# Patient Record
Sex: Male | Born: 1950
Health system: Southern US, Community
[De-identification: ages and names within clinical notes are randomized; demographics above are authoritative.]

## PROBLEM LIST (undated history)

## (undated) DIAGNOSIS — K219 Gastro-esophageal reflux disease without esophagitis: Secondary | ICD-10-CM

## (undated) DIAGNOSIS — M549 Dorsalgia, unspecified: Secondary | ICD-10-CM

## (undated) DIAGNOSIS — R3915 Urgency of urination: Secondary | ICD-10-CM

## (undated) DIAGNOSIS — I10 Essential (primary) hypertension: Secondary | ICD-10-CM

## (undated) DIAGNOSIS — E119 Type 2 diabetes mellitus without complications: Secondary | ICD-10-CM

## (undated) DIAGNOSIS — K759 Inflammatory liver disease, unspecified: Secondary | ICD-10-CM

## (undated) DIAGNOSIS — M2550 Pain in unspecified joint: Secondary | ICD-10-CM

## (undated) DIAGNOSIS — N400 Enlarged prostate without lower urinary tract symptoms: Secondary | ICD-10-CM

## (undated) DIAGNOSIS — R350 Frequency of micturition: Secondary | ICD-10-CM

## (undated) DIAGNOSIS — M1990 Unspecified osteoarthritis, unspecified site: Secondary | ICD-10-CM

## (undated) HISTORY — PX: APPENDECTOMY: SHX54

## (undated) HISTORY — PX: COLONOSCOPY: SHX174

## (undated) HISTORY — PX: VASECTOMY: SHX75

## (undated) NOTE — *Deleted (*Deleted)
Pharmacy Resident Rounding Note - for learning purposes only, not an active part of the chart   S/o  Admit Complaint: found down in bathroom, neg LOC -stopped home meds months ago except torsemide prn swelling -necrotic foot infx >> vanc + cefepime >> augmentin + doxy ---declines surgical tx -IV lasix and metolazone for LEE stopped, no improvement -started dialysis 11/3  Anticoagulation - apix >> IV hep for afib. bleeding from foley & central line - hep restarted 11/9 afternoon Infectious Disease - vanc + cefepime >> augmentin + doxy WBC 10>>12.5 Cardiovascular -SBP ok, ~100-120. HR variable but dips overnight. midodrine 10mg  TID Endocrinology - CBGs <180, SSI wm hs Gastrointestinal / Nutrition - some diarrhea 11/7, on augmentin and doxy  Neurology -trazodone Nephrology Admit SCr 1.98 >> 2 >> remains above 4. Now 6.1. 2L UF 1300 11/10 Baseline SCr 1.2-1.3. 11/8 UOP 0.61ml/kg/hr, ~245mL *foley is removed*  Lytes WNL, phos 9>>6.7, K <4   Pulmonary Hematology / Oncology PTA Medication Issues Best Practices  AKI on CKD stage IIIb, now dialysis dependent Hypotension & diuresis low uop >> foley removed  -HD continues (last session 11/10)  -Tunneled cath and AV fistula placement 11/10    Afib w/ RVR D/cd metop, heparin gtt off, back to eliquis --F/u with hgb after bleeding >> stable  No iron panel this admit - f/u on iron panel   Sepsis w/ osteo -cont abx at discharge, will continue outpatient   snf @ d/c

---

## 1982-11-15 HISTORY — PX: KNEE ARTHROSCOPY: SUR90

## 1991-11-16 HISTORY — PX: VASECTOMY REVERSAL: SHX243

## 2001-11-15 HISTORY — PX: SHOULDER ARTHROSCOPY: SHX128

## 2002-06-05 ENCOUNTER — Ambulatory Visit (HOSPITAL_COMMUNITY): Admission: RE | Admit: 2002-06-05 | Discharge: 2002-06-05 | Payer: Self-pay | Admitting: Sports Medicine

## 2002-06-05 ENCOUNTER — Encounter: Payer: Self-pay | Admitting: Sports Medicine

## 2002-06-05 ENCOUNTER — Ambulatory Visit (HOSPITAL_COMMUNITY): Admission: RE | Admit: 2002-06-05 | Discharge: 2002-06-05 | Payer: Self-pay | Admitting: Neurosurgery

## 2013-02-23 ENCOUNTER — Other Ambulatory Visit: Payer: Self-pay | Admitting: Orthopedic Surgery

## 2013-03-06 ENCOUNTER — Encounter (HOSPITAL_BASED_OUTPATIENT_CLINIC_OR_DEPARTMENT_OTHER): Payer: Self-pay | Admitting: *Deleted
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
## 2013-03-07 ENCOUNTER — Encounter (HOSPITAL_BASED_OUTPATIENT_CLINIC_OR_DEPARTMENT_OTHER)
Admission: RE | Admit: 2013-03-07 | Discharge: 2013-03-07 | Disposition: A | Discharge status: Home / Self Care | Payer: Worker's Compensation | Source: Ambulatory Visit | Attending: Orthopedic Surgery | Admitting: Orthopedic Surgery

## 2013-03-07 ENCOUNTER — Other Ambulatory Visit: Payer: Self-pay

## 2013-03-07 LAB — BASIC METABOLIC PANEL WITH GFR
BUN: 15 mg/dL (ref 6–23)
CO2: 29 meq/L (ref 19–32)
Calcium: 8.8 mg/dL (ref 8.4–10.5)
Chloride: 97 meq/L (ref 96–112)
Creatinine, Ser: 0.81 mg/dL (ref 0.50–1.35)
GFR calc Af Amer: >90 mL/min
GFR calc non Af Amer: >90 mL/min
Glucose, Bld: 321 mg/dL — ABNORMAL HIGH (ref 70–99)
Potassium: 3.7 meq/L (ref 3.5–5.1)
Sodium: 136 meq/L (ref 135–145)
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
## 2013-03-13 ENCOUNTER — Encounter (HOSPITAL_BASED_OUTPATIENT_CLINIC_OR_DEPARTMENT_OTHER): Payer: Self-pay

## 2013-03-13 ENCOUNTER — Encounter (HOSPITAL_BASED_OUTPATIENT_CLINIC_OR_DEPARTMENT_OTHER)
Admission: RE | Disposition: A | Discharge status: Home / Self Care | Payer: Self-pay | Source: Ambulatory Visit | Attending: Orthopedic Surgery

## 2013-03-13 ENCOUNTER — Ambulatory Visit (HOSPITAL_BASED_OUTPATIENT_CLINIC_OR_DEPARTMENT_OTHER): Payer: Worker's Compensation | Admitting: Certified Registered Nurse Anesthetist

## 2013-03-13 ENCOUNTER — Encounter (HOSPITAL_BASED_OUTPATIENT_CLINIC_OR_DEPARTMENT_OTHER): Payer: Self-pay | Admitting: Certified Registered Nurse Anesthetist

## 2013-03-13 ENCOUNTER — Ambulatory Visit (HOSPITAL_BASED_OUTPATIENT_CLINIC_OR_DEPARTMENT_OTHER)
Admission: RE | Admit: 2013-03-13 | Discharge: 2013-03-14 | Disposition: A | Discharge status: Home / Self Care | Payer: Worker's Compensation | Source: Ambulatory Visit | Attending: Orthopedic Surgery | Admitting: Orthopedic Surgery

## 2013-03-13 DIAGNOSIS — M67919 Unspecified disorder of synovium and tendon, unspecified shoulder: Secondary | ICD-10-CM | POA: Insufficient documentation

## 2013-03-13 DIAGNOSIS — M719 Bursopathy, unspecified: Secondary | ICD-10-CM | POA: Insufficient documentation

## 2013-03-13 DIAGNOSIS — I10 Essential (primary) hypertension: Secondary | ICD-10-CM | POA: Insufficient documentation

## 2013-03-13 DIAGNOSIS — M129 Arthropathy, unspecified: Secondary | ICD-10-CM | POA: Insufficient documentation

## 2013-03-13 DIAGNOSIS — M24819 Other specific joint derangements of unspecified shoulder, not elsewhere classified: Secondary | ICD-10-CM | POA: Insufficient documentation

## 2013-03-13 HISTORY — PX: SHOULDER ARTHROSCOPY WITH ROTATOR CUFF REPAIR AND SUBACROMIAL DECOMPRESSION: SHX5686

## 2013-03-13 HISTORY — DX: Unspecified osteoarthritis, unspecified site: M19.90

## 2013-03-13 HISTORY — DX: Essential (primary) hypertension: I10

## 2013-03-13 HISTORY — DX: Gastro-esophageal reflux disease without esophagitis: K21.9

## 2013-03-13 HISTORY — DX: Benign prostatic hyperplasia without lower urinary tract symptoms: N40.0

## 2013-03-13 LAB — GLUCOSE, CAPILLARY
Glucose-Capillary: 211 mg/dL — ABNORMAL HIGH (ref 70–99)
Glucose-Capillary: 192 mg/dL — ABNORMAL HIGH (ref 70–99)

## 2013-03-13 LAB — POCT HEMOGLOBIN-HEMACUE: Hemoglobin: 14.3 g/dL (ref 13.0–17.0)

## 2013-03-13 SURGERY — SHOULDER ARTHROSCOPY WITH ROTATOR CUFF REPAIR AND SUBACROMIAL DECOMPRESSION
Anesthesia: General | Site: Shoulder | Laterality: Left | Wound class: Clean

## 2013-03-13 MED ORDER — ONDANSETRON HCL 4 MG/2ML IJ SOLN: 4.0000 mg | Freq: Once | INTRAMUSCULAR | Status: DC | PRN

## 2013-03-13 MED ORDER — HYDROMORPHONE HCL PF 1 MG/ML IJ SOLN: 0.2500 mg | INTRAMUSCULAR | Status: DC | PRN

## 2013-03-13 MED ORDER — METOCLOPRAMIDE HCL 5 MG PO TABS: 5.0000 mg | ORAL_TABLET | Freq: Three times a day (TID) | ORAL | Status: DC | PRN

## 2013-03-13 MED ORDER — IBUPROFEN 600 MG PO TABS
600.0000 mg | ORAL_TABLET | Freq: Four times a day (QID) | ORAL | Status: DC | PRN
  Administered 2013-03-14: 600 mg via ORAL

## 2013-03-13 MED ORDER — LIDOCAINE HCL (CARDIAC) 20 MG/ML IV SOLN
INTRAVENOUS | Status: DC | PRN
  Administered 2013-03-13: 80 mg via INTRAVENOUS

## 2013-03-13 MED ORDER — ONDANSETRON HCL 4 MG PO TABS: 4.0000 mg | ORAL_TABLET | Freq: Four times a day (QID) | ORAL | Status: DC | PRN

## 2013-03-13 MED ORDER — HYDROMORPHONE HCL 4 MG PO TABS
4.0000 mg | ORAL_TABLET | Freq: Four times a day (QID) | ORAL | Status: DC | PRN
  Administered 2013-03-13 – 2013-03-14 (×3): 4 mg via ORAL

## 2013-03-13 MED ORDER — ONDANSETRON HCL 4 MG/2ML IJ SOLN: 4.0000 mg | Freq: Four times a day (QID) | INTRAMUSCULAR | Status: DC | PRN

## 2013-03-13 MED ORDER — EPINEPHRINE HCL 0.1 MG/ML IJ SOSY
PREFILLED_SYRINGE | INTRAMUSCULAR | Status: DC | PRN
  Administered 2013-03-13: .15 mL via SUBCUTANEOUS

## 2013-03-13 MED ORDER — ONDANSETRON HCL 4 MG/2ML IJ SOLN
INTRAMUSCULAR | Status: DC | PRN
  Administered 2013-03-13: 4 mg via INTRAVENOUS

## 2013-03-13 MED ORDER — ROPIVACAINE HCL 5 MG/ML IJ SOLN
INTRAMUSCULAR | Status: DC | PRN
  Administered 2013-03-13: 12 mL

## 2013-03-13 MED ORDER — CEFAZOLIN SODIUM-DEXTROSE 2-3 GM-% IV SOLR
2.0000 g | INTRAVENOUS | Status: AC
  Administered 2013-03-13: 3 g via INTRAVENOUS

## 2013-03-13 MED ORDER — FENTANYL CITRATE 0.05 MG/ML IJ SOLN
INTRAMUSCULAR | Status: DC | PRN
  Administered 2013-03-13 (×6): 25 ug via INTRAVENOUS
  Administered 2013-03-13: 50 ug via INTRAVENOUS

## 2013-03-13 MED ORDER — OXYCODONE HCL 5 MG PO TABS: 5.0000 mg | ORAL_TABLET | Freq: Once | ORAL | Status: DC | PRN

## 2013-03-13 MED ORDER — HYDROMORPHONE HCL PF 1 MG/ML IJ SOLN
0.5000 mg | INTRAMUSCULAR | Status: DC | PRN
  Administered 2013-03-13: 1 mg via INTRAVENOUS

## 2013-03-13 MED ORDER — OXYCODONE HCL 5 MG/5ML PO SOLN: 5.0000 mg | Freq: Once | ORAL | Status: DC | PRN

## 2013-03-13 MED ORDER — LACTATED RINGERS IV SOLN
INTRAVENOUS | Status: DC
  Administered 2013-03-13 (×2): via INTRAVENOUS

## 2013-03-13 MED ORDER — METHOCARBAMOL 500 MG PO TABS
500.0000 mg | ORAL_TABLET | Freq: Four times a day (QID) | ORAL | Status: DC | PRN
  Administered 2013-03-13 – 2013-03-14 (×2): 500 mg via ORAL

## 2013-03-13 MED ORDER — MIDAZOLAM HCL 2 MG/2ML IJ SOLN
1.0000 mg | INTRAMUSCULAR | Status: DC | PRN
  Administered 2013-03-13: 2 mg via INTRAVENOUS

## 2013-03-13 MED ORDER — LIDOCAINE HCL 4 % MT SOLN
OROMUCOSAL | Status: DC | PRN
  Administered 2013-03-13: 4 mL via TOPICAL

## 2013-03-13 MED ORDER — FENTANYL CITRATE 0.05 MG/ML IJ SOLN
50.0000 ug | INTRAMUSCULAR | Status: DC | PRN
  Administered 2013-03-13: 100 ug via INTRAVENOUS

## 2013-03-13 MED ORDER — SODIUM CHLORIDE 0.9 % IR SOLN
Status: DC | PRN
  Administered 2013-03-13: 13000 mL

## 2013-03-13 MED ORDER — CHLORHEXIDINE GLUCONATE 4 % EX LIQD: 60.0000 mL | Freq: Once | CUTANEOUS | Status: DC

## 2013-03-13 MED ORDER — PROPOFOL 10 MG/ML IV BOLUS
INTRAVENOUS | Status: DC | PRN
  Administered 2013-03-13: 160 mg via INTRAVENOUS

## 2013-03-13 MED ORDER — SUCCINYLCHOLINE CHLORIDE 20 MG/ML IJ SOLN
INTRAMUSCULAR | Status: DC | PRN
  Administered 2013-03-13: 140 mg via INTRAVENOUS

## 2013-03-13 MED ORDER — METHOCARBAMOL 100 MG/ML IJ SOLN: 500.0000 mg | Freq: Four times a day (QID) | INTRAVENOUS | Status: DC | PRN

## 2013-03-13 MED ORDER — CEFAZOLIN SODIUM-DEXTROSE 2-3 GM-% IV SOLR
2.0000 g | Freq: Four times a day (QID) | INTRAVENOUS | Status: AC
  Administered 2013-03-13 – 2013-03-14 (×3): 2 g via INTRAVENOUS

## 2013-03-13 MED ORDER — ROPIVACAINE HCL 5 MG/ML IJ SOLN
INTRAMUSCULAR | Status: DC | PRN
  Administered 2013-03-13: 30 mL

## 2013-03-13 MED ORDER — METOCLOPRAMIDE HCL 5 MG/ML IJ SOLN: 5.0000 mg | Freq: Three times a day (TID) | INTRAMUSCULAR | Status: DC | PRN

## 2013-03-13 MED ORDER — SODIUM CHLORIDE 0.9 % IV SOLN
INTRAVENOUS | Status: DC
  Administered 2013-03-13: 20 mL/h via INTRAVENOUS

## 2013-03-13 SURGICAL SUPPLY — 82 items
ANCHOR BIO SWLOCK 4.75 W/TIG (Anchor) ×2 IMPLANT
ANCHOR PUSHLOCK PEEK 3.5X19.5 (Anchor) ×2 IMPLANT
ANCHOR SUT BIO SW 4.75X19.1 (Anchor) ×4 IMPLANT
BANDAGE ADHESIVE 1X3 (GAUZE/BANDAGES/DRESSINGS) IMPLANT
BLADE AVERAGE 25X9 (BLADE) ×2 IMPLANT
BLADE CUTTER MENIS 5.5 (BLADE) IMPLANT
BLADE SURG 15 STRL LF DISP TIS (BLADE) ×3 IMPLANT
BLADE SURG 15 STRL SS (BLADE) ×3
BNDG COHESIVE 4X5 TAN STRL (GAUZE/BANDAGES/DRESSINGS) ×2 IMPLANT
BUR EGG 3PK/BX (BURR) ×2 IMPLANT
BUR OVAL 6.0 (BURR) ×2 IMPLANT
CANISTER OMNI JUG 16 LITER (MISCELLANEOUS) ×2 IMPLANT
CANISTER SUCTION 2500CC (MISCELLANEOUS) ×2 IMPLANT
CANNULA TWIST IN 8.25X7CM (CANNULA) IMPLANT
CLEANER CAUTERY TIP 5X5 PAD (MISCELLANEOUS) IMPLANT
CLOTH BEACON ORANGE TIMEOUT ST (SAFETY) ×2 IMPLANT
CUTTER MENISCUS  4.2MM (BLADE) ×1
CUTTER MENISCUS 4.2MM (BLADE) ×1 IMPLANT
DECANTER SPIKE VIAL GLASS SM (MISCELLANEOUS) IMPLANT
DRAPE INCISE IOBAN 66X45 STRL (DRAPES) ×2 IMPLANT
DRAPE STERI 35X30 U-POUCH (DRAPES) ×4 IMPLANT
DRAPE SURG 17X23 STRL (DRAPES) ×4 IMPLANT
DRAPE U-SHAPE 47X51 STRL (DRAPES) ×4 IMPLANT
DRAPE U-SHAPE 76X120 STRL (DRAPES) ×4 IMPLANT
DRSG PAD ABDOMINAL 8X10 ST (GAUZE/BANDAGES/DRESSINGS) ×2 IMPLANT
DURAPREP 26ML APPLICATOR (WOUND CARE) ×6 IMPLANT
ELECT REM PT RETURN 9FT ADLT (ELECTROSURGICAL) ×2
ELECTRODE REM PT RTRN 9FT ADLT (ELECTROSURGICAL) ×1 IMPLANT
GLOVE BIO SURGEON STRL SZ 6.5 (GLOVE) ×8 IMPLANT
GLOVE BIOGEL M STRL SZ7.5 (GLOVE) ×4 IMPLANT
GLOVE BIOGEL PI IND STRL 7.0 (GLOVE) ×2 IMPLANT
GLOVE BIOGEL PI IND STRL 8 (GLOVE) ×4 IMPLANT
GLOVE BIOGEL PI INDICATOR 7.0 (GLOVE) ×2
GLOVE BIOGEL PI INDICATOR 8 (GLOVE) ×4
GLOVE EXAM NITRILE EXT CFF LRG (GLOVE) ×2 IMPLANT
GLOVE ORTHO TXT STRL SZ7.5 (GLOVE) ×4 IMPLANT
GOWN BRE IMP PREV XXLGXLNG (GOWN DISPOSABLE) ×4 IMPLANT
GOWN STRL REIN 2XL XLG LVL4 (GOWN DISPOSABLE) ×4 IMPLANT
NDL SUT 6 .5 CRC .975X.05 MAYO (NEEDLE) ×1 IMPLANT
NEEDLE MAYO TAPER (NEEDLE) ×1
NEEDLE MINI RC 24MM (NEEDLE) IMPLANT
NEEDLE SCORPION (NEEDLE) ×2 IMPLANT
PACK ARTHROSCOPY DSU (CUSTOM PROCEDURE TRAY) ×2 IMPLANT
PACK BASIN DAY SURGERY FS (CUSTOM PROCEDURE TRAY) ×2 IMPLANT
PAD CLEANER CAUTERY TIP 5X5 (MISCELLANEOUS)
PASSER SUT SWANSON 36MM LOOP (INSTRUMENTS) IMPLANT
PENCIL BUTTON HOLSTER BLD 10FT (ELECTRODE) ×2 IMPLANT
SLEEVE SCD COMPRESS KNEE MED (MISCELLANEOUS) ×2 IMPLANT
SLING ARM FOAM STRAP LRG (SOFTGOODS) IMPLANT
SLING ARM FOAM STRAP MED (SOFTGOODS) IMPLANT
SPONGE GAUZE 4X4 12PLY (GAUZE/BANDAGES/DRESSINGS) ×2 IMPLANT
SPONGE LAP 4X18 X RAY DECT (DISPOSABLE) ×6 IMPLANT
STOCKINETTE IMPERVIOUS LG (DRAPES) ×2 IMPLANT
STRIP CLOSURE SKIN 1/2X4 (GAUZE/BANDAGES/DRESSINGS) ×2 IMPLANT
SUCTION FRAZIER TIP 10 FR DISP (SUCTIONS) IMPLANT
SUT ETHIBOND 2 OS 4 DA (SUTURE) IMPLANT
SUT ETHILON 4 0 PS 2 18 (SUTURE) IMPLANT
SUT FIBERWIRE #2 38 T-5 BLUE (SUTURE) ×4
SUT FIBERWIRE 3-0 18 TAPR NDL (SUTURE)
SUT PROLENE 1 CT (SUTURE) IMPLANT
SUT PROLENE 3 0 PS 2 (SUTURE) ×4 IMPLANT
SUT TIGER TAPE 7 IN WHITE (SUTURE) IMPLANT
SUT VIC AB 0 CT1 27 (SUTURE) ×2
SUT VIC AB 0 CT1 27XBRD ANBCTR (SUTURE) ×2 IMPLANT
SUT VIC AB 0 SH 27 (SUTURE) IMPLANT
SUT VIC AB 2-0 SH 27 (SUTURE)
SUT VIC AB 2-0 SH 27XBRD (SUTURE) IMPLANT
SUT VIC AB 3-0 SH 27 (SUTURE) ×2
SUT VIC AB 3-0 SH 27X BRD (SUTURE) ×2 IMPLANT
SUT VIC AB 3-0 X1 27 (SUTURE) IMPLANT
SUTURE FIBERWR #2 38 T-5 BLUE (SUTURE) ×2 IMPLANT
SUTURE FIBERWR 3-0 18 TAPR NDL (SUTURE) IMPLANT
SYR 3ML 23GX1 SAFETY (SYRINGE) IMPLANT
SYR BULB 3OZ (MISCELLANEOUS) IMPLANT
TAPE FIBER 2MM 7IN #2 BLUE (SUTURE) ×2 IMPLANT
TAPE PAPER 3X10 WHT MICROPORE (GAUZE/BANDAGES/DRESSINGS) ×2 IMPLANT
TOWEL OR 17X24 6PK STRL BLUE (TOWEL DISPOSABLE) ×2 IMPLANT
TUBE CONNECTING 20X1/4 (TUBING) ×2 IMPLANT
TUBING ARTHROSCOPY IRRIG 16FT (MISCELLANEOUS) ×2 IMPLANT
WAND STAR VAC 90 (SURGICAL WAND) ×2 IMPLANT
WATER STERILE IRR 1000ML POUR (IV SOLUTION) ×2 IMPLANT
YANKAUER SUCT BULB TIP NO VENT (SUCTIONS) IMPLANT

## 2013-03-13 NOTE — Anesthesia Preprocedure Evaluation (Addendum)
Anesthesia Evaluation  Patient identified by MRN, date of birth, ID band Patient awake    Reviewed: Allergy & Precautions, H&P , NPO status , Patient's Chart, lab work & pertinent test results  Airway Mallampati: II TM Distance: >3 FB Neck ROM: Full    Dental  (+) Teeth Intact and Dental Advisory Given   Pulmonary  breath sounds clear to auscultation        Cardiovascular hypertension, Pt. on medications Rhythm:Regular Rate:Normal     Neuro/Psych    GI/Hepatic GERD-  Medicated and Controlled,  Endo/Other  Morbid obesity  Renal/GU      Musculoskeletal   Abdominal   Peds  Hematology   Anesthesia Other Findings   Reproductive/Obstetrics                          Anesthesia Physical Anesthesia Plan  ASA: III  Anesthesia Plan: General   Post-op Pain Management:    Induction: Intravenous  Airway Management Planned: Oral ETT  Additional Equipment:   Intra-op Plan:   Post-operative Plan: Extubation in OR  Informed Consent: I have reviewed the patients History and Physical, chart, labs and discussed the procedure including the risks, benefits and alternatives for the proposed anesthesia with the patient or authorized representative who has indicated his/her understanding and acceptance.   Dental advisory given  Plan Discussed with: CRNA, Anesthesiologist and Surgeon  Anesthesia Plan Comments:         Anesthesia Quick Evaluation

## 2013-03-13 NOTE — Progress Notes (Signed)
Dr. Al Corpus notified of occasional PVC's /irregular rhythm- EKG strip printed. Dr. Al Corpus  to PACU to review EKG strip- will continue to monitor pt. as per Dr. Al Corpus. Pt. with VSS at present.

## 2013-03-13 NOTE — Transfer of Care (Signed)
Immediate Anesthesia Transfer of Care Note  Patient: [name]  Procedure(s) Performed: Procedure(s): LEFT SHOULDER ARTHROSCOPY WITH SUBACROMIAL DECOMPRESSION, DISTAL CLAVICLE RESECTION AND REPAIR ROTATOR CUFF AND LABRAL DEBRIDEMENT (Left)  Patient Location: PACU  Anesthesia Type:GA combined with regional for post-op pain  Level of Consciousness: sedated and patient cooperative  Airway & Oxygen Therapy: Patient Spontanous Breathing and Patient connected to face mask oxygen  Post-op Assessment: Report given to PACU RN and Post -op Vital signs reviewed and stable  Post vital signs: Reviewed and stable  Complications: No apparent anesthesia complications

## 2013-03-13 NOTE — OR Nursing (Signed)
Dr. Daylene Katayama unable to ascess joint with current draping.  Patient reprepped and draped at 1110am.  When reprepping, saw bluish capillary petechiae on underside of left upper arm.  Dr. Daylene Katayama aware and noted it was probably from the 6650 steri- drape.

## 2013-03-13 NOTE — Anesthesia Procedure Notes (Addendum)
Anesthesia Regional Block:  Supraclavicular block  Pre-Anesthetic Checklist: ,, timeout performed, Correct Patient, Correct Site, Correct Laterality, Correct Procedure, Correct Position, site marked, Risks and benefits discussed,  Surgical consent,  Pre-op evaluation,  At surgeon's request and post-op pain management  Laterality: Left and Upper  Prep: chloraprep       Needles:  Injection technique: Single-shot  Needle Type: Echogenic Stimulator Needle     Needle Length: 5cm 5 cm Needle Gauge: 21    Additional Needles:  Procedures: ultrasound guided (picture in chart) Supraclavicular block Narrative:  Start time: 03/13/2013 9:56 AM End time: 03/13/2013 10:06 AM Injection made incrementally with aspirations every 5 mL.  Performed by: Personally  Anesthesiologist: Lorrene Reid  Supraclavicular block Procedure Name: Intubation Date/Time: 03/13/2013 10:44 AM Performed by: Denny Levy Pre-anesthesia Checklist: Patient identified, Emergency Drugs available, Suction available, Patient being monitored and Timeout performed Patient Re-evaluated:Patient Re-evaluated prior to inductionOxygen Delivery Method: Circle System Utilized Preoxygenation: Pre-oxygenation with 100% oxygen Intubation Type: IV induction Ventilation: Mask ventilation without difficulty Laryngoscope Size: Miller and 3 Grade View: Grade II Tube type: Oral Tube size: 8.0 mm Number of attempts: 2 (more curve needed with stylet) Airway Equipment and Method: stylet and oral airway Placement Confirmation: ETT inserted through vocal cords under direct vision,  positive ETCO2 and breath sounds checked- equal and bilateral Secured at: 21 cm Tube secured with: Tape Dental Injury: Teeth and Oropharynx as per pre-operative assessment

## 2013-03-13 NOTE — Anesthesia Postprocedure Evaluation (Signed)
  Anesthesia Post-op Note  Patient: [name]  Procedure(s) Performed: Procedure(s): LEFT SHOULDER ARTHROSCOPY WITH SUBACROMIAL DECOMPRESSION, DISTAL CLAVICLE RESECTION AND REPAIR ROTATOR CUFF AND LABRAL DEBRIDEMENT (Left)  Patient Location: PACU  Anesthesia Type:GA combined with regional for post-op pain  Level of Consciousness: awake, alert  and oriented  Airway and Oxygen Therapy: Patient Spontanous Breathing and Patient connected to face mask oxygen  Post-op Pain: none  Post-op Assessment: Post-op Vital signs reviewed  Post-op Vital Signs: Reviewed  Complications: No apparent anesthesia complications

## 2013-03-13 NOTE — Progress Notes (Signed)
Assisted Dr. Crews with left, ultrasound guided, interscalene  block. Side rails up, monitors on throughout procedure. See vital signs in flow sheet. Tolerated Procedure well. 

## 2013-03-13 NOTE — Brief Op Note (Signed)
03/13/2013  1:56 PM  PATIENT:  [name]  62 y.o. male  PRE-OPERATIVE DIAGNOSIS:  LEFT ROTATOR CUFF TEAR A-C DEGENERATIVE JOINT DISEASE  POST-OPERATIVE DIAGNOSIS:  LEFT ROTATOR CUFF TEAR A-C DEGENERATIVE JOINT DISEASE  PROCEDURE:  Procedure(s): LEFT SHOULDER ARTHROSCOPY WITH SUBACROMIAL DECOMPRESSION, DISTAL CLAVICLE RESECTION AND REPAIR ROTATOR CUFF AND LABRAL DEBRIDEMENT (Left)  SURGEON:  Surgeon(s) and Role:    * [name]., MD - Primary  PHYSICIAN ASSISTANT:   ASSISTANTS:Neeti Knudtson Dasnoit,P.A-C   ANESTHESIA:   general  EBL:  Total I/O In: 1000 [I.V.:1000] Out: -   BLOOD ADMINISTERED:none  DRAINS: none   LOCAL MEDICATIONS USED: ropivacaine suprascapular nerve block and wound margin block  SPECIMEN:  No Specimen  DISPOSITION OF SPECIMEN:  N/A  COUNTS:  YES  TOURNIQUET:  * No tourniquets in log *  DICTATION: .Other Dictation: Dictation Number [id number]  PLAN OF CARE: Admit for overnight observation  PATIENT DISPOSITION:  PACU - hemodynamically stable.   Delay start of Pharmacological VTE agent (>24hrs) due to surgical blood loss or risk of bleeding: not applicable

## 2013-03-14 ENCOUNTER — Encounter (HOSPITAL_BASED_OUTPATIENT_CLINIC_OR_DEPARTMENT_OTHER): Payer: Self-pay | Admitting: Orthopedic Surgery
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
## 2013-03-19 DIAGNOSIS — E119 Type 2 diabetes mellitus without complications: Secondary | ICD-10-CM | POA: Insufficient documentation

## 2013-03-19 DIAGNOSIS — I10 Essential (primary) hypertension: Secondary | ICD-10-CM | POA: Insufficient documentation

## 2013-04-24 ENCOUNTER — Telehealth: Payer: Self-pay | Admitting: General Practice

## 2013-04-25 MED ORDER — LOSARTAN POTASSIUM 100 MG PO TABS: 100.0000 mg | ORAL_TABLET | Freq: Every day | ORAL | Status: DC

## 2013-04-25 MED ORDER — HYDROCHLOROTHIAZIDE 25 MG PO TABS: 25.0000 mg | ORAL_TABLET | Freq: Every day | ORAL | Status: DC

## 2013-04-25 NOTE — Telephone Encounter (Signed)
Rx Refilled  

## 2013-07-26 ENCOUNTER — Other Ambulatory Visit: Payer: Self-pay | Admitting: Family Medicine

## 2013-07-26 NOTE — Telephone Encounter (Signed)
Called patient.  Has not been in office in well over one year.  Did not want to make appointment at this time.  Also stated was not out of his medication yet.  Refills denied pending appointment.  Patient aware of this.

## 2013-09-27 ENCOUNTER — Encounter: Payer: Self-pay | Admitting: Family Medicine

## 2013-09-27 ENCOUNTER — Other Ambulatory Visit: Payer: Self-pay | Admitting: Family Medicine

## 2013-12-03 ENCOUNTER — Other Ambulatory Visit: Payer: Self-pay | Admitting: Family Medicine

## 2013-12-17 DIAGNOSIS — N529 Male erectile dysfunction, unspecified: Secondary | ICD-10-CM | POA: Insufficient documentation

## 2013-12-17 DIAGNOSIS — R6882 Decreased libido: Secondary | ICD-10-CM | POA: Insufficient documentation

## 2013-12-17 DIAGNOSIS — R972 Elevated prostate specific antigen [PSA]: Secondary | ICD-10-CM | POA: Insufficient documentation

## 2013-12-17 DIAGNOSIS — N320 Bladder-neck obstruction: Secondary | ICD-10-CM | POA: Insufficient documentation

## 2014-02-13 ENCOUNTER — Other Ambulatory Visit: Payer: Self-pay | Admitting: Family Medicine

## 2014-02-13 ENCOUNTER — Encounter: Payer: Self-pay | Admitting: Family Medicine

## 2014-06-17 DIAGNOSIS — N400 Enlarged prostate without lower urinary tract symptoms: Secondary | ICD-10-CM | POA: Insufficient documentation

## 2014-08-27 ENCOUNTER — Other Ambulatory Visit: Payer: Self-pay | Admitting: Family Medicine

## 2014-08-27 ENCOUNTER — Ambulatory Visit
Admission: RE | Admit: 2014-08-27 | Discharge: 2014-08-27 | Disposition: A | Discharge status: Home / Self Care | Payer: 59 | Source: Ambulatory Visit | Attending: Family Medicine | Admitting: Family Medicine

## 2014-08-27 DIAGNOSIS — I10 Essential (primary) hypertension: Secondary | ICD-10-CM

## 2015-01-14 ENCOUNTER — Other Ambulatory Visit: Payer: Self-pay | Admitting: Physician Assistant

## 2015-01-14 NOTE — H&P (Signed)
TOTAL HIP ADMISSION H&P  Patient is admitted for right total hip arthroplasty.  Subjective:  Chief Complaint: right hip pain  HPI: [name], 64 y.o. male, has a history of pain and functional disability in the right hip(s) due to arthritis and patient has failed non-surgical conservative treatments for greater than 12 weeks to include NSAID's and/or analgesics, corticosteriod injections and activity modification.  Onset of symptoms was gradual starting 5 years ago with rapidlly worsening course since that time.The patient noted no past surgery on the right hip(s).  Patient currently rates pain in the right hip at 6 out of 10 with activity. Patient has night pain, worsening of pain with activity and weight bearing, trendelenberg gait, pain that interfers with activities of daily living and pain with passive range of motion. Patient has evidence of subchondral sclerosis and joint space narrowing by imaging studies. This condition presents safety issues increasing the risk of falls.  There is no current active infection.  There are no active problems to display for this patient.  Past Medical History  Diagnosis Date  . Shortness of breath     DOE  . Hypertension   . BPH (benign prostatic hypertrophy)   . Arthritis   . Complication of anesthesia     had SOB post op shoulder 2003  . GERD (gastroesophageal reflux disease)     no meds  . Snores     Past Surgical History  Procedure Laterality Date  . Appendectomy      age 34  . Knee arthroscopy  1984    right and left   . Vasectomy    . Vasectomy reversal  1993  . Colonoscopy    . Shoulder arthroscopy  2003    right RCR-GSC-stayed overnight-had SOB  . Shoulder arthroscopy with rotator cuff repair and subacromial decompression Left 03/13/2013    Procedure: LEFT SHOULDER ARTHROSCOPY WITH SUBACROMIAL DECOMPRESSION, DISTAL CLAVICLE RESECTION AND REPAIR ROTATOR CUFF AND LABRAL DEBRIDEMENT;  Surgeon: [name]., MD;  Location:  [facility];  Service: Orthopedics;  Laterality: Left;     (Not in a hospital admission) Allergies  Allergen Reactions  . Codeine Swelling    History  Substance Use Topics  . Smoking status: Former Smoker    Quit date: 03/06/1958  . Smokeless tobacco: Not on file  . Alcohol Use: Yes     Comment: occ    No family history on file.   Review of Systems  Constitutional: Negative.   HENT: Negative.   Eyes: Negative.   Respiratory: Negative.   Cardiovascular: Negative.   Gastrointestinal: Negative.   Genitourinary: Negative.   Musculoskeletal: Positive for joint pain.  Skin: Negative.   Neurological: Negative.   Endo/Heme/Allergies: Negative.   Psychiatric/Behavioral: Negative.     Objective:  Physical Exam  Constitutional: He is oriented to person, place, and time. He appears well-developed and well-nourished.  HENT:  Head: Normocephalic and atraumatic.  Eyes: EOM are normal. Pupils are equal, round, and reactive to light.  Neck: Normal range of motion. Neck supple.  Cardiovascular: Normal rate, regular rhythm and normal heart sounds.   Respiratory: Effort normal and breath sounds normal. No respiratory distress. He has no wheezes. He has no rales.  GI: Soft. Bowel sounds are normal. He exhibits no distension.  Musculoskeletal:  Antalgic Trendelenburg gait on the right. Positive logroll.  Negative straight leg raise.  Neurovascularly intact distally.    Neurological: He is alert and oriented to person, place, and time.  Skin: Skin is warm and dry.  Psychiatric: He has a normal mood and affect. His behavior is normal. Judgment and thought content normal.    Vital signs in last 24 hours: @VSRANGES @  Labs:   Estimated body mass index is 44.12 kg/(m^2) as calculated from the following:   Height as of 03/13/13: 5\' 10"  (1.778 m).   Weight as of 03/13/13: 139.481 kg (307 lb 8 oz).   Imaging Review Plain radiographs demonstrate severe degenerative joint  disease of the right hip(s). The bone quality appears to be fair for age and reported activity level.  Assessment/Plan:  End stage arthritis, right hip(s)  The patient history, physical examination, clinical judgement of the provider and imaging studies are consistent with end stage degenerative joint disease of the right hip(s) and total hip arthroplasty is deemed medically necessary. The treatment options including medical management, injection therapy, arthroscopy and arthroplasty were discussed at length. The risks and benefits of total hip arthroplasty were presented and reviewed. The risks due to aseptic loosening, infection, stiffness, dislocation/subluxation,  thromboembolic complications and other imponderables were discussed.  The patient acknowledged the explanation, agreed to proceed with the plan and consent was signed. Patient is being admitted for inpatient treatment for surgery, pain control, PT, OT, prophylactic antibiotics, VTE prophylaxis, progressive ambulation and ADL's and discharge planning.The patient is planning to be discharged home with home health services

## 2015-01-17 ENCOUNTER — Encounter (HOSPITAL_COMMUNITY)
Admission: RE | Admit: 2015-01-17 | Discharge: 2015-01-17 | Disposition: A | Discharge status: Home / Self Care | Payer: 59 | Source: Ambulatory Visit | Attending: Orthopedic Surgery | Admitting: Orthopedic Surgery

## 2015-01-17 ENCOUNTER — Encounter (HOSPITAL_COMMUNITY): Payer: Self-pay

## 2015-01-17 DIAGNOSIS — Z01818 Encounter for other preprocedural examination: Secondary | ICD-10-CM | POA: Insufficient documentation

## 2015-01-17 DIAGNOSIS — Z0183 Encounter for blood typing: Secondary | ICD-10-CM | POA: Diagnosis not present

## 2015-01-17 DIAGNOSIS — M1611 Unilateral primary osteoarthritis, right hip: Secondary | ICD-10-CM | POA: Diagnosis not present

## 2015-01-17 DIAGNOSIS — I10 Essential (primary) hypertension: Secondary | ICD-10-CM | POA: Insufficient documentation

## 2015-01-17 DIAGNOSIS — Z01812 Encounter for preprocedural laboratory examination: Secondary | ICD-10-CM | POA: Diagnosis not present

## 2015-01-17 DIAGNOSIS — K219 Gastro-esophageal reflux disease without esophagitis: Secondary | ICD-10-CM | POA: Insufficient documentation

## 2015-01-17 HISTORY — DX: Pain in unspecified joint: M25.50

## 2015-01-17 HISTORY — DX: Inflammatory liver disease, unspecified: K75.9

## 2015-01-17 HISTORY — DX: Urgency of urination: R39.15

## 2015-01-17 HISTORY — DX: Frequency of micturition: R35.0

## 2015-01-17 HISTORY — DX: Dorsalgia, unspecified: M54.9

## 2015-01-17 HISTORY — DX: Type 2 diabetes mellitus without complications: E11.9

## 2015-01-17 LAB — COMPREHENSIVE METABOLIC PANEL
ALT: 32 U/L (ref 0–53)
AST: 28 U/L (ref 0–37)
Albumin: 3.9 g/dL (ref 3.5–5.2)
Alkaline Phosphatase: 73 U/L (ref 39–117)
Anion gap: 8 (ref 5–15)
BUN: 19 mg/dL (ref 6–23)
CO2: 27 mmol/L (ref 19–32)
Calcium: 8.8 mg/dL (ref 8.4–10.5)
Chloride: 103 mmol/L (ref 96–112)
Creatinine, Ser: 0.96 mg/dL (ref 0.50–1.35)
GFR calc Af Amer: >90 mL/min (ref >90)
GFR calc non Af Amer: 85 mL/min — ABNORMAL LOW (ref >90)
Glucose, Bld: 142 mg/dL — ABNORMAL HIGH (ref 70–99)
Potassium: 3.7 mmol/L (ref 3.5–5.1)
Sodium: 138 mmol/L (ref 135–145)
Total Bilirubin: 1 mg/dL (ref 0.3–1.2)
Total Protein: 6.9 g/dL (ref 6.0–8.3)

## 2015-01-17 LAB — PROTIME-INR
INR: 0.99 (ref 0.00–1.49)
Prothrombin Time: 13.2 seconds (ref 11.6–15.2)

## 2015-01-17 LAB — CBC WITH DIFFERENTIAL/PLATELET
Basophils Absolute: 0 10*3/uL (ref 0.0–0.1)
Basophils Relative: 0 % (ref 0–1)
Eosinophils Absolute: 0.2 10*3/uL (ref 0.0–0.7)
Eosinophils Relative: 2 % (ref 0–5)
HCT: 44.4 % (ref 39.0–52.0)
Hemoglobin: 15.4 g/dL (ref 13.0–17.0)
Lymphocytes Relative: 24 % (ref 12–46)
Lymphs Abs: 2.4 10*3/uL (ref 0.7–4.0)
MCH: 31 pg (ref 26.0–34.0)
MCHC: 34.7 g/dL (ref 30.0–36.0)
MCV: 89.5 fL (ref 78.0–100.0)
Monocytes Absolute: 0.9 10*3/uL (ref 0.1–1.0)
Monocytes Relative: 10 % (ref 3–12)
Neutro Abs: 6.3 10*3/uL (ref 1.7–7.7)
Neutrophils Relative %: 64 % (ref 43–77)
Platelets: 240 10*3/uL (ref 150–400)
RBC: 4.96 MIL/uL (ref 4.22–5.81)
RDW: 13.2 % (ref 11.5–15.5)
WBC: 9.8 10*3/uL (ref 4.0–10.5)

## 2015-01-17 LAB — SURGICAL PCR SCREEN
MRSA, PCR: NEGATIVE
Staphylococcus aureus: NEGATIVE

## 2015-01-17 LAB — URINALYSIS, ROUTINE W REFLEX MICROSCOPIC
Bilirubin Urine: NEGATIVE
Glucose, UA: NEGATIVE mg/dL
Hgb urine dipstick: NEGATIVE
Ketones, ur: NEGATIVE mg/dL
Leukocytes, UA: NEGATIVE
Nitrite: NEGATIVE
Protein, ur: NEGATIVE mg/dL
Specific Gravity, Urine: 1.024 (ref 1.005–1.030)
Urobilinogen, UA: 0.2 mg/dL (ref 0.0–1.0)
pH: 5 (ref 5.0–8.0)

## 2015-01-17 LAB — APTT: aPTT: 27 seconds (ref 24–37)

## 2015-01-17 LAB — TYPE AND SCREEN
ABO/RH(D): O POS
Antibody Screen: NEGATIVE

## 2015-01-17 LAB — ABO/RH: ABO/RH(D): O POS

## 2015-01-17 MED ORDER — CHLORHEXIDINE GLUCONATE 4 % EX LIQD: 60.0000 mL | Freq: Once | CUTANEOUS | Status: DC

## 2015-01-17 NOTE — Pre-Procedure Instructions (Signed)
[place]  01/17/2015   Your procedure is scheduled on:  Wed, Mar 16 @ 1:00 PM  Report to [facility]  at 11:00 AM.  Call this number if you have problems the morning of surgery: [phone]   Remember:   Do not eat food or drink liquids after midnight.   Take these medicines the morning of surgery with A SIP OF WATER: Uroxatral(Alfuzosin)              No Goody's,BC's,Aleve,Aspirin,Ibuprofen,Fish Oil,or any Herbal Medications.    Do not wear jewelry.  Do not wear lotions, powders, or colognes. You may wear deodorant.  Men may shave face and neck.  Do not bring valuables to the hospital.  [facility] is not responsible                  for any belongings or valuables.               Contacts, dentures or bridgework may not be worn into surgery.  Leave suitcase in the car. After surgery it may be brought to your room.  For patients admitted to the hospital, discharge time is determined by your                treatment team.                Special Instructions:  [place] - Preparing for Surgery  Before surgery, you can play an important role.  Because skin is not sterile, your skin needs to be as free of germs as possible.  You can reduce the number of germs on you skin by washing with CHG (chlorahexidine gluconate) soap before surgery.  CHG is an antiseptic cleaner which kills germs and bonds with the skin to continue killing germs even after washing.  Please DO NOT use if you have an allergy to CHG or antibacterial soaps.  If your skin becomes reddened/irritated stop using the CHG and inform your nurse when you arrive at Short Stay.  Do not shave (including legs and underarms) for at least 48 hours prior to the first CHG shower.  You may shave your face.  Please follow these instructions carefully:   1.  Shower with CHG Soap the night before surgery and the                                morning of Surgery.  2.  If you choose to wash your hair, wash your hair first  as usual with your       normal shampoo.  3.  After you shampoo, rinse your hair and body thoroughly to remove the                      Shampoo.  4.  Use CHG as you would any other liquid soap.  You can apply chg directly       to the skin and wash gently with scrungie or a clean washcloth.  5.  Apply the CHG Soap to your body ONLY FROM THE NECK DOWN.        Do not use on open wounds or open sores.  Avoid contact with your eyes,       ears, mouth and genitals (private parts).  Wash genitals (private parts)       with your normal soap.  6.  Wash thoroughly, paying special attention to  the area where your surgery        will be performed.  7.  Thoroughly rinse your body with warm water from the neck down.  8.  DO NOT shower/wash with your normal soap after using and rinsing off       the CHG Soap.  9.  Pat yourself dry with a clean towel.            10.  Wear clean pajamas.            11.  Place clean sheets on your bed the night of your first shower and do not        sleep with pets.  Day of Surgery  Do not apply any lotions/deoderants the morning of surgery.  Please wear clean clothes to the hospital/surgery center.     Please read over the following fact sheets that you were given: Pain Booklet, Coughing and Deep Breathing, Blood Transfusion Information, MRSA Information and Surgical Site Infection Prevention

## 2015-01-17 NOTE — Progress Notes (Addendum)
CXR in epic from 08-27-14   Pt doesn't have a cardiologist   Medical MD is Dr.Robert Sheryn Bison with [facility]  Denies ever having a heart cath/stress test/echo  EKG done in Oct 2015-to request from Medical MD

## 2015-01-17 NOTE — Progress Notes (Signed)
   01/17/15 1030  OBSTRUCTIVE SLEEP APNEA  Have you ever been diagnosed with sleep apnea through a sleep study? No  Do you snore loudly (loud enough to be heard through closed doors)?  0  Do you often feel tired, fatigued, or sleepy during the daytime? 0  Has anyone observed you stop breathing during your sleep? 0  Do you have, or are you being treated for high blood pressure? 1  BMI more than 35 kg/m2? 1  Age over 66 years old? 1  Neck circumference greater than 40 cm/16 inches? 1  Gender: 1

## 2015-01-21 LAB — URINE CULTURE: Colony Count: 25000

## 2015-01-28 MED ORDER — TRANEXAMIC ACID 100 MG/ML IV SOLN
1000.0000 mg | INTRAVENOUS | Status: AC
  Administered 2015-01-29: 1000 mg via INTRAVENOUS
  Filled 2015-01-28: qty 10

## 2015-01-28 MED ORDER — DEXTROSE 5 % IV SOLN
3.0000 g | INTRAVENOUS | Status: DC
  Filled 2015-01-28: qty 3000

## 2015-01-28 MED ORDER — LACTATED RINGERS IV SOLN
INTRAVENOUS | Status: DC
  Administered 2015-01-29: 12:00:00 via INTRAVENOUS

## 2015-01-28 NOTE — Anesthesia Preprocedure Evaluation (Signed)
Anesthesia Evaluation  Patient identified by MRN, date of birth, ID band Patient awake    Reviewed: Allergy & Precautions, NPO status , Patient's Chart, lab work & pertinent test results  Airway        Dental   Pulmonary neg pulmonary ROS, Current Smoker,          Cardiovascular hypertension, Pt. on medications  EKG LBB   Neuro/Psych    GI/Hepatic GERD-  Medicated,  Endo/Other  diabetes, Type 2  Renal/GU      Musculoskeletal   Abdominal   Peds  Hematology 15/44   Anesthesia Other Findings   Reproductive/Obstetrics                             Anesthesia Physical Anesthesia Plan  ASA: III  Anesthesia Plan: General   Post-op Pain Management:    Induction: Intravenous  Airway Management Planned: Oral ETT  Additional Equipment:   Intra-op Plan:   Post-operative Plan: Extubation in OR  Informed Consent: I have reviewed the patients History and Physical, chart, labs and discussed the procedure including the risks, benefits and alternatives for the proposed anesthesia with the patient or authorized representative who has indicated his/her understanding and acceptance.     Plan Discussed with:   Anesthesia Plan Comments: (Multimodal pain RX, Glide available)        Anesthesia Quick Evaluation

## 2015-01-29 ENCOUNTER — Encounter (HOSPITAL_COMMUNITY)
Admission: RE | Disposition: A | Discharge status: Home Health | Payer: Self-pay | Source: Ambulatory Visit | Attending: Orthopedic Surgery

## 2015-01-29 ENCOUNTER — Inpatient Hospital Stay (HOSPITAL_COMMUNITY): Payer: 59 | Admitting: Anesthesiology

## 2015-01-29 ENCOUNTER — Encounter (HOSPITAL_COMMUNITY): Payer: Self-pay | Admitting: Certified Registered Nurse Anesthetist

## 2015-01-29 ENCOUNTER — Inpatient Hospital Stay (HOSPITAL_COMMUNITY)
Admission: RE | Admit: 2015-01-29 | Discharge: 2015-01-31 | DRG: 470 | Disposition: A | Discharge status: Home Health | Payer: 59 | Source: Ambulatory Visit | Attending: Orthopedic Surgery | Admitting: Orthopedic Surgery

## 2015-01-29 ENCOUNTER — Inpatient Hospital Stay (HOSPITAL_COMMUNITY): Payer: 59

## 2015-01-29 DIAGNOSIS — K219 Gastro-esophageal reflux disease without esophagitis: Secondary | ICD-10-CM | POA: Diagnosis present

## 2015-01-29 DIAGNOSIS — Z6841 Body Mass Index (BMI) 40.0 and over, adult: Secondary | ICD-10-CM | POA: Diagnosis not present

## 2015-01-29 DIAGNOSIS — E119 Type 2 diabetes mellitus without complications: Secondary | ICD-10-CM | POA: Diagnosis present

## 2015-01-29 DIAGNOSIS — Z87891 Personal history of nicotine dependence: Secondary | ICD-10-CM

## 2015-01-29 DIAGNOSIS — D62 Acute posthemorrhagic anemia: Secondary | ICD-10-CM | POA: Diagnosis not present

## 2015-01-29 DIAGNOSIS — Z7901 Long term (current) use of anticoagulants: Secondary | ICD-10-CM | POA: Diagnosis not present

## 2015-01-29 DIAGNOSIS — Z79899 Other long term (current) drug therapy: Secondary | ICD-10-CM

## 2015-01-29 DIAGNOSIS — R3915 Urgency of urination: Secondary | ICD-10-CM | POA: Diagnosis present

## 2015-01-29 DIAGNOSIS — I10 Essential (primary) hypertension: Secondary | ICD-10-CM | POA: Diagnosis present

## 2015-01-29 DIAGNOSIS — N401 Enlarged prostate with lower urinary tract symptoms: Secondary | ICD-10-CM | POA: Diagnosis present

## 2015-01-29 DIAGNOSIS — M1611 Unilateral primary osteoarthritis, right hip: Principal | ICD-10-CM | POA: Diagnosis present

## 2015-01-29 DIAGNOSIS — Z96641 Presence of right artificial hip joint: Secondary | ICD-10-CM

## 2015-01-29 DIAGNOSIS — M25551 Pain in right hip: Secondary | ICD-10-CM | POA: Diagnosis present

## 2015-01-29 DIAGNOSIS — M1610 Unilateral primary osteoarthritis, unspecified hip: Secondary | ICD-10-CM | POA: Diagnosis present

## 2015-01-29 HISTORY — PX: TOTAL HIP ARTHROPLASTY: SHX124

## 2015-01-29 LAB — GLUCOSE, CAPILLARY
Glucose-Capillary: 127 mg/dL — ABNORMAL HIGH (ref 70–99)
Glucose-Capillary: 149 mg/dL — ABNORMAL HIGH (ref 70–99)
Glucose-Capillary: 203 mg/dL — ABNORMAL HIGH (ref 70–99)

## 2015-01-29 LAB — CBC
HCT: 37.7 % — ABNORMAL LOW (ref 39.0–52.0)
Hemoglobin: 12.9 g/dL — ABNORMAL LOW (ref 13.0–17.0)
MCH: 30.4 pg (ref 26.0–34.0)
MCHC: 34.2 g/dL (ref 30.0–36.0)
MCV: 88.7 fL (ref 78.0–100.0)
Platelets: 211 10*3/uL (ref 150–400)
RBC: 4.25 MIL/uL (ref 4.22–5.81)
RDW: 13 % (ref 11.5–15.5)
WBC: 19.3 10*3/uL — ABNORMAL HIGH (ref 4.0–10.5)

## 2015-01-29 LAB — CREATININE, SERUM
Creatinine, Ser: 1.05 mg/dL (ref 0.50–1.35)
GFR calc Af Amer: 84 mL/min — ABNORMAL LOW (ref >90)
GFR calc non Af Amer: 73 mL/min — ABNORMAL LOW (ref >90)

## 2015-01-29 SURGERY — ARTHROPLASTY, HIP, TOTAL, ANTERIOR APPROACH
Anesthesia: General | Laterality: Right

## 2015-01-29 MED ORDER — ONDANSETRON HCL 4 MG/2ML IJ SOLN: 4.0000 mg | Freq: Four times a day (QID) | INTRAMUSCULAR | Status: DC | PRN

## 2015-01-29 MED ORDER — PROPOFOL 10 MG/ML IV BOLUS
INTRAVENOUS | Status: DC | PRN
  Administered 2015-01-29: 180 mg via INTRAVENOUS

## 2015-01-29 MED ORDER — GLYCOPYRROLATE 0.2 MG/ML IJ SOLN
INTRAMUSCULAR | Status: AC
  Filled 2015-01-29: qty 4

## 2015-01-29 MED ORDER — METOCLOPRAMIDE HCL 5 MG/ML IJ SOLN: 5.0000 mg | Freq: Three times a day (TID) | INTRAMUSCULAR | Status: DC | PRN

## 2015-01-29 MED ORDER — ENOXAPARIN SODIUM 40 MG/0.4ML ~~LOC~~ SOLN: 40.0000 mg | SUBCUTANEOUS | Status: DC

## 2015-01-29 MED ORDER — PHENOL 1.4 % MT LIQD: 1.0000 | OROMUCOSAL | Status: DC | PRN

## 2015-01-29 MED ORDER — ENOXAPARIN SODIUM 40 MG/0.4ML ~~LOC~~ SOLN
40.0000 mg | SUBCUTANEOUS | Status: DC
  Administered 2015-01-30 – 2015-01-31 (×2): 40 mg via SUBCUTANEOUS
  Filled 2015-01-29 (×2): qty 0.4

## 2015-01-29 MED ORDER — SENNOSIDES-DOCUSATE SODIUM 8.6-50 MG PO TABS
1.0000 | ORAL_TABLET | Freq: Every evening | ORAL | Status: DC | PRN
  Administered 2015-01-31: 1 via ORAL
  Filled 2015-01-29: qty 1

## 2015-01-29 MED ORDER — HYDROMORPHONE HCL 1 MG/ML IJ SOLN
INTRAMUSCULAR | Status: AC
  Filled 2015-01-29: qty 1

## 2015-01-29 MED ORDER — OXYCODONE-ACETAMINOPHEN 5-325 MG PO TABS: 1.0000 | ORAL_TABLET | ORAL | Status: DC | PRN

## 2015-01-29 MED ORDER — MIDAZOLAM HCL 5 MG/5ML IJ SOLN
INTRAMUSCULAR | Status: DC | PRN
  Administered 2015-01-29: 2 mg via INTRAVENOUS

## 2015-01-29 MED ORDER — NEOSTIGMINE METHYLSULFATE 10 MG/10ML IV SOLN
INTRAVENOUS | Status: DC | PRN
  Administered 2015-01-29: 5 mg via INTRAVENOUS

## 2015-01-29 MED ORDER — MENTHOL 3 MG MT LOZG: 1.0000 | LOZENGE | OROMUCOSAL | Status: DC | PRN

## 2015-01-29 MED ORDER — LOSARTAN POTASSIUM 50 MG PO TABS
100.0000 mg | ORAL_TABLET | Freq: Every day | ORAL | Status: DC
  Administered 2015-01-29 – 2015-01-31 (×3): 100 mg via ORAL
  Filled 2015-01-29 (×3): qty 2

## 2015-01-29 MED ORDER — INSULIN ASPART 100 UNIT/ML ~~LOC~~ SOLN: 0.0000 [IU] | Freq: Three times a day (TID) | SUBCUTANEOUS | Status: DC

## 2015-01-29 MED ORDER — METFORMIN HCL 500 MG PO TABS
500.0000 mg | ORAL_TABLET | Freq: Every day | ORAL | Status: DC
  Administered 2015-01-30 – 2015-01-31 (×2): 500 mg via ORAL
  Filled 2015-01-29 (×2): qty 1

## 2015-01-29 MED ORDER — CELECOXIB 200 MG PO CAPS
200.0000 mg | ORAL_CAPSULE | Freq: Two times a day (BID) | ORAL | Status: DC
  Administered 2015-01-29 – 2015-01-31 (×4): 200 mg via ORAL
  Filled 2015-01-29 (×4): qty 1

## 2015-01-29 MED ORDER — ROCURONIUM BROMIDE 50 MG/5ML IV SOLN
INTRAVENOUS | Status: AC
Start: 2015-01-29 — End: 2015-01-29
  Filled 2015-01-29: qty 1

## 2015-01-29 MED ORDER — FENTANYL CITRATE 0.05 MG/ML IJ SOLN
INTRAMUSCULAR | Status: DC | PRN
  Administered 2015-01-29: 50 ug via INTRAVENOUS
  Administered 2015-01-29 (×2): 100 ug via INTRAVENOUS

## 2015-01-29 MED ORDER — FENTANYL CITRATE 0.05 MG/ML IJ SOLN
25.0000 ug | INTRAMUSCULAR | Status: DC | PRN
  Administered 2015-01-29 (×3): 50 ug via INTRAVENOUS

## 2015-01-29 MED ORDER — ONDANSETRON HCL 4 MG PO TABS: 4.0000 mg | ORAL_TABLET | Freq: Three times a day (TID) | ORAL | Status: DC | PRN

## 2015-01-29 MED ORDER — DEXTROSE 5 % IV SOLN
3.0000 g | INTRAVENOUS | Status: DC | PRN
  Administered 2015-01-29: 3 g via INTRAVENOUS

## 2015-01-29 MED ORDER — FINASTERIDE 5 MG PO TABS
5.0000 mg | ORAL_TABLET | Freq: Every day | ORAL | Status: DC
  Administered 2015-01-30 – 2015-01-31 (×2): 5 mg via ORAL
  Filled 2015-01-29 (×2): qty 1

## 2015-01-29 MED ORDER — LIDOCAINE HCL (CARDIAC) 20 MG/ML IV SOLN
INTRAVENOUS | Status: DC | PRN
  Administered 2015-01-29: 50 mg via INTRAVENOUS

## 2015-01-29 MED ORDER — METHOCARBAMOL 1000 MG/10ML IJ SOLN
500.0000 mg | Freq: Four times a day (QID) | INTRAVENOUS | Status: DC | PRN
  Administered 2015-01-29: 500 mg via INTRAVENOUS
  Filled 2015-01-29 (×4): qty 5

## 2015-01-29 MED ORDER — BUPIVACAINE LIPOSOME 1.3 % IJ SUSP
INTRAMUSCULAR | Status: DC | PRN
  Administered 2015-01-29: 50 mL

## 2015-01-29 MED ORDER — ONDANSETRON HCL 4 MG/2ML IJ SOLN
INTRAMUSCULAR | Status: AC
  Filled 2015-01-29: qty 2

## 2015-01-29 MED ORDER — FENTANYL CITRATE 0.05 MG/ML IJ SOLN
INTRAMUSCULAR | Status: AC
  Filled 2015-01-29: qty 5

## 2015-01-29 MED ORDER — OXYCODONE HCL 5 MG PO TABS
5.0000 mg | ORAL_TABLET | ORAL | Status: DC | PRN
Start: 2015-01-29 — End: 2015-01-31
  Administered 2015-01-29 – 2015-01-31 (×7): 10 mg via ORAL
  Filled 2015-01-29 (×6): qty 2

## 2015-01-29 MED ORDER — FENTANYL CITRATE 0.05 MG/ML IJ SOLN
INTRAMUSCULAR | Status: AC
  Filled 2015-01-29: qty 2

## 2015-01-29 MED ORDER — CEFAZOLIN SODIUM-DEXTROSE 2-3 GM-% IV SOLR
2.0000 g | Freq: Four times a day (QID) | INTRAVENOUS | Status: AC
  Administered 2015-01-29 – 2015-01-30 (×2): 2 g via INTRAVENOUS
  Filled 2015-01-29 (×2): qty 50

## 2015-01-29 MED ORDER — ACETAMINOPHEN 10 MG/ML IV SOLN
1000.0000 mg | Freq: Once | INTRAVENOUS | Status: AC
  Administered 2015-01-29: 1000 mg via INTRAVENOUS

## 2015-01-29 MED ORDER — BISACODYL 5 MG PO TBEC
5.0000 mg | DELAYED_RELEASE_TABLET | Freq: Every day | ORAL | Status: DC | PRN
  Administered 2015-01-31: 5 mg via ORAL
  Filled 2015-01-29 (×4): qty 1

## 2015-01-29 MED ORDER — LACTATED RINGERS IV SOLN
INTRAVENOUS | Status: DC | PRN
  Administered 2015-01-29 (×3): via INTRAVENOUS

## 2015-01-29 MED ORDER — ACETAMINOPHEN 650 MG RE SUPP: 650.0000 mg | Freq: Four times a day (QID) | RECTAL | Status: DC | PRN

## 2015-01-29 MED ORDER — ROCURONIUM BROMIDE 100 MG/10ML IV SOLN
INTRAVENOUS | Status: DC | PRN
  Administered 2015-01-29 (×2): 10 mg via INTRAVENOUS
  Administered 2015-01-29: 50 mg via INTRAVENOUS

## 2015-01-29 MED ORDER — ONDANSETRON HCL 4 MG/2ML IJ SOLN
INTRAMUSCULAR | Status: DC | PRN
  Administered 2015-01-29: 4 mg via INTRAVENOUS

## 2015-01-29 MED ORDER — BISACODYL 5 MG PO TBEC: 5.0000 mg | DELAYED_RELEASE_TABLET | Freq: Every day | ORAL | Status: DC | PRN

## 2015-01-29 MED ORDER — ZOLPIDEM TARTRATE 5 MG PO TABS: 5.0000 mg | ORAL_TABLET | Freq: Every evening | ORAL | Status: DC | PRN

## 2015-01-29 MED ORDER — OXYCODONE HCL 5 MG PO TABS
ORAL_TABLET | ORAL | Status: AC
  Filled 2015-01-29: qty 2

## 2015-01-29 MED ORDER — DIPHENHYDRAMINE HCL 12.5 MG/5ML PO ELIX
12.5000 mg | ORAL_SOLUTION | ORAL | Status: DC | PRN
Start: 2015-01-29 — End: 2015-01-31

## 2015-01-29 MED ORDER — HYDROCHLOROTHIAZIDE 25 MG PO TABS
25.0000 mg | ORAL_TABLET | Freq: Every day | ORAL | Status: DC
  Administered 2015-01-30 – 2015-01-31 (×2): 25 mg via ORAL
  Filled 2015-01-29 (×2): qty 1

## 2015-01-29 MED ORDER — METHOCARBAMOL 500 MG PO TABS: 500.0000 mg | ORAL_TABLET | Freq: Four times a day (QID) | ORAL | Status: DC

## 2015-01-29 MED ORDER — ALUM & MAG HYDROXIDE-SIMETH 200-200-20 MG/5ML PO SUSP: 30.0000 mL | ORAL | Status: DC | PRN

## 2015-01-29 MED ORDER — VITAMIN D3 25 MCG (1000 UNIT) PO TABS
5000.0000 [IU] | ORAL_TABLET | Freq: Every day | ORAL | Status: DC
  Administered 2015-01-30 – 2015-01-31 (×2): 5000 [IU] via ORAL
  Filled 2015-01-29 (×4): qty 5

## 2015-01-29 MED ORDER — GLYCOPYRROLATE 0.2 MG/ML IJ SOLN
INTRAMUSCULAR | Status: DC | PRN
  Administered 2015-01-29: .8 mg via INTRAVENOUS

## 2015-01-29 MED ORDER — METOCLOPRAMIDE HCL 10 MG PO TABS: 5.0000 mg | ORAL_TABLET | Freq: Three times a day (TID) | ORAL | Status: DC | PRN

## 2015-01-29 MED ORDER — METHOCARBAMOL 500 MG PO TABS
500.0000 mg | ORAL_TABLET | Freq: Four times a day (QID) | ORAL | Status: DC | PRN
  Administered 2015-01-30 – 2015-01-31 (×3): 500 mg via ORAL
  Filled 2015-01-29 (×5): qty 1

## 2015-01-29 MED ORDER — PROMETHAZINE HCL 25 MG/ML IJ SOLN: 6.2500 mg | INTRAMUSCULAR | Status: DC | PRN

## 2015-01-29 MED ORDER — ACETAMINOPHEN 10 MG/ML IV SOLN
INTRAVENOUS | Status: AC
  Filled 2015-01-29: qty 100

## 2015-01-29 MED ORDER — 0.9 % SODIUM CHLORIDE (POUR BTL) OPTIME
TOPICAL | Status: DC | PRN
  Administered 2015-01-29: 1000 mL

## 2015-01-29 MED ORDER — ACETAMINOPHEN 325 MG PO TABS: 650.0000 mg | ORAL_TABLET | Freq: Four times a day (QID) | ORAL | Status: DC | PRN

## 2015-01-29 MED ORDER — MIDAZOLAM HCL 2 MG/2ML IJ SOLN
INTRAMUSCULAR | Status: AC
  Filled 2015-01-29: qty 2

## 2015-01-29 MED ORDER — HYDROMORPHONE HCL 1 MG/ML IJ SOLN
0.5000 mg | INTRAMUSCULAR | Status: DC | PRN
  Administered 2015-01-29 – 2015-01-30 (×6): 1 mg via INTRAVENOUS
  Filled 2015-01-29 (×5): qty 1

## 2015-01-29 MED ORDER — MEPERIDINE HCL 25 MG/ML IJ SOLN: 6.2500 mg | INTRAMUSCULAR | Status: DC | PRN

## 2015-01-29 MED ORDER — POTASSIUM CHLORIDE IN NACL 20-0.9 MEQ/L-% IV SOLN
INTRAVENOUS | Status: DC
  Administered 2015-01-30: 01:00:00 via INTRAVENOUS
  Filled 2015-01-29 (×2): qty 1000

## 2015-01-29 MED ORDER — DOCUSATE SODIUM 100 MG PO CAPS
100.0000 mg | ORAL_CAPSULE | Freq: Two times a day (BID) | ORAL | Status: DC
  Administered 2015-01-29 – 2015-01-31 (×4): 100 mg via ORAL
  Filled 2015-01-29 (×4): qty 1

## 2015-01-29 MED ORDER — LIDOCAINE HCL (CARDIAC) 20 MG/ML IV SOLN
INTRAVENOUS | Status: AC
  Filled 2015-01-29: qty 5

## 2015-01-29 MED ORDER — PROPOFOL 10 MG/ML IV BOLUS
INTRAVENOUS | Status: AC
  Filled 2015-01-29: qty 20

## 2015-01-29 MED ORDER — ONDANSETRON HCL 4 MG PO TABS: 4.0000 mg | ORAL_TABLET | Freq: Four times a day (QID) | ORAL | Status: DC | PRN

## 2015-01-29 MED ORDER — TAMSULOSIN HCL 0.4 MG PO CAPS
0.4000 mg | ORAL_CAPSULE | Freq: Every day | ORAL | Status: DC
  Administered 2015-01-30 – 2015-01-31 (×2): 0.4 mg via ORAL
  Filled 2015-01-29 (×2): qty 1

## 2015-01-29 SURGICAL SUPPLY — 57 items
BENZOIN TINCTURE PRP APPL 2/3 (GAUZE/BANDAGES/DRESSINGS) ×3 IMPLANT
BLADE SAW SGTL 18X1.27X75 (BLADE) ×2 IMPLANT
BLADE SAW SGTL 18X1.27X75MM (BLADE) ×1
BLADE SURG ROTATE 9660 (MISCELLANEOUS) IMPLANT
CAPT HIP TOTAL 2 ×3 IMPLANT
CLOSURE STERI-STRIP 1/2X4 (GAUZE/BANDAGES/DRESSINGS) ×1
CLSR STERI-STRIP ANTIMIC 1/2X4 (GAUZE/BANDAGES/DRESSINGS) ×2 IMPLANT
COVER SURGICAL LIGHT HANDLE (MISCELLANEOUS) ×6 IMPLANT
DRAPE IMP U-DRAPE 54X76 (DRAPES) ×9 IMPLANT
DRAPE INCISE IOBAN 66X45 STRL (DRAPES) ×3 IMPLANT
DRAPE ORTHO SPLIT 77X108 STRL (DRAPES) ×4
DRAPE PROXIMA HALF (DRAPES) ×3 IMPLANT
DRAPE SURG 17X23 STRL (DRAPES) ×3 IMPLANT
DRAPE SURG ORHT 6 SPLT 77X108 (DRAPES) ×2 IMPLANT
DRAPE U-SHAPE 47X51 STRL (DRAPES) ×3 IMPLANT
DRSG AQUACEL AG ADV 3.5X10 (GAUZE/BANDAGES/DRESSINGS) IMPLANT
DRSG AQUACEL AG ADV 3.5X14 (GAUZE/BANDAGES/DRESSINGS) ×3 IMPLANT
DURAPREP 26ML APPLICATOR (WOUND CARE) ×3 IMPLANT
ELECT BLADE 4.0 EZ CLEAN MEGAD (MISCELLANEOUS)
ELECT CAUTERY BLADE 6.4 (BLADE) ×3 IMPLANT
ELECT REM PT RETURN 9FT ADLT (ELECTROSURGICAL) ×3
ELECTRODE BLDE 4.0 EZ CLN MEGD (MISCELLANEOUS) IMPLANT
ELECTRODE REM PT RTRN 9FT ADLT (ELECTROSURGICAL) ×1 IMPLANT
FACESHIELD WRAPAROUND (MASK) ×9 IMPLANT
GLOVE BIOGEL PI IND STRL 6.5 (GLOVE) ×1 IMPLANT
GLOVE BIOGEL PI IND STRL 7.0 (GLOVE) ×1 IMPLANT
GLOVE BIOGEL PI INDICATOR 6.5 (GLOVE) ×2
GLOVE BIOGEL PI INDICATOR 7.0 (GLOVE) ×2
GLOVE ECLIPSE 6.5 STRL STRAW (GLOVE) ×6 IMPLANT
GLOVE ORTHO TXT STRL SZ7.5 (GLOVE) ×6 IMPLANT
GOWN STRL REUS W/ TWL LRG LVL3 (GOWN DISPOSABLE) ×2 IMPLANT
GOWN STRL REUS W/ TWL XL LVL3 (GOWN DISPOSABLE) ×2 IMPLANT
GOWN STRL REUS W/TWL LRG LVL3 (GOWN DISPOSABLE) ×4
GOWN STRL REUS W/TWL XL LVL3 (GOWN DISPOSABLE) ×4
KIT BASIN OR (CUSTOM PROCEDURE TRAY) ×3 IMPLANT
KIT ROOM TURNOVER OR (KITS) ×3 IMPLANT
MANIFOLD NEPTUNE II (INSTRUMENTS) ×3 IMPLANT
NDL SAFETY ECLIPSE 18X1.5 (NEEDLE) ×1 IMPLANT
NEEDLE HYPO 18GX1.5 SHARP (NEEDLE) ×2
NS IRRIG 1000ML POUR BTL (IV SOLUTION) ×3 IMPLANT
PACK TOTAL JOINT (CUSTOM PROCEDURE TRAY) ×3 IMPLANT
PACK UNIVERSAL I (CUSTOM PROCEDURE TRAY) ×3 IMPLANT
PAD ARMBOARD 7.5X6 YLW CONV (MISCELLANEOUS) ×6 IMPLANT
SUCTION FRAZIER TIP 10 FR DISP (SUCTIONS) ×3 IMPLANT
SUT MNCRL AB 4-0 PS2 18 (SUTURE) ×3 IMPLANT
SUT VIC AB 0 CT1 27 (SUTURE) ×2
SUT VIC AB 0 CT1 27XBRD ANBCTR (SUTURE) ×1 IMPLANT
SUT VIC AB 1 CT1 27 (SUTURE) ×4
SUT VIC AB 1 CT1 27XBRD ANBCTR (SUTURE) ×2 IMPLANT
SUT VIC AB 2-0 CT1 27 (SUTURE) ×4
SUT VIC AB 2-0 CT1 TAPERPNT 27 (SUTURE) ×2 IMPLANT
SYR 50ML LL SCALE MARK (SYRINGE) ×3 IMPLANT
TOWEL OR 17X24 6PK STRL BLUE (TOWEL DISPOSABLE) ×3 IMPLANT
TOWEL OR 17X26 10 PK STRL BLUE (TOWEL DISPOSABLE) ×3 IMPLANT
TRAY FOLEY CATH 14FR (SET/KITS/TRAYS/PACK) IMPLANT
WATER STERILE IRR 1000ML POUR (IV SOLUTION) ×3 IMPLANT
YANKAUER SUCT BULB TIP NO VENT (SUCTIONS) ×3 IMPLANT

## 2015-01-29 NOTE — Transfer of Care (Signed)
Immediate Anesthesia Transfer of Care Note  Patient: [name].  Procedure(s) Performed: Procedure(s): TOTAL HIP ARTHROPLASTY ANTERIOR APPROACH (Right)  Patient Location: PACU  Anesthesia Type:General  Level of Consciousness: awake, alert  and oriented  Airway & Oxygen Therapy: Patient Spontanous Breathing and Patient connected to nasal cannula oxygen  Post-op Assessment: Report given to RN  Post vital signs: Reviewed and stable  Last Vitals: There were no vitals filed for this visit.  Complications: No apparent anesthesia complications

## 2015-01-29 NOTE — Discharge Summary (Addendum)
Patient ID: [name]. MRN: [id number] DOB/AGE: Mar 23, 1951 64 y.o.  Admit date: 01/29/2015 Discharge date: 01/30/2015  Admission Diagnoses:  Active Problems:   Primary localized osteoarthrosis of pelvic region   Discharge Diagnoses:  Same  Past Medical History  Diagnosis Date  . BPH (benign prostatic hypertrophy)     takes Uroxatral daily as well as Finasteride   . Arthritis   . GERD (gastroesophageal reflux disease)     no meds  . Hypertension     takes Losartan and HCTZ daily  . Joint pain   . Back pain     occasionally  . Urinary frequency   . Urinary urgency   . Hepatitis hx of    at age 28  . Diabetes mellitus without complication     takes Metformin daily    Surgeries: Procedure(s): TOTAL HIP ARTHROPLASTY ANTERIOR APPROACH on 01/29/2015   Consultants:    Discharged Condition: Improved  Hospital Course: [name]. is an 64 y.o. male who was admitted 01/29/2015 for operative treatment of primary localized osteoarthritis right hip. Patient has severe unremitting pain that affects sleep, daily activities, and work/hobbies. After pre-op clearance the patient was taken to the operating room on 01/29/2015 and underwent  Procedure(s): TOTAL HIP ARTHROPLASTY ANTERIOR APPROACH.  Patient with a pre-op Hb of 15.4 developed ABLA on pod #1 with a Hb of 11.6.  He is currently asymptomatic but we will continue to follow.  Patient was given perioperative antibiotics:      Anti-infectives    Start     Dose/Rate Route Frequency Ordered Stop   01/29/15 2000  ceFAZolin (ANCEF) IVPB 2 g/50 mL premix     2 g 100 mL/hr over 30 Minutes Intravenous Every 6 hours 01/29/15 1806 01/30/15 0241   01/28/15 1148  ceFAZolin (ANCEF) 3 g in dextrose 5 % 50 mL IVPB  Status:  Discontinued     3 g 160 mL/hr over 30 Minutes Intravenous On call to O.R. 01/28/15 1148 01/29/15 1806       Patient was given sequential compression devices, early ambulation, and chemoprophylaxis to  prevent DVT.  Patient benefited maximally from hospital stay and there were no complications.    Recent vital signs:  Patient Vitals for the past 24 hrs:  BP Temp Pulse Resp SpO2  01/30/15 0453 (!) 107/57 mmHg 98.5 F (36.9 C) 80 15 95 %  01/30/15 0057 138/72 mmHg 98 F (36.7 C) 82 15 96 %  01/29/15 1950 (!) 144/76 mmHg 97.9 F (36.6 C) 87 15 95 %  01/29/15 1756 134/68 mmHg 98.7 F (37.1 C) - 15 100 %  01/29/15 1730 - 98.7 F (37.1 C) 71 14 100 %  01/29/15 1726 (!) 116/53 mmHg - 68 15 98 %  01/29/15 1715 - - 66 19 98 %  01/29/15 1711 113/61 mmHg - 65 14 99 %  01/29/15 1700 - - 66 12 99 %  01/29/15 1655 (!) 115/54 mmHg - 66 11 98 %  01/29/15 1645 - - 64 13 98 %  01/29/15 1641 (!) 108/56 mmHg - 65 10 98 %  01/29/15 1630 - - 67 13 100 %  01/29/15 1626 (!) 125/47 mmHg - 68 (!) 22 97 %  01/29/15 1615 - - 66 12 95 %  01/29/15 1611 122/68 mmHg - 66 12 98 %  01/29/15 1600 - - 67 11 98 %  01/29/15 1556 125/68 mmHg - 63 12 97 %  01/29/15 1545 - - 63 10 100 %  01/29/15 1541 (!) 128/57 mmHg - 63 16 99 %  01/29/15 1530 - - 73 18 100 %  01/29/15 1526 (!) 146/70 mmHg - 79 20 99 %  01/29/15 1525 - 98.3 F (36.8 C) - - -     Recent laboratory studies:   Recent Labs  01/29/15 02/22/07 01/30/15 0750  WBC 19.3* 9.9  HGB 12.9* 11.6*  HCT 37.7* 34.3*  PLT 211 204  NA  --  136  K  --  3.3*  CL  --  101  CO2  --  26  BUN  --  21  CREATININE 1.05 1.02  GLUCOSE  --  168*  CALCIUM  --  7.7*     Discharge Medications:     Medication List    STOP taking these medications        FISH OIL TRIPLE STRENGTH 1400 MG Caps     oxyCODONE-acetaminophen 10-325 MG per tablet  Commonly known as:  PERCOCET  Replaced by:  oxyCODONE-acetaminophen 5-325 MG per tablet      TAKE these medications        bisacodyl 5 MG EC tablet  Commonly known as:  DULCOLAX  Take 1 tablet (5 mg total) by mouth daily as needed for moderate constipation.     celecoxib 200 MG capsule  Commonly known as:   CELEBREX  Take 200 mg by mouth daily.     enoxaparin 40 MG/0.4ML injection  Commonly known as:  LOVENOX  Inject 0.4 mLs (40 mg total) into the skin daily.     finasteride 5 MG tablet  Commonly known as:  PROSCAR  Take 5 mg by mouth daily.     hydrochlorothiazide 25 MG tablet  Commonly known as:  HYDRODIURIL  Take 1 tablet by mouth  daily     losartan 100 MG tablet  Commonly known as:  COZAAR  Take 1 tablet by mouth  daily     metFORMIN 500 MG tablet  Commonly known as:  GLUCOPHAGE  Take 500 mg by mouth daily with breakfast.     methocarbamol 500 MG tablet  Commonly known as:  ROBAXIN  Take 1 tablet (500 mg total) by mouth 4 (four) times daily.     MILK THISTLE PO  Take 1 capsule by mouth daily.     ondansetron 4 MG tablet  Commonly known as:  ZOFRAN  Take 1 tablet (4 mg total) by mouth every 8 (eight) hours as needed for nausea or vomiting.     oxyCODONE-acetaminophen 5-325 MG per tablet  Commonly known as:  ROXICET  Take 1-2 tablets by mouth every 4 (four) hours as needed.     silodosin 8 MG Caps capsule  Commonly known as:  RAPAFLO  Take 8 mg by mouth daily with breakfast.     SUPER B COMPLEX PO  Take 1 tablet by mouth daily.     Vitamin D3 5000 UNITS Tabs  Take 1 tablet by mouth daily.     ZICAM ALLERGY RELIEF NA  Place 1-2 sprays into the nose 2 (two) times daily as needed.        Diagnostic Studies: Dg Hip Port Unilat With Pelvis 1v Right  01/29/2015   CLINICAL DATA:  65 year old male with a history of right total hip replacement.  EXAM: RIGHT HIP (WITH PELVIS) 1 VIEW PORTABLE  COMPARISON:  None.  FINDINGS: Bony pelvic ring appears intact, with no displaced fracture.  Surgical changes of right total hip arthroplasty. No complicating features identified.  Left tip projects normally over the acetabula. Osteoarthritis of the left hip. Visualized aspects of the left proximal fever unremarkable.  Degenerative changes of the lumbar spine.  Bridging osteophyte  production of the bilateral sacroiliac joints.  IMPRESSION: Surgical changes of right total hip arthroplasty without complicating features. No comparison available.  No acute bony abnormality is identified.  Bridging osteophyte formation of the bilateral sacroiliac joints.  Signed,  Dulcy Fanny. Earleen Newport, DO  [facility]  [facility]   Electronically Signed   By: Corrie Mckusick D.O.   On: 01/29/2015 16:59    Disposition: 01-Home or Self Care  Discharge Instructions    Call MD / Call 911    Complete by:  As directed   If you experience chest pain or shortness of breath, CALL 911 and be transported to the hospital emergency room.  If you develope a fever above 101 F, pus (white drainage) or increased drainage or redness at the wound, or calf pain, call your surgeon's office.     Change dressing    Complete by:  As directed   Change the dressing daily with sterile 4 x 4 inch gauze dressing and paper tape.  You may clean the incision with alcohol prior to redressing     Constipation Prevention    Complete by:  As directed   Drink plenty of fluids.  Prune juice may be helpful.  You may use a stool softener, such as Colace (over the counter) 100 mg twice a day.  Use MiraLax (over the counter) for constipation as needed.     Diet - low sodium heart healthy    Complete by:  As directed      Discharge instructions    Complete by:  As directed   Weight bearing as tolerated.  Use Lovenox injections as directed for a total of 7 days following surgery to prevent blood clots.  Change bandage daily starting on Saturday.  May shower on Monday, but do not soak incision.  May apply ice for up to 20 minutes at a time for pain and swelling.  Follow up appointment in two weeks.     Follow the hip precautions as taught in Physical Therapy    Complete by:  As directed   Posterior total hip precautions.  Weight bearing as tolerated     Increase activity slowly as tolerated     Complete by:  As directed      TED hose    Complete by:  As directed   Use stockings (TED hose) for 2-3 weeks on both leg(s).  You may remove them at night for sleeping.           Follow-up Information    Follow up with [facility], MD. Schedule an appointment as soon as possible for a visit in 2 weeks.   Specialty:  Orthopedic Surgery   Contact information:   [address] [place] [address] [phone]        Signed: Ermalene Postin. Mendel Ryder 01/30/2015, 10:11 AM

## 2015-01-29 NOTE — Anesthesia Postprocedure Evaluation (Signed)
  Anesthesia Post-op Note  Patient: [name].  Procedure(s) Performed: Procedure(s): TOTAL HIP ARTHROPLASTY ANTERIOR APPROACH (Right)  Patient Location: PACU  Anesthesia Type:General  Level of Consciousness: awake  Airway and Oxygen Therapy: Patient Spontanous Breathing and Patient connected to nasal cannula oxygen  Post-op Pain: mild  Post-op Assessment: Post-op Vital signs reviewed and Patient's Cardiovascular Status Stable  Post-op Vital Signs: Reviewed and stable  Last Vitals:  Filed Vitals:   01/29/15 1525  Temp: [id number] C    Complications: No apparent anesthesia complications

## 2015-01-29 NOTE — Plan of Care (Signed)
Problem: Consults Goal: Diagnosis- Total Joint Replacement Primary Total Hip RIght

## 2015-01-29 NOTE — Discharge Instructions (Signed)
Total Hip Replacement, Care After Refer to this sheet in the next few weeks. These instructions provide you with information on caring for yourself after your procedure. Your health care provider may also give you specific instructions. Your treatment has been planned according to the most current medical practices, but problems sometimes occur. Call your health care provider if you have any problems or questions after your procedure. HOME CARE INSTRUCTIONS   Weight bearing as tolerated.  Use Lovenox injections as directed for a total of 7 days following surgery to prevent blood clots.  Change bandage daily starting on Saturday.  May shower on Monday, but do not soak incision.  May apply ice for up to 20 minutes at a time for pain and swelling.  Follow up appointment in two weeks.  Your health care provider will give you specific precautions for certain types of movement. Additional instructions include:  Take medicines only as directed by your health care provider.  Take quick showers (3-5 min) rather than bathe until your health care provider tells you that you can take baths again.  Avoid lifting until your health care provider instructs you otherwise.  Use a raised toilet seat and avoid sitting in low chairs as instructed by your health care provider.  Use crutches or a walker as instructed by your health care provider. SEEK MEDICAL CARE IF:  You have difficulty breathing.  You have drainage, redness, or swelling at your incision site.  You have a bad smell coming from your incision site.  You have persistent bleeding from your incision site.  Your incision breaks open after sutures (stitches) or staples have been removed.  You have a fever. SEEK IMMEDIATE MEDICAL CARE IF:   You have a rash.  You have pain or swelling in your calf or thigh.  You have shortness of breath or chest pain. MAKE SURE YOU:  Understand these instructions.  Will watch your condition.  Will get  help if you are not doing well or get worse. Document Released: 05/21/2005 Document Revised: 03/18/2014 Document Reviewed: 01/02/2014 [facility] Patient Information 2015 [place], [place]. This information is not intended to replace advice given to you by your health care provider. Make sure you discuss any questions you have with your health care provider.

## 2015-01-29 NOTE — H&P (View-Only) (Signed)
TOTAL HIP ADMISSION H&P  Patient is admitted for right total hip arthroplasty.  Subjective:  Chief Complaint: right hip pain  HPI: [name], 64 y.o. male, has a history of pain and functional disability in the right hip(s) due to arthritis and patient has failed non-surgical conservative treatments for greater than 12 weeks to include NSAID's and/or analgesics, corticosteriod injections and activity modification.  Onset of symptoms was gradual starting 5 years ago with rapidlly worsening course since that time.The patient noted no past surgery on the right hip(s).  Patient currently rates pain in the right hip at 6 out of 10 with activity. Patient has night pain, worsening of pain with activity and weight bearing, trendelenberg gait, pain that interfers with activities of daily living and pain with passive range of motion. Patient has evidence of subchondral sclerosis and joint space narrowing by imaging studies. This condition presents safety issues increasing the risk of falls.  There is no current active infection.  There are no active problems to display for this patient.  Past Medical History  Diagnosis Date  . Shortness of breath     DOE  . Hypertension   . BPH (benign prostatic hypertrophy)   . Arthritis   . Complication of anesthesia     had SOB post op shoulder 2003  . GERD (gastroesophageal reflux disease)     no meds  . Snores     Past Surgical History  Procedure Laterality Date  . Appendectomy      age 61  . Knee arthroscopy  1984    right and left   . Vasectomy    . Vasectomy reversal  1993  . Colonoscopy    . Shoulder arthroscopy  2003    right RCR-GSC-stayed overnight-had SOB  . Shoulder arthroscopy with rotator cuff repair and subacromial decompression Left 03/13/2013    Procedure: LEFT SHOULDER ARTHROSCOPY WITH SUBACROMIAL DECOMPRESSION, DISTAL CLAVICLE RESECTION AND REPAIR ROTATOR CUFF AND LABRAL DEBRIDEMENT;  Surgeon: [name]., MD;  Location:  [facility];  Service: Orthopedics;  Laterality: Left;     (Not in a hospital admission) Allergies  Allergen Reactions  . Codeine Swelling    History  Substance Use Topics  . Smoking status: Former Smoker    Quit date: 03/06/1958  . Smokeless tobacco: Not on file  . Alcohol Use: Yes     Comment: occ    No family history on file.   Review of Systems  Constitutional: Negative.   HENT: Negative.   Eyes: Negative.   Respiratory: Negative.   Cardiovascular: Negative.   Gastrointestinal: Negative.   Genitourinary: Negative.   Musculoskeletal: Positive for joint pain.  Skin: Negative.   Neurological: Negative.   Endo/Heme/Allergies: Negative.   Psychiatric/Behavioral: Negative.     Objective:  Physical Exam  Constitutional: He is oriented to person, place, and time. He appears well-developed and well-nourished.  HENT:  Head: Normocephalic and atraumatic.  Eyes: EOM are normal. Pupils are equal, round, and reactive to light.  Neck: Normal range of motion. Neck supple.  Cardiovascular: Normal rate, regular rhythm and normal heart sounds.   Respiratory: Effort normal and breath sounds normal. No respiratory distress. He has no wheezes. He has no rales.  GI: Soft. Bowel sounds are normal. He exhibits no distension.  Musculoskeletal:  Antalgic Trendelenburg gait on the right. Positive logroll.  Negative straight leg raise.  Neurovascularly intact distally.    Neurological: He is alert and oriented to person, place, and time.  Skin: Skin is warm and dry.  Psychiatric: He has a normal mood and affect. His behavior is normal. Judgment and thought content normal.    Vital signs in last 24 hours: @VSRANGES @  Labs:   Estimated body mass index is 44.12 kg/(m^2) as calculated from the following:   Height as of 03/13/13: 5\' 10"  (1.778 m).   Weight as of 03/13/13: 139.481 kg (307 lb 8 oz).   Imaging Review Plain radiographs demonstrate severe degenerative joint  disease of the right hip(s). The bone quality appears to be fair for age and reported activity level.  Assessment/Plan:  End stage arthritis, right hip(s)  The patient history, physical examination, clinical judgement of the provider and imaging studies are consistent with end stage degenerative joint disease of the right hip(s) and total hip arthroplasty is deemed medically necessary. The treatment options including medical management, injection therapy, arthroscopy and arthroplasty were discussed at length. The risks and benefits of total hip arthroplasty were presented and reviewed. The risks due to aseptic loosening, infection, stiffness, dislocation/subluxation,  thromboembolic complications and other imponderables were discussed.  The patient acknowledged the explanation, agreed to proceed with the plan and consent was signed. Patient is being admitted for inpatient treatment for surgery, pain control, PT, OT, prophylactic antibiotics, VTE prophylaxis, progressive ambulation and ADL's and discharge planning.The patient is planning to be discharged home with home health services

## 2015-01-29 NOTE — Interval H&P Note (Signed)
History and Physical Interval Note:  01/29/2015 8:30 AM  [name].  has presented today for surgery, with the diagnosis of djd right hip  The various methods of treatment have been discussed with the patient and family. After consideration of risks, benefits and other options for treatment, the patient has consented to  Procedure(s): TOTAL HIP ARTHROPLASTY ANTERIOR APPROACH (Right) as a surgical intervention .  The patient's history has been reviewed, patient examined, no change in status, stable for surgery.  I have reviewed the patient's chart and labs.  Questions were answered to the patient's satisfaction.     [name] F

## 2015-01-30 ENCOUNTER — Encounter (HOSPITAL_COMMUNITY): Payer: Self-pay | Admitting: Orthopedic Surgery

## 2015-01-30 LAB — BASIC METABOLIC PANEL
Anion gap: 9 (ref 5–15)
BUN: 21 mg/dL (ref 6–23)
CO2: 26 mmol/L (ref 19–32)
Calcium: 7.7 mg/dL — ABNORMAL LOW (ref 8.4–10.5)
Chloride: 101 mmol/L (ref 96–112)
Creatinine, Ser: 1.02 mg/dL (ref 0.50–1.35)
GFR calc Af Amer: 87 mL/min — ABNORMAL LOW (ref >90)
GFR calc non Af Amer: 75 mL/min — ABNORMAL LOW (ref >90)
Glucose, Bld: 168 mg/dL — ABNORMAL HIGH (ref 70–99)
Potassium: 3.3 mmol/L — ABNORMAL LOW (ref 3.5–5.1)
Sodium: 136 mmol/L (ref 135–145)

## 2015-01-30 LAB — CBC
HCT: 34.3 % — ABNORMAL LOW (ref 39.0–52.0)
Hemoglobin: 11.6 g/dL — ABNORMAL LOW (ref 13.0–17.0)
MCH: 30.1 pg (ref 26.0–34.0)
MCHC: 33.8 g/dL (ref 30.0–36.0)
MCV: 88.9 fL (ref 78.0–100.0)
Platelets: 204 10*3/uL (ref 150–400)
RBC: 3.86 MIL/uL — ABNORMAL LOW (ref 4.22–5.81)
RDW: 13.3 % (ref 11.5–15.5)
WBC: 9.9 10*3/uL (ref 4.0–10.5)

## 2015-01-30 LAB — GLUCOSE, CAPILLARY
Glucose-Capillary: 212 mg/dL — ABNORMAL HIGH (ref 70–99)
Glucose-Capillary: 153 mg/dL — ABNORMAL HIGH (ref 70–99)
Glucose-Capillary: 138 mg/dL — ABNORMAL HIGH (ref 70–99)

## 2015-01-30 NOTE — Evaluation (Signed)
Occupational Therapy Evaluation Patient Details Name: [name]. MRN: [id number] DOB: Jul 10, 1951 Today's Date: 01/30/2015    History of Present Illness Pt is a 64 y/o M s/p R THA anterior approach on 01/29/15.  Pt's PMH includes SOB, HTN, B knee arthroscopy, and R shoulder arthroscopy w/ RC repair and subacromial decompression.   Clinical Impression   Patient independent>mod I PTA. Patient currently requires total assist for LB ADLs. Patient unable to perform sit<>stand or any functional transfer during OT eval secondary to increased pain. Below Mobility and Balance clinical information are per PTs evaluation. Patient unable to maintain static sitting balance without back support seated in recliner. Patient will benefit from acute OT to increase overall independence in the areas of ADLs, functional mobility, and overall safety in order to safely discharge to venue listed below.     Follow Up Recommendations  SNF;Supervision/Assistance - 24 hour    Equipment Recommendations  Other (comment) (Reacher, sock aid, LH sponge, LH shoe horn)    Recommendations for Other Services  None at this time    Precautions / Restrictions Precautions Precautions: Anterior Hip;Fall Precaution Booklet Issued: Yes (comment) Precaution Comments: Reviewed precautions, patient able to verbalize 4/4 precautions  Restrictions Weight Bearing Restrictions: Yes RLE Weight Bearing: Weight bearing as tolerated      Mobility - Per PT evaluation  Bed Mobility Overal bed mobility: Needs Assistance Bed Mobility: Supine to Sit     Supine to sit: Min assist;HOB elevated     General bed mobility comments: Pt requires assistance moving RLE OOB and verbal cues for proper hand placement and positioning.  Pt uses railings for assistance.  Transfers Overall transfer level: Needs assistance Equipment used: Rolling walker (2 wheeled) Transfers: Sit to/from Stand Sit to Stand: Min guard;From elevated surface          General transfer comment: Pt requires several attempts for sit>stand w/ elevated bed    Balance - Per PT evaluation Overall balance assessment: Needs assistance Sitting-balance support: Bilateral upper extremity supported;Feet supported Sitting balance-Leahy Scale: Poor     Standing balance support: Bilateral upper extremity supported Standing balance-Leahy Scale: Fair     ADL Overall ADL's : Needs assistance/impaired Eating/Feeding: Set up;Sitting   Grooming: Set up;Sitting Grooming Details (indicate cue type and reason): with back supported Upper Body Bathing: Minimal assitance;Sitting   Lower Body Bathing: Total assistance;Sitting/lateral leans   Upper Body Dressing : Minimal assistance;Sitting   Lower Body Dressing: Total assistance;Sitting/lateral leans   General ADL Comments: Patient unable to perform transfer (sit<>stand or functional transfer) during OT eval secondary to increased pain. During 2 attempts, patient leaned back into recliner and stated "I just can't do it right now". Patient told OT he ambulated 50-60 feet with PT earlier. Patient unable to reach BLEs for LB ADLs. Discussed use of AE - reacher, sock aid, LH sponge, LH shoe horn; patient hesitant to use these items at this time. Patient stated things were harder then he expected, but also hesitant when therapist mentioned SNF. Recommending SNF at this time. IF patient refuses, recommending HHOT.     Pertinent Vitals/Pain Pain Assessment: 0-10 Pain Score: 5  Pain Location: right hip Pain Descriptors / Indicators: Sore ("stinging") Pain Intervention(s): Patient requesting pain meds-RN notified;Monitored during session     Hand Dominance Right   Extremity/Trunk Assessment Upper Extremity Assessment Upper Extremity Assessment: Overall WFL for tasks assessed   Lower Extremity Assessment Lower Extremity Assessment: RLE deficits/detail RLE Deficits / Details: expected deficits s/p R THA  Cervical / Trunk Assessment Cervical / Trunk Assessment: Normal   Communication Communication Communication: No difficulties   Cognition Arousal/Alertness: Awake/alert Behavior During Therapy: WFL for tasks assessed/performed Overall Cognitive Status: Within Functional Limits for tasks assessed              Home Living Family/patient expects to be discharged to:: Private residence Living Arrangements: Spouse/significant other (wife blind and unable to assist) Available Help at Discharge: Family;Available PRN/intermittently (daughter available intermittently) Type of Home: House Home Access: Level entry Entrance Stairs-Number of Steps: 0 Entrance Stairs-Rails:  (n/a) Home Layout: Two level;Able to live on main level with bedroom/bathroom (Pt reports he plans to sleep in his recliner in his "man cave", 4 steps to main cave) Alternate Level Stairs-Number of Steps: 14 Alternate Level Stairs-Rails: None Bathroom Shower/Tub: Tub/shower unit;Curtain   Biochemist, clinical: Standard     Home Equipment: Cane - single point;Bedside commode;Walker - standard          Prior Functioning/Environment Level of Independence: Independent with assistive device(s)        Comments: using cane past 2 weeks in the community    OT Diagnosis: Generalized weakness;Acute pain   OT Problem List: Decreased strength;Decreased activity tolerance;Impaired balance (sitting and/or standing);Decreased safety awareness;Decreased knowledge of use of DME or AE;Decreased knowledge of precautions;Pain   OT Treatment/Interventions: Self-care/ADL training;Therapeutic exercise;Energy conservation;DME and/or AE instruction;Therapeutic activities;Patient/family education;Balance training    OT Goals(Current goals can be found in the care plan section) Acute Rehab OT Goals Patient Stated Goal: get stronger and go home OT Goal Formulation: With patient Time For Goal Achievement: 02/06/15 Potential to Achieve  Goals: Good ADL Goals Pt Will Perform Grooming: with min assist;standing Pt Will Perform Upper Body Bathing: with supervision;sitting Pt Will Perform Lower Body Bathing: with mod assist;sit to/from stand;with adaptive equipment Pt Will Perform Upper Body Dressing: with supervision;sitting Pt Will Perform Lower Body Dressing: with mod assist;sit to/from stand;with adaptive equipment Pt Will Transfer to Toilet: with min assist;ambulating;bedside commode Additional ADL Goal #1: patient will independently adhere to anterior hip precautions 100% of the time during ADLs and functional mobility  OT Frequency: Min 2X/week   Barriers to D/C: Decreased caregiver support  Patient's wife is blind and unable to assist. Daughter is available intermittently           End of Session Activity Tolerance: Patient limited by pain Patient left: in chair;with call bell/phone within reach   Time: [id number] OT Time Calculation (min): 20 min Charges:  OT General Charges $OT Visit: 1 Procedure OT Evaluation $Initial OT Evaluation Tier I: 1 Procedure  Gildo Crisco , MS, OTR/L, CLT Pager: [phone]  01/30/2015, 12:00 PM

## 2015-01-30 NOTE — Progress Notes (Signed)
Subjective: 1 Day Post-Op Procedure(s) (LRB): TOTAL HIP ARTHROPLASTY ANTERIOR APPROACH (Right) Patient reports pain as mild.  Acute urinary retention.   No nausea/vomiting, lightheadedness/dizziness.  Negative flatus/bm.  Tolerating diet.  Objective: Vital signs in last 24 hours: Temp:  [97.9 F (36.6 C)-98.7 F (37.1 C)] 98.5 F (36.9 C) (03/17 0453) Pulse Rate:  [63-87] 80 (03/17 0453) Resp:  [10-22] 15 (03/17 0453) BP: (107-146)/(47-76) 107/57 mmHg (03/17 0453) SpO2:  [95 %-100 %] 95 % (03/17 0453)  Intake/Output from previous day: 03/16 0701 - 03/17 0700 In: 2200 [I.V.:2200] Out: 1300 [Urine:850; Blood:450] Intake/Output this shift: Total I/O In: -  Out: 850 [Urine:850]   Recent Labs  01/29/15 2008  HGB 12.9*    Recent Labs  01/29/15 2008  WBC 19.3*  RBC 4.25  HCT 37.7*  PLT 211    Recent Labs  01/29/15 2008  CREATININE 1.05   No results for input(s): LABPT, INR in the last 72 hours.  Neurologically intact Neurovascular intact Sensation intact distally Intact pulses distally Dorsiflexion/Plantar flexion intact Compartment soft  Negative homans bilaterally  Assessment/Plan: 1 Day Post-Op Procedure(s) (LRB): TOTAL HIP ARTHROPLASTY ANTERIOR APPROACH (Right) Advance diet Up with therapy D/C IV fluids Discharge home with home health pending how well he does with PT WBAT RLE-anterior hip replacement precautions Dry dressing change prn   Venida Jarvis, M. Mendel Ryder 01/30/2015, 6:49 AM

## 2015-01-30 NOTE — Progress Notes (Signed)
Utilization review completed. Stephene Alegria, RN, BSN. 

## 2015-01-30 NOTE — Evaluation (Signed)
Physical Therapy Evaluation Patient Details Name: [name]. MRN: [id number] DOB: June 15, 1951 Today's Date: 01/30/2015   History of Present Illness  Pt is a 64 y/o M s/p R THA anterior approach on 01/29/15.  Pt's PMH includes SOB, HTN, B knee arthroscopy, and R shoulder arthroscopy w/ RC repair and subacromial decompression.  Clinical Impression  Pt is s/p R THA resulting in the deficits listed below (see PT Problem List). Pt will benefit from skilled PT to increase their independence and safety with mobility to allow discharge to the venue listed below. Stairs were not attempted this morning 2/2 pt's fatigue w/ ambulation.  PT is recommending SNF due to pt's home layout (has 4 stairs w/o rails) and lack of assistance at home.  Patient needs to practice stairs next session and crutches will be attempted.          Follow Up Recommendations SNF;Supervision/Assistance - 24 hour    Equipment Recommendations  Rolling walker with 5" wheels    Recommendations for Other Services       Precautions / Restrictions Precautions Precautions: Anterior Hip;Fall Precaution Booklet Issued: Yes (comment) Precaution Comments: Reviewed precautions w/ pt using teachback, pt able to remember 4/4 precautions Restrictions Weight Bearing Restrictions: Yes RLE Weight Bearing: Weight bearing as tolerated      Mobility  Bed Mobility Overal bed mobility: Needs Assistance Bed Mobility: Supine to Sit     Supine to sit: Min assist;HOB elevated     General bed mobility comments: Pt requires assistance moving RLE OOB and verbal cues for proper hand placement and positioning.  Pt uses railings for assistance.  Transfers Overall transfer level: Needs assistance Equipment used: Rolling walker (2 wheeled) Transfers: Sit to/from Stand Sit to Stand: Min guard;From elevated surface         General transfer comment: Pt requires several attempts for sit>stand w/ elevated  bed  Ambulation/Gait Ambulation/Gait assistance: Min guard Ambulation Distance (Feet): 70 Feet Assistive device: Rolling walker (2 wheeled) Gait Pattern/deviations: Step-to pattern;Decreased stance time - right;Decreased stride length;Antalgic;Trunk flexed   Gait velocity interpretation: Below normal speed for age/gender General Gait Details: verbal cues to stand upright but pt is resistant to this as it causes pain in his R knee likely 2/2 stretch.  Pt says he has been bending over while ambulating for a long time now.    Stairs Stairs:  (will attempt w/ crutches at next session)          Wheelchair Mobility    Modified Rankin (Stroke Patients Only)       Balance Overall balance assessment: Needs assistance Sitting-balance support: Bilateral upper extremity supported;Feet supported Sitting balance-Leahy Scale: Poor     Standing balance support: Bilateral upper extremity supported Standing balance-Leahy Scale: Fair                               Pertinent Vitals/Pain Pain Assessment: 0-10 Pain Score: 8  Pain Location: R hip and R knee Pain Descriptors / Indicators: Aching;Burning;Discomfort;Grimacing;Guarding Pain Intervention(s): Limited activity within patient's tolerance;Monitored during session;Repositioned    Home Living Family/patient expects to be discharged to:: Private residence Living Arrangements: Spouse/significant other (wife is blind and unable to assist) Available Help at Discharge: Family;Available PRN/intermittently (daughter available intermittently) Type of Home: House Home Access: Level entry Entrance Stairs-Rails:  (n/a) Entrance Stairs-Number of Steps: 0 Home Layout: Two level Home Equipment: Cane - single point;Bedside commode;Walker - standard      Prior  Function Level of Independence: Independent with assistive device(s)         Comments: using cane past 2 weeks in the community     Hand Dominance   Dominant Hand:  Right    Extremity/Trunk Assessment               Lower Extremity Assessment: RLE deficits/detail RLE Deficits / Details: expected deficits s/p R THA    Cervical / Trunk Assessment: Normal  Communication   Communication: No difficulties  Cognition Arousal/Alertness: Awake/alert Behavior During Therapy: WFL for tasks assessed/performed Overall Cognitive Status: Within Functional Limits for tasks assessed                      General Comments      Exercises Total Joint Exercises Ankle Circles/Pumps: AROM;Both;20 reps;Supine Quad Sets: AROM;Both;10 reps;Supine Heel Slides: AAROM;Right;10 reps;Supine      Assessment/Plan    PT Assessment Patient needs continued PT services  PT Diagnosis Difficulty walking;Abnormality of gait;Generalized weakness;Acute pain   PT Problem List Decreased strength;Decreased range of motion;Decreased activity tolerance;Decreased balance;Decreased mobility;Decreased coordination;Decreased knowledge of use of DME;Decreased safety awareness;Decreased knowledge of precautions;Pain  PT Treatment Interventions DME instruction;Gait training;Stair training;Functional mobility training;Therapeutic activities;Therapeutic exercise;Balance training;Neuromuscular re-education;Patient/family education;Modalities   PT Goals (Current goals can be found in the Care Plan section) Acute Rehab PT Goals Patient Stated Goal: To get out of bed PT Goal Formulation: With patient Time For Goal Achievement: 02/13/15 Potential to Achieve Goals: Good    Frequency 7X/week   Barriers to discharge Inaccessible home environment;Decreased caregiver support 14 steps and assistance only available intermittently    Co-evaluation               End of Session Equipment Utilized During Treatment: Gait belt Activity Tolerance: Patient limited by fatigue;Patient limited by pain Patient left: in chair;with call bell/phone within reach Nurse Communication:  Mobility status;Precautions;Weight bearing status         Time: [id number] PT Time Calculation (min) (ACUTE ONLY): 52 min   Charges:   PT Evaluation $Initial PT Evaluation Tier I: 1 Procedure PT Treatments $Gait Training: 8-22 mins $Therapeutic Exercise: 23-37 mins   PT G CodesJoslyn Hy PT, DPT [phone] #[phone] 01/30/2015, 11:06 AM

## 2015-01-30 NOTE — Progress Notes (Signed)
Patient has been unable to void on his own.  Tried warm compressions, repositioning, and running water.  At 0355, bladder scan showed 633 mL; did I/O cath removed 850 mL.  Patient was asymptomatic.

## 2015-01-30 NOTE — Op Note (Signed)
NAMEMASEO, LATTIN NO.:  [id number]  MEDICAL RECORD NO.:  [id number]  LOCATION:                                 FACILITY:  PHYSICIAN:  Ninetta Lights, M.D. DATE OF BIRTH:  1951-10-09  DATE OF PROCEDURE:  01/29/2015 DATE OF DISCHARGE:  01/29/2015                              OPERATIVE REPORT   PREOPERATIVE DIAGNOSES:  Right hip end-stage primary localized degenerative arthritis.  Marked bony destruction, a centimeter of shortening.  Morbid obesity.  BMI greater than 41.  POSTOPERATIVE DIAGNOSES:  Right hip end-stage primary localized degenerative arthritis.  Marked bony destruction, a centimeter of shortening.  Morbid obesity.  BMI greater than 41.  PROCEDURE:  Direct anterior right total hip replacement utilizing Stryker prosthesis.  A press fit 60 mm acetabular component screw fixation x2, 36-mm internal diameter liner.  A press-fit #5 Accolade stem, 36+ 0 Biolox head.  SURGEON:  Ninetta Lights, M.D.  ASSISTANT:  Elmyra Ricks, PA, present throughout the entire case and necessary for timely completion of procedure.  ANESTHESIA:  General.  BLOOD LOSS:  500 mL.  BLOOD GIVEN:  None.  SPECIMENS:  None.  CULTURES:  None.  COMPLICATIONS:  None.  DRESSINGS:  Soft compressive.  PROCEDURE IN DETAIL:  The patient was brought to the operating room, and after adequate level of anesthesia had been obtained, appropriately positioned, prepped and draped for an anterior hip replacement. Everything was more difficult because of his obesity.  Longitudinal incision just posterior to the anterior-superior iliac spine was extending distally.  Skin and subcutaneous tissue were divided. Hemostasis with cautery.  Tensor exposed, elevated anteriorly.  The fatty tissue and anterior capsule were exposed.  Neurovascular structures were protected.  Capsule excised.  Femoral neck cut one fingerbreadth above the lesser trochanter.  Acetabulum exposed.   Marked destructive change throughout.  Almost egg shaped acetabulum because of deformity, brought up to good bleeding bone, sizing and fitting for a 60 mm component, hammered in at 40 degrees of abduction, slight anteversion.  Good capturing and fixation augmented with 2 screws through the cup.  A 36-mm internal diameter liner.  Proximal femur was freed up, exposed.  Broaches and rasps were used to bring this up to good sizing and fitting with a #5 stem.  After appropriate trials, I used a 36+ 0 head.  With this, equal leg lengths, stable reduction. Wound irrigated.  Injected with Exparel.  Fascia closed with Vicryl, subcutaneous and subcuticular closure.  Margins were injected with Marcaine.  Sterile compressive dressing applied.  Anesthesia reversed.  Brought to the recovery room. Tolerated the surgery well.  No complications.     Ninetta Lights, M.D.     DFM/MEDQ  D:  01/30/2015  T:  01/30/2015  Job:  [id number]

## 2015-01-30 NOTE — Progress Notes (Signed)
Physical Therapy Treatment Patient Details Name: [name]. MRN: [id number] DOB: Jun 13, 1951 Today's Date: 01/30/2015    History of Present Illness Pt is a 65 y/o M s/p R THA anterior approach on 01/29/15.  Pt's PMH includes SOB, HTN, B knee arthroscopy, and R shoulder arthroscopy w/ RC repair and subacromial decompression.    PT Comments    Did not attempt stairs w/ crutches this session 2/2 pt's impaired balance.  Pt feels very strongly about going home rather than SNF as his wife is blind and depends on him to do things around the home.  Pt and daughter say they may be able to move things around at the home prior to pt's d/c that would allow him to live on the main floor w/ only a few steps to enter w/ a railing.  PT will confirm this arrangement at next session and will assess pt's ability to ascend/descend stairs as appropriate for potential d/c home.  However, PT explained to pt that the OT is recommending SNF at this time (see OT note).    Follow Up Recommendations  Home health PT;Supervision - Intermittent     Equipment Recommendations  Rolling walker with 5" wheels    Recommendations for Other Services       Precautions / Restrictions Precautions Precautions: Anterior Hip;Fall Precaution Comments: Reviewed precautions, patient able to verbalize 4/4 precautions  Restrictions Weight Bearing Restrictions: Yes RLE Weight Bearing: Weight bearing as tolerated    Mobility  Bed Mobility                  Transfers Overall transfer level: Needs assistance Equipment used: Rolling walker (2 wheeled) Transfers: Sit to/from Stand Sit to Stand: Supervision         General transfer comment: Pt has difficulty controlling descent during stand>sit and verbal cues were provided to scoot RLE away from body slightly and to slowly lower down to the chair  Ambulation/Gait Ambulation/Gait assistance: Supervision Ambulation Distance (Feet): 200 Feet Assistive device:  Rolling walker (2 wheeled) Gait Pattern/deviations: Step-through pattern;Decreased stride length;Decreased stance time - right;Antalgic;Trunk flexed   Gait velocity interpretation: Below normal speed for age/gender General Gait Details: verbal cues to stand upright which pt was able to achieve.  Cues to take turns slowly and in small steps so as not to break precuations   Stairs Stairs:  (did not attempt this visit 2/2 pt's impaired standing balanc)          Wheelchair Mobility    Modified Rankin (Stroke Patients Only)       Balance   Sitting-balance support: Feet supported;No upper extremity supported Sitting balance-Leahy Scale: Poor     Standing balance support: Bilateral upper extremity supported Standing balance-Leahy Scale: Fair (Pt w/ slight loss of balance w/o BUE support)                      Cognition Arousal/Alertness: Awake/alert Behavior During Therapy: WFL for tasks assessed/performed Overall Cognitive Status: Within Functional Limits for tasks assessed                      Exercises Total Joint Exercises Quad Sets: AROM;Both;Seated;10 reps Straight Leg Raises: AAROM;Right;5 reps;Seated    General Comments        Pertinent Vitals/Pain Pain Assessment: 0-10 Pain Score: 8  Pain Location: R hip Pain Descriptors / Indicators: Aching;Sore;Nagging Pain Intervention(s): Limited activity within patient's tolerance;Monitored during session;Repositioned;Premedicated before session    Home Living Family/patient expects  to be discharged to:: Private residence Living Arrangements: Spouse/significant other (wife blind and unable to assist) Available Help at Discharge: Family;Available PRN/intermittently (daughter available intermittently) Type of Home: House Home Access: Level entry   Home Layout: Two level;Able to live on main level with bedroom/bathroom (Pt reports he plans to sleep in his recliner in his "man cave", 4 steps to main  cave) Home Equipment: Kasandra Knudsen - single point;Bedside commode;Walker - standard      Prior Function Level of Independence: Independent with assistive device(s)      Comments: using cane past 2 weeks in the community   PT Goals (current goals can now be found in the care plan section) Acute Rehab PT Goals Patient Stated Goal: get stronger and go home PT Goal Formulation: With patient/family Time For Goal Achievement: 02/13/15 Potential to Achieve Goals: Good Progress towards PT goals: Progressing toward goals    Frequency  7X/week    PT Plan Discharge plan needs to be updated    Co-evaluation             End of Session Equipment Utilized During Treatment: Gait belt Activity Tolerance: Patient limited by fatigue Patient left: in chair;with call bell/phone within reach;with family/visitor present     Time: [id number] PT Time Calculation (min) (ACUTE ONLY): 44 min  Charges:  $Gait Training: 23-37 mins $Therapeutic Exercise: 8-22 mins                    G CodesJoslyn Hy PT, [place] [id number] [phone] 01/30/2015, 2:34 PM

## 2015-01-31 LAB — BASIC METABOLIC PANEL
Anion gap: 8 (ref 5–15)
BUN: 31 mg/dL — ABNORMAL HIGH (ref 6–23)
CO2: 28 mmol/L (ref 19–32)
Calcium: 7.6 mg/dL — ABNORMAL LOW (ref 8.4–10.5)
Chloride: 97 mmol/L (ref 96–112)
Creatinine, Ser: 1.29 mg/dL (ref 0.50–1.35)
GFR calc Af Amer: 66 mL/min — ABNORMAL LOW (ref >90)
GFR calc non Af Amer: 57 mL/min — ABNORMAL LOW (ref >90)
Glucose, Bld: 165 mg/dL — ABNORMAL HIGH (ref 70–99)
Potassium: 3.4 mmol/L — ABNORMAL LOW (ref 3.5–5.1)
Sodium: 133 mmol/L — ABNORMAL LOW (ref 135–145)

## 2015-01-31 LAB — GLUCOSE, CAPILLARY: Glucose-Capillary: 143 mg/dL — ABNORMAL HIGH (ref 70–99)

## 2015-01-31 LAB — CBC
HCT: 31.1 % — ABNORMAL LOW (ref 39.0–52.0)
Hemoglobin: 10.5 g/dL — ABNORMAL LOW (ref 13.0–17.0)
MCH: 30.3 pg (ref 26.0–34.0)
MCHC: 33.8 g/dL (ref 30.0–36.0)
MCV: 89.9 fL (ref 78.0–100.0)
Platelets: 161 10*3/uL (ref 150–400)
RBC: 3.46 MIL/uL — ABNORMAL LOW (ref 4.22–5.81)
RDW: 13.4 % (ref 11.5–15.5)
WBC: 10.6 10*3/uL — ABNORMAL HIGH (ref 4.0–10.5)

## 2015-01-31 NOTE — Progress Notes (Signed)
Physical Therapy Treatment Patient Details Name: [name]. MRN: [id number] DOB: September 18, 1951 Today's Date: 01/31/2015    History of Present Illness Pt is a 64 y/o M s/p R THA anterior approach on 01/29/15.  Pt's PMH includes SOB, HTN, B knee arthroscopy, and R shoulder arthroscopy w/ RC repair and subacromial decompression.    PT Comments    Pt was able to ascend/descend stairs w/ min guard assist and is anticipating d/c to home w/ HHPT either today or tomorrow.  Pt expresses desire to go home tomorrow as his pt is hesitant about pt going home today.  PT will continue to follow acutely.   Follow Up Recommendations  Home health PT;Supervision - Intermittent     Equipment Recommendations  Rolling walker with 5" wheels    Recommendations for Other Services       Precautions / Restrictions Precautions Precautions: Anterior Hip;Fall Precaution Comments: Reviewed precautions, patient able to verbalize 4/4 precautions  Restrictions Weight Bearing Restrictions: Yes RLE Weight Bearing: Weight bearing as tolerated    Mobility  Bed Mobility                  Transfers Overall transfer level: Needs assistance Equipment used: Rolling walker (2 wheeled) Transfers: Sit to/from Stand Sit to Stand: Supervision         General transfer comment: Pt has difficulty controlling descent during stand>sit and verbal cues were provided to scoot RLE away from body slightly and to slowly lower down to the chair  Ambulation/Gait Ambulation/Gait assistance: Modified independent (Device/Increase time) Ambulation Distance (Feet): 200 Feet Assistive device: Rolling walker (2 wheeled) Gait Pattern/deviations: Step-through pattern;Antalgic;Trunk flexed;Decreased stance time - right;Decreased stride length   Gait velocity interpretation: Below normal speed for age/gender General Gait Details: verbal cues to stand upright   Stairs Stairs: Yes Stairs assistance: Min guard Stair  Management: Two rails;Step to pattern;Forwards Number of Stairs: 2 General stair comments: instruction on proper technique w/ LLE up first and RLE down first  Wheelchair Mobility    Modified Rankin (Stroke Patients Only)       Balance Overall balance assessment: Needs assistance Sitting-balance support: No upper extremity supported;Feet supported Sitting balance-Leahy Scale: Fair     Standing balance support: Bilateral upper extremity supported Standing balance-Leahy Scale: Fair                      Cognition Arousal/Alertness: Awake/alert Behavior During Therapy: WFL for tasks assessed/performed Overall Cognitive Status: Within Functional Limits for tasks assessed                      Exercises Total Joint Exercises Straight Leg Raises: AAROM;Right;5 reps;Seated Other Exercises Other Exercises: Standing R knee flexion x 10 AROM Other Exercises: Standing R hip F x10 AROM    General Comments        Pertinent Vitals/Pain Pain Assessment: 0-10 Pain Score: 8  Pain Location: R hip Pain Descriptors / Indicators: Aching;Sore Pain Intervention(s): Limited activity within patient's tolerance;Monitored during session;Repositioned    Home Living                      Prior Function            PT Goals (current goals can now be found in the care plan section) Acute Rehab PT Goals Patient Stated Goal: get stronger and go home Progress towards PT goals: Progressing toward goals    Frequency  7X/week    PT  Plan      Co-evaluation             End of Session Equipment Utilized During Treatment: Gait belt Activity Tolerance: Patient tolerated treatment well Patient left: in chair;with call bell/phone within reach     Time: [id number] PT Time Calculation (min) (ACUTE ONLY): 34 min  Charges:  $Gait Training: 23-37 mins                    G CodesJoslyn Hy PT, [place] [id number] [phone] 01/31/2015, 3:19 PM

## 2015-01-31 NOTE — Progress Notes (Signed)
Patient refusing blood sugar checks. States that he is not diabetic and that his A1c about 6.

## 2015-01-31 NOTE — Progress Notes (Signed)
Patient states he has a walker at home and does not need another one.

## 2015-01-31 NOTE — Progress Notes (Signed)
Pt unable to urinate on own. Had intermittent cath x2. Foley inserted 3-18 @2 :40am per protocol.

## 2015-01-31 NOTE — Progress Notes (Signed)
Occupational Therapy Treatment Patient Details Name: [name]. MRN: [id number] DOB: 03/27/1951 Today's Date: 01/31/2015    History of present illness Pt is a 64 y/o M s/p R THA anterior approach on 01/29/15.  Pt's PMH includes SOB, HTN, B knee arthroscopy, and R shoulder arthroscopy w/ RC repair and subacromial decompression.   OT comments  Pt doing better with mobility. Educated pt on completing ADL as he has to be mod I with this as his wife is unable to help him. Recommend pt have [facility] after D/C to facilitate return to independence.  Follow Up Recommendations  Home health OT;Supervision - Intermittent    Equipment Recommendations  3 in 1 bedside comode    Recommendations for Other Services      Precautions / Restrictions Precautions Precautions: Anterior Hip;Fall Precaution Comments: reviewed precautions Restrictions Weight Bearing Restrictions: Yes RLE Weight Bearing: Weight bearing as tolerated       Mobility Bed Mobility                  Transfers Overall transfer level: Needs assistance Equipment used: Rolling walker (2 wheeled) Transfers: Sit to/from Stand Sit to Stand: Supervision         General transfer comment: overall S    Balance Overall balance assessment: Needs assistance Sitting-balance support: No upper extremity supported;Feet supported Sitting balance-Leahy Scale: Fair     Standing balance support: Bilateral upper extremity supported Standing balance-Leahy Scale: Fair                     ADL                                         General ADL Comments: educated pt on availability of AE for LB ADL. Pt verbalized understanding. Educated pt on use of 3 in 1 in tub and backing to 3 in 1 to sit instead of stepping over tub until hip is strong enough to sustain weight. Pt verbalized understanding. also discussed home safety and set up to facilitate independence and reduce risk of falls.                                       Cognition   Behavior During Therapy: WFL for tasks assessed/performed Overall Cognitive Status: Within Functional Limits for tasks assessed                               General Comments      Pertinent Vitals/ Pain       Pain Assessment: 0-10 Pain Score: 6  Pain Location: R hip Pain Descriptors / Indicators: Aching Pain Intervention(s): Limited activity within patient's tolerance;Monitored during session;Repositioned;Ice applied  Home Living                                          Prior Functioning/Environment              Frequency Min 2X/week     Progress Toward Goals  OT Goals(current goals can now be found in the care plan section)  Progress towards OT goals: Progressing toward goals  Acute Rehab OT Goals Patient Stated  Goal: get stronger and go home OT Goal Formulation: With patient Time For Goal Achievement: 02/06/15 Potential to Achieve Goals: Good ADL Goals Pt Will Perform Grooming: with min assist;standing Pt Will Perform Upper Body Bathing: with supervision;sitting Pt Will Perform Lower Body Bathing: with mod assist;sit to/from stand;with adaptive equipment Pt Will Perform Upper Body Dressing: with supervision;sitting Pt Will Perform Lower Body Dressing: with mod assist;sit to/from stand;with adaptive equipment Pt Will Transfer to Toilet: with min assist;ambulating;bedside commode Additional ADL Goal #1: patient will independently adhere to anterior hip precautions 100% of the time during ADLs and functional mobility  Plan Discharge plan needs to be updated    Co-evaluation                 End of Session     Activity Tolerance Patient tolerated treatment well   Patient Left in chair;with call bell/phone within reach   Nurse Communication Mobility status        Time: 1510-1539 OT Time Calculation (min): 29 min  Charges: OT General Charges $OT Visit: 1 Procedure OT  Treatments $Self Care/Home Management : 23-37 mins  [name],[name] 01/31/2015, 3:53 PM   [facility], OTR/L  [phone] 01/31/2015

## 2015-01-31 NOTE — Progress Notes (Signed)
Subjective: 2 Days Post-Op Procedure(s) (LRB): TOTAL HIP ARTHROPLASTY ANTERIOR APPROACH (Right) Patient reports pain as mild.  No nausea/vomiting, lightheadedness/dizziness.  No flatus/bm. Tolerating diet.   Objective: Vital signs in last 24 hours: Temp:  [98.2 F (36.8 C)-98.7 F (37.1 C)] 98.5 F (36.9 C) (03/18 0602) Pulse Rate:  [82-90] 88 (03/18 0602) Resp:  [18] 18 (03/18 0602) BP: (102-136)/(49-62) 136/62 mmHg (03/18 0602) SpO2:  [93 %-95 %] 95 % (03/18 0602)  Intake/Output from previous day: 03/17 0701 - 03/18 0700 In: 600 [P.O.:600] Out: 900 [Urine:900] Intake/Output this shift:     Recent Labs  01/29/15 2008 01/30/15 0750 01/31/15 0504  HGB 12.9* 11.6* 10.5*    Recent Labs  01/30/15 0750 01/31/15 0504  WBC 9.9 10.6*  RBC 3.86* 3.46*  HCT 34.3* 31.1*  PLT 204 161    Recent Labs  01/30/15 0750 01/31/15 0504  NA 136 133*  K 3.3* 3.4*  CL 101 97  CO2 26 28  BUN 21 31*  CREATININE 1.02 1.29  GLUCOSE 168* 165*  CALCIUM 7.7* 7.6*   No results for input(s): LABPT, INR in the last 72 hours.  Neurologically intact Neurovascular intact Sensation intact distally Intact pulses distally Dorsiflexion/Plantar flexion intact Compartment soft  Assessment/Plan: 2 Days Post-Op Procedure(s) (LRB): TOTAL HIP ARTHROPLASTY ANTERIOR APPROACH (Right) Advance diet Up with therapy Discharge home with home health today depending on how well he does with steps WBAT RLE-anterior total hip replacement precautions ABLA-mild and stable Acute urinary retention- continue foley and rapaflo.  Will speak with pt's urologist and check back in with [place] (nurse for this patient).   [name] 01/31/2015, 7:39 AM

## 2016-01-15 ENCOUNTER — Other Ambulatory Visit: Payer: Self-pay | Admitting: Family Medicine

## 2016-01-16 ENCOUNTER — Other Ambulatory Visit: Payer: Self-pay | Admitting: Family Medicine

## 2016-01-16 DIAGNOSIS — R945 Abnormal results of liver function studies: Secondary | ICD-10-CM

## 2016-01-30 DIAGNOSIS — N138 Other obstructive and reflux uropathy: Secondary | ICD-10-CM | POA: Insufficient documentation

## 2016-01-30 DIAGNOSIS — N401 Enlarged prostate with lower urinary tract symptoms: Secondary | ICD-10-CM

## 2016-02-02 ENCOUNTER — Ambulatory Visit
Admission: RE | Admit: 2016-02-02 | Discharge: 2016-02-02 | Disposition: A | Discharge status: Home / Self Care | Payer: Medicare Other | Source: Ambulatory Visit | Attending: Family Medicine | Admitting: Family Medicine

## 2016-02-02 DIAGNOSIS — R945 Abnormal results of liver function studies: Secondary | ICD-10-CM

## 2016-03-14 IMAGING — CR DG HIP (WITH OR WITHOUT PELVIS) 1V PORT*R*
2 series · 2 of 2 positions shown · non-contrast
Comparison: None.

CLINICAL DATA: 63-year-old male with a history of right total hip
replacement.

EXAM:
RIGHT HIP (WITH PELVIS) 1 VIEW PORTABLE

[AP]
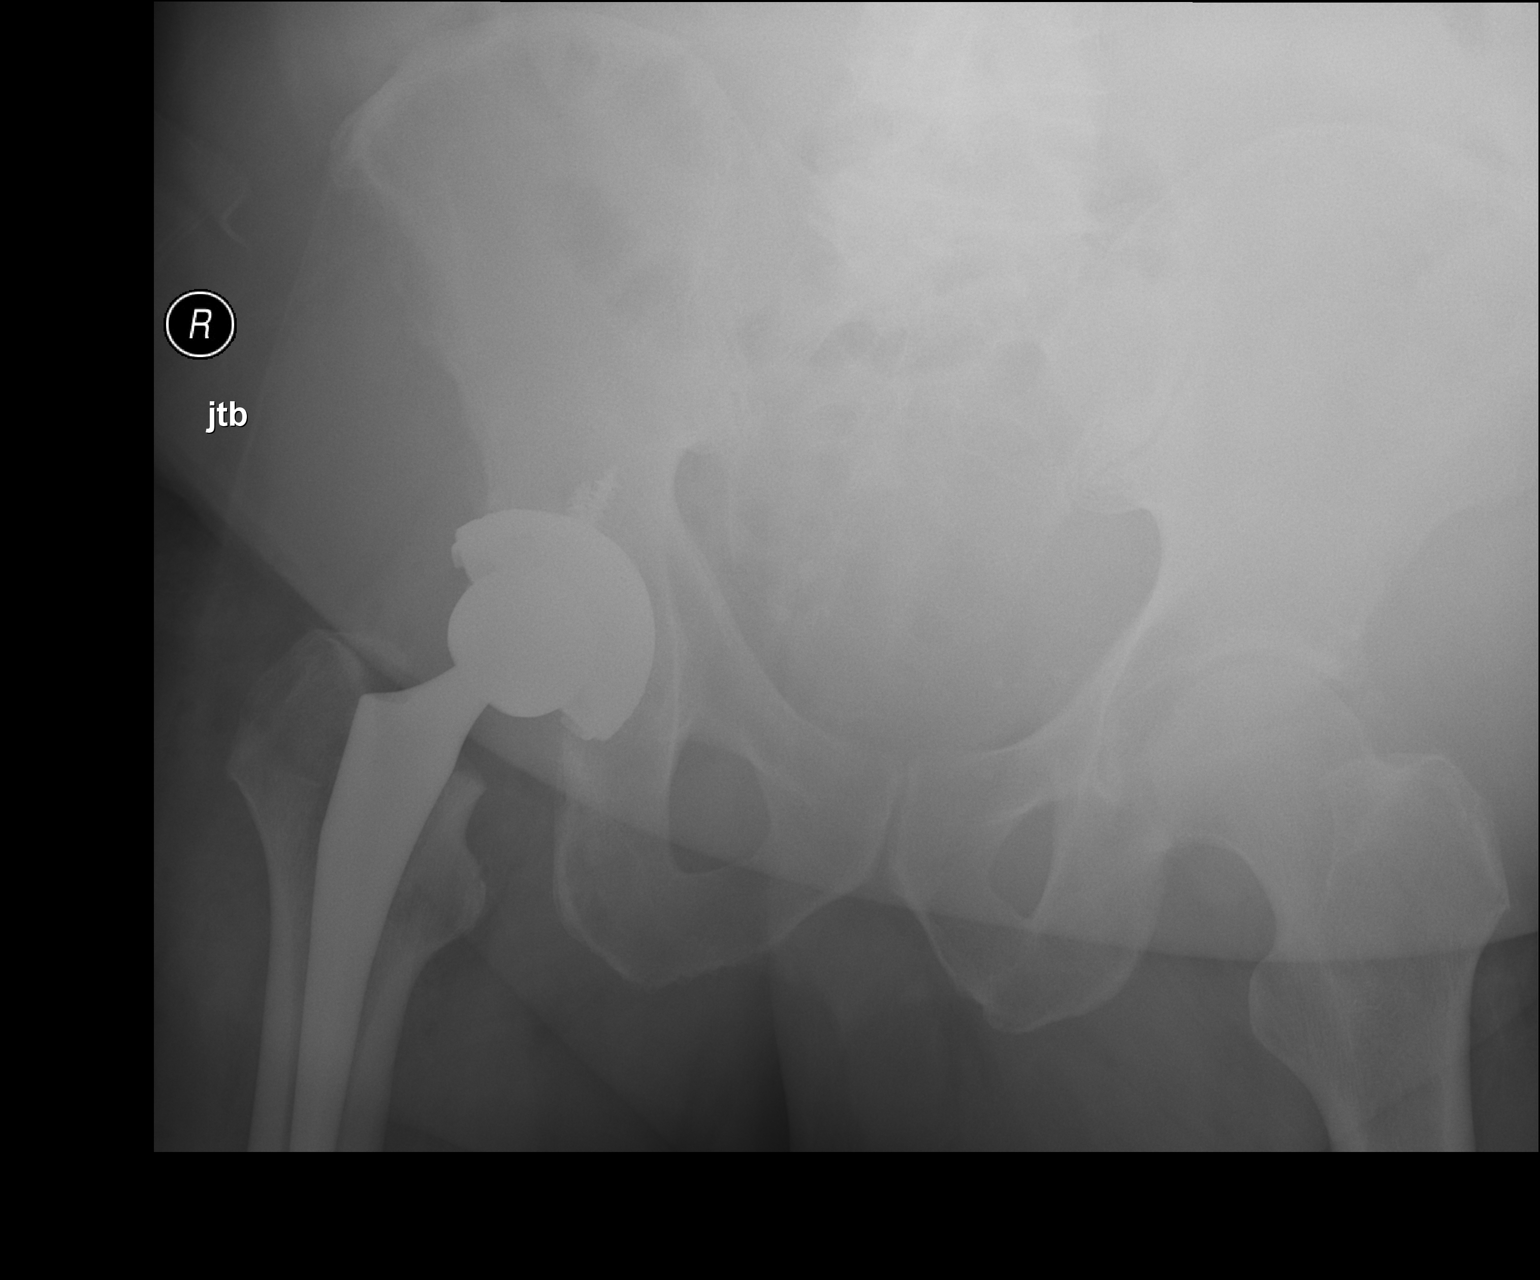

[lateral]
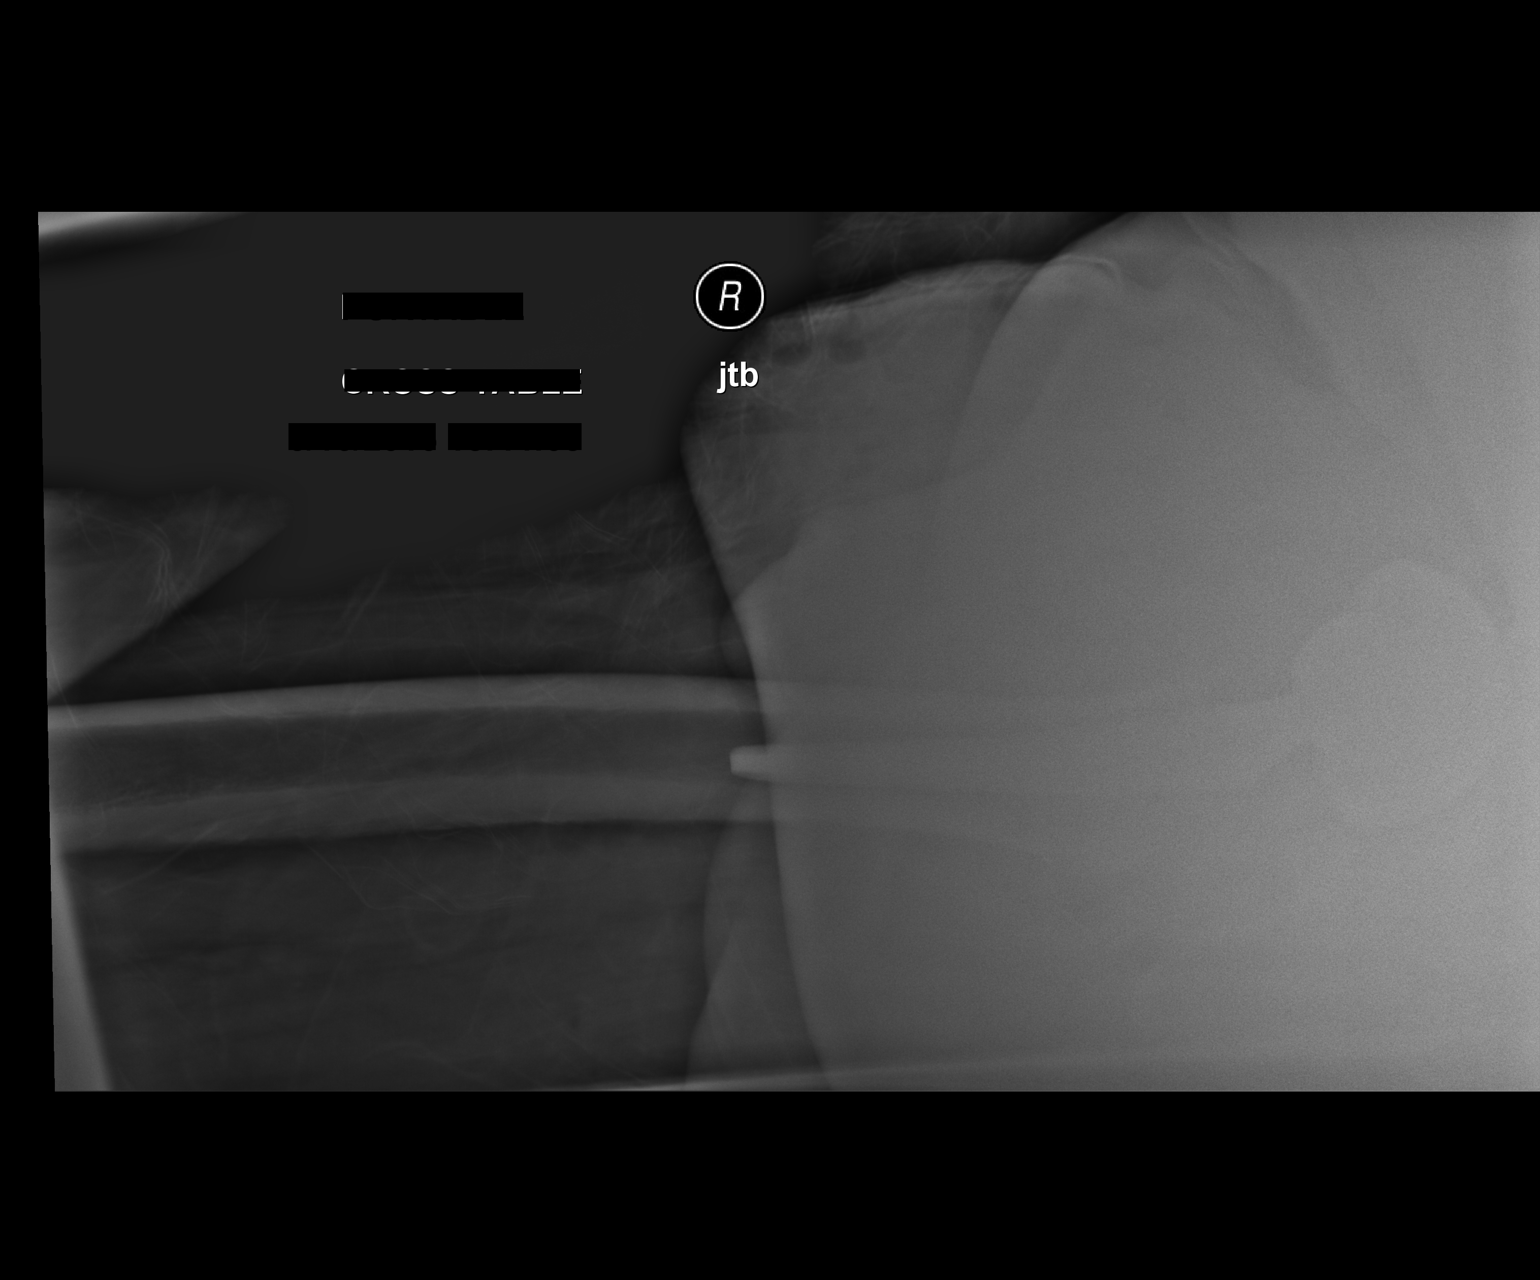

[2 of 2 positions shown; findings below may reference images not displayed]

FINDINGS: Bony pelvic ring appears intact, with no displaced fracture.

Surgical changes of right total hip arthroplasty. No complicating
features identified.

Left tip projects normally over the acetabula. Osteoarthritis of the
left hip. Visualized aspects of the left proximal fever
unremarkable.

Degenerative changes of the lumbar spine.

Bridging osteophyte production of the bilateral sacroiliac joints.
IMPRESSION: Surgical changes of right total hip arthroplasty without
complicating features. No comparison available.

No acute bony abnormality is identified.

Bridging osteophyte formation of the bilateral sacroiliac joints.

## 2016-04-29 ENCOUNTER — Encounter: Payer: Self-pay | Admitting: Urology

## 2016-04-29 ENCOUNTER — Ambulatory Visit (INDEPENDENT_AMBULATORY_CARE_PROVIDER_SITE_OTHER): Payer: Medicare Other | Admitting: Urology

## 2016-04-29 VITALS — BP 179/73 | HR 94 | Ht 70.0 in | Wt 289.5 lb

## 2016-04-29 DIAGNOSIS — F524 Premature ejaculation: Secondary | ICD-10-CM | POA: Diagnosis not present

## 2016-04-29 DIAGNOSIS — N401 Enlarged prostate with lower urinary tract symptoms: Secondary | ICD-10-CM | POA: Diagnosis not present

## 2016-04-29 DIAGNOSIS — N528 Other male erectile dysfunction: Secondary | ICD-10-CM | POA: Diagnosis not present

## 2016-04-29 DIAGNOSIS — N138 Other obstructive and reflux uropathy: Secondary | ICD-10-CM

## 2016-04-29 DIAGNOSIS — N400 Enlarged prostate without lower urinary tract symptoms: Secondary | ICD-10-CM | POA: Diagnosis not present

## 2016-04-29 DIAGNOSIS — Z87898 Personal history of other specified conditions: Secondary | ICD-10-CM

## 2016-04-29 DIAGNOSIS — E291 Testicular hypofunction: Secondary | ICD-10-CM | POA: Diagnosis not present

## 2016-04-29 DIAGNOSIS — N481 Balanitis: Secondary | ICD-10-CM | POA: Diagnosis not present

## 2016-04-29 DIAGNOSIS — N529 Male erectile dysfunction, unspecified: Secondary | ICD-10-CM

## 2016-04-29 LAB — BLADDER SCAN AMB NON-IMAGING: Scan Result: 222

## 2016-04-29 MED ORDER — ZINC OXIDE 20 % EX OINT: 1.0000 "application " | TOPICAL_OINTMENT | Freq: Two times a day (BID) | CUTANEOUS | Status: DC

## 2016-04-29 MED ORDER — NYSTATIN 100000 UNIT/GM EX POWD: Freq: Two times a day (BID) | CUTANEOUS | Status: DC

## 2016-04-29 NOTE — Progress Notes (Signed)
04/29/2016 11:31 AM   [name]. [birth year] [id number]  Referring provider: No referring provider defined for this encounter.  Chief Complaint  Patient presents with  . New Patient (Initial Visit)    HPI: Patient is 65 year old Caucasian male with symptoms of hypogonadism, premature ejaculation, erectile dysfunction, history of elevated PSA and BPH with LUTS who presents today for evaluation.  Patient is experiencing a decrease in libido, a lack of energy, a decrease in strength, a loss in height, erections being less strong, a recent deterioration in an ability to play sports and a recent deterioration in their work performance.  This is indicated by his responses to the ADAM questionnaire.  He is not having spontaneous erections.  He has been told that he may have sleep apnea, but he states he will not get a sleep study.         Androgen Deficiency in the Aging Male      04/29/16 0900       Androgen Deficiency in the Aging Male   Do you have a decrease in libido (sex drive) Yes     Do you have lack of energy Yes     Do you have a decrease in strength and/or endurance Yes     Have you lost height Yes     Have you noticed a decreased "enjoyment of life" No     Are you sad and/or grumpy No     Are your erections less strong Yes     Have you noticed a recent deterioration in your ability to play sports Yes     Are you falling asleep after dinner Yes     Has there been a recent deterioration in your work performance Yes        Patient states he is no longer experiencing premature ejaculation because his erectile dysfunction has become so severe over the last several months.  His SHIM score is 5, which is severe ED.   He has been having difficulty with erections for over three years.   His major complaint is he is no longer having any type of erections.  His libido is diminished.   His risk factors for ED are obesity, BPH, DM, HTN.  He denies any painful erections or  curvatures with his erections.   He has tried PDE5-inhibitors in the past with limited success.       SHIM      04/29/16 0934       SHIM: Over the last 6 months:   How do you rate your confidence that you could get and keep an erection? Very Low     When you had erections with sexual stimulation, how often were your erections hard enough for penetration (entering your partner)? Almost Never or Never     During sexual intercourse, how often were you able to maintain your erection after you had penetrated (entered) your partner? Extremely Difficult     During sexual intercourse, how difficult was it to maintain your erection to completion of intercourse? Extremely Difficult     When you attempted sexual intercourse, how often was it satisfactory for you? Extremely Difficult     SHIM Total Score   SHIM 5        Score: 1-7 Severe ED 8-11 Moderate ED 12-16 Mild-Moderate ED 17-21 Mild ED 22-25 No ED  Patient underwent a biopsy in 2011 for a PSA of 7.2 with Dr. Zannie Cove. It was negative for malignancy.  Patient states that he has not had a recent PSA and refuses to have blood work performed again.  His last PSA on record was 3.65 ng/mL in 2013.    His IPSS score today is 28, which is severe lower urinary tract symptomatology. He is mostly dissatisfied with his quality life due to his urinary symptoms. His PVR is 222 mL.  His major complaint today is urinary frequency, nocturia, intermittency, hesitancy and a weak urinary stream.  He has had these symptoms for over three years.  He denies any dysuria, hematuria or suprapubic pain.  He currently taking tamsulosin and finasteride.  He also denies any recent fevers, chills, nausea or vomiting.  He does not have a family history of PCa.      IPSS      04/29/16 0900       International Prostate Symptom Score   How often have you had the sensation of not emptying your bladder? More than half the time     How often have you had to urinate less  than every two hours? More than half the time     How often have you found you stopped and started again several times when you urinated? Almost always     How often have you found it difficult to postpone urination? Almost always     How often have you had a weak urinary stream? Almost always     How often have you had to strain to start urination? Not at All     How many times did you typically get up at night to urinate? 5 Times     Total IPSS Score 28     Quality of Life due to urinary symptoms   If you were to spend the rest of your life with your urinary condition just the way it is now how would you feel about that? Mostly Disatisfied        Score:  1-7 Mild 8-19 Moderate 20-35 Severe   PMH: Past Medical History  Diagnosis Date  . BPH (benign prostatic hypertrophy)     takes Uroxatral daily as well as Finasteride   . Arthritis   . GERD (gastroesophageal reflux disease)     no meds  . Hypertension     takes Losartan and HCTZ daily  . Joint pain   . Back pain     occasionally  . Urinary frequency   . Urinary urgency   . Hepatitis hx of    at age 3  . Diabetes mellitus without complication ([place])     takes Metformin daily    Surgical History: Past Surgical History  Procedure Laterality Date  . Appendectomy      age 80  . Knee arthroscopy  1984    right and left   . Vasectomy    . Vasectomy reversal  1993  . Colonoscopy    . Shoulder arthroscopy  2003    right RCR-GSC-stayed overnight-had SOB  . Shoulder arthroscopy with rotator cuff repair and subacromial decompression Left 03/13/2013    Procedure: LEFT SHOULDER ARTHROSCOPY WITH SUBACROMIAL DECOMPRESSION, DISTAL CLAVICLE RESECTION AND REPAIR ROTATOR CUFF AND LABRAL DEBRIDEMENT;  Surgeon: [name]., MD;  Location: [facility];  Service: Orthopedics;  Laterality: Left;  . Total hip arthroplasty Right 01/29/2015    Procedure: TOTAL HIP ARTHROPLASTY ANTERIOR APPROACH;  Surgeon: [name], MD;  Location: [facility];  Service: Orthopedics;  Laterality: Right;    Home Medications:  Medication List       This list is accurate as of: 04/29/16 11:59 PM.  Always use your most recent med list.               finasteride 5 MG tablet  Commonly known as:  PROSCAR  Take 5 mg by mouth daily.     hydrochlorothiazide 25 MG tablet  Commonly known as:  HYDRODIURIL  Take 1 tablet by mouth  daily     hydrocortisone cream-nystatin cream-zinc oxide  Apply 1 application topically 2 (two) times daily.     losartan 100 MG tablet  Commonly known as:  COZAAR  Take 1 tablet by mouth  daily     metFORMIN 500 MG tablet  Commonly known as:  GLUCOPHAGE  Take 500 mg by mouth daily with breakfast.     MILK THISTLE PO  Take 1 capsule by mouth daily.     nystatin powder  Commonly known as:  nystatin  Apply topically 2 (two) times daily.     omega-3 acid ethyl esters 1 g capsule  Commonly known as:  LOVAZA  Take by mouth 2 (two) times daily.     tamsulosin 0.4 MG Caps capsule  Commonly known as:  FLOMAX  Take 0.4 mg by mouth.     Vitamin D3 5000 units Tabs  Take 1 tablet by mouth daily. Reported on 04/29/2016        Allergies:  Allergies  Allergen Reactions  . Codeine Swelling    Water retention    Family History: Family History  Problem Relation Age of Onset  . Benign prostatic hyperplasia Father   . Kidney cancer Neg Hx   . Kidney disease Neg Hx     Social History:  reports that he has never smoked. He does not have any smokeless tobacco history on file. He reports that he does not drink alcohol or use illicit drugs.  ROS: UROLOGY Frequent Urination?: Yes Hard to postpone urination?: No Burning/pain with urination?: No Get up at night to urinate?: Yes Leakage of urine?: No Urine stream starts and stops?: Yes Trouble starting stream?: Yes Do you have to strain to urinate?: No Blood in urine?: No Urinary tract infection?: No Sexually transmitted  disease?: No Injury to kidneys or bladder?: No Painful intercourse?: No Weak stream?: Yes Erection problems?: Yes Penile pain?: No  Gastrointestinal Nausea?: No Vomiting?: No Indigestion/heartburn?: Yes Diarrhea?: Yes Constipation?: No  Constitutional Fever: No Night sweats?: No Weight loss?: No Fatigue?: No  Skin Skin rash/lesions?: No Itching?: Yes  Eyes Blurred vision?: No Double vision?: No  Ears/Nose/Throat Sore throat?: No Sinus problems?: No  Hematologic/Lymphatic Swollen glands?: No Easy bruising?: No  Cardiovascular Leg swelling?: No Chest pain?: No  Respiratory Cough?: No Shortness of breath?: No  Endocrine Excessive thirst?: No  Musculoskeletal Back pain?: Yes Joint pain?: Yes  Neurological Headaches?: No Dizziness?: No  Psychologic Depression?: No Anxiety?: No  Physical Exam: BP 179/73 mmHg  Pulse 94  Ht 5\' 10"  (1.778 m)  Wt 289 lb 8 oz (131.316 kg)  BMI 41.54 kg/m2  Constitutional: Well nourished. Alert and oriented, No acute distress. HEENT: [place] AT, moist mucus membranes. Trachea midline, no masses. Cardiovascular: No clubbing, cyanosis, or edema. Respiratory: Normal respiratory effort, no increased work of breathing. GI: Abdomen is soft, non tender, non distended, no abdominal masses. Liver and spleen not palpable.  No hernias appreciated.  Stool sample for occult testing is not indicated.   GU: No CVA tenderness.  No bladder fullness  or masses.  Patient with uncircumcised phallus.  Foreskin easily retracted  Urethral meatus is patent.  No penile discharge. Balanitis is present.   Scrotum without lesions, cysts, rashes and/or edema.  Testicles are located scrotally bilaterally. No masses are appreciated in the testicles. Left and right epididymis are normal. Rectal: Patient with  normal sphincter tone. Anus and perineum without scarring or rashes. No rectal masses are appreciated. Prostate is approximately 55 grams, no nodules are  appreciated. Seminal vesicles are normal. Skin: No rashes, bruises or suspicious lesions. Lymph: No cervical or inguinal adenopathy. Neurologic: Grossly intact, no focal deficits, moving all 4 extremities. Psychiatric: Normal mood and affect.  Laboratory Data: Lab Results  Component Value Date   WBC 10.6* 01/31/2015   HGB 10.5* 01/31/2015   HCT 31.1* 01/31/2015   MCV 89.9 01/31/2015   PLT 161 01/31/2015    Lab Results  Component Value Date   CREATININE 1.29 01/31/2015    Lab Results  Component Value Date   AST 28 01/17/2015   Lab Results  Component Value Date   ALT 32 01/17/2015      Pertinent Imaging: Results for Keith, [name] (MRN [id number]) as of 05/03/2016 11:41  Ref. Range 04/29/2016 09:54  Scan Result Unknown 222    Assessment & Plan:    1. Hypogonadism:   I explained to patient that the current recommendations from the Endocrine Society reports the diagnosis of hypogonadism requires a serum total testosterone level obtained between 8 and 10 AM at least 2 days apart that is below the laboratory parameters  for normal testosterone.  Our office obtains testosterone levels before 9 AM as we have found some insurance for these requests this time.    At this time, the patient does not meet this requirement.    I discussed with the patient the side effects of testosterone therapy, such as: enlargement of the prostate gland that may in turn cause LUTS, possible increased risk of PCa, DVT's and/or PE's, possible increased risk of heart attack or stroke, lower sperm count, swelling of the ankles, feet, or body, with or without heart failure, enlarged or painful breasts, have problems breathing while you sleep (sleep apnea), increased prostate specific antigen, mood swings, hypertension and increased red blood cell count.  I also discussed that some men have had success using clomid for hypogonadism.  It does seem to be more successful in younger men, but there are  incidences of good results in middle-aged men.  I explained that it is used in male infertility to stimulate the testicles to make more testosterone/sperm.  There has been no long term data on side effects, but some urologists has been having success with this medication.   He stated that he will not get a sleep study and that even if he did get a sleep study and was found to have sleep apnea, he would not sleep with a CPAP machine. I explained to him that I could not proceed with further evaluation of possible low testosterone unless a diagnosis of sleep apnea either confirmed or ruled out.  He also has refused PSA screening and I stated I cannot proceed with testosterone therapy with his history of elevated PSA without knowing where his PSA is at this time.    2. Premature ejaculation:   Overcome by severe ED.  3. Erectile dysfunction:   SHIM score is 5.   I explained to the patient that in order to achieve an erection it takes good functioning of  the nervous system (parasympathetic, sympathetic, sensory and motor), good blood flow into the erectile tissue of the penis and a desire to have sex.   I stated that conditions like diabetes, hypertension, coronary artery disease, peripheral vascular disease, smoking, alcohol consumption, age and BPH can diminish the ability to have an erection.   We discussed trying a different PDE5 inhibitor, intra-urethral suppositories, intracavernous vasoactive drug injection therapy, vacuum constriction device and penile prosthesis implantation.  We discussed trying a different PDE5 inhibitor, intra-urethral suppositories, intracavernous vasoactive drug injection therapy, vacuum constriction device and penile prosthesis implantation.  He stated he was not interested in pursing ED treatment at this time.  I did state that sleep apnea can contribute to ED.    4. History of elevated PSA:   Patient with a PSA of 7.2 in 2011 with a negative biopsy.  Last PSA was 3.65 ng/mL in  2013.  Patient refuses to have further PSA screening as he felt the biopsy was the most uncomfortable experience he has been through and he read that men die of something else before they die of prostate cancer.  I did advise him that the AUA Guidelines recommend PSA screening at his age as an aggressive prostate cancer could result in his death.  He continues to decline.    5. BPH with LUTS:  Patient's IPSS score is 28/4.  His PVR 222 mL.  He has failed medical treatment.  I explained that it would be of value to undergo a cystoscopy at this time to evaluate for BOO.  I have explained that the cystoscopy consists of passing a tube with a lens up through their urethra and into their urinary bladder.   We will inject the urethra with a lidocaine gel prior to introducing the cystoscope to help with any discomfort during the procedure.   After the procedure, they might experience blood in the urine and discomfort with urination.  This will abate after the first few voids.  I have  encouraged the patient to increase water intake  during this time.  Patient denies any allergies to lidocaine.     6. Balanitis:   Prescribed Mycolog cream.  Instructed to pull back the foreskin, wash with soapy water, dry the dead of the penis with a hairdryer and apply the cream twice daily.    Return for schedule cystoscopy for BOO.  These notes generated with voice recognition software. I apologize for typographical errors.  Zara Council, [facility] [address], [place], [place] [address] [phone]

## 2016-06-01 ENCOUNTER — Other Ambulatory Visit: Payer: Medicare Other

## 2016-12-17 ENCOUNTER — Encounter (HOSPITAL_COMMUNITY): Payer: Self-pay | Admitting: Emergency Medicine

## 2016-12-18 ENCOUNTER — Encounter (HOSPITAL_COMMUNITY): Payer: Self-pay | Admitting: Family Medicine

## 2016-12-18 DIAGNOSIS — E43 Unspecified severe protein-calorie malnutrition: Secondary | ICD-10-CM | POA: Diagnosis not present

## 2016-12-18 DIAGNOSIS — J189 Pneumonia, unspecified organism: Secondary | ICD-10-CM | POA: Diagnosis not present

## 2016-12-18 DIAGNOSIS — A419 Sepsis, unspecified organism: Secondary | ICD-10-CM | POA: Diagnosis not present

## 2016-12-30 ENCOUNTER — Other Ambulatory Visit: Payer: Self-pay

## 2017-05-20 ENCOUNTER — Other Ambulatory Visit: Payer: Self-pay | Admitting: Family Medicine

## 2017-05-20 DIAGNOSIS — R609 Edema, unspecified: Secondary | ICD-10-CM

## 2017-05-25 ENCOUNTER — Ambulatory Visit (HOSPITAL_COMMUNITY): Payer: Medicare Other | Attending: Cardiovascular Disease

## 2017-05-25 ENCOUNTER — Other Ambulatory Visit: Payer: Self-pay

## 2017-05-25 DIAGNOSIS — I420 Dilated cardiomyopathy: Secondary | ICD-10-CM | POA: Diagnosis not present

## 2017-05-25 DIAGNOSIS — R609 Edema, unspecified: Secondary | ICD-10-CM

## 2017-05-25 DIAGNOSIS — I510 Cardiac septal defect, acquired: Secondary | ICD-10-CM | POA: Diagnosis not present

## 2017-05-25 DIAGNOSIS — I5030 Unspecified diastolic (congestive) heart failure: Secondary | ICD-10-CM | POA: Diagnosis not present

## 2017-05-30 ENCOUNTER — Other Ambulatory Visit (HOSPITAL_COMMUNITY): Payer: Self-pay

## 2017-06-07 ENCOUNTER — Ambulatory Visit (INDEPENDENT_AMBULATORY_CARE_PROVIDER_SITE_OTHER): Payer: Medicare Other | Admitting: Cardiology

## 2017-06-07 ENCOUNTER — Encounter: Payer: Self-pay | Admitting: Cardiology

## 2017-06-07 ENCOUNTER — Encounter (INDEPENDENT_AMBULATORY_CARE_PROVIDER_SITE_OTHER): Payer: Self-pay

## 2017-06-07 VITALS — BP 146/80 | HR 89 | Ht 70.0 in | Wt 296.4 lb

## 2017-06-07 DIAGNOSIS — I5032 Chronic diastolic (congestive) heart failure: Secondary | ICD-10-CM | POA: Diagnosis not present

## 2017-06-07 DIAGNOSIS — I10 Essential (primary) hypertension: Secondary | ICD-10-CM

## 2017-06-07 DIAGNOSIS — I447 Left bundle-branch block, unspecified: Secondary | ICD-10-CM

## 2017-06-07 DIAGNOSIS — R600 Localized edema: Secondary | ICD-10-CM | POA: Diagnosis not present

## 2017-06-07 MED ORDER — FUROSEMIDE 20 MG PO TABS: 20.0000 mg | ORAL_TABLET | Freq: Every day | ORAL | 3 refills | Status: DC

## 2017-06-07 NOTE — Patient Instructions (Signed)
Medication Instructions:  Take lasix 20 mg daily-I have sent a refill to your pharmacy.  Labwork: none  Testing/Procedures: Your physician has requested that you have a lexiscan myoview. For further information please visit [email or web address]. Please follow instruction sheet, as given.     Follow-Up: Your physician recommends that you schedule a follow-up appointment in: 1 month with Cecilie Kicks or Truitt Merle.  Any Other Special Instructions Will Be Listed Below (If Applicable). Limit fluids to 1 Liter daily.  No salt in your diet.  Keep your feet and legs elevated as much as possible to help the swelling in your feet and ankles.   If you need a refill on your cardiac medications before your next appointment, please call your pharmacy.
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
## 2017-06-09 ENCOUNTER — Telehealth (HOSPITAL_COMMUNITY): Payer: Self-pay | Admitting: *Deleted

## 2017-06-09 NOTE — Telephone Encounter (Signed)
Patient given detailed instructions per Myocardial Perfusion Study Information Sheet for the test on  06/13/17. Patient notified to arrive 15 minutes early and that it is imperative to arrive on time for appointment to keep from having the test rescheduled.  If you need to cancel or reschedule your appointment, please call the office within 24 hours of your appointment. . Patient verbalized understanding.[name]

## 2017-06-13 ENCOUNTER — Ambulatory Visit (HOSPITAL_COMMUNITY): Payer: Medicare Other | Attending: Internal Medicine

## 2017-06-13 DIAGNOSIS — I509 Heart failure, unspecified: Secondary | ICD-10-CM | POA: Diagnosis not present

## 2017-06-13 DIAGNOSIS — R0609 Other forms of dyspnea: Secondary | ICD-10-CM | POA: Insufficient documentation

## 2017-06-13 DIAGNOSIS — R9439 Abnormal result of other cardiovascular function study: Secondary | ICD-10-CM | POA: Insufficient documentation

## 2017-06-13 DIAGNOSIS — I447 Left bundle-branch block, unspecified: Secondary | ICD-10-CM | POA: Diagnosis not present

## 2017-06-13 DIAGNOSIS — I2510 Atherosclerotic heart disease of native coronary artery without angina pectoris: Secondary | ICD-10-CM | POA: Diagnosis not present

## 2017-06-13 DIAGNOSIS — I110 Hypertensive heart disease with heart failure: Secondary | ICD-10-CM | POA: Insufficient documentation

## 2017-06-13 DIAGNOSIS — E119 Type 2 diabetes mellitus without complications: Secondary | ICD-10-CM | POA: Diagnosis not present

## 2017-06-13 MED ORDER — REGADENOSON 0.4 MG/5ML IV SOLN
0.4000 mg | Freq: Once | INTRAVENOUS | Status: AC
  Administered 2017-06-13: 0.4 mg via INTRAVENOUS

## 2017-06-13 MED ORDER — TECHNETIUM TC 99M TETROFOSMIN IV KIT
32.5000 | PACK | Freq: Once | INTRAVENOUS | Status: AC | PRN
  Administered 2017-06-13: 32.5 via INTRAVENOUS
  Filled 2017-06-13: qty 33

## 2017-06-14 ENCOUNTER — Ambulatory Visit (HOSPITAL_COMMUNITY): Payer: Medicare Other | Attending: Cardiology

## 2017-06-14 LAB — MYOCARDIAL PERFUSION IMAGING
LV dias vol: 191 mL (ref 62–150)
LV sys vol: 116 mL
Peak HR: 100 {beats}/min
RATE: 0.35
Rest HR: 84 {beats}/min
SDS: 1
SRS: 4
SSS: 5
TID: 1.08

## 2017-06-14 MED ORDER — TECHNETIUM TC 99M TETROFOSMIN IV KIT
32.8000 | PACK | Freq: Once | INTRAVENOUS | Status: AC | PRN
  Administered 2017-06-14: 32.8 via INTRAVENOUS
  Filled 2017-06-14: qty 33

## 2017-06-16 ENCOUNTER — Other Ambulatory Visit: Payer: Self-pay | Admitting: *Deleted

## 2017-06-16 MED ORDER — METOPROLOL SUCCINATE ER 25 MG PO TB24: 25.0000 mg | ORAL_TABLET | Freq: Every day | ORAL | 6 refills | Status: DC

## 2017-06-29 ENCOUNTER — Ambulatory Visit: Payer: Self-pay | Admitting: Physician Assistant

## 2017-07-08 ENCOUNTER — Ambulatory Visit: Payer: Medicare Other | Admitting: Cardiology

## 2017-07-19 ENCOUNTER — Ambulatory Visit (INDEPENDENT_AMBULATORY_CARE_PROVIDER_SITE_OTHER): Payer: Medicare Other | Admitting: Cardiology

## 2017-07-19 ENCOUNTER — Encounter: Payer: Self-pay | Admitting: Cardiology

## 2017-07-19 VITALS — BP 126/60 | HR 80 | Ht 69.0 in | Wt 293.0 lb

## 2017-07-19 DIAGNOSIS — R600 Localized edema: Secondary | ICD-10-CM | POA: Diagnosis not present

## 2017-07-19 DIAGNOSIS — I5032 Chronic diastolic (congestive) heart failure: Secondary | ICD-10-CM | POA: Diagnosis not present

## 2017-07-19 DIAGNOSIS — I10 Essential (primary) hypertension: Secondary | ICD-10-CM | POA: Diagnosis not present

## 2017-07-19 DIAGNOSIS — I447 Left bundle-branch block, unspecified: Secondary | ICD-10-CM

## 2017-07-19 DIAGNOSIS — R358 Other polyuria: Secondary | ICD-10-CM

## 2017-07-19 DIAGNOSIS — R3589 Other polyuria: Secondary | ICD-10-CM

## 2017-07-19 LAB — BASIC METABOLIC PANEL
BUN/Creatinine Ratio: 22 (ref 10–24)
BUN: 24 mg/dL (ref 8–27)
CO2: 24 mmol/L (ref 20–29)
Calcium: 8.5 mg/dL — ABNORMAL LOW (ref 8.6–10.2)
Chloride: 97 mmol/L (ref 96–106)
Creatinine, Ser: 1.1 mg/dL (ref 0.76–1.27)
GFR calc Af Amer: 79 mL/min/{1.73_m2} (ref >59)
GFR calc non Af Amer: 69 mL/min/{1.73_m2} (ref >59)
Glucose: 140 mg/dL — ABNORMAL HIGH (ref 65–99)
Potassium: 3.8 mmol/L (ref 3.5–5.2)
Sodium: 140 mmol/L (ref 134–144)

## 2017-07-19 LAB — MAGNESIUM: Magnesium: 2 mg/dL (ref 1.6–2.3)

## 2017-07-19 MED ORDER — METOPROLOL SUCCINATE ER 50 MG PO TB24: 50.0000 mg | ORAL_TABLET | Freq: Every day | ORAL | 11 refills | Status: DC

## 2017-07-19 NOTE — Patient Instructions (Signed)
Medication Instructions:  Your physician has recommended you make the following change in your medication:   INCREASE metoprolol succinate (Toprol-XL) to 50 mg daily. Take at night.   Labwork: Basic Metabolic Panel, Magnesium  Testing/Procedures: None ordered  Follow-Up: Your physician wants you to follow-up in: 4-6 weeks with Cecilie Kicks, NP or Dr. Marlou Porch.    Any Other Special Instructions Will Be Listed Below (If Applicable).     If you need a refill on your cardiac medications before your next appointment, please call your pharmacy.
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
## 2017-08-12 ENCOUNTER — Encounter: Payer: Self-pay | Admitting: Cardiology
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
## 2017-08-23 ENCOUNTER — Encounter: Payer: Self-pay | Admitting: Cardiology

## 2017-08-23 ENCOUNTER — Ambulatory Visit (INDEPENDENT_AMBULATORY_CARE_PROVIDER_SITE_OTHER): Payer: Medicare Other | Admitting: Cardiology

## 2017-08-23 ENCOUNTER — Encounter (INDEPENDENT_AMBULATORY_CARE_PROVIDER_SITE_OTHER): Payer: Self-pay

## 2017-08-23 VITALS — BP 144/60 | HR 80 | Ht 69.0 in | Wt 299.4 lb

## 2017-08-23 DIAGNOSIS — R600 Localized edema: Secondary | ICD-10-CM

## 2017-08-23 DIAGNOSIS — I10 Essential (primary) hypertension: Secondary | ICD-10-CM

## 2017-08-23 DIAGNOSIS — I5032 Chronic diastolic (congestive) heart failure: Secondary | ICD-10-CM

## 2017-08-23 DIAGNOSIS — I447 Left bundle-branch block, unspecified: Secondary | ICD-10-CM

## 2017-08-23 LAB — BASIC METABOLIC PANEL
BUN/Creatinine Ratio: 27 — ABNORMAL HIGH (ref 10–24)
BUN: 29 mg/dL — ABNORMAL HIGH (ref 8–27)
CO2: 30 mmol/L — ABNORMAL HIGH (ref 20–29)
Calcium: 9 mg/dL (ref 8.6–10.2)
Chloride: 98 mmol/L (ref 96–106)
Creatinine, Ser: 1.08 mg/dL (ref 0.76–1.27)
GFR calc Af Amer: 81 mL/min/{1.73_m2} (ref >59)
GFR calc non Af Amer: 70 mL/min/{1.73_m2} (ref >59)
Glucose: 158 mg/dL — ABNORMAL HIGH (ref 65–99)
Potassium: 4.3 mmol/L (ref 3.5–5.2)
Sodium: 141 mmol/L (ref 134–144)

## 2017-08-23 MED ORDER — FUROSEMIDE 20 MG PO TABS: 20.0000 mg | ORAL_TABLET | Freq: Every day | ORAL | 3 refills | Status: DC

## 2017-08-23 MED ORDER — POTASSIUM CHLORIDE CRYS ER 20 MEQ PO TBCR: 20.0000 meq | EXTENDED_RELEASE_TABLET | Freq: Every day | ORAL | 3 refills | Status: DC

## 2017-08-23 NOTE — Patient Instructions (Addendum)
Medication Instructions:  Your physician has recommended you make the following change in your medication:  Stop hydrochlorothiazide.  Increase furosemide to 60 mg by mouth for 2 days then decease to 40 mg by mouth daily for 6 days.  After this change to furosemide 20 mg by mouth daily except on Wednesday take 40 mg by mouth.  Start potassium 20 meq by mouth daily.    Labwork: Lab work to be done today--BMP  Testing/Procedures: none  Follow-Up: Your physician recommends that you schedule a follow-up appointment in: 2 weeks with Cecilie Kicks, NP--Scheduled for October 29,2018 at 11:30  Your physician recommends that you schedule a follow-up appointment in: 3-4 months with Dr. Marlou Porch. --Scheduled for January 22,2019 at 8:00      Any Other Special Instructions Will Be Listed Below (If Applicable).  Limit fluid intake to 2 liters daily. Follow 2 gram sodium diet.    Fluid Restriction Some health conditions may require you to restrict your fluid intake. This means that you need to limit the amount of fluid you drink each day. When you have a fluid restriction, you must carefully measure and keep track of the amount of fluid you drink. Your health care provider will identify the specific amount of fluid you are allowed each day. This amount may depend on several things, such as:  The amount of urine you produce in a day.  How much fluid you are keeping (retaining) in your body.  Your blood pressure.  What is my plan? Your health care provider recommends that you limit your fluid intake to ____2 liters______ per day. What counts toward my fluid intake? Your fluid intake includes all liquids that you drink, as well as any foods that become liquid at room temperature. The following are examples of some fluids that you will have to restrict:  Tea, coffee, soda, lemonade, milk, water, juice, sport drinks, and nutritional supplement beverages.  Alcoholic  beverages.  Cream.  Gravy.  Ice cubes.  Soup and broth.  The following are examples of foods that become liquid at room temperature. These foods will also count toward your fluid intake.  Ice cream and ice milk.  Frozen yogurt and sherbet.  Frozen ice pops.  Flavored gelatin.  How do I keep track of my fluid intake? Each morning, fill a jug with the amount of water that equals the amount of fluid you are allowed for the day. You can use this water as a guideline for fluid allowance. Each time you take in any form of fluid, including ice cubes and foods that become liquid at room temperature, pour an equal amount of water out of the container. This helps you to see how much fluid you are taking in. It also helps you to see how much of your fluid intake is left for the rest of the day. The following conversions may also be helpful in measuring your fluid intake:  1 cup equals 8 oz (240 mL).   cup equals 6 oz (180 mL).  ? cup equals 5? oz (160 mL).   cup equals 4 oz (120 mL).  ? cup equals 2? oz (80 mL).   cup equals 2 oz (60 mL).  2 Tbsp equals 1 oz (30 mL).  What home care instructions should I follow while restricting fluids?  Make sure that you stay within the recommended limit each day. Always measure and keep track of your fluids, as well as any foods that turn liquid at room temperature.  Use  small cups and glasses and learn to sip fluids slowly.  Add a slice of fresh lemon or lemon juice to water or ice. This helps to satisfy your thirst.  Freeze fruit juice or water in an ice cube tray. Use this as part of your fluid allowance. These cubes are useful for quenching your thirst. Measure the amount of liquid in each ice cube prior to freezing so you can subtract this amount from your day's allowance when you consume each frozen cube.  Try frozen fruits between meals, such as grapes or strawberries.  Swallow your pills along with meals or soft foods, such as  applesauce or mashed potatoes. This helps you to save your fluid allowance for something that you enjoy.  Weigh yourself every day. Keeping track of your daily weight can help you and your health care provider to notice as soon as possible if you are retaining too much fluid in your body. ? Weigh yourself every morning after you urinate but before you eat breakfast. ? Wear the same amount of clothing each time you weigh yourself. ? Write down your daily weight. Give this weight record to your health care provider. If your weight is going up, you may be retaining too much fluid. Every 2 cups (480 mL) of fluid retained in the body becomes an extra 1 lb (0.45 kg) of body weight.  Avoid salty foods. These foods make you thirsty and make fluid control more difficult.  Brush your teeth often or rinse your mouth with mouthwash to help your dry mouth. Lemon wedges, hard sour candies, chewing gum, or breath spray may also help to moisten your mouth.  Keep the temperature in your home at a cooler level. Dry air increases thirst, so keep the air in your home as humid as possible.  Avoid being out in the hot sun, which can cause you to sweat and become thirsty. What are some signs that I may be taking in too much fluid? You may be taking in too much fluid if:  Your weight increases. Contact your health care provider if your weight increases 3 lb or more in a day or if it increases 5 lb or more in a week.  Your face, hands, legs, feet, and belly (abdomen) start to swell.  You have trouble breathing.  This information is not intended to replace advice given to you by your health care provider. Make sure you discuss any questions you have with your health care provider. Document Released: 08/29/2007 Document Revised: 04/08/2016 Document Reviewed: 04/02/2014 Elsevier Interactive Patient Education  2018 [employer].  Low-Sodium Eating Plan Sodium, which is an element that makes up salt, helps you  maintain a healthy balance of fluids in your body. Too much sodium can increase your blood pressure and cause fluid and waste to be held in your body. Your health care provider or dietitian may recommend following this plan if you have high blood pressure (hypertension), kidney disease, liver disease, or heart failure. Eating less sodium can help lower your blood pressure, reduce swelling, and protect your heart, liver, and kidneys. What are tips for following this plan? General guidelines  Most people on this plan should limit their sodium intake to 1,500-2,000 mg (milligrams) of sodium each day. Reading food labels  The Nutrition Facts label lists the amount of sodium in one serving of the food. If you eat more than one serving, you must multiply the listed amount of sodium by the number of servings.  Choose foods  with less than 140 mg of sodium per serving.  Avoid foods with 300 mg of sodium or more per serving. Shopping  Look for lower-sodium products, often labeled as "low-sodium" or "no salt added."  Always check the sodium content even if foods are labeled as "unsalted" or "no salt added".  Buy fresh foods. ? Avoid canned foods and premade or frozen meals. ? Avoid canned, cured, or processed meats  Buy breads that have less than 80 mg of sodium per slice. Cooking  Eat more home-cooked food and less restaurant, buffet, and fast food.  Avoid adding salt when cooking. Use salt-free seasonings or herbs instead of table salt or sea salt. Check with your health care provider or pharmacist before using salt substitutes.  Cook with plant-based oils, such as canola, sunflower, or olive oil. Meal planning  When eating at a restaurant, ask that your food be prepared with less salt or no salt, if possible.  Avoid foods that contain MSG (monosodium glutamate). MSG is sometimes added to [place] food, bouillon, and some canned foods. What foods are recommended? The items listed may not  be a complete list. Talk with your dietitian about what dietary choices are best for you. Grains Low-sodium cereals, including oats, puffed wheat and rice, and shredded wheat. Low-sodium crackers. Unsalted rice. Unsalted pasta. Low-sodium bread. Whole-grain breads and whole-grain pasta. Vegetables Fresh or frozen vegetables. "No salt added" canned vegetables. "No salt added" tomato sauce and paste. Low-sodium or reduced-sodium tomato and vegetable juice. Fruits Fresh, frozen, or canned fruit. Fruit juice. Meats and other protein foods Fresh or frozen (no salt added) meat, poultry, seafood, and fish. Low-sodium canned tuna and salmon. Unsalted nuts. Dried peas, beans, and lentils without added salt. Unsalted canned beans. Eggs. Unsalted nut butters. Dairy Milk. Soy milk. Cheese that is naturally low in sodium, such as ricotta cheese, fresh mozzarella, or Swiss cheese Low-sodium or reduced-sodium cheese. Cream cheese. Yogurt. Fats and oils Unsalted butter. Unsalted margarine with no trans fat. Vegetable oils such as canola or olive oils. Seasonings and other foods Fresh and dried herbs and spices. Salt-free seasonings. Low-sodium mustard and ketchup. Sodium-free salad dressing. Sodium-free light mayonnaise. Fresh or refrigerated horseradish. Lemon juice. Vinegar. Homemade, reduced-sodium, or low-sodium soups. Unsalted popcorn and pretzels. Low-salt or salt-free chips. What foods are not recommended? The items listed may not be a complete list. Talk with your dietitian about what dietary choices are best for you. Grains Instant hot cereals. Bread stuffing, pancake, and biscuit mixes. Croutons. Seasoned rice or pasta mixes. Noodle soup cups. Boxed or frozen macaroni and cheese. Regular salted crackers. Self-rising flour. Vegetables Sauerkraut, pickled vegetables, and relishes. Olives. [place] fries. Onion rings. Regular canned vegetables (not low-sodium or reduced-sodium). Regular canned tomato sauce  and paste (not low-sodium or reduced-sodium). Regular tomato and vegetable juice (not low-sodium or reduced-sodium). Frozen vegetables in sauces. Meats and other protein foods Meat or fish that is salted, canned, smoked, spiced, or pickled. Bacon, ham, sausage, hotdogs, corned beef, chipped beef, packaged lunch meats, salt pork, jerky, pickled herring, anchovies, regular canned tuna, sardines, salted nuts. Dairy Processed cheese and cheese spreads. Cheese curds. Blue cheese. Feta cheese. String cheese. Regular cottage cheese. Buttermilk. Canned milk. Fats and oils Salted butter. Regular margarine. Ghee. Bacon fat. Seasonings and other foods Onion salt, garlic salt, seasoned salt, table salt, and sea salt. Canned and packaged gravies. Worcestershire sauce. Tartar sauce. Barbecue sauce. Teriyaki sauce. Soy sauce, including reduced-sodium. Steak sauce. Fish sauce. Oyster sauce. Cocktail sauce. Horseradish that you  find on the shelf. Regular ketchup and mustard. Meat flavorings and tenderizers. Bouillon cubes. Hot sauce and Tabasco sauce. Premade or packaged marinades. Premade or packaged taco seasonings. Relishes. Regular salad dressings. Salsa. Potato and tortilla chips. Corn chips and puffs. Salted popcorn and pretzels. Canned or dried soups. Pizza. Frozen entrees and pot pies. Summary  Eating less sodium can help lower your blood pressure, reduce swelling, and protect your heart, liver, and kidneys.  Most people on this plan should limit their sodium intake to 1,500-2,000 mg (milligrams) of sodium each day.  Canned, boxed, and frozen foods are high in sodium. Restaurant foods, fast foods, and pizza are also very high in sodium. You also get sodium by adding salt to food.  Try to cook at home, eat more fresh fruits and vegetables, and eat less fast food, canned, processed, or prepared foods. This information is not intended to replace advice given to you by your health care provider. Make sure you  discuss any questions you have with your health care provider. Document Released: 04/23/2002 Document Revised: 10/25/2016 Document Reviewed: 10/25/2016 Elsevier Interactive Patient Education  2017 [employer].    If you need a refill on your cardiac medications before your next appointment, please call your pharmacy.
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
## 2017-09-12 ENCOUNTER — Encounter: Payer: Self-pay | Admitting: Cardiology

## 2017-09-12 ENCOUNTER — Ambulatory Visit (INDEPENDENT_AMBULATORY_CARE_PROVIDER_SITE_OTHER): Payer: Medicare Other | Admitting: Cardiology

## 2017-09-12 VITALS — BP 124/72 | HR 90 | Ht 68.0 in | Wt 302.4 lb

## 2017-09-12 DIAGNOSIS — I447 Left bundle-branch block, unspecified: Secondary | ICD-10-CM

## 2017-09-12 DIAGNOSIS — I10 Essential (primary) hypertension: Secondary | ICD-10-CM | POA: Diagnosis not present

## 2017-09-12 DIAGNOSIS — R600 Localized edema: Secondary | ICD-10-CM | POA: Diagnosis not present

## 2017-09-12 MED ORDER — FUROSEMIDE 40 MG PO TABS: 40.0000 mg | ORAL_TABLET | Freq: Every day | ORAL | 3 refills | Status: DC

## 2017-09-12 NOTE — Patient Instructions (Addendum)
Medication Instructions:  1) INCREASE Furosemide to 40mg  once daily.  Labwork: Your physician recommends that you return for lab work in: 1 week (BMET)   Testing/Procedures: None  Follow-Up: Your physician recommends that you schedule a follow-up appointment in January with Dr. Marlou Porch as previously scheduled.   Any Other Special Instructions Will Be Listed Below (If Applicable).  Your provider recommends that you wear compression stockings daily.    If you need a refill on your cardiac medications before your next appointment, please call your pharmacy.

## 2017-09-19 ENCOUNTER — Other Ambulatory Visit: Payer: Medicare Other

## 2017-09-26 ENCOUNTER — Other Ambulatory Visit: Payer: Medicare Other | Admitting: *Deleted

## 2017-09-26 DIAGNOSIS — I10 Essential (primary) hypertension: Secondary | ICD-10-CM

## 2017-09-26 LAB — BASIC METABOLIC PANEL
BUN/Creatinine Ratio: 21 (ref 10–24)
BUN: 22 mg/dL (ref 8–27)
CO2: 26 mmol/L (ref 20–29)
Calcium: 8.7 mg/dL (ref 8.6–10.2)
Chloride: 97 mmol/L (ref 96–106)
Creatinine, Ser: 1.04 mg/dL (ref 0.76–1.27)
GFR calc Af Amer: 85 mL/min/{1.73_m2} (ref >59)
GFR calc non Af Amer: 73 mL/min/{1.73_m2} (ref >59)
Glucose: 162 mg/dL — ABNORMAL HIGH (ref 65–99)
Potassium: 4.2 mmol/L (ref 3.5–5.2)
Sodium: 140 mmol/L (ref 134–144)

## 2017-12-06 ENCOUNTER — Ambulatory Visit: Payer: Medicare Other | Admitting: Cardiology

## 2017-12-06 ENCOUNTER — Encounter: Payer: Self-pay | Admitting: Cardiology

## 2017-12-06 ENCOUNTER — Encounter (INDEPENDENT_AMBULATORY_CARE_PROVIDER_SITE_OTHER): Payer: Self-pay

## 2017-12-06 DIAGNOSIS — I10 Essential (primary) hypertension: Secondary | ICD-10-CM

## 2017-12-06 DIAGNOSIS — I5032 Chronic diastolic (congestive) heart failure: Secondary | ICD-10-CM

## 2017-12-06 DIAGNOSIS — R600 Localized edema: Secondary | ICD-10-CM | POA: Diagnosis not present

## 2017-12-06 DIAGNOSIS — I447 Left bundle-branch block, unspecified: Secondary | ICD-10-CM

## 2017-12-06 MED ORDER — METOPROLOL SUCCINATE ER 50 MG PO TB24: 50.0000 mg | ORAL_TABLET | Freq: Every day | ORAL | 3 refills | Status: DC
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
## 2017-12-06 NOTE — Patient Instructions (Signed)
Medication Instructions:  Your physician recommends that you continue on your current medications as directed. Please refer to the Current Medication list given to you today.  Labwork: None  Testing/Procedures: None  Follow-Up: Your physician wants you to follow-up in: 6 months with Cecilie Kicks, NP.  You will receive a reminder letter in the mail two months in advance. If you don't receive a letter, please call our office to schedule the follow-up appointment.  Your physician wants you to follow-up in: 1 year with Dr. Marlou Porch. You will receive a reminder letter in the mail two months in advance. If you don't receive a letter, please call our office to schedule the follow-up appointment.   Any Other Special Instructions Will Be Listed Below (If Applicable).     If you need a refill on your cardiac medications before your next appointment, please call your pharmacy.

## 2018-06-02 ENCOUNTER — Telehealth: Payer: Self-pay | Admitting: Cardiology

## 2018-06-02 NOTE — Telephone Encounter (Signed)
New Message      Pt c/o swelling: STAT is pt has developed SOB within 24 hours  1) How much weight have you gained and in what time span? No   2) If swelling, where is the swelling located? Both legs/feet  3) Are you currently taking a fluid pill? No  4) Are you currently SOB? No  5) Do you have a log of your daily weights (if so, list)? No  6) Have you gained 3 pounds in a day or 5 pounds in a week? Uknown  7) Have you traveled recently? No         Patient states that's his legs swollen and his skin is real tight and ripping and he is uncomfortable.

## 2018-06-02 NOTE — Telephone Encounter (Signed)
Per pt -  Has been on Lasix for sometime and sometimes it seems to help and sometimes it doesn't.  He does not weigh daily so does not known if it is elevated.  Has edema in feet and legs bilaterally with "real tight skin and blisters".  Has some SOB or a feeling like it's hard to take a real deep breath.  Does not occur all the time.  Sits in a recliner with legs elevated a lot.  Does not monitor fluid or NA+ intake.  Does not wear knee high compression hose.  Advised I will review with Dr Marlou Porch and c/b with further instructions.   Advised he should be weighing daily at the same time every day, elevate feet and legs as much as possible and limit fluid and NA+ intake.

## 2018-06-02 NOTE — Telephone Encounter (Signed)
Pt aware of instructions/orders.  He will increase Lasix to 40 mg BID.  appt scheduled for f/u 06/15/18 at 9:30 am.  He is aware and agreeable.

## 2018-06-02 NOTE — Telephone Encounter (Signed)
Increase Lasix to 40 mg p.o. twice a day.  Agree with other instructions. Candee Furbish, MD

## 2018-06-12 ENCOUNTER — Other Ambulatory Visit: Payer: Self-pay | Admitting: Cardiology

## 2018-06-15 ENCOUNTER — Ambulatory Visit: Payer: Medicare Other | Admitting: Cardiology

## 2018-06-15 ENCOUNTER — Encounter: Payer: Self-pay | Admitting: Cardiology

## 2018-06-15 VITALS — BP 130/70 | HR 87 | Ht 68.0 in | Wt 313.0 lb

## 2018-06-15 DIAGNOSIS — I5032 Chronic diastolic (congestive) heart failure: Secondary | ICD-10-CM | POA: Diagnosis not present

## 2018-06-15 DIAGNOSIS — R600 Localized edema: Secondary | ICD-10-CM

## 2018-06-15 DIAGNOSIS — I10 Essential (primary) hypertension: Secondary | ICD-10-CM | POA: Diagnosis not present

## 2018-06-15 DIAGNOSIS — I447 Left bundle-branch block, unspecified: Secondary | ICD-10-CM

## 2018-06-15 LAB — BASIC METABOLIC PANEL
BUN/Creatinine Ratio: 18 (ref 10–24)
BUN: 19 mg/dL (ref 8–27)
CO2: 28 mmol/L (ref 20–29)
Calcium: 8.4 mg/dL — ABNORMAL LOW (ref 8.6–10.2)
Chloride: 101 mmol/L (ref 96–106)
Creatinine, Ser: 1.08 mg/dL (ref 0.76–1.27)
GFR calc Af Amer: 81 mL/min/{1.73_m2} (ref >59)
GFR calc non Af Amer: 70 mL/min/{1.73_m2} (ref >59)
Glucose: 197 mg/dL — ABNORMAL HIGH (ref 65–99)
Potassium: 4.3 mmol/L (ref 3.5–5.2)
Sodium: 143 mmol/L (ref 134–144)

## 2018-06-15 LAB — MAGNESIUM: Magnesium: 1.9 mg/dL (ref 1.6–2.3)

## 2018-06-15 MED ORDER — TORSEMIDE 20 MG PO TABS: 40.0000 mg | ORAL_TABLET | Freq: Two times a day (BID) | ORAL | 3 refills | Status: DC

## 2018-06-15 NOTE — Patient Instructions (Addendum)
Medication Instructions:  1. STOP LASIX  2. START TORSEMIDE 20 MG. TAKE 2 TABLET (40MG ) TWICE DAILY.  Labwork: TODAY: BMET, MAGNESIUM   Testing/Procedures: NONE OREDRED TODAY  Follow-Up: WITH [facility] WEAVER ON AUGUST 6TH AT 11:15 AM. PLEASE ARRIVE 15 MINUTES EARLY FOR APPOINTMENT.   Any Other Special Instructions Will Be Listed Below (If Applicable). IF FEELING SHORT OF BREATH PLEASE GO TO [facility].      If you need a refill on your cardiac medications before your next appointment, please call your pharmacy.
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
## 2018-06-16 ENCOUNTER — Telehealth: Payer: Self-pay | Admitting: *Deleted

## 2018-06-16 MED ORDER — POTASSIUM CHLORIDE CRYS ER 20 MEQ PO TBCR: 20.0000 meq | EXTENDED_RELEASE_TABLET | Freq: Two times a day (BID) | ORAL | 3 refills | Status: DC

## 2018-06-16 NOTE — Telephone Encounter (Signed)
-----   Message from Isaiah Serge, NP sent at 06/15/2018  4:21 PM EDT ----- With his demadex lets add kdur 20 meq BID,  Kidney function and magnesium are normal.  Glucose was up.

## 2018-06-20 ENCOUNTER — Encounter: Payer: Self-pay | Admitting: Physician Assistant

## 2018-06-20 ENCOUNTER — Ambulatory Visit: Payer: Medicare Other | Admitting: Physician Assistant

## 2018-06-20 VITALS — BP 120/80 | HR 91 | Ht 68.0 in | Wt 300.8 lb

## 2018-06-20 DIAGNOSIS — I5032 Chronic diastolic (congestive) heart failure: Secondary | ICD-10-CM

## 2018-06-20 DIAGNOSIS — I10 Essential (primary) hypertension: Secondary | ICD-10-CM

## 2018-06-20 DIAGNOSIS — L03116 Cellulitis of left lower limb: Secondary | ICD-10-CM | POA: Diagnosis not present

## 2018-06-20 DIAGNOSIS — E118 Type 2 diabetes mellitus with unspecified complications: Secondary | ICD-10-CM | POA: Diagnosis not present

## 2018-06-20 DIAGNOSIS — I5033 Acute on chronic diastolic (congestive) heart failure: Secondary | ICD-10-CM | POA: Insufficient documentation

## 2018-06-20 LAB — BASIC METABOLIC PANEL
BUN/Creatinine Ratio: 27 — ABNORMAL HIGH (ref 10–24)
BUN: 44 mg/dL — ABNORMAL HIGH (ref 8–27)
CO2: 25 mmol/L (ref 20–29)
Calcium: 8.1 mg/dL — ABNORMAL LOW (ref 8.6–10.2)
Chloride: 95 mmol/L — ABNORMAL LOW (ref 96–106)
Creatinine, Ser: 1.65 mg/dL — ABNORMAL HIGH (ref 0.76–1.27)
GFR calc Af Amer: 48 mL/min/{1.73_m2} — ABNORMAL LOW (ref >59)
GFR calc non Af Amer: 42 mL/min/{1.73_m2} — ABNORMAL LOW (ref >59)
Glucose: 190 mg/dL — ABNORMAL HIGH (ref 65–99)
Potassium: 4.4 mmol/L (ref 3.5–5.2)
Sodium: 138 mmol/L (ref 134–144)

## 2018-06-20 MED ORDER — CEPHALEXIN 500 MG PO CAPS: 500.0000 mg | ORAL_CAPSULE | Freq: Two times a day (BID) | ORAL | 0 refills | Status: DC

## 2018-06-20 NOTE — Patient Instructions (Signed)
Medication Instructions:  1. AN RX FOR KEFLEX HAS BEEN SENT TO YOUR PHARMACY ; TAKE 1 CAP TWICE DAILY FOR 7 DAYS THEN STOP  Labwork: TODAY BMET  Testing/Procedures: NONE ORDERED TODAY  Follow-Up: DR. Marlou Porch OR APP ON CARE TEAM  Any Other Special Instructions Will Be Listed Below (If Applicable). IF LEG WOUND DOES NOT GET ANY BETTER AFTER 1 WEEK YOU HAVE BEEN ADVISED TO FOLLOW UP WITH PRIMARY CARE.    If you need a refill on your cardiac medications before your next appointment, please call your pharmacy.

## 2018-06-20 NOTE — Progress Notes (Signed)
Cardiology Office Note:    Date:  06/20/2018   ID:  Keith Reading., DOB Nov 21, 1950, MRN [id number]  PCP:  Aura Dials, MD  Cardiologist:  Candee Furbish, MD   Referring MD: Aura Dials, MD   Chief Complaint  Patient presents with  . Follow-up    CHF    History of Present Illness:    Keith Marchetta. is a 67 y.o. male with diastolic heart failure, hypertension, diabetes, morbid obesity, left bundle branch block.  Echocardiogram in July 2018 demonstrated normal LV function with moderate diastolic dysfunction.  Nuclear stress test demonstrated no ischemia.  He was last seen by Cecilie Kicks, NP 06/15/2018 for worsening lower extremity swelling and weight gain.  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  Therefore, Lasix was changed to torsemide 40 mg twice daily.  [name] returns for close follow-up on diastolic heart failure.  He is here alone.  He has lost about 13 pounds since last week.  His lower extremity swelling is also improved.  He denies shortness of breath, chest pain, paroxysmal nocturnal dyspnea, syncope.  He denies any fevers or chills.  Prior CV studies:   The following studies were reviewed today:  Nuclear stress test 06/14/2017 EF 39, LBBB related septal perfusion artifact, no significant ischemia  Echo 05/25/2017 Moderate concentric LVH, EF 50, anteroseptal/inferior/inferoseptal hypokinesis, grade 2 diastolic dysfunction, moderate LAE, normal RVSF  Past Medical History:  Diagnosis Date  . Arthritis   . Back pain    occasionally  . BPH (benign prostatic hypertrophy)    takes Uroxatral daily as well as Finasteride   . Diabetes mellitus without complication ([place])    takes Metformin daily  . GERD (gastroesophageal reflux disease)    no meds  . Hepatitis hx of   at age 54  . Hypertension    takes Losartan and HCTZ daily  . Joint pain   . Urinary frequency   . Urinary urgency    Surgical Hx: The  patient  has a past surgical history that includes Appendectomy; Knee arthroscopy (1984); Vasectomy; Vasectomy reversal (1993); Colonoscopy; Shoulder arthroscopy (2003); Shoulder arthroscopy with rotator cuff repair and subacromial decompression (Left, 03/13/2013); and Total hip arthroplasty (Right, 01/29/2015).   Current Medications: Current Meds  Medication Sig  . Cholecalciferol (VITAMIN D3) 5000 UNITS TABS Take 1 tablet by mouth daily. Reported on 04/29/2016  . finasteride (PROSCAR) 5 MG tablet Take 5 mg by mouth daily.  Marland Kitchen losartan (COZAAR) 100 MG tablet Take 1 tablet by mouth  daily  . metFORMIN (GLUCOPHAGE) 500 MG tablet Take 500 mg by mouth 2 (two) times daily with a meal.   . metoprolol succinate (TOPROL-XL) 50 MG 24 hr tablet Take 1 tablet (50 mg total) by mouth daily. Take with or immediately following a meal.  . MILK THISTLE PO Take 1 capsule by mouth daily.   . potassium chloride SA (K-DUR,KLOR-CON) 20 MEQ tablet Take 1 tablet (20 mEq total) by mouth 2 (two) times daily.  Marland Kitchen torsemide (DEMADEX) 20 MG tablet Take 2 tablets (40 mg total) by mouth 2 (two) times daily.     Allergies:   Codeine   Social History   Tobacco Use  . Smoking status: Never Smoker  . Smokeless tobacco: Former Network engineer Use Topics  . Alcohol use: No    Alcohol/week: 0.0 oz    Comment: occasionally beer or scotch  . Drug use: No  Family Hx: The patient's family history includes Benign prostatic hyperplasia in his father. There is no history of Kidney cancer or Kidney disease.  ROS:   Please see the history of present illness.    Review of Systems  Constitution: Positive for diaphoresis and malaise/fatigue.  Cardiovascular: Positive for leg swelling.   All other systems reviewed and are negative.   EKGs/Labs/Other Test Reviewed:    EKG:  EKG is not ordered today.    Recent Labs: 06/15/2018: BUN 19; Creatinine, Ser 1.08; Magnesium 1.9; Potassium 4.3; Sodium 143   Recent Lipid Panel No  results found for: CHOL, TRIG, HDL, CHOLHDL, LDLCALC, LDLDIRECT  Physical Exam:    VS:  BP 120/80   Pulse 91   Ht 5\' 8"  (1.727 m)   Wt (!) 300 lb 12.8 oz (136.4 kg)   SpO2 97%   BMI 45.74 kg/m     Wt Readings from Last 3 Encounters:  06/20/18 (!) 300 lb 12.8 oz (136.4 kg)  06/15/18 (!) 313 lb (142 kg)  12/06/17 (!) 306 lb (138.8 kg)     Physical Exam  Constitutional: He is oriented to person, place, and time. He appears well-developed and well-nourished. No distress.  HENT:  Head: Normocephalic and atraumatic.  Eyes: No scleral icterus.  Neck: Neck supple.  Cardiovascular: Normal rate and regular rhythm.  No murmur heard. Pulmonary/Chest: Effort normal. He has no rales.  Abdominal: Soft. There is no tenderness.  Musculoskeletal: He exhibits edema (1-2+ billat LE edema).  Neurological: He is alert and oriented to person, place, and time.  Skin: Skin is warm and dry.  Small open wound post L leg (achilles area) with opaque d/c  Psychiatric: He has a normal mood and affect.    ASSESSMENT & PLAN:    Chronic diastolic heart failure ([place]) He has had significant improvement in his volume excess since switching from furosemide to torsemide.  He also admits to significant dietary indiscretion.  He has made significant changes with decreasing his sodium intake.  Obtain BMET today.  If renal function and potassium stable continue current dose of torsemide.  Essential hypertension  The patient's blood pressure is controlled on his current regimen.  Continue current therapy.   Type 2 diabetes mellitus with complication, without long-term current use of insulin ([place]) Continue follow-up with primary care.  Cellulitis of left lower extremity He had blisters on his lower extremities when his legs were more swollen.  One blister opened and he does have a small wound on the left side that has some opaque drainage.  There is some mild surrounding erythema.  As he is a diabetic, I am going  to place him on antibiotic therapy to cover for the possibility of cellulitis.  Start Keflex 500 mg twice daily x7 days.  If his wound is not improving in the next week, he should follow-up with primary care for further wound management.   Dispo:  Return in about 1 month (around 07/18/2018) for Close Follow Up w/ Dr. Marlou Porch or Care Team APP.   Medication Adjustments/Labs and Tests Ordered: Current medicines are reviewed at length with the patient today.  Concerns regarding medicines are outlined above.  Tests Ordered: Orders Placed This Encounter  Procedures  . Basic Metabolic Panel (BMET)   Medication Changes: Meds ordered this encounter  Medications  . cephALEXin (KEFLEX) 500 MG capsule    Sig: Take 1 capsule (500 mg total) by mouth 2 (two) times daily.    Dispense:  14 capsule  Refill:  0    Signed, Richardson Dopp, PA-C  06/20/2018 12:04 PM    [facility] [place], [place],   [address] Phone: [phone]; Fax: [phone]

## 2018-06-21 ENCOUNTER — Telehealth: Payer: Self-pay | Admitting: *Deleted

## 2018-06-21 DIAGNOSIS — I5032 Chronic diastolic (congestive) heart failure: Secondary | ICD-10-CM

## 2018-06-21 MED ORDER — POTASSIUM CHLORIDE CRYS ER 20 MEQ PO TBCR: 20.0000 meq | EXTENDED_RELEASE_TABLET | Freq: Every day | ORAL | 3 refills | Status: DC

## 2018-06-21 MED ORDER — TORSEMIDE 20 MG PO TABS: 20.0000 mg | ORAL_TABLET | Freq: Two times a day (BID) | ORAL | 3 refills | Status: DC

## 2018-06-21 NOTE — Telephone Encounter (Signed)
Pt has been notified of lab results by phone with verbal understanding to changes made. Pt agreeable to hold Torsemide x 2 days (today and tomorrow) then resume Torsemide 20 mg BID, decrease K+ to 20 meq daily, BMET 06/26/18. Pt thanked me for the call.

## 2018-06-21 NOTE — Telephone Encounter (Signed)
-----   Message from Liliane Shi, [place] sent at 06/20/2018 10:36 PM EDT ----- The following abnormalities are noted:  Creatinine, BUN, Glucose are elevated.  The calcium is low. The potassium is normal. All other values are normal, stable or within acceptable limits. Medication changes / Follow up labs / Other changes or recommendations:   - Hold Torsemide for 2 days - Reduce K+ to 20 mEq Once daily  - After 2 days, resume Torsemide at 20 mg Twice daily  - BMET Monday 06/26/18. Richardson Dopp, PA-C 06/20/2018 10:32 PM

## 2018-06-26 ENCOUNTER — Other Ambulatory Visit: Payer: Medicare Other | Admitting: *Deleted

## 2018-06-26 DIAGNOSIS — I5032 Chronic diastolic (congestive) heart failure: Secondary | ICD-10-CM

## 2018-06-26 LAB — BASIC METABOLIC PANEL
BUN/Creatinine Ratio: 19 (ref 10–24)
BUN: 21 mg/dL (ref 8–27)
CO2: 23 mmol/L (ref 20–29)
Calcium: 8.9 mg/dL (ref 8.6–10.2)
Chloride: 103 mmol/L (ref 96–106)
Creatinine, Ser: 1.1 mg/dL (ref 0.76–1.27)
GFR calc Af Amer: 79 mL/min/{1.73_m2} (ref >59)
GFR calc non Af Amer: 68 mL/min/{1.73_m2} (ref >59)
Glucose: 173 mg/dL — ABNORMAL HIGH (ref 65–99)
Potassium: 4.2 mmol/L (ref 3.5–5.2)
Sodium: 141 mmol/L (ref 134–144)

## 2018-07-20 ENCOUNTER — Ambulatory Visit (INDEPENDENT_AMBULATORY_CARE_PROVIDER_SITE_OTHER): Payer: Medicare Other | Admitting: Cardiology

## 2018-07-20 ENCOUNTER — Encounter: Payer: Self-pay | Admitting: Cardiology

## 2018-07-20 VITALS — BP 122/68 | HR 79 | Ht 68.0 in | Wt 308.2 lb

## 2018-07-20 DIAGNOSIS — I447 Left bundle-branch block, unspecified: Secondary | ICD-10-CM | POA: Diagnosis not present

## 2018-07-20 DIAGNOSIS — I10 Essential (primary) hypertension: Secondary | ICD-10-CM | POA: Diagnosis not present

## 2018-07-20 DIAGNOSIS — I5032 Chronic diastolic (congestive) heart failure: Secondary | ICD-10-CM | POA: Diagnosis not present

## 2018-07-20 DIAGNOSIS — Z79899 Other long term (current) drug therapy: Secondary | ICD-10-CM

## 2018-07-20 LAB — BASIC METABOLIC PANEL
BUN/Creatinine Ratio: 19 (ref 10–24)
BUN: 25 mg/dL (ref 8–27)
CO2: 28 mmol/L (ref 20–29)
Calcium: 9 mg/dL (ref 8.6–10.2)
Chloride: 97 mmol/L (ref 96–106)
Creatinine, Ser: 1.29 mg/dL — ABNORMAL HIGH (ref 0.76–1.27)
GFR calc Af Amer: 65 mL/min/{1.73_m2} (ref >59)
GFR calc non Af Amer: 56 mL/min/{1.73_m2} — ABNORMAL LOW (ref >59)
Glucose: 203 mg/dL — ABNORMAL HIGH (ref 65–99)
Potassium: 4.5 mmol/L (ref 3.5–5.2)
Sodium: 142 mmol/L (ref 134–144)

## 2018-07-20 NOTE — Patient Instructions (Signed)
Medication Instructions:  The current medical regimen is effective;  continue present plan and medications.  Labwork: Please have blood work today (BMP)  Follow-Up: Follow up in 1 month with Cecilie Kicks, NP or Richardson Dopp, [place].  If you need a refill on your cardiac medications before your next appointment, please call your pharmacy.  Thank you for choosing [facility]!!
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
## 2018-08-22 ENCOUNTER — Ambulatory Visit: Payer: Medicare Other | Admitting: Cardiology

## 2018-10-02 ENCOUNTER — Other Ambulatory Visit: Payer: Self-pay | Admitting: Internal Medicine

## 2019-03-30 ENCOUNTER — Telehealth: Payer: Self-pay | Admitting: Cardiology

## 2019-03-30 NOTE — Telephone Encounter (Signed)
Spoke with patient who confirmed all demographics. Patient has a smart phone and tablet. He has My Chart. Will have vitals ready for visit.

## 2019-04-01 NOTE — Progress Notes (Deleted)
{Choose 1 Note Type (Telehealth Visit or Telephone Visit):[phone]}   Date:  04/01/2019   ID:  Keith Reading., DOB Apr 14, 1951, MRN [id number]  {Patient [phone]::"Home"} {Provider Location:[id number]::"Home"}  PCP:  Aura Dials, MD  Cardiologist:  Candee Furbish, MD  Electrophysiologist:  None   Evaluation Performed:  {Choose Visit Type:[phone]::"Follow-Up Visit"}  Chief Complaint: Follow-up for, chronic diastolic CHF, seen for Dr. Marlou Porch  History of Present Illness:    Keith Sheeler. is a 68 y.o. male with a history of hypertension, DM2 and chronic diastolic CHF.  Patient was initially referred to Dr. Marlou Porch from Dr. Aura Dials for ankle edema, abnormal EKG with LBBB and morbid obesity.  Echocardiogram performed showed an LVEF of 50% with left ventricular hypertrophy with hypokinesis of the anteroseptal, inferior, and inferoseptal myocardium with G2 DD.  At last office visit he noted severe lower extremity swelling which began 05/2018.  He started wearing compression stockings which helped.  He was started on Demadex 20 mg twice daily with great response.  He was started on Keflex for his cellulitis.  Last weight on 07/20/2018 308lb.  He was continued on his Demadex 20 mg twice daily.  Last creatinine was noted to be 1.1  1.  Essential hypertension: -Stable,  -Continue losartan 100mg  daily, Toprol 50mg   2.  Long-term use of high-risk medication:   3.  Chronic diastolic heart failure: -Continue Demadex 20 mg twice daily -On K+ supplementation  -Last creatinine, 1.29 with a baseline of 1.0-1.1 -Low-salt diet with fluid restriction of 1-1.5L per day  4.  Morbid obesity: -Encouraged weight loss including increasing daily activity to 30 minutes of aerobic acitivties such as walking 4-5 days per week -Limited by knee issues -Continue compression stockings   5.  Known LBBB: -Nuclear stress test without ischemia and low false LVEF -Follow-up  echocardiogram with LVEF noted  6. CKD stage II: -Creatinine, 1.29 on 07/20/2018 -Baseline appears to be in the 1.0-1.1 range    The patient {does/does not:200015} have symptoms concerning for COVID-19 infection (fever, chills, cough, or new shortness of breath).    Past Medical History:  Diagnosis Date  . Arthritis   . Back pain    occasionally  . BPH (benign prostatic hypertrophy)    takes Uroxatral daily as well as Finasteride   . Diabetes mellitus without complication ([place])    takes Metformin daily  . GERD (gastroesophageal reflux disease)    no meds  . Hepatitis hx of   at age 54  . Hypertension    takes Losartan and HCTZ daily  . Joint pain   . Urinary frequency   . Urinary urgency    Past Surgical History:  Procedure Laterality Date  . APPENDECTOMY     age 68  . COLONOSCOPY    . KNEE ARTHROSCOPY  1984   right and left   . SHOULDER ARTHROSCOPY  2003   right RCR-GSC-stayed overnight-had SOB  . SHOULDER ARTHROSCOPY WITH ROTATOR CUFF REPAIR AND SUBACROMIAL DECOMPRESSION Left 03/13/2013   Procedure: LEFT SHOULDER ARTHROSCOPY WITH SUBACROMIAL DECOMPRESSION, DISTAL CLAVICLE RESECTION AND REPAIR ROTATOR CUFF AND LABRAL DEBRIDEMENT;  Surgeon: [name]., MD;  Location: [facility];  Service: Orthopedics;  Laterality: Left;  . TOTAL HIP ARTHROPLASTY Right 01/29/2015   Procedure: TOTAL HIP ARTHROPLASTY ANTERIOR APPROACH;  Surgeon: [name], MD;  Location: [facility];  Service: Orthopedics;  Laterality: Right;  Marland Kitchen VASECTOMY    . [place]     No outpatient  medications have been marked as taking for the 04/03/19 encounter (Appointment) with Tommie Raymond, NP.     Allergies:   Codeine   Social History   Tobacco Use  . Smoking status: Never Smoker  . Smokeless tobacco: Former Network engineer Use Topics  . Alcohol use: No    Alcohol/week: 0.0 standard drinks    Comment: occasionally beer or scotch  . Drug use: No     Family  Hx: The patient's family history includes Benign prostatic hyperplasia in his father. There is no history of Kidney cancer or Kidney disease.  ROS:   Please see the history of present illness.    *** All other systems reviewed and are negative.   Prior CV studies:   The following studies were reviewed today:  Lexiscan stress test 06/14/2017:  The left ventricular ejection fraction is moderately decreased (30-44%).  Nuclear stress EF: 39%.  There was no ST segment deviation noted during stress.  Defect 1: There is a small defect of mild severity present in the mid anteroseptal and apical septal location.   Low risk stress nuclear study with LBBB-related septal perfusion artifact and moderately reduced left ventricular global systolic function.  Echocardiogram 05/25/2017: Study Conclusions  - Left ventricle: The cavity size was normal. There was moderate   concentric hypertrophy. Systolic function was mildly reduced. The   estimated ejection fraction was 50%. Hypokinesis of the   anteroseptal, inferior, and inferoseptal myocardium. Features are   consistent with a pseudonormal left ventricular filling pattern,   with concomitant abnormal relaxation and increased filling   pressure (grade 2 diastolic dysfunction). Doppler parameters are   consistent with indeterminate ventricular filling pressure. - Ventricular septum: Septal motion showed abnormal function and   dyssynchrony consistent with bundle branch block. - Aortic valve: Transvalvular velocity was within the normal range.   There was no stenosis. There was no regurgitation. - Mitral valve: Transvalvular velocity was within the normal range.   There was no evidence for stenosis. There was no regurgitation. - Left atrium: The atrium was moderately dilated. - Right ventricle: The cavity size was normal. Wall thickness was   normal. Systolic function was normal. - Tricuspid valve: There was no regurgitation.  Labs/Other  Tests and Data Reviewed:    EKG:  {EKG/Telemetry Strips [phone]}  Recent Labs: 06/15/2018: Magnesium 1.9 07/20/2018: BUN 25; Creatinine, Ser 1.29; Potassium 4.5; Sodium 142   Recent Lipid Panel No results found for: CHOL, TRIG, HDL, CHOLHDL, LDLCALC, LDLDIRECT  Wt Readings from Last 3 Encounters:  07/20/18 (!) 308 lb 3.2 oz (139.8 kg)  06/20/18 (!) 300 lb 12.8 oz (136.4 kg)  06/15/18 (!) 313 lb (142 kg)     Objective:    Vital Signs:  There were no vitals taken for this visit.   {HeartCare Virtual Exam (Optional):[phone]::"VITAL SIGNS:  reviewed"}  ASSESSMENT & PLAN:    1. ***  COVID-19 Education: The signs and symptoms of COVID-19 were discussed with the patient and how to seek care for testing (follow up with PCP or arrange E-visit).  ***The importance of social distancing was discussed today.  Time:   Today, I have spent *** minutes with the patient with telehealth technology discussing the above problems.     Medication Adjustments/Labs and Tests Ordered: Current medicines are reviewed at length with the patient today.  Concerns regarding medicines are outlined above.   Tests Ordered: No orders of the defined types were placed in this encounter.   Medication Changes: No orders  of the defined types were placed in this encounter.   Disposition:  Follow up {follow up:15908}  Signed, Kathyrn Drown, NP  04/01/2019 3:19 PM    [facility]

## 2019-04-03 ENCOUNTER — Ambulatory Visit: Payer: Medicare Other | Admitting: Cardiology

## 2019-04-03 ENCOUNTER — Telehealth: Payer: Medicare Other | Admitting: Cardiology

## 2019-04-03 ENCOUNTER — Encounter: Payer: Self-pay | Admitting: Cardiology

## 2019-04-03 ENCOUNTER — Other Ambulatory Visit: Payer: Self-pay

## 2019-04-03 VITALS — BP 138/78 | HR 86 | Ht 68.0 in | Wt 305.2 lb

## 2019-04-03 DIAGNOSIS — I5032 Chronic diastolic (congestive) heart failure: Secondary | ICD-10-CM | POA: Diagnosis not present

## 2019-04-03 DIAGNOSIS — S81809A Unspecified open wound, unspecified lower leg, initial encounter: Secondary | ICD-10-CM | POA: Diagnosis not present

## 2019-04-03 DIAGNOSIS — R600 Localized edema: Secondary | ICD-10-CM | POA: Diagnosis not present

## 2019-04-03 DIAGNOSIS — I447 Left bundle-branch block, unspecified: Secondary | ICD-10-CM

## 2019-04-03 NOTE — Patient Instructions (Addendum)
Medication Instructions:  The current medical regimen is effective;  continue present plan and medications.  If you need a refill on your cardiac medications before your next appointment, please call your pharmacy.   Lab work: NONE If you have labs (blood work) drawn today and your tests are completely normal, you will receive your results only by: Marland Kitchen MyChart Message (if you have MyChart) OR . A paper copy in the mail If you have any lab test that is abnormal or we need to change your treatment, we will call you to review the results.  Please decrease your fluid intake to 1L to 1.5 liters (1,500 ml) of fluid a day. Please measure this fluid out daily. Daily weights.  Keep feet/legs elevated during the day as much as possible.  You have been referred to Vascular and Vein for further evaluation and treatment of leg wounds.    Follow-Up: At [facility], you and your health needs are our priority.  As part of our continuing mission to provide you with exceptional heart care, we have created designated Provider Care Teams.  These Care Teams include your primary Cardiologist (physician) and Advanced Practice Providers (APPs -  Physician Assistants and Nurse Practitioners) who all work together to provide you with the care you need, when you need it. You will need a follow up appointment in 6 months Truitt Merle, NP and 1 yr with Dr Marlou Porch.  Please call our office 2 months in advance to schedule this appointment.  You may see Candee Furbish, MD or one of the following Advanced Practice Providers on your designated Care Team:   Truitt Merle, NP Cecilie Kicks, NP . Kathyrn Drown, NP  Thank you for choosing [facility]!!

## 2019-04-03 NOTE — Progress Notes (Signed)
Cardiology Office Note:    Date:  04/03/2019   ID:  [name]., DOB [birth year], MRN [id number]  PCP:  Keith Dials, MD  Cardiologist:  Keith Furbish, MD  Electrophysiologist:  None   Referring MD: Keith Dials, MD     History of Present Illness:    [name]. is a 68 y.o. male with diabetes with hypertension left bundle branch block morbid obesity here for follow-up.  He is retired Engineer, agricultural.  Wife had myocardial infarction 2017.  Prior severe swelling in his leg.  Keflex was given by [name]. Right leg numb.   [name], recent ABX with dressing.   He openly admits that he is drinking several liters of water a day.  He says he is always thirsty.  When he is thirsty he is drinking.  This lower extremity edema has been a chronic issue for him.  He asked if there is any type of surgery to fix this, unfortunately I do not think that that is an option.   Past Medical History:  Diagnosis Date  . Arthritis   . Back pain    occasionally  . BPH (benign prostatic hypertrophy)    takes Uroxatral daily as well as Finasteride   . Diabetes mellitus without complication ([place])    takes Metformin daily  . GERD (gastroesophageal reflux disease)    no meds  . Hepatitis hx of   at age 37  . Hypertension    takes Losartan and HCTZ daily  . Joint pain   . Urinary frequency   . Urinary urgency     Past Surgical History:  Procedure Laterality Date  . APPENDECTOMY     age 39  . COLONOSCOPY    . KNEE ARTHROSCOPY  1984   right and left   . SHOULDER ARTHROSCOPY  2003   right RCR-GSC-stayed overnight-had SOB  . SHOULDER ARTHROSCOPY WITH ROTATOR CUFF REPAIR AND SUBACROMIAL DECOMPRESSION Left 03/13/2013   Procedure: LEFT SHOULDER ARTHROSCOPY WITH SUBACROMIAL DECOMPRESSION, DISTAL CLAVICLE RESECTION AND REPAIR ROTATOR CUFF AND LABRAL DEBRIDEMENT;  Surgeon: [name]., MD;  [facility];  Service: Orthopedics;  Laterality: Left;  .  TOTAL HIP ARTHROPLASTY Right 01/29/2015   Procedure: TOTAL HIP ARTHROPLASTY ANTERIOR APPROACH;  Surgeon: [name], MD;  Location: [facility];  Service: Orthopedics;  Laterality: Right;  Marland Kitchen VASECTOMY    . VASECTOMY REVERSAL  1993    Current Medications: Current Meds  Medication Sig  . Cholecalciferol (VITAMIN D3) 5000 UNITS TABS Take 1 tablet by mouth daily. Reported on 04/29/2016  . finasteride (PROSCAR) 5 MG tablet Take 5 mg by mouth daily.  Marland Kitchen glipiZIDE (GLUCOTROL XL) 5 MG 24 hr tablet Take 5 mg by mouth 3 (three) times daily with meals.  . metoprolol succinate (TOPROL-XL) 50 MG 24 hr tablet TAKE 1 TABLET BY MOUTH  DAILY WITH OR IMMEDIATLEY  FOLLOWING A MEAL  . MILK THISTLE PO Take 1 capsule by mouth daily.   . potassium chloride SA (KLOR-CON M20) 20 MEQ tablet Take 1 tablet (20 mEq total) by mouth daily.  Marland Kitchen torsemide (DEMADEX) 20 MG tablet Take 1 tablet (20 mg total) by mouth 2 (two) times daily.     Allergies:   Codeine   Social History   Socioeconomic History  . Marital status: Married    Spouse name: Not on file  . Number of children: Not on file  . Years of education: Not on file  . Highest education  level: Not on file  Occupational History  . Not on file  Social Needs  . Financial resource strain: Not on file  . Food insecurity:    Worry: Not on file    Inability: Not on file  . Transportation needs:    Medical: Not on file    Non-medical: Not on file  Tobacco Use  . Smoking status: Never Smoker  . Smokeless tobacco: Former Network engineer and Sexual Activity  . Alcohol use: No    Alcohol/week: 0.0 standard drinks    Comment: occasionally beer or scotch  . Drug use: No  . Sexual activity: Yes  Lifestyle  . Physical activity:    Days per week: Not on file    Minutes per session: Not on file  . Stress: Not on file  Relationships  . Social connections:    Talks on phone: Not on file    Gets together: Not on file    Attends religious service: Not on file     Active member of club or organization: Not on file    Attends meetings of clubs or organizations: Not on file    Relationship status: Not on file  Other Topics Concern  . Not on file  Social History Narrative  . Not on file     Family History: The patient's family history includes Benign prostatic hyperplasia in his father. There is no history of Kidney cancer or Kidney disease.  ROS:   Please see the history of present illness.    Fevers chills nausea vomiting syncope all other systems reviewed and are negative.  EKGs/Labs/Other Studies Reviewed:    The following studies were reviewed today: Prior office notes, echocardiogram  EKG:  EKG is not ordered today.    Recent Labs: 06/15/2018: Magnesium 1.9 07/20/2018: BUN 25; Creatinine, Ser 1.29; Potassium 4.5; Sodium 142  Recent Lipid Panel No results found for: CHOL, TRIG, HDL, CHOLHDL, VLDL, LDLCALC, LDLDIRECT  Physical Exam:    VS:  BP 138/78   Pulse 86   Ht 5\' 8"  (1.727 m)   Wt (!) 305 lb 3.2 oz (138.4 kg)   SpO2 96%   BMI 46.41 kg/m     Wt Readings from Last 3 Encounters:  04/03/19 (!) 305 lb 3.2 oz (138.4 kg)  07/20/18 (!) 308 lb 3.2 oz (139.8 kg)  06/20/18 (!) 300 lb 12.8 oz (136.4 kg)     GEN:  Well nourished, well developed in no acute distress HEENT: Normal NECK: No JVD; No carotid bruits LYMPHATICS: No lymphadenopathy CARDIAC: Grossly edematous lower extremities, scabs noted.  Weeping on floor. RESPIRATORY: Normal respiratory effort ABDOMEN: Obese MUSCULOSKELETAL:   No deformity  SKIN: Scabbing noted NEUROLOGIC:  Alert and oriented x 3 PSYCHIATRIC:  Normal affect   ASSESSMENT:    1. Open wound of lower extremity, unspecified laterality, initial encounter   2. Chronic diastolic heart failure ([place])   3. Morbid obesity ([place])   4. Lower extremity edema   5. Left bundle branch block    PLAN:    In order of problems listed above:  Chronic diastolic heart failure/ lower extremity edema -Grade 2  diastolic dysfunction on prior echocardiogram with EF 50%.  No salt.  Fluid restriction is noted.  He clearly has not been adhering to fluid restriction.  He states when he is thirsty he is drinking.  I asked him to limit his fluid to 1 to 1-1/2 L/day.  He needs to measure this out every day and strictly adhere  to this.  Ultimately, a 100 pound weight loss is also needed to dramatically help him with his lower extremity edema.  This is been a chronic issue for him and is continuing to worsen.  Explained to him that he is still dramatically fluid overloaded from an extravascular perspective.  I will also have him see vascular team to see if there are any thoughts or options for him.  Unfortunately, I explained to him that there is no quick fix for this.  He has to try to adhere to fluid restrictions and continue to take his Demadex. -Appreciate help from [name] and his team who is been gracious enough to assist him through this ordeal.  Recently completed antibiotics.  Left bundle branch block -Chronic.  Prior nuclear stress test showed no ischemia, falsely low EF.  Morbid obesity -Continue to encourage weight loss.  Hampered by hip replacement right.  Lower extremity edema -TED hose when able, torsemide.  See above for full details.  Ongoing issue.  Diabetes with hypertension -On glipizide.  Managed by [name].   Medication Adjustments/Labs and Tests Ordered: Current medicines are reviewed at length with the patient today.  Concerns regarding medicines are outlined above.  Orders Placed This Encounter  Procedures  . Ambulatory referral to Vascular Surgery   No orders of the defined types were placed in this encounter.   Patient Instructions  Medication Instructions:  The current medical regimen is effective;  continue present plan and medications.  If you need a refill on your cardiac medications before your next appointment, please call your pharmacy.   Lab work: NONE If  you have labs (blood work) drawn today and your tests are completely normal, you will receive your results only by: Marland Kitchen MyChart Message (if you have MyChart) OR . A paper copy in the mail If you have any lab test that is abnormal or we need to change your treatment, we will call you to review the results.  Please decrease your fluid intake to 1L to 1.5 liters (1,500 ml) of fluid a day. Please measure this fluid out daily. Daily weights.  Keep feet/legs elevated during the day as much as possible.  You have been referred to Vascular and Vein for further evaluation and treatment of leg wounds.    Follow-Up: At [facility], you and your health needs are our priority.  As part of our continuing mission to provide you with exceptional heart care, we have created designated Provider Care Teams.  These Care Teams include your primary Cardiologist (physician) and Advanced Practice Providers (APPs -  Physician Assistants and Nurse Practitioners) who all work together to provide you with the care you need, when you need it. You will need a follow up appointment in 6 months Truitt Merle, NP and 1 yr with Dr Marlou Porch.  Please call our office 2 months in advance to schedule this appointment.  You may see Keith Furbish, MD or one of the following Advanced Practice Providers on your designated Care Team:   Truitt Merle, NP Cecilie Kicks, NP . Kathyrn Drown, NP  Thank you for choosing [facility]!!          Signed, Keith Furbish, MD  04/03/2019 10:53 AM    [place]

## 2019-06-20 ENCOUNTER — Telehealth: Payer: Self-pay | Admitting: Cardiology

## 2019-06-20 NOTE — Telephone Encounter (Signed)
New Message   Christy from [facility] is calling in reference to the the referral that was sent to them from Dr. Marlou Porch for the patient having an open wound. Alyse Low states that she has called the patient 4 times and has not been able to get in touch with the patient. She is calling to see if she should just close out the referral. Please call back to advise.

## 2019-06-20 NOTE — Telephone Encounter (Signed)
I attempted to contact patient by phone.  NA and the mailbox was full therefore no message could be left.    Spoke with Alyse Low who states she will close out the referral for now.  If the pt calls in we will get him scheduled.

## 2019-06-21 ENCOUNTER — Encounter: Payer: Self-pay | Admitting: Cardiology

## 2019-06-21 ENCOUNTER — Ambulatory Visit: Payer: Medicare Other | Admitting: Cardiology

## 2019-06-21 ENCOUNTER — Other Ambulatory Visit: Payer: Self-pay

## 2019-06-21 VITALS — BP 159/82 | HR 96 | Ht 69.0 in | Wt 301.0 lb

## 2019-06-21 DIAGNOSIS — R600 Localized edema: Secondary | ICD-10-CM | POA: Diagnosis not present

## 2019-06-21 DIAGNOSIS — Z6841 Body Mass Index (BMI) 40.0 and over, adult: Secondary | ICD-10-CM

## 2019-06-21 DIAGNOSIS — I447 Left bundle-branch block, unspecified: Secondary | ICD-10-CM

## 2019-06-21 DIAGNOSIS — I5032 Chronic diastolic (congestive) heart failure: Secondary | ICD-10-CM

## 2019-06-21 DIAGNOSIS — I10 Essential (primary) hypertension: Secondary | ICD-10-CM | POA: Diagnosis not present

## 2019-06-21 NOTE — Progress Notes (Signed)
Primary Physician/Referring:  Aura Dials, MD  Patient ID: [name]., male    DOB: Mar 24, 1951, 68 y.o.   MRN: [id number]  Chief Complaint  Patient presents with  . Congestive Heart Failure  . New Patient (Initial Visit)   HPI:    HPI: Khani Paino.  is a 68 y.o. Caucasian male with hypertension, LBBB, uncontrolled diabetes mellitus, morbid obesity, chronic leg edema probably due to venous insufficiency from morbid obesity with recent ulceration left leg and now under [facility] management and healing, was previously seen by Dr. Candee Furbish at [facility] is now referred to me for evaluation and management of chronic diastolic heart failure.  He is presently having chronic dyspnea that he states that is stable over many years, he is under wound care by home health.  He states that the left foot ulceration is almost healed.  He is wearing support stockings regularly.  He denies chest pain, denies PND or orthopnea although he sleeps in a recliner.  Past Medical History:  Diagnosis Date  . Arthritis   . Back pain    occasionally  . BPH (benign prostatic hypertrophy)    takes Uroxatral daily as well as Finasteride   . Diabetes mellitus without complication ([place])    takes Metformin daily  . GERD (gastroesophageal reflux disease)    no meds  . Hepatitis hx of   at age 44  . Hypertension    takes Losartan and HCTZ daily  . Joint pain   . Urinary frequency   . Urinary urgency    Past Surgical History:  Procedure Laterality Date  . APPENDECTOMY     age 16  . COLONOSCOPY    . KNEE ARTHROSCOPY  1984   right and left   . SHOULDER ARTHROSCOPY  2003   right RCR-GSC-stayed overnight-had SOB  . SHOULDER ARTHROSCOPY WITH ROTATOR CUFF REPAIR AND SUBACROMIAL DECOMPRESSION Left 03/13/2013   Procedure: LEFT SHOULDER ARTHROSCOPY WITH SUBACROMIAL DECOMPRESSION, DISTAL CLAVICLE RESECTION AND REPAIR ROTATOR CUFF AND LABRAL DEBRIDEMENT;  Surgeon: [name]., MD;   Location: [place];  Service: Orthopedics;  Laterality: Left;  . TOTAL HIP ARTHROPLASTY Right 01/29/2015   Procedure: TOTAL HIP ARTHROPLASTY ANTERIOR APPROACH;  Surgeon: [name], MD;  Location: [place];  Service: Orthopedics;  Laterality: Right;  Marland Kitchen VASECTOMY    . [place]   Social History   Socioeconomic History  . Marital status: Married    Spouse name: Not on file  . Number of children: 3  . Years of education: Not on file  . Highest education level: Not on file  Occupational History  . Not on file  Social Needs  . Financial resource strain: Not on file  . Food insecurity    Worry: Not on file    Inability: Not on file  . Transportation needs    Medical: Not on file    Non-medical: Not on file  Tobacco Use  . Smoking status: Never Smoker  . Smokeless tobacco: Former Network engineer and Sexual Activity  . Alcohol use: Yes    Alcohol/week: 0.0 standard drinks    Comment: occasionally beer or scotch  . Drug use: No  . Sexual activity: Yes  Lifestyle  . Physical activity    Days per week: Not on file    Minutes per session: Not on file  . Stress: Not on file  Relationships  . Social Herbalist on  phone: Not on file    Gets together: Not on file    Attends religious service: Not on file    Active member of club or organization: Not on file    Attends meetings of clubs or organizations: Not on file    Relationship status: Not on file  . Intimate partner violence    Fear of current or ex partner: Not on file    Emotionally abused: Not on file    Physically abused: Not on file    Forced sexual activity: Not on file  Other Topics Concern  . Not on file  Social History Narrative  . Not on file   ROS  Review of Systems  Constitution: Negative for chills, decreased appetite, malaise/fatigue, weight gain and weight loss.  Cardiovascular: Positive for dyspnea on exertion (chronic and stable) and leg swelling. Negative for  orthopnea (sleeps in a recliner), paroxysmal nocturnal dyspnea and syncope.  Endocrine: Negative for cold intolerance.  Hematologic/Lymphatic: Does not bruise/bleed easily.  Musculoskeletal: Positive for arthritis (hands) and joint pain (right hip at total arthroplasty site). Negative for joint swelling.  Gastrointestinal: Positive for diarrhea (chronic). Negative for abdominal pain, anorexia, change in bowel habit, hematochezia and melena.  Neurological: Negative for headaches and light-headedness.  Psychiatric/Behavioral: Negative for depression and substance abuse.  All other systems reviewed and are negative.  Objective  Blood pressure (!) 159/82, pulse 96, height _0  (1.753 m), weight (!) 301 lb (136.5 kg), SpO2 97 %. Body mass index is 44.45 kg/m.   Physical Exam  Constitutional: He appears well-developed. No distress.  Morbidly obese  HENT:  Head: Atraumatic.  Eyes: Conjunctivae are normal.  Neck: Neck supple. No thyromegaly present.  Short neck and difficult to evaluate JVP  Cardiovascular: Normal rate, regular rhythm and normal heart sounds. Exam reveals no gallop.  No murmur heard. Pulses:      Carotid pulses are 2+ on the right side and 2+ on the left side.      Dorsalis pedis pulses are 2+ on the right side and 2+ on the left side.       Posterior tibial pulses are 2+ on the right side and 2+ on the left side.  Femoral pulse difficult to feel due to patient's body habitus. Bounding popliteal pulse bilateral.  Right pedal pulse normal in spite of edema. Left cannot be felt due to dressing from wound. Cannot make out JVD due to short neck.   Pulmonary/Chest: Effort normal and breath sounds normal.  Abdominal: Soft. Bowel sounds are normal.  Obese. Pannus present  Musculoskeletal: Normal range of motion.  Neurological: He is alert.  Skin: Skin is warm and dry.  Psychiatric: He has a normal mood and affect.   Radiology: No results found.  Laboratory examination:     Labs 05/23/2019: Serum glucose 180 mg, BUN 11, creatinine 0.95, EGFR greater than 60 mL, potassium 4.0.  A1c 9.8%.  CMP Latest Ref Rng & Units 07/20/2018 06/26/2018 06/20/2018  Glucose 65 - 99 mg/dL 203(H) 173(H) 190(H)  BUN 8 - 27 mg/dL 25 21 44(H)  Creatinine 0.76 - 1.27 mg/dL 1.29(H) 1.10 1.65(H)  Sodium 134 - 144 mmol/L 142 141 138  Potassium 3.5 - 5.2 mmol/L 4.5 4.2 4.4  Chloride 96 - 106 mmol/L 97 103 95(L)  CO2 20 - 29 mmol/L _1 Calcium 8.6 - 10.2 mg/dL 9.0 8.9 8.1(L)  Total Protein 6.0 - 8.3 g/dL - - -  Total Bilirubin 0.3 - 1.2 mg/dL - - -  Alkaline Phos 39 - 117 U/L - - -  AST 0 - 37 U/L - - -  ALT 0 - 53 U/L - - -   CBC Latest Ref Rng & Units 01/31/2015 01/30/2015 01/29/2015  WBC 4.0 - 10.5 K/uL 10.6(H) 9.9 19.3(H)  Hemoglobin 13.0 - 17.0 g/dL 10.5(L) 11.6(L) 12.9(L)  Hematocrit 39.0 - 52.0 % 31.1(L) 34.3(L) 37.7(L)  Platelets 150 - 400 K/uL 161 204 211   Lipid Panel  No results found for: CHOL, TRIG, HDL, CHOLHDL, VLDL, LDLCALC, LDLDIRECT HEMOGLOBIN A1C No results found for: HGBA1C, MPG TSH No results for input(s): TSH in the last 8760 hours. Medications   Current Outpatient Medications  Medication Instructions  . Cholecalciferol (VITAMIN D3) 5000 UNITS TABS 1 tablet, Oral, Daily, Reported on 04/29/2016  . finasteride (PROSCAR) 5 mg, Oral, Daily  . glipiZIDE (GLUCOTROL XL) 5 mg, Oral, 3 times daily with meals  . metoprolol succinate (TOPROL-XL) 50 MG 24 hr tablet TAKE 1 TABLET BY MOUTH  DAILY WITH OR IMMEDIATLEY  FOLLOWING A MEAL  . MILK THISTLE PO 1 capsule, Oral, Occasionally  . potassium chloride SA (KLOR-CON M20) 20 MEQ tablet 20 mEq, Oral, Daily  . torsemide (DEMADEX) 20 mg, Oral, 2 times daily    Cardiac Studies:   Lexiscan Sestamibi Stress Test 06/13/2017 at [facility]:  The left ventricular ejection fraction is moderately decreased (30-44%).  Nuclear stress EF: 39%.  There was no ST segment deviation noted during stress.  Defect 1: There is a small  defect of mild severity present in the mid anteroseptal and apical septal location.   Low risk stress nuclear study with LBBB-related septal perfusion artifact and moderately reduced left ventricular global systolic function.  Echocardiogram 05/25/2017 :   Left ventricle: The cavity size was normal. There was moderate   concentric hypertrophy. Systolic function was mildly reduced. The   estimated ejection fraction was 50%. Hypokinesis of the   anteroseptal, inferior, and inferoseptal myocardium. Features are   consistent with a pseudonormal left ventricular filling pattern,   with concomitant abnormal relaxation and increased filling   pressure (grade 2 diastolic dysfunction). Doppler parameters are   consistent with indeterminate ventricular filling pressure. - Ventricular septum: Septal motion showed abnormal function and   dyssynchrony consistent with bundle branch block.  - Left atrium: The atrium was moderately dilated. - Right ventricle: The cavity size was normal. Wall thickness was   normal. Systolic function was normal.  Assessment     ICD-10-CM   1. Chronic diastolic heart failure (HCC)  I50.32 EKG 12-Lead  2. Lower extremity edema  R60.0   3. Essential hypertension  I10   4. Left bundle branch block  I44.7   5. Class 3 severe obesity due to excess calories with serious comorbidity and body mass index (BMI) of 40.0 to 44.9 in adult [facility])  E66.01    Z68.41     EKG 06/21/2019: Normal sinus rhythm at rate of 95 bpm, left atrial abnormality, left bundle branch block.  No further analysis.  Recommendations:   Patient referred to me to reestablish with 2nd cardiologist, patient with chronic diastolic heart failure, morbid obesity, uncontrolled diabetes mellitus and hypertension.  I reviewed all his medical records available to me and I suspect that from cardiac standpoint he has been stable with chronic diastolic heart failure.  Morbid obesity is contributing majorly to his leg edema and  he probably has chronic cor pulmonale versus chronic venous insufficiency or chronic leg edema due to history to lung disease  for morbid obesity.  He is also drinking excessive amounts of fluids and eating salty food although he takes Lasix on a daily basis and has good response to it.  He does have chronic left bundle branch block and low normal LVEF probably related to underlying LBBB.  His stress test and echocardiogram was about 2-3 years ago, I may consider repeating an echocardiogram.  But initially I would like to stress to him regarding making lifestyle changes.  I advised him bedrest for the next 10 days to 2 weeks size edema can improve which will also allow for healing of his left leg ulcer related to his edema.  He'll continue with Lasix twice a day which she has been taking only once a day.  Otherwise I will not make any changes for now.  I'd like to see him back in 3-4 weeks.  I discussed regarding high salt diet, advised him not to eat any more pickles, drink suits made outside and also to order out food from outside.  Patient appears to be motivated.  I may consider changing his therapies once a establish compliance with the patient.  This was a 60 minute office visit encounter with greater than 50% of the time spent face-to-face.  Adrian Prows, MD, [facility] 06/21/2019, 2:02 PM [facility]. [place] Pager: [phone] Office: [phone] If no answer Cell [phone]

## 2019-07-05 ENCOUNTER — Other Ambulatory Visit: Payer: Self-pay

## 2019-07-05 MED ORDER — TORSEMIDE 20 MG PO TABS: 40.0000 mg | ORAL_TABLET | Freq: Every day | ORAL | 3 refills | Status: DC

## 2019-07-20 ENCOUNTER — Ambulatory Visit: Payer: Medicare Other | Admitting: Cardiology

## 2019-07-30 ENCOUNTER — Ambulatory Visit: Payer: Medicare Other | Admitting: Cardiology

## 2019-07-30 ENCOUNTER — Encounter: Payer: Self-pay | Admitting: Cardiology

## 2019-07-30 ENCOUNTER — Other Ambulatory Visit: Payer: Self-pay

## 2019-07-30 VITALS — BP 138/73 | HR 85 | Ht 69.0 in | Wt 297.0 lb

## 2019-07-30 DIAGNOSIS — R600 Localized edema: Secondary | ICD-10-CM | POA: Diagnosis not present

## 2019-07-30 DIAGNOSIS — S81809A Unspecified open wound, unspecified lower leg, initial encounter: Secondary | ICD-10-CM

## 2019-07-30 DIAGNOSIS — I5032 Chronic diastolic (congestive) heart failure: Secondary | ICD-10-CM | POA: Diagnosis not present

## 2019-07-30 DIAGNOSIS — Z6841 Body Mass Index (BMI) 40.0 and over, adult: Secondary | ICD-10-CM

## 2019-07-30 NOTE — Progress Notes (Signed)
Primary Physician/Referring:  Aura Dials, MD  Patient ID: [name]., male    DOB: 09-Dec-1950, 68 y.o.   MRN: [id number]  Chief Complaint  Patient presents with  . Diastolic Heart Failure   HPI:    HPI: [name].  is a 68 y.o. Caucasian male with hypertension, LBBB, uncontrolled diabetes mellitus, morbid obesity, chronic leg edema probably due to venous insufficiency from morbid obesity with recent ulceration left leg and now under [facility] management and healing, was previously seen by Dr. Candee Furbish at [facility] is now recently established with [place] for diastolic heart failure.  At his last visit, he was counseled on diet modifications to help his diastolic heart failure and leg swelling. He continues to have dressing over his left leg, but states that the wound has essentially resolved and he hopes to be able to stop wearing the wrap after evaluation by home health care nurse tomorrow. Dyspnea is unchanged from before. He has made some changes to his diet, but does continue to eat occasional fried chicken or fish and drink the occasional soda. He is wearing support stockings regularly.  He denies chest pain, denies PND or orthopnea although he sleeps in a recliner.  Past Medical History:  Diagnosis Date  . Arthritis   . Back pain    occasionally  . BPH (benign prostatic hypertrophy)    takes Uroxatral daily as well as Finasteride   . Diabetes mellitus without complication ([place])    takes Metformin daily  . GERD (gastroesophageal reflux disease)    no meds  . Hepatitis hx of   at age 73  . Hypertension    takes Losartan and HCTZ daily  . Joint pain   . Urinary frequency   . Urinary urgency    Past Surgical History:  Procedure Laterality Date  . APPENDECTOMY     age 14  . COLONOSCOPY    . KNEE ARTHROSCOPY  1984   right and left   . SHOULDER ARTHROSCOPY  2003   right RCR-GSC-stayed overnight-had SOB  . SHOULDER ARTHROSCOPY WITH ROTATOR CUFF REPAIR  AND SUBACROMIAL DECOMPRESSION Left 03/13/2013   Procedure: LEFT SHOULDER ARTHROSCOPY WITH SUBACROMIAL DECOMPRESSION, DISTAL CLAVICLE RESECTION AND REPAIR ROTATOR CUFF AND LABRAL DEBRIDEMENT;  Surgeon: [name]., MD;  Location: [place];  Service: Orthopedics;  Laterality: Left;  . TOTAL HIP ARTHROPLASTY Right 01/29/2015   Procedure: TOTAL HIP ARTHROPLASTY ANTERIOR APPROACH;  Surgeon: [name], MD;  Location: [place];  Service: Orthopedics;  Laterality: Right;  Marland Kitchen VASECTOMY    . [place]   Social History   Socioeconomic History  . Marital status: Married    Spouse name: Not on file  . Number of children: 3  . Years of education: Not on file  . Highest education level: Not on file  Occupational History  . Not on file  Social Needs  . Financial resource strain: Not on file  . Food insecurity    Worry: Not on file    Inability: Not on file  . Transportation needs    Medical: Not on file    Non-medical: Not on file  Tobacco Use  . Smoking status: Never Smoker  . Smokeless tobacco: Former Network engineer and Sexual Activity  . Alcohol use: Yes    Alcohol/week: 0.0 standard drinks    Comment: occasionally beer or scotch  . Drug use: No  . Sexual activity: Yes  Lifestyle  .  Physical activity    Days per week: Not on file    Minutes per session: Not on file  . Stress: Not on file  Relationships  . Social Herbalist on phone: Not on file    Gets together: Not on file    Attends religious service: Not on file    Active member of club or organization: Not on file    Attends meetings of clubs or organizations: Not on file    Relationship status: Not on file  . Intimate partner violence    Fear of current or ex partner: Not on file    Emotionally abused: Not on file    Physically abused: Not on file    Forced sexual activity: Not on file  Other Topics Concern  . Not on file  Social History Narrative  . Not on file   ROS   Review of Systems  Constitution: Negative for chills, decreased appetite, malaise/fatigue, weight gain and weight loss.  Cardiovascular: Positive for dyspnea on exertion (chronic and stable) and leg swelling. Negative for orthopnea (sleeps in a recliner), paroxysmal nocturnal dyspnea and syncope.  Endocrine: Negative for cold intolerance.  Hematologic/Lymphatic: Does not bruise/bleed easily.  Musculoskeletal: Positive for arthritis (hands) and joint pain (right hip at total arthroplasty site). Negative for joint swelling.  Gastrointestinal: Positive for diarrhea (chronic). Negative for abdominal pain, anorexia, change in bowel habit, hematochezia and melena.  Neurological: Negative for headaches and light-headedness.  Psychiatric/Behavioral: Negative for depression and substance abuse.  All other systems reviewed and are negative.  Objective  Blood pressure 138/73, pulse 85, height '5\' 9"'$  (1.753 m), weight 297 lb (134.7 kg), SpO2 96 %. Body mass index is 43.86 kg/m.   Physical Exam  Constitutional: He appears well-developed. No distress.  Morbidly obese  HENT:  Head: Atraumatic.  Eyes: Conjunctivae are normal.  Neck: Neck supple. No thyromegaly present.  Short neck and difficult to evaluate JVP  Cardiovascular: Normal rate, regular rhythm and normal heart sounds. Exam reveals no gallop.  No murmur heard. Pulses:      Carotid pulses are 2+ on the right side and 2+ on the left side.      Dorsalis pedis pulses are 2+ on the right side and 2+ on the left side.       Posterior tibial pulses are 2+ on the right side and 2+ on the left side.  Femoral pulse difficult to feel due to patient's body habitus. Bounding popliteal pulse bilateral.  Right pedal pulse normal in spite of edema. Left cannot be felt due to dressing from wound. Cannot make out JVD due to short neck.   Pulmonary/Chest: Effort normal and breath sounds normal.  Abdominal: Soft. Bowel sounds are normal.  Obese. Pannus  present  Musculoskeletal: Normal range of motion.  Neurological: He is alert.  Skin: Skin is warm and dry.  Psychiatric: He has a normal mood and affect.   Radiology: No results found.  Laboratory examination:    Labs 05/23/2019: Serum glucose 180 mg, BUN 11, creatinine 0.95, EGFR greater than 60 mL, potassium 4.0.  A1c 9.8%.  CMP Latest Ref Rng & Units 07/20/2018 06/26/2018 06/20/2018  Glucose 65 - 99 mg/dL 203(H) 173(H) 190(H)  BUN 8 - 27 mg/dL 25 21 44(H)  Creatinine 0.76 - 1.27 mg/dL 1.29(H) 1.10 1.65(H)  Sodium 134 - 144 mmol/L 142 141 138  Potassium 3.5 - 5.2 mmol/L 4.5 4.2 4.4  Chloride 96 - 106 mmol/L 97 103 95(L)  CO2  20 - 29 mmol/L _0 Calcium 8.6 - 10.2 mg/dL 9.0 8.9 8.1(L)  Total Protein 6.0 - 8.3 g/dL - - -  Total Bilirubin 0.3 - 1.2 mg/dL - - -  Alkaline Phos 39 - 117 U/L - - -  AST 0 - 37 U/L - - -  ALT 0 - 53 U/L - - -   CBC Latest Ref Rng & Units 01/31/2015 01/30/2015 01/29/2015  WBC 4.0 - 10.5 K/uL 10.6(H) 9.9 19.3(H)  Hemoglobin 13.0 - 17.0 g/dL 10.5(L) 11.6(L) 12.9(L)  Hematocrit 39.0 - 52.0 % 31.1(L) 34.3(L) 37.7(L)  Platelets 150 - 400 K/uL 161 204 211   Lipid Panel  No results found for: CHOL, TRIG, HDL, CHOLHDL, VLDL, LDLCALC, LDLDIRECT HEMOGLOBIN A1C No results found for: HGBA1C, MPG TSH No results for input(s): TSH in the last 8760 hours. Medications   Current Outpatient Medications  Medication Instructions  . Cholecalciferol (VITAMIN D3) 5000 UNITS TABS 1 tablet, Oral, Daily, Reported on 04/29/2016  . finasteride (PROSCAR) 5 mg, Oral, Daily  . glipiZIDE (GLUCOTROL XL) 5 mg, Oral, 3 times daily with meals  . metoprolol succinate (TOPROL-XL) 50 MG 24 hr tablet TAKE 1 TABLET BY MOUTH  DAILY WITH OR IMMEDIATLEY  FOLLOWING A MEAL  . MILK THISTLE PO 1 capsule, Oral, Occasionally  . potassium chloride SA (KLOR-CON M20) 20 MEQ tablet 20 mEq, Oral, Daily  . torsemide (DEMADEX) 40 mg, Oral, Daily    Cardiac Studies:   Lexiscan Sestamibi Stress Test  06/13/2017 at [facility]:  The left ventricular ejection fraction is moderately decreased (30-44%).  Nuclear stress EF: 39%.  There was no ST segment deviation noted during stress.  Defect 1: There is a small defect of mild severity present in the mid anteroseptal and apical septal location.   Low risk stress nuclear study with LBBB-related septal perfusion artifact and moderately reduced left ventricular global systolic function.  Echocardiogram 05/25/2017 :   Left ventricle: The cavity size was normal. There was moderate   concentric hypertrophy. Systolic function was mildly reduced. The   estimated ejection fraction was 50%. Hypokinesis of the   anteroseptal, inferior, and inferoseptal myocardium. Features are   consistent with a pseudonormal left ventricular filling pattern,   with concomitant abnormal relaxation and increased filling   pressure (grade 2 diastolic dysfunction). Doppler parameters are   consistent with indeterminate ventricular filling pressure. - Ventricular septum: Septal motion showed abnormal function and   dyssynchrony consistent with bundle branch block.  - Left atrium: The atrium was moderately dilated. - Right ventricle: The cavity size was normal. Wall thickness was   normal. Systolic function was normal.  Assessment     ICD-10-CM   1. Chronic diastolic heart failure (HCC)  I50.32 PCV ECHOCARDIOGRAM COMPLETE  2. Lower extremity edema  R60.0   3. Open wound of lower extremity, unspecified laterality, initial encounter  S81.809A   4. Class 3 severe obesity due to excess calories with serious comorbidity and body mass index (BMI) of 40.0 to 44.9 in adult [facility])  E66.01    Z68.41     EKG 06/21/2019: Normal sinus rhythm at rate of 95 bpm, left atrial abnormality, left bundle branch block.  No further analysis.  Recommendations:   Patient has made some lifestyle changes and has noticed some slight improvement in leg edema. Overall his dyspnea has been stable. Continue to  feel that his leg edema is related to his obesity and possibly venous insufficiency. Although he has made some changes to his  diet, he was counseled on further changes that he could make and healthier food options to choose. Advised on salty foods, including foods with hidden salt, and encouraged to avoid these. He has lost a few pounds since his last visit, hopefully, with continued diet changes, this will continue to improve. I have also encouraged him to start using his stationary bicycle for exercise.  I will repeat his echocardiogram for evaluation of his leg edema. He reports that left leg ulceration has almost completely healed and is closely being followed and managed by Home Health. No changes were made in medications today. Will see him back after his echocardiogram for follow up and further recommendations.   Miquel Dunn, MD, [facility] 08/02/2019, 8:30 PM [facility]. [place] Pager: [phone] Office: [phone] If no answer Cell [phone]

## 2019-08-08 ENCOUNTER — Other Ambulatory Visit: Payer: Medicare Other

## 2019-08-13 ENCOUNTER — Telehealth: Payer: Self-pay

## 2019-08-13 NOTE — Telephone Encounter (Signed)
[name] from [facility] LVM stating she detected a significant amount of PAC's/PVC's when listening to [name]'s heart today. Also, he is still having a considerable amount of drainage from legs. Please advise

## 2019-08-15 ENCOUNTER — Ambulatory Visit (INDEPENDENT_AMBULATORY_CARE_PROVIDER_SITE_OTHER): Payer: Medicare Other

## 2019-08-15 ENCOUNTER — Other Ambulatory Visit: Payer: Self-pay

## 2019-08-15 DIAGNOSIS — I5032 Chronic diastolic (congestive) heart failure: Secondary | ICD-10-CM

## 2019-08-15 NOTE — Telephone Encounter (Signed)
He needs to make some changes to his diet to help with his leg swelling. If he is asymptomatic from PAC/PVC, will just continue to monitor. We can move up his appt to sometime sooner

## 2019-08-15 NOTE — Telephone Encounter (Signed)
Spoke with patient gave [name] recommendations. Patient does not feel PAC's/PVC's. Patient verbalized understanding.

## 2019-09-10 ENCOUNTER — Ambulatory Visit: Payer: Medicare Other | Admitting: Cardiology

## 2019-09-10 NOTE — Progress Notes (Deleted)
Primary Physician/Referring:  Aura Dials, MD  Patient ID: Keith Reading., male    DOB: 1951-05-30, 68 y.o.   MRN: [id number]  No chief complaint on file.  HPI:    HPI: [name].  is a 68 y.o. Caucasian male with hypertension, LBBB, uncontrolled diabetes mellitus, morbid obesity, chronic leg edema probably due to venous insufficiency from morbid obesity with recent ulceration left leg and now under [facility] management and healing, was previously seen by Dr. Candee Furbish at [facility] is now recently established with [place] for diastolic heart failure.  At his last visit, he was counseled on diet modifications to help his diastolic heart failure and leg swelling. He continues to have dressing over his left leg, but states that the wound has essentially resolved and he hopes to be able to stop wearing the wrap after evaluation by home health care nurse tomorrow. Dyspnea is unchanged from before. He has made some changes to his diet, but does continue to eat occasional fried chicken or fish and drink the occasional soda. He is wearing support stockings regularly.  He denies chest pain, denies PND or orthopnea although he sleeps in a recliner.  Past Medical History:  Diagnosis Date  . Arthritis   . Back pain    occasionally  . BPH (benign prostatic hypertrophy)    takes Uroxatral daily as well as Finasteride   . Diabetes mellitus without complication ([place])    takes Metformin daily  . GERD (gastroesophageal reflux disease)    no meds  . Hepatitis hx of   at age 68  . Hypertension    takes Losartan and HCTZ daily  . Joint pain   . Urinary frequency   . Urinary urgency    Past Surgical History:  Procedure Laterality Date  . APPENDECTOMY     age 68  . COLONOSCOPY    . KNEE ARTHROSCOPY  1984   right and left   . SHOULDER ARTHROSCOPY  2003   right RCR-GSC-stayed overnight-had SOB  . SHOULDER ARTHROSCOPY WITH ROTATOR CUFF REPAIR AND SUBACROMIAL DECOMPRESSION Left  03/13/2013   Procedure: LEFT SHOULDER ARTHROSCOPY WITH SUBACROMIAL DECOMPRESSION, DISTAL CLAVICLE RESECTION AND REPAIR ROTATOR CUFF AND LABRAL DEBRIDEMENT;  Surgeon: [name]., MD;  Location: [place];  Service: Orthopedics;  Laterality: Left;  . TOTAL HIP ARTHROPLASTY Right 01/29/2015   Procedure: TOTAL HIP ARTHROPLASTY ANTERIOR APPROACH;  Surgeon: [name], MD;  Location: [place];  Service: Orthopedics;  Laterality: Right;  Marland Kitchen VASECTOMY    . [place]   Social History   Socioeconomic History  . Marital status: Married    Spouse name: Not on file  . Number of children: 3  . Years of education: Not on file  . Highest education level: Not on file  Occupational History  . Not on file  Social Needs  . Financial resource strain: Not on file  . Food insecurity    Worry: Not on file    Inability: Not on file  . Transportation needs    Medical: Not on file    Non-medical: Not on file  Tobacco Use  . Smoking status: Never Smoker  . Smokeless tobacco: Former Network engineer and Sexual Activity  . Alcohol use: Yes    Alcohol/week: 0.0 standard drinks    Comment: occasionally beer or scotch  . Drug use: No  . Sexual activity: Yes  Lifestyle  . Physical activity    Days  per week: Not on file    Minutes per session: Not on file  . Stress: Not on file  Relationships  . Social Herbalist on phone: Not on file    Gets together: Not on file    Attends religious service: Not on file    Active member of club or organization: Not on file    Attends meetings of clubs or organizations: Not on file    Relationship status: Not on file  . Intimate partner violence    Fear of current or ex partner: Not on file    Emotionally abused: Not on file    Physically abused: Not on file    Forced sexual activity: Not on file  Other Topics Concern  . Not on file  Social History Narrative  . Not on file   ROS  Review of Systems  Constitution:  Negative for chills, decreased appetite, malaise/fatigue, weight gain and weight loss.  Cardiovascular: Positive for dyspnea on exertion (chronic and stable) and leg swelling. Negative for orthopnea (sleeps in a recliner), paroxysmal nocturnal dyspnea and syncope.  Endocrine: Negative for cold intolerance.  Hematologic/Lymphatic: Does not bruise/bleed easily.  Musculoskeletal: Positive for arthritis (hands) and joint pain (right hip at total arthroplasty site). Negative for joint swelling.  Gastrointestinal: Positive for diarrhea (chronic). Negative for abdominal pain, anorexia, change in bowel habit, hematochezia and melena.  Neurological: Negative for headaches and light-headedness.  Psychiatric/Behavioral: Negative for depression and substance abuse.  All other systems reviewed and are negative.  Objective  There were no vitals taken for this visit. There is no height or weight on file to calculate BMI.   Physical Exam  Constitutional: He appears well-developed. No distress.  Morbidly obese  HENT:  Head: Atraumatic.  Eyes: Conjunctivae are normal.  Neck: Neck supple. No thyromegaly present.  Short neck and difficult to evaluate JVP  Cardiovascular: Normal rate, regular rhythm and normal heart sounds. Exam reveals no gallop.  No murmur heard. Pulses:      Carotid pulses are 2+ on the right side and 2+ on the left side.      Dorsalis pedis pulses are 2+ on the right side and 2+ on the left side.       Posterior tibial pulses are 2+ on the right side and 2+ on the left side.  Femoral pulse difficult to feel due to patient's body habitus. Bounding popliteal pulse bilateral.  Right pedal pulse normal in spite of edema. Left cannot be felt due to dressing from wound. Cannot make out JVD due to short neck.   Pulmonary/Chest: Effort normal and breath sounds normal.  Abdominal: Soft. Bowel sounds are normal.  Obese. Pannus present  Musculoskeletal: Normal range of motion.  Neurological: He  is alert.  Skin: Skin is warm and dry.  Psychiatric: He has a normal mood and affect.   Radiology: No results found.  Laboratory examination:    Labs 05/23/2019: Serum glucose 180 mg, BUN 11, creatinine 0.95, EGFR greater than 60 mL, potassium 4.0.  A1c 9.8%.  CMP Latest Ref Rng & Units 07/20/2018 06/26/2018 06/20/2018  Glucose 65 - 99 mg/dL 203(H) 173(H) 190(H)  BUN 8 - 27 mg/dL 25 21 44(H)  Creatinine 0.76 - 1.27 mg/dL 1.29(H) 1.10 1.65(H)  Sodium 134 - 144 mmol/L 142 141 138  Potassium 3.5 - 5.2 mmol/L 4.5 4.2 4.4  Chloride 96 - 106 mmol/L 97 103 95(L)  CO2 20 - 29 mmol/L _0 Calcium 8.6 -  10.2 mg/dL 9.0 8.9 8.1(L)  Total Protein 6.0 - 8.3 g/dL - - -  Total Bilirubin 0.3 - 1.2 mg/dL - - -  Alkaline Phos 39 - 117 U/L - - -  AST 0 - 37 U/L - - -  ALT 0 - 53 U/L - - -   CBC Latest Ref Rng & Units 01/31/2015 01/30/2015 01/29/2015  WBC 4.0 - 10.5 K/uL 10.6(H) 9.9 19.3(H)  Hemoglobin 13.0 - 17.0 g/dL 10.5(L) 11.6(L) 12.9(L)  Hematocrit 39.0 - 52.0 % 31.1(L) 34.3(L) 37.7(L)  Platelets 150 - 400 K/uL 161 204 211   Lipid Panel  No results found for: CHOL, TRIG, HDL, CHOLHDL, VLDL, LDLCALC, LDLDIRECT HEMOGLOBIN A1C No results found for: HGBA1C, MPG TSH No results for input(s): TSH in the last 8760 hours. Medications   Current Outpatient Medications  Medication Instructions  . Cholecalciferol (VITAMIN D3) 5000 UNITS TABS 1 tablet, Oral, Daily, Reported on 04/29/2016  . finasteride (PROSCAR) 5 mg, Oral, Daily  . glipiZIDE (GLUCOTROL XL) 5 mg, Oral, 3 times daily with meals  . metoprolol succinate (TOPROL-XL) 50 MG 24 hr tablet TAKE 1 TABLET BY MOUTH  DAILY WITH OR IMMEDIATLEY  FOLLOWING A MEAL  . potassium chloride SA (KLOR-CON M20) 20 MEQ tablet 20 mEq, Oral, Daily  . torsemide (DEMADEX) 40 mg, Oral, Daily    Cardiac Studies:   Lexiscan Sestamibi Stress Test 06/13/2017 at [facility]:  The left ventricular ejection fraction is moderately decreased (30-44%).  Nuclear stress EF:  39%.  There was no ST segment deviation noted during stress.  Defect 1: There is a small defect of mild severity present in the mid anteroseptal and apical septal location.   Low risk stress nuclear study with LBBB-related septal perfusion artifact and moderately reduced left ventricular global systolic function.  Echocardiogram 05/25/2017 :   Left ventricle: The cavity size was normal. There was moderate   concentric hypertrophy. Systolic function was mildly reduced. The   estimated ejection fraction was 50%. Hypokinesis of the   anteroseptal, inferior, and inferoseptal myocardium. Features are   consistent with a pseudonormal left ventricular filling pattern,   with concomitant abnormal relaxation and increased filling   pressure (grade 2 diastolic dysfunction). Doppler parameters are   consistent with indeterminate ventricular filling pressure. - Ventricular septum: Septal motion showed abnormal function and   dyssynchrony consistent with bundle branch block.  - Left atrium: The atrium was moderately dilated. - Right ventricle: The cavity size was normal. Wall thickness was   normal. Systolic function was normal.  Assessment   No diagnosis found.  EKG 06/21/2019: Normal sinus rhythm at rate of 95 bpm, left atrial abnormality, left bundle branch block.  No further analysis.  Recommendations:   Patient has made some lifestyle changes and has noticed some slight improvement in leg edema. Overall his dyspnea has been stable. Continue to feel that his leg edema is related to his obesity and possibly venous insufficiency. Although he has made some changes to his diet, he was counseled on further changes that he could make and healthier food options to choose. Advised on salty foods, including foods with hidden salt, and encouraged to avoid these. He has lost a few pounds since his last visit, hopefully, with continued diet changes, this will continue to improve. I have also encouraged him to start  using his stationary bicycle for exercise.  I will repeat his echocardiogram for evaluation of his leg edema. He reports that left leg ulceration has almost completely healed and is closely  being followed and managed by Home Health. No changes were made in medications today. Will see him back after his echocardiogram for follow up and further recommendations.   Miquel Dunn, MD, [facility] 09/10/2019, 8:18 AM [place] [facility]. [place] Pager: [phone] Office: [phone] If no answer Cell [phone]

## 2019-09-12 ENCOUNTER — Ambulatory Visit: Payer: Medicare Other | Admitting: Cardiology

## 2019-09-12 ENCOUNTER — Encounter: Payer: Self-pay | Admitting: Cardiology

## 2019-09-12 ENCOUNTER — Other Ambulatory Visit: Payer: Self-pay

## 2019-09-12 VITALS — BP 115/67 | HR 86 | Temp 97.5°F | Ht 69.0 in | Wt 299.9 lb

## 2019-09-12 DIAGNOSIS — Z6841 Body Mass Index (BMI) 40.0 and over, adult: Secondary | ICD-10-CM

## 2019-09-12 DIAGNOSIS — L97929 Non-pressure chronic ulcer of unspecified part of left lower leg with unspecified severity: Secondary | ICD-10-CM

## 2019-09-12 DIAGNOSIS — I5032 Chronic diastolic (congestive) heart failure: Secondary | ICD-10-CM

## 2019-09-12 MED ORDER — TORSEMIDE 20 MG PO TABS: 40.0000 mg | ORAL_TABLET | Freq: Every day | ORAL | 3 refills | Status: DC

## 2019-09-12 NOTE — Progress Notes (Signed)
Primary Physician/Referring:  Aura Dials, MD  Patient ID: [name]., male    DOB: [birth year], 68 y.o.   MRN: [id number]  Chief Complaint  Patient presents with  . Congestive Heart Failure  . Leg Swelling  . Follow-up   HPI:    HPI: [name].  is a 68 y.o. Caucasian male with hypertension, LBBB, uncontrolled diabetes mellitus, morbid obesity, chronic leg edema probably due to venous insufficiency from morbid obesity with recent ulceration left leg and now under [facility] management and healing, was previously seen by Dr. Candee Furbish at [facility], now established with [place] for diastolic heart failure.  Patient reports that he is overall doing well.  He continues to have leg swelling that improves with leg elevation, but he is not compliant with doing this due to he states that it is uncomfortable.  He continues to have issues with left lower extremity wound that is not healing.  He has dressing in place today, similar checkups with wound care nurse and is coming to his home.  He states that he is having some drainage from his wound and he is noted to have some purulent drainage from his dressing today.  He has not had any fever.  He has dyspnea on exertion if he over exerts himself, he is limited in his activity due to his hip pain.  He does sleep in a recliner, due to his hip pain, not due to shortness of breath with lying flat.  Denies any chest pain.  Requesting a refill on torsemide.  He underwent echocardiogram and is here to discuss results.  He admits to being not compliant with diet modifications that were instructed at his last visit to help with his diastolic heart failure and also weight loss.    Past Medical History:  Diagnosis Date  . Arthritis   . Back pain    occasionally  . BPH (benign prostatic hypertrophy)    takes Uroxatral daily as well as Finasteride   . Diabetes mellitus without complication ([place])    takes Metformin daily  . GERD  (gastroesophageal reflux disease)    no meds  . Hepatitis hx of   at age 68  . Hypertension    takes Losartan and HCTZ daily  . Joint pain   . Urinary frequency   . Urinary urgency    Past Surgical History:  Procedure Laterality Date  . APPENDECTOMY     age 49  . COLONOSCOPY    . KNEE ARTHROSCOPY  1984   right and left   . SHOULDER ARTHROSCOPY  2003   right RCR-GSC-stayed overnight-had SOB  . SHOULDER ARTHROSCOPY WITH ROTATOR CUFF REPAIR AND SUBACROMIAL DECOMPRESSION Left 03/13/2013   Procedure: LEFT SHOULDER ARTHROSCOPY WITH SUBACROMIAL DECOMPRESSION, DISTAL CLAVICLE RESECTION AND REPAIR ROTATOR CUFF AND LABRAL DEBRIDEMENT;  Surgeon: [name]., MD;  Location: [place];  Service: Orthopedics;  Laterality: Left;  . TOTAL HIP ARTHROPLASTY Right 01/29/2015   Procedure: TOTAL HIP ARTHROPLASTY ANTERIOR APPROACH;  Surgeon: [name], MD;  Location: [place];  Service: Orthopedics;  Laterality: Right;  Marland Kitchen VASECTOMY    . [place]   Social History   Socioeconomic History  . Marital status: Married    Spouse name: Not on file  . Number of children: 3  . Years of education: Not on file  . Highest education level: Not on file  Occupational History  . Not on file  Social Needs  .  Financial resource strain: Not on file  . Food insecurity    Worry: Not on file    Inability: Not on file  . Transportation needs    Medical: Not on file    Non-medical: Not on file  Tobacco Use  . Smoking status: Never Smoker  . Smokeless tobacco: Former Network engineer and Sexual Activity  . Alcohol use: Yes    Alcohol/week: 0.0 standard drinks    Comment: occasionally beer or scotch  . Drug use: No  . Sexual activity: Yes  Lifestyle  . Physical activity    Days per week: Not on file    Minutes per session: Not on file  . Stress: Not on file  Relationships  . Social Herbalist on phone: Not on file    Gets together: Not on file    Attends  religious service: Not on file    Active member of club or organization: Not on file    Attends meetings of clubs or organizations: Not on file    Relationship status: Not on file  . Intimate partner violence    Fear of current or ex partner: Not on file    Emotionally abused: Not on file    Physically abused: Not on file    Forced sexual activity: Not on file  Other Topics Concern  . Not on file  Social History Narrative  . Not on file   ROS  Review of Systems  Constitution: Negative for chills, decreased appetite, malaise/fatigue, weight gain and weight loss.  Cardiovascular: Positive for dyspnea on exertion (chronic and stable) and leg swelling. Negative for orthopnea (sleeps in a recliner), paroxysmal nocturnal dyspnea and syncope.  Endocrine: Negative for cold intolerance.  Hematologic/Lymphatic: Does not bruise/bleed easily.  Musculoskeletal: Positive for arthritis (hands) and joint pain (right hip at total arthroplasty site). Negative for joint swelling.  Gastrointestinal: Positive for diarrhea (chronic). Negative for abdominal pain, anorexia, change in bowel habit, hematochezia and melena.  Neurological: Negative for headaches and light-headedness.  Psychiatric/Behavioral: Negative for depression and substance abuse.  All other systems reviewed and are negative.  Objective  Blood pressure 115/67, pulse 86, temperature (!) 97.5 F (36.4 C), height '5\' 9"'$  (1.753 m), weight 299 lb 14.4 oz (136 kg), SpO2 96 %. Body mass index is 44.29 kg/m.   Physical Exam  Constitutional: He appears well-developed. No distress.  Morbidly obese  HENT:  Head: Atraumatic.  Eyes: Conjunctivae are normal.  Neck: Neck supple. No thyromegaly present.  Short neck and difficult to evaluate JVP  Cardiovascular: Normal rate, regular rhythm and normal heart sounds. Exam reveals no gallop.  No murmur heard. Pulses:      Carotid pulses are 2+ on the right side and 2+ on the left side.      Dorsalis  pedis pulses are 2+ on the right side and 2+ on the left side.       Posterior tibial pulses are 2+ on the right side and 2+ on the left side.  Femoral pulse difficult to feel due to patient's body habitus. Bounding popliteal pulse bilateral.  Right pedal pulse normal in spite of edema. Left cannot be felt due to dressing from wound. Cannot make out JVD due to short neck.   Pulmonary/Chest: Effort normal and breath sounds normal.  Abdominal: Soft. Bowel sounds are normal.  Obese. Pannus present  Musculoskeletal: Normal range of motion.  Neurological: He is alert.  Skin: Skin is warm and dry.  Psychiatric: He  has a normal mood and affect.  Vitals reviewed.  Radiology: No results found.  Laboratory examination:    Labs 05/23/2019: Serum glucose 180 mg, BUN 11, creatinine 0.95, EGFR greater than 60 mL, potassium 4.0.  A1c 9.8%.  CMP Latest Ref Rng & Units 07/20/2018 06/26/2018 06/20/2018  Glucose 65 - 99 mg/dL 203(H) 173(H) 190(H)  BUN 8 - 27 mg/dL 25 21 44(H)  Creatinine 0.76 - 1.27 mg/dL 1.29(H) 1.10 1.65(H)  Sodium 134 - 144 mmol/L 142 141 138  Potassium 3.5 - 5.2 mmol/L 4.5 4.2 4.4  Chloride 96 - 106 mmol/L 97 103 95(L)  CO2 20 - 29 mmol/L '28 23 25  '$ Calcium 8.6 - 10.2 mg/dL 9.0 8.9 8.1(L)  Total Protein 6.0 - 8.3 g/dL - - -  Total Bilirubin 0.3 - 1.2 mg/dL - - -  Alkaline Phos 39 - 117 U/L - - -  AST 0 - 37 U/L - - -  ALT 0 - 53 U/L - - -   CBC Latest Ref Rng & Units 01/31/2015 01/30/2015 01/29/2015  WBC 4.0 - 10.5 K/uL 10.6(H) 9.9 19.3(H)  Hemoglobin 13.0 - 17.0 g/dL 10.5(L) 11.6(L) 12.9(L)  Hematocrit 39.0 - 52.0 % 31.1(L) 34.3(L) 37.7(L)  Platelets 150 - 400 K/uL 161 204 211   Lipid Panel  No results found for: CHOL, TRIG, HDL, CHOLHDL, VLDL, LDLCALC, LDLDIRECT HEMOGLOBIN A1C No results found for: HGBA1C, MPG TSH No results for input(s): TSH in the last 8760 hours. Medications   Current Outpatient Medications  Medication Instructions  . Cholecalciferol (VITAMIN D3) 5000  UNITS TABS 1 tablet, Oral, Daily, Reported on 04/29/2016  . finasteride (PROSCAR) 5 mg, Oral, Daily  . GABAPENTIN PO Oral, As needed  . glipiZIDE (GLUCOTROL XL) 5 mg, Oral, 3 times daily with meals  . metoprolol succinate (TOPROL-XL) 50 MG 24 hr tablet TAKE 1 TABLET BY MOUTH  DAILY WITH OR IMMEDIATLEY  FOLLOWING A MEAL  . potassium chloride SA (KLOR-CON M20) 20 MEQ tablet 20 mEq, Oral, Daily  . torsemide (DEMADEX) 40 mg, Oral, Daily    Cardiac Studies:   Lexiscan Sestamibi Stress Test 06/13/2017 at [facility]:  The left ventricular ejection fraction is moderately decreased (30-44%).  Nuclear stress EF: 39%.  There was no ST segment deviation noted during stress.  Defect 1: There is a small defect of mild severity present in the mid anteroseptal and apical septal location.   Low risk stress nuclear study with LBBB-related septal perfusion artifact and moderately reduced left ventricular global systolic function.  Echocardiogram 08/15/2019: Left ventricle cavity is normal in size. Moderate concentric hypertrophy of the left ventricle. Mildly depressed LV systolic function with EF 50%. Normal global wall motion. Indeterminate diastolic filling pattern. Calculated EF 50%. Left atrial cavity is moderately dilated. No significant valvular abnormality.  Normal right atrial pressure.   Assessment     ICD-10-CM   1. Chronic diastolic heart failure (HCC)  I50.32 EKG 12-Lead  2. Lower extremity ulceration, left, with unspecified severity ([place])  L97.929 ABI WITH/WO TBI    Ambulatory referral to Vascular Surgery    AMB referral to wound care center  3. Class 3 severe obesity due to excess calories with serious comorbidity and body mass index (BMI) of 40.0 to 44.9 in adult [facility])  E66.01    Z68.41     EKG 09/12/2019: Normal sinus rhythm at rate of 90 bpm, left atrial abnormality, left bundle branch block.  No further analysis.   Recommendations:   I discussed echocardiogram results with the  patient that are essentially unchanged compared to his previous echocardiogram.  I suspect is multifactorial.  He was again counseled on the importance of diet modifications to help with his diastolic heart failure as well as weight loss.  He has not had any worsening shortness of breath.  Instructed him to try leg elevation to help with his leg edema.  I am concerned regarding his left lower extremity ulceration that is nonhealing and he clearly has purulent drainage from his dressing today.  I will make a referral to wound care center for further evaluation and treatment.  I am unable to further evaluate this today in view of dressing that is in place.  He will also need vascular evaluation, for possible etiologies of nonhealing ulceration.  He is also a diabetic which is likely contributing.  I will obtain ABI for evaluation.  We will have this performed at vein and vascular given his ulceration and needing dressing to be placed after the study.  I would like to see him back in approximately 3 weeks for close monitoring and to discuss ABI results.   *I have discussed this case with Dr. Einar Gip and participated in formulating the plan.*   Miquel Dunn, MSN, APRN, FNP-C [facility]. [place] Office: [phone] Fax: [phone]

## 2019-09-13 ENCOUNTER — Encounter: Payer: Self-pay | Admitting: Cardiology

## 2019-09-15 ENCOUNTER — Other Ambulatory Visit: Payer: Self-pay | Admitting: Physician Assistant

## 2019-09-15 DIAGNOSIS — I5032 Chronic diastolic (congestive) heart failure: Secondary | ICD-10-CM

## 2019-09-21 ENCOUNTER — Other Ambulatory Visit: Payer: Self-pay | Admitting: Physician Assistant

## 2019-09-27 ENCOUNTER — Other Ambulatory Visit: Payer: Self-pay

## 2019-09-27 ENCOUNTER — Encounter: Payer: Medicare Other | Attending: Physician Assistant | Admitting: Physician Assistant

## 2019-09-27 DIAGNOSIS — E11628 Type 2 diabetes mellitus with other skin complications: Secondary | ICD-10-CM | POA: Insufficient documentation

## 2019-09-27 DIAGNOSIS — I890 Lymphedema, not elsewhere classified: Secondary | ICD-10-CM | POA: Diagnosis not present

## 2019-09-27 DIAGNOSIS — E669 Obesity, unspecified: Secondary | ICD-10-CM | POA: Insufficient documentation

## 2019-09-27 DIAGNOSIS — I872 Venous insufficiency (chronic) (peripheral): Secondary | ICD-10-CM | POA: Insufficient documentation

## 2019-09-27 DIAGNOSIS — Z885 Allergy status to narcotic agent status: Secondary | ICD-10-CM | POA: Diagnosis not present

## 2019-09-27 DIAGNOSIS — Z6841 Body Mass Index (BMI) 40.0 and over, adult: Secondary | ICD-10-CM | POA: Insufficient documentation

## 2019-09-27 DIAGNOSIS — I5042 Chronic combined systolic (congestive) and diastolic (congestive) heart failure: Secondary | ICD-10-CM | POA: Insufficient documentation

## 2019-09-27 DIAGNOSIS — L02415 Cutaneous abscess of right lower limb: Secondary | ICD-10-CM | POA: Diagnosis not present

## 2019-09-27 DIAGNOSIS — I110 Hypertensive heart disease with heart failure: Secondary | ICD-10-CM | POA: Insufficient documentation

## 2019-09-27 DIAGNOSIS — L03116 Cellulitis of left lower limb: Secondary | ICD-10-CM | POA: Insufficient documentation

## 2019-09-27 DIAGNOSIS — Z881 Allergy status to other antibiotic agents status: Secondary | ICD-10-CM | POA: Diagnosis not present

## 2019-09-28 ENCOUNTER — Other Ambulatory Visit
Admission: RE | Admit: 2019-09-28 | Discharge: 2019-09-28 | Disposition: A | Discharge status: Home / Self Care | Payer: Medicare Other | Source: Ambulatory Visit | Attending: Physician Assistant | Admitting: Physician Assistant

## 2019-09-28 DIAGNOSIS — L089 Local infection of the skin and subcutaneous tissue, unspecified: Secondary | ICD-10-CM | POA: Insufficient documentation

## 2019-09-28 NOTE — Progress Notes (Signed)
[name] Visit Report for 09/27/2019 Chief Complaint Document Details Patient Name: [name], [name]. Date of Service: 09/27/2019 9:45 AM Medical Record Number: [id number] Patient Account Number: [id number] Date of Birth/Sex: [birth year] (68 y.o. M) Treating RN: Army Melia Primary Care Provider: Aura Dials Other Clinician: Referring Provider: Aura Dials Treating Provider/Extender: Melburn Hake, Mccormick Macon Weeks in Treatment: 0 Information Obtained from: Patient Chief Complaint Bilateral LE lymphedema left leg cellulitis Electronic Signature(s) Signed: 09/27/2019 10:45:14 AM By: Worthy Keeler PA-C Previous Signature: 09/27/2019 10:15:42 AM Version By: Worthy Keeler PA-C Entered By: Worthy Keeler on 09/27/2019 10:45:14 [name], [name] -------------------------------------------------------------------------------- HPI Details Patient Name: [name]. Date of Service: 09/27/2019 9:45 AM Medical Record Number: [id number] Patient Account Number: [id number] Date of Birth/Sex: [birth year] (68 y.o. M) Treating RN: Army Melia Primary Care Provider: Aura Dials Other Clinician: Referring Provider: Aura Dials Treating Provider/Extender: Melburn Hake, Senta Kantor Weeks in Treatment: 0 History of Present Illness HPI Description: 09/27/2019 on evaluation today patient appears to be doing poorly in regard to his bilateral lower extremities he does have bilateral lower extremity lymphedema. With that being said he also has a history of diabetes type 2, hypertension, congestive heart failure, and apparently has chronic venous insufficiency which has led to the lymphedema. Currently he also shows signs of an infection of the left lower extremity which does have me concerned at this point as well. He tells me that he is not really having any significant pain but has had a lot of trouble with his legs in general. He does have a compression stocking he is  wearing on the right lower extremity he was wrapped with an Unna boot on the left lower extremity. The patient tells me that overall he has been doing with this for several weeks and home health has been initiated. He has not been on any antibiotics yet and he tells me that the home health nurse was not sure that it was infected based on what I see today I am concerned that this likely is infected at this point. Fortunately however there does not appear to be any evidence of systemic infection which is good news. The Unna boot does seem to be helping control the edema although he may benefit from a stronger compression wrap I think that at this point without having the ABIs completed the Unna boot is where I would stand. He is supposed to be having an arterial study with ABI as ordered by his cardiologist but he states he may have missed that appointment and has not called to reschedule he needs to contact them to get this rescheduled as soon as possible I explained to him. He tells me that he is draining a lot as far as the left lower extremity is concerned and that is something that he feels like is not lasting very long as far as the wraps are concerned when they put him on they seem to get wet extremely quickly. Electronic Signature(s) Signed: 09/27/2019 10:46:42 AM By: Worthy Keeler PA-C Entered By: Worthy Keeler on 09/27/2019 10:46:41 [name], [name] -------------------------------------------------------------------------------- Physical Exam Details Patient Name: [name]. Date of Service: 09/27/2019 9:45 AM Medical Record Number: [id number] Patient Account Number: [id number] Date of Birth/Sex: 1951-01-15 (68 y.o. M) Treating RN: Army Melia Primary Care Provider: Aura Dials Other Clinician: Referring Provider: Aura Dials Treating Provider/Extender: STONE III, Manpreet Strey Weeks in Treatment: 0 Constitutional patient is hypertensive.. pulse regular and  within target range for patient.Marland Kitchen  respirations regular, non-labored and within target range for patient.Marland Kitchen temperature within target range for patient.. Well-nourished and well-hydrated in no acute distress. Eyes conjunctiva clear no eyelid edema noted. pupils equal round and reactive to light and accommodation. Ears, Nose, Mouth, and Throat no gross abnormality of ear auricles or external auditory canals. normal hearing noted during conversation. mucus membranes moist. Respiratory normal breathing without difficulty. clear to auscultation bilaterally. Cardiovascular regular rate and rhythm with normal S1, S2. 2+ dorsalis pedis/posterior tibialis pulses. Patient has bilateral lower extremity stage II lymphedema.. Gastrointestinal (GI) soft, non-tender, non-distended, +BS. no ventral hernia noted. Musculoskeletal normal gait and posture. no significant deformity or arthritic changes, no loss or range of motion, no clubbing. Psychiatric this patient is able to make decisions and demonstrates good insight into disease process. Alert and Oriented x 3. pleasant and cooperative. Notes Upon inspection today patient's wound really is just on the left there did not appear to be any wounds on the right lower extremity at this time. With that being said he continues to experience extreme drainage on the left lower extremity which is actually saturating the compression wrap and causing him issues at this point. Fortunately there are no signs of active infection. He seems to have good pulses but we have not been able to confirm this with an arterial study at this point I think that would be helpful for him to have a soon as possible the orders already been put in but he needs to contact the office to get that rescheduled he believes he may have missed the first 1. Electronic Signature(s) Signed: 09/27/2019 10:47:41 AM By: Worthy Keeler PA-C Entered By: Worthy Keeler on 09/27/2019 10:47:41 [name],  [name] -------------------------------------------------------------------------------- Physician Orders Details Patient Name: [name]. Date of Service: 09/27/2019 9:45 AM Medical Record Number: [id number] Patient Account Number: [id number] Date of Birth/Sex: [birth year] (68 y.o. M) Treating RN: Army Melia Primary Care Provider: Aura Dials Other Clinician: Referring Provider: Aura Dials Treating Provider/Extender: Melburn Hake, Shaheed Schmuck Weeks in Treatment: 0 Verbal / Phone Orders: No Diagnosis Coding ICD-10 Coding Code Description I89.0 Lymphedema, not elsewhere classified I87.2 Venous insufficiency (chronic) (peripheral) E11.628 Type 2 diabetes mellitus with other skin complications 99991111 Essential (primary) hypertension I50.42 Chronic combined systolic (congestive) and diastolic (congestive) heart failure L02.416 Cutaneous abscess of left lower limb L02.415 Cutaneous abscess of right lower limb Wound Cleansing o Clean wound with Normal Saline. - in office o Cleanse wound with mild soap and water - left leg Primary Wound Dressing o XtraSorb - over weeping areas on the left leg Dressing Change Frequency o Dressing is to be changed Monday and Thursday. - HH to change wrap on Mondays Follow-up Appointments o Return Appointment in 1 week. Edema Control o Unna Boot to Left Lower Extremity - ABD pads under unna boot over shin areas for protection [place]ane Visits - [place] Nurse may visit PRN to address patientos wound care needs. o FACE TO FACE ENCOUNTER: MEDICARE and MEDICAID PATIENTS: I certify that this patient is under my care and that I had a face-to-face encounter that meets the physician face-to-face encounter requirements with this patient on this date. The encounter with the patient was in whole or in part for the following MEDICAL CONDITION: (primary reason for [place]) MEDICAL  NECESSITY: I certify, that based on my findings, NURSING services are a medically necessary home health service. HOME BOUND STATUS: I certify that my clinical findings support that  this patient is homebound (i.e., Due to illness or injury, pt requires aid of supportive devices such as crutches, cane, wheelchairs, walkers, the use of special transportation or the assistance of another person to leave their place of residence. There is a normal inability to leave the home and doing so requires considerable and taxing effort. Other absences are for medical reasons / religious services and are infrequent or of short duration when for other reasons). o If current dressing causes regression in wound condition, may D/C ordered dressing product/s and apply Normal Saline Moist Dressing daily until next [place] / Other MD appointment. [place] of regression in wound condition at [phone]. o Please direct any NON-WOUND related issues/requests for orders to patient's Primary Care Physician Laboratory [name], [name] o Bacteria identified in Wound by Culture (MICRO) - left lower leg oooo LOINC Code: [id number] oooo Convenience Name: Wound culture routine Patient Medications Allergies: codeine Notifications Medication Indication Start End Cipro 09/27/2019 DOSE 1 - oral 500 mg tablet - 1 tablet oral taken 2 times a day for 10 days Electronic Signature(s) Signed: 09/27/2019 10:49:51 AM By: Worthy Keeler PA-C Entered By: Worthy Keeler on 09/27/2019 10:49:50 Alers, [name] -------------------------------------------------------------------------------- Problem List Details Patient Name: [name]. Date of Service: 09/27/2019 9:45 AM Medical Record Number: [id number] Patient Account Number: [id number] Date of Birth/Sex: Dec 05, 1950 (69 y.o. M) Treating RN: Army Melia Primary Care Provider: Aura Dials Other  Clinician: Referring Provider: Aura Dials Treating Provider/Extender: Melburn Hake, Kohl Polinsky Weeks in Treatment: 0 Active Problems ICD-10 Evaluated Encounter Code Description Active Date Today Diagnosis I89.0 Lymphedema, not elsewhere classified 09/27/2019 No Yes I87.2 Venous insufficiency (chronic) (peripheral) 09/27/2019 No Yes E11.628 Type 2 diabetes mellitus with other skin complications 123XX123 No Yes I10 Essential (primary) hypertension 09/27/2019 No Yes I50.42 Chronic combined systolic (congestive) and diastolic 123XX123 No Yes (congestive) heart failure L02.416 Cutaneous abscess of left lower limb 09/27/2019 No Yes Inactive Problems Resolved Problems Electronic Signature(s) Signed: 09/27/2019 10:44:46 AM By: Worthy Keeler PA-C Previous Signature: 09/27/2019 10:15:12 AM Version By: Worthy Keeler PA-C Entered By: Worthy Keeler on 09/27/2019 10:44:46 Broshears, [name] -------------------------------------------------------------------------------- Progress Note Details Patient Name: [name]. Date of Service: 09/27/2019 9:45 AM Medical Record Number: [id number] Patient Account Number: [id number] Date of Birth/Sex: 1950-12-08 (68 y.o. M) Treating RN: Army Melia Primary Care Provider: Aura Dials Other Clinician: Referring Provider: Aura Dials Treating Provider/Extender: Melburn Hake, Nathanial Arrighi Weeks in Treatment: 0 Subjective Chief Complaint Information obtained from Patient Bilateral LE lymphedema left leg cellulitis History of Present Illness (HPI) 09/27/2019 on evaluation today patient appears to be doing poorly in regard to his bilateral lower extremities he does have bilateral lower extremity lymphedema. With that being said he also has a history of diabetes type 2, hypertension, congestive heart failure, and apparently has chronic venous insufficiency which has led to the lymphedema. Currently he also shows signs of an infection of the  left lower extremity which does have me concerned at this point as well. He tells me that he is not really having any significant pain but has had a lot of trouble with his legs in general. He does have a compression stocking he is wearing on the right lower extremity he was wrapped with an Unna boot on the left lower extremity. The patient tells me that overall he has been doing with this for several weeks and home health has been initiated. He has not been on any antibiotics  yet and he tells me that the home health nurse was not sure that it was infected based on what I see today I am concerned that this likely is infected at this point. Fortunately however there does not appear to be any evidence of systemic infection which is good news. The Unna boot does seem to be helping control the edema although he may benefit from a stronger compression wrap I think that at this point without having the ABIs completed the Unna boot is where I would stand. He is supposed to be having an arterial study with ABI as ordered by his cardiologist but he states he may have missed that appointment and has not called to reschedule he needs to contact them to get this rescheduled as soon as possible I explained to him. He tells me that he is draining a lot as far as the left lower extremity is concerned and that is something that he feels like is not lasting very long as far as the wraps are concerned when they put him on they seem to get wet extremely quickly. Patient History Allergies codeine (Reaction: swelling) Family History Cancer - Paternal Grandparents, Diabetes - Mother, No family history of Heart Disease, Hypertension, Kidney Disease, Lung Disease, Seizures, Stroke, Thyroid Problems, Tuberculosis. Social History Former smoker - 31 years ago, Marital Status - Married, Alcohol Use - Rarely, Drug Use - No History, Caffeine Use - Daily. Medical History Cardiovascular Patient has history of Congestive  Heart Failure Endocrine Patient has history of Type II Diabetes Musculoskeletal Patient has history of Osteoarthritis - Hands Patient is treated with Oral Agents. Blood sugar is not tested. [name] Review of Systems (ROS) Eyes Denies complaints or symptoms of Dry Eyes, Vision Changes, Glasses / Contacts. Ear/Nose/Mouth/Throat Denies complaints or symptoms of Difficult clearing ears, Sinusitis. Hematologic/Lymphatic Denies complaints or symptoms of Bleeding / Clotting Disorders, Human Immunodeficiency Virus. Respiratory Denies complaints or symptoms of Chronic or frequent coughs, Shortness of Breath. Gastrointestinal Denies complaints or symptoms of Frequent diarrhea, Nausea, Vomiting. Genitourinary Denies complaints or symptoms of Kidney failure/ Dialysis, Incontinence/dribbling. Immunological Complains or has symptoms of Itching - Back. Denies complaints or symptoms of Hives. Integumentary (Skin) Denies complaints or symptoms of Wounds, Bleeding or bruising tendency, Breakdown, Swelling. Musculoskeletal Denies complaints or symptoms of Muscle Pain, Muscle Weakness. Neurologic Denies complaints or symptoms of Numbness/parasthesias, Focal/Weakness. Psychiatric Denies complaints or symptoms of Anxiety, Claustrophobia. Objective Constitutional patient is hypertensive.. pulse regular and within target range for patient.Marland Kitchen respirations regular, non-labored and within target range for patient.Marland Kitchen temperature within target range for patient.. Well-nourished and well-hydrated in no acute distress. Vitals Time Taken: 9:51 AM, Height: 69 in, Source: Stated, Weight: 295 lbs, Source: Measured, BMI: 43.6, Temperature: 98.7 F, Pulse: 53 bpm, Respiratory Rate: 16 breaths/min, Blood Pressure: 135/53 mmHg. Eyes conjunctiva clear no eyelid edema noted. pupils equal round and reactive to light and accommodation. Ears, Nose, Mouth, and Throat no gross abnormality of ear  auricles or external auditory canals. normal hearing noted during conversation. mucus membranes moist. Respiratory normal breathing without difficulty. clear to auscultation bilaterally. Cardiovascular regular rate and rhythm with normal S1, S2. 2+ dorsalis pedis/posterior tibialis pulses. Patient has bilateral lower extremity stage II lymphedema.. Gastrointestinal (GI) soft, non-tender, non-distended, +BS. no ventral hernia noted. [name], [name] Musculoskeletal normal gait and posture. no significant deformity or arthritic changes, no loss or range of motion, no clubbing. Psychiatric this patient is able to make decisions and demonstrates good insight into disease process. Alert  and Oriented x 3. pleasant and cooperative. General Notes: Upon inspection today patient's wound really is just on the left there did not appear to be any wounds on the right lower extremity at this time. With that being said he continues to experience extreme drainage on the left lower extremity which is actually saturating the compression wrap and causing him issues at this point. Fortunately there are no signs of active infection. He seems to have good pulses but we have not been able to confirm this with an arterial study at this point I think that would be helpful for him to have a soon as possible the orders already been put in but he needs to contact the office to get that rescheduled he believes he may have missed the first 1. Other Condition(s) Patient presents with Lymphedema located on the Bilateral Leg. General Notes: Patient has redness and weeping on Left Lower Leg. Assessment Active Problems ICD-10 Lymphedema, not elsewhere classified Venous insufficiency (chronic) (peripheral) Type 2 diabetes mellitus with other skin complications Essential (primary) hypertension Chronic combined systolic (congestive) and diastolic (congestive) heart failure Cutaneous abscess of left lower  limb Procedures There was a Haematologist Compression Therapy Procedure by Army Melia, RN. Post procedure Diagnosis Wound #: Same as Pre-Procedure Plan Wound Cleansing: Clean wound with Normal Saline. - in office Cleanse wound with mild soap and water - left leg Primary Wound Dressing: XtraSorb - over weeping areas on the left leg Dressing Change Frequency: [name], [name] Dressing is to be changed Monday and Thursday. - HH to change wrap on Mondays Follow-up Appointments: Return Appointment in 1 week. Edema Control: Unna Boot to Left Lower Extremity - ABD pads under unna boot over shin areas for protection Home Health: [place] Nurse may visit PRN to address patient s wound care needs. FACE TO FACE ENCOUNTER: MEDICARE and MEDICAID PATIENTS: I certify that this patient is under my care and that I had a face-to-face encounter that meets the physician face-to-face encounter requirements with this patient on this date. The encounter with the patient was in whole or in part for the following MEDICAL CONDITION: (primary reason for [place]) MEDICAL NECESSITY: I certify, that based on my findings, NURSING services are a medically necessary home health service. HOME BOUND STATUS: I certify that my clinical findings support that this patient is homebound (i.e., Due to illness or injury, pt requires aid of supportive devices such as crutches, cane, wheelchairs, walkers, the use of special transportation or the assistance of another person to leave their place of residence. There is a normal inability to leave the home and doing so requires considerable and taxing effort. Other absences are for medical reasons / religious services and are infrequent or of short duration when for other reasons). If current dressing causes regression in wound condition, may D/C ordered dressing product/s and apply Normal Saline Moist Dressing daily until  next [place] / Other MD appointment. [place] of regression in wound condition at [phone]. Please direct any NON-WOUND related issues/requests for orders to patient's Primary Care Physician Laboratory ordered were: Wound culture routine - left lower leg The following medication(s) was prescribed: Cipro oral 500 mg tablet 1 1 tablet oral taken 2 times a day for 10 days starting 09/27/2019 1. My suggestion at this time is good to be that we go ahead and initiate treatment with XtraSorb to the left lower extremity to catch the drainage we will  subsequently also utilize the [employer] compression wrap at least until we can get approval or rather evaluation with the arterial study to confirm that the patient has good arterial flow I would not want to go with anything stronger although we may need to consider that in the future. 2. We will go ahead and reinitiate the Unna boot wrap which has been previously recommended for the patient and he seems to be doing well with that. 3. I am in a suggest as well going ahead and placing the patient on Cipro for what appears to be a likely Pseudomonas infection based on the odor as well as the blue-green drainage this that he is experiencing at this point. There were some potential interactions with his current medications but again I do believe this is going to be necessary for him to help treat the likely infection here and this is previously been discussed with Dr. Dellia Nims as well for short course he feels like this is safe to do. That was documented as well and the medication override for interactions. 4. I do think the patient needs to elevate his leg is much as possible and for his right lower extremity needs to continue to wear his compression stocking. We will see patient back for reevaluation in 1 week here in the clinic. If anything worsens or changes patient will contact our office for additional  recommendations. Electronic Signature(s) Signed: 09/27/2019 10:59:48 AM By: Worthy Keeler PA-C Entered By: Worthy Keeler on 09/27/2019 10:59:47 [name], [name] -------------------------------------------------------------------------------- ROS/PFSH Details Patient Name: [name]. Date of Service: 09/27/2019 9:45 AM Medical Record Number: [id number] Patient Account Number: [id number] Date of Birth/Sex: 1951-10-29 (68 y.o. M) Treating RN: Cornell Barman Primary Care Provider: Aura Dials Other Clinician: Referring Provider: Aura Dials Treating Provider/Extender: Melburn Hake, Chelcee Korpi Weeks in Treatment: 0 Eyes Complaints and Symptoms: Negative for: Dry Eyes; Vision Changes; Glasses / Contacts Ear/Nose/Mouth/Throat Complaints and Symptoms: Negative for: Difficult clearing ears; Sinusitis Hematologic/Lymphatic Complaints and Symptoms: Negative for: Bleeding / Clotting Disorders; Human Immunodeficiency Virus Respiratory Complaints and Symptoms: Negative for: Chronic or frequent coughs; Shortness of Breath Gastrointestinal Complaints and Symptoms: Negative for: Frequent diarrhea; Nausea; Vomiting Genitourinary Complaints and Symptoms: Negative for: Kidney failure/ Dialysis; Incontinence/dribbling Immunological Complaints and Symptoms: Positive for: Itching - Back Negative for: Hives Integumentary (Skin) Complaints and Symptoms: Negative for: Wounds; Bleeding or bruising tendency; Breakdown; Swelling Musculoskeletal Complaints and Symptoms: Negative for: Muscle Pain; Muscle Weakness Medical History: Positive for: Osteoarthritis - Hands Neurologic [name], [name] ([id number]) Complaints and Symptoms: Negative for: Numbness/parasthesias; Focal/Weakness Psychiatric Complaints and Symptoms: Negative for: Anxiety; Claustrophobia Cardiovascular Medical History: Positive for: Congestive Heart Failure Endocrine Medical History: Positive for: Type II  Diabetes Time with diabetes: 1 year Treated with: Oral agents Blood sugar tested every day: No Oncologic Immunizations Pneumococcal Vaccine: Received Pneumococcal Vaccination: No Implantable Devices None Family and Social History Cancer: Yes - Paternal Grandparents; Diabetes: Yes - Mother; Heart Disease: No; Hypertension: No; Kidney Disease: No; Lung Disease: No; Seizures: No; Stroke: No; Thyroid Problems: No; Tuberculosis: No; Former smoker - 51 years ago; Marital Status - Married; Alcohol Use: Rarely; Drug Use: No History; Caffeine Use: Daily Electronic Signature(s) Signed: 09/27/2019 3:41:54 PM By: Worthy Keeler PA-C Signed: 09/28/2019 3:15:39 PM By: Gretta Cool, BSN, RN, CWS, Kim RN, BSN Entered By: Gretta Cool, BSN, RN, CWS, Kim on 09/27/2019 09:59:52 [name], [name] -------------------------------------------------------------------------------- SuperBill Details Patient Name: [name]. Date of Service: 09/27/2019 Medical Record Number: [id number] Patient Account Number:  [id number] Date of Birth/Sex: [birth year] (68 y.o. 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 99991111 Essential (primary) hypertension I50.42 Chronic combined systolic (congestive) and diastolic (congestive) heart failure L02.416 Cutaneous abscess of left lower limb L02.415 Cutaneous abscess of right lower limb Facility Procedures CPT4 Code: [id number] Description: (Facility Use Only) [id number] - APPLY UNNA BOOT LT Modifier: Quantity: 1 Physician Procedures CPT4 Code: [id number] Description: [id number] - WC PHYS LEVEL 4 - NEW PT ICD-10 Diagnosis Description I89.0  Lymphedema, not elsewhere classified I87.2 Venous insufficiency (chronic) (peripheral) 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

## 2019-09-28 NOTE — Progress Notes (Signed)
SOTIRIOS, RADDEN ([id number]) Visit Report for 09/27/2019 Allergy List Details Patient Name: [name], [name]. Date of Service: 09/27/2019 9:45 AM Medical Record Number: [id number] Patient Account Number: [id number] Date of Birth/Sex: May 04, 1951 (68 y.o. M) Treating RN: Cornell Barman Primary Care Shanina Kepple: Aura Dials Other Clinician: Referring Abbagayle Zaragoza: Aura Dials Treating Ritisha Deitrick/Extender: Melburn Hake, HOYT Weeks in Treatment: 0 Allergies Active Allergies codeine Reaction: swelling Allergy Notes Electronic Signature(s) Signed: 09/28/2019 3:15:39 PM By: Gretta Cool, BSN, RN, CWS, Kim RN, BSN Entered By: Gretta Cool, BSN, RN, CWS, Kim on 09/27/2019 09:54:00 [name], [name] -------------------------------------------------------------------------------- Arrival Information Details Patient Name: [name]. Date of Service: 09/27/2019 9:45 AM Medical Record Number: [id number] Patient Account Number: [id number] Date of Birth/Sex: [birth year] (68 y.o. M) Treating RN: Cornell Barman Primary Care Alistar Mcenery: Aura Dials Other Clinician: Referring Derinda Bartus: Aura Dials Treating Saanya Zieske/Extender: Melburn Hake, HOYT Weeks in Treatment: 0 Visit Information Patient Arrived: Ambulatory Arrival Time: 09:49 Accompanied By: self Transfer Assistance: None Patient Identification Verified: Yes Secondary Verification Process Completed: Yes Patient Requires Transmission-Based No Precautions: Patient Has Alerts: Yes Patient Alerts: DM II Electronic Signature(s) Signed: 09/28/2019 3:15:39 PM By: Gretta Cool, BSN, RN, CWS, Kim RN, BSN Entered By: Gretta Cool, BSN, RN, CWS, Kim on 09/27/2019 09:50:59 [name], [name] -------------------------------------------------------------------------------- Clinic Level of Care Assessment Details Patient Name: [name], [name]. Date of Service: 09/27/2019 9:45 AM Medical Record Number: [id number] Patient Account Number: [id number] Date of  Birth/Sex: 1951-05-23 (68 y.o. M) Treating RN: Army Melia Primary Care Maizey Menendez: Aura Dials Other Clinician: Referring Amylia Collazos: Aura Dials Treating Cailynn Bodnar/Extender: Melburn Hake, HOYT Weeks in Treatment: 0 Clinic Level of Care Assessment Items TOOL 1 Quantity Score []  - Use when EandM and Procedure is performed on INITIAL visit 0 ASSESSMENTS - Nursing Assessment / Reassessment X - General Physical Exam (combine w/ comprehensive assessment (listed just below) when 1 20 performed on new pt. evals) X- 1 25 Comprehensive Assessment (HX, ROS, Risk Assessments, Wounds Hx, etc.) ASSESSMENTS - Wound and Skin Assessment / Reassessment []  - Dermatologic / Skin Assessment (not related to wound area) 0 ASSESSMENTS - Ostomy and/or Continence Assessment and Care []  - Incontinence Assessment and Management 0 []  - 0 Ostomy Care Assessment and Management (repouching, etc.) PROCESS - Coordination of Care X - Simple Patient / Family Education for ongoing care 1 15 []  - 0 Complex (extensive) Patient / Family Education for ongoing care X- 1 10 Staff obtains Programmer, systems, Records, Test Results / Process Orders []  - 0 Staff telephones HHA, Nursing Homes / Clarify orders / etc []  - 0 Routine Transfer to another Facility (non-emergent condition) []  - 0 Routine Hospital Admission (non-emergent condition) X- 1 15 New Admissions / Biomedical engineer / Ordering NPWT, Apligraf, etc. []  - 0 Emergency Hospital Admission (emergent condition) PROCESS - Special Needs []  - Pediatric / Minor Patient Management 0 []  - 0 Isolation Patient Management []  - 0 Hearing / Language / Visual special needs []  - 0 Assessment of Community assistance (transportation, D/C planning, etc.) []  - 0 Additional assistance / Altered mentation []  - 0 Support Surface(s) Assessment (bed, cushion, seat, etc.) [name], [name] ([id number]) INTERVENTIONS - Miscellaneous []  - External ear exam 0 []  - 0 Patient  Transfer (multiple staff / Civil Service fast streamer / Similar devices) []  - 0 Simple Staple / Suture removal (25 or less) []  - 0 Complex Staple / Suture removal (26 or more) []  - 0 Hypo/Hyperglycemic Management (do not check if billed separately) []  - 0 Ankle / Brachial Index (ABI) - do not  check if billed separately Has the patient been seen at the hospital within the last three years: Yes Total Score: 85 Level Of Care: New/Established - Level 3 Electronic Signature(s) Signed: 09/27/2019 3:26:34 PM By: Army Melia Entered By: Army Melia on 09/27/2019 14:14:30 [name], [name] -------------------------------------------------------------------------------- Compression Therapy Details Patient Name: [name]. Date of Service: 09/27/2019 9:45 AM Medical Record Number: [id number] Patient Account Number: [id number] Date of Birth/Sex: Sep 29, 1951 (68 y.o. M) Treating RN: Army Melia Primary Care Meeya Goldin: Aura Dials Other Clinician: Referring Gaius Ishaq: Aura Dials Treating Karmina Zufall/Extender: STONE III, HOYT Weeks in Treatment: 0 Compression Therapy Performed for Wound Assessment: NonWound Condition Lymphedema - Bilateral Leg Performed By: Clinician Army Melia, RN Compression Type: Rolena Infante Post Procedure Diagnosis Same as Pre-procedure Electronic Signature(s) Signed: 09/27/2019 10:38:18 AM By: Army Melia Entered By: Army Melia on 09/27/2019 10:38:17 [name], [name] -------------------------------------------------------------------------------- Encounter Discharge Information Details Patient Name: [name]. Date of Service: 09/27/2019 9:45 AM Medical Record Number: [id number] Patient Account Number: [id number] Date of Birth/Sex: [birth year] (68 y.o. M) Treating RN: Army Melia Primary Care Seleen Walter: Aura Dials Other Clinician: Referring Leontine Radman: Aura Dials Treating Humza Tallerico/Extender: Melburn Hake, HOYT Weeks in Treatment:  0 Encounter Discharge Information Items Discharge Condition: Stable Ambulatory Status: Ambulatory Discharge Destination: Home Transportation: Private Auto Accompanied By: self Schedule Follow-up Appointment: Yes Clinical Summary of Care: Electronic Signature(s) Signed: 09/27/2019 10:43:10 AM By: Army Melia Entered By: Army Melia on 09/27/2019 10:43:09 Teicher, [name] -------------------------------------------------------------------------------- Lower Extremity Assessment Details Patient Name: [name]. Date of Service: 09/27/2019 9:45 AM Medical Record Number: [id number] Patient Account Number: [id number] Date of Birth/Sex: 02-11-1951 (68 y.o. M) Treating RN: Cornell Barman Primary Care Morrison Masser: Aura Dials Other Clinician: Referring Shelaine Frie: Aura Dials Treating Allix Blomquist/Extender: Melburn Hake, HOYT Weeks in Treatment: 0 Edema Assessment Assessed: [Left: Yes] [Right: Yes] Edema: [Left: Yes] [Right: Yes] Calf Left: Right: Point of Measurement: 36 cm From Medial Instep 47 cm 53.5 cm Ankle Left: Right: Point of Measurement: 12 cm From Medial Instep 32.7 cm 32 cm Vascular Assessment Pulses: Dorsalis Pedis Palpable: [Left:Yes] [Right:Yes] Doppler Audible: [Left:Yes] [Right:Yes] Posterior Tibial Palpable: [Left:Yes Yes] [Right:Yes Yes] Notes Right DP, PT >220 Left DP PT Electronic Signature(s) Signed: 09/28/2019 3:15:39 PM By: Gretta Cool, BSN, RN, CWS, Kim RN, BSN Entered By: Gretta Cool, BSN, RN, CWS, Kim on 09/27/2019 10:06:08 Doleman, [name] -------------------------------------------------------------------------------- Multi Wound Chart Details Patient Name: [name]. Date of Service: 09/27/2019 9:45 AM Medical Record Number: [id number] Patient Account Number: [id number] Date of Birth/Sex: [birth year] (68 y.o. M) Treating RN: Army Melia Primary Care Meria Crilly: Aura Dials Other Clinician: Referring Betsaida [place]: Aura Dials Treating Avonte Sensabaugh/Extender: Melburn Hake, HOYT Weeks in Treatment: 0 Vital Signs Height(in): 69 Pulse(bpm): 53 Weight(lbs): [id number] Blood Pressure(mmHg): 135/53 Body Mass Index(BMI): 44 Temperature(F): 98.7 Respiratory Rate 16 (breaths/min): Wound Assessments Treatment Notes Electronic Signature(s) Signed: 09/27/2019 3:26:34 PM By: Army Melia Entered By: Army Melia on 09/27/2019 10:23:27 [name] -------------------------------------------------------------------------------- [place] Details Patient Name: SHARVIL, ACCETTA. Date of Service: 09/27/2019 9:45 AM Medical Record Number: [id number] Patient Account Number: [id number] Date of Birth/Sex: 01-11-1951 (68 y.o. M) Treating RN: Army Melia Primary Care Donnisha Besecker: Aura Dials Other Clinician: Referring Brailyn Delman: Aura Dials Treating Rubee Vega/Extender: Melburn Hake, HOYT Weeks in Treatment: 0 Active Inactive Nutrition Nursing Diagnoses: Impaired glucose control: actual or potential Goals: Patient/caregiver verbalizes understanding of need to maintain therapeutic glucose control per primary care physician Date Initiated: 09/27/2019 Target Resolution Date: 10/19/2019 Goal Status: Active Interventions: Assess HgA1c  results as ordered upon admission and as needed Notes: Orientation to the Wound Care Program Nursing Diagnoses: Knowledge deficit related to the wound healing center program Goals: Patient/caregiver will verbalize understanding of the [place] Program Date Initiated: 09/27/2019 Target Resolution Date: 10/19/2019 Goal Status: Active Interventions: Provide education on orientation to the wound center Notes: Wound/Skin Impairment Nursing Diagnoses: Impaired tissue integrity Goals: Ulcer/skin breakdown will have a volume reduction of 30% by week 4 Date Initiated: 09/27/2019 Target Resolution Date: 10/25/2019 Goal Status:  Active Interventions: Assess ulceration(s) every visit [name], [name] Notes: Electronic Signature(s) Signed: 09/27/2019 3:26:34 PM By: Army Melia Entered By: Army Melia on 09/27/2019 10:23:02 [name], [name] -------------------------------------------------------------------------------- Non-Wound Condition Assessment Details Patient Name: [name]. Date of Service: 09/27/2019 9:45 AM Medical Record Number: [id number] Patient Account Number: [id number] Date of Birth/Sex: Mar 06, 1951 (68 y.o. M) Treating RN: Cornell Barman Primary Care Felicity Penix: Aura Dials Other Clinician: Referring Tova Vater: Aura Dials Treating Lanie Schelling/Extender: Melburn Hake, HOYT Weeks in Treatment: 0 Non-Wound Condition: Condition: Lymphedema Location: Leg Side: Bilateral Photos Notes Patient has redness and weeping on Left Lower Leg. Electronic Signature(s) Signed: 09/28/2019 3:15:39 PM By: Gretta Cool, BSN, RN, CWS, Kim RN, BSN Entered By: Gretta Cool, BSN, RN, CWS, Kim on 09/27/2019 10:08:49 [name] -------------------------------------------------------------------------------- Pain Assessment Details Patient Name: [name], [name]. Date of Service: 09/27/2019 9:45 AM Medical Record Number: [id number] Patient Account Number: [id number] Date of Birth/Sex: 1951-01-23 (68 y.o. M) Treating RN: Cornell Barman Primary Care Khani Paino: Aura Dials Other Clinician: Referring Alese Furniss: Aura Dials Treating Santosh Petter/Extender: Melburn Hake, HOYT Weeks in Treatment: 0 Active Problems Location of Pain Severity and Description of Pain Patient Has Paino No Site Locations Pain Management and Medication Current Pain Management: Goals for Pain Management Patient denies pain at this time. Electronic Signature(s) Signed: 09/28/2019 3:15:39 PM By: Gretta Cool, BSN, RN, CWS, Kim RN, BSN Entered By: Gretta Cool, BSN, RN, CWS, Kim on 09/27/2019 09:51:35 Avery, [name]  ([id number]) -------------------------------------------------------------------------------- Patient/Caregiver Education Details Patient Name: [name], [name]. Date of Service: 09/27/2019 9:45 AM Medical Record Number: [id number] Patient Account Number: [id number] Date of Birth/Gender: 1951-11-14 (68 y.o. M) Treating RN: Army Melia Primary Care Physician: Aura Dials Other Clinician: Referring Physician: Aura Dials Treating Physician/Extender: Melburn Hake, HOYT Weeks in Treatment: 0 Education Assessment Education Provided To: Patient Education Topics Provided Wound/Skin Impairment: Handouts: Caring for Your Ulcer Methods: Demonstration, Explain/Verbal Responses: State content correctly Electronic Signature(s) Signed: 09/27/2019 3:26:34 PM By: Army Melia Entered By: Army Melia on 09/27/2019 10:35:51 [name], [name] -------------------------------------------------------------------------------- Vitals Details Patient Name: [name]. Date of Service: 09/27/2019 9:45 AM Medical Record Number: [id number] Patient Account Number: [id number] Date of Birth/Sex: January 23, 1951 (68 y.o. M) Treating RN: Cornell Barman Primary Care Jahmire Ruffins: Aura Dials Other Clinician: Referring Vito Beg: Aura Dials Treating Naim Murtha/Extender: Melburn Hake, HOYT Weeks in Treatment: 0 Vital Signs Time Taken: 09:51 Temperature (F): 98.7 Height (in): 69 Pulse (bpm): 53 Source: Stated Respiratory Rate (breaths/min): 16 Weight (lbs): 295 Blood Pressure (mmHg): 135/53 Source: Measured Reference Range: 80 - 120 mg / dl Body Mass Index (BMI): 43.6 Electronic Signature(s) Signed: 09/28/2019 3:15:39 PM By: Gretta Cool, BSN, RN, CWS, Kim RN, BSN Entered By: Gretta Cool, BSN, RN, CWS, Kim on 09/27/2019 09:53:14

## 2019-09-28 NOTE — Progress Notes (Signed)
[name], [name] Visit Report for 09/27/2019 Abuse/Suicide Risk Screen Details Patient Name: [name], [name]. Date of Service: 09/27/2019 9:45 AM Medical Record Number: [id number] Patient Account Number: [id number] Date of Birth/Sex: 09/17/1951 (68 y.o. M) Treating RN: Cornell Barman Primary Care Yanna Leaks: Aura Dials Other Clinician: Referring Gage Weant: Aura Dials Treating Rumaldo Difatta/Extender: Melburn Hake, HOYT Weeks in Treatment: 0 Abuse/Suicide Risk Screen Items Answer ABUSE RISK SCREEN: Has anyone close to you tried to hurt or harm you recentlyo No Do you feel uncomfortable with anyone in your familyo No Has anyone forced you do things that you didnot want to doo No Electronic Signature(s) Signed: 09/28/2019 3:15:39 PM By: Gretta Cool, BSN, RN, CWS, Kim RN, BSN Entered By: Gretta Cool, BSN, RN, CWS, Kim on 09/27/2019 10:00:08 [name] -------------------------------------------------------------------------------- Activities of Daily Living Details Patient Name: [name], [name]. Date of Service: 09/27/2019 9:45 AM Medical Record Number: [id number] Patient Account Number: [id number] Date of Birth/Sex: October 21, 1951 (68 y.o. M) Treating RN: Cornell Barman Primary Care Presley Summerlin: Aura Dials Other Clinician: Referring Jaziel Bennett: Aura Dials Treating Corlene Sabia/Extender: Melburn Hake, HOYT Weeks in Treatment: 0 Activities of Daily Living Items Answer Activities of Daily Living (Please select one for each item) Drive Automobile Completely Able Take Medications Completely Able Use Telephone Completely Able Care for Appearance Completely Able Use Toilet Completely Able Bath / Shower Completely Able Dress Self Completely Able Feed Self Completely Able Walk Completely Able Get In / Out Bed Completely Able Housework Completely Able Prepare Meals Completely Able Handle Money Completely Able Shop for Self Completely Able Electronic Signature(s) Signed:  09/28/2019 3:15:39 PM By: Gretta Cool, BSN, RN, CWS, Kim RN, BSN Entered By: Gretta Cool, BSN, RN, CWS, Kim on 09/27/2019 10:00:25 [name] -------------------------------------------------------------------------------- Education Screening Details Patient Name: [name]. Date of Service: 09/27/2019 9:45 AM Medical Record Number: [id number] Patient Account Number: [id number] Date of Birth/Sex: 1951-01-17 (68 y.o. M) Treating RN: Cornell Barman Primary Care Lynnix Schoneman: Aura Dials Other Clinician: Referring Bryona Foxworthy: Aura Dials Treating Meloney Feld/Extender: Melburn Hake, HOYT Weeks in Treatment: 0 Primary Learner Assessed: Patient Learning Preferences/Education Level/Primary Language Learning Preference: Explanation Highest Education Level: College or Above Preferred Language: English Cognitive Barrier Language Barrier: No Translator Needed: No Memory Deficit: No Emotional Barrier: No Cultural/Religious Beliefs Affecting Medical Care: No Physical Barrier Impaired Vision: No Impaired Hearing: No Decreased Hand dexterity: No Knowledge/Comprehension Knowledge Level: High Comprehension Level: High Ability to understand written High instructions: Ability to understand verbal High instructions: Motivation Anxiety Level: Calm Cooperation: Cooperative Education Importance: Acknowledges Need Interest in Health Problems: Asks Questions Perception: Coherent Willingness to Engage in Self- High Management Activities: Readiness to Engage in Self- High Management Activities: Electronic Signature(s) Signed: 09/28/2019 3:15:39 PM By: Gretta Cool, BSN, RN, CWS, Kim RN, BSN Entered By: Gretta Cool, BSN, RN, CWS, Kim on 09/27/2019 10:01:01 [name] -------------------------------------------------------------------------------- Fall Risk Assessment Details Patient Name: [name]. Date of Service: 09/27/2019 9:45 AM Medical Record Number:  [id number] Patient Account Number: [id number] Date of Birth/Sex: 31-Dec-1950 (68 y.o. M) Treating RN: Cornell Barman Primary Care Ikey Omary: Aura Dials Other Clinician: Referring Roxsana Riding: Aura Dials Treating Estelene Carmack/Extender: Melburn Hake, HOYT Weeks in Treatment: 0 Fall Risk Assessment Items Have you had 2 or more falls in the last 12 monthso 0 No Have you had any fall that resulted in injury in the last 12 monthso 0 No FALLS RISK SCREEN History of falling - immediate or within 3 months 0 No Secondary diagnosis (Do you have 2 or more medical diagnoseso) 0 No Ambulatory aid  None/bed rest/wheelchair/nurse 0 Yes Crutches/cane/walker 0 No Furniture 0 No Intravenous therapy Access/Saline/Heparin Lock 0 No Gait/Transferring Normal/ bed rest/ wheelchair 0 Yes Weak (short steps with or without shuffle, stooped but able to lift head while 0 No walking, may seek support from furniture) Impaired (short steps with shuffle, may have difficulty arising from chair, head 0 No down, impaired balance) Mental Status Oriented to own ability 0 Yes Electronic Signature(s) Signed: 09/28/2019 3:15:39 PM By: Gretta Cool, BSN, RN, CWS, Kim RN, BSN Entered By: Gretta Cool, BSN, RN, CWS, Kim on 09/27/2019 10:01:28 [name] -------------------------------------------------------------------------------- Foot Assessment Details Patient Name: [name]. Date of Service: 09/27/2019 9:45 AM Medical Record Number: [id number] Patient Account Number: [id number] Date of Birth/Sex: 08-26-1951 (68 y.o. M) Treating RN: Cornell Barman Primary Care Kanesha Cadle: Aura Dials Other Clinician: Referring Sarely Stracener: Aura Dials Treating Nakenya Theall/Extender: Melburn Hake, HOYT Weeks in Treatment: 0 Foot Assessment Items Site Locations + = Sensation present, - = Sensation absent, C = Callus, U = Ulcer R = Redness, W = Warmth, M = Maceration, PU = Pre-ulcerative lesion F = Fissure, S = Swelling, D =  Dryness Assessment Right: Left: Other Deformity: No No Prior Foot Ulcer: No No Prior Amputation: No No Charcot Joint: No No Ambulatory Status: Ambulatory Without Help Gait: Steady Electronic Signature(s) Signed: 09/28/2019 3:15:39 PM By: Gretta Cool, BSN, RN, CWS, Kim RN, BSN Entered By: Gretta Cool, BSN, RN, CWS, Kim on 09/27/2019 10:03:16 Gravette, [name] ([id number]) -------------------------------------------------------------------------------- Nutrition Risk Screening Details Patient Name: [name]. Date of Service: 09/27/2019 9:45 AM Medical Record Number: [id number] Patient Account Number: [id number] Date of Birth/Sex: 10-08-1951 (68 y.o. M) Treating RN: Cornell Barman Primary Care Ronniesha Seibold: Aura Dials Other Clinician: Referring Harlei Lehrmann: Aura Dials Treating Tarvares Lant/Extender: Melburn Hake, HOYT Weeks in Treatment: 0 Height (in): 69 Weight (lbs): 295 Body Mass Index (BMI): 43.6 Nutrition Risk Screening Items Score Screening NUTRITION RISK SCREEN: I have an illness or condition that made me change the kind and/or amount of 0 No food I eat I eat fewer than two meals per day 0 No I eat few fruits and vegetables, or milk products 0 No I have three or more drinks of beer, liquor or wine almost every day 0 No I have tooth or mouth problems that make it hard for me to eat 0 No I don't always have enough money to buy the food I need 0 No I eat alone most of the time 0 No I take three or more different prescribed or over-the-counter drugs a day 1 Yes Without wanting to, I have lost or gained 10 pounds in the last six months 0 No I am not always physically able to shop, cook and/or feed myself 0 No Nutrition Protocols Good Risk Protocol 0 No interventions needed Moderate Risk Protocol High Risk Proctocol Risk Level: Good Risk Score: 1 Electronic Signature(s) Signed: 09/28/2019 3:15:39 PM By: Gretta Cool, BSN, RN, CWS, Kim RN, BSN Entered By: Gretta Cool, BSN, RN, CWS, Kim on 09/27/2019  10:02:04

## 2019-09-30 LAB — AEROBIC CULTURE W GRAM STAIN (SUPERFICIAL SPECIMEN): Gram Stain: NONE SEEN

## 2019-10-02 ENCOUNTER — Telehealth (HOSPITAL_COMMUNITY): Payer: Self-pay

## 2019-10-02 NOTE — Telephone Encounter (Signed)

## 2019-10-03 ENCOUNTER — Ambulatory Visit (HOSPITAL_COMMUNITY)
Admission: RE | Admit: 2019-10-03 | Discharge: 2019-10-03 | Disposition: A | Discharge status: Home / Self Care | Payer: Medicare Other | Source: Ambulatory Visit | Attending: Family | Admitting: Family

## 2019-10-03 ENCOUNTER — Other Ambulatory Visit: Payer: Self-pay

## 2019-10-03 DIAGNOSIS — L97929 Non-pressure chronic ulcer of unspecified part of left lower leg with unspecified severity: Secondary | ICD-10-CM

## 2019-10-04 ENCOUNTER — Encounter: Payer: Medicare Other | Admitting: Physician Assistant

## 2019-10-04 DIAGNOSIS — E11628 Type 2 diabetes mellitus with other skin complications: Secondary | ICD-10-CM | POA: Diagnosis not present

## 2019-10-04 NOTE — Progress Notes (Addendum)
[name] Visit Report for 10/04/2019 Chief Complaint Document Details Patient Name: [name], [name]. Date of Service: 10/04/2019 9:00 AM Medical Record Number: [id number] Patient Account Number: [id number] Date of Birth/Sex: 03/22/1951 (68 y.o. M) Treating RN: Army Melia Primary Care Provider: Aura Dials Other Clinician: Referring Provider: Aura Dials Treating Provider/Extender: Melburn Hake, HOYT Weeks in Treatment: 1 Information Obtained from: Patient Chief Complaint Bilateral LE lymphedema left leg cellulitis Electronic Signature(s) Signed: 10/04/2019 9:06:40 AM By: Worthy Keeler PA-C Entered By: Worthy Keeler on 10/04/2019 09:06:40 [name] -------------------------------------------------------------------------------- HPI Details Patient Name: [name]. Date of Service: 10/04/2019 9:00 AM Medical Record Number: [id number] Patient Account Number: [id number] Date of Birth/Sex: 02/27/1951 (68 y.o. M) Treating RN: Army Melia Primary Care Provider: Aura Dials Other Clinician: Referring Provider: Aura Dials Treating Provider/Extender: Melburn Hake, HOYT Weeks in Treatment: 1 History of Present Illness HPI Description: 09/27/2019 on evaluation today patient appears to be doing poorly in regard to his bilateral lower extremities he does have bilateral lower extremity lymphedema. With that being said he also has a history of diabetes type 2, hypertension, congestive heart failure, and apparently has chronic venous insufficiency which has led to the lymphedema. Currently he also shows signs of an infection of the left lower extremity which does have me concerned at this point as well. He tells me that he is not really having any significant pain but has had a lot of trouble with his legs in general. He does have a compression stocking he is wearing on the right lower extremity he was wrapped with an Unna boot on the  left lower extremity. The patient tells me that overall he has been doing with this for several weeks and home health has been initiated. He has not been on any antibiotics yet and he tells me that the home health nurse was not sure that it was infected based on what I see today I am concerned that this likely is infected at this point. Fortunately however there does not appear to be any evidence of systemic infection which is good news. The Unna boot does seem to be helping control the edema although he may benefit from a stronger compression wrap I think that at this point without having the ABIs completed the Unna boot is where I would stand. He is supposed to be having an arterial study with ABI as ordered by his cardiologist but he states he may have missed that appointment and has not called to reschedule he needs to contact them to get this rescheduled as soon as possible I explained to him. He tells me that he is draining a lot as far as the left lower extremity is concerned and that is something that he feels like is not lasting very long as far as the wraps are concerned when they put him on they seem to get wet extremely quickly. 10/04/2019 on evaluation today patient appears to be doing better compared to last week's evaluation. Fortunately there is no signs of active infection at this time. He has been tolerating the dressing changes without complication. He did undergo arterial studies yesterday and has an appointment tomorrow with the vascular specialist. With that being said his ABI on the left was 1.25 on the right 1.19 with a TBI of 0.61 on the left and 0.69 on the right. There was stated to be no evidence of obvious significant arterial disease and the patient had pretty much triphasic blood flow throughout. Overall  I am very pleased with how things seem to be progressing. We will see if the vascular doctor says anything different tomorrow but I even at this point I feel like he  would do well with a stronger compression wrap to try to get some the fluid out of his leg at this point. The good news is his drainage has slowed down considerably I do believe the antibiotics have been beneficial for him. Electronic Signature(s) Signed: 10/04/2019 9:32:53 AM By: Worthy Keeler PA-C Entered By: Worthy Keeler on 10/04/2019 09:32:52 [name] -------------------------------------------------------------------------------- Physical Exam Details Patient Name: [name]. Date of Service: 10/04/2019 9:00 AM Medical Record Number: [id number] Patient Account Number: [id number] Date of Birth/Sex: [birth year] (68 y.o. M) Treating RN: Army Melia Primary Care Provider: Aura Dials Other Clinician: Referring Provider: Aura Dials Treating Provider/Extender: STONE III, HOYT Weeks in Treatment: 1 Constitutional Obese and well-hydrated in no acute distress. Respiratory normal breathing without difficulty. clear to auscultation bilaterally. Cardiovascular regular rate and rhythm with normal S1, S2. Psychiatric this patient is able to make decisions and demonstrates good insight into disease process. Alert and Oriented x 3. pleasant and cooperative. Notes Patient's leg currently on the left shows actually some good epithelialization at multiple locations along the leg. There are still some open areas that are weeping and draining there is still some erythema but he does feel like the antibiotic has been beneficial there is also much less odor as compared to last week. Overall I am pretty pleased with how things seem to be going at this time. Unfortunately the culture did not show any specific organisms by which to direct therapy which is quite unfortunate but nonetheless since he seems to be doing better with the Cipro I would recommend that we probably continue with that as such. Electronic Signature(s) Signed: 10/04/2019 9:34:22 AM By: Worthy Keeler PA-C Entered By: Worthy Keeler on 10/04/2019 09:34:22 [name] -------------------------------------------------------------------------------- Physician Orders Details Patient Name: [name]. Date of Service: 10/04/2019 9:00 AM Medical Record Number: [id number] Patient Account Number: [id number] Date of Birth/Sex: [birth year] (68 y.o. M) Treating RN: Army Melia Primary Care Provider: Aura Dials Other Clinician: Referring Provider: Aura Dials Treating Provider/Extender: Melburn Hake, HOYT Weeks in Treatment: 1 Verbal / Phone Orders: No Diagnosis Coding ICD-10 Coding Code Description I89.0 Lymphedema, not elsewhere classified I87.2 Venous insufficiency (chronic) (peripheral) E11.628 Type 2 diabetes mellitus with other skin complications 99991111 Essential (primary) hypertension I50.42 Chronic combined systolic (congestive) and diastolic (congestive) heart failure L02.416 Cutaneous abscess of left lower limb Wound Cleansing o Clean wound with Normal Saline. - in office o Cleanse wound with mild soap and water - left leg Primary Wound Dressing o Silver Alginate - over weeping areas Secondary Dressing o ABD pad Dressing Change Frequency o Dressing is to be changed Monday and Thursday. - HH to change wrap twice a week until pt next visit in 2 weeks Follow-up Appointments o Return Appointment in 2 weeks. Edema Control o Other: - Unna to anchor [place] Visits - [place] Nurse may visit PRN to address patientos wound care needs. o FACE TO FACE ENCOUNTER: MEDICARE and MEDICAID PATIENTS: I certify that this patient is under my care and that I had a face-to-face encounter that meets the physician face-to-face encounter requirements with this patient on this date. The encounter with the patient was in whole or in part for the following MEDICAL CONDITION: (primary reason  for Home  Healthcare) MEDICAL NECESSITY: I certify, that based on my findings, NURSING services are a medically necessary home health service. HOME BOUND STATUS: I certify that my clinical findings support that this patient is homebound (i.e., Due to illness or injury, pt requires aid of supportive devices such as crutches, cane, wheelchairs, walkers, the use of special transportation or the assistance of another person to leave their place of residence. There is a normal inability to leave the home and doing so requires considerable and taxing effort. Other absences are for medical reasons / religious services and are infrequent or of short duration when for other reasons). o If current dressing causes regression in wound condition, may D/C ordered dressing product/s and apply Normal Saline Moist Dressing daily until next [place] / Other MD appointment. [place] of regression in wound condition at [phone]. o Please direct any NON-WOUND related issues/requests for orders to patient's Primary Care Physician FRANKIN, WILKINS ([id number]) Patient Medications Allergies: codeine Notifications Medication Indication Start End Cipro 10/04/2019 DOSE 1 - oral 500 mg tablet - 1 tablet oral taken 2 times a day for 5 additional days to add onto the current regimen Electronic Signature(s) Signed: 10/04/2019 9:37:37 AM By: Worthy Keeler PA-C Entered By: Worthy Keeler on 10/04/2019 09:37:36 Schiffman, Adrienne [name] ([id number]) -------------------------------------------------------------------------------- Problem List Details Patient Name: [name]. Date of Service: 10/04/2019 9:00 AM Medical Record Number: [id number] Patient Account Number: [id number] Date of Birth/Sex: May 24, 1951 (68 y.o. M) Treating RN: Army Melia Primary Care Provider: Aura Dials Other Clinician: Referring Provider: Aura Dials Treating Provider/Extender: Melburn Hake, HOYT Weeks in  Treatment: 1 Active Problems ICD-10 Evaluated Encounter Code Description Active Date Today Diagnosis I89.0 Lymphedema, not elsewhere classified 09/27/2019 No Yes I87.2 Venous insufficiency (chronic) (peripheral) 09/27/2019 No Yes E11.628 Type 2 diabetes mellitus with other skin complications 123XX123 No Yes I10 Essential (primary) hypertension 09/27/2019 No Yes I50.42 Chronic combined systolic (congestive) and diastolic 123XX123 No Yes (congestive) heart failure L02.416 Cutaneous abscess of left lower limb 09/27/2019 No Yes Inactive Problems Resolved Problems Electronic Signature(s) Signed: 10/04/2019 9:06:35 AM By: Worthy Keeler PA-C Entered By: Worthy Keeler on 10/04/2019 09:06:34 Stauffer, Adrienne [name] ([id number]) -------------------------------------------------------------------------------- Progress Note Details Patient Name: [name]. Date of Service: 10/04/2019 9:00 AM Medical Record Number: [id number] Patient Account Number: [id number] Date of Birth/Sex: 1951/11/10 (68 y.o. M) Treating RN: Army Melia Primary Care Provider: Aura Dials Other Clinician: Referring Provider: Aura Dials Treating Provider/Extender: Melburn Hake, HOYT Weeks in Treatment: 1 Subjective Chief Complaint Information obtained from Patient Bilateral LE lymphedema left leg cellulitis History of Present Illness (HPI) 09/27/2019 on evaluation today patient appears to be doing poorly in regard to his bilateral lower extremities he does have bilateral lower extremity lymphedema. With that being said he also has a history of diabetes type 2, hypertension, congestive heart failure, and apparently has chronic venous insufficiency which has led to the lymphedema. Currently he also shows signs of an infection of the left lower extremity which does have me concerned at this point as well. He tells me that he is not really having any significant pain but has had a lot of trouble with his legs in  general. He does have a compression stocking he is wearing on the right lower extremity he was wrapped with an Unna boot on the left lower extremity. The patient tells me that overall he has been doing with this for several weeks and home health has been initiated. He  has not been on any antibiotics yet and he tells me that the home health nurse was not sure that it was infected based on what I see today I am concerned that this likely is infected at this point. Fortunately however there does not appear to be any evidence of systemic infection which is good news. The Unna boot does seem to be helping control the edema although he may benefit from a stronger compression wrap I think that at this point without having the ABIs completed the Unna boot is where I would stand. He is supposed to be having an arterial study with ABI as ordered by his cardiologist but he states he may have missed that appointment and has not called to reschedule he needs to contact them to get this rescheduled as soon as possible I explained to him. He tells me that he is draining a lot as far as the left lower extremity is concerned and that is something that he feels like is not lasting very long as far as the wraps are concerned when they put him on they seem to get wet extremely quickly. 10/04/2019 on evaluation today patient appears to be doing better compared to last week's evaluation. Fortunately there is no signs of active infection at this time. He has been tolerating the dressing changes without complication. He did undergo arterial studies yesterday and has an appointment tomorrow with the vascular specialist. With that being said his ABI on the left was 1.25 on the right 1.19 with a TBI of 0.61 on the left and 0.69 on the right. There was stated to be no evidence of obvious significant arterial disease and the patient had pretty much triphasic blood flow throughout. Overall I am very pleased with how things seem  to be progressing. We will see if the vascular doctor says anything different tomorrow but I even at this point I feel like he would do well with a stronger compression wrap to try to get some the fluid out of his leg at this point. The good news is his drainage has slowed down considerably I do believe the antibiotics have been beneficial for him. Patient History Information obtained from Patient. Family History Cancer - Paternal Grandparents, Diabetes - Mother, No family history of Heart Disease, Hypertension, Kidney Disease, Lung Disease, Seizures, Stroke, Thyroid Problems, Tuberculosis. Social History Former smoker - 81 years ago, Marital Status - Married, Alcohol Use - Rarely, Drug Use - No History, Caffeine Use - Daily. Medical History Cardiovascular Patient has history of Congestive Heart Failure [name], [name] Endocrine Patient has history of Type II Diabetes Musculoskeletal Patient has history of Osteoarthritis - Hands Review of Systems (ROS) Constitutional Symptoms (General Health) Denies complaints or symptoms of Fatigue, Fever, Chills, Marked Weight Change. Respiratory Denies complaints or symptoms of Chronic or frequent coughs, Shortness of Breath. Cardiovascular Denies complaints or symptoms of Chest pain, LE edema. Psychiatric Denies complaints or symptoms of Anxiety, Claustrophobia. Objective Constitutional Obese and well-hydrated in no acute distress. Vitals Time Taken: 9:02 AM, Height: 69 in, Weight: 295 lbs, BMI: 43.6, Temperature: 99.1 F, Pulse: 93 bpm, Respiratory Rate: 16 breaths/min, Blood Pressure: 153/88 mmHg. Respiratory normal breathing without difficulty. clear to auscultation bilaterally. Cardiovascular regular rate and rhythm with normal S1, S2. Psychiatric this patient is able to make decisions and demonstrates good insight into disease process. Alert and Oriented x 3. pleasant and cooperative. General Notes: Patient's leg  currently on the left shows actually some good epithelialization at multiple locations  along the leg. There are still some open areas that are weeping and draining there is still some erythema but he does feel like the antibiotic has been beneficial there is also much less odor as compared to last week. Overall I am pretty pleased with how things seem to be going at this time. Unfortunately the culture did not show any specific organisms by which to direct therapy which is quite unfortunate but nonetheless since he seems to be doing better with the Cipro I would recommend that we probably continue with that as such. Other Condition(s) Patient presents with Lymphedema located on the Bilateral Leg. Assessment [name], [name] Active Problems ICD-10 Lymphedema, not elsewhere classified Venous insufficiency (chronic) (peripheral) Type 2 diabetes mellitus with other skin complications Essential (primary) hypertension Chronic combined systolic (congestive) and diastolic (congestive) heart failure Cutaneous abscess of left lower limb Procedures There was a Four Layer Compression Therapy Procedure with a pre-treatment ABI of 1.2 by Army Melia, RN. Post procedure Diagnosis Wound #: Same as Pre-Procedure Notes: 4 Layer applied to Left leg only. Plan Wound Cleansing: Clean wound with Normal Saline. - in office Cleanse wound with mild soap and water - left leg Primary Wound Dressing: Silver Alginate - over weeping areas Secondary Dressing: ABD pad Dressing Change Frequency: Dressing is to be changed Monday and Thursday. - HH to change wrap twice a week until pt next visit in 2 weeks Follow-up Appointments: Return Appointment in 2 weeks. Edema Control: Other: - Unna to anchor wrap Home Health: [place] Nurse may visit PRN to address patient s wound care needs. FACE TO FACE ENCOUNTER: MEDICARE and MEDICAID PATIENTS: I certify that this  patient is under my care and that I had a face-to-face encounter that meets the physician face-to-face encounter requirements with this patient on this date. The encounter with the patient was in whole or in part for the following MEDICAL CONDITION: (primary reason for [place]) MEDICAL NECESSITY: I certify, that based on my findings, NURSING services are a medically necessary home health service. HOME BOUND STATUS: I certify that my clinical findings support that this patient is homebound (i.e., Due to illness or injury, pt requires aid of supportive devices such as crutches, cane, wheelchairs, walkers, the use of special transportation or the assistance of another person to leave their place of residence. There is a normal inability to leave the home and doing so requires considerable and taxing effort. Other absences are for medical reasons / religious services and are infrequent or of short duration when for other reasons). If current dressing causes regression in wound condition, may D/C ordered dressing product/s and apply Normal Saline Moist Dressing daily until next [place] / Other MD appointment. [place] of regression in wound condition at [phone]. Please direct any NON-WOUND related issues/requests for orders to patient's Primary Care Physician [name], [name] The following medication(s) was prescribed: Cipro oral 500 mg tablet 1 1 tablet oral taken 2 times a day for 5 additional days to add onto the current regimen starting 10/04/2019 1. I would recommend currently that we go ahead and initiate treatment with a silver alginate dressing specifically silver cell for the lower extremities to help with fluid management at this point. We will use ABD pads over top of this. 2. Based on his arterial studies I am also get a go ahead and recommend that we increase the compression wrap to a 4 layer compression wrap  at this point to try to  help with getting more fluid out of the leg which I think overall will help with healing as well. 3. I am also going to suggest that we continue to have home health come to change out the dressings in fact next week will have them come twice since he will not be here in the clinic during that time. He is in agreement with that plan. 4. I am also going to suggest that we go ahead and extend the Cipro for 5 additional days to make a total of 2 weeks of treatment at this point. He has done very well with this and has not noticed any adverse events or effects at this point. We will see patient back for reevaluation in 2 weeks here in the clinic. If anything worsens or changes patient will contact our office for additional recommendations. Electronic Signature(s) Signed: 10/04/2019 9:38:04 AM By: Worthy Keeler PA-C Previous Signature: 10/04/2019 9:35:18 AM Version By: Worthy Keeler PA-C Entered By: Worthy Keeler on 10/04/2019 09:38:04 Stenerson, Adrienne [name] ([id number]) -------------------------------------------------------------------------------- ROS/PFSH Details Patient Name: [name]. Date of Service: 10/04/2019 9:00 AM Medical Record Number: [id number] Patient Account Number: [id number] Date of Birth/Sex: 09-Jun-1951 (68 y.o. M) Treating RN: Army Melia Primary Care Provider: Aura Dials Other Clinician: Referring Provider: Aura Dials Treating Provider/Extender: Melburn Hake, HOYT Weeks in Treatment: 1 Information Obtained From Patient Constitutional Symptoms (General Health) Complaints and Symptoms: Negative for: Fatigue; Fever; Chills; Marked Weight Change Respiratory Complaints and Symptoms: Negative for: Chronic or frequent coughs; Shortness of Breath Cardiovascular Complaints and Symptoms: Negative for: Chest pain; LE edema Medical History: Positive for: Congestive Heart Failure Psychiatric Complaints and Symptoms: Negative for: Anxiety;  Claustrophobia Endocrine Medical History: Positive for: Type II Diabetes Time with diabetes: 1 year Treated with: Oral agents Blood sugar tested every day: No Musculoskeletal Medical History: Positive for: Osteoarthritis - Hands Immunizations Pneumococcal Vaccine: Received Pneumococcal Vaccination: No Implantable Devices None Family and Social History LAMOYNE, DELIN ([id number]) Cancer: Yes - Paternal Grandparents; Diabetes: Yes - Mother; Heart Disease: No; Hypertension: No; Kidney Disease: No; Lung Disease: No; Seizures: No; Stroke: No; Thyroid Problems: No; Tuberculosis: No; Former smoker - 51 years ago; Marital Status - Married; Alcohol Use: Rarely; Drug Use: No History; Caffeine Use: Daily Physician Affirmation I have reviewed and agree with the above information. Electronic Signature(s) Signed: 10/05/2019 8:45:19 AM By: Army Melia Signed: 10/05/2019 1:53:04 PM By: Worthy Keeler PA-C Entered By: Worthy Keeler on 10/04/2019 09:34:00 Daigler, Adrienne [name] ([id number]) -------------------------------------------------------------------------------- SuperBill Details Patient Name: [name]. Date of Service: 10/04/2019 Medical Record Number: [id number] Patient Account Number: [id number] Date of Birth/Sex: [birth year] (68 y.o. M) Treating RN: Army Melia Primary Care Provider: Aura Dials Other Clinician: Referring Provider: Aura Dials Treating Provider/Extender: Melburn Hake, HOYT Weeks in Treatment: 1 Diagnosis Coding ICD-10 Codes Code Description I89.0 Lymphedema, not elsewhere classified I87.2 Venous insufficiency (chronic) (peripheral) E11.628 Type 2 diabetes mellitus with other skin complications 99991111 Essential (primary) hypertension I50.42 Chronic combined systolic (congestive) and diastolic (congestive) heart failure L02.416 Cutaneous abscess of left lower limb Facility Procedures CPT4 Code: [id number] Description: (Facility Use Only) [id number] - APPLY  MULTLAY COMPRS LWR LT LEG Modifier: Quantity: 1 Physician Procedures CPT4 [id number] Description: 99214 - WC PHYS LEVEL 4 - EST PT ICD-10 Diagnosis Description I89.0 Lymphedema, not elsewhere classified I87.2 Venous insufficiency (chronic) (peripheral) E11.628 Type 2 diabetes mellitus with other skin complications 99991111 Essential (primary)  hypertension Modifier: Quantity: 1 Electronic Signature(s)  Signed: 10/04/2019 9:38:16 AM By: Worthy Keeler PA-C Entered By: Worthy Keeler on 10/04/2019 09:38:15

## 2019-10-05 ENCOUNTER — Encounter: Payer: Self-pay | Admitting: Vascular Surgery

## 2019-10-05 ENCOUNTER — Ambulatory Visit (INDEPENDENT_AMBULATORY_CARE_PROVIDER_SITE_OTHER): Payer: Medicare Other | Admitting: Vascular Surgery

## 2019-10-05 ENCOUNTER — Other Ambulatory Visit: Payer: Self-pay

## 2019-10-05 VITALS — BP 136/76 | HR 91 | Temp 98.4°F | Resp 20 | Ht 69.0 in | Wt 304.0 lb

## 2019-10-05 DIAGNOSIS — I872 Venous insufficiency (chronic) (peripheral): Secondary | ICD-10-CM | POA: Diagnosis not present

## 2019-10-05 NOTE — Progress Notes (Signed)
PRINCE, CASTIGLIONI ([id number]) Visit Report for 10/04/2019 Arrival Information Details Patient Name: [name], [name]. Date of Service: 10/04/2019 9:00 AM Medical Record Number: [id number] Patient Account Number: [id number] Date of Birth/Sex: January 16, 1951 (68 y.o. M) Treating RN: Montey Hora Primary Care Barbi Kumagai: Aura Dials Other Clinician: Referring Nemiah Bubar: Aura Dials Treating Caidance Sybert/Extender: Melburn Hake, HOYT Weeks in Treatment: 1 Visit Information History Since Last Visit Added or deleted any medications: No Patient Arrived: Ambulatory Any new allergies or adverse reactions: No Arrival Time: 09:02 Had a fall or experienced change in No Accompanied By: self activities of daily living that may affect Transfer Assistance: None risk of falls: Patient Identification Verified: Yes Signs or symptoms of abuse/neglect since last visito No Secondary Verification Process Yes Hospitalized since last visit: No Completed: Implantable device outside of the clinic excluding No Patient Requires Transmission-Based No cellular tissue based products placed in the center Precautions: since last visit: Patient Has Alerts: Yes Has Dressing in Place as Prescribed: Yes Patient Alerts: DM II Has Compression in Place as Prescribed: Yes ABI11/18/20 L 1.25 R Pain Present Now: No 1.19 Electronic Signature(s) Signed: 10/05/2019 8:45:19 AM By: Army Melia Entered By: Army Melia on 10/04/2019 09:25:59 [name], [name] -------------------------------------------------------------------------------- Compression Therapy Details Patient Name: [name]. Date of Service: 10/04/2019 9:00 AM Medical Record Number: [id number] Patient Account Number: [id number] Date of Birth/Sex: [birth year] (68 y.o. M) Treating RN: Army Melia Primary Care Kelwin Gibler: Aura Dials Other Clinician: Referring Jessica Seidman: Aura Dials Treating Tyler Cubit/Extender: Melburn Hake, HOYT Weeks in  Treatment: 1 Compression Therapy Performed for Wound Assessment: NonWound Condition Lymphedema - Bilateral Leg Performed By: Clinician Army Melia, RN Compression Type: Four Layer Pre Treatment ABI: 1.2 Post Procedure Diagnosis Same as Pre-procedure Notes 4 Layer applied to Left leg only Electronic Signature(s) Signed: 10/05/2019 8:45:19 AM By: Army Melia Entered By: Army Melia on 10/04/2019 09:27:27 Chowning, [name] -------------------------------------------------------------------------------- Encounter Discharge Information Details Patient Name: [name]. Date of Service: 10/04/2019 9:00 AM Medical Record Number: [id number] Patient Account Number: [id number] Date of Birth/Sex: Sep 25, 1951 (68 y.o. M) Treating RN: Army Melia Primary Care Fermin Yan: Aura Dials Other Clinician: Referring Shresta Risden: Aura Dials Treating Kennesha Brewbaker/Extender: Melburn Hake, HOYT Weeks in Treatment: 1 Encounter Discharge Information Items Discharge Condition: Stable Ambulatory Status: Ambulatory Discharge Destination: Home Transportation: Private Auto Accompanied By: self Schedule Follow-up Appointment: Yes Clinical Summary of Care: Electronic Signature(s) Signed: 10/05/2019 8:45:19 AM By: Army Melia Entered By: Army Melia on 10/04/2019 09:32:14 [name], [name] -------------------------------------------------------------------------------- Lower Extremity Assessment Details Patient Name: [name]. Date of Service: 10/04/2019 9:00 AM Medical Record Number: [id number] Patient Account Number: [id number] Date of Birth/Sex: 1951-06-10 (68 y.o. M) Treating RN: Montey Hora Primary Care Franklyn Cafaro: Aura Dials Other Clinician: Referring Dorance Spink: Aura Dials Treating Reece Fehnel/Extender: STONE III, HOYT Weeks in Treatment: 1 Edema Assessment Assessed: [Left: No] [Right: No] Edema: [Left: Ye] [Right: s] Calf Left: Right: Point of  Measurement: 36 cm From Medial Instep 50 cm cm Ankle Left: Right: Point of Measurement: 12 cm From Medial Instep 34 cm cm Vascular Assessment Pulses: Dorsalis Pedis Palpable: [Left:Yes] Electronic Signature(s) Signed: 10/04/2019 4:34:34 PM By: Montey Hora Entered By: Montey Hora on 10/04/2019 09:09:31 [name], [name] -------------------------------------------------------------------------------- Multi Wound Chart Details Patient Name: [name]. Date of Service: 10/04/2019 9:00 AM Medical Record Number: [id number] Patient Account Number: [id number] Date of Birth/Sex: 10-05-1951 (68 y.o. M) Treating RN: Army Melia Primary Care Tawnie Ehresman: Aura Dials Other Clinician: Referring Revis Whalin: Aura Dials Treating Jedd Schulenburg/Extender: Melburn Hake, HOYT Weeks  in Treatment: 1 Vital Signs Height(in): 69 Pulse(bpm): 93 Weight(lbs): 295 Blood Pressure(mmHg): 153/88 Body Mass Index(BMI): 44 Temperature(F): 99.1 Respiratory Rate 16 (breaths/min): Wound Assessments Treatment Notes Electronic Signature(s) Signed: 10/05/2019 8:45:19 AM By: Army Melia Entered By: Army Melia on 10/04/2019 09:22:54 [name] -------------------------------------------------------------------------------- [place] Details Patient Name: [name], [name]. Date of Service: 10/04/2019 9:00 AM Medical Record Number: [id number] Patient Account Number: [id number] Date of Birth/Sex: [birth year] (68 y.o. M) Treating RN: Army Melia Primary Care Hasheem Voland: Aura Dials Other Clinician: Referring Countess Biebel: Aura Dials Treating Mattheo Swindle/Extender: Melburn Hake, HOYT Weeks in Treatment: 1 Active Inactive Nutrition Nursing Diagnoses: Impaired glucose control: actual or potential Goals: Patient/caregiver verbalizes understanding of need to maintain therapeutic glucose control per primary care physician Date Initiated: 09/27/2019 Target  Resolution Date: 10/19/2019 Goal Status: Active Interventions: Assess HgA1c results as ordered upon admission and as needed Notes: Orientation to the Wound Care Program Nursing Diagnoses: Knowledge deficit related to the wound healing center program Goals: Patient/caregiver will verbalize understanding of the [place] Date Initiated: 09/27/2019 Target Resolution Date: 10/19/2019 Goal Status: Active Interventions: Provide education on orientation to the wound center Notes: Wound/Skin Impairment Nursing Diagnoses: Impaired tissue integrity Goals: Ulcer/skin breakdown will have a volume reduction of 30% by week 4 Date Initiated: 09/27/2019 Target Resolution Date: 10/25/2019 Goal Status: Active Interventions: Assess ulceration(s) every visit [name], [name] Notes: Electronic Signature(s) Signed: 10/05/2019 8:45:19 AM By: Army Melia Entered By: Army Melia on 10/04/2019 09:22:47 [name], [name] ([id number]) -------------------------------------------------------------------------------- Non-Wound Condition Assessment Details Patient Name: [name]. Date of Service: 10/04/2019 9:00 AM Medical Record Number: [id number] Patient Account Number: [id number] Date of Birth/Sex: 1951-08-16 (68 y.o. M) Treating RN: Montey Hora Primary Care Laniqua Torrens: Aura Dials Other Clinician: Referring Cylee Dattilo: Aura Dials Treating Cortlandt Capuano/Extender: STONE III, HOYT Weeks in Treatment: 1 Non-Wound Condition: Condition: Lymphedema Location: Leg Side: Bilateral Photos Electronic Signature(s) Signed: 10/04/2019 9:14:21 AM By: Montey Hora Entered By: Montey Hora on 10/04/2019 09:14:21 [name], [name] -------------------------------------------------------------------------------- Pain Assessment Details Patient Name: [name]. Date of Service: 10/04/2019 9:00 AM Medical Record Number: [id number] Patient Account  Number: [id number] Date of Birth/Sex: November 08, 1951 (68 y.o. M) Treating RN: Montey Hora Primary Care Maddox Hlavaty: Aura Dials Other Clinician: Referring Quashawn Jewkes: Aura Dials Treating Zanaria Morell/Extender: Melburn Hake, HOYT Weeks in Treatment: 1 Active Problems Location of Pain Severity and Description of Pain Patient Has Paino No Site Locations Pain Management and Medication Current Pain Management: Electronic Signature(s) Signed: 10/04/2019 4:34:34 PM By: Montey Hora Entered By: Montey Hora on 10/04/2019 09:02:37 [name], [name] -------------------------------------------------------------------------------- Patient/Caregiver Education Details Patient Name: [name]. Date of Service: 10/04/2019 9:00 AM Medical Record Number: [id number] Patient Account Number: [id number] Date of Birth/Gender: Aug 25, 1951 (69 y.o. M) Treating RN: Army Melia Primary Care Physician: Aura Dials Other Clinician: Referring Physician: Aura Dials Treating Physician/Extender: Sharalyn Ink in Treatment: 1 Education Assessment Education Provided To: Patient Education Topics Provided Wound/Skin Impairment: Handouts: Caring for Your Ulcer Methods: Demonstration, Explain/Verbal Responses: State content correctly Electronic Signature(s) Signed: 10/05/2019 8:45:19 AM By: Army Melia Entered By: Army Melia on 10/04/2019 09:31:24 [name], [name] -------------------------------------------------------------------------------- Vitals Details Patient Name: [name]. Date of Service: 10/04/2019 9:00 AM Medical Record Number: [id number] Patient Account Number: [id number] Date of Birth/Sex: 26-Oct-1951 (68 y.o. M) Treating RN: Montey Hora Primary Care Kaedin Hicklin: Aura Dials Other Clinician: Referring Tria Noguera: Aura Dials Treating Myriam Brandhorst/Extender: STONE III, HOYT Weeks in Treatment: 1 Vital Signs Time Taken: 09:02 Temperature  (F):  99.1 Height (in): 69 Pulse (bpm): 93 Weight (lbs): 295 Respiratory Rate (breaths/min): 16 Body Mass Index (BMI): 43.6 Blood Pressure (mmHg): 153/88 Reference Range: 80 - 120 mg / dl Electronic Signature(s) Signed: 10/04/2019 4:34:34 PM By: Montey Hora Entered By: Montey Hora on 10/04/2019 09:03:18

## 2019-10-05 NOTE — Progress Notes (Signed)
Patient ID: [name]., male   DOB: 1951/08/07, 68 y.o.   MRN: [id number]  Reason for Consult: No chief complaint on file.   Referred by Keith Dials, MD  Subjective:     HPI:  [name] is a 68 y.o. male with left lower extremity wound for which she has been seeing the wound care center in [place].  He states that he has been in compression for at least 3 months.  He is now wearing a modified Unna boot daily.  He has never had blood clot or venous intervention in his bilateral lower extremities.  He is also wearing compression stockings of the right leg does not have any ulceration there.  Has been treated with antibiotics for cellulitis of the left lower extremity.  ABIs were performed prior to today's visit.  Past Medical History:  Diagnosis Date  . Arthritis   . Back pain    occasionally  . BPH (benign prostatic hypertrophy)    takes Uroxatral daily as well as Finasteride   . Diabetes mellitus without complication ([place])    takes Metformin daily  . GERD (gastroesophageal reflux disease)    no meds  . Hepatitis hx of   at age 89  . Hypertension    takes Losartan and HCTZ daily  . Joint pain   . Urinary frequency   . Urinary urgency    Family History  Problem Relation Age of Onset  . Benign prostatic hyperplasia Father   . Kidney cancer Neg Hx   . Kidney disease Neg Hx    Past Surgical History:  Procedure Laterality Date  . APPENDECTOMY     age 38  . COLONOSCOPY    . KNEE ARTHROSCOPY  1984   right and left   . SHOULDER ARTHROSCOPY  2003   right RCR-GSC-stayed overnight-had SOB  . SHOULDER ARTHROSCOPY WITH ROTATOR CUFF REPAIR AND SUBACROMIAL DECOMPRESSION Left 03/13/2013   Procedure: LEFT SHOULDER ARTHROSCOPY WITH SUBACROMIAL DECOMPRESSION, DISTAL CLAVICLE RESECTION AND REPAIR ROTATOR CUFF AND LABRAL DEBRIDEMENT;  Surgeon: [name]., MD;  Location: [facility];  Service: Orthopedics;  Laterality: Left;  . TOTAL HIP  ARTHROPLASTY Right 01/29/2015   Procedure: TOTAL HIP ARTHROPLASTY ANTERIOR APPROACH;  Surgeon: [name], MD;  Location: [facility];  Service: Orthopedics;  Laterality: Right;  Marland Kitchen VASECTOMY    . [place]    Short Social History:  Social History   Tobacco Use  . Smoking status: Never Smoker  . Smokeless tobacco: Former Network engineer Use Topics  . Alcohol use: Yes    Alcohol/week: 0.0 standard drinks    Comment: occasionally beer or scotch    Allergies  Allergen Reactions  . Codeine Swelling    Water retention    Current Outpatient Medications  Medication Sig Dispense Refill  . Cholecalciferol (VITAMIN D3) 5000 UNITS TABS Take 1 tablet by mouth daily. Reported on 04/29/2016    . ciprofloxacin (CIPRO) 500 MG tablet Take 500 mg by mouth 2 (two) times daily.    . finasteride (PROSCAR) 5 MG tablet Take 5 mg by mouth daily.    Marland Kitchen GABAPENTIN PO Take by mouth as needed.    Marland Kitchen glipiZIDE (GLUCOTROL XL) 5 MG 24 hr tablet Take 5 mg by mouth 3 (three) times daily with meals.    . metoprolol succinate (TOPROL-XL) 50 MG 24 hr tablet TAKE 1 TABLET BY MOUTH  DAILY WITH OR IMMEDIATLEY  FOLLOWING A MEAL 90 tablet  2  . potassium chloride SA (KLOR-CON) 20 MEQ tablet Take 1 tablet by mouth daily 90 tablet 1  . torsemide (DEMADEX) 20 MG tablet Take 2 tablets (40 mg total) by mouth daily. 180 tablet 3   No current facility-administered medications for this visit.     Review of Systems  Constitutional:  Constitutional negative. HENT: HENT negative.  Eyes: Eyes negative.  Cardiovascular: Positive for leg swelling.  GI: Gastrointestinal negative.  Musculoskeletal: Musculoskeletal negative.  Skin: Positive for rash.  Neurological: Neurological negative. Hematologic: Hematologic/lymphatic negative.  Psychiatric: Psychiatric negative.        Objective:  Objective   Vitals:   10/05/19 1043  BP: 136/76  Pulse: 91  Resp: 20  Temp: 98.4 F (36.9 C)  SpO2: 98%  Weight: (!) 304  lb (137.9 kg)  Height: 5\' 9"  (1.753 m)   Body mass index is 44.89 kg/m.  Physical Exam Constitutional:      Appearance: He is obese.  HENT:     Head: Normocephalic.     Nose: Nose normal.  Eyes:     Pupils: Pupils are equal, round, and reactive to light.  Cardiovascular:     Pulses:          Popliteal pulses are 2+ on the right side and 2+ on the left side.       Dorsalis pedis pulses are 2+ on the right side and 2+ on the left side.  Pulmonary:     Effort: Pulmonary effort is normal.  Abdominal:     General: Abdomen is flat.     Palpations: Abdomen is soft.  Musculoskeletal:     Right lower leg: Edema present.     Comments: Left ankle has significant venous stasis skin changes  Skin:    Capillary Refill: Capillary refill takes less than 2 seconds.     Findings: Rash present.  Neurological:     General: No focal deficit present.     Mental Status: He is alert.  Psychiatric:        Mood and Affect: Mood normal.        Behavior: Behavior normal.        Thought Content: Thought content normal.        Judgment: Judgment normal.     Data: ABIs performed 2 days ago demonstrate triphasic waveforms on the right 1.19 and on the left triphasic waveforms with 1.25 relative to the DP     Assessment/Plan:    68 year old male with what appears to be at least C4 venous disease although swelling may be multifactorial given obesity and reduced ejection fraction from previous echocardiogram.  ABIs are normal.  He will continue Unna boot on the left leg.  He has documented compression stockings going back greater than 3 months.  We will get him to follow-up with venous reflux studies.  I discussed with him the possible outcomes and possible need for intervention of his superficial venous reflux if that exist.  He demonstrates good understanding.  His leg was rewrapped with Unna boot today he will follow-up in a few weeks with reflux studies of the left lower extremity.      Waynetta Sandy MD [facility]

## 2019-10-18 ENCOUNTER — Encounter: Payer: Medicare Other | Attending: Physician Assistant | Admitting: Physician Assistant

## 2019-10-18 ENCOUNTER — Other Ambulatory Visit: Payer: Self-pay

## 2019-10-18 DIAGNOSIS — L03116 Cellulitis of left lower limb: Secondary | ICD-10-CM | POA: Insufficient documentation

## 2019-10-18 DIAGNOSIS — I872 Venous insufficiency (chronic) (peripheral): Secondary | ICD-10-CM | POA: Insufficient documentation

## 2019-10-18 DIAGNOSIS — L02416 Cutaneous abscess of left lower limb: Secondary | ICD-10-CM | POA: Diagnosis not present

## 2019-10-18 DIAGNOSIS — Z87891 Personal history of nicotine dependence: Secondary | ICD-10-CM | POA: Diagnosis not present

## 2019-10-18 DIAGNOSIS — E11628 Type 2 diabetes mellitus with other skin complications: Secondary | ICD-10-CM | POA: Insufficient documentation

## 2019-10-18 DIAGNOSIS — I110 Hypertensive heart disease with heart failure: Secondary | ICD-10-CM | POA: Diagnosis not present

## 2019-10-18 DIAGNOSIS — I5042 Chronic combined systolic (congestive) and diastolic (congestive) heart failure: Secondary | ICD-10-CM | POA: Diagnosis not present

## 2019-10-18 DIAGNOSIS — M19041 Primary osteoarthritis, right hand: Secondary | ICD-10-CM | POA: Insufficient documentation

## 2019-10-18 DIAGNOSIS — M19042 Primary osteoarthritis, left hand: Secondary | ICD-10-CM | POA: Diagnosis not present

## 2019-10-18 DIAGNOSIS — I890 Lymphedema, not elsewhere classified: Secondary | ICD-10-CM | POA: Diagnosis not present

## 2019-10-18 NOTE — Progress Notes (Signed)
[name], [name] Visit Report for 10/18/2019 Arrival Information Details Patient Name: [name], [name]. Date of Service: 10/18/2019 9:00 AM Medical Record Number: [id number] Patient Account Number: [id number] Date of Birth/Sex: 10/19/1951 (68 y.o. M) Treating RN: Harold Barban Primary Care [place] Cookson: Aura Dials Other Clinician: Referring Bodi Palmeri: Aura Dials Treating Rivky Clendenning/Extender: Melburn Hake, HOYT Weeks in Treatment: 3 Visit Information History Since Last Visit Added or deleted any medications: No Patient Arrived: Ambulatory Any new allergies or adverse reactions: No Arrival Time: 09:10 Had a fall or experienced change in No Accompanied By: self activities of daily living that may affect Transfer Assistance: None risk of falls: Patient Identification Verified: Yes Signs or symptoms of abuse/neglect since last visito No Secondary Verification Process Yes Hospitalized since last visit: No Completed: Has Dressing in Place as Prescribed: Yes Patient Requires Transmission-Based No Has Compression in Place as Prescribed: Yes Precautions: Pain Present Now: No Patient Has Alerts: Yes Patient Alerts: DM II ABI11/18/20 L 1.25 R 1.19 Electronic Signature(s) Signed: 10/18/2019 4:50:58 PM By: Harold Barban Entered By: Harold Barban on 10/18/2019 09:12:08 Ellett, [name] ([id number]) -------------------------------------------------------------------------------- Compression Therapy Details Patient Name: [name]. Date of Service: 10/18/2019 9:00 AM Medical Record Number: [id number] Patient Account Number: [id number] Date of Birth/Sex: 02-26-1951 (68 y.o. M) Treating RN: Army Melia Primary Care Amilia Vandenbrink: Aura Dials Other Clinician: Referring Shawna Kiener: Aura Dials Treating Tonae Livolsi/Extender: Melburn Hake, HOYT Weeks in Treatment: 3 Compression Therapy Performed for Wound Assessment: NonWound Condition Lymphedema - Bilateral Leg Performed  By: Clinician Army Melia, RN Compression Type: Four Layer Pre Treatment ABI: 1.2 Post Procedure Diagnosis Same as Pre-procedure Electronic Signature(s) Signed: 10/18/2019 12:37:56 PM By: Army Melia Entered By: Army Melia on 10/18/2019 09:36:03 Storbeck, [name] ([id number]) -------------------------------------------------------------------------------- Encounter Discharge Information Details Patient Name: [name], [name]. Date of Service: 10/18/2019 9:00 AM Medical Record Number: [id number] Patient Account Number: [id number] Date of Birth/Sex: May 09, 1951 (68 y.o. M) Treating RN: Army Melia Primary Care Valissa Lyvers: Aura Dials Other Clinician: Referring Walta Bell[place]: Aura Dials Treating Damoni Erker/Extender: Melburn Hake, HOYT Weeks in Treatment: 3 Encounter Discharge Information Items Discharge Condition: Stable Ambulatory Status: Ambulatory Discharge Destination: Home Transportation: Private Auto Accompanied By: self Schedule Follow-up Appointment: Yes Clinical Summary of Care: Electronic Signature(s) Signed: 10/18/2019 12:37:56 PM By: Army Melia Entered By: Army Melia on 10/18/2019 09:38:46 Shieh, [name] ([id number]) -------------------------------------------------------------------------------- Lower Extremity Assessment Details Patient Name: [name]. Date of Service: 10/18/2019 9:00 AM Medical Record Number: [id number] Patient Account Number: [id number] Date of Birth/Sex: 13-Mar-1951 (68 y.o. M) Treating RN: Harold Barban Primary Care Clever Geraldo: Aura Dials Other Clinician: Referring Adelayde Minney: Aura Dials Treating Brenly Trawick/Extender: Melburn Hake, HOYT Weeks in Treatment: 3 Edema Assessment Assessed: [Left: No] [Right: No] [Left: Edema] [Right: :] Calf Left: Right: Point of Measurement: 36 cm From Medial Instep 49.8 cm cm Ankle Left: Right: Point of Measurement: 12 cm From Medial Instep 33 cm cm Vascular Assessment Pulses: Dorsalis  Pedis Palpable: [Left:Yes] Posterior Tibial Palpable: [Left:Yes] Electronic Signature(s) Signed: 10/18/2019 4:50:58 PM By: Harold Barban Entered By: Harold Barban on 10/18/2019 09:16:49 Goettel, Capone M. ([id number]) -------------------------------------------------------------------------------- Multi Wound Chart Details Patient Name: [name]. Date of Service: 10/18/2019 9:00 AM Medical Record Number: [id number] Patient Account Number: [id number] Date of Birth/Sex: Aug 09, 1951 (68 y.o. M) Treating RN: Army Melia Primary Care Verla Bryngelson: Aura Dials Other Clinician: Referring Makinze Jani: Aura Dials Treating Pearly Bartosik/Extender: Melburn Hake, HOYT Weeks in Treatment: 3 Vital Signs Height(in): 69 Pulse(bpm): 88 Weight(lbs): 295 Blood Pressure(mmHg): 154/71 Body Mass Index(BMI): 44 Temperature(F): 98.3 Respiratory Rate 18 (breaths/min):  Wound Assessments Treatment Notes Electronic Signature(s) Signed: 10/18/2019 12:37:56 PM By: Army Melia Entered By: Army Melia on 10/18/2019 09:26:36 Heyden, [name] ([id number]) -------------------------------------------------------------------------------- Multi-Disciplinary Care Plan Details Patient Name: [name], [name]. Date of Service: 10/18/2019 9:00 AM Medical Record Number: [id number] Patient Account Number: [id number] Date of Birth/Sex: 10/29/1951 (68 y.o. M) Treating RN: Army Melia Primary Care Jeri Rawlins: Aura Dials Other Clinician: Referring Lulabelle Desta: Aura Dials Treating Dajon Lazar/Extender: Melburn Hake, HOYT Weeks in Treatment: 3 Active Inactive Nutrition Nursing Diagnoses: Impaired glucose control: actual or potential Goals: Patient/caregiver verbalizes understanding of need to maintain therapeutic glucose control per primary care physician Date Initiated: 09/27/2019 Target Resolution Date: 10/19/2019 Goal Status: Active Interventions: Assess HgA1c results as ordered upon admission and as  needed Notes: Orientation to the Wound Care Program Nursing Diagnoses: Knowledge deficit related to the wound healing center program Goals: Patient/caregiver will verbalize understanding of the Green Date Initiated: 09/27/2019 Target Resolution Date: 10/19/2019 Goal Status: Active Interventions: Provide education on orientation to the wound center Notes: Wound/Skin Impairment Nursing Diagnoses: Impaired tissue integrity Goals: Ulcer/skin breakdown will have a volume reduction of 30% by week 4 Date Initiated: 09/27/2019 Target Resolution Date: 10/25/2019 Goal Status: Active Interventions: Assess ulceration(s) every visit [name], [name] Notes: Electronic Signature(s) Signed: 10/18/2019 12:37:56 PM By: Army Melia Entered By: Army Melia on 10/18/2019 09:26:15 Dubs, Witt M. ([id number]) -------------------------------------------------------------------------------- Non-Wound Condition Assessment Details Patient Name: [name]. Date of Service: 10/18/2019 9:00 AM Medical Record Number: [id number] Patient Account Number: [id number] Date of Birth/Sex: [birth year] (68 y.o. M) Treating RN: Harold Barban Primary Care Jacek Colson: Aura Dials Other Clinician: Referring Tasheka Houseman: Aura Dials Treating Dierdre Mccalip/Extender: STONE III, HOYT Weeks in Treatment: 3 Non-Wound Condition: Condition: Lymphedema Location: Leg Side: Bilateral Photos Electronic Signature(s) Signed: 10/18/2019 4:50:58 PM By: Harold Barban Entered By: Harold Barban on 10/18/2019 09:17:59 Milham, [name] ([id number]) -------------------------------------------------------------------------------- Pain Assessment Details Patient Name: [name]. Date of Service: 10/18/2019 9:00 AM Medical Record Number: [id number] Patient Account Number: [id number] Date of Birth/Sex: Oct 04, 1951 (68 y.o. M) Treating RN: Harold Barban Primary Care Cammi Consalvo: Aura Dials Other Clinician: Referring Kaydin Karbowski: Aura Dials Treating Kynslie Ringle/Extender: Melburn Hake, HOYT Weeks in Treatment: 3 Active Problems Location of Pain Severity and Description of Pain Patient Has Paino No Site Locations Pain Management and Medication Current Pain Management: Electronic Signature(s) Signed: 10/18/2019 4:50:58 PM By: Harold Barban Entered By: Harold Barban on 10/18/2019 09:12:19 Balster, [name] ([id number]) -------------------------------------------------------------------------------- Patient/Caregiver Education Details Patient Name: [name]. Date of Service: 10/18/2019 9:00 AM Medical Record Number: [id number] Patient Account Number: [id number] Date of Birth/Gender: 1950-12-03 (68 y.o. M) Treating RN: Army Melia Primary Care Physician: Aura Dials Other Clinician: Referring Physician: Aura Dials Treating Physician/Extender: Sharalyn Ink in Treatment: 3 Education Assessment Education Provided To: Patient Education Topics Provided Wound/Skin Impairment: Handouts: Caring for Your Ulcer Methods: Demonstration, Explain/Verbal Responses: State content correctly Electronic Signature(s) Signed: 10/18/2019 12:37:56 PM By: Army Melia Entered By: Army Melia on 10/18/2019 09:37:47 [place], [name] ([id number]) -------------------------------------------------------------------------------- Vitals Details Patient Name: [name]. Date of Service: 10/18/2019 9:00 AM Medical Record Number: [id number] Patient Account Number: [id number] Date of Birth/Sex: Apr 21, 1951 (68 y.o. M) Treating RN: Harold Barban Primary Care Jhoselin Crume: Aura Dials Other Clinician: Referring Roan Miklos: Aura Dials Treating Madellyn Denio/Extender: Melburn Hake, HOYT Weeks in Treatment: 3 Vital Signs Time Taken: 09:10 Temperature (F): 98.3 Height (in): 69 Pulse (bpm): 88 Weight (lbs): 295 Respiratory Rate (breaths/min): 18 Body Mass Index  (BMI): 43.6 Blood Pressure (mmHg): 154/71 Reference  Range: 80 - 120 mg / dl Electronic Signature(s) Signed: 10/18/2019 4:50:58 PM By: Harold Barban Entered By: Harold Barban on 10/18/2019 09:13:02

## 2019-10-18 NOTE — Progress Notes (Addendum)
[name] Visit Report for 10/18/2019 Chief Complaint Document Details Patient Name: [name]. Date of Service: 10/18/2019 9:00 AM Medical Record Number: [id number] Patient Account Number: [id number] Date of Birth/Sex: Aug 31, 1951 (68 y.o. M) Treating RN: Army Melia Primary Care Provider: Aura Dials Other Clinician: Referring Provider: Aura Dials Treating Provider/Extender: Melburn Hake, HOYT Weeks in Treatment: 3 Information Obtained from: Patient Chief Complaint Bilateral LE lymphedema left leg cellulitis Electronic Signature(s) Signed: 10/18/2019 9:22:08 AM By: Worthy Keeler PA-C Entered By: Worthy Keeler on 10/18/2019 09:22:08 [name], [name] -------------------------------------------------------------------------------- HPI Details Patient Name: [name]. Date of Service: 10/18/2019 9:00 AM Medical Record Number: [id number] Patient Account Number: [id number] Date of Birth/Sex: [birth year] (68 y.o. M) Treating RN: Army Melia Primary Care Provider: Aura Dials Other Clinician: Referring Provider: Aura Dials Treating Provider/Extender: Melburn Hake, HOYT Weeks in Treatment: 3 History of Present Illness HPI Description: 09/27/2019 on evaluation today patient appears to be doing poorly in regard to his bilateral lower extremities he does have bilateral lower extremity lymphedema. With that being said he also has a history of diabetes type 2, hypertension, congestive heart failure, and apparently has chronic venous insufficiency which has led to the lymphedema. Currently he also shows signs of an infection of the left lower extremity which does have me concerned at this point as well. He tells me that he is not really having any significant pain but has had a lot of trouble with his legs in general. He does have a compression stocking he is wearing on the right lower extremity he was wrapped with an Unna boot on the left  lower extremity. The patient tells me that overall he has been doing with this for several weeks and home health has been initiated. He has not been on any antibiotics yet and he tells me that the home health nurse was not sure that it was infected based on what I see today I am concerned that this likely is infected at this point. Fortunately however there does not appear to be any evidence of systemic infection which is good news. The Unna boot does seem to be helping control the edema although he may benefit from a stronger compression wrap I think that at this point without having the ABIs completed the Unna boot is where I would stand. He is supposed to be having an arterial study with ABI as ordered by his cardiologist but he states he may have missed that appointment and has not called to reschedule he needs to contact them to get this rescheduled as soon as possible I explained to him. He tells me that he is draining a lot as far as the left lower extremity is concerned and that is something that he feels like is not lasting very long as far as the wraps are concerned when they put him on they seem to get wet extremely quickly. 10/04/2019 on evaluation today patient appears to be doing better compared to last week's evaluation. Fortunately there is no signs of active infection at this time. He has been tolerating the dressing changes without complication. He did undergo arterial studies yesterday and has an appointment tomorrow with the vascular specialist. With that being said his ABI on the left was 1.25 on the right 1.19 with a TBI of 0.61 on the left and 0.69 on the right. There was stated to be no evidence of obvious significant arterial disease and the patient had pretty much triphasic blood flow throughout. Overall  I am very pleased with how things seem to be progressing. We will see if the vascular doctor says anything different tomorrow but I even at this point I feel like he would  do well with a stronger compression wrap to try to get some the fluid out of his leg at this point. The good news is his drainage has slowed down considerably I do believe the antibiotics have been beneficial for him. 10/18/2019 on evaluation today patient appears to be doing poorly in regard to his left lower extremity. He still having a lot of drainage and weeping from his leg this appears to be much more erythematous and in fact I am concerned that the erythema may be spreading up his leg as well. He has not experienced any fevers, chills, nausea, vomiting, or diarrhea up to this point. I explained to him that I really feel like based on what I am seeing he may need to potentially go to the hospital for evaluation and treatment potentially even with IV antibiotics depending on what they see. With that being said he tells me that he is not really able to do that due to the fact that he is the primary caregiver for his wife who is legally blind. He states he is not good to be able to have anything to help as far as caring for her if he were to go into the hospital. Nonetheless he is unsure what to do in this regard. I really think that he needs faster treatment. For that reason I am going to likely try to get in touch with the infectious disease office to see if I can get him in sooner than [place]. Electronic Signature(s) Signed: 10/18/2019 9:41:12 AM By: Worthy Keeler PA-C Entered By: Worthy Keeler on 10/18/2019 09:41:12 [name], [name] -------------------------------------------------------------------------------- Physical Exam Details Patient Name: [name], [name]. Date of Service: 10/18/2019 9:00 AM Medical Record Number: [id number] Patient Account Number: [id number] Date of Birth/Sex: 05-Aug-1951 (68 y.o. M) Treating RN: Army Melia Primary Care Provider: Aura Dials Other Clinician: Referring Provider: Aura Dials Treating Provider/Extender: STONE III,  HOYT Weeks in Treatment: 3 Constitutional Obese and well-hydrated in no acute distress. Respiratory normal breathing without difficulty. clear to auscultation bilaterally. Cardiovascular regular rate and rhythm with normal S1, S2. Psychiatric this patient is able to make decisions and demonstrates good insight into disease process. Alert and Oriented x 3. pleasant and cooperative. Notes Patient's wound bed currently again is really not as much an open wound as it appears that he is having more drainage in general which is secondary to cellulitis. I really think that the cellulitis is worse than what it was when I saw him last and this does concern me to be honest. I really think that he needs to be seen as soon as possible by a specialist in this regard in order to try to get things under control I really recommended that a visit to the ER would probably be warranted in this case to try to get things under control. With that being said he tells me that that is not an option for him at all as he again is the primary caregiver for his wife who is legally blind. For that reason I would not see we can do with infectious disease as an outpatient. Electronic Signature(s) Signed: 10/18/2019 9:42:08 AM By: Worthy Keeler PA-C Entered By: Worthy Keeler on 10/18/2019 09:42:08 [name] -------------------------------------------------------------------------------- Physician Orders Details Patient Name: Keith Donna  M. Date of Service: 10/18/2019 9:00 AM Medical Record Number: [id number] Patient Account Number: [id number] Date of Birth/Sex: [birth year] (68 y.o. M) Treating RN: Army Melia Primary Care Provider: Aura Dials Other Clinician: Referring Provider: Aura Dials Treating Provider/Extender: Melburn Hake, HOYT Weeks in Treatment: 3 Verbal / Phone Orders: No Diagnosis Coding ICD-10 Coding Code Description I89.0 Lymphedema, not elsewhere classified I87.2  Venous insufficiency (chronic) (peripheral) E11.628 Type 2 diabetes mellitus with other skin complications 99991111 Essential (primary) hypertension I50.42 Chronic combined systolic (congestive) and diastolic (congestive) heart failure L02.416 Cutaneous abscess of left lower limb Wound Cleansing o Clean wound with Normal Saline. - in office o Cleanse wound with mild soap and water - left leg Primary Wound Dressing o Silver Alginate - over weeping areas Secondary Dressing o ABD pad Dressing Change Frequency o Dressing is to be changed Monday and Thursday. - Monday by [facility] Follow-up Appointments o Return Appointment in 1 week. Edema Control o 4-Layer Compression System - Left Lower Extremity. o Other: - Unna to anchor [place] Visits - [place] Nurse may visit PRN to address patientos wound care needs. o FACE TO FACE ENCOUNTER: MEDICARE and MEDICAID PATIENTS: I certify that this patient is under my care and that I had a face-to-face encounter that meets the physician face-to-face encounter requirements with this patient on this date. The encounter with the patient was in whole or in part for the following MEDICAL CONDITION: (primary reason for [place]) MEDICAL NECESSITY: I certify, that based on my findings, NURSING services are a medically necessary home health service. HOME BOUND STATUS: I certify that my clinical findings support that this patient is homebound (i.e., Due to illness or injury, pt requires aid of supportive devices such as crutches, cane, wheelchairs, walkers, the use of special transportation or the assistance of another person to leave their place of residence. There is a normal inability to leave the home and doing so requires considerable and taxing effort. Other absences are for medical reasons / religious services and are infrequent or of short duration when for other reasons). o If current  dressing causes regression in wound condition, may D/C ordered dressing product/s and apply Normal Saline Moist Dressing daily until next [place] / Other MD appointment. [place] of regression in wound condition at [phone]. o Please direct any NON-WOUND related issues/requests for orders to patient's Primary Care Physician [name], [name] Consults o Infectious Disease - cellulitis of left leg Patient Medications Allergies: codeine Notifications Medication Indication Start End doxycycline hyclate 10/18/2019 DOSE 1 - oral 100 mg capsule - 1 capsule oral taken 2 times a day for 14 days Electronic Signature(s) Signed: 10/18/2019 9:43:17 AM By: Worthy Keeler PA-C Entered By: Worthy Keeler on 10/18/2019 09:43:17 [place], [name] -------------------------------------------------------------------------------- Problem List Details Patient Name: [name]. Date of Service: 10/18/2019 9:00 AM Medical Record Number: [id number] Patient Account Number: [id number] Date of Birth/Sex: 1951/01/24 (68 y.o. M) Treating RN: Army Melia Primary Care Provider: Aura Dials Other Clinician: Referring Provider: Aura Dials Treating Provider/Extender: Melburn Hake, HOYT Weeks in Treatment: 3 Active Problems ICD-10 Evaluated Encounter Code Description Active Date Today Diagnosis I89.0 Lymphedema, not elsewhere classified 09/27/2019 No Yes I87.2 Venous insufficiency (chronic) (peripheral) 09/27/2019 No Yes E11.628 Type 2 diabetes mellitus with other skin complications 123XX123 No Yes I10 Essential (primary) hypertension 09/27/2019 No Yes I50.42 Chronic combined systolic (congestive) and diastolic 123XX123 No Yes (congestive) heart failure L02.416 Cutaneous  abscess of left lower limb 09/27/2019 No Yes Inactive Problems Resolved Problems Electronic Signature(s) Signed: 10/18/2019 9:22:02 AM By: Worthy Keeler PA-C Entered  By: Worthy Keeler on 10/18/2019 09:22:01 [place], [name] -------------------------------------------------------------------------------- Progress Note Details Patient Name: [name]. Date of Service: 10/18/2019 9:00 AM Medical Record Number: [id number] Patient Account Number: [id number] Date of Birth/Sex: 1951/04/05 (68 y.o. M) Treating RN: Army Melia Primary Care Provider: Aura Dials Other Clinician: Referring Provider: Aura Dials Treating Provider/Extender: Melburn Hake, HOYT Weeks in Treatment: 3 Subjective Chief Complaint Information obtained from Patient Bilateral LE lymphedema left leg cellulitis History of Present Illness (HPI) 09/27/2019 on evaluation today patient appears to be doing poorly in regard to his bilateral lower extremities he does have bilateral lower extremity lymphedema. With that being said he also has a history of diabetes type 2, hypertension, congestive heart failure, and apparently has chronic venous insufficiency which has led to the lymphedema. Currently he also shows signs of an infection of the left lower extremity which does have me concerned at this point as well. He tells me that he is not really having any significant pain but has had a lot of trouble with his legs in general. He does have a compression stocking he is wearing on the right lower extremity he was wrapped with an Unna boot on the left lower extremity. The patient tells me that overall he has been doing with this for several weeks and home health has been initiated. He has not been on any antibiotics yet and he tells me that the home health nurse was not sure that it was infected based on what I see today I am concerned that this likely is infected at this point. Fortunately however there does not appear to be any evidence of systemic infection which is good news. The Unna boot does seem to be helping control the edema although he may benefit from a stronger  compression wrap I think that at this point without having the ABIs completed the Unna boot is where I would stand. He is supposed to be having an arterial study with ABI as ordered by his cardiologist but he states he may have missed that appointment and has not called to reschedule he needs to contact them to get this rescheduled as soon as possible I explained to him. He tells me that he is draining a lot as far as the left lower extremity is concerned and that is something that he feels like is not lasting very long as far as the wraps are concerned when they put him on they seem to get wet extremely quickly. 10/04/2019 on evaluation today patient appears to be doing better compared to last week's evaluation. Fortunately there is no signs of active infection at this time. He has been tolerating the dressing changes without complication. He did undergo arterial studies yesterday and has an appointment tomorrow with the vascular specialist. With that being said his ABI on the left was 1.25 on the right 1.19 with a TBI of 0.61 on the left and 0.69 on the right. There was stated to be no evidence of obvious significant arterial disease and the patient had pretty much triphasic blood flow throughout. Overall I am very pleased with how things seem to be progressing. We will see if the vascular doctor says anything different tomorrow but I even at this point I feel like he would do well with a stronger compression wrap to try to get some the fluid out  of his leg at this point. The good news is his drainage has slowed down considerably I do believe the antibiotics have been beneficial for him. 10/18/2019 on evaluation today patient appears to be doing poorly in regard to his left lower extremity. He still having a lot of drainage and weeping from his leg this appears to be much more erythematous and in fact I am concerned that the erythema may be spreading up his leg as well. He has not experienced any  fevers, chills, nausea, vomiting, or diarrhea up to this point. I explained to him that I really feel like based on what I am seeing he may need to potentially go to the hospital for evaluation and treatment potentially even with IV antibiotics depending on what they see. With that being said he tells me that he is not really able to do that due to the fact that he is the primary caregiver for his wife who is legally blind. He states he is not good to be able to have anything to help as far as caring for her if he were to go into the hospital. Nonetheless he is unsure what to do in this regard. I really think that he needs faster treatment. For that reason I am going to likely try to get in touch with the infectious disease office to see if I can get him in sooner than [place]. Patient History Information obtained from Patient. Family History [name], [name] Cancer - Paternal Grandparents, Diabetes - Mother, No family history of Heart Disease, Hypertension, Kidney Disease, Lung Disease, Seizures, Stroke, Thyroid Problems, Tuberculosis. Social History Former smoker - 54 years ago, Marital Status - Married, Alcohol Use - Rarely, Drug Use - No History, Caffeine Use - Daily. Medical History Cardiovascular Patient has history of Congestive Heart Failure Endocrine Patient has history of Type II Diabetes Musculoskeletal Patient has history of Osteoarthritis - Hands Review of Systems (ROS) Constitutional Symptoms (General Health) Denies complaints or symptoms of Fatigue, Fever, Chills, Marked Weight Change. Respiratory Denies complaints or symptoms of Chronic or frequent coughs, Shortness of Breath. Cardiovascular Complains or has symptoms of LE edema. Denies complaints or symptoms of Chest pain. Psychiatric Denies complaints or symptoms of Anxiety, Claustrophobia. Objective Constitutional Obese and well-hydrated in no acute distress. Vitals Time Taken: 9:10 AM, Height:  69 in, Weight: 295 lbs, BMI: 43.6, Temperature: 98.3 F, Pulse: 88 bpm, Respiratory Rate: 18 breaths/min, Blood Pressure: 154/71 mmHg. Respiratory normal breathing without difficulty. clear to auscultation bilaterally. Cardiovascular regular rate and rhythm with normal S1, S2. Psychiatric this patient is able to make decisions and demonstrates good insight into disease process. Alert and Oriented x 3. pleasant and cooperative. General Notes: Patient's wound bed currently again is really not as much an open wound as it appears that he is having more drainage in general which is secondary to cellulitis. I really think that the cellulitis is worse than what it was when I saw him last and this does concern me to be honest. I really think that he needs to be seen as soon as possible by a specialist in this regard in order to try to get things under control I really recommended that a visit to the ER would probably be warranted in this case to try to get things under control. With that being said he tells me that that is not an option for him at all as he again is the primary caregiver for his wife who is legally blind. For that  reason I would not see we can do with infectious disease as ALIJAH, GLISAN. ([id number]) an outpatient. Other Condition(s) Patient presents with Lymphedema located on the Bilateral Leg. Assessment Active Problems ICD-10 Lymphedema, not elsewhere classified Venous insufficiency (chronic) (peripheral) Type 2 diabetes mellitus with other skin complications Essential (primary) hypertension Chronic combined systolic (congestive) and diastolic (congestive) heart failure Cutaneous abscess of left lower limb Procedures There was a Four Layer Compression Therapy Procedure with a pre-treatment ABI of 1.2 by Army Melia, RN. Post procedure Diagnosis Wound #: Same as Pre-Procedure Plan Wound Cleansing: Clean wound with Normal Saline. - in office Cleanse wound with mild  soap and water - left leg Primary Wound Dressing: Silver Alginate - over weeping areas Secondary Dressing: ABD pad Dressing Change Frequency: Dressing is to be changed Monday and Thursday. - Monday by [facility] Follow-up Appointments: Return Appointment in 1 week. Edema Control: 4-Layer Compression System - Left Lower Extremity. Other: Louretta Parma to anchor wrap Home Health: [place] Nurse may visit PRN to address patient s wound care needs. FACE TO FACE ENCOUNTER: MEDICARE and MEDICAID PATIENTS: I certify that this patient is under my care and that I had a face-to-face encounter that meets the physician face-to-face encounter requirements with this patient on this date. The encounter with the patient was in whole or in part for the following MEDICAL CONDITION: (primary reason for [place]) MEDICAL NECESSITY: I certify, that based on my findings, NURSING services are a medically necessary home [name], [name]. ([id number]) health service. HOME BOUND STATUS: I certify that my clinical findings support that this patient is homebound (i.e., Due to illness or injury, pt requires aid of supportive devices such as crutches, cane, wheelchairs, walkers, the use of special transportation or the assistance of another person to leave their place of residence. There is a normal inability to leave the home and doing so requires considerable and taxing effort. Other absences are for medical reasons / religious services and are infrequent or of short duration when for other reasons). If current dressing causes regression in wound condition, may D/C ordered dressing product/s and apply Normal Saline Moist Dressing daily until next [place] / Other MD appointment. [place] of regression in wound condition at [phone]. Please direct any NON-WOUND related issues/requests for orders to patient's Primary Care Physician Consults ordered  were: Infectious Disease - cellulitis of left leg The following medication(s) was prescribed: doxycycline hyclate oral 100 mg capsule 1 1 capsule oral taken 2 times a day for 14 days starting 10/18/2019 1. My suggestion at this time is good to be that we go ahead and continue with a 4-layer compression wrap to try to help with edema control. With that being said I would still do believe that he has an infection I am get a manage this as best I can with the use of doxycycline that I will go ahead and start for him today. Unfortunately his previous culture did not reveal any specific organisms that we can tailor treatment for the Cipro that he was on does not seem to have done as well as I would have liked and therefore I am getting go ahead and make a switch at this point in time today. 2. With regard to the infection I really wanted him to go to the ER for further evaluation but he tells me unequivocally that is not an option. He tells me that he has no one who can  care for his wife who is legally blind if he were to go to the hospital. With that being said for that reason I am get a contact the infectious disease clinic and see if they could potentially see this patient as an outpatient and see if they would recommend anything different I am concerned about the possibility of spreading infection obviously at this point even the possibility of developing into sepsis if not properly managed. 3. I am going to reinitiate the 4 layer compression wrap to help with edema control is much as we can. He has home health as well who is changing this and keeping an eye on things as well. 4. I do recommend he elevate his leg is much as possible. I think this is also very important. 5. I did discuss as to whether or not there was any 1 in his community as far as friends or family that could help with taking care of his wife they have a daughter who is a Marine scientist in [employer] but he tells me that she works all the  time seemingly and he is not sure she would be able to help in this case. I explained that he may end up having to do such just depending on how things go. We will see patient back for reevaluation in 1 week here in the clinic. If anything worsens or changes patient will contact our office for additional recommendations. I did explain to the patient again that my recommendation was for him to go to the ER as I am concerned about the possibility of him becoming septic. He really was not able to do that again secondary to his wife and having to care for her who is legally blind. For that reason I told him to look out for anything such as fevers, chills, nausea, vomiting, or diarrhea which may in the end be an indication of him becoming septic. If any this occurs he needs to go to the ER unequivocally ASAP by the way of EMS most likely. Electronic Signature(s) Signed: 10/18/2019 9:47:43 AM By: Worthy Keeler PA-C Previous Signature: 10/18/2019 9:45:01 AM Version By: Worthy Keeler PA-C Entered By: Worthy Keeler on 10/18/2019 09:47:43 [name], [name] -------------------------------------------------------------------------------- ROS/PFSH Details Patient Name: [name]. Date of Service: 10/18/2019 9:00 AM Medical Record Number: [id number] Patient Account Number: [id number] Date of Birth/Sex: [birth year] (68 y.o. M) Treating RN: Army Melia Primary Care Provider: Aura Dials Other Clinician: Referring Provider: Aura Dials Treating Provider/Extender: Melburn Hake, HOYT Weeks in Treatment: 3 Information Obtained From Patient Constitutional Symptoms (General Health) Complaints and Symptoms: Negative for: Fatigue; Fever; Chills; Marked Weight Change Respiratory Complaints and Symptoms: Negative for: Chronic or frequent coughs; Shortness of Breath Cardiovascular Complaints and Symptoms: Positive for: LE edema Negative for: Chest pain Medical History: Positive  for: Congestive Heart Failure Psychiatric Complaints and Symptoms: Negative for: Anxiety; Claustrophobia Endocrine Medical History: Positive for: Type II Diabetes Time with diabetes: 1 year Treated with: Oral agents Blood sugar tested every day: No Musculoskeletal Medical History: Positive for: Osteoarthritis - Hands Immunizations Pneumococcal Vaccine: Received Pneumococcal Vaccination: No Implantable Devices None Family and Social History [name], [name] Cancer: Yes - Paternal Grandparents; Diabetes: Yes - Mother; Heart Disease: No; Hypertension: No; Kidney Disease: No; Lung Disease: No; Seizures: No; Stroke: No; Thyroid Problems: No; Tuberculosis: No; Former smoker - 51 years ago; Marital Status - Married; Alcohol Use: Rarely; Drug Use: No History; Caffeine Use: Daily Physician Affirmation I have reviewed and  agree with the above information. Electronic Signature(s) Signed: 10/18/2019 12:37:56 PM By: Army Melia Signed: 10/18/2019 5:16:54 PM By: Worthy Keeler PA-C Entered By: Worthy Keeler on 10/18/2019 09:41:32 [name], [name] -------------------------------------------------------------------------------- SuperBill Details Patient Name: [name]. Date of Service: 10/18/2019 Medical Record Number: [id number] Patient Account Number: [id number] Date of Birth/Sex: Oct 10, 1951 (68 y.o. M) Treating RN: Army Melia Primary Care Provider: Aura Dials Other Clinician: Referring Provider: Aura Dials Treating Provider/Extender: Melburn Hake, HOYT Weeks in Treatment: 3 Diagnosis Coding ICD-10 Codes Code Description I89.0 Lymphedema, not elsewhere classified I87.2 Venous insufficiency (chronic) (peripheral) E11.628 Type 2 diabetes mellitus with other skin complications 99991111 Essential (primary) hypertension I50.42 Chronic combined systolic (congestive) and diastolic (congestive) heart failure L02.416 Cutaneous abscess of left lower  limb Facility Procedures CPT4 Code: [id number] Description: (Facility Use Only) [phone] - [place] LWR LT LEG Modifier: Quantity: 1 Physician Procedures CPT4 Code: [id number] Description: 99214 - WC PHYS LEVEL 4 - EST PT ICD-10 Diagnosis Description I89.0 Lymphedema, not elsewhere classified I87.2 Venous insufficiency (chronic) (peripheral) E11.628 Type 2 diabetes mellitus with other skin complications 99991111 Essential (primary)  hypertension Modifier: Quantity: 1 Electronic Signature(s) Signed: 10/18/2019 9:45:13 AM By: Worthy Keeler PA-C Entered By: Worthy Keeler on 10/18/2019 09:45:13

## 2019-10-22 ENCOUNTER — Ambulatory Visit: Payer: Medicare Other | Admitting: Infectious Disease

## 2019-10-22 ENCOUNTER — Ambulatory Visit
Admission: RE | Admit: 2019-10-22 | Discharge: 2019-10-22 | Disposition: A | Discharge status: Home / Self Care | Payer: Medicare Other | Source: Ambulatory Visit | Attending: Infectious Disease | Admitting: Infectious Disease

## 2019-10-22 ENCOUNTER — Encounter: Payer: Self-pay | Admitting: Infectious Disease

## 2019-10-22 ENCOUNTER — Other Ambulatory Visit: Payer: Self-pay

## 2019-10-22 VITALS — Wt 303.0 lb

## 2019-10-22 DIAGNOSIS — E11628 Type 2 diabetes mellitus with other skin complications: Secondary | ICD-10-CM | POA: Diagnosis not present

## 2019-10-22 DIAGNOSIS — L089 Local infection of the skin and subcutaneous tissue, unspecified: Secondary | ICD-10-CM | POA: Insufficient documentation

## 2019-10-22 DIAGNOSIS — I890 Lymphedema, not elsewhere classified: Secondary | ICD-10-CM

## 2019-10-22 DIAGNOSIS — I10 Essential (primary) hypertension: Secondary | ICD-10-CM | POA: Diagnosis not present

## 2019-10-22 DIAGNOSIS — I872 Venous insufficiency (chronic) (peripheral): Secondary | ICD-10-CM | POA: Diagnosis not present

## 2019-10-22 DIAGNOSIS — I5032 Chronic diastolic (congestive) heart failure: Secondary | ICD-10-CM

## 2019-10-22 HISTORY — DX: Local infection of the skin and subcutaneous tissue, unspecified: L08.9

## 2019-10-22 HISTORY — DX: Lymphedema, not elsewhere classified: I89.0

## 2019-10-22 HISTORY — DX: Type 2 diabetes mellitus with other skin complications: E11.628

## 2019-10-22 NOTE — Progress Notes (Signed)
Subjective:  Reason for infectious disease consult: Lower extremity erythema and concern for direct deep diabetic foot infection  Requesting physician: [name]    Patient ID: Keith Reading., male    DOB: 12/13/1950, 68 y.o.   MRN: [id number]  HPI  [name] is a 68 year old Caucasian male with history of congestive heart failure, orbit obesity chronic lower extremity edema diabetes mellitus.  He has had drainage from his left lower extremity for multiple months as documented in September when he was seen by them on cardiovascular.  He is more recently been followed by wound care and been seen by [name]. he has been given antibiotics in the form of ciprofloxacin without improvement in the erythema or the chronic drainage from his left foot more recently was initiated on doxycycline which has been taking for the last several days.  He has had evaluation by vascular surgery with ABIs which were within normal range.  Wound care provider was visually concerned about his erythema that he considered admitting the patient through the emergency department with the patient refused stating that he needs to build to take care of his wife who is blind.    He denies smoking he tells me his diabetes is under better control he is followed by [name].  He does take torsemide for his CHF.  Denies fevers chills or systemic symptoms.  On exam today does have significant erythema of his left lower extremity and does have foul-smelling drainage in this area but not an overt abscess or ulcer that I could appreciate.  I think much of this may be related to congestive heart failure and venous stasis though certainly his erythema is much more impressive on the left side versus the right.  I will obtain plain films of his foot and his ankle.  We will check labs today if CBC is okay we will swap and Zyvox for doxycycline to see if this makes any improvement in his erythema.  We discussed  option of getting an MRI for more aggressive imaging of his foot and ankle.  He adamantly refused influenza vaccination.  Past Medical History:  Diagnosis Date  . Arthritis   . Back pain    occasionally  . BPH (benign prostatic hypertrophy)    takes Uroxatral daily as well as Finasteride   . Diabetes mellitus without complication ([place])    takes Metformin daily  . Diabetic foot infection ([place]) 10/22/2019  . GERD (gastroesophageal reflux disease)    no meds  . Hepatitis hx of   at age 62  . Hypertension    takes Losartan and HCTZ daily  . Joint pain   . Urinary frequency   . Urinary urgency     Past Surgical History:  Procedure Laterality Date  . APPENDECTOMY     age 41  . COLONOSCOPY    . KNEE ARTHROSCOPY  1984   right and left   . SHOULDER ARTHROSCOPY  2003   right RCR-GSC-stayed overnight-had SOB  . SHOULDER ARTHROSCOPY WITH ROTATOR CUFF REPAIR AND SUBACROMIAL DECOMPRESSION Left 03/13/2013   Procedure: LEFT SHOULDER ARTHROSCOPY WITH SUBACROMIAL DECOMPRESSION, DISTAL CLAVICLE RESECTION AND REPAIR ROTATOR CUFF AND LABRAL DEBRIDEMENT;  Surgeon: [name]., MD;  Location: [facility];  Service: Orthopedics;  Laterality: Left;  . TOTAL HIP ARTHROPLASTY Right 01/29/2015   Procedure: TOTAL HIP ARTHROPLASTY ANTERIOR APPROACH;  Surgeon: [name], MD;  Location: [facility];  Service: Orthopedics;  Laterality: Right;  .  VASECTOMY    . VASECTOMY REVERSAL  1993    Family History  Problem Relation Age of Onset  . Benign prostatic hyperplasia Father   . Kidney cancer Neg Hx   . Kidney disease Neg Hx       Social History   Socioeconomic History  . Marital status: Married    Spouse name: Not on file  . Number of children: 3  . Years of education: Not on file  . Highest education level: Not on file  Occupational History  . Not on file  Social Needs  . Financial resource strain: Not on file  . Food insecurity    Worry: Not on file    Inability: Not on  file  . Transportation needs    Medical: Not on file    Non-medical: Not on file  Tobacco Use  . Smoking status: Never Smoker  . Smokeless tobacco: Former Network engineer and Sexual Activity  . Alcohol use: Yes    Alcohol/week: 0.0 standard drinks    Comment: occasionally beer or scotch  . Drug use: No  . Sexual activity: Yes  Lifestyle  . Physical activity    Days per week: Not on file    Minutes per session: Not on file  . Stress: Not on file  Relationships  . Social Herbalist on phone: Not on file    Gets together: Not on file    Attends religious service: Not on file    Active member of club or organization: Not on file    Attends meetings of clubs or organizations: Not on file    Relationship status: Not on file  Other Topics Concern  . Not on file  Social History Narrative  . Not on file    Allergies  Allergen Reactions  . Codeine Swelling    Water retention     Current Outpatient Medications:  .  Cholecalciferol (VITAMIN D3) 5000 UNITS TABS, Take 1 tablet by mouth daily. Reported on 04/29/2016, Disp: , Rfl:  .  ciprofloxacin (CIPRO) 500 MG tablet, Take 500 mg by mouth 2 (two) times daily., Disp: , Rfl:  .  finasteride (PROSCAR) 5 MG tablet, Take 5 mg by mouth daily., Disp: , Rfl:  .  GABAPENTIN PO, Take by mouth as needed., Disp: , Rfl:  .  glipiZIDE (GLUCOTROL XL) 5 MG 24 hr tablet, Take 5 mg by mouth 3 (three) times daily with meals., Disp: , Rfl:  .  metoprolol succinate (TOPROL-XL) 50 MG 24 hr tablet, TAKE 1 TABLET BY MOUTH  DAILY WITH OR IMMEDIATLEY  FOLLOWING A MEAL, Disp: 90 tablet, Rfl: 2 .  potassium chloride SA (KLOR-CON) 20 MEQ tablet, Take 1 tablet by mouth daily, Disp: 90 tablet, Rfl: 1 .  torsemide (DEMADEX) 20 MG tablet, Take 2 tablets (40 mg total) by mouth daily., Disp: 180 tablet, Rfl: 3   Review of Systems  Constitutional: Negative for activity change, appetite change, chills, diaphoresis, fatigue, fever and unexpected weight  change.  HENT: Negative for congestion, rhinorrhea, sinus pressure, sneezing, sore throat and trouble swallowing.   Eyes: Negative for photophobia and visual disturbance.  Respiratory: Negative for cough, chest tightness, shortness of breath, wheezing and stridor.   Cardiovascular: Positive for leg swelling. Negative for chest pain and palpitations.  Gastrointestinal: Negative for abdominal distention, abdominal pain, anal bleeding, blood in stool, constipation, diarrhea, nausea and vomiting.  Genitourinary: Negative for difficulty urinating, dysuria, flank pain and hematuria.  Musculoskeletal: Negative for arthralgias,  back pain, gait problem, joint swelling and myalgias.  Skin: Positive for color change. Negative for pallor, rash and wound.  Neurological: Negative for dizziness, tremors, weakness and light-headedness.  Hematological: Negative for adenopathy. Does not bruise/bleed easily.  Psychiatric/Behavioral: Negative for agitation, behavioral problems, confusion, decreased concentration, dysphoric mood and sleep disturbance.       Objective:   Physical Exam Constitutional:      Appearance: He is well-developed. He is obese.  HENT:     Head: Normocephalic and atraumatic.  Eyes:     Extraocular Movements: Extraocular movements intact.     Conjunctiva/sclera: Conjunctivae normal.  Neck:     Musculoskeletal: Normal range of motion and neck supple.  Cardiovascular:     Rate and Rhythm: Normal rate and regular rhythm.  Pulmonary:     Effort: Pulmonary effort is normal. No respiratory distress.     Breath sounds: No wheezing.  Abdominal:     General: There is no distension.     Palpations: Abdomen is soft.  Musculoskeletal: Normal range of motion.        General: No tenderness.  Skin:    General: Skin is warm and dry.     Coloration: Skin is not pale.     Findings: Erythema present. No rash.  Neurological:     General: No focal deficit present.     Mental Status: He is alert  and oriented to person, place, and time.  Psychiatric:        Mood and Affect: Mood normal.        Behavior: Behavior normal.        Thought Content: Thought content normal.    Left leg 10/22/2019:           Right left12/05/2019:            Assessment & Plan:   Left leg with erythema and drainage: this could ALL be due to lymphedema and venous stasis dermatitis.   I do appreciate that is significantly more red and there is more drainage on the side.  I will get x-rays and labs today and trial him on Zyvox x2 weeks.  I do think he needs more aggressive diuresis though  We will consider getting MRI to exclude deep infection of bone and rechecking his sed rate and CRP today as well.  I will have him see me again next week.

## 2019-10-23 ENCOUNTER — Telehealth: Payer: Self-pay | Admitting: Infectious Disease

## 2019-10-23 DIAGNOSIS — L089 Local infection of the skin and subcutaneous tissue, unspecified: Secondary | ICD-10-CM

## 2019-10-23 DIAGNOSIS — E11628 Type 2 diabetes mellitus with other skin complications: Secondary | ICD-10-CM

## 2019-10-23 LAB — CBC WITH DIFFERENTIAL/PLATELET
Absolute Monocytes: 632 cells/uL (ref 200–950)
Basophils Absolute: 50 cells/uL (ref 0–200)
Basophils Relative: 0.7 %
Eosinophils Absolute: 192 cells/uL (ref 15–500)
Eosinophils Relative: 2.7 %
HCT: 39.7 % (ref 38.5–50.0)
Hemoglobin: 13.1 g/dL — ABNORMAL LOW (ref 13.2–17.1)
Lymphs Abs: 1356 cells/uL (ref 850–3900)
MCH: 30.2 pg (ref 27.0–33.0)
MCHC: 33 g/dL (ref 32.0–36.0)
MCV: 91.5 fL (ref 80.0–100.0)
MPV: 11.8 fL (ref 7.5–12.5)
Monocytes Relative: 8.9 %
Neutro Abs: 4871 cells/uL (ref 1500–7800)
Neutrophils Relative %: 68.6 %
Platelets: 203 10*3/uL (ref 140–400)
RBC: 4.34 10*6/uL (ref 4.20–5.80)
RDW: 12.5 % (ref 11.0–15.0)
Total Lymphocyte: 19.1 %
WBC: 7.1 10*3/uL (ref 3.8–10.8)

## 2019-10-23 LAB — COMPLETE METABOLIC PANEL WITH GFR
AG Ratio: 1.2 (calc) (ref 1.0–2.5)
ALT: 27 U/L (ref 9–46)
AST: 26 U/L (ref 10–35)
Albumin: 3.7 g/dL (ref 3.6–5.1)
Alkaline phosphatase (APISO): 79 U/L (ref 35–144)
BUN/Creatinine Ratio: 20 (calc) (ref 6–22)
BUN: 28 mg/dL — ABNORMAL HIGH (ref 7–25)
CO2: 28 mmol/L (ref 20–32)
Calcium: 7.9 mg/dL — ABNORMAL LOW (ref 8.6–10.3)
Chloride: 106 mmol/L (ref 98–110)
Creat: 1.39 mg/dL — ABNORMAL HIGH (ref 0.70–1.18)
GFR, Est African American: 59 mL/min/{1.73_m2} — ABNORMAL LOW (ref >=60)
GFR, Est Non African American: 51 mL/min/{1.73_m2} — ABNORMAL LOW (ref >=60)
Globulin: 3.1 g/dL (calc) (ref 1.9–3.7)
Glucose, Bld: 145 mg/dL — ABNORMAL HIGH (ref 65–99)
Potassium: 4.2 mmol/L (ref 3.5–5.3)
Sodium: 142 mmol/L (ref 135–146)
Total Bilirubin: 0.5 mg/dL (ref 0.2–1.2)
Total Protein: 6.8 g/dL (ref 6.1–8.1)

## 2019-10-23 LAB — C-REACTIVE PROTEIN: CRP: 3.7 mg/L (ref <8.0)

## 2019-10-23 LAB — SEDIMENTATION RATE: Sed Rate: 22 mm/h — ABNORMAL HIGH (ref 0–20)

## 2019-10-23 MED ORDER — LINEZOLID 600 MG PO TABS: 600.0000 mg | ORAL_TABLET | Freq: Two times a day (BID) | ORAL | 1 refills | Status: DC

## 2019-10-23 NOTE — Telephone Encounter (Signed)
Spoke with patient. He has already picked up zyvox, knows he will get a call with MRI appointment. Seems he wants to hold off on MRI until he sees you for follow up 12/15. Do you want the MRI before next visit if possible?

## 2019-10-23 NOTE — Telephone Encounter (Signed)
I'm ok to see him before MRI

## 2019-10-23 NOTE — Telephone Encounter (Signed)
We will try zyvox x 2 weeks.I have sent rx topharmacy. I also wrote order for MRI foot given? Finding on plain films of little toe

## 2019-10-24 NOTE — Telephone Encounter (Signed)
Ok very good. It would be good if he also would take flu vaccine but that isnt going to happen

## 2019-10-24 NOTE — Telephone Encounter (Signed)
It looks like his MRI is scheduled for 12/15 7am at [facility] and he sees you 12/15 at 9am.

## 2019-10-25 ENCOUNTER — Encounter: Payer: Medicare Other | Admitting: Physician Assistant

## 2019-10-25 ENCOUNTER — Telehealth: Payer: Self-pay

## 2019-10-25 ENCOUNTER — Other Ambulatory Visit: Payer: Self-pay

## 2019-10-25 DIAGNOSIS — I872 Venous insufficiency (chronic) (peripheral): Secondary | ICD-10-CM

## 2019-10-25 DIAGNOSIS — I890 Lymphedema, not elsewhere classified: Secondary | ICD-10-CM | POA: Diagnosis not present

## 2019-10-25 NOTE — Telephone Encounter (Signed)
He also needs to F/U with [facility] for venous insufficiency study and f/u with Dr. Donzetta Matters.  Okay to increase Torsemide to 40 mg am and 3 pm. He needs to stop eating his calories! JG

## 2019-10-25 NOTE — Progress Notes (Signed)
[name], [name] Visit Report for 10/25/2019 Arrival Information Details Patient Name: [name], [name]. Date of Service: 10/25/2019 8:45 AM Medical Record Number: [id number] Patient Account Number: [id number] Date of Birth/Sex: [birth year] (68 y.o. M) Treating RN: Montey Hora Primary Care Versa Craton: Aura Dials Other Clinician: Referring Rainn Bullinger: Aura Dials Treating Christon Gallaway/Extender: Melburn Hake, HOYT Weeks in Treatment: 4 Visit Information History Since Last Visit Added or deleted any medications: No Patient Arrived: Ambulatory Any new allergies or adverse reactions: No Arrival Time: 08:49 Had a fall or experienced change in No Accompanied By: self activities of daily living that may affect Transfer Assistance: None risk of falls: Patient Identification Verified: Yes Signs or symptoms of abuse/neglect since last visito No Secondary Verification Process Yes Hospitalized since last visit: No Completed: Implantable device outside of the clinic excluding No Patient Requires Transmission-Based No cellular tissue based products placed in the center Precautions: since last visit: Patient Has Alerts: Yes Has Dressing in Place as Prescribed: Yes Patient Alerts: DM II Has Compression in Place as Prescribed: Yes ABI11/18/20 L 1.25 R Pain Present Now: No 1.19 Electronic Signature(s) Signed: 10/25/2019 3:25:11 PM By: Montey Hora Entered By: Montey Hora on 10/25/2019 08:50:47 Troia, [name] -------------------------------------------------------------------------------- Compression Therapy Details Patient Name: [name]. Date of Service: 10/25/2019 8:45 AM Medical Record Number: [id number] Patient Account Number: [id number] Date of Birth/Sex: 1950-12-17 (68 y.o. M) Treating RN: Army Melia Primary Care Vanette Noguchi: Aura Dials Other Clinician: Referring Alyus Mofield: Aura Dials Treating Aubreanna Percle/Extender: Melburn Hake, HOYT Weeks  in Treatment: 4 Compression Therapy Performed for Wound Assessment: NonWound Condition Lymphedema - Bilateral Leg Performed By: Clinician Army Melia, RN Compression Type: Four Layer Pre Treatment ABI: 1.2 Post Procedure Diagnosis Same as Pre-procedure Electronic Signature(s) Signed: 10/25/2019 11:33:33 AM By: Army Melia Entered By: Army Melia on 10/25/2019 09:27:38 Peach, [name] -------------------------------------------------------------------------------- Encounter Discharge Information Details Patient Name: [name]. Date of Service: 10/25/2019 8:45 AM Medical Record Number: [id number] Patient Account Number: [id number] Date of Birth/Sex: 1951-11-14 (68 y.o. M) Treating RN: Army Melia Primary Care Sami Froh: Aura Dials Other Clinician: Referring Daxx Tiggs: Aura Dials Treating Sherri Mcarthy/Extender: Melburn Hake, HOYT Weeks in Treatment: 4 Encounter Discharge Information Items Discharge Condition: Stable Ambulatory Status: Ambulatory Discharge Destination: Home Transportation: Private Auto Accompanied By: self Schedule Follow-up Appointment: Yes Clinical Summary of Care: Electronic Signature(s) Signed: 10/25/2019 11:33:33 AM By: Army Melia Entered By: Army Melia on 10/25/2019 09:29:42 [name], [name] -------------------------------------------------------------------------------- Lower Extremity Assessment Details Patient Name: [name]. Date of Service: 10/25/2019 8:45 AM Medical Record Number: [id number] Patient Account Number: [id number] Date of Birth/Sex: Oct 22, 1951 (68 y.o. M) Treating RN: Montey Hora Primary Care Reynaldo Rossman: Aura Dials Other Clinician: Referring Mabeline Varas: Aura Dials Treating Xerxes Agrusa/Extender: STONE III, HOYT Weeks in Treatment: 4 Edema Assessment Assessed: [Left: No] [Right: No] Edema: [Left: Ye] [Right: s] Calf Left: Right: Point of Measurement: 36 cm From Medial Instep 52 cm  cm Ankle Left: Right: Point of Measurement: 12 cm From Medial Instep 34 cm cm Vascular Assessment Pulses: Dorsalis Pedis Palpable: [Left:Yes] Electronic Signature(s) Signed: 10/25/2019 3:25:11 PM By: Montey Hora Entered By: Montey Hora on 10/25/2019 08:57:46 [name], [name] -------------------------------------------------------------------------------- Multi Wound Chart Details Patient Name: [name]. Date of Service: 10/25/2019 8:45 AM Medical Record Number: [id number] Patient Account Number: [id number] Date of Birth/Sex: [birth year] (68 y.o. M) Treating RN: Army Melia Primary Care Kalisi Bevill: Aura Dials Other Clinician: Referring Patrica Mendell: Aura Dials Treating Asaph Serena/Extender: STONE III, HOYT Weeks in Treatment: 4 Vital Signs Height(in): 69 Pulse(bpm):  40 Weight(lbs): 295 Blood Pressure(mmHg): 158/63 Body Mass Index(BMI): 44 Temperature(F): 97.9 Respiratory Rate 18 (breaths/min): Wound Assessments Treatment Notes Electronic Signature(s) Signed: 10/25/2019 11:33:33 AM By: Army Melia Entered By: Army Melia on 10/25/2019 09:19:54 [name], [name] -------------------------------------------------------------------------------- [place] Details Patient Name: [name]. Date of Service: 10/25/2019 8:45 AM Medical Record Number: [id number] Patient Account Number: [id number] Date of Birth/Sex: September 11, 1951 (68 y.o. M) Treating RN: Army Melia Primary Care Ginni Eichler: Aura Dials Other Clinician: Referring Donold Marotto: Aura Dials Treating Boss Danielsen/Extender: Melburn Hake, HOYT Weeks in Treatment: 4 Active Inactive Nutrition Nursing Diagnoses: Impaired glucose control: actual or potential Goals: Patient/caregiver verbalizes understanding of need to maintain therapeutic glucose control per primary care physician Date Initiated: 09/27/2019 Target Resolution Date: 10/19/2019 Goal Status:  Active Interventions: Assess HgA1c results as ordered upon admission and as needed Notes: Orientation to the Wound Care Program Nursing Diagnoses: Knowledge deficit related to the wound healing center program Goals: Patient/caregiver will verbalize understanding of the [facility] Date Initiated: 09/27/2019 Target Resolution Date: 10/19/2019 Goal Status: Active Interventions: Provide education on orientation to the wound center Notes: Wound/Skin Impairment Nursing Diagnoses: Impaired tissue integrity Goals: Ulcer/skin breakdown will have a volume reduction of 30% by week 4 Date Initiated: 09/27/2019 Target Resolution Date: 10/25/2019 Goal Status: Active Interventions: Assess ulceration(s) every visit [name], [name] Notes: Electronic Signature(s) Signed: 10/25/2019 11:33:33 AM By: Army Melia Entered By: Army Melia on 10/25/2019 09:19:45 [name], [name] ([id number]) -------------------------------------------------------------------------------- Non-Wound Condition Assessment Details Patient Name: [name]. Date of Service: 10/25/2019 8:45 AM Medical Record Number: [id number] Patient Account Number: [id number] Date of Birth/Sex: 06-28-1951 (68 y.o. M) Treating RN: Montey Hora Primary Care Ed Rayson: Aura Dials Other Clinician: Referring Uzziel Russey: Aura Dials Treating Mercia Dowe/Extender: STONE III, HOYT Weeks in Treatment: 4 Non-Wound Condition: Condition: Lymphedema Location: Leg Side: Bilateral Photos Electronic Signature(s) Signed: 10/25/2019 3:25:11 PM By: Montey Hora Entered By: Montey Hora on 10/25/2019 08:58:33 [name], [name] -------------------------------------------------------------------------------- Pain Assessment Details Patient Name: [name]. Date of Service: 10/25/2019 8:45 AM Medical Record Number: [id number] Patient Account Number: [id number] Date of Birth/Sex:  Oct 17, 1951 (68 y.o. M) Treating RN: Montey Hora Primary Care Aveena Bari: Aura Dials Other Clinician: Referring Tiziana Cislo: Aura Dials Treating Ronnita Paz/Extender: Melburn Hake, HOYT Weeks in Treatment: 4 Active Problems Location of Pain Severity and Description of Pain Patient Has Paino Yes Site Locations Pain Location: Generalized Pain With Dressing Change: Yes Duration of the Pain. Constant / Intermittento Constant Character of Pain Describe the Pain: Burning Pain Management and Medication Current Pain Management: Electronic Signature(s) Signed: 10/25/2019 3:25:11 PM By: Montey Hora Entered By: Montey Hora on 10/25/2019 08:51:36 [name], [name] -------------------------------------------------------------------------------- Patient/Caregiver Education Details Patient Name: [name]. Date of Service: 10/25/2019 8:45 AM Medical Record Number: [id number] Patient Account Number: [id number] Date of Birth/Gender: 1951-08-13 (68 y.o. M) Treating RN: Army Melia Primary Care Physician: Aura Dials Other Clinician: Referring Physician: Aura Dials Treating Physician/Extender: Sharalyn Ink in Treatment: 4 Education Assessment Education Provided To: Patient Education Topics Provided Wound/Skin Impairment: Handouts: Caring for Your Ulcer Methods: Demonstration, Explain/Verbal Responses: State content correctly Electronic Signature(s) Signed: 10/25/2019 11:33:33 AM By: Army Melia Entered By: Army Melia on 10/25/2019 09:28:52 [name], [name] -------------------------------------------------------------------------------- Vitals Details Patient Name: [name]. Date of Service: 10/25/2019 8:45 AM Medical Record Number: [id number] Patient Account Number: [id number] Date of Birth/Sex: 1951-01-17 (68 y.o. M) Treating RN: Montey Hora Primary Care Krisy Dix: Aura Dials Other Clinician: Referring Umaima Scholten:  Aura Dials Treating  Aquan Kope/Extender: Melburn Hake, HOYT Weeks in Treatment: 4 Vital Signs Time Taken: 08:52 Temperature (F): 97.9 Height (in): 69 Pulse (bpm): 40 Weight (lbs): 295 Respiratory Rate (breaths/min): 18 Body Mass Index (BMI): 43.6 Blood Pressure (mmHg): 158/63 Reference Range: 80 - 120 mg / dl Electronic Signature(s) Signed: 10/25/2019 3:25:11 PM By: Montey Hora Entered By: Montey Hora on 10/25/2019 08:53:44

## 2019-10-25 NOTE — Progress Notes (Addendum)
KEVAN, SIBBALD ([id number]) Visit Report for 10/25/2019 Chief Complaint Document Details Patient Name: [name], [name]. Date of Service: 10/25/2019 8:45 AM Medical Record Number: [id number] Patient Account Number: [id number] Date of Birth/Sex: May 11, 1951 (68 y.o. M) Treating RN: Army Melia Primary Care Provider: Aura Dials Other Clinician: Referring Provider: Aura Dials Treating Provider/Extender: Melburn Hake, HOYT Weeks in Treatment: 4 Information Obtained from: Patient Chief Complaint Bilateral LE lymphedema left leg cellulitis Electronic Signature(s) Signed: 10/25/2019 9:02:24 AM By: Worthy Keeler PA-C Entered By: Worthy Keeler on 10/25/2019 09:02:23 Hisle, [name] ([id number]) -------------------------------------------------------------------------------- HPI Details Patient Name: [name]. Date of Service: 10/25/2019 8:45 AM Medical Record Number: [id number] Patient Account Number: [id number] Date of Birth/Sex: [birth year] (68 y.o. M) Treating RN: Army Melia Primary Care Provider: Aura Dials Other Clinician: Referring Provider: Aura Dials Treating Provider/Extender: Melburn Hake, HOYT Weeks in Treatment: 4 History of Present Illness HPI Description: 09/27/2019 on evaluation today patient appears to be doing poorly in regard to his bilateral lower extremities he does have bilateral lower extremity lymphedema. With that being said he also has a history of diabetes type 2, hypertension, congestive heart failure, and apparently has chronic venous insufficiency which has led to the lymphedema. Currently he also shows signs of an infection of the left lower extremity which does have me concerned at this point as well. He tells me that he is not really having any significant pain but has had a lot of trouble with his legs in general. He does have a compression stocking he is wearing on the right lower extremity he was wrapped with an Unna boot on the  left lower extremity. The patient tells me that overall he has been doing with this for several weeks and home health has been initiated. He has not been on any antibiotics yet and he tells me that the home health nurse was not sure that it was infected based on what I see today I am concerned that this likely is infected at this point. Fortunately however there does not appear to be any evidence of systemic infection which is good news. The Unna boot does seem to be helping control the edema although he may benefit from a stronger compression wrap I think that at this point without having the ABIs completed the Unna boot is where I would stand. He is supposed to be having an arterial study with ABI as ordered by his cardiologist but he states he may have missed that appointment and has not called to reschedule he needs to contact them to get this rescheduled as soon as possible I explained to him. He tells me that he is draining a lot as far as the left lower extremity is concerned and that is something that he feels like is not lasting very long as far as the wraps are concerned when they put him on they seem to get wet extremely quickly. 10/04/2019 on evaluation today patient appears to be doing better compared to last week's evaluation. Fortunately there is no signs of active infection at this time. He has been tolerating the dressing changes without complication. He did undergo arterial studies yesterday and has an appointment tomorrow with the vascular specialist. With that being said his ABI on the left was 1.25 on the right 1.19 with a TBI of 0.61 on the left and 0.69 on the right. There was stated to be no evidence of obvious significant arterial disease and the patient had pretty much triphasic blood flow throughout. Overall  I am very pleased with how things seem to be progressing. We will see if the vascular doctor says anything different tomorrow but I even at this point I feel like he  would do well with a stronger compression wrap to try to get some the fluid out of his leg at this point. The good news is his drainage has slowed down considerably I do believe the antibiotics have been beneficial for him. 10/18/2019 on evaluation today patient appears to be doing poorly in regard to his left lower extremity. He still having a lot of drainage and weeping from his leg this appears to be much more erythematous and in fact I am concerned that the erythema may be spreading up his leg as well. He has not experienced any fevers, chills, nausea, vomiting, or diarrhea up to this point. I explained to him that I really feel like based on what I am seeing he may need to potentially go to the hospital for evaluation and treatment potentially even with IV antibiotics depending on what they see. With that being said he tells me that he is not really able to do that due to the fact that he is the primary caregiver for his wife who is legally blind. He states he is not good to be able to have anything to help as far as caring for her if he were to go into the hospital. Nonetheless he is unsure what to do in this regard. I really think that he needs faster treatment. For that reason I am going to likely try to get in touch with the infectious disease office to see if I can get him in sooner than [place]. 10/25/2019 on evaluation today patient appears to be doing a little better in regard to his weeping and edema compared to the last time I saw him. The dorsal surface of his foot is a little bit drier compared to what was this is good news. He did see Dr. Drucilla Schmidt on 10/22/2019. Dr. Drucilla Schmidt did feel that potentially this could be secondary to some cellulitis and he recommended subsequently placing the patient on Zyvox which the patient feels like has helped as well. There does not appear to be any signs of active infection at this time which is good news systemically. I am very pleased that Dr.  Drucilla Schmidt was able to see him so quickly I do appreciate this greatly. With that being said he also did recommend an MRI for the patient to evaluate for any deeper signs of infection. That is actually scheduled for this coming Tuesday. He also has a follow-up appoint with Dr. Drucilla Schmidt on Tuesday. The last thing Dr. Drucilla Schmidt did mention was the possibility of more aggressive diuresis. The patient is on torsemide. With that being said this is managed by his cardiologist. I think we may want to get in touch with her clinic and see if possibly this is something that could be increased for a short amount of time depending on their MIHAI, VERZOSA. ([id number]) recommendations to see if we can get some additional fluid off of him which in turn would likely help tremendously with his weeping and edema. He is in agreement with this plan if his cardiologist is in agreement. Electronic Signature(s) Signed: 10/25/2019 9:34:09 AM By: Worthy Keeler PA-C Entered By: Worthy Keeler on 10/25/2019 09:34:09 Donaway, [name] ([id number]) -------------------------------------------------------------------------------- Physical Exam Details Patient Name: SHAHRAM, FREW. Date of Service: 10/25/2019 8:45 AM Medical Record Number: [id number] Patient Account Number:  [id number] Date of Birth/Sex: 04/24/1951 (68 y.o. M) Treating RN: Army Melia Primary Care Provider: Aura Dials Other Clinician: Referring Provider: Aura Dials Treating Provider/Extender: STONE III, HOYT Weeks in Treatment: 4 Constitutional Well-nourished and well-hydrated in no acute distress. Respiratory normal breathing without difficulty. clear to auscultation bilaterally. Cardiovascular regular rate and rhythm with normal S1, S2. Psychiatric this patient is able to make decisions and demonstrates good insight into disease process. Alert and Oriented x 3. pleasant and cooperative. Notes Patient's wound bed currently showed signs of  moisture associated skin breakdown in general over the left lower extremity secondary to lymphedema. He is having a tremendous amount of weeping which is not helping anything and in fact likely compounding things. With that being said he would possibly benefit from more aggressive treatment in the realm of diuresis. He is on Zyvox as prescribed by Dr. Drucilla Schmidt at this point which I do appreciate again that evaluation and opinion. Electronic Signature(s) Signed: 10/25/2019 9:34:55 AM By: Worthy Keeler PA-C Entered By: Worthy Keeler on 10/25/2019 09:34:55 Deason, [name] ([id number]) -------------------------------------------------------------------------------- Physician Orders Details Patient Name: [name]. Date of Service: 10/25/2019 8:45 AM Medical Record Number: [id number] Patient Account Number: [id number] Date of Birth/Sex: 1951-06-09 (68 y.o. M) Treating RN: Army Melia Primary Care Provider: Aura Dials Other Clinician: Referring Provider: Aura Dials Treating Provider/Extender: Melburn Hake, HOYT Weeks in Treatment: 4 Verbal / Phone Orders: No Diagnosis Coding ICD-10 Coding Code Description I89.0 Lymphedema, not elsewhere classified I87.2 Venous insufficiency (chronic) (peripheral) E11.628 Type 2 diabetes mellitus with other skin complications 99991111 Essential (primary) hypertension I50.42 Chronic combined systolic (congestive) and diastolic (congestive) heart failure L02.416 Cutaneous abscess of left lower limb Wound Cleansing o Clean wound with Normal Saline. - in office o Cleanse wound with mild soap and water - left leg Primary Wound Dressing o Silver Alginate - over weeping areas Secondary Dressing o XtraSorb Dressing Change Frequency o Dressing is to be changed Monday and Thursday. - Monday by [facility] Follow-up Appointments o Return Appointment in 1 week. Edema Control o 4-Layer Compression System - Left Lower Extremity. o Other: - Unna  to anchor [place] Visits - [place] Nurse may visit PRN to address patientos wound care needs. o FACE TO FACE ENCOUNTER: MEDICARE and MEDICAID PATIENTS: I certify that this patient is under my care and that I had a face-to-face encounter that meets the physician face-to-face encounter requirements with this patient on this date. The encounter with the patient was in whole or in part for the following MEDICAL CONDITION: (primary reason for [place]) MEDICAL NECESSITY: I certify, that based on my findings, NURSING services are a medically necessary home health service. HOME BOUND STATUS: I certify that my clinical findings support that this patient is homebound (i.e., Due to illness or injury, pt requires aid of supportive devices such as crutches, cane, wheelchairs, walkers, the use of special transportation or the assistance of another person to leave their place of residence. There is a normal inability to leave the home and doing so requires considerable and taxing effort. Other absences are for medical reasons / religious services and are infrequent or of short duration when for other reasons). o If current dressing causes regression in wound condition, may D/C ordered dressing product/s and apply Normal Saline Moist Dressing daily until next [place] / Other MD appointment. [place] of regression in wound condition at [phone]. o Please direct any NON-WOUND related  issues/requests for orders to patient's Primary Care Physician ABEER, AMORIN ([id number]) Electronic Signature(s) Signed: 10/25/2019 11:33:33 AM By: Army Melia Signed: 10/25/2019 6:18:38 PM By: Worthy Keeler PA-C Entered By: Army Melia on 10/25/2019 09:28:16 Egle, [name] ([id number]) -------------------------------------------------------------------------------- Problem List Details Patient Name: HARLON, WILLETTS. Date of Service: 10/25/2019 8:45 AM Medical Record Number: [id number] Patient Account Number: [id number] Date of Birth/Sex: December 12, 1950 (68 y.o. M) Treating RN: Army Melia Primary Care Provider: Aura Dials Other Clinician: Referring Provider: Aura Dials Treating Provider/Extender: Melburn Hake, HOYT Weeks in Treatment: 4 Active Problems ICD-10 Evaluated Encounter Code Description Active Date Today Diagnosis I89.0 Lymphedema, not elsewhere classified 09/27/2019 No Yes I87.2 Venous insufficiency (chronic) (peripheral) 09/27/2019 No Yes E11.628 Type 2 diabetes mellitus with other skin complications 123XX123 No Yes I10 Essential (primary) hypertension 09/27/2019 No Yes I50.42 Chronic combined systolic (congestive) and diastolic 123XX123 No Yes (congestive) heart failure L02.416 Cutaneous abscess of left lower limb 09/27/2019 No Yes Inactive Problems Resolved Problems Electronic Signature(s) Signed: 10/25/2019 9:02:16 AM By: Worthy Keeler PA-C Entered By: Worthy Keeler on 10/25/2019 09:02:16 Canela, [name] ([id number]) -------------------------------------------------------------------------------- Progress Note Details Patient Name: [name]. Date of Service: 10/25/2019 8:45 AM Medical Record Number: [id number] Patient Account Number: [id number] Date of Birth/Sex: 11-23-1950 (68 y.o. M) Treating RN: Army Melia Primary Care Provider: Aura Dials Other Clinician: Referring Provider: Aura Dials Treating Provider/Extender: Melburn Hake, HOYT Weeks in Treatment: 4 Subjective Chief Complaint Information obtained from Patient Bilateral LE lymphedema left leg cellulitis History of Present Illness (HPI) 09/27/2019 on evaluation today patient appears to be doing poorly in regard to his bilateral lower extremities he does have bilateral lower extremity lymphedema. With that being said he also has a history of diabetes type 2, hypertension, congestive heart  failure, and apparently has chronic venous insufficiency which has led to the lymphedema. Currently he also shows signs of an infection of the left lower extremity which does have me concerned at this point as well. He tells me that he is not really having any significant pain but has had a lot of trouble with his legs in general. He does have a compression stocking he is wearing on the right lower extremity he was wrapped with an Unna boot on the left lower extremity. The patient tells me that overall he has been doing with this for several weeks and home health has been initiated. He has not been on any antibiotics yet and he tells me that the home health nurse was not sure that it was infected based on what I see today I am concerned that this likely is infected at this point. Fortunately however there does not appear to be any evidence of systemic infection which is good news. The Unna boot does seem to be helping control the edema although he may benefit from a stronger compression wrap I think that at this point without having the ABIs completed the Unna boot is where I would stand. He is supposed to be having an arterial study with ABI as ordered by his cardiologist but he states he may have missed that appointment and has not called to reschedule he needs to contact them to get this rescheduled as soon as possible I explained to him. He tells me that he is draining a lot as far as the left lower extremity is concerned and that is something that he feels like is not lasting very long as far as the wraps are concerned when they put him on  they seem to get wet extremely quickly. 10/04/2019 on evaluation today patient appears to be doing better compared to last week's evaluation. Fortunately there is no signs of active infection at this time. He has been tolerating the dressing changes without complication. He did undergo arterial studies yesterday and has an appointment tomorrow with the  vascular specialist. With that being said his ABI on the left was 1.25 on the right 1.19 with a TBI of 0.61 on the left and 0.69 on the right. There was stated to be no evidence of obvious significant arterial disease and the patient had pretty much triphasic blood flow throughout. Overall I am very pleased with how things seem to be progressing. We will see if the vascular doctor says anything different tomorrow but I even at this point I feel like he would do well with a stronger compression wrap to try to get some the fluid out of his leg at this point. The good news is his drainage has slowed down considerably I do believe the antibiotics have been beneficial for him. 10/18/2019 on evaluation today patient appears to be doing poorly in regard to his left lower extremity. He still having a lot of drainage and weeping from his leg this appears to be much more erythematous and in fact I am concerned that the erythema may be spreading up his leg as well. He has not experienced any fevers, chills, nausea, vomiting, or diarrhea up to this point. I explained to him that I really feel like based on what I am seeing he may need to potentially go to the hospital for evaluation and treatment potentially even with IV antibiotics depending on what they see. With that being said he tells me that he is not really able to do that due to the fact that he is the primary caregiver for his wife who is legally blind. He states he is not good to be able to have anything to help as far as caring for her if he were to go into the hospital. Nonetheless he is unsure what to do in this regard. I really think that he needs faster treatment. For that reason I am going to likely try to get in touch with the infectious disease office to see if I can get him in sooner than [place]. 10/25/2019 on evaluation today patient appears to be doing a little better in regard to his weeping and edema compared to the last time I saw him.  The dorsal surface of his foot is a little bit drier compared to what was this is good news. He did see Dr. Drucilla Schmidt on 10/22/2019. Dr. Drucilla Schmidt did feel that potentially this could be secondary to some cellulitis and he recommended subsequently placing the patient on Zyvox which the patient feels like has helped as well. There does not appear to be any signs of active infection at this time which is good news systemically. I am very pleased that Dr. Luther Parody, [facility] M. ([id number]) was able to see him so quickly I do appreciate this greatly. With that being said he also did recommend an MRI for the patient to evaluate for any deeper signs of infection. That is actually scheduled for this coming Tuesday. He also has a follow-up appoint with Dr. Drucilla Schmidt on Tuesday. The last thing Dr. Drucilla Schmidt did mention was the possibility of more aggressive diuresis. The patient is on torsemide. With that being said this is managed by his cardiologist. I think we may want to get  in touch with her clinic and see if possibly this is something that could be increased for a short amount of time depending on their recommendations to see if we can get some additional fluid off of him which in turn would likely help tremendously with his weeping and edema. He is in agreement with this plan if his cardiologist is in agreement. Patient History Information obtained from Patient. Family History Cancer - Paternal Grandparents, Diabetes - Mother, No family history of Heart Disease, Hypertension, Kidney Disease, Lung Disease, Seizures, Stroke, Thyroid Problems, Tuberculosis. Social History Former smoker - 31 years ago, Marital Status - Married, Alcohol Use - Rarely, Drug Use - No History, Caffeine Use - Daily. Medical History Cardiovascular Patient has history of Congestive Heart Failure Endocrine Patient has history of Type II Diabetes Musculoskeletal Patient has history of Osteoarthritis - Hands Review of Systems  (ROS) Constitutional Symptoms (General Health) Denies complaints or symptoms of Fatigue, Fever, Chills, Marked Weight Change. Respiratory Denies complaints or symptoms of Chronic or frequent coughs, Shortness of Breath. Cardiovascular Complains or has symptoms of LE edema. Denies complaints or symptoms of Chest pain. Psychiatric Denies complaints or symptoms of Anxiety, Claustrophobia. Objective Constitutional Well-nourished and well-hydrated in no acute distress. Vitals Time Taken: 8:52 AM, Height: 69 in, Weight: 295 lbs, BMI: 43.6, Temperature: 97.9 F, Pulse: 40 bpm, Respiratory Rate: 18 breaths/min, Blood Pressure: 158/63 mmHg. Respiratory normal breathing without difficulty. clear to auscultation bilaterally. [name] Cardiovascular regular rate and rhythm with normal S1, S2. Psychiatric this patient is able to make decisions and demonstrates good insight into disease process. Alert and Oriented x 3. pleasant and cooperative. General Notes: Patient's wound bed currently showed signs of moisture associated skin breakdown in general over the left lower extremity secondary to lymphedema. He is having a tremendous amount of weeping which is not helping anything and in fact likely compounding things. With that being said he would possibly benefit from more aggressive treatment in the realm of diuresis. He is on Zyvox as prescribed by Dr. Drucilla Schmidt at this point which I do appreciate again that evaluation and opinion. Other Condition(s) Patient presents with Lymphedema located on the Bilateral Leg. Assessment Active Problems ICD-10 Lymphedema, not elsewhere classified Venous insufficiency (chronic) (peripheral) Type 2 diabetes mellitus with other skin complications Essential (primary) hypertension Chronic combined systolic (congestive) and diastolic (congestive) heart failure Cutaneous abscess of left lower limb Procedures There was a Four Layer Compression  Therapy Procedure with a pre-treatment ABI of 1.2 by Army Melia, RN. Post procedure Diagnosis Wound #: Same as Pre-Procedure Plan Wound Cleansing: Clean wound with Normal Saline. - in office Cleanse wound with mild soap and water - left leg Primary Wound Dressing: Silver Alginate - over weeping areas Secondary Dressing: XtraSorb Dressing Change Frequency: Dressing is to be changed Monday and Thursday. - Monday by [facility] Follow-up Appointments: [name] Return Appointment in 1 week. Edema Control: 4-Layer Compression System - Left Lower Extremity. Other: Louretta Parma to anchor wrap Home Health: [place] Nurse may visit PRN to address patient s wound care needs. FACE TO FACE ENCOUNTER: MEDICARE and MEDICAID PATIENTS: I certify that this patient is under my care and that I had a face-to-face encounter that meets the physician face-to-face encounter requirements with this patient on this date. The encounter with the patient was in whole or in part for the following MEDICAL CONDITION: (primary reason for [place]) MEDICAL NECESSITY: I certify, that based on my  findings, NURSING services are a medically necessary home health service. HOME BOUND STATUS: I certify that my clinical findings support that this patient is homebound (i.e., Due to illness or injury, pt requires aid of supportive devices such as crutches, cane, wheelchairs, walkers, the use of special transportation or the assistance of another person to leave their place of residence. There is a normal inability to leave the home and doing so requires considerable and taxing effort. Other absences are for medical reasons / religious services and are infrequent or of short duration when for other reasons). If current dressing causes regression in wound condition, may D/C ordered dressing product/s and apply Normal Saline Moist Dressing daily until next [place]  / Other MD appointment. [place] of regression in wound condition at [phone]. Please direct any NON-WOUND related issues/requests for orders to patient's Primary Care Physician 1. My suggestion at this point is can be that we going to continue with the 4 layer compression wrapping in a suggest as well we continue with the silver alginate and then subsequently around the ankle and lower extremity area we use the Xtrasorb to help with catching the extra drainage occurring at this point. 2. I am also going to suggest based on what I am seeing at this point that we go ahead and have the patient continue with the Zyvox as prescribed by Dr. Drucilla Schmidt I think this is still an excellent option. We will also see what he feels based on the results of all the lab work and MRI that was ordered when the patient saw him on Monday. All this should be reviewed with the patient on Tuesday he has an MRI on Tuesday as well. 3. With regard to edema control also think the patient needs to elevate his legs as much as possible to help with this. With regard to the torsemide I think that the patient could potentially increase this in order to help with additional diuresis. With that being said I really would not want to make this change without consulting with his cardiologist we will get a try to give their office a call and see if we can get directions for what the patient should do. I did advise the patient that either our clinic or their clinic will contact him with directions once we have that conversation. We will see patient back for reevaluation in 1 week here in the clinic. If anything worsens or changes patient will contact our office for additional recommendations. Electronic Signature(s) Signed: 10/25/2019 9:36:28 AM By: Worthy Keeler PA-C Entered By: Worthy Keeler on 10/25/2019 09:36:28 Debruin, [name]  ([id number]) -------------------------------------------------------------------------------- ROS/PFSH Details Patient Name: [name]. Date of Service: 10/25/2019 8:45 AM Medical Record Number: [id number] Patient Account Number: [id number] Date of Birth/Sex: January 12, 1951 (68 y.o. M) Treating RN: Army Melia Primary Care Provider: Aura Dials Other Clinician: Referring Provider: Aura Dials Treating Provider/Extender: Melburn Hake, HOYT Weeks in Treatment: 4 Information Obtained From Patient Constitutional Symptoms (General Health) Complaints and Symptoms: Negative for: Fatigue; Fever; Chills; Marked Weight Change Respiratory Complaints and Symptoms: Negative for: Chronic or frequent coughs; Shortness of Breath Cardiovascular Complaints and Symptoms: Positive for: LE edema Negative for: Chest pain Medical History: Positive for: Congestive Heart Failure Psychiatric Complaints and Symptoms: Negative for: Anxiety; Claustrophobia Endocrine Medical History: Positive for: Type II Diabetes Time with diabetes: 1 year Treated with: Oral agents Blood sugar tested every day: No Musculoskeletal Medical History: Positive for: Osteoarthritis - Hands Immunizations Pneumococcal Vaccine: Received  Pneumococcal Vaccination: No Implantable Devices None Family and Social History CABE, MOSKWA ([id number]) Cancer: Yes - Paternal Grandparents; Diabetes: Yes - Mother; Heart Disease: No; Hypertension: No; Kidney Disease: No; Lung Disease: No; Seizures: No; Stroke: No; Thyroid Problems: No; Tuberculosis: No; Former smoker - 51 years ago; Marital Status - Married; Alcohol Use: Rarely; Drug Use: No History; Caffeine Use: Daily Physician Affirmation I have reviewed and agree with the above information. Electronic Signature(s) Signed: 10/25/2019 11:33:33 AM By: Army Melia Signed: 10/25/2019 6:18:38 PM By: Worthy Keeler PA-C Entered By: Worthy Keeler on 10/25/2019  09:34:34 [place], [name] ([id number]) -------------------------------------------------------------------------------- SuperBill Details Patient Name: [name]. Date of Service: 10/25/2019 Medical Record Number: [id number] Patient Account Number: [id number] Date of Birth/Sex: 1951/03/08 (68 y.o. M) Treating RN: Army Melia Primary Care Provider: Aura Dials Other Clinician: Referring Provider: Aura Dials Treating Provider/Extender: Melburn Hake, HOYT Weeks in Treatment: 4 Diagnosis Coding ICD-10 Codes Code Description I89.0 Lymphedema, not elsewhere classified I87.2 Venous insufficiency (chronic) (peripheral) E11.628 Type 2 diabetes mellitus with other skin complications 99991111 Essential (primary) hypertension I50.42 Chronic combined systolic (congestive) and diastolic (congestive) heart failure L02.416 Cutaneous abscess of left lower limb Facility Procedures CPT4 Code: [id number] Description: (Facility Use Only) 217 [id number] - [place] [facility] LT LEG Modifier: Quantity: 1 Physician Procedures CPT4 Code: [id number] Description: 99214 - WC PHYS LEVEL 4 - EST PT ICD-10 Diagnosis Description I89.0 Lymphedema, not elsewhere classified I87.2 Venous insufficiency (chronic) (peripheral) E11.628 Type 2 diabetes mellitus with other skin complications 99991111 Essential (primary)  hypertension Modifier: Quantity: 1 Electronic Signature(s) Signed: 10/25/2019 9:36:40 AM By: Worthy Keeler PA-C Entered By: Worthy Keeler on 10/25/2019 09:36:40

## 2019-10-26 ENCOUNTER — Ambulatory Visit (HOSPITAL_COMMUNITY)
Admission: RE | Admit: 2019-10-26 | Discharge: 2019-10-26 | Disposition: A | Discharge status: Home / Self Care | Payer: Medicare Other | Source: Ambulatory Visit | Attending: Family | Admitting: Family

## 2019-10-26 ENCOUNTER — Encounter: Payer: Self-pay | Admitting: Family

## 2019-10-26 ENCOUNTER — Ambulatory Visit (INDEPENDENT_AMBULATORY_CARE_PROVIDER_SITE_OTHER): Payer: Medicare Other | Admitting: Family

## 2019-10-26 VITALS — BP 121/66 | HR 68 | Temp 97.5°F | Resp 18 | Ht 69.0 in | Wt 299.0 lb

## 2019-10-26 DIAGNOSIS — I872 Venous insufficiency (chronic) (peripheral): Secondary | ICD-10-CM | POA: Insufficient documentation

## 2019-10-26 NOTE — Patient Instructions (Addendum)
To decrease swelling in your feet and legs: Elevate feet above slightly bent knees, feet above heart, overnight and 3-4 times per day for 20 minutes.    Chronic Venous Insufficiency Chronic venous insufficiency is a condition where the leg veins cannot effectively pump blood from the legs to the heart. This happens when the vein walls are either stretched, weakened, or damaged, or when the valves inside the vein are damaged. With the right treatment, you should be able to continue with an active life. This condition is also called venous stasis. What are the causes? Common causes of this condition include:  High blood pressure inside the veins (venous hypertension).  Sitting or standing too long, causing increased blood pressure in the leg veins.  A blood clot that blocks blood flow in a vein (deep vein thrombosis, DVT).  Inflammation of a vein (phlebitis) that causes a blood clot to form.  Tumors in the pelvis that cause blood to back up. What increases the risk? The following factors may make you more likely to develop this condition:  Having a family history of this condition.  Obesity.  Pregnancy.  Living without enough regular physical activity or exercise (sedentary lifestyle).  Smoking.  Having a job that requires long periods of standing or sitting in one place.  Being a certain age. Women in their 56s and 20s and men in their 16s are more likely to develop this condition. What are the signs or symptoms? Symptoms of this condition include:  Veins that are enlarged, bulging, or twisted (varicose veins).  Skin breakdown or ulcers.  Reddened skin or dark discoloration of skin on the leg between the knee and ankle.  Brown, smooth, tight, and painful skin just above the ankle, usually on the inside of the leg (lipodermatosclerosis).  Swelling of the legs. How is this diagnosed? This condition may be diagnosed based on:  Your medical history.  A physical exam.   Tests, such as: ? A procedure that creates an image of a blood vessel and nearby organs and provides information about blood flow through the blood vessel (duplex ultrasound). ? A procedure that tests blood flow (plethysmography). ? A procedure that looks at the veins using X-ray and dye (venogram). How is this treated? The goals of treatment are to help you return to an active life and to minimize pain or disability. Treatment depends on the severity of your condition, and it may include:  Wearing compression stockings. These can help relieve symptoms and help prevent your condition from getting worse. However, they do not cure the condition.  Sclerotherapy. This procedure involves an injection of a solution that shrinks damaged veins.  Surgery. This may involve: ? Removing a diseased vein (vein stripping). ? Cutting off blood flow through the vein (laser ablation surgery). ? Repairing or reconstructing a valve within the affected vein. Follow these instructions at home:      Wear compression stockings as told by your health care provider. These stockings help to prevent blood clots and reduce swelling in your legs.  Take over-the-counter and prescription medicines only as told by your health care provider.  Stay active by exercising, walking, or doing different activities. Ask your health care provider what activities are safe for you and how much exercise you need.  Drink enough fluid to keep your urine pale yellow.  Do not use any products that contain nicotine or tobacco, such as cigarettes, e-cigarettes, and chewing tobacco. If you need help quitting, ask your health care provider.  Keep all follow-up visits as told by your health care provider. This is important. Contact a health care provider if you:  Have redness, swelling, or more pain in the affected area.  See a red streak or line that goes up or down from the affected area.  Have skin breakdown or skin loss in the  affected area, even if the breakdown is small.  Get an injury in the affected area. Get help right away if:  You get an injury and an open wound in the affected area.  You have: ? Severe pain that does not get better with medicine. ? Sudden numbness or weakness in the foot or ankle below the affected area. ? Trouble moving your foot or ankle. ? A fever. ? Worse or persistent symptoms. ? Chest pain. ? Shortness of breath. Summary  Chronic venous insufficiency is a condition where the leg veins cannot effectively pump blood from the legs to the heart.  Chronic venous insufficiency occurs when the vein walls become stretched, weakened, or damaged, or when valves within the vein are damaged.  Treatment depends on how severe your condition is. It often involves wearing compression stockings and may involve having a procedure.  Make sure you stay active by exercising, walking, or doing different activities. Ask your health care provider what activities are safe for you and how much exercise you need. This information is not intended to replace advice given to you by your health care provider. Make sure you discuss any questions you have with your health care provider. Document Released: 03/07/2007 Document Revised: 07/25/2018 Document Reviewed: 07/25/2018 Elsevier Patient Education  2020 [place].    Edema  Edema is when you have too much fluid in your body or under your skin. Edema may make your legs, feet, and ankles swell up. Swelling is also common in looser tissues, like around your eyes. This is a common condition. It gets more common as you get older. There are many possible causes of edema. Eating too much salt (sodium) and being on your feet or sitting for a long time can cause edema in your legs, feet, and ankles. Hot weather may make edema worse. Edema is usually painless. Your skin may look swollen or shiny. Follow these instructions at home:  Keep the swollen body part  raised (elevated) above the level of your heart when you are sitting or lying down.  Do not sit still or stand for a long time.  Do not wear tight clothes. Do not wear garters on your upper legs.  Exercise your legs. This can help the swelling go down.  Wear elastic bandages or support stockings as told by your doctor.  Eat a low-salt (low-sodium) diet to reduce fluid as told by your doctor.  Depending on the cause of your swelling, you may need to limit how much fluid you drink (fluid restriction).  Take over-the-counter and prescription medicines only as told by your doctor. Contact a doctor if:  Treatment is not working.  You have heart, liver, or kidney disease and have symptoms of edema.  You have sudden and unexplained weight gain. Get help right away if:  You have shortness of breath or chest pain.  You cannot breathe when you lie down.  You have pain, redness, or warmth in the swollen areas.  You have heart, liver, or kidney disease and get edema all of a sudden.  You have a fever and your symptoms get worse all of a sudden. Summary  Edema  is when you have too much fluid in your body or under your skin.  Edema may make your legs, feet, and ankles swell up. Swelling is also common in looser tissues, like around your eyes.  Raise (elevate) the swollen body part above the level of your heart when you are sitting or lying down.  Follow your doctor's instructions about diet and how much fluid you can drink (fluid restriction). This information is not intended to replace advice given to you by your health care provider. Make sure you discuss any questions you have with your health care provider. Document Released: 04/19/2008 Document Revised: 11/04/2017 Document Reviewed: 11/19/2016 Elsevier Patient Education  2020 [employer].

## 2019-10-26 NOTE — Progress Notes (Signed)
CC: follow up with lower extremity venous duplex, Venous Insufficiency of right leg  History of Present Illness  [name]. is a 68 y.o. (1951-08-06) male whom Dr. Donzetta Matters saw on initial evaluation on 10-05-19 with left lower extremity wound for which he has been seeing the wound care center in [place]. At that time he had been in compression stockings for at least 3 months.  He was wearing a modified Unna boot daily.    He has never had a blood clot or venous intervention in his bilateral lower extremities.  He is also wearing compression stockings of the right leg does not have any ulceration there.  He has been treated with antibiotics for cellulitis of the left lower extremity.  ABIs were performed prior to that visit. Chronic venous insufficiency may be multifactorial given obesity and reduced ejection fraction from previous echocardiogram.  ABIs were normal.  He was to continue Unna boot on the left leg.  Pt was to follow-up with venous reflux studies f the left LE in a few weeks.  Dr. Donzetta Matters discussed with pt at that visit the possible outcomes and possible need for intervention of his superficial venous reflux if that exist.  His leg was rewrapped with Unna boot that.  Pt returns today for venous reflux duplex.   He states that he saw Dr. Tommy Medal, [facility], early this week or last week, states he started pt on an antibiotic, doxycycline, which pt states he has been taking for 4 days and he states it seems like the cellulitis is improving, unna boot is in place, was placed yesterday at [facility]  Pt denies fever or chills, denies chest pain, states some mild DOE walking out to his car.   He states he does not elevate his legs when sitting, states his house is not set up for this. He states he will try to put his feet up on an ottoman.   DM: states his A1C was 7.4 Tobacco use: about 1 year in highschool. Occasionally smokes 3-4 cigars/year   Past Medical History:  Diagnosis Date  .  Arthritis   . Back pain    occasionally  . BPH (benign prostatic hypertrophy)    takes Uroxatral daily as well as Finasteride   . Diabetes mellitus without complication ([place])    takes Metformin daily  . Diabetic foot infection ([place]) 10/22/2019  . GERD (gastroesophageal reflux disease)    no meds  . Hepatitis hx of   at age 51  . Hypertension    takes Losartan and HCTZ daily  . Joint pain   . Lymphedema 10/22/2019  . Urinary frequency   . Urinary urgency     Social History Social History   Tobacco Use  . Smoking status: Never Smoker  . Smokeless tobacco: Former Network engineer Use Topics  . Alcohol use: Yes    Alcohol/week: 0.0 standard drinks    Comment: occasionally beer or scotch  . Drug use: No    Family History Family History  Problem Relation Age of Onset  . Benign prostatic hyperplasia Father   . Kidney cancer Neg Hx   . Kidney disease Neg Hx     Surgical History Past Surgical History:  Procedure Laterality Date  . APPENDECTOMY     age 70  . COLONOSCOPY    . KNEE ARTHROSCOPY  1984   right and left   . SHOULDER ARTHROSCOPY  2003   right RCR-GSC-stayed overnight-had SOB  . SHOULDER  ARTHROSCOPY WITH ROTATOR CUFF REPAIR AND SUBACROMIAL DECOMPRESSION Left 03/13/2013   Procedure: LEFT SHOULDER ARTHROSCOPY WITH SUBACROMIAL DECOMPRESSION, DISTAL CLAVICLE RESECTION AND REPAIR ROTATOR CUFF AND LABRAL DEBRIDEMENT;  Surgeon: [name]., MD;  Location: [facility];  Service: Orthopedics;  Laterality: Left;  . TOTAL HIP ARTHROPLASTY Right 01/29/2015   Procedure: TOTAL HIP ARTHROPLASTY ANTERIOR APPROACH;  Surgeon: [name], MD;  Location: [facility];  Service: Orthopedics;  Laterality: Right;  Marland Kitchen VASECTOMY    . VASECTOMY REVERSAL  1993    Allergies  Allergen Reactions  . Codeine Swelling    Water retention    Current Outpatient Medications  Medication Sig Dispense Refill  . Cholecalciferol (VITAMIN D3) 5000 UNITS TABS Take 1 tablet by mouth  daily. Reported on 04/29/2016    . doxycycline (VIBRAMYCIN) 100 MG capsule Take 100 mg by mouth 2 (two) times daily.    . finasteride (PROSCAR) 5 MG tablet Take 5 mg by mouth daily.    Marland Kitchen GABAPENTIN PO Take by mouth as needed.    Marland Kitchen glipiZIDE (GLUCOTROL XL) 5 MG 24 hr tablet Take 5 mg by mouth 3 (three) times daily with meals.    Marland Kitchen linezolid (ZYVOX) 600 MG tablet Take 1 tablet (600 mg total) by mouth 2 (two) times daily. 28 tablet 1  . metoprolol succinate (TOPROL-XL) 50 MG 24 hr tablet TAKE 1 TABLET BY MOUTH  DAILY WITH OR IMMEDIATLEY  FOLLOWING A MEAL 90 tablet 2  . potassium chloride SA (KLOR-CON) 20 MEQ tablet Take 1 tablet by mouth daily 90 tablet 1  . torsemide (DEMADEX) 20 MG tablet Take 2 tablets (40 mg total) by mouth daily. 180 tablet 3   No current facility-administered medications for this visit.    REVIEW OF SYSTEMS: see HPI for pertinent positives and negatives   Physical Examination  Vitals:   10/26/19 1202  BP: 121/66  Pulse: 68  Resp: 18  Temp: (!) 97.5 F (36.4 C)  TempSrc: Temporal  SpO2: 98%  Weight: 299 lb (135.6 kg)  Height: 5\' 9"  (1.753 m)   Body mass index is 44.15 kg/m.  General: Morbidly obese male in NAD HEENT:  No gross abnormalities Pulmonary: Respirations are non-labored, distant breath sounds, no rales, rhonchi, or wheezes Abdomen: Soft and non-tender with normal pitched bowel sounds.  Musculoskeletal: There are no major deformities.   Neurologic: No focal weakness or paresthesias are detected, muscle strength is 5/5 in all extremities Skin: Unna boot in place on left lower leg, applied yesterday at the [facility] wound care center. 2+ piting and non pitting edema in both lower legs.  Psychiatric: The patient has normal affect. Cardiovascular: There is a regular rate and rhythm without significant murmur appreciated.    Vascular: Vessel Right Left  Radial Palpable Palpable  Carotid  without bruit  without bruit  Aorta Not palpable N/A    Popliteal Not palpable Not palpable  PT Not Palpable Not Palpable  DP Not Palpable Not Palpable    DATA  BLE Venous Duplex (Date: 10/26/2019):  Venous Reflux Times +--------------+------+-----------+------------+--------+ RIGHT         RefluxReflux TimeDiameter cmsComments                Yes                                  +--------------+------+-----------+------------+--------+ CFV            yes   >  1 second                      +--------------+------+-----------+------------+--------+ GSV at SFJ                        0.770             +--------------+------+-----------+------------+--------+ GSV prox thigh                    0.600             +--------------+------+-----------+------------+--------+ GSV mid thigh                     0.559             +--------------+------+-----------+------------+--------+ GSV dist thigh                    0.626             +--------------+------+-----------+------------+--------+ GSV at knee                       0.505             +--------------+------+-----------+------------+--------+ GSV prox calf                     0.534             +--------------+------+-----------+------------+--------+ GSV mid calf                      0.543             +--------------+------+-----------+------------+--------+ SSV Pop Fossa                     0.321             +--------------+------+-----------+------------+--------+ SSV prox calf                     0.342             +--------------+------+-----------+------------+--------+ SSV mid calf                      0.338             +--------------+------+-----------+------------+--------+   +--------------+------+-----------+------------+--------+ LEFT          RefluxReflux TimeDiameter cmsComments                Yes                                   +--------------+------+-----------+------------+--------+ GSV at SFJ                        0.797             +--------------+------+-----------+------------+--------+ GSV prox thigh                    0.599             +--------------+------+-----------+------------+--------+ GSV mid thigh                     0.591             +--------------+------+-----------+------------+--------+ GSV dist thigh  0.554             +--------------+------+-----------+------------+--------+ GSV at knee                       0.645             +--------------+------+-----------+------------+--------+ GSV prox calf                     0.472             +--------------+------+-----------+------------+--------+ GSV mid calf                      0.493             +--------------+------+-----------+------------+--------+ SSV Pop Fossa                     0.746             +--------------+------+-----------+------------+--------+ SSV prox calf                     0.431             +--------------+------+-----------+------------+--------+ SSV mid calf                      0.527             +--------------+------+-----------+------------+--------+ Summary: Right: There is no evidence of deep vein thrombosis in the lower extremity. There is no evidence of superficial venous thrombosis. No cystic structure found in the popliteal fossa. The sapheno-femoral junction is competent. No evidence of GSV reflux. No evidence of SSV reflux. The right common femoral vein demonstrates reflux.  Left: There is no evidence of deep vein thrombosis in the lower extremity. There is no evidence of superficial venous thrombosis. No cystic structure found in the popliteal fossa. The sapheno-femoral junction is competent. No evidence of GSV reflux. No evidence of SSV reflux. No evidence of deep venous reflux.   ABI Findings  (10-03-19): +---------+------------------+-----+---------+--------+ Right    Rt Pressure (mmHg)IndexWaveform Comment  +---------+------------------+-----+---------+--------+ Brachial 151                    triphasic         +---------+------------------+-----+---------+--------+ PTA      193               1.19 triphasic         +---------+------------------+-----+---------+--------+ DP       163               1.01 triphasic         +---------+------------------+-----+---------+--------+ [facility] [place]               0.69 Abnormal          +---------+------------------+-----+---------+--------+  +--------+------------------+-----+---------+---------+ Left    Lt Pressure (mmHg)IndexWaveform Comment   +--------+------------------+-----+---------+---------+ [id number]                    triphasic          +--------+------------------+-----+---------+---------+ PTA                                     Unna Boot +--------+------------------+-----+---------+---------+ DP      203               1.25 triphasic          +--------+------------------+-----+---------+---------+  +-------+-----------+-----------+------------+------------+  ABI/TBIToday's ABIToday's TBIPrevious ABIPrevious TBI +-------+-----------+-----------+------------+------------+ Right  1.19       0.69                                +-------+-----------+-----------+------------+------------+ Left   1.25       0.61                                +-------+-----------+-----------+------------+------------+ Summary: Right: Resting right ankle-brachial index is within normal range. No evidence of significant right lower extremity arterial disease. The right toe-brachial index is abnormal. ABIs are unreliable. RT great toe pressure = 112 mmHg. Left: Resting left ankle-brachial index is within normal range. No evidence of significant left lower extremity arterial  disease. The left toe-brachial index is abnormal. ABIs are unreliable.   Medical Decision Making  [name]. is a 68 y.o. male who presents with: apparent cellulitis of left lower leg, managed by Dr. Tommy Medal, [place], and [place] wound care center with weekly unna boot which is on now.  Pt is morbidly obese.   I discussed with Dr. Donzetta Matters, by phone, pt pertinent HPI, physical exam results, and venous duplex results from today, with normal ABI's last month.   No venous reflux in the left LE, the leg that has the cellulitis. No venous reflux in the right leg except for the right common femoral vein.  He has an Haematologist medicated compression dressing on his left lower leg, changed weekly at the wound care center in [address]. He showed me a photo of his leg taken yesterday before the unna boot was reapplied, looks like cellulitis, red skin. He has 2+ pitting and non pitting edema in both lower legs.   No venous intervention will help his lower extremity edema, needs medical management with compression of his lower extremities, weight loss, elevating his feet above his heart whenever possible, avoid prolonged periods of sitting or standing, regular exercise, and continue at wound care center and ID as needed.   Follow up with [facility] as needed.   Thank you for allowing [place] to participate in this patient's care.   Clemon Chambers, RN, MSN, FNP-C [facility] Office: [phone]  10/26/2019, 12:55 PM  Clinic MD: Donzetta Matters

## 2019-10-30 ENCOUNTER — Telehealth: Payer: Self-pay

## 2019-10-30 ENCOUNTER — Ambulatory Visit (HOSPITAL_COMMUNITY)
Admission: RE | Admit: 2019-10-30 | Discharge: 2019-10-30 | Disposition: A | Discharge status: Home / Self Care | Payer: Medicare Other | Source: Ambulatory Visit | Attending: Infectious Disease | Admitting: Infectious Disease

## 2019-10-30 ENCOUNTER — Ambulatory Visit: Payer: Medicare Other | Admitting: Infectious Disease

## 2019-10-30 ENCOUNTER — Other Ambulatory Visit: Payer: Self-pay

## 2019-10-30 DIAGNOSIS — E11628 Type 2 diabetes mellitus with other skin complications: Secondary | ICD-10-CM

## 2019-10-30 DIAGNOSIS — I872 Venous insufficiency (chronic) (peripheral): Secondary | ICD-10-CM

## 2019-10-30 NOTE — Telephone Encounter (Signed)
Patient left VM in triage stating he was canceling due to him refusing his MRI yesterday on his foot. Stated the appointment would be a waste of time being that he refused his MRI yesterday (because he thought he was having his entire foot viewed, not just his toes. Eugenia Mcalpine

## 2019-10-30 NOTE — Progress Notes (Signed)
Patient arrived for MRI foot, pt states this is the wrong order and it should be Tib/Fib and not foot. Patient states he will discuss with MD this morning. MRI Foot order is in ancillary orders.

## 2019-10-30 NOTE — Telephone Encounter (Signed)
Patient was scheduled for MRI today and was told the MRI was on his toes, not the ankle or foot. He did not see why it was ordered for the toes and e refused the visit. He was to follow up with Dr Tommy Medal afterwards.   He goes back to [facility] wound center next week.  He has a nurse that comes and wraps his leg weekly.   He would like to follow up with Dr Tommy Medal in January, he has enough antibiotic to last until the appointment.    Patient scheduled for November 18, 2018.  Laverle Patter, RN

## 2019-10-30 NOTE — Addendum Note (Signed)
Addended by: York Cerise C on: 10/30/2019 02:59 PM   Modules accepted: Orders

## 2019-10-31 NOTE — Telephone Encounter (Signed)
I am pretty confident I ordered an MRI of the entire foot

## 2019-10-31 NOTE — Telephone Encounter (Signed)
I swear I ordered it for his foot but regardless my 1 question is if the Zyvox helped him at all or if it made no difference

## 2019-11-01 ENCOUNTER — Other Ambulatory Visit: Payer: Self-pay | Admitting: Physician Assistant

## 2019-11-01 ENCOUNTER — Other Ambulatory Visit: Payer: Self-pay

## 2019-11-01 ENCOUNTER — Encounter: Payer: Medicare Other | Admitting: Physician Assistant

## 2019-11-01 DIAGNOSIS — I890 Lymphedema, not elsewhere classified: Secondary | ICD-10-CM | POA: Diagnosis not present

## 2019-11-01 NOTE — Progress Notes (Signed)
TREYVONNE, FELIPE ([id number]) Visit Report for 11/01/2019 Arrival Information Details Patient Name: ZABDI, KASAL. Date of Service: 11/01/2019 8:45 AM Medical Record Number: [id number] Patient Account Number: [id number] Date of Birth/Sex: 1950-11-26 (67 y.o. M) Treating RN: Army Melia Primary Care Ambry Dix: Aura Dials Other Clinician: Referring Airam Heidecker: Aura Dials Treating Mlissa Tamayo/Extender: Melburn Hake, HOYT Weeks in Treatment: 5 Visit Information History Since Last Visit Added or deleted any medications: No Patient Arrived: Ambulatory Any new allergies or adverse reactions: No Arrival Time: 08:54 Had a fall or experienced change in No Accompanied By: self activities of daily living that may affect Transfer Assistance: None risk of falls: Patient Identification Verified: Yes Signs or symptoms of abuse/neglect since last visito No Secondary Verification Process Yes Hospitalized since last visit: No Completed: Implantable device outside of the clinic excluding No Patient Requires Transmission-Based No cellular tissue based products placed in the center Precautions: since last visit: Patient Has Alerts: Yes Has Dressing in Place as Prescribed: Yes Patient Alerts: DM II Has Compression in Place as Prescribed: Yes ABI11/18/20 L 1.25 R Pain Present Now: No 1.19 Electronic Signature(s) Signed: 11/01/2019 2:31:12 PM By: Lorine Bears RCP, RRT, CHT Entered By: Becky Sax, Amado Nash on 11/01/2019 08:55:32 Jr, [name] ([id number]) -------------------------------------------------------------------------------- Compression Therapy Details Patient Name: [name] Date of Service: 11/01/2019 8:45 AM Medical Record Number: [id number] Patient Account Number: [id number] Date of Birth/Sex: [birth year] (68 y.o. M) Treating RN: Army Melia Primary Care Lachae Hohler: Aura Dials Other Clinician: Referring Toye Rouillard: Aura Dials Treating  Tonni Mansour/Extender: Melburn Hake, HOYT Weeks in Treatment: 5 Compression Therapy Performed for Wound Assessment: NonWound Condition Lymphedema - Bilateral Leg Performed By: Clinician Army Melia, RN Compression Type: Four Layer Pre Treatment ABI: 1.2 Post Procedure Diagnosis Same as Pre-procedure Electronic Signature(s) Signed: 11/01/2019 11:07:02 AM By: Army Melia Entered By: Army Melia on 11/01/2019 09:40:03 [place], [name] ([id number]) -------------------------------------------------------------------------------- Encounter Discharge Information Details Patient Name: YASMANI, GADWAY. Date of Service: 11/01/2019 8:45 AM Medical Record Number: [id number] Patient Account Number: [id number] Date of Birth/Sex: 09-13-1951 (68 y.o. M) Treating RN: Army Melia Primary Care Braheem Tomasik: Aura Dials Other Clinician: Referring Nahia Nissan: Aura Dials Treating Aminah Zabawa/Extender: Melburn Hake, HOYT Weeks in Treatment: 5 Encounter Discharge Information Items Discharge Condition: Stable Ambulatory Status: Ambulatory Discharge Destination: Home Transportation: Other Accompanied By: self Schedule Follow-up Appointment: Yes Clinical Summary of Care: Electronic Signature(s) Signed: 11/01/2019 11:07:02 AM By: Army Melia Entered By: Army Melia on 11/01/2019 09:44:27 Kaucher, [name] ([id number]) -------------------------------------------------------------------------------- Lower Extremity Assessment Details Patient Name: [name] Date of Service: 11/01/2019 8:45 AM Medical Record Number: [id number] Patient Account Number: [id number] Date of Birth/Sex: 29-Apr-1951 (68 y.o. M) Treating RN: Cornell Barman Primary Care Joslynn Jamroz: Aura Dials Other Clinician: Referring Mayleen Borrero: Aura Dials Treating Mariska Daffin/Extender: Melburn Hake, HOYT Weeks in Treatment: 5 Edema Assessment Assessed: [Left: No] [Right: No] [Left: Edema] [Right: :] Calf Left: Right: Point of Measurement: 36  cm From Medial Instep 49 cm cm Ankle Left: Right: Point of Measurement: 12 cm From Medial Instep 34.5 cm cm Vascular Assessment Pulses: Dorsalis Pedis Palpable: [Left:Yes] Electronic Signature(s) Signed: 11/01/2019 11:42:17 AM By: Gretta Cool, BSN, RN, CWS, Kim RN, BSN Entered By: Gretta Cool, BSN, RN, CWS, Kim on 11/01/2019 09:19:40 Mcelreath, [name] ([id number]) -------------------------------------------------------------------------------- Multi Wound Chart Details Patient Name: [name] Date of Service: 11/01/2019 8:45 AM Medical Record Number: [id number] Patient Account Number: [id number] Date of Birth/Sex: 07-31-1951 (68 y.o. M) Treating RN: Army Melia Primary Care Cherri Yera: Aura Dials Other Clinician: Referring Yittel Emrich: Aura Dials Treating Camrynn Mcclintic/Extender: Joaquim Lai  III, HOYT Weeks in Treatment: 5 Vital Signs Height(in): 69 Pulse(bpm): 58 Weight(lbs): 295 Blood Pressure(mmHg): 160/57 Body Mass Index(BMI): 44 Temperature(F): 97.9 Respiratory Rate 16 (breaths/min): Wound Assessments Treatment Notes Electronic Signature(s) Signed: 11/01/2019 11:07:02 AM By: Army Melia Entered By: Army Melia on 11/01/2019 09:30:51 Sullivant, [name] ([id number]) -------------------------------------------------------------------------------- Multi-Disciplinary Care Plan Details Patient Name: LYDIA, KRUMREY. Date of Service: 11/01/2019 8:45 AM Medical Record Number: [id number] Patient Account Number: [id number] Date of Birth/Sex: 03-Feb-1951 (68 y.o. M) Treating RN: Army Melia Primary Care Jahshua Bonito: Aura Dials Other Clinician: Referring Lameisha Schuenemann: Aura Dials Treating Ericha Whittingham/Extender: Melburn Hake, HOYT Weeks in Treatment: 5 Active Inactive Nutrition Nursing Diagnoses: Impaired glucose control: actual or potential Goals: Patient/caregiver verbalizes understanding of need to maintain therapeutic glucose control per primary care physician Date Initiated:  09/27/2019 Target Resolution Date: 10/19/2019 Goal Status: Active Interventions: Assess HgA1c results as ordered upon admission and as needed Notes: Orientation to the Wound Care Program Nursing Diagnoses: Knowledge deficit related to the wound healing center program Goals: Patient/caregiver will verbalize understanding of the [place] Date Initiated: 09/27/2019 Target Resolution Date: 10/19/2019 Goal Status: Active Interventions: Provide education on orientation to the wound center Notes: Wound/Skin Impairment Nursing Diagnoses: Impaired tissue integrity Goals: Ulcer/skin breakdown will have a volume reduction of 30% by week 4 Date Initiated: 09/27/2019 Target Resolution Date: 10/25/2019 Goal Status: Active Interventions: Assess ulceration(s) every visit KHALIN, MANTIONE ([id number]) Notes: Electronic Signature(s) Signed: 11/01/2019 11:07:02 AM By: Army Melia Entered By: Army Melia on 11/01/2019 09:29:29 Har[place], [name]. ([id number]) -------------------------------------------------------------------------------- Non-Wound Condition Assessment Details Patient Name: [name] Date of Service: 11/01/2019 8:45 AM Medical Record Number: [id number] Patient Account Number: [id number] Date of Birth/Sex: 10/19/1951 (68 y.o. M) Treating RN: Cornell Barman Primary Care Kinsley Holderman: Aura Dials Other Clinician: Referring Dareld Mcauliffe: Aura Dials Treating Zarianna Dicarlo/Extender: Melburn Hake, HOYT Weeks in Treatment: 5 Non-Wound Condition: Condition: Lymphedema Location: Leg Side: Bilateral Photos Electronic Signature(s) Signed: 11/01/2019 11:42:17 AM By: Gretta Cool, BSN, RN, CWS, Kim RN, BSN Entered By: Gretta Cool, BSN, RN, CWS, Kim on 11/01/2019 09:17:47 Skirvin, [name] ([id number]) -------------------------------------------------------------------------------- Pain Assessment Details Patient Name: JAMEZ, LEVEE. Date of Service: 11/01/2019 8:45 AM Medical  Record Number: [id number] Patient Account Number: [id number] Date of Birth/Sex: May 01, 1951 (68 y.o. M) Treating RN: Army Melia Primary Care Aniston Christman: Aura Dials Other Clinician: Referring Yakelin Grenier: Aura Dials Treating Alisea Matte/Extender: Melburn Hake, HOYT Weeks in Treatment: 5 Active Problems Location of Pain Severity and Description of Pain Patient Has Paino No Site Locations Pain Management and Medication Current Pain Management: Electronic Signature(s) Signed: 11/01/2019 11:07:02 AM By: Army Melia Signed: 11/01/2019 2:31:12 PM By: Lorine Bears RCP, RRT, CHT Entered By: Lorine Bears on 11/01/2019 08:55:39 Geddis, [name] ([id number]) -------------------------------------------------------------------------------- Patient/Caregiver Education Details Patient Name: [name] Date of Service: 11/01/2019 8:45 AM Medical Record Number: [id number] Patient Account Number: [id number] Date of Birth/Gender: 1951/10/21 (68 y.o. M) Treating RN: Army Melia Primary Care Physician: Aura Dials Other Clinician: Referring Physician: Aura Dials Treating Physician/Extender: Sharalyn Ink in Treatment: 5 Education Assessment Education Provided To: Patient Education Topics Provided Wound/Skin Impairment: Handouts: Caring for Your Ulcer Methods: Demonstration, Explain/Verbal Responses: State content correctly Electronic Signature(s) Signed: 11/01/2019 11:07:02 AM By: Army Melia Entered By: Army Melia on 11/01/2019 09:43:41 Talwar, [name] ([id number]) -------------------------------------------------------------------------------- Vitals Details Patient Name: [name] Date of Service: 11/01/2019 8:45 AM Medical Record Number: [id number] Patient Account Number: [id number] Date of Birth/Sex: [birth year] (68 y.o. M) Treating RN: Army Melia Primary Care Jazmen Lindenbaum: Aura Dials  Other Clinician: Referring Casyn Becvar:  Aura Dials Treating Yang Rack/Extender: STONE III, HOYT Weeks in Treatment: 5 Vital Signs Time Taken: 08:55 Temperature (F): 97.9 Height (in): 69 Pulse (bpm): 58 Weight (lbs): 295 Respiratory Rate (breaths/min): 16 Body Mass Index (BMI): 43.6 Blood Pressure (mmHg): 160/57 Reference Range: 80 - 120 mg / dl Electronic Signature(s) Signed: 11/01/2019 2:31:12 PM By: Lorine Bears RCP, RRT, CHT Entered By: Lorine Bears on 11/01/2019 08:58:10

## 2019-11-01 NOTE — Telephone Encounter (Signed)
Ok very good thanks Michelle 

## 2019-11-01 NOTE — Progress Notes (Addendum)
JAION, RAVELO ([id number]) Visit Report for 11/01/2019 Chief Complaint Document Details Patient Name: [name], [name]. Date of Service: 11/01/2019 8:45 AM Medical Record Number: [id number] Patient Account Number: [id number] Date of Birth/Sex: 23-Dec-1950 (68 y.o. M) Treating RN: Army Melia Primary Care Provider: Aura Dials Other Clinician: Referring Provider: Aura Dials Treating Provider/Extender: Melburn Hake, Cherrill Scrima Weeks in Treatment: 5 Information Obtained from: Patient Chief Complaint Bilateral LE lymphedema left leg cellulitis Electronic Signature(s) Signed: 11/01/2019 9:06:28 AM By: Worthy Keeler PA-C Entered By: Worthy Keeler on 11/01/2019 09:06:28 [name], Keith Mocha ([id number]) -------------------------------------------------------------------------------- HPI Details Patient Name: [name]. Date of Service: 11/01/2019 8:45 AM Medical Record Number: [id number] Patient Account Number: [id number] Date of Birth/Sex: 07-Jun-1951 (68 y.o. M) Treating RN: Army Melia Primary Care Provider: Aura Dials Other Clinician: Referring Provider: Aura Dials Treating Provider/Extender: Melburn Hake, Torri Michalski Weeks in Treatment: 5 History of Present Illness HPI Description: 09/27/2019 on evaluation today patient appears to be doing poorly in regard to his bilateral lower extremities he does have bilateral lower extremity lymphedema. With that being said he also has a history of diabetes type 2, hypertension, congestive heart failure, and apparently has chronic venous insufficiency which has led to the lymphedema. Currently he also shows signs of an infection of the left lower extremity which does have me concerned at this point as well. He tells me that he is not really having any significant pain but has had a lot of trouble with his legs in general. He does have a compression stocking he is wearing on the right lower extremity he was wrapped with an Unna boot on the  left lower extremity. The patient tells me that overall he has been doing with this for several weeks and home health has been initiated. He has not been on any antibiotics yet and he tells me that the home health nurse was not sure that it was infected based on what I see today I am concerned that this likely is infected at this point. Fortunately however there does not appear to be any evidence of systemic infection which is good news. The Unna boot does seem to be helping control the edema although he may benefit from a stronger compression wrap I think that at this point without having the ABIs completed the Unna boot is where I would stand. He is supposed to be having an arterial study with ABI as ordered by his cardiologist but he states he may have missed that appointment and has not called to reschedule he needs to contact them to get this rescheduled as soon as possible I explained to him. He tells me that he is draining a lot as far as the left lower extremity is concerned and that is something that he feels like is not lasting very long as far as the wraps are concerned when they put him on they seem to get wet extremely quickly. 10/04/2019 on evaluation today patient appears to be doing better compared to last week's evaluation. Fortunately there is no signs of active infection at this time. He has been tolerating the dressing changes without complication. He did undergo arterial studies yesterday and has an appointment tomorrow with the vascular specialist. With that being said his ABI on the left was 1.25 on the right 1.19 with a TBI of 0.61 on the left and 0.69 on the right. There was stated to be no evidence of obvious significant arterial disease and the patient had pretty much triphasic blood flow throughout. Overall  I am very pleased with how things seem to be progressing. We will see if the vascular doctor says anything different tomorrow but I even at this point I feel like he  would do well with a stronger compression wrap to try to get some the fluid out of his leg at this point. The good news is his drainage has slowed down considerably I do believe the antibiotics have been beneficial for him. 10/18/2019 on evaluation today patient appears to be doing poorly in regard to his left lower extremity. He still having a lot of drainage and weeping from his leg this appears to be much more erythematous and in fact I am concerned that the erythema may be spreading up his leg as well. He has not experienced any fevers, chills, nausea, vomiting, or diarrhea up to this point. I explained to him that I really feel like based on what I am seeing he may need to potentially go to the hospital for evaluation and treatment potentially even with IV antibiotics depending on what they see. With that being said he tells me that he is not really able to do that due to the fact that he is the primary caregiver for his wife who is legally blind. He states he is not good to be able to have anything to help as far as caring for her if he were to go into the hospital. Nonetheless he is unsure what to do in this regard. I really think that he needs faster treatment. For that reason I am going to likely try to get in touch with the infectious disease office to see if I can get him in sooner than [place]. 10/25/2019 on evaluation today patient appears to be doing a little better in regard to his weeping and edema compared to the last time I saw him. The dorsal surface of his foot is a little bit drier compared to what was this is good news. He did see Dr. Drucilla Schmidt on 10/22/2019. Dr. Drucilla Schmidt did feel that potentially this could be secondary to some cellulitis and he recommended subsequently placing the patient on Zyvox which the patient feels like has helped as well. There does not appear to be any signs of active infection at this time which is good news systemically. I am very pleased that Dr.  Drucilla Schmidt was able to see him so quickly I do appreciate this greatly. With that being said he also did recommend an MRI for the patient to evaluate for any deeper signs of infection. That is actually scheduled for this coming Tuesday. He also has a follow-up appoint with Dr. Drucilla Schmidt on Tuesday. The last thing Dr. Drucilla Schmidt did mention was the possibility of more aggressive diuresis. The patient is on torsemide. With that being said this is managed by his cardiologist. I think we may want to get in touch with her clinic and see if possibly this is something that could be increased for a short amount of time depending on their recommendations to see if we can get some additional fluid off of him which in turn would likely help tremendously with his KEREL, ZEIDERS. ([id number]) weeping and edema. He is in agreement with this plan if his cardiologist is in agreement. 11/01/2019 on evaluation today patient unfortunately notes that he did not get his MRI done which was ordered by Dr. Drucilla Schmidt. Apparently when he went in it was 6:30 in the morning and they told him that he was there to get an MRI of  his toes. With that being said the MRI was ordered of his ankle and foot and so long story short the patient said that he was not getting MRI of his toes and left. Dr. Drucilla Schmidt was alerted and again it does not appear that at all that was what he ordered that he did order the ankle and foot but nonetheless that is beside the point at this time. The patient still has considerable edema noted at this time the nurse that came out last after just put a Kerlix and Coban wrap on the patient she did not even properly wrap him. Subsequently they have also discharged him being that they state he is not skilled at this point. Therefore he is going to have to be seen here in the office only. Fortunately there is no signs of systemic infection he still has a lot of erythema around the ankle and lower extremity region again there  are any specific open wounds just general weeping and evidence of erythema which Dr. Drucilla Schmidt is questioning whether or not this is even infection or if it is just more secondary to the patient's lymphedema. He has had venous reflux studies which were negative for any abnormal findings unfortunately that is also not good to be an answer for [place] here. I have recommended the patient today contact his primary care provider to see if they can increase his fluid pills and I also think we need to increase his compression wrap to a 4-layer compression wrap to try to more effectively manage his edema. Electronic Signature(s) Signed: 11/01/2019 9:47:09 AM By: Worthy Keeler PA-C Entered By: Worthy Keeler on 11/01/2019 09:47:08 Bihm, Keith Mocha ([id number]) -------------------------------------------------------------------------------- Physical Exam Details Patient Name: DUVAN, BENZIE. Date of Service: 11/01/2019 8:45 AM Medical Record Number: [id number] Patient Account Number: [id number] Date of Birth/Sex: [birth year] (68 y.o. M) Treating RN: Army Melia Primary Care Provider: Aura Dials Other Clinician: Referring Provider: Aura Dials Treating Provider/Extender: STONE III, Shahil Speegle Weeks in Treatment: 5 Constitutional Well-nourished and well-hydrated in no acute distress. Respiratory normal breathing without difficulty. clear to auscultation bilaterally. Cardiovascular regular rate and rhythm with normal S1, S2. Psychiatric this patient is able to make decisions and demonstrates good insight into disease process. Alert and Oriented x 3. pleasant and cooperative. Notes Upon inspection today patient's wound bed currently is showing signs of being just a generalized weeping region over the lower extremity there is still erythema I really do not see significant improvement since last week to be peripherally honest. There is no signs of systemic infection although I do still question  whether or not there is an infectious component to this either way the Zyvox does not seem to be helping in my opinion. Electronic Signature(s) Signed: 11/01/2019 9:47:40 AM By: Worthy Keeler PA-C Entered By: Worthy Keeler on 11/01/2019 09:47:40 Reddoch, Keith Mocha ([id number]) -------------------------------------------------------------------------------- Physician Orders Details Patient Name: [name]. Date of Service: 11/01/2019 8:45 AM Medical Record Number: [id number] Patient Account Number: [id number] Date of Birth/Sex: 1951/04/06 (68 y.o. M) Treating RN: Army Melia Primary Care Provider: Aura Dials Other Clinician: Referring Provider: Aura Dials Treating Provider/Extender: Melburn Hake, Laporshia Hogen Weeks in Treatment: 5 Verbal / Phone Orders: No Diagnosis Coding ICD-10 Coding Code Description I89.0 Lymphedema, not elsewhere classified I87.2 Venous insufficiency (chronic) (peripheral) E11.628 Type 2 diabetes mellitus with other skin complications [id number] Essential (primary) hypertension I50.42 Chronic combined systolic (congestive) and diastolic (congestive) heart failure L02.416 Cutaneous abscess of left lower limb Wound Cleansing o Clean wound  with Normal Saline. - in office o Cleanse wound with mild soap and water - left leg Primary Wound Dressing o Silver Alginate - over weeping areas Secondary Dressing o XtraSorb Dressing Change Frequency o Dressing is to be changed Monday and Thursday. - Nurse visit as needed Follow-up Appointments o Return Appointment in 1 week. Edema Control o 4-Layer Compression System - Left Lower Extremity. o Other: - Unna to Adult nurse) Signed: 11/01/2019 11:07:02 AM By: Army Melia Signed: 11/02/2019 10:58:36 AM By: Worthy Keeler PA-C Entered By: Army Melia on 11/01/2019 09:42:49 Penrod, Keith Mocha  ([id number]) -------------------------------------------------------------------------------- Problem List Details Patient Name: ADD, JEZEWSKI. Date of Service: 11/01/2019 8:45 AM Medical Record Number: [id number] Patient Account Number: [id number] Date of Birth/Sex: [birth year] (68 y.o. M) Treating RN: Army Melia Primary Care Provider: Aura Dials Other Clinician: Referring Provider: Aura Dials Treating Provider/Extender: Melburn Hake, Jonnathan Birman Weeks in Treatment: 5 Active Problems ICD-10 Evaluated Encounter Code Description Active Date Today Diagnosis I89.0 Lymphedema, not elsewhere classified 09/27/2019 No Yes I87.2 Venous insufficiency (chronic) (peripheral) 09/27/2019 No Yes E11.628 Type 2 diabetes mellitus with other skin complications 123XX123 No Yes I10 Essential (primary) hypertension 09/27/2019 No Yes I50.42 Chronic combined systolic (congestive) and diastolic 123XX123 No Yes (congestive) heart failure L02.416 Cutaneous abscess of left lower limb 09/27/2019 No Yes Inactive Problems Resolved Problems Electronic Signature(s) Signed: 11/01/2019 9:06:22 AM By: Worthy Keeler PA-C Entered By: Worthy Keeler on 11/01/2019 09:06:22 Suchan, Keith Mocha ([id number]) -------------------------------------------------------------------------------- Progress Note Details Patient Name: [name]. Date of Service: 11/01/2019 8:45 AM Medical Record Number: [id number] Patient Account Number: [id number] Date of Birth/Sex: 02-02-1951 (68 y.o. M) Treating RN: Army Melia Primary Care Provider: Aura Dials Other Clinician: Referring Provider: Aura Dials Treating Provider/Extender: Melburn Hake, Aedon Deason Weeks in Treatment: 5 Subjective Chief Complaint Information obtained from Patient Bilateral LE lymphedema left leg cellulitis History of Present Illness (HPI) 09/27/2019 on evaluation today patient appears to be doing poorly in regard to his bilateral lower extremities  he does have bilateral lower extremity lymphedema. With that being said he also has a history of diabetes type 2, hypertension, congestive heart failure, and apparently has chronic venous insufficiency which has led to the lymphedema. Currently he also shows signs of an infection of the left lower extremity which does have me concerned at this point as well. He tells me that he is not really having any significant pain but has had a lot of trouble with his legs in general. He does have a compression stocking he is wearing on the right lower extremity he was wrapped with an Unna boot on the left lower extremity. The patient tells me that overall he has been doing with this for several weeks and home health has been initiated. He has not been on any antibiotics yet and he tells me that the home health nurse was not sure that it was infected based on what I see today I am concerned that this likely is infected at this point. Fortunately however there does not appear to be any evidence of systemic infection which is good news. The Unna boot does seem to be helping control the edema although he may benefit from a stronger compression wrap I think that at this point without having the ABIs completed the Unna boot is where I would stand. He is supposed to be having an arterial study with ABI as ordered by his cardiologist but he states he may have missed that appointment and has not called to  reschedule he needs to contact them to get this rescheduled as soon as possible I explained to him. He tells me that he is draining a lot as far as the left lower extremity is concerned and that is something that he feels like is not lasting very long as far as the wraps are concerned when they put him on they seem to get wet extremely quickly. 10/04/2019 on evaluation today patient appears to be doing better compared to last week's evaluation. Fortunately there is no signs of active infection at this time. He has  been tolerating the dressing changes without complication. He did undergo arterial studies yesterday and has an appointment tomorrow with the vascular specialist. With that being said his ABI on the left was 1.25 on the right 1.19 with a TBI of 0.61 on the left and 0.69 on the right. There was stated to be no evidence of obvious significant arterial disease and the patient had pretty much triphasic blood flow throughout. Overall I am very pleased with how things seem to be progressing. We will see if the vascular doctor says anything different tomorrow but I even at this point I feel like he would do well with a stronger compression wrap to try to get some the fluid out of his leg at this point. The good news is his drainage has slowed down considerably I do believe the antibiotics have been beneficial for him. 10/18/2019 on evaluation today patient appears to be doing poorly in regard to his left lower extremity. He still having a lot of drainage and weeping from his leg this appears to be much more erythematous and in fact I am concerned that the erythema may be spreading up his leg as well. He has not experienced any fevers, chills, nausea, vomiting, or diarrhea up to this point. I explained to him that I really feel like based on what I am seeing he may need to potentially go to the hospital for evaluation and treatment potentially even with IV antibiotics depending on what they see. With that being said he tells me that he is not really able to do that due to the fact that he is the primary caregiver for his wife who is legally blind. He states he is not good to be able to have anything to help as far as caring for her if he were to go into the hospital. Nonetheless he is unsure what to do in this regard. I really think that he needs faster treatment. For that reason I am going to likely try to get in touch with the infectious disease office to see if I can get him in sooner than  [place]. 10/25/2019 on evaluation today patient appears to be doing a little better in regard to his weeping and edema compared to the last time I saw him. The dorsal surface of his foot is a little bit drier compared to what was this is good news. He did see Dr. Drucilla Schmidt on 10/22/2019. Dr. Drucilla Schmidt did feel that potentially this could be secondary to some cellulitis and he recommended subsequently placing the patient on Zyvox which the patient feels like has helped as well. There does not appear to be any signs of active infection at this time which is good news systemically. I am very pleased that Dr. Luther Parody, [facility] M. ([id number]) was able to see him so quickly I do appreciate this greatly. With that being said he also did recommend an MRI for the patient to evaluate  for any deeper signs of infection. That is actually scheduled for this coming Tuesday. He also has a follow-up appoint with Dr. Drucilla Schmidt on Tuesday. The last thing Dr. Drucilla Schmidt did mention was the possibility of more aggressive diuresis. The patient is on torsemide. With that being said this is managed by his cardiologist. I think we may want to get in touch with her clinic and see if possibly this is something that could be increased for a short amount of time depending on their recommendations to see if we can get some additional fluid off of him which in turn would likely help tremendously with his weeping and edema. He is in agreement with this plan if his cardiologist is in agreement. 11/01/2019 on evaluation today patient unfortunately notes that he did not get his MRI done which was ordered by Dr. Drucilla Schmidt. Apparently when he went in it was 6:30 in the morning and they told him that he was there to get an MRI of his toes. With that being said the MRI was ordered of his ankle and foot and so long story short the patient said that he was not getting MRI of his toes and left. Dr. Drucilla Schmidt was alerted and again it does not appear that  at all that was what he ordered that he did order the ankle and foot but nonetheless that is beside the point at this time. The patient still has considerable edema noted at this time the nurse that came out last after just put a Kerlix and Coban wrap on the patient she did not even properly wrap him. Subsequently they have also discharged him being that they state he is not skilled at this point. Therefore he is going to have to be seen here in the office only. Fortunately there is no signs of systemic infection he still has a lot of erythema around the ankle and lower extremity region again there are any specific open wounds just general weeping and evidence of erythema which Dr. Drucilla Schmidt is questioning whether or not this is even infection or if it is just more secondary to the patient's lymphedema. He has had venous reflux studies which were negative for any abnormal findings unfortunately that is also not good to be an answer for [place] here. I have recommended the patient today contact his primary care provider to see if they can increase his fluid pills and I also think we need to increase his compression wrap to a 4-layer compression wrap to try to more effectively manage his edema. Patient History Information obtained from Patient. Family History Cancer - Paternal Grandparents, Diabetes - Mother, No family history of Heart Disease, Hypertension, Kidney Disease, Lung Disease, Seizures, Stroke, Thyroid Problems, Tuberculosis. Social History Former smoker - 74 years ago, Marital Status - Married, Alcohol Use - Rarely, Drug Use - No History, Caffeine Use - Daily. Medical History Cardiovascular Patient has history of Congestive Heart Failure Endocrine Patient has history of Type II Diabetes Musculoskeletal Patient has history of Osteoarthritis - Hands Review of Systems (ROS) Constitutional Symptoms (General Health) Denies complaints or symptoms of Fatigue, Fever, Chills, Marked Weight  Change. Respiratory Denies complaints or symptoms of Chronic or frequent coughs, Shortness of Breath. Cardiovascular Complains or has symptoms of LE edema. Denies complaints or symptoms of Chest pain. Psychiatric Denies complaints or symptoms of Anxiety, Claustrophobia. UZOMA, THWING ([id number]) Objective Constitutional Well-nourished and well-hydrated in no acute distress. Vitals Time Taken: 8:55 AM, Height: 69 in, Weight: 295 lbs, BMI: 43.6, Temperature: 97.9  F, Pulse: 58 bpm, Respiratory Rate: 16 breaths/min, Blood Pressure: 160/57 mmHg. Respiratory normal breathing without difficulty. clear to auscultation bilaterally. Cardiovascular regular rate and rhythm with normal S1, S2. Psychiatric this patient is able to make decisions and demonstrates good insight into disease process. Alert and Oriented x 3. pleasant and cooperative. General Notes: Upon inspection today patient's wound bed currently is showing signs of being just a generalized weeping region over the lower extremity there is still erythema I really do not see significant improvement since last week to be peripherally honest. There is no signs of systemic infection although I do still question whether or not there is an infectious component to this either way the Zyvox does not seem to be helping in my opinion. Other Condition(s) Patient presents with Lymphedema located on the Bilateral Leg. Assessment Active Problems ICD-10 Lymphedema, not elsewhere classified Venous insufficiency (chronic) (peripheral) Type 2 diabetes mellitus with other skin complications Essential (primary) hypertension Chronic combined systolic (congestive) and diastolic (congestive) heart failure Cutaneous abscess of left lower limb Procedures There was a Four Layer Compression Therapy Procedure with a pre-treatment ABI of 1.2 by Army Melia, RN. Post procedure Diagnosis Wound #: Same as Pre-Procedure Dreier, NOUR THEEL  ([id number]) Plan Wound Cleansing: Clean wound with Normal Saline. - in office Cleanse wound with mild soap and water - left leg Primary Wound Dressing: Silver Alginate - over weeping areas Secondary Dressing: XtraSorb Dressing Change Frequency: Dressing is to be changed Monday and Thursday. - Nurse visit as needed Follow-up Appointments: Return Appointment in 1 week. Edema Control: 4-Layer Compression System - Left Lower Extremity. Other: - Unna to anchor wrap 1. Patient's lower extremity currently actually is showing signs still of continued erythema and significant weeping I am not really certain that the patient is good to be able to last a whole week with a compression wrap on. I did talk him into the 4 layer compression wrap today which I think is going to be appropriate. We will need to bring him back on Monday to change out the wrap at least for the time being. 2. I am also can recommend that we continue with the silver alginate along with the XtraSorb for the time being. 3. Also recommend the patient try to elevate his legs as much as possible obviously he needs to sleep with his legs elevated as well. 4. I did recommend as well the patient get in touch with his primary care provider to see if they would be willing to go ahead and increase or rather change his fluid pill to something stronger for short amount of time to help with edema control. We will see patient back for reevaluation in 1 week here in the clinic. If anything worsens or changes patient will contact our office for additional recommendations. With regard to the patient's lower extremity I did actually recommend that potentially having a short hospital course to try to get this under control would be helpful. With that being said the patient takes care of his wife and states that he is not able to go to the hospital for this she is legally blind and cannot take care of her self he does not have any help at  home. Electronic Signature(s) Signed: 11/01/2019 9:49:40 AM By: Worthy Keeler PA-C Entered By: Worthy Keeler on 11/01/2019 09:49:40 Remer, Keith Mocha ([id number]) -------------------------------------------------------------------------------- ROS/PFSH Details Patient Name: [name]. Date of Service: 11/01/2019 8:45 AM Medical Record Number: [id number] Patient Account Number: [id number] Date of Birth/Sex:  1951-06-29 (68 y.o. M) Treating RN: Army Melia Primary Care Provider: Aura Dials Other Clinician: Referring Provider: Aura Dials Treating Provider/Extender: Melburn Hake, Nilah Belcourt Weeks in Treatment: 5 Information Obtained From Patient Constitutional Symptoms (General Health) Complaints and Symptoms: Negative for: Fatigue; Fever; Chills; Marked Weight Change Respiratory Complaints and Symptoms: Negative for: Chronic or frequent coughs; Shortness of Breath Cardiovascular Complaints and Symptoms: Positive for: LE edema Negative for: Chest pain Medical History: Positive for: Congestive Heart Failure Psychiatric Complaints and Symptoms: Negative for: Anxiety; Claustrophobia Endocrine Medical History: Positive for: Type II Diabetes Time with diabetes: 1 year Treated with: Oral agents Blood sugar tested every day: No Musculoskeletal Medical History: Positive for: Osteoarthritis - Hands Immunizations Pneumococcal Vaccine: Received Pneumococcal Vaccination: No Implantable Devices None Family and Social History RAYKWON, CREGAR ([id number]) Cancer: Yes - Paternal Grandparents; Diabetes: Yes - Mother; Heart Disease: No; Hypertension: No; Kidney Disease: No; Lung Disease: No; Seizures: No; Stroke: No; Thyroid Problems: No; Tuberculosis: No; Former smoker - 51 years ago; Marital Status - Married; Alcohol Use: Rarely; Drug Use: No History; Caffeine Use: Daily Physician Affirmation I have reviewed and agree with the above information. Electronic  Signature(s) Signed: 11/01/2019 11:07:02 AM By: Army Melia Signed: 11/02/2019 10:58:36 AM By: Worthy Keeler PA-C Entered By: Worthy Keeler on 11/01/2019 09:47:25 Guia, Keith Mocha ([id number]) -------------------------------------------------------------------------------- SuperBill Details Patient Name: [name]. Date of Service: 11/01/2019 Medical Record Number: [id number] Patient Account Number: [id number] Date of Birth/Sex: 05-Apr-1951 (68 y.o. M) Treating RN: Army Melia Primary Care Provider: Aura Dials Other Clinician: Referring Provider: Aura Dials Treating Provider/Extender: Melburn Hake, Savio Albrecht Weeks in Treatment: 5 Diagnosis Coding ICD-10 Codes Code Description I89.0 Lymphedema, not elsewhere classified I87.2 Venous insufficiency (chronic) (peripheral) E11.628 Type 2 diabetes mellitus with other skin complications 99991111 Essential (primary) hypertension I50.42 Chronic combined systolic (congestive) and diastolic (congestive) heart failure L02.416 Cutaneous abscess of left lower limb Facility Procedures CPT4 Code: [id number] Description: (Facility Use Only) [phone] - [facility]rum LWR LT LEG Modifier: Quantity: 1 Physician Procedures CPT4 Code: [id number] Description: 99214 - WC PHYS LEVEL 4 - EST PT ICD-10 Diagnosis Description I89.0 Lymphedema, not elsewhere classified I87.2 Venous insufficiency (chronic) (peripheral) E11.628 Type 2 diabetes mellitus with other skin complications [id number] Essential (primary)  hypertension Modifier: Quantity: 1 Electronic Signature(s) Signed: 11/01/2019 9:49:56 AM By: Worthy Keeler PA-C Entered By: Worthy Keeler on 11/01/2019 09:49:56

## 2019-11-01 NOTE — Telephone Encounter (Signed)
Per patient, the zyvox doesn't seem to have helped much.  He said he just left wound care this morning and still has drainage present. He said that the dressing was soaked, but it did not drain through - it had been wrapped since Monday when his nurse was at the house. He will follow up with [place] 1/4 as scheduled.

## 2019-11-05 ENCOUNTER — Ambulatory Visit: Payer: Medicare Other

## 2019-11-08 ENCOUNTER — Encounter: Payer: Medicare Other | Admitting: Physician Assistant

## 2019-11-08 ENCOUNTER — Other Ambulatory Visit: Payer: Self-pay

## 2019-11-08 DIAGNOSIS — I890 Lymphedema, not elsewhere classified: Secondary | ICD-10-CM | POA: Diagnosis not present

## 2019-11-08 NOTE — Progress Notes (Signed)
Keith, [name] ([id number]) Visit Report for 11/08/2019 Arrival Information Details Patient Name: [name], [name]. Date of Service: 11/08/2019 8:45 AM Medical Record Number: [id number] Patient Account Number: [id number] Date of Birth/Sex: 07/06/1951 (68 y.o. M) Treating RN: Montey Hora Primary Care Jaline Pincock: Aura Dials Other Clinician: Referring Angelyn Osterberg: Aura Dials Treating Bradyn Soward/Extender: Melburn Hake, HOYT Weeks in Treatment: 6 Visit Information History Since Last Visit Added or deleted any medications: No Patient Arrived: Ambulatory Any new allergies or adverse reactions: No Arrival Time: 08:49 Had a fall or experienced change in No Accompanied By: self activities of daily living that may affect Transfer Assistance: None risk of falls: Patient Identification Verified: Yes Signs or symptoms of abuse/neglect since last visito No Secondary Verification Process Yes Hospitalized since last visit: No Completed: Implantable device outside of the clinic excluding No Patient Requires Transmission-Based No cellular tissue based products placed in the center Precautions: since last visit: Patient Has Alerts: Yes Has Dressing in Place as Prescribed: Yes Patient Alerts: DM II Has Compression in Place as Prescribed: Yes ABI11/18/20 L 1.25 R Pain Present Now: No 1.19 Electronic Signature(s) Signed: 11/08/2019 12:19:19 PM By: Montey Hora Entered By: Montey Hora on 11/08/2019 08:50:58 Bachicha, [name] ([id number]) -------------------------------------------------------------------------------- Compression Therapy Details Patient Name: [name]. Date of Service: 11/08/2019 8:45 AM Medical Record Number: [id number] Patient Account Number: [id number] Date of Birth/Sex: 1951/06/26 (68 y.o. M) Treating RN: Army Melia Primary Care Rogen Porte: Aura Dials Other Clinician: Referring Masashi Snowdon: Aura Dials Treating Shimika Ames/Extender: Melburn Hake, HOYT Weeks  in Treatment: 6 Compression Therapy Performed for Wound Assessment: NonWound Condition Lymphedema - Bilateral Leg Performed By: Clinician Army Melia, RN Compression Type: Four Layer Pre Treatment ABI: 1.2 Post Procedure Diagnosis Same as Pre-procedure Electronic Signature(s) Signed: 11/08/2019 12:15:27 PM By: Army Melia Entered By: Army Melia on 11/08/2019 09:09:31 Lampert, [name] ([id number]) -------------------------------------------------------------------------------- Encounter Discharge Information Details Patient Name: Keith, [name]. Date of Service: 11/08/2019 8:45 AM Medical Record Number: [id number] Patient Account Number: [id number] Date of Birth/Sex: 1951/04/20 (68 y.o. M) Treating RN: Army Melia Primary Care Margeret Stachnik: Aura Dials Other Clinician: Referring Audery Wassenaar: Aura Dials Treating Devanshi Califf/Extender: Melburn Hake, HOYT Weeks in Treatment: 6 Encounter Discharge Information Items Discharge Condition: Stable Ambulatory Status: Ambulatory Discharge Destination: Home Transportation: Private Auto Accompanied By: self Schedule Follow-up Appointment: Yes Clinical Summary of Care: Electronic Signature(s) Signed: 11/08/2019 12:15:27 PM By: Army Melia Entered By: Army Melia on 11/08/2019 09:12:34 Cranmer, [name] ([id number]) -------------------------------------------------------------------------------- Lower Extremity Assessment Details Patient Name: [name]. Date of Service: 11/08/2019 8:45 AM Medical Record Number: [id number] Patient Account Number: [id number] Date of Birth/Sex: 1950/12/15 (68 y.o. M) Treating RN: Montey Hora Primary Care Nadina Fomby: Aura Dials Other Clinician: Referring Itati Brocksmith: Aura Dials Treating Bradi Arbuthnot/Extender: Melburn Hake, HOYT Weeks in Treatment: 6 Edema Assessment Assessed: [Left: No] [Right: No] Edema: [Left: Ye] [Right: s] Calf Left: Right: Point of Measurement: 36 cm From Medial Instep 54 cm  cm Ankle Left: Right: Point of Measurement: 12 cm From Medial Instep 33 cm cm Vascular Assessment Pulses: Dorsalis Pedis Palpable: [Left:Yes] Electronic Signature(s) Signed: 11/08/2019 12:19:19 PM By: Montey Hora Entered By: Montey Hora on 11/08/2019 08:57:35 Schulenburg, [name] ([id number]) -------------------------------------------------------------------------------- Multi Wound Chart Details Patient Name: [name]. Date of Service: 11/08/2019 8:45 AM Medical Record Number: [id number] Patient Account Number: [id number] Date of Birth/Sex: June 16, 1951 (68 y.o. M) Treating RN: Army Melia Primary Care Antigone Crowell: Aura Dials Other Clinician: Referring Marguetta Windish: Aura Dials Treating Kerem Gilmer/Extender: STONE III, HOYT Weeks in Treatment: 6 Vital Signs Height(in): 69 Pulse(bpm):  84 Weight(lbs): 295 Blood Pressure(mmHg): 154/55 Body Mass Index(BMI): 44 Temperature(F): 98.3 Respiratory Rate 16 (breaths/min): Wound Assessments Treatment Notes Electronic Signature(s) Signed: 11/08/2019 12:15:27 PM By: Army Melia Entered By: Army Melia on 11/08/2019 09:05:52 Stuber, [name] ([id number]) -------------------------------------------------------------------------------- Multi-Disciplinary Care Plan Details Patient Name: Keith, [name]. Date of Service: 11/08/2019 8:45 AM Medical Record Number: [id number] Patient Account Number: [id number] Date of Birth/Sex: 16-Feb-1951 (68 y.o. M) Treating RN: Army Melia Primary Care Kimya Mccahill: Aura Dials Other Clinician: Referring Amarra Sawyer: Aura Dials Treating Steele Ledonne/Extender: Melburn Hake, HOYT Weeks in Treatment: 6 Active Inactive Nutrition Nursing Diagnoses: Impaired glucose control: actual or potential Goals: Patient/caregiver verbalizes understanding of need to maintain therapeutic glucose control per primary care physician Date Initiated: 09/27/2019 Target Resolution Date: 10/19/2019 Goal Status:  Active Interventions: Assess HgA1c results as ordered upon admission and as needed Notes: Orientation to the Wound Care Program Nursing Diagnoses: Knowledge deficit related to the wound healing center program Goals: Patient/caregiver will verbalize understanding of the Lead Date Initiated: 09/27/2019 Target Resolution Date: 10/19/2019 Goal Status: Active Interventions: Provide education on orientation to the wound center Notes: Wound/Skin Impairment Nursing Diagnoses: Impaired tissue integrity Goals: Ulcer/skin breakdown will have a volume reduction of 30% by week 4 Date Initiated: 09/27/2019 Target Resolution Date: 10/25/2019 Goal Status: Active Interventions: Assess ulceration(s) every visit ROLLYN, HOWERY ([id number]) Notes: Electronic Signature(s) Signed: 11/08/2019 12:15:27 PM By: Army Melia Entered By: Army Melia on 11/08/2019 09:05:43 Rayfield, [name] ([id number]) -------------------------------------------------------------------------------- Non-Wound Condition Assessment Details Patient Name: [name]. Date of Service: 11/08/2019 8:45 AM Medical Record Number: [id number] Patient Account Number: [id number] Date of Birth/Sex: [birth year] (68 y.o. M) Treating RN: Montey Hora Primary Care Daleisa Halperin: Aura Dials Other Clinician: Referring Nivin Braniff: Aura Dials Treating Kensleigh Gates/Extender: STONE III, HOYT Weeks in Treatment: 6 Non-Wound Condition: Condition: Lymphedema Location: Leg Side: Bilateral Photos Electronic Signature(s) Signed: 11/08/2019 12:19:19 PM By: Montey Hora Entered By: Montey Hora on 11/08/2019 08:57:52 Behrle, [name] ([id number]) -------------------------------------------------------------------------------- Pain Assessment Details Patient Name: [name]. Date of Service: 11/08/2019 8:45 AM Medical Record Number: [id number] Patient Account Number: [id number] Date of Birth/Sex:  Jun 17, 1951 (68 y.o. M) Treating RN: Montey Hora Primary Care Kavita Bartl: Aura Dials Other Clinician: Referring Rui Wordell: Aura Dials Treating Nichelle Renwick/Extender: Melburn Hake, HOYT Weeks in Treatment: 6 Active Problems Location of Pain Severity and Description of Pain Patient Has Paino No Site Locations Pain Management and Medication Current Pain Management: Electronic Signature(s) Signed: 11/08/2019 12:19:19 PM By: Montey Hora Entered By: Montey Hora on 11/08/2019 08:51:05 Loyd, [name] ([id number]) -------------------------------------------------------------------------------- Patient/Caregiver Education Details Patient Name: [name]. Date of Service: 11/08/2019 8:45 AM Medical Record Number: [id number] Patient Account Number: [id number] Date of Birth/Gender: 30-Jan-1951 (68 y.o. M) Treating RN: Army Melia Primary Care Physician: Aura Dials Other Clinician: Referring Physician: Aura Dials Treating Physician/Extender: Sharalyn Ink in Treatment: 6 Education Assessment Education Provided To: Patient Education Topics Provided Wound/Skin Impairment: Handouts: Caring for Your Ulcer Methods: Demonstration, Explain/Verbal Responses: State content correctly Electronic Signature(s) Signed: 11/08/2019 12:15:27 PM By: Army Melia Entered By: Army Melia on 11/08/2019 09:11:12 Lipsitz, [name] ([id number]) -------------------------------------------------------------------------------- Vitals Details Patient Name: [name]. Date of Service: 11/08/2019 8:45 AM Medical Record Number: [id number] Patient Account Number: [id number] Date of Birth/Sex: 1951/07/27 (68 y.o. M) Treating RN: Montey Hora Primary Care Tovah Slavick: Aura Dials Other Clinician: Referring Jasyah Theurer: Aura Dials Treating Kadir Azucena/Extender: Melburn Hake, HOYT Weeks in Treatment: 6 Vital Signs Time Taken: 08:51 Temperature (F): 98.3 Height (in): 69 Pulse  (bpm): 84 Weight (  lbs): 295 Respiratory Rate (breaths/min): 16 Body Mass Index (BMI): 43.6 Blood Pressure (mmHg): 154/55 Reference Range: 80 - 120 mg / dl Electronic Signature(s) Signed: 11/08/2019 12:19:19 PM By: Montey Hora Entered By: Montey Hora on 11/08/2019 08:52:41

## 2019-11-08 NOTE — Progress Notes (Addendum)
[name], [name] Visit Report for 11/08/2019 Chief Complaint Document Details Patient Name: [name]. Date of Service: 11/08/2019 8:45 AM Medical Record Number: [id number] Patient Account Number: [id number] Date of Birth/Sex: [birth year] (68 y.o. M) Treating RN: Army Melia Primary Care Provider: Aura Dials Other Clinician: Referring Provider: Aura Dials Treating Provider/Extender: Melburn Hake, Ayona Yniguez Weeks in Treatment: 6 Information Obtained from: Patient Chief Complaint Bilateral LE lymphedema left leg cellulitis Electronic Signature(s) Signed: 11/08/2019 8:57:08 AM By: Worthy Keeler PA-C Entered By: Worthy Keeler on 11/08/2019 08:57:08 [name], [name] -------------------------------------------------------------------------------- HPI Details Patient Name: [name]. Date of Service: 11/08/2019 8:45 AM Medical Record Number: [id number] Patient Account Number: [id number] Date of Birth/Sex: [birth year] (68 y.o. M) Treating RN: Army Melia Primary Care Provider: Aura Dials Other Clinician: Referring Provider: Aura Dials Treating Provider/Extender: Melburn Hake, Destyni Hoppel Weeks in Treatment: 6 History of Present Illness HPI Description: 09/27/2019 on evaluation today patient appears to be doing poorly in regard to his bilateral lower extremities he does have bilateral lower extremity lymphedema. With that being said he also has a history of diabetes type 2, hypertension, congestive heart failure, and apparently has chronic venous insufficiency which has led to the lymphedema. Currently he also shows signs of an infection of the left lower extremity which does have me concerned at this point as well. He tells me that he is not really having any significant pain but has had a lot of trouble with his legs in general. He does have a compression stocking he is wearing on the right lower extremity he was wrapped with an Unna boot on the  left lower extremity. The patient tells me that overall he has been doing with this for several weeks and home health has been initiated. He has not been on any antibiotics yet and he tells me that the home health nurse was not sure that it was infected based on what I see today I am concerned that this likely is infected at this point. Fortunately however there does not appear to be any evidence of systemic infection which is good news. The Unna boot does seem to be helping control the edema although he may benefit from a stronger compression wrap I think that at this point without having the ABIs completed the Unna boot is where I would stand. He is supposed to be having an arterial study with ABI as ordered by his cardiologist but he states he may have missed that appointment and has not called to reschedule he needs to contact them to get this rescheduled as soon as possible I explained to him. He tells me that he is draining a lot as far as the left lower extremity is concerned and that is something that he feels like is not lasting very long as far as the wraps are concerned when they put him on they seem to get wet extremely quickly. 10/04/2019 on evaluation today patient appears to be doing better compared to last week's evaluation. Fortunately there is no signs of active infection at this time. He has been tolerating the dressing changes without complication. He did undergo arterial studies yesterday and has an appointment tomorrow with the vascular specialist. With that being said his ABI on the left was 1.25 on the right 1.19 with a TBI of 0.61 on the left and 0.69 on the right. There was stated to be no evidence of obvious significant arterial disease and the patient had pretty much triphasic blood flow throughout. Overall  I am very pleased with how things seem to be progressing. We will see if the vascular doctor says anything different tomorrow but I even at this point I feel like he  would do well with a stronger compression wrap to try to get some the fluid out of his leg at this point. The good news is his drainage has slowed down considerably I do believe the antibiotics have been beneficial for him. 10/18/2019 on evaluation today patient appears to be doing poorly in regard to his left lower extremity. He still having a lot of drainage and weeping from his leg this appears to be much more erythematous and in fact I am concerned that the erythema may be spreading up his leg as well. He has not experienced any fevers, chills, nausea, vomiting, or diarrhea up to this point. I explained to him that I really feel like based on what I am seeing he may need to potentially go to the hospital for evaluation and treatment potentially even with IV antibiotics depending on what they see. With that being said he tells me that he is not really able to do that due to the fact that he is the primary caregiver for his wife who is legally blind. He states he is not good to be able to have anything to help as far as caring for her if he were to go into the hospital. Nonetheless he is unsure what to do in this regard. I really think that he needs faster treatment. For that reason I am going to likely try to get in touch with the infectious disease office to see if I can get him in sooner than [place]. 10/25/2019 on evaluation today patient appears to be doing a little better in regard to his weeping and edema compared to the last time I saw him. The dorsal surface of his foot is a little bit drier compared to what was this is good news. He did see Dr. Drucilla Schmidt on 10/22/2019. Dr. Drucilla Schmidt did feel that potentially this could be secondary to some cellulitis and he recommended subsequently placing the patient on Zyvox which the patient feels like has helped as well. There does not appear to be any signs of active infection at this time which is good news systemically. I am very pleased that Dr.  Drucilla Schmidt was able to see him so quickly I do appreciate this greatly. With that being said he also did recommend an MRI for the patient to evaluate for any deeper signs of infection. That is actually scheduled for this coming Tuesday. He also has a follow-up appoint with Dr. Drucilla Schmidt on Tuesday. The last thing Dr. Drucilla Schmidt did mention was the possibility of more aggressive diuresis. The patient is on torsemide. With that being said this is managed by his cardiologist. I think we may want to get in touch with her clinic and see if possibly this is something that could be increased for a short amount of time depending on their recommendations to see if we can get some additional fluid off of him which in turn would likely help tremendously with his SKANDA, DOUB. ([id number]) weeping and edema. He is in agreement with this plan if his cardiologist is in agreement. 11/01/2019 on evaluation today patient unfortunately notes that he did not get his MRI done which was ordered by Dr. Drucilla Schmidt. Apparently when he went in it was 6:30 in the morning and they told him that he was there to get an MRI of  his toes. With that being said the MRI was ordered of his ankle and foot and so long story short the patient said that he was not getting MRI of his toes and left. Dr. Drucilla Schmidt was alerted and again it does not appear that at all that was what he ordered that he did order the ankle and foot but nonetheless that is beside the point at this time. The patient still has considerable edema noted at this time the nurse that came out last after just put a Kerlix and Coban wrap on the patient she did not even properly wrap him. Subsequently they have also discharged him being that they state he is not skilled at this point. Therefore he is going to have to be seen here in the office only. Fortunately there is no signs of systemic infection he still has a lot of erythema around the ankle and lower extremity region again there  are any specific open wounds just general weeping and evidence of erythema which Dr. Drucilla Schmidt is questioning whether or not this is even infection or if it is just more secondary to the patient's lymphedema. He has had venous reflux studies which were negative for any abnormal findings unfortunately that is also not good to be an answer for [place] here. I have recommended the patient today contact his primary care provider to see if they can increase his fluid pills and I also think we need to increase his compression wrap to a 4-layer compression wrap to try to more effectively manage his edema. 11/08/2019 on evaluation today patient appears unfortunately to still be doing poorly. I did speak with his primary care provider office last week they were supposed to call him to advise what should be done as far as his fluid pills were concerned. With that being said they did not contact him as far as the patient tells me. I am not really sure exactly what is going on as far as that is concerned. Nonetheless I really think that the patient needs to be admitted to the hospital for treatment likely as I explained to him he is going to require IV Lasix to get fluid off and to be honest also think he may need IV antibiotics. I have made all the outpatient referrals I can to try to get things under control he has been on oral antibiotics I really just have not seen any significant improvement he was even on linezolid. That was prescribed By infectious disease. Overall I am very concerned about the fact that this is not getting better. Electronic Signature(s) Signed: 11/08/2019 9:18:00 AM By: Worthy Keeler PA-C Entered By: Worthy Keeler on 11/08/2019 09:17:59 [name], [name] -------------------------------------------------------------------------------- Physical Exam Details Patient Name: [name], [name]. Date of Service: 11/08/2019 8:45 AM Medical Record Number: [id number] Patient Account  Number: [id number] Date of Birth/Sex: [birth year] (68 y.o. M) Treating RN: Army Melia Primary Care Provider: Aura Dials Other Clinician: Referring Provider: Aura Dials Treating Provider/Extender: STONE III, Arnetia Bronk Weeks in Treatment: 6 Constitutional Well-nourished and well-hydrated in no acute distress. Respiratory normal breathing without difficulty. clear to auscultation bilaterally. Cardiovascular regular rate and rhythm with normal S1, S2. Psychiatric this patient is able to make decisions and demonstrates good insight into disease process. Alert and Oriented x 3. pleasant and cooperative. Notes His wound currently really is just more of a generalized cellulitis/weeping area of the left lower extremity that has me concerned at this point. I am worried about the possibility of things  getting worse and developing into sepsis. The patient states that in the past has not been able to go to the hospital for treatment simply due to the fact that he has to care for his wife who is blind and really has not had anyone to help in that regard. With that being said I discussed with him again today that I do not see this getting better without potentially hospital stay. Especially with everything we have attempted and tried up to this point to be honest I feel like things are getting worse not better. For that reason he states that he can probably work on getting something settled as far as getting a grandchild or someone to come stay with her for him to go into the hospital. Electronic Signature(s) Signed: 11/08/2019 9:18:49 AM By: Worthy Keeler PA-C Entered By: Worthy Keeler on 11/08/2019 09:18:49 [name], [name] -------------------------------------------------------------------------------- Physician Orders Details Patient Name: [name]. Date of Service: 11/08/2019 8:45 AM Medical Record Number: [id number] Patient Account Number: [id number] Date of  Birth/Sex: Sep 02, 1951 (68 y.o. M) Treating RN: Army Melia Primary Care Provider: Aura Dials Other Clinician: Referring Provider: Aura Dials Treating Provider/Extender: Melburn Hake, Aymen Widrig Weeks in Treatment: 6 Verbal / Phone Orders: No Diagnosis Coding ICD-10 Coding Code Description I89.0 Lymphedema, not elsewhere classified I87.2 Venous insufficiency (chronic) (peripheral) E11.628 Type 2 diabetes mellitus with other skin complications [id number] Essential (primary) hypertension I50.42 Chronic combined systolic (congestive) and diastolic (congestive) heart failure L02.416 Cutaneous abscess of left lower limb Wound Cleansing o Clean wound with Normal Saline. - in office o Cleanse wound with mild soap and water - left leg Primary Wound Dressing o Silver Alginate - over weeping areas Secondary Dressing o XtraSorb Dressing Change Frequency o Dressing is to be changed Monday and Thursday. - Nurse visit as needed Follow-up Appointments o Return Appointment in 1 week. Edema Control o 4-Layer Compression System - Left Lower Extremity. o Other: - Unna to Adult nurse) Signed: 11/08/2019 12:15:27 PM By: Army Melia Signed: 11/08/2019 12:19:39 PM By: Worthy Keeler PA-C Entered By: Army Melia on 11/08/2019 09:10:06 [name], [name] -------------------------------------------------------------------------------- Problem List Details Patient Name: [name], [name]. Date of Service: 11/08/2019 8:45 AM Medical Record Number: [id number] Patient Account Number: [id number] Date of Birth/Sex: 1951/03/24 (68 y.o. M) Treating RN: Army Melia Primary Care Provider: Aura Dials Other Clinician: Referring Provider: Aura Dials Treating Provider/Extender: Melburn Hake, Rolanda Campa Weeks in Treatment: 6 Active Problems ICD-10 Evaluated Encounter Code Description Active Date Today Diagnosis I89.0 Lymphedema, not elsewhere classified 09/27/2019  No Yes I87.2 Venous insufficiency (chronic) (peripheral) 09/27/2019 No Yes E11.628 Type 2 diabetes mellitus with other skin complications 123XX123 No Yes I10 Essential (primary) hypertension 09/27/2019 No Yes I50.42 Chronic combined systolic (congestive) and diastolic 123XX123 No Yes (congestive) heart failure L02.416 Cutaneous abscess of left lower limb 09/27/2019 No Yes Inactive Problems Resolved Problems Electronic Signature(s) Signed: 11/08/2019 8:57:01 AM By: Worthy Keeler PA-C Entered By: Worthy Keeler on 11/08/2019 08:57:00 Suk, [name] -------------------------------------------------------------------------------- Progress Note Details Patient Name: [name]. Date of Service: 11/08/2019 8:45 AM Medical Record Number: [id number] Patient Account Number: [id number] Date of Birth/Sex: 26-Feb-1951 (68 y.o. M) Treating RN: Army Melia Primary Care Provider: Aura Dials Other Clinician: Referring Provider: Aura Dials Treating Provider/Extender: Melburn Hake, Corrissa Martello Weeks in Treatment: 6 Subjective Chief Complaint Information obtained from Patient Bilateral LE lymphedema left leg cellulitis History of Present Illness (HPI) 09/27/2019 on evaluation today patient appears to be doing poorly  in regard to his bilateral lower extremities he does have bilateral lower extremity lymphedema. With that being said he also has a history of diabetes type 2, hypertension, congestive heart failure, and apparently has chronic venous insufficiency which has led to the lymphedema. Currently he also shows signs of an infection of the left lower extremity which does have me concerned at this point as well. He tells me that he is not really having any significant pain but has had a lot of trouble with his legs in general. He does have a compression stocking he is wearing on the right lower extremity he was wrapped with an Unna boot on the left lower extremity. The  patient tells me that overall he has been doing with this for several weeks and home health has been initiated. He has not been on any antibiotics yet and he tells me that the home health nurse was not sure that it was infected based on what I see today I am concerned that this likely is infected at this point. Fortunately however there does not appear to be any evidence of systemic infection which is good news. The Unna boot does seem to be helping control the edema although he may benefit from a stronger compression wrap I think that at this point without having the ABIs completed the Unna boot is where I would stand. He is supposed to be having an arterial study with ABI as ordered by his cardiologist but he states he may have missed that appointment and has not called to reschedule he needs to contact them to get this rescheduled as soon as possible I explained to him. He tells me that he is draining a lot as far as the left lower extremity is concerned and that is something that he feels like is not lasting very long as far as the wraps are concerned when they put him on they seem to get wet extremely quickly. 10/04/2019 on evaluation today patient appears to be doing better compared to last week's evaluation. Fortunately there is no signs of active infection at this time. He has been tolerating the dressing changes without complication. He did undergo arterial studies yesterday and has an appointment tomorrow with the vascular specialist. With that being said his ABI on the left was 1.25 on the right 1.19 with a TBI of 0.61 on the left and 0.69 on the right. There was stated to be no evidence of obvious significant arterial disease and the patient had pretty much triphasic blood flow throughout. Overall I am very pleased with how things seem to be progressing. We will see if the vascular doctor says anything different tomorrow but I even at this point I feel like he would do well with a  stronger compression wrap to try to get some the fluid out of his leg at this point. The good news is his drainage has slowed down considerably I do believe the antibiotics have been beneficial for him. 10/18/2019 on evaluation today patient appears to be doing poorly in regard to his left lower extremity. He still having a lot of drainage and weeping from his leg this appears to be much more erythematous and in fact I am concerned that the erythema may be spreading up his leg as well. He has not experienced any fevers, chills, nausea, vomiting, or diarrhea up to this point. I explained to him that I really feel like based on what I am seeing he may need to potentially go to the  hospital for evaluation and treatment potentially even with IV antibiotics depending on what they see. With that being said he tells me that he is not really able to do that due to the fact that he is the primary caregiver for his wife who is legally blind. He states he is not good to be able to have anything to help as far as caring for her if he were to go into the hospital. Nonetheless he is unsure what to do in this regard. I really think that he needs faster treatment. For that reason I am going to likely try to get in touch with the infectious disease office to see if I can get him in sooner than [place]. 10/25/2019 on evaluation today patient appears to be doing a little better in regard to his weeping and edema compared to the last time I saw him. The dorsal surface of his foot is a little bit drier compared to what was this is good news. He did see Dr. Drucilla Schmidt on 10/22/2019. Dr. Drucilla Schmidt did feel that potentially this could be secondary to some cellulitis and he recommended subsequently placing the patient on Zyvox which the patient feels like has helped as well. There does not appear to be any signs of active infection at this time which is good news systemically. I am very pleased that Dr. Luther Parody, [facility] M.  ([id number]) was able to see him so quickly I do appreciate this greatly. With that being said he also did recommend an MRI for the patient to evaluate for any deeper signs of infection. That is actually scheduled for this coming Tuesday. He also has a follow-up appoint with Dr. Drucilla Schmidt on Tuesday. The last thing Dr. Drucilla Schmidt did mention was the possibility of more aggressive diuresis. The patient is on torsemide. With that being said this is managed by his cardiologist. I think we may want to get in touch with her clinic and see if possibly this is something that could be increased for a short amount of time depending on their recommendations to see if we can get some additional fluid off of him which in turn would likely help tremendously with his weeping and edema. He is in agreement with this plan if his cardiologist is in agreement. 11/01/2019 on evaluation today patient unfortunately notes that he did not get his MRI done which was ordered by Dr. Drucilla Schmidt. Apparently when he went in it was 6:30 in the morning and they told him that he was there to get an MRI of his toes. With that being said the MRI was ordered of his ankle and foot and so long story short the patient said that he was not getting MRI of his toes and left. Dr. Drucilla Schmidt was alerted and again it does not appear that at all that was what he ordered that he did order the ankle and foot but nonetheless that is beside the point at this time. The patient still has considerable edema noted at this time the nurse that came out last after just put a Kerlix and Coban wrap on the patient she did not even properly wrap him. Subsequently they have also discharged him being that they state he is not skilled at this point. Therefore he is going to have to be seen here in the office only. Fortunately there is no signs of systemic infection he still has a lot of erythema around the ankle and lower extremity region again there are any specific open wounds  just  general weeping and evidence of erythema which Dr. Drucilla Schmidt is questioning whether or not this is even infection or if it is just more secondary to the patient's lymphedema. He has had venous reflux studies which were negative for any abnormal findings unfortunately that is also not good to be an answer for [place] here. I have recommended the patient today contact his primary care provider to see if they can increase his fluid pills and I also think we need to increase his compression wrap to a 4-layer compression wrap to try to more effectively manage his edema. 11/08/2019 on evaluation today patient appears unfortunately to still be doing poorly. I did speak with his primary care provider office last week they were supposed to call him to advise what should be done as far as his fluid pills were concerned. With that being said they did not contact him as far as the patient tells me. I am not really sure exactly what is going on as far as that is concerned. Nonetheless I really think that the patient needs to be admitted to the hospital for treatment likely as I explained to him he is going to require IV Lasix to get fluid off and to be honest also think he may need IV antibiotics. I have made all the outpatient referrals I can to try to get things under control he has been on oral antibiotics I really just have not seen any significant improvement he was even on linezolid. That was prescribed By infectious disease. Overall I am very concerned about the fact that this is not getting better. Patient History Information obtained from Patient. Family History Cancer - Paternal Grandparents, Diabetes - Mother, No family history of Heart Disease, Hypertension, Kidney Disease, Lung Disease, Seizures, Stroke, Thyroid Problems, Tuberculosis. Social History Former smoker - 17 years ago, Marital Status - Married, Alcohol Use - Rarely, Drug Use - No History, Caffeine Use - Daily. Medical  History Cardiovascular Patient has history of Congestive Heart Failure Endocrine Patient has history of Type II Diabetes Musculoskeletal Patient has history of Osteoarthritis - Hands Review of Systems (ROS) Constitutional Symptoms (General Health) Denies complaints or symptoms of Fatigue, Fever, Chills, Marked Weight Change. Respiratory Denies complaints or symptoms of Chronic or frequent coughs, Shortness of Breath. Cardiovascular Complains or has symptoms of LE edema. Denies complaints or symptoms of Chest pain. [name], [name] Psychiatric Denies complaints or symptoms of Anxiety, Claustrophobia. Objective Constitutional Well-nourished and well-hydrated in no acute distress. Vitals Time Taken: 8:51 AM, Height: 69 in, Weight: 295 lbs, BMI: 43.6, Temperature: 98.3 F, Pulse: 84 bpm, Respiratory Rate: 16 breaths/min, Blood Pressure: 154/55 mmHg. Respiratory normal breathing without difficulty. clear to auscultation bilaterally. Cardiovascular regular rate and rhythm with normal S1, S2. Psychiatric this patient is able to make decisions and demonstrates good insight into disease process. Alert and Oriented x 3. pleasant and cooperative. General Notes: His wound currently really is just more of a generalized cellulitis/weeping area of the left lower extremity that has me concerned at this point. I am worried about the possibility of things getting worse and developing into sepsis. The patient states that in the past has not been able to go to the hospital for treatment simply due to the fact that he has to care for his wife who is blind and really has not had anyone to help in that regard. With that being said I discussed with him again today that I do not see this getting better without potentially hospital stay.  Especially with everything we have attempted and tried up to this point to be honest I feel like things are getting worse not better. For that reason he  states that he can probably work on getting something settled as far as getting a grandchild or someone to come stay with her for him to go into the hospital. Other Condition(s) Patient presents with Lymphedema located on the Bilateral Leg. 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 ([id number]) Procedures There was a Four Layer Compression Therapy Procedure with a pre-treatment ABI of 1.2 by Army Melia, RN. Post procedure Diagnosis Wound #: Same as Pre-Procedure Plan Wound Cleansing: Clean wound with Normal Saline. - in office Cleanse wound with mild soap and water - left leg Primary Wound Dressing: Silver Alginate - over weeping areas Secondary Dressing: XtraSorb Dressing Change Frequency: Dressing is to be changed Monday and Thursday. - Nurse visit as needed Follow-up Appointments: Return Appointment in 1 week. Edema Control: 4-Layer Compression System - Left Lower Extremity. Other: - Unna to anchor wrap 1. Based on what I am seeing at this time my suggestion is good to be that the patient go to the hospital for further evaluation and treatment as soon as possible. He tells me that he is get a plan to probably go on Monday although we advised that the best time to go would probably be early Sunday morning to the ER. That tends to be when it is less busy. He notes that that would probably be when he would go then. I really do think a hospital stay is going be required in order to get this under control to be perfectly honest. 2. I would recommend at this time that we continue with the silver alginate dressing to open wound areas followed by Lauraine Rinne and then a 4-layer compression wrap  to try to help with edema control in the meantime. 3. Also recommend the patient continue to try to elevate his legs as much as possible. We will see patient back for reevaluation in 1 week here in the clinic. If anything worsens or changes patient will contact our office for additional recommendations. Though to be honest I really feel like the patient needs to go to the ER ASAP and I would suggest that he go Sunday if he does then honestly he probably will not see [place] until the following week which is okay. He will be getting the right treatment and again I think the ER is the only way hospital stay is probably only way that he is going to show signs of significant improvement and get this on the right track. Electronic Signature(s) Signed: 11/08/2019 9:20:41 AM By: Worthy Keeler PA-C Entered By: Worthy Keeler on 11/08/2019 09:20:41 [name], [name] -------------------------------------------------------------------------------- ROS/PFSH Details Patient Name: [name]. Date of Service: 11/08/2019 8:45 AM Medical Record Number: [id number] Patient Account Number: [id number] Date of Birth/Sex: 1951-04-12 (68 y.o. M) Treating RN: Army Melia Primary Care Provider: Aura Dials Other Clinician: Referring Provider: Aura Dials Treating Provider/Extender: Melburn Hake, Hulbert Branscome Weeks in Treatment: 6 Information Obtained From Patient Constitutional Symptoms (General Health) Complaints and Symptoms: Negative for: Fatigue; Fever; Chills; Marked Weight Change Respiratory Complaints and Symptoms: Negative for: Chronic or frequent coughs; Shortness of Breath Cardiovascular Complaints and Symptoms: Positive for: LE edema Negative for:  Chest pain Medical History: Positive for: Congestive Heart Failure Psychiatric Complaints and Symptoms: Negative for: Anxiety; Claustrophobia Endocrine Medical History: Positive for: Type II Diabetes Time with diabetes: 1  year Treated with: Oral agents Blood sugar tested every day: No Musculoskeletal Medical History: Positive for: Osteoarthritis - Hands Immunizations Pneumococcal Vaccine: Received Pneumococcal Vaccination: No Implantable Devices None Family and Social History [name], [name] Cancer: Yes - Paternal Grandparents; Diabetes: Yes - Mother; Heart Disease: No; Hypertension: No; Kidney Disease: No; Lung Disease: No; Seizures: No; Stroke: No; Thyroid Problems: No; Tuberculosis: No; Former smoker - 51 years ago; Marital Status - Married; Alcohol Use: Rarely; Drug Use: No History; Caffeine Use: Daily Physician Affirmation I have reviewed and agree with the above information. Electronic Signature(s) Signed: 11/08/2019 12:15:27 PM By: Army Melia Signed: 11/08/2019 12:19:39 PM By: Worthy Keeler PA-C Entered By: Worthy Keeler on 11/08/2019 09:18:12 [name], [name] -------------------------------------------------------------------------------- SuperBill Details Patient Name: [name]. Date of Service: 11/08/2019 Medical Record Number: [id number] Patient Account Number: [id number] Date of Birth/Sex: 1951-02-22 (68 y.o. 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 99991111 Essential (primary) hypertension I50.42 Chronic combined systolic (congestive) and diastolic (congestive) heart failure L02.416 Cutaneous abscess of left lower limb Facility Procedures CPT4 Code: [id number] Description: (Facility Use Only) [phone] - [place] LWR LT LEG Modifier: Quantity: 1 Physician Procedures CPT4 Code:  [id number] Description: 99214 - WC PHYS LEVEL 4 - EST PT ICD-10 Diagnosis Description I89.0 Lymphedema, not elsewhere classified I87.2 Venous insufficiency (chronic) (peripheral) E11.628 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

## 2019-11-19 ENCOUNTER — Encounter: Payer: Self-pay | Admitting: Infectious Disease

## 2019-11-19 ENCOUNTER — Other Ambulatory Visit: Payer: Self-pay

## 2019-11-19 ENCOUNTER — Ambulatory Visit (INDEPENDENT_AMBULATORY_CARE_PROVIDER_SITE_OTHER): Payer: Medicare Other | Admitting: Infectious Disease

## 2019-11-19 VITALS — BP 151/87 | HR 90 | Temp 98.0°F | Ht 69.0 in | Wt 305.0 lb

## 2019-11-19 DIAGNOSIS — I872 Venous insufficiency (chronic) (peripheral): Secondary | ICD-10-CM | POA: Diagnosis not present

## 2019-11-19 DIAGNOSIS — Z23 Encounter for immunization: Secondary | ICD-10-CM | POA: Diagnosis not present

## 2019-11-19 DIAGNOSIS — I5032 Chronic diastolic (congestive) heart failure: Secondary | ICD-10-CM | POA: Diagnosis not present

## 2019-11-19 DIAGNOSIS — I10 Essential (primary) hypertension: Secondary | ICD-10-CM | POA: Diagnosis not present

## 2019-11-19 DIAGNOSIS — I890 Lymphedema, not elsewhere classified: Secondary | ICD-10-CM | POA: Diagnosis not present

## 2019-11-19 DIAGNOSIS — E119 Type 2 diabetes mellitus without complications: Secondary | ICD-10-CM

## 2019-11-19 HISTORY — DX: Venous insufficiency (chronic) (peripheral): I87.2

## 2019-11-19 NOTE — Progress Notes (Signed)
Subjective:   Chief complaint: followup for erythematous left lower extremity    Patient ID: [name]., male    DOB: 01-19-1951, 69 y.o.   MRN: [id number]  HPI  [name] is a 69 year old Caucasian male with history of congestive heart failure, orbit obesity chronic lower extremity edema diabetes mellitus.  He has had drainage from his left lower extremity for multiple months as documented in September when he was seen by them on cardiovascular.  He is more recently been followed by wound care and been seen by [name]. he has been given antibiotics in the form of ciprofloxacin without improvement in the erythema or the chronic drainage from his left foot more recently was initiated on doxycycline which has been taking for the last several days.  He has had evaluation by vascular surgery with ABIs which were within normal range.  Wound care provider was concerned about his erythema that he considered admitting the patient through the emergency department with the patient refused stating that he needs to build to take care of his wife who is blind.  He denied smoking he tells me his diabetes is under better control he is followed by [name].  He does take torsemide for his CHF.  Denies fevers chills or systemic symptoms.  On exam when I saw him in December he did  have significant erythema of his left lower extremity and did have foul-smelling drainage in this area but not an overt abscess or ulcer that I could appreciate at that time.  I think much of this may be related to congestive heart failure and venous stasis though certainly his erythema has been much more impressive on the left side versus the right.  I  Checked plain films which were normal and inflammatory markers which were also normal. I exchanged doxycycline for zyvox but he felt that the zyvox made little impact on his erythema though I feel there are conflicting accounts the EMR.  He has been seen again by  [facility] and wound care.    Past Medical History:  Diagnosis Date  . Arthritis   . Back pain    occasionally  . BPH (benign prostatic hypertrophy)    takes Uroxatral daily as well as Finasteride   . Diabetes mellitus without complication ([place])    takes Metformin daily  . Diabetic foot infection ([place]) 10/22/2019  . GERD (gastroesophageal reflux disease)    no meds  . Hepatitis hx of   at age 83  . Hypertension    takes Losartan and HCTZ daily  . Joint pain   . Lymphedema 10/22/2019  . Urinary frequency   . Urinary urgency   . Venous stasis dermatitis 11/19/2019    Past Surgical History:  Procedure Laterality Date  . APPENDECTOMY     age 1  . COLONOSCOPY    . KNEE ARTHROSCOPY  1984   right and left   . SHOULDER ARTHROSCOPY  2003   right RCR-GSC-stayed overnight-had SOB  . SHOULDER ARTHROSCOPY WITH ROTATOR CUFF REPAIR AND SUBACROMIAL DECOMPRESSION Left 03/13/2013   Procedure: LEFT SHOULDER ARTHROSCOPY WITH SUBACROMIAL DECOMPRESSION, DISTAL CLAVICLE RESECTION AND REPAIR ROTATOR CUFF AND LABRAL DEBRIDEMENT;  Surgeon: [name]., MD;  Location: [facility];  Service: Orthopedics;  Laterality: Left;  . TOTAL HIP ARTHROPLASTY Right 01/29/2015   Procedure: TOTAL HIP ARTHROPLASTY ANTERIOR APPROACH;  Surgeon: [name], MD;  Location: [facility];  Service: Orthopedics;  Laterality: Right;  Marland Kitchen VASECTOMY    .  VASECTOMY REVERSAL  1993    Family History  Problem Relation Age of Onset  . Benign prostatic hyperplasia Father   . Kidney cancer Neg Hx   . Kidney disease Neg Hx       Social History   Socioeconomic History  . Marital status: Married    Spouse name: Not on file  . Number of children: 3  . Years of education: Not on file  . Highest education level: Not on file  Occupational History  . Not on file  Tobacco Use  . Smoking status: Never Smoker  . Smokeless tobacco: Former Network engineer and Sexual Activity  . Alcohol use: Yes    Alcohol/week: 0.0  standard drinks    Comment: occasionally beer or scotch  . Drug use: No  . Sexual activity: Yes  Other Topics Concern  . Not on file  Social History Narrative  . Not on file   Social Determinants of Health   Financial Resource Strain:   . Difficulty of Paying Living Expenses: Not on file  Food Insecurity:   . Worried About Charity fundraiser in the Last Year: Not on file  . Ran Out of Food in the Last Year: Not on file  Transportation Needs:   . Lack of Transportation (Medical): Not on file  . Lack of Transportation (Non-Medical): Not on file  Physical Activity:   . Days of Exercise per Week: Not on file  . Minutes of Exercise per Session: Not on file  Stress:   . Feeling of Stress : Not on file  Social Connections:   . Frequency of Communication with Friends and Family: Not on file  . Frequency of Social Gatherings with Friends and Family: Not on file  . Attends Religious Services: Not on file  . Active Member of Clubs or Organizations: Not on file  . Attends Archivist Meetings: Not on file  . Marital Status: Not on file    Allergies  Allergen Reactions  . Codeine Swelling    Water retention     Current Outpatient Medications:  .  Cholecalciferol (VITAMIN D3) 5000 UNITS TABS, Take 1 tablet by mouth daily. Reported on 04/29/2016, Disp: , Rfl:  .  doxycycline (VIBRAMYCIN) 100 MG capsule, Take 100 mg by mouth 2 (two) times daily., Disp: , Rfl:  .  finasteride (PROSCAR) 5 MG tablet, Take 5 mg by mouth daily., Disp: , Rfl:  .  GABAPENTIN PO, Take by mouth as needed., Disp: , Rfl:  .  glipiZIDE (GLUCOTROL XL) 5 MG 24 hr tablet, Take 5 mg by mouth 3 (three) times daily with meals., Disp: , Rfl:  .  linezolid (ZYVOX) 600 MG tablet, Take 1 tablet (600 mg total) by mouth 2 (two) times daily. (Patient not taking: Reported on 11/19/2019), Disp: 28 tablet, Rfl: 1 .  metoprolol succinate (TOPROL-XL) 50 MG 24 hr tablet, TAKE 1 TABLET BY MOUTH  DAILY WITH OR IMMEDIATLEY   FOLLOWING A MEAL, Disp: 90 tablet, Rfl: 2 .  potassium chloride SA (KLOR-CON) 20 MEQ tablet, Take 1 tablet by mouth daily, Disp: 90 tablet, Rfl: 1 .  torsemide (DEMADEX) 20 MG tablet, Take 2 tablets (40 mg total) by mouth daily., Disp: 180 tablet, Rfl: 3   Review of Systems  Constitutional: Negative for activity change, appetite change, chills, diaphoresis, fatigue, fever and unexpected weight change.  HENT: Negative for congestion, rhinorrhea, sinus pressure, sneezing, sore throat and trouble swallowing.   Eyes: Negative for photophobia and visual  disturbance.  Respiratory: Negative for cough, chest tightness, shortness of breath, wheezing and stridor.   Cardiovascular: Positive for leg swelling. Negative for chest pain and palpitations.  Gastrointestinal: Negative for abdominal distention, abdominal pain, anal bleeding, blood in stool, constipation, diarrhea, nausea and vomiting.  Genitourinary: Negative for difficulty urinating, dysuria, flank pain and hematuria.  Musculoskeletal: Negative for arthralgias, back pain, gait problem, joint swelling and myalgias.  Skin: Positive for color change. Negative for pallor, rash and wound.  Neurological: Negative for dizziness, tremors, weakness and light-headedness.  Hematological: Negative for adenopathy. Does not bruise/bleed easily.  Psychiatric/Behavioral: Negative for agitation, behavioral problems, confusion, decreased concentration, dysphoric mood and sleep disturbance.       Objective:   Physical Exam Constitutional:      Appearance: He is well-developed. He is obese.  HENT:     Head: Normocephalic and atraumatic.  Eyes:     General: No scleral icterus.    Extraocular Movements: Extraocular movements intact.     Conjunctiva/sclera: Conjunctivae normal.  Cardiovascular:     Rate and Rhythm: Normal rate and regular rhythm.  Pulmonary:     Effort: Pulmonary effort is normal. No respiratory distress.     Breath sounds: No wheezing.    Abdominal:     General: There is no distension.     Palpations: Abdomen is soft.  Musculoskeletal:        General: No tenderness. Normal range of motion.     Cervical back: Normal range of motion and neck supple.  Skin:    General: Skin is warm and dry.     Coloration: Skin is not pale.     Findings: Erythema present. No rash.  Neurological:     General: No focal deficit present.     Mental Status: He is alert and oriented to person, place, and time.  Psychiatric:        Mood and Affect: Mood normal.        Behavior: Behavior normal.        Thought Content: Thought content normal.    Left leg 10/22/2019:           Right left12/05/2019:    Legs today 11/19/2019:            Assessment & Plan:   Left leg with erythema and drainage:   Given lack of fevers or pain, or systemic symptoms and lack of response to antibiotics I think this all points to    this IS IN FACT largely an issue of venous stasis dermatitis  I would like him to see his Cardiologist and ensure maximum diuresis  Continue wound care and compression hose  Weight loss through supervised carbohydrate restriction   He may need antibiotics from time to time and is at risk for cellulitis  Should he have such a flare I would recommend keflex 500mg  qid or augmentin BID as good optoins for nonpurulent cellulitis.

## 2019-11-22 ENCOUNTER — Telehealth: Payer: Self-pay

## 2019-11-22 ENCOUNTER — Other Ambulatory Visit: Payer: Self-pay

## 2019-11-22 ENCOUNTER — Encounter: Payer: Medicare Other | Attending: Physician Assistant | Admitting: Physician Assistant

## 2019-11-22 DIAGNOSIS — L02416 Cutaneous abscess of left lower limb: Secondary | ICD-10-CM | POA: Diagnosis not present

## 2019-11-22 DIAGNOSIS — I872 Venous insufficiency (chronic) (peripheral): Secondary | ICD-10-CM | POA: Diagnosis not present

## 2019-11-22 DIAGNOSIS — E669 Obesity, unspecified: Secondary | ICD-10-CM | POA: Insufficient documentation

## 2019-11-22 DIAGNOSIS — Z6841 Body Mass Index (BMI) 40.0 and over, adult: Secondary | ICD-10-CM | POA: Insufficient documentation

## 2019-11-22 DIAGNOSIS — E11628 Type 2 diabetes mellitus with other skin complications: Secondary | ICD-10-CM | POA: Diagnosis not present

## 2019-11-22 DIAGNOSIS — I110 Hypertensive heart disease with heart failure: Secondary | ICD-10-CM | POA: Insufficient documentation

## 2019-11-22 DIAGNOSIS — L03116 Cellulitis of left lower limb: Secondary | ICD-10-CM | POA: Insufficient documentation

## 2019-11-22 DIAGNOSIS — I5042 Chronic combined systolic (congestive) and diastolic (congestive) heart failure: Secondary | ICD-10-CM | POA: Diagnosis not present

## 2019-11-22 DIAGNOSIS — I890 Lymphedema, not elsewhere classified: Secondary | ICD-10-CM | POA: Insufficient documentation

## 2019-11-22 NOTE — Telephone Encounter (Signed)
Discussed patient with wound care, Jeri Cos, PA, concerned about continued stasis dermatitis and unresponsive to treatments. Suspect non-compliance to also be contributing as well as continued edema. We discussed possible hospital admission; however, patient is wanting to avoid due to being the main caregiver for his wife. I will plan to try to aggressively diuresis in the outpatient setting and if no improvement can further consider inpatient. Will increase his Torsemide to 40 mg BID. Needs to significantly work on his diet with following low sodium diet and leg elevation. I will plan to see him back in the office next week and make further recommendations.

## 2019-11-22 NOTE — Progress Notes (Signed)
CAINE, SIHARATH ([id number]) Visit Report for 11/22/2019 Arrival Information Details Patient Name: [name], [name]. Date of Service: 11/22/2019 8:30 AM Medical Record Number: [id number] Patient Account Number: [id number] Date of Birth/Sex: [birth year] (69 y.o. M) Treating RN: Army Melia Primary Care Zhavia Cunanan: Aura Dials Other Clinician: Referring Joelie Schou: Aura Dials Treating Bearl Talarico/Extender: Melburn Hake, HOYT Weeks in Treatment: 8 Visit Information History Since Last Visit Added or deleted any medications: No Patient Arrived: Ambulatory Any new allergies or adverse reactions: No Arrival Time: 08:38 Had a fall or experienced change in No Accompanied By: self activities of daily living that may affect Transfer Assistance: None risk of falls: Patient Identification Verified: Yes Signs or symptoms of abuse/neglect since last visito No Secondary Verification Process Yes Hospitalized since last visit: No Completed: Implantable device outside of the clinic excluding No Patient Requires Transmission-Based No cellular tissue based products placed in the center Precautions: since last visit: Patient Has Alerts: Yes Has Dressing in Place as Prescribed: Yes Patient Alerts: DM II Has Compression in Place as Prescribed: Yes ABI11/18/20 L 1.25 R Pain Present Now: No 1.19 Electronic Signature(s) Signed: 11/22/2019 4:13:51 PM By: Lorine Bears RCP, RRT, CHT Entered By: Becky Sax, Amado Nash on 11/22/2019 08:39:45 Strohm, [name] ([id number]) -------------------------------------------------------------------------------- Compression Therapy Details Patient Name: [name]. Date of Service: 11/22/2019 8:30 AM Medical Record Number: [id number] Patient Account Number: [id number] Date of Birth/Sex: September 18, 1951 (69 y.o. M) Treating RN: Army Melia Primary Care Monae Topping: Aura Dials Other Clinician: Referring Stephania Macfarlane: Aura Dials Treating  Elayna Tobler/Extender: Melburn Hake, HOYT Weeks in Treatment: 8 Compression Therapy Performed for Wound Assessment: NonWound Condition Lymphedema - Bilateral Leg Performed By: Clinician Army Melia, RN Compression Type: Four Layer Post Procedure Diagnosis Same as Pre-procedure Electronic Signature(s) Signed: 11/22/2019 11:26:15 AM By: Army Melia Entered By: Army Melia on 11/22/2019 09:01:24 Richardson, [name] ([id number]) -------------------------------------------------------------------------------- Encounter Discharge Information Details Patient Name: [name]. Date of Service: 11/22/2019 8:30 AM Medical Record Number: [id number] Patient Account Number: [id number] Date of Birth/Sex: [birth year] (69 y.o. M) Treating RN: Army Melia Primary Care Tayloranne Lekas: Aura Dials Other Clinician: Referring Terrie Haring: Aura Dials Treating Adler Chartrand/Extender: Melburn Hake, HOYT Weeks in Treatment: 8 Encounter Discharge Information Items Discharge Condition: Stable Ambulatory Status: Ambulatory Discharge Destination: Home Transportation: Other Accompanied By: self Schedule Follow-up Appointment: Yes Clinical Summary of Care: Electronic Signature(s) Signed: 11/22/2019 11:26:15 AM By: Army Melia Entered By: Army Melia on 11/22/2019 09:03:11 Renken, [name] ([id number]) -------------------------------------------------------------------------------- Lower Extremity Assessment Details Patient Name: [name]. Date of Service: 11/22/2019 8:30 AM Medical Record Number: [id number] Patient Account Number: [id number] Date of Birth/Sex: 1950-11-18 (69 y.o. M) Treating RN: Montey Hora Primary Care Fatime Biswell: Aura Dials Other Clinician: Referring Kiani Wurtzel: Aura Dials Treating Damika Harmon/Extender: Melburn Hake, HOYT Weeks in Treatment: 8 Edema Assessment Assessed: [Left: No] [Right: No] Edema: [Left: Ye] [Right: s] Calf Left: Right: Point of Measurement: 36 cm From Medial Instep 54  cm cm Ankle Left: Right: Point of Measurement: 12 cm From Medial Instep 31 cm cm Vascular Assessment Pulses: Dorsalis Pedis Palpable: [Left:Yes] Electronic Signature(s) Signed: 11/22/2019 4:40:20 PM By: Montey Hora Entered By: Montey Hora on 11/22/2019 08:47:37 Hitch, [name] ([id number]) -------------------------------------------------------------------------------- Multi Wound Chart Details Patient Name: [name]. Date of Service: 11/22/2019 8:30 AM Medical Record Number: [id number] Patient Account Number: [id number] Date of Birth/Sex: 1951-07-18 (69 y.o. M) Treating RN: Army Melia Primary Care Avaley Coop: Aura Dials Other Clinician: Referring Shilpa Bushee: Aura Dials Treating Alexah Kivett/Extender: STONE III, HOYT Weeks in Treatment: 8 Vital Signs Height(in): 69 Pulse(bpm):  81 Weight(lbs): 295 Blood Pressure(mmHg): 135/62 Body Mass Index(BMI): 44 Temperature(F): 98.3 Respiratory Rate 18 (breaths/min): Wound Assessments Treatment Notes Electronic Signature(s) Signed: 11/22/2019 11:26:15 AM By: Army Melia Entered By: Army Melia on 11/22/2019 08:58:39 Zingale, [name] ([id number]) -------------------------------------------------------------------------------- Multi-Disciplinary Care Plan Details Patient Name: [name], [name]. Date of Service: 11/22/2019 8:30 AM Medical Record Number: [id number] Patient Account Number: [id number] Date of Birth/Sex: [birth year] (69 y.o. M) Treating RN: Army Melia Primary Care Duyen Beckom: Aura Dials Other Clinician: Referring Khadir Roam: Aura Dials Treating Ramey Schiff/Extender: Melburn Hake, HOYT Weeks in Treatment: 8 Active Inactive Nutrition Nursing Diagnoses: Impaired glucose control: actual or potential Goals: Patient/caregiver verbalizes understanding of need to maintain therapeutic glucose control per primary care physician Date Initiated: 09/27/2019 Target Resolution Date: 10/19/2019 Goal Status:  Active Interventions: Assess HgA1c results as ordered upon admission and as needed Notes: Orientation to the Wound Care Program Nursing Diagnoses: Knowledge deficit related to the wound healing center program Goals: Patient/caregiver will verbalize understanding of the [place] Date Initiated: 09/27/2019 Target Resolution Date: 10/19/2019 Goal Status: Active Interventions: Provide education on orientation to the wound center Notes: Wound/Skin Impairment Nursing Diagnoses: Impaired tissue integrity Goals: Ulcer/skin breakdown will have a volume reduction of 30% by week 4 Date Initiated: 09/27/2019 Target Resolution Date: 10/25/2019 Goal Status: Active Interventions: Assess ulceration(s) every visit [name], [name] Notes: Electronic Signature(s) Signed: 11/22/2019 11:26:15 AM By: Army Melia Entered By: Army Melia on 11/22/2019 08:58:32 Betsill, Daiki M. ([id number]) -------------------------------------------------------------------------------- Non-Wound Condition Assessment Details Patient Name: [name]. Date of Service: 11/22/2019 8:30 AM Medical Record Number: [id number] Patient Account Number: [id number] Date of Birth/Sex: 1951-04-23 (69 y.o. M) Treating RN: Montey Hora Primary Care Katisha Shimizu: Aura Dials Other Clinician: Referring Collier Monica: Aura Dials Treating Cruzito Standre/Extender: STONE III, HOYT Weeks in Treatment: 8 Non-Wound Condition: Condition: Lymphedema Location: Leg Side: Bilateral Photos Electronic Signature(s) Signed: 11/22/2019 4:40:20 PM By: Montey Hora Entered By: Montey Hora on 11/22/2019 08:48:31 Biegler, [name] ([id number]) -------------------------------------------------------------------------------- Pain Assessment Details Patient Name: [name]. Date of Service: 11/22/2019 8:30 AM Medical Record Number: [id number] Patient Account Number: [id number] Date of Birth/Sex: [birth year] (69  y.o. M) Treating RN: Army Melia Primary Care Allan Bacigalupi: Aura Dials Other Clinician: Referring Saga Balthazar: Aura Dials Treating Cassandra Harbold/Extender: Melburn Hake, HOYT Weeks in Treatment: 8 Active Problems Location of Pain Severity and Description of Pain Patient Has Paino No Site Locations Pain Management and Medication Current Pain Management: Electronic Signature(s) Signed: 11/22/2019 11:26:15 AM By: Army Melia Signed: 11/22/2019 4:13:51 PM By: Lorine Bears RCP, RRT, CHT Entered By: Lorine Bears on 11/22/2019 08:39:52 Segovia, [name] ([id number]) -------------------------------------------------------------------------------- Patient/Caregiver Education Details Patient Name: [name]. Date of Service: 11/22/2019 8:30 AM Medical Record Number: [id number] Patient Account Number: [id number] Date of Birth/Gender: 09-25-1951 (69 y.o. M) Treating RN: Army Melia Primary Care Physician: Aura Dials Other Clinician: Referring Physician: Aura Dials Treating Physician/Extender: Sharalyn Ink in Treatment: 8 Education Assessment Education Provided To: Patient Education Topics Provided Wound/Skin Impairment: Handouts: Caring for Your Ulcer Methods: Demonstration, Explain/Verbal Responses: State content correctly Electronic Signature(s) Signed: 11/22/2019 11:26:15 AM By: Army Melia Entered By: Army Melia on 11/22/2019 09:02:19 Bridge, [name] ([id number]) -------------------------------------------------------------------------------- [place] Details Patient Name: [name]. Date of Service: 11/22/2019 8:30 AM Medical Record Number: [id number] Patient Account Number: [id number] Date of Birth/Sex: [birth year] (69 y.o. M) Treating RN: Army Melia Primary Care Lizania Bouchard: Aura Dials Other Clinician: Referring Yanelis Osika: Aura Dials Treating Ryon Layton/Extender: STONE III, HOYT Weeks in Treatment: 8 Vital Signs Time  Taken: 08:40 Temperature (F): 98.3 Height (in): 69 Pulse (bpm): 81 Weight (lbs): 295 Respiratory Rate (breaths/min): 18 Body Mass Index (BMI): 43.6 Blood Pressure (mmHg): 135/62 Reference Range: 80 - 120 mg / dl Electronic Signature(s) Signed: 11/22/2019 4:13:51 PM By: Lorine Bears RCP, RRT, CHT Entered By: Becky Sax, Amado Nash on 11/22/2019 08:42:49

## 2019-11-22 NOTE — Progress Notes (Addendum)
[name], [name] Visit Report for 11/22/2019 Chief Complaint Document Details Patient Name: [name], [name]. Date of Service: 11/22/2019 8:30 AM Medical Record Number: [id number] Patient Account Number: [id number] Date of Birth/Sex: 03-Oct-1951 (69 y.o. M) Treating RN: Army Melia Primary Care Provider: Aura Dials Other Clinician: Referring Provider: Aura Dials Treating Provider/Extender: Melburn Hake, HOYT Weeks in Treatment: 8 Information Obtained from: Patient Chief Complaint Bilateral LE lymphedema left leg cellulitis Electronic Signature(s) Signed: 11/22/2019 8:54:13 AM By: Worthy Keeler PA-C Entered By: Worthy Keeler on 11/22/2019 08:54:12 [name], [name] -------------------------------------------------------------------------------- HPI Details Patient Name: [name]. Date of Service: 11/22/2019 8:30 AM Medical Record Number: [id number] Patient Account Number: [id number] Date of Birth/Sex: 28-Mar-1951 (69 y.o. M) Treating RN: Army Melia Primary Care Provider: Aura Dials Other Clinician: Referring Provider: Aura Dials Treating Provider/Extender: Melburn Hake, HOYT Weeks in Treatment: 8 History of Present Illness HPI Description: 09/27/2019 on evaluation today patient appears to be doing poorly in regard to his bilateral lower extremities he does have bilateral lower extremity lymphedema. With that being said he also has a history of diabetes type 2, hypertension, congestive heart failure, and apparently has chronic venous insufficiency which has led to the lymphedema. Currently he also shows signs of an infection of the left lower extremity which does have me concerned at this point as well. He tells me that he is not really having any significant pain but has had a lot of trouble with his legs in general. He does have a compression stocking he is wearing on the right lower extremity he was wrapped with an Unna boot on the left  lower extremity. The patient tells me that overall he has been doing with this for several weeks and home health has been initiated. He has not been on any antibiotics yet and he tells me that the home health nurse was not sure that it was infected based on what I see today I am concerned that this likely is infected at this point. Fortunately however there does not appear to be any evidence of systemic infection which is good news. The Unna boot does seem to be helping control the edema although he may benefit from a stronger compression wrap I think that at this point without having the ABIs completed the Unna boot is where I would stand. He is supposed to be having an arterial study with ABI as ordered by his cardiologist but he states he may have missed that appointment and has not called to reschedule he needs to contact them to get this rescheduled as soon as possible I explained to him. He tells me that he is draining a lot as far as the left lower extremity is concerned and that is something that he feels like is not lasting very long as far as the wraps are concerned when they put him on they seem to get wet extremely quickly. 10/04/2019 on evaluation today patient appears to be doing better compared to last week's evaluation. Fortunately there is no signs of active infection at this time. He has been tolerating the dressing changes without complication. He did undergo arterial studies yesterday and has an appointment tomorrow with the vascular specialist. With that being said his ABI on the left was 1.25 on the right 1.19 with a TBI of 0.61 on the left and 0.69 on the right. There was stated to be no evidence of obvious significant arterial disease and the patient had pretty much triphasic blood flow throughout. Overall  I am very pleased with how things seem to be progressing. We will see if the vascular doctor says anything different tomorrow but I even at this point I feel like he would  do well with a stronger compression wrap to try to get some the fluid out of his leg at this point. The good news is his drainage has slowed down considerably I do believe the antibiotics have been beneficial for him. 10/18/2019 on evaluation today patient appears to be doing poorly in regard to his left lower extremity. He still having a lot of drainage and weeping from his leg this appears to be much more erythematous and in fact I am concerned that the erythema may be spreading up his leg as well. He has not experienced any fevers, chills, nausea, vomiting, or diarrhea up to this point. I explained to him that I really feel like based on what I am seeing he may need to potentially go to the hospital for evaluation and treatment potentially even with IV antibiotics depending on what they see. With that being said he tells me that he is not really able to do that due to the fact that he is the primary caregiver for his wife who is legally blind. He states he is not good to be able to have anything to help as far as caring for her if he were to go into the hospital. Nonetheless he is unsure what to do in this regard. I really think that he needs faster treatment. For that reason I am going to likely try to get in touch with the infectious disease office to see if I can get him in sooner than [place]. 10/25/2019 on evaluation today patient appears to be doing a little better in regard to his weeping and edema compared to the last time I saw him. The dorsal surface of his foot is a little bit drier compared to what was this is good news. He did see Dr. Drucilla Schmidt on 10/22/2019. Dr. Drucilla Schmidt did feel that potentially this could be secondary to some cellulitis and he recommended subsequently placing the patient on Zyvox which the patient feels like has helped as well. There does not appear to be any signs of active infection at this time which is good news systemically. I am very pleased that Dr. Drucilla Schmidt was  able to see him so quickly I do appreciate this greatly. With that being said he also did recommend an MRI for the patient to evaluate for any deeper signs of infection. That is actually scheduled for this coming Tuesday. He also has a follow-up appoint with Dr. Drucilla Schmidt on Tuesday. The last thing Dr. Drucilla Schmidt did mention was the possibility of more aggressive diuresis. The patient is on torsemide. With that being said this is managed by his cardiologist. I think we may want to get in touch with her clinic and see if possibly this is something that could be increased for a short amount of time depending on their recommendations to see if we can get some additional fluid off of him which in turn would likely help tremendously with his JAYVEN, BOERSMA. ([id number]) weeping and edema. He is in agreement with this plan if his cardiologist is in agreement. 11/01/2019 on evaluation today patient unfortunately notes that he did not get his MRI done which was ordered by Dr. Drucilla Schmidt. Apparently when he went in it was 6:30 in the morning and they told him that he was there to get an MRI of  his toes. With that being said the MRI was ordered of his ankle and foot and so long story short the patient said that he was not getting MRI of his toes and left. Dr. Drucilla Schmidt was alerted and again it does not appear that at all that was what he ordered that he did order the ankle and foot but nonetheless that is beside the point at this time. The patient still has considerable edema noted at this time the nurse that came out last after just put a Kerlix and Coban wrap on the patient she did not even properly wrap him. Subsequently they have also discharged him being that they state he is not skilled at this point. Therefore he is going to have to be seen here in the office only. Fortunately there is no signs of systemic infection he still has a lot of erythema around the ankle and lower extremity region again there are any  specific open wounds just general weeping and evidence of erythema which Dr. Drucilla Schmidt is questioning whether or not this is even infection or if it is just more secondary to the patient's lymphedema. He has had venous reflux studies which were negative for any abnormal findings unfortunately that is also not good to be an answer for [place] here. I have recommended the patient today contact his primary care provider to see if they can increase his fluid pills and I also think we need to increase his compression wrap to a 4-layer compression wrap to try to more effectively manage his edema. 11/08/2019 on evaluation today patient appears unfortunately to still be doing poorly. I did speak with his primary care provider office last week they were supposed to call him to advise what should be done as far as his fluid pills were concerned. With that being said they did not contact him as far as the patient tells me. I am not really sure exactly what is going on as far as that is concerned. Nonetheless I really think that the patient needs to be admitted to the hospital for treatment likely as I explained to him he is going to require IV Lasix to get fluid off and to be honest also think he may need IV antibiotics. I have made all the outpatient referrals I can to try to get things under control he has been on oral antibiotics I really just have not seen any significant improvement he was even on linezolid. That was prescribed By infectious disease. Overall I am very concerned about the fact that this is not getting better. 11/22/2019 on evaluation today patient unfortunately is not doing very well with regard to his lower extremity edema. The left lower extremity is significantly swollen and still significantly erythematous. He has bright green drainage. He did just see Dr. Drucilla Schmidt on 11/19/2019 and Dr. Drucilla Schmidt did not feel like that this was infected. Subsequently he states that he feels like the issue is more 1 of  not being at maximal diuresis and recommended the patient see his cardiologist to ensure that this was achieved. Subsequently Dr. Drucilla Schmidt states this is a issue with venous stasis dermatitis which again I completely understand that that may definitely be the case but unfortunately we've not been able to get things under control even with maximum compression at a 4 layer compression wrap. The patient is still having significant issues. Either way I do think the patient is getting need to follow-up with his cardiologist to see what we can do Dr. Drucilla Schmidt  did make mention that the patient had a flareup of infection that Keflex 500 mg 4 times a day or Augmentin twice a day would be good options for nonpurulent cellulitis. The patient was again supposed to have gone to the hospital on the weekend following Christmas after he got things settled with family as far as getting someone to take care of his wife. With that being said he tells me that when it came around to that time he just didn't think about it. When asked him about why the drainage from his leg and the issues he is having that and remind him he told me "well it really hasn't changed". Electronic Signature(s) Signed: 11/22/2019 9:17:10 AM By: Worthy Keeler PA-C Entered By: Worthy Keeler on 11/22/2019 09:17:09 [name], [name] -------------------------------------------------------------------------------- Physical Exam Details Patient Name: [name], [name]. Date of Service: 11/22/2019 8:30 AM Medical Record Number: [id number] Patient Account Number: [id number] Date of Birth/Sex: 1951-05-20 (69 y.o. M) Treating RN: Army Melia Primary Care Provider: Aura Dials Other Clinician: Referring Provider: Aura Dials Treating Provider/Extender: STONE III, HOYT Weeks in Treatment: 8 Constitutional Well-nourished and well-hydrated in no acute distress. Respiratory normal breathing without difficulty. Psychiatric this patient  is able to make decisions and demonstrates good insight into disease process. Alert and Oriented x 3. pleasant and cooperative. Notes Upon evaluation today patient has significant erythema of the left lower extremity and again stasis dermatitis is what Dr. Drucilla Schmidt feels like this is as opposed any infection type process occurring. Nonetheless the patient does have significant weeping and we're having a very difficult time trying to keep this area dry even with a 4-layer compression wrap and super absorbent dressings on top of this. He really does not have any specific open wounds. Electronic Signature(s) Signed: 11/22/2019 9:17:58 AM By: Worthy Keeler PA-C Entered By: Worthy Keeler on 11/22/2019 09:17:58 [name], [name] -------------------------------------------------------------------------------- Physician Orders Details Patient Name: [name]. Date of Service: 11/22/2019 8:30 AM Medical Record Number: [id number] Patient Account Number: [id number] Date of Birth/Sex: 1951-02-07 (69 y.o. M) Treating RN: Army Melia Primary Care Provider: Aura Dials Other Clinician: Referring Provider: Aura Dials Treating Provider/Extender: Melburn Hake, HOYT Weeks in Treatment: 8 Verbal / Phone Orders: No Diagnosis Coding ICD-10 Coding Code Description I89.0 Lymphedema, not elsewhere classified I87.2 Venous insufficiency (chronic) (peripheral) E11.628 Type 2 diabetes mellitus with other skin complications 99991111 Essential (primary) hypertension I50.42 Chronic combined systolic (congestive) and diastolic (congestive) heart failure L02.416 Cutaneous abscess of left lower limb Wound Cleansing o Clean wound with Normal Saline. - in office o Cleanse wound with mild soap and water - left leg Primary Wound Dressing o Silver Alginate - over weeping areas Secondary Dressing o XtraSorb Dressing Change Frequency o Dressing is to be changed Monday and Thursday. - Nurse  visit as needed Follow-up Appointments o Return Appointment in 1 week. Edema Control o 4-Layer Compression System - Left Lower Extremity. o Other: - Unna to Adult nurse) Signed: 11/22/2019 11:26:15 AM By: Army Melia Signed: 11/23/2019 9:46:35 AM By: Worthy Keeler PA-C Entered By: Army Melia on 11/22/2019 09:01:55 [name], [name] -------------------------------------------------------------------------------- Problem List Details Patient Name: [name], [name]. Date of Service: 11/22/2019 8:30 AM Medical Record Number: [id number] Patient Account Number: [id number] Date of Birth/Sex: [birth year] (69 y.o. M) Treating RN: Army Melia Primary Care Provider: Aura Dials Other Clinician: Referring Provider: Aura Dials Treating Provider/Extender: Melburn Hake, HOYT Weeks in Treatment: 8 Active Problems ICD-10 Evaluated Encounter Code Description  Active Date Today Diagnosis I89.0 Lymphedema, not elsewhere classified 09/27/2019 No Yes I87.2 Venous insufficiency (chronic) (peripheral) 09/27/2019 No Yes E11.628 Type 2 diabetes mellitus with other skin complications 123XX123 No Yes I10 Essential (primary) hypertension 09/27/2019 No Yes I50.42 Chronic combined systolic (congestive) and diastolic 123XX123 No Yes (congestive) heart failure L02.416 Cutaneous abscess of left lower limb 09/27/2019 No Yes Inactive Problems Resolved Problems Electronic Signature(s) Signed: 11/22/2019 8:54:07 AM By: Worthy Keeler PA-C Entered By: Worthy Keeler on 11/22/2019 08:54:06 [name], [name] -------------------------------------------------------------------------------- Progress Note Details Patient Name: [name]. Date of Service: 11/22/2019 8:30 AM Medical Record Number: [id number] Patient Account Number: [id number] Date of Birth/Sex: 03-14-1951 (69 y.o. M) Treating RN: Army Melia Primary Care Provider: Aura Dials Other  Clinician: Referring Provider: Aura Dials Treating Provider/Extender: Melburn Hake, HOYT Weeks in Treatment: 8 Subjective Chief Complaint Information obtained from Patient Bilateral LE lymphedema left leg cellulitis History of Present Illness (HPI) 09/27/2019 on evaluation today patient appears to be doing poorly in regard to his bilateral lower extremities he does have bilateral lower extremity lymphedema. With that being said he also has a history of diabetes type 2, hypertension, congestive heart failure, and apparently has chronic venous insufficiency which has led to the lymphedema. Currently he also shows signs of an infection of the left lower extremity which does have me concerned at this point as well. He tells me that he is not really having any significant pain but has had a lot of trouble with his legs in general. He does have a compression stocking he is wearing on the right lower extremity he was wrapped with an Unna boot on the left lower extremity. The patient tells me that overall he has been doing with this for several weeks and home health has been initiated. He has not been on any antibiotics yet and he tells me that the home health nurse was not sure that it was infected based on what I see today I am concerned that this likely is infected at this point. Fortunately however there does not appear to be any evidence of systemic infection which is good news. The Unna boot does seem to be helping control the edema although he may benefit from a stronger compression wrap I think that at this point without having the ABIs completed the Unna boot is where I would stand. He is supposed to be having an arterial study with ABI as ordered by his cardiologist but he states he may have missed that appointment and has not called to reschedule he needs to contact them to get this rescheduled as soon as possible I explained to him. He tells me that he is draining a lot as far as the left  lower extremity is concerned and that is something that he feels like is not lasting very long as far as the wraps are concerned when they put him on they seem to get wet extremely quickly. 10/04/2019 on evaluation today patient appears to be doing better compared to last week's evaluation. Fortunately there is no signs of active infection at this time. He has been tolerating the dressing changes without complication. He did undergo arterial studies yesterday and has an appointment tomorrow with the vascular specialist. With that being said his ABI on the left was 1.25 on the right 1.19 with a TBI of 0.61 on the left and 0.69 on the right. There was stated to be no evidence of obvious significant arterial disease and the patient had pretty  much triphasic blood flow throughout. Overall I am very pleased with how things seem to be progressing. We will see if the vascular doctor says anything different tomorrow but I even at this point I feel like he would do well with a stronger compression wrap to try to get some the fluid out of his leg at this point. The good news is his drainage has slowed down considerably I do believe the antibiotics have been beneficial for him. 10/18/2019 on evaluation today patient appears to be doing poorly in regard to his left lower extremity. He still having a lot of drainage and weeping from his leg this appears to be much more erythematous and in fact I am concerned that the erythema may be spreading up his leg as well. He has not experienced any fevers, chills, nausea, vomiting, or diarrhea up to this point. I explained to him that I really feel like based on what I am seeing he may need to potentially go to the hospital for evaluation and treatment potentially even with IV antibiotics depending on what they see. With that being said he tells me that he is not really able to do that due to the fact that he is the primary caregiver for his wife who is legally blind. He  states he is not good to be able to have anything to help as far as caring for her if he were to go into the hospital. Nonetheless he is unsure what to do in this regard. I really think that he needs faster treatment. For that reason I am going to likely try to get in touch with the infectious disease office to see if I can get him in sooner than [place]. 10/25/2019 on evaluation today patient appears to be doing a little better in regard to his weeping and edema compared to the last time I saw him. The dorsal surface of his foot is a little bit drier compared to what was this is good news. He did see Dr. Drucilla Schmidt on 10/22/2019. Dr. Drucilla Schmidt did feel that potentially this could be secondary to some cellulitis and he recommended subsequently placing the patient on Zyvox which the patient feels like has helped as well. There does not appear to be any signs of active infection at this time which is good news systemically. I am very pleased that Dr. Luther Parody, [facility] M. ([id number]) was able to see him so quickly I do appreciate this greatly. With that being said he also did recommend an MRI for the patient to evaluate for any deeper signs of infection. That is actually scheduled for this coming Tuesday. He also has a follow-up appoint with Dr. Drucilla Schmidt on Tuesday. The last thing Dr. Drucilla Schmidt did mention was the possibility of more aggressive diuresis. The patient is on torsemide. With that being said this is managed by his cardiologist. I think we may want to get in touch with her clinic and see if possibly this is something that could be increased for a short amount of time depending on their recommendations to see if we can get some additional fluid off of him which in turn would likely help tremendously with his weeping and edema. He is in agreement with this plan if his cardiologist is in agreement. 11/01/2019 on evaluation today patient unfortunately notes that he did not get his MRI done which was  ordered by Dr. Drucilla Schmidt. Apparently when he went in it was 6:30 in the morning and they told him that he was  there to get an MRI of his toes. With that being said the MRI was ordered of his ankle and foot and so long story short the patient said that he was not getting MRI of his toes and left. Dr. Drucilla Schmidt was alerted and again it does not appear that at all that was what he ordered that he did order the ankle and foot but nonetheless that is beside the point at this time. The patient still has considerable edema noted at this time the nurse that came out last after just put a Kerlix and Coban wrap on the patient she did not even properly wrap him. Subsequently they have also discharged him being that they state he is not skilled at this point. Therefore he is going to have to be seen here in the office only. Fortunately there is no signs of systemic infection he still has a lot of erythema around the ankle and lower extremity region again there are any specific open wounds just general weeping and evidence of erythema which Dr. Drucilla Schmidt is questioning whether or not this is even infection or if it is just more secondary to the patient's lymphedema. He has had venous reflux studies which were negative for any abnormal findings unfortunately that is also not good to be an answer for [place] here. I have recommended the patient today contact his primary care provider to see if they can increase his fluid pills and I also think we need to increase his compression wrap to a 4-layer compression wrap to try to more effectively manage his edema. 11/08/2019 on evaluation today patient appears unfortunately to still be doing poorly. I did speak with his primary care provider office last week they were supposed to call him to advise what should be done as far as his fluid pills were concerned. With that being said they did not contact him as far as the patient tells me. I am not really sure exactly what is going on as  far as that is concerned. Nonetheless I really think that the patient needs to be admitted to the hospital for treatment likely as I explained to him he is going to require IV Lasix to get fluid off and to be honest also think he may need IV antibiotics. I have made all the outpatient referrals I can to try to get things under control he has been on oral antibiotics I really just have not seen any significant improvement he was even on linezolid. That was prescribed By infectious disease. Overall I am very concerned about the fact that this is not getting better. 11/22/2019 on evaluation today patient unfortunately is not doing very well with regard to his lower extremity edema. The left lower extremity is significantly swollen and still significantly erythematous. He has bright green drainage. He did just see Dr. Drucilla Schmidt on 11/19/2019 and Dr. Drucilla Schmidt did not feel like that this was infected. Subsequently he states that he feels like the issue is more 1 of not being at maximal diuresis and recommended the patient see his cardiologist to ensure that this was achieved. Subsequently Dr. Drucilla Schmidt states this is a issue with venous stasis dermatitis which again I completely understand that that may definitely be the case but unfortunately we've not been able to get things under control even with maximum compression at a 4 layer compression wrap. The patient is still having significant issues. Either way I do think the patient is getting need to follow-up with his cardiologist to see what  we can do Dr. Drucilla Schmidt did make mention that the patient had a flareup of infection that Keflex 500 mg 4 times a day or Augmentin twice a day would be good options for nonpurulent cellulitis. The patient was again supposed to have gone to the hospital on the weekend following Christmas after he got things settled with family as far as getting someone to take care of his wife. With that being said he tells me that when it  came around to that time he just didn't think about it. When asked him about why the drainage from his leg and the issues he is having that and remind him he told me "well it really hasn't changed". Objective Constitutional Well-nourished and well-hydrated in no acute distress. [name] Vitals Time Taken: 8:40 AM, Height: 69 in, Weight: 295 lbs, BMI: 43.6, Temperature: 98.3 F, Pulse: 81 bpm, Respiratory Rate: 18 breaths/min, Blood Pressure: 135/62 mmHg. Respiratory normal breathing without difficulty. Psychiatric this patient is able to make decisions and demonstrates good insight into disease process. Alert and Oriented x 3. pleasant and cooperative. General Notes: Upon evaluation today patient has significant erythema of the left lower extremity and again stasis dermatitis is what Dr. Drucilla Schmidt feels like this is as opposed any infection type process occurring. Nonetheless the patient does have significant weeping and we're having a very difficult time trying to keep this area dry even with a 4-layer compression wrap and super absorbent dressings on top of this. He really does not have any specific open wounds. Other Condition(s) Patient presents with Lymphedema located on the Bilateral Leg. 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 Post procedure Diagnosis Wound #: Same as Pre-Procedure Plan Wound Cleansing: Clean wound with Normal Saline. - in office Cleanse wound with mild soap and water - left leg Primary Wound Dressing: Silver Alginate - over weeping areas Secondary Dressing: [name], STROSNIDER  ([id number]) XtraSorb Dressing Change Frequency: Dressing is to be changed Monday and Thursday. - Nurse visit as needed Follow-up Appointments: Return Appointment in 1 week. Edema Control: 4-Layer Compression System - Left Lower Extremity. Other: - Unna to anchor wrap 1. My suggestion at this time is good to be that the patient continue with the 4 layer compression wrap along with the silver alginate dressings at really not sure there is much else that I can do to improve his overall lower extremity edema status. With that being said my suggestion is going to be that we go ahead and continue as such with this. 2. I am going also make referral back to the patient's cardiologist to see if the feel like he is at maximum diuresis or if there is anything they can do to help [place] out in this regard. Again I did mention to the patient the possibility of having IV diuretics in the hospital but again it doesn't seem like that something that he is getting what I do. 3. I did actually speak with the cardiology office and left a message with the triage nurse I could not speak with anyone as they were unfortunately busy. Nonetheless I did obtain the fax number from the receptionist and subsequently I'm also sending the notes from Dr. Drucilla Schmidt as well as the notes from here  in the clinic in order for them to review. We'll see what they have to say as far as try to help the patient out with diuresis. 4. In regard to infection Dr. Drucilla Schmidt really does not feel like there is anything infectious going on at this point. For that reason I am going to avoid any antibiotics at this point since again Dr. Drucilla Schmidt really doesn't feel like that skin to be of any benefit for the patient at the moment obviously I do not want patient on these antibiotics unnecessarily. We will see patient back for reevaluation in 1 week here in the clinic. If anything worsens or changes patient will contact our office for additional  recommendations. I explained to the patient that if he develops any fevers, chills, nausea, vomiting, or diarrhea that he is to go to the ER ASAP. Electronic Signature(s) Signed: 11/22/2019 9:20:29 AM By: Worthy Keeler PA-C Entered By: Worthy Keeler on 11/22/2019 09:20:29 [name], [name] -------------------------------------------------------------------------------- SuperBill Details Patient Name: [name]. Date of Service: 11/22/2019 Medical Record Number: [id number] Patient Account Number: [id number] Date of Birth/Sex: 12-24-1950 (68 y.o. 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 99991111 Essential (primary) hypertension I50.42 Chronic combined systolic (congestive) and diastolic (congestive) heart failure L02.416 Cutaneous abscess of left lower limb Facility Procedures CPT4 Code: [id number] Description: (Facility Use Only) [id number] - [facility] LWR LT LEG Modifier: Quantity: 1 Physician Procedures CPT4 Code: [id number] Description: 99214 - WC PHYS LEVEL 4 - EST PT ICD-10 Diagnosis Description I89.0 Lymphedema, not elsewhere classified I87.2 Venous insufficiency (chronic) (peripheral) E11.628 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

## 2019-11-22 NOTE — Telephone Encounter (Signed)
Wound care center is calling. Patient being treated for lower extremity edema. They are wrapping legs but patient is not making any progress. Jeri Cos, [place] is concerned that there is a high risk of infection, He has alerted PCP and PCP feels we need to treat this. Margarita Grizzle feels patient should go to hospital. He would like to speak with someone for advice concerning this. ASAP.

## 2019-11-23 NOTE — Telephone Encounter (Signed)
Spoke with discussed recommendations and coming into office next week. Patient verbalized understanding

## 2019-11-26 ENCOUNTER — Ambulatory Visit: Payer: Medicare Other | Admitting: Cardiology

## 2019-11-26 NOTE — Progress Notes (Deleted)
Primary Physician/Referring:  Aura Dials, MD  Patient ID: [name]., male    DOB: [birth year], 69 y.o.   MRN: [id number]  No chief complaint on file.  HPI:    HPI: [name].  is a 69 y.o. Caucasian male with hypertension, LBBB, uncontrolled diabetes mellitus, morbid obesity, chronic leg edema probably due to venous insufficiency from morbid obesity with recent ulceration left leg and now under [facility] care management and healing, was previously seen by Dr. Candee Furbish at [facility], now established with [place] for diastolic heart failure.  Patient reports that he is overall doing well.  He continues to have leg swelling that improves with leg elevation, but he is not compliant with doing this due to he states that it is uncomfortable.  He continues to have issues with left lower extremity wound that is not healing.  He has dressing in place today, similar checkups with wound care nurse and is coming to his home.  He states that he is having some drainage from his wound and he is noted to have some purulent drainage from his dressing today.  He has not had any fever.  He has dyspnea on exertion if he over exerts himself, he is limited in his activity due to his hip pain.  He does sleep in a recliner, due to his hip pain, not due to shortness of breath with lying flat.  Denies any chest pain.  Requesting a refill on torsemide.  He underwent echocardiogram and is here to discuss results.  He admits to being not compliant with diet modifications that were instructed at his last visit to help with his diastolic heart failure and also weight loss.    Past Medical History:  Diagnosis Date  . Arthritis   . Back pain    occasionally  . BPH (benign prostatic hypertrophy)    takes Uroxatral daily as well as Finasteride   . Diabetes mellitus without complication ([place])    takes Metformin daily  . Diabetic foot infection ([place]) 10/22/2019  . GERD (gastroesophageal reflux disease)    no  meds  . Hepatitis hx of   at age 69  . Hypertension    takes Losartan and HCTZ daily  . Joint pain   . Lymphedema 10/22/2019  . Urinary frequency   . Urinary urgency   . Venous stasis dermatitis 11/19/2019   Past Surgical History:  Procedure Laterality Date  . APPENDECTOMY     age 33  . COLONOSCOPY    . KNEE ARTHROSCOPY  1984   right and left   . SHOULDER ARTHROSCOPY  2003   right RCR-GSC-stayed overnight-had SOB  . SHOULDER ARTHROSCOPY WITH ROTATOR CUFF REPAIR AND SUBACROMIAL DECOMPRESSION Left 03/13/2013   Procedure: LEFT SHOULDER ARTHROSCOPY WITH SUBACROMIAL DECOMPRESSION, DISTAL CLAVICLE RESECTION AND REPAIR ROTATOR CUFF AND LABRAL DEBRIDEMENT;  Surgeon: [name]., MD;  Location: [place];  Service: Orthopedics;  Laterality: Left;  . TOTAL HIP ARTHROPLASTY Right 01/29/2015   Procedure: TOTAL HIP ARTHROPLASTY ANTERIOR APPROACH;  Surgeon: [name], MD;  Location: [place];  Service: Orthopedics;  Laterality: Right;  Marland Kitchen VASECTOMY    . [place]   Social History   Socioeconomic History  . Marital status: Married    Spouse name: Not on file  . Number of children: 3  . Years of education: Not on file  . Highest education level: Not on file  Occupational History  . Not on file  Tobacco Use  . Smoking status: Never Smoker  . Smokeless tobacco: Former Network engineer and Sexual Activity  . Alcohol use: Yes    Alcohol/week: 0.0 standard drinks    Comment: occasionally beer or scotch  . Drug use: No  . Sexual activity: Yes  Other Topics Concern  . Not on file  Social History Narrative  . Not on file   Social Determinants of Health   Financial Resource Strain:   . Difficulty of Paying Living Expenses: Not on file  Food Insecurity:   . Worried About Charity fundraiser in the Last Year: Not on file  . Ran Out of Food in the Last Year: Not on file  Transportation Needs:   . Lack of Transportation (Medical): Not on file  . Lack of  Transportation (Non-Medical): Not on file  Physical Activity:   . Days of Exercise per Week: Not on file  . Minutes of Exercise per Session: Not on file  Stress:   . Feeling of Stress : Not on file  Social Connections:   . Frequency of Communication with Friends and Family: Not on file  . Frequency of Social Gatherings with Friends and Family: Not on file  . Attends Religious Services: Not on file  . Active Member of Clubs or Organizations: Not on file  . Attends Archivist Meetings: Not on file  . Marital Status: Not on file  Intimate Partner Violence:   . Fear of Current or Ex-Partner: Not on file  . Emotionally Abused: Not on file  . Physically Abused: Not on file  . Sexually Abused: Not on file   ROS  Review of Systems  Constitution: Negative for chills, decreased appetite, malaise/fatigue, weight gain and weight loss.  Cardiovascular: Positive for dyspnea on exertion (chronic and stable) and leg swelling. Negative for orthopnea (sleeps in a recliner), paroxysmal nocturnal dyspnea and syncope.  Endocrine: Negative for cold intolerance.  Hematologic/Lymphatic: Does not bruise/bleed easily.  Musculoskeletal: Positive for arthritis (hands) and joint pain (right hip at total arthroplasty site). Negative for joint swelling.  Gastrointestinal: Positive for diarrhea (chronic). Negative for abdominal pain, anorexia, change in bowel habit, hematochezia and melena.  Neurological: Negative for headaches and light-headedness.  Psychiatric/Behavioral: Negative for depression and substance abuse.  All other systems reviewed and are negative.  Objective  There were no vitals taken for this visit. There is no height or weight on file to calculate BMI.   Physical Exam  Constitutional: He appears well-developed. No distress.  Morbidly obese  HENT:  Head: Atraumatic.  Eyes: Conjunctivae are normal.  Neck: No thyromegaly present.  Short neck and difficult to evaluate JVP    Cardiovascular: Normal rate, regular rhythm and normal heart sounds. Exam reveals no gallop.  No murmur heard. Pulses:      Carotid pulses are 2+ on the right side and 2+ on the left side.      Dorsalis pedis pulses are 2+ on the right side and 2+ on the left side.       Posterior tibial pulses are 2+ on the right side and 2+ on the left side.  Femoral pulse difficult to feel due to patient's body habitus. Bounding popliteal pulse bilateral.  Right pedal pulse normal in spite of edema. Left cannot be felt due to dressing from wound. Cannot make out JVD due to short neck.   Pulmonary/Chest: Effort normal and breath sounds normal.  Abdominal: Soft. Bowel sounds are normal.  Obese. Pannus present  Musculoskeletal:        General: Normal range of motion.     Cervical back: Neck supple.  Neurological: He is alert.  Skin: Skin is warm and dry.  Psychiatric: He has a normal mood and affect.  Vitals reviewed.  Radiology: No results found.  Laboratory examination:    Labs 05/23/2019: Serum glucose 180 mg, BUN 11, creatinine 0.95, EGFR greater than 60 mL, potassium 4.0.  A1c 9.8%.  CMP Latest Ref Rng & Units 10/22/2019 07/20/2018 06/26/2018  Glucose 65 - 99 mg/dL 145(H) 203(H) 173(H)  BUN 7 - 25 mg/dL 28(H) 25 21  Creatinine 0.70 - 1.18 mg/dL 1.39(H) 1.29(H) 1.10  Sodium 135 - 146 mmol/L 142 142 141  Potassium 3.5 - 5.3 mmol/L 4.2 4.5 4.2  Chloride 98 - 110 mmol/L 106 97 103  CO2 20 - 32 mmol/L _0 Calcium 8.6 - 10.3 mg/dL 7.9(L) 9.0 8.9  Total Protein 6.1 - 8.1 g/dL 6.8 - -  Total Bilirubin 0.2 - 1.2 mg/dL 0.5 - -  Alkaline Phos 39 - 117 U/L - - -  AST 10 - 35 U/L 26 - -  ALT 9 - 46 U/L 27 - -   CBC Latest Ref Rng & Units 10/22/2019 01/31/2015 01/30/2015  WBC 3.8 - 10.8 Thousand/uL 7.1 10.6(H) 9.9  Hemoglobin 13.2 - 17.1 g/dL 13.1(L) 10.5(L) 11.6(L)  Hematocrit 38.5 - 50.0 % 39.7 31.1(L) 34.3(L)  Platelets 140 - 400 Thousand/uL 203 161 204   Lipid Panel  No results found for:  CHOL, TRIG, HDL, CHOLHDL, VLDL, LDLCALC, LDLDIRECT HEMOGLOBIN A1C No results found for: HGBA1C, MPG TSH No results for input(s): TSH in the last 8760 hours. Medications   Current Outpatient Medications  Medication Instructions  . Cholecalciferol (VITAMIN D3) 5000 UNITS TABS 1 tablet, Oral, Daily, Reported on 04/29/2016  . doxycycline (VIBRAMYCIN) 100 mg, Oral, 2 times daily  . finasteride (PROSCAR) 5 mg, Oral, Daily  . GABAPENTIN PO Oral, As needed  . glipiZIDE (GLUCOTROL XL) 5 mg, Oral, 3 times daily with meals  . linezolid (ZYVOX) 600 mg, Oral, 2 times daily  . metoprolol succinate (TOPROL-XL) 50 MG 24 hr tablet TAKE 1 TABLET BY MOUTH  DAILY WITH OR IMMEDIATLEY  FOLLOWING A MEAL  . potassium chloride SA (KLOR-CON) 20 MEQ tablet Take 1 tablet by mouth daily  . torsemide (DEMADEX) 40 mg, Oral, Daily    Cardiac Studies:   Lexiscan Sestamibi Stress Test 06/13/2017 at [facility]:  The left ventricular ejection fraction is moderately decreased (30-44%).  Nuclear stress EF: 39%.  There was no ST segment deviation noted during stress.  Defect 1: There is a small defect of mild severity present in the mid anteroseptal and apical septal location.   Low risk stress nuclear study with LBBB-related septal perfusion artifact and moderately reduced left ventricular global systolic function.  Echocardiogram 08/15/2019: Left ventricle cavity is normal in size. Moderate concentric hypertrophy of the left ventricle. Mildly depressed LV systolic function with EF 50%. Normal global wall motion. Indeterminate diastolic filling pattern. Calculated EF 50%. Left atrial cavity is moderately dilated. No significant valvular abnormality.  Normal right atrial pressure.   Assessment   No diagnosis found.  EKG 09/12/2019: Normal sinus rhythm at rate of 90 bpm, left atrial abnormality, left bundle branch block.  No further analysis.   Recommendations:   I discussed echocardiogram results with the patient  that are essentially unchanged compared to his previous echocardiogram.  I suspect is multifactorial.  He was again counseled  on the importance of diet modifications to help with his diastolic heart failure as well as weight loss.  He has not had any worsening shortness of breath.  Instructed him to try leg elevation to help with his leg edema.  I am concerned regarding his left lower extremity ulceration that is nonhealing and he clearly has purulent drainage from his dressing today.  I will make a referral to wound care center for further evaluation and treatment.  I am unable to further evaluate this today in view of dressing that is in place.  He will also need vascular evaluation, for possible etiologies of nonhealing ulceration.  He is also a diabetic which is likely contributing.  I will obtain ABI for evaluation.  We will have this performed at vein and vascular given his ulceration and needing dressing to be placed after the study.  I would like to see him back in approximately 3 weeks for close monitoring and to discuss ABI results.   *I have discussed this case with Dr. Einar Gip and participated in formulating the plan.*   Miquel Dunn, MSN, APRN, FNP-C [facility]. [place] Office: [phone] Fax: [phone]

## 2019-11-27 ENCOUNTER — Encounter: Payer: Self-pay | Admitting: Cardiology

## 2019-11-27 ENCOUNTER — Other Ambulatory Visit: Payer: Self-pay

## 2019-11-27 ENCOUNTER — Ambulatory Visit: Payer: Medicare Other | Admitting: Cardiology

## 2019-11-27 VITALS — BP 115/56 | HR 82 | Temp 97.9°F | Ht 69.0 in | Wt 311.0 lb

## 2019-11-27 DIAGNOSIS — L97929 Non-pressure chronic ulcer of unspecified part of left lower leg with unspecified severity: Secondary | ICD-10-CM | POA: Diagnosis not present

## 2019-11-27 DIAGNOSIS — I5032 Chronic diastolic (congestive) heart failure: Secondary | ICD-10-CM

## 2019-11-27 DIAGNOSIS — R600 Localized edema: Secondary | ICD-10-CM

## 2019-11-27 MED ORDER — METOLAZONE 5 MG PO TABS: 5.0000 mg | ORAL_TABLET | Freq: Every day | ORAL | 0 refills | Status: DC

## 2019-11-27 NOTE — Progress Notes (Signed)
Primary Physician/Referring:  Aura Dials, MD  Patient ID: Keith Reading., male    DOB: [birth year], 69 y.o.   MRN: [id number]  Chief Complaint  Patient presents with  . Edema  . Follow-up   HPI:    HPI: [name].  is a 69 y.o. Caucasian male with hypertension, LBBB, uncontrolled diabetes mellitus, morbid obesity, chronic leg edema probably due to venous insufficiency from morbid obesity with recent ulceration left leg and is now under management of wound care therapy. He has had little progression since starting treatment.  He has had negative venous insufficiency evaluation and ABI with vascular surgery. Ulceration is felt to be related to venous stasis dermatitis. I was contacted by Keith Cos, PA at wound care center last week due to concern of poor responsiveness to treatment due to continued swelling. I had increased his torsemide to BID dosing and I had requested to see him in the office for further management.   Patient states that he is doing well.  He continues to have left lower extremity dressing in place, that he feels is improving as he is no longer having drainage.  He does have some mild pain to his left lower extremity.  He is to see wound care on Thursday.  He has diuresed some since being on increased dose of torsemide; however, his weight is still elevated.  He denies any chest pain or dyspnea on exertion; however, does not do much activity.  He is the sole caregiver for his wife who is blind.  He does sleep in a recliner, due to his hip pain, not due to shortness of breath with lying flat.  States that he is compliant with his diet.  Eats mostly protein.  Uses salt substitute.  Does admit to drinking occasional sodas or sweet tea during the day.    Past Medical History:  Diagnosis Date  . Arthritis   . Back pain    occasionally  . BPH (benign prostatic hypertrophy)    takes Uroxatral daily as well as Finasteride   . Diabetes mellitus without  complication ([place])    takes Metformin daily  . Diabetic foot infection ([place]) 10/22/2019  . GERD (gastroesophageal reflux disease)    no meds  . Hepatitis hx of   at age 75  . Hypertension    takes Losartan and HCTZ daily  . Joint pain   . Lymphedema 10/22/2019  . Urinary frequency   . Urinary urgency   . Venous stasis dermatitis 11/19/2019   Past Surgical History:  Procedure Laterality Date  . APPENDECTOMY     age 43  . COLONOSCOPY    . KNEE ARTHROSCOPY  1984   right and left   . SHOULDER ARTHROSCOPY  2003   right RCR-GSC-stayed overnight-had SOB  . SHOULDER ARTHROSCOPY WITH ROTATOR CUFF REPAIR AND SUBACROMIAL DECOMPRESSION Left 03/13/2013   Procedure: LEFT SHOULDER ARTHROSCOPY WITH SUBACROMIAL DECOMPRESSION, DISTAL CLAVICLE RESECTION AND REPAIR ROTATOR CUFF AND LABRAL DEBRIDEMENT;  Surgeon: [name]., MD;  Location: [facility];  Service: Orthopedics;  Laterality: Left;  . TOTAL HIP ARTHROPLASTY Right 01/29/2015   Procedure: TOTAL HIP ARTHROPLASTY ANTERIOR APPROACH;  Surgeon: [name], MD;  Location: [facility];  Service: Orthopedics;  Laterality: Right;  Marland Kitchen VASECTOMY    . [place]   Social History   Socioeconomic History  . Marital status: Married    Spouse name: Not on file  . Number of children: 3  .  Years of education: Not on file  . Highest education level: Not on file  Occupational History  . Not on file  Tobacco Use  . Smoking status: Never Smoker  . Smokeless tobacco: Former Network engineer and Sexual Activity  . Alcohol use: Yes    Alcohol/week: 0.0 standard drinks    Comment: occasionally beer or scotch  . Drug use: No  . Sexual activity: Yes  Other Topics Concern  . Not on file  Social History Narrative  . Not on file   Social Determinants of Health   Financial Resource Strain:   . Difficulty of Paying Living Expenses: Not on file  Food Insecurity:   . Worried About Charity fundraiser in the Last Year: Not on  file  . Ran Out of Food in the Last Year: Not on file  Transportation Needs:   . Lack of Transportation (Medical): Not on file  . Lack of Transportation (Non-Medical): Not on file  Physical Activity:   . Days of Exercise per Week: Not on file  . Minutes of Exercise per Session: Not on file  Stress:   . Feeling of Stress : Not on file  Social Connections:   . Frequency of Communication with Friends and Family: Not on file  . Frequency of Social Gatherings with Friends and Family: Not on file  . Attends Religious Services: Not on file  . Active Member of Clubs or Organizations: Not on file  . Attends Archivist Meetings: Not on file  . Marital Status: Not on file  Intimate Partner Violence:   . Fear of Current or Ex-Partner: Not on file  . Emotionally Abused: Not on file  . Physically Abused: Not on file  . Sexually Abused: Not on file   ROS  Review of Systems  Constitution: Negative for chills, decreased appetite, malaise/fatigue, weight gain and weight loss.  Cardiovascular: Positive for dyspnea on exertion (chronic and stable) and leg swelling. Negative for orthopnea (sleeps in a recliner), paroxysmal nocturnal dyspnea and syncope.  Endocrine: Negative for cold intolerance.  Hematologic/Lymphatic: Does not bruise/bleed easily.  Musculoskeletal: Positive for arthritis (hands) and joint pain (right hip at total arthroplasty site). Negative for joint swelling.  Gastrointestinal: Positive for diarrhea (chronic). Negative for abdominal pain, anorexia, change in bowel habit, hematochezia and melena.  Neurological: Negative for headaches and light-headedness.  Psychiatric/Behavioral: Negative for depression and substance abuse.  All other systems reviewed and are negative.  Objective  Blood pressure (!) 115/56, pulse 82, temperature 97.9 F (36.6 C), height '5\' 9"'$  (1.753 m), weight (!) 311 lb (141.1 kg), SpO2 97 %. Body mass index is 45.93 kg/m.   Physical Exam    Constitutional: He appears well-developed. No distress.  Morbidly obese  HENT:  Head: Atraumatic.  Eyes: Conjunctivae are normal.  Neck: No thyromegaly present.  Short neck and difficult to evaluate JVP  Cardiovascular: Normal rate, regular rhythm and normal heart sounds. Exam reveals no gallop.  No murmur heard. Pulses:      Carotid pulses are 2+ on the right side and 2+ on the left side.      Dorsalis pedis pulses are 2+ on the right side and 2+ on the left side.       Posterior tibial pulses are 2+ on the right side and 2+ on the left side.  Femoral pulse difficult to feel due to patient's body habitus. Bounding popliteal pulse bilateral.  Right pedal pulse normal in spite of edema. Left cannot be  felt due to dressing from wound. Cannot make out JVD due to short neck.   Pulmonary/Chest: Effort normal and breath sounds normal.  Abdominal: Soft. Bowel sounds are normal.  Obese. Pannus present  Musculoskeletal:        General: Normal range of motion.     Cervical back: Neck supple.  Neurological: He is alert.  Skin: Skin is warm and dry.  Psychiatric: He has a normal mood and affect.  Vitals reviewed.  Radiology: No results found.  Laboratory examination:    Labs 05/23/2019: Serum glucose 180 mg, BUN 11, creatinine 0.95, EGFR greater than 60 mL, potassium 4.0.  A1c 9.8%.  CMP Latest Ref Rng & Units 10/22/2019 07/20/2018 06/26/2018  Glucose 65 - 99 mg/dL 145(H) 203(H) 173(H)  BUN 7 - 25 mg/dL 28(H) 25 21  Creatinine 0.70 - 1.18 mg/dL 1.39(H) 1.29(H) 1.10  Sodium 135 - 146 mmol/L 142 142 141  Potassium 3.5 - 5.3 mmol/L 4.2 4.5 4.2  Chloride 98 - 110 mmol/L 106 97 103  CO2 20 - 32 mmol/L '28 28 23  '$ Calcium 8.6 - 10.3 mg/dL 7.9(L) 9.0 8.9  Total Protein 6.1 - 8.1 g/dL 6.8 - -  Total Bilirubin 0.2 - 1.2 mg/dL 0.5 - -  Alkaline Phos 39 - 117 U/L - - -  AST 10 - 35 U/L 26 - -  ALT 9 - 46 U/L 27 - -   CBC Latest Ref Rng & Units 10/22/2019 01/31/2015 01/30/2015  WBC 3.8 - 10.8  Thousand/uL 7.1 10.6(H) 9.9  Hemoglobin 13.2 - 17.1 g/dL 13.1(L) 10.5(L) 11.6(L)  Hematocrit 38.5 - 50.0 % 39.7 31.1(L) 34.3(L)  Platelets 140 - 400 Thousand/uL 203 161 204   Lipid Panel  No results found for: CHOL, TRIG, HDL, CHOLHDL, VLDL, LDLCALC, LDLDIRECT HEMOGLOBIN A1C No results found for: HGBA1C, MPG TSH No results for input(s): TSH in the last 8760 hours. Medications   Current Outpatient Medications  Medication Instructions  . Cholecalciferol (VITAMIN D3) 5000 UNITS TABS 1 tablet, Oral, Daily, Reported on 04/29/2016  . finasteride (PROSCAR) 5 mg, Oral, Daily  . GABAPENTIN PO Oral, As needed  . glipiZIDE (GLUCOTROL XL) 5 mg, Oral, 3 times daily with meals, 2am, 1 pm  . metolazone (ZAROXOLYN) 5 mg, Oral, Daily  . metoprolol succinate (TOPROL-XL) 50 MG 24 hr tablet TAKE 1 TABLET BY MOUTH  DAILY WITH OR IMMEDIATLEY  FOLLOWING A MEAL  . potassium chloride SA (KLOR-CON) 20 MEQ tablet Take 1 tablet by mouth daily  . torsemide (DEMADEX) 40 mg, Oral, Daily    Cardiac Studies:   Lexiscan Sestamibi Stress Test 06/13/2017 at [facility]:  The left ventricular ejection fraction is moderately decreased (30-44%).  Nuclear stress EF: 39%.  There was no ST segment deviation noted during stress.  Defect 1: There is a small defect of mild severity present in the mid anteroseptal and apical septal location.   Low risk stress nuclear study with LBBB-related septal perfusion artifact and moderately reduced left ventricular global systolic function.  Echocardiogram 08/15/2019: Left ventricle cavity is normal in size. Moderate concentric hypertrophy of the left ventricle. Mildly depressed LV systolic function with EF 50%. Normal global wall motion. Indeterminate diastolic filling pattern. Calculated EF 50%. Left atrial cavity is moderately dilated. No significant valvular abnormality.  Normal right atrial pressure.   Assessment     ICD-10-CM   1. Lower extremity ulceration, left, with  unspecified severity ([place])  L97.929   2. Chronic diastolic heart failure (HCC)  I50.32   3. Lower  extremity edema  O60.0 Basic metabolic panel    B Nat Peptide    Basic metabolic panel    EKG 29/84/7308: Normal sinus rhythm at rate of 90 bpm, left atrial abnormality, left bundle branch block.  No further analysis.   Recommendations:   I suspect his lower extremity edema is related to venous stasis from sitting in his chair from long periods of time, poor dietary compliance, and obesity.  He has been evaluated by vascular surgery, no significant venous insufficiency.  ABI was essentially normal.  He has also been evaluated by infectious disease who also felt that his left leg ulceration was related to venous stasis dermatitis.  LVEF is only mildly depressed at 50%.  I suspect his diastolic dysfunction is related to his obesity.  I will aggressively diurese.  He has not had significant response with increased dose of torsemide.  We will add metolazone 5 mg daily for the next 5 days.  Ideally I would have the patient to be on essentially bedrest with his legs elevated for the next 1 week; however, he states that this is not possible as he is not comfortable in the bed.  He will try to elevate his legs further in the recliner.  I have advised him to mostly stay sitting in his chair with legs elevated and to avoid being on his feet for long periods of time.  This is difficult as he is the sole caregiver for his wife who is blind, in which I have asked him to return to neighbors to help him care for her during the next 1 week.  We have extensively discussed dietary changes to also help with controlling his leg edema and help with weight loss.  I will check his BMP and BNP in 1 week before his next office visit.    Miquel Dunn, MSN, APRN, FNP-C [facility]. [place] Office: [phone] Fax: [phone]

## 2019-11-29 ENCOUNTER — Other Ambulatory Visit: Payer: Self-pay

## 2019-11-29 ENCOUNTER — Encounter: Payer: Medicare Other | Admitting: Physician Assistant

## 2019-11-29 DIAGNOSIS — I890 Lymphedema, not elsewhere classified: Secondary | ICD-10-CM | POA: Diagnosis not present

## 2019-11-29 NOTE — Progress Notes (Signed)
Keith, [name] ([id number]) Visit Report for 11/29/2019 Arrival Information Details Patient Name: [name], [name]. Date of Service: 11/29/2019 8:30 AM Medical Record Number: [id number] Patient Account Number: [id number] Date of Birth/Sex: 1951/06/06 (69 y.o. M) Treating RN: Army Melia Primary Care Lilah Mijangos: Aura Dials Other Clinician: Referring Lorin Hauck: Aura Dials Treating Rayah Fines/Extender: Melburn Hake, HOYT Weeks in Treatment: 9 Visit Information History Since Last Visit Added or deleted any medications: No Patient Arrived: Ambulatory Any new allergies or adverse reactions: No Arrival Time: 08:36 Had a fall or experienced change in No Accompanied By: self activities of daily living that may affect Transfer Assistance: None risk of falls: Patient Identification Verified: Yes Signs or symptoms of abuse/neglect since last visito No Secondary Verification Process Yes Hospitalized since last visit: No Completed: Implantable device outside of the clinic excluding No Patient Requires Transmission-Based No cellular tissue based products placed in the center Precautions: since last visit: Patient Has Alerts: Yes Has Dressing in Place as Prescribed: Yes Patient Alerts: DM II Has Compression in Place as Prescribed: Yes ABI11/18/20 L 1.25 R Pain Present Now: No 1.19 Electronic Signature(s) Signed: 11/29/2019 12:26:10 PM By: Lorine Bears RCP, RRT, CHT Entered By: Becky Sax, Amado Nash on 11/29/2019 08:37:38 Saiki, [name] ([id number]) -------------------------------------------------------------------------------- Compression Therapy Details Patient Name: [name]. Date of Service: 11/29/2019 8:30 AM Medical Record Number: [id number] Patient Account Number: [id number] Date of Birth/Sex: [birth year] (69 y.o. M) Treating RN: Army Melia Primary Care Nickalus Thornsberry: Aura Dials Other Clinician: Referring Naseem Adler: Aura Dials Treating  Alyson Ki/Extender: Melburn Hake, HOYT Weeks in Treatment: 9 Compression Therapy Performed for Wound Assessment: NonWound Condition Lymphedema - Bilateral Leg Performed By: Clinician Army Melia, RN Compression Type: Four Layer Post Procedure Diagnosis Same as Pre-procedure Electronic Signature(s) Signed: 11/29/2019 2:34:43 PM By: Army Melia Entered By: Army Melia on 11/29/2019 08:59:22 Schnepf, [name] ([id number]) -------------------------------------------------------------------------------- Encounter Discharge Information Details Patient Name: [name]. Date of Service: 11/29/2019 8:30 AM Medical Record Number: [id number] Patient Account Number: [id number] Date of Birth/Sex: 1951/08/08 (69 y.o. M) Treating RN: Army Melia Primary Care Quinntin Malter: Aura Dials Other Clinician: Referring Sian Joles: Aura Dials Treating Levent Kornegay/Extender: Melburn Hake, HOYT Weeks in Treatment: 9 Encounter Discharge Information Items Discharge Condition: Stable Ambulatory Status: Ambulatory Discharge Destination: Home Transportation: Private Auto Accompanied By: self Schedule Follow-up Appointment: Yes Clinical Summary of Care: Electronic Signature(s) Signed: 11/29/2019 2:34:43 PM By: Army Melia Entered By: Army Melia on 11/29/2019 09:02:59 Fridman, [name] ([id number]) -------------------------------------------------------------------------------- Lower Extremity Assessment Details Patient Name: [name]. Date of Service: 11/29/2019 8:30 AM Medical Record Number: [id number] Patient Account Number: [id number] Date of Birth/Sex: 05-May-1951 (69 y.o. M) Treating RN: Montey Hora Primary Care Monique Gift: Aura Dials Other Clinician: Referring Keryl Gholson: Aura Dials Treating Rorey Bisson/Extender: STONE III, HOYT Weeks in Treatment: 9 Edema Assessment Assessed: [Left: No] [Right: No] Edema: [Left: Ye] [Right: s] Calf Left: Right: Point of Measurement: 36 cm From Medial  Instep 50 cm cm Ankle Left: Right: Point of Measurement: 12 cm From Medial Instep 30.5 cm cm Vascular Assessment Pulses: Dorsalis Pedis Palpable: [Left:Yes] Electronic Signature(s) Signed: 11/29/2019 2:52:01 PM By: Montey Hora Entered By: Montey Hora on 11/29/2019 08:44:34 Salemi, [name] ([id number]) -------------------------------------------------------------------------------- Multi Wound Chart Details Patient Name: [name]. Date of Service: 11/29/2019 8:30 AM Medical Record Number: [id number] Patient Account Number: [id number] Date of Birth/Sex: 07-29-1951 (69 y.o. M) Treating RN: Army Melia Primary Care Sarahelizabeth Conway: Aura Dials Other Clinician: Referring Navy Belay: Aura Dials Treating Armelia Penton/Extender: STONE III, HOYT Weeks in Treatment: 9 Vital Signs Height(in): 28  Pulse(bpm): 87 Weight(lbs): 295 Blood Pressure(mmHg): 142/69 Body Mass Index(BMI): 44 Temperature(F): 98.4 Respiratory Rate 18 (breaths/min): Wound Assessments Treatment Notes Electronic Signature(s) Signed: 11/29/2019 2:34:43 PM By: Army Melia Entered By: Army Melia on 11/29/2019 08:53:51 Mahmood, [name] ([id number]) -------------------------------------------------------------------------------- Multi-Disciplinary Care Plan Details Patient Name: [name], [name]. Date of Service: 11/29/2019 8:30 AM Medical Record Number: [id number] Patient Account Number: [id number] Date of Birth/Sex: 04/06/1951 (69 y.o. M) Treating RN: Army Melia Primary Care Yilia Sacca: Aura Dials Other Clinician: Referring Sheenah Dimitroff: Aura Dials Treating Amarya Kuehl/Extender: Melburn Hake, HOYT Weeks in Treatment: 9 Active Inactive Nutrition Nursing Diagnoses: Impaired glucose control: actual or potential Goals: Patient/caregiver verbalizes understanding of need to maintain therapeutic glucose control per primary care physician Date Initiated: 09/27/2019 Target Resolution Date: 10/19/2019 Goal  Status: Active Interventions: Assess HgA1c results as ordered upon admission and as needed Notes: Orientation to the Wound Care Program Nursing Diagnoses: Knowledge deficit related to the wound healing center program Goals: Patient/caregiver will verbalize understanding of the [place] Date Initiated: 09/27/2019 Target Resolution Date: 10/19/2019 Goal Status: Active Interventions: Provide education on orientation to the wound center Notes: Wound/Skin Impairment Nursing Diagnoses: Impaired tissue integrity Goals: Ulcer/skin breakdown will have a volume reduction of 30% by week 4 Date Initiated: 09/27/2019 Target Resolution Date: 10/25/2019 Goal Status: Active Interventions: Assess ulceration(s) every visit [name], [name] Notes: Electronic Signature(s) Signed: 11/29/2019 2:34:43 PM By: Army Melia Entered By: Army Melia on 11/29/2019 08:53:41 Steelman, [name] ([id number]) -------------------------------------------------------------------------------- Non-Wound Condition Assessment Details Patient Name: [name]. Date of Service: 11/29/2019 8:30 AM Medical Record Number: [id number] Patient Account Number: [id number] Date of Birth/Sex: 25-Sep-1951 (69 y.o. M) Treating RN: Montey Hora Primary Care Lenea [place]water: Aura Dials Other Clinician: Referring Delron Comer: Aura Dials Treating Alric Geise/Extender: STONE III, HOYT Weeks in Treatment: 9 Non-Wound Condition: Condition: Lymphedema Location: Leg Side: Bilateral Photos Electronic Signature(s) Signed: 11/29/2019 2:52:01 PM By: Montey Hora Entered By: Montey Hora on 11/29/2019 08:44:56 Godek, [name] ([id number]) -------------------------------------------------------------------------------- Pain Assessment Details Patient Name: [name]. Date of Service: 11/29/2019 8:30 AM Medical Record Number: [id number] Patient Account Number: [id number] Date of Birth/Sex:  1951/03/16 (69 y.o. M) Treating RN: Army Melia Primary Care Bassam Dresch: Aura Dials Other Clinician: Referring Teodoro Jeffreys: Aura Dials Treating Miosha Behe/Extender: Melburn Hake, HOYT Weeks in Treatment: 9 Active Problems Location of Pain Severity and Description of Pain Patient Has Paino No Site Locations Pain Management and Medication Current Pain Management: Electronic Signature(s) Signed: 11/29/2019 12:26:10 PM By: Lorine Bears RCP, RRT, CHT Signed: 11/29/2019 2:34:43 PM By: Army Melia Entered By: Lorine Bears on 11/29/2019 08:37:55 Gade, [name] ([id number]) -------------------------------------------------------------------------------- Patient/Caregiver Education Details Patient Name: [name], [name]. Date of Service: 11/29/2019 8:30 AM Medical Record Number: [id number] Patient Account Number: [id number] Date of Birth/Gender: [birth year] (69 y.o. M) Treating RN: Army Melia Primary Care Physician: Aura Dials Other Clinician: Referring Physician: Aura Dials Treating Physician/Extender: Sharalyn Ink in Treatment: 9 Education Assessment Education Provided To: Patient Education Topics Provided Wound/Skin Impairment: Handouts: Caring for Your Ulcer Methods: Demonstration, Explain/Verbal Responses: State content correctly Electronic Signature(s) Signed: 11/29/2019 2:34:43 PM By: Army Melia Entered By: Army Melia on 11/29/2019 09:01:49 Weider, [name] ([id number]) -------------------------------------------------------------------------------- Vitals Details Patient Name: [name]. Date of Service: 11/29/2019 8:30 AM Medical Record Number: [id number] Patient Account Number: [id number] Date of Birth/Sex: May 26, 1951 (69 y.o. M) Treating RN: Army Melia Primary Care Shadell Brenn: Aura Dials Other Clinician: Referring Shylynn Bruning: Aura Dials Treating Oluwatobiloba Martin/Extender: STONE III, HOYT Weeks in Treatment:  9 Vital Signs  Time Taken: 08:38 Temperature (F): 98.4 Height (in): 69 Pulse (bpm): 87 Weight (lbs): 295 Respiratory Rate (breaths/min): 18 Body Mass Index (BMI): 43.6 Blood Pressure (mmHg): 142/69 Reference Range: 80 - 120 mg / dl Electronic Signature(s) Signed: 11/29/2019 12:26:10 PM By: Lorine Bears RCP, RRT, CHT Entered By: Lorine Bears on 11/29/2019 08:40:45

## 2019-11-29 NOTE — Progress Notes (Addendum)
SEVRIN, GRAHN ([id number]) Visit Report for 11/29/2019 Chief Complaint Document Details Patient Name: [name], [name]. Date of Service: 11/29/2019 8:30 AM Medical Record Number: [id number] Patient Account Number: [id number] Date of Birth/Sex: 1951-01-26 (69 y.o. M) Treating RN: Army Melia Primary Care Provider: Aura Dials Other Clinician: Referring Provider: Aura Dials Treating Provider/Extender: Melburn Hake, Jordani Nunn Weeks in Treatment: 9 Information Obtained from: Patient Chief Complaint Bilateral LE lymphedema left leg cellulitis Electronic Signature(s) Signed: 11/29/2019 8:51:21 AM By: Worthy Keeler PA-C Entered By: Worthy Keeler on 11/29/2019 08:51:21 [name] -------------------------------------------------------------------------------- HPI Details Patient Name: [name]. Date of Service: 11/29/2019 8:30 AM Medical Record Number: [id number] Patient Account Number: [id number] Date of Birth/Sex: 12-18-1950 (69 y.o. M) Treating RN: Army Melia Primary Care Provider: Aura Dials Other Clinician: Referring Provider: Aura Dials Treating Provider/Extender: Melburn Hake, Katalea Ucci Weeks in Treatment: 9 History of Present Illness HPI Description: 09/27/2019 on evaluation today patient appears to be doing poorly in regard to his bilateral lower extremities he does have bilateral lower extremity lymphedema. With that being said he also has a history of diabetes type 2, hypertension, congestive heart failure, and apparently has chronic venous insufficiency which has led to the lymphedema. Currently he also shows signs of an infection of the left lower extremity which does have me concerned at this point as well. He tells me that he is not really having any significant pain but has had a lot of trouble with his legs in general. He does have a compression stocking he is wearing on the right lower extremity he was wrapped with an Unna boot on the left  lower extremity. The patient tells me that overall he has been doing with this for several weeks and home health has been initiated. He has not been on any antibiotics yet and he tells me that the home health nurse was not sure that it was infected based on what I see today I am concerned that this likely is infected at this point. Fortunately however there does not appear to be any evidence of systemic infection which is good news. The Unna boot does seem to be helping control the edema although he may benefit from a stronger compression wrap I think that at this point without having the ABIs completed the Unna boot is where I would stand. He is supposed to be having an arterial study with ABI as ordered by his cardiologist but he states he may have missed that appointment and has not called to reschedule he needs to contact them to get this rescheduled as soon as possible I explained to him. He tells me that he is draining a lot as far as the left lower extremity is concerned and that is something that he feels like is not lasting very long as far as the wraps are concerned when they put him on they seem to get wet extremely quickly. 10/04/2019 on evaluation today patient appears to be doing better compared to last week's evaluation. Fortunately there is no signs of active infection at this time. He has been tolerating the dressing changes without complication. He did undergo arterial studies yesterday and has an appointment tomorrow with the vascular specialist. With that being said his ABI on the left was 1.25 on the right 1.19 with a TBI of 0.61 on the left and 0.69 on the right. There was stated to be no evidence of obvious significant arterial disease and the patient had pretty much triphasic blood flow throughout. Overall  I am very pleased with how things seem to be progressing. We will see if the vascular doctor says anything different tomorrow but I even at this point I feel like he would  do well with a stronger compression wrap to try to get some the fluid out of his leg at this point. The good news is his drainage has slowed down considerably I do believe the antibiotics have been beneficial for him. 10/18/2019 on evaluation today patient appears to be doing poorly in regard to his left lower extremity. He still having a lot of drainage and weeping from his leg this appears to be much more erythematous and in fact I am concerned that the erythema may be spreading up his leg as well. He has not experienced any fevers, chills, nausea, vomiting, or diarrhea up to this point. I explained to him that I really feel like based on what I am seeing he may need to potentially go to the hospital for evaluation and treatment potentially even with IV antibiotics depending on what they see. With that being said he tells me that he is not really able to do that due to the fact that he is the primary caregiver for his wife who is legally blind. He states he is not good to be able to have anything to help as far as caring for her if he were to go into the hospital. Nonetheless he is unsure what to do in this regard. I really think that he needs faster treatment. For that reason I am going to likely try to get in touch with the infectious disease office to see if I can get him in sooner than [place]. 10/25/2019 on evaluation today patient appears to be doing a little better in regard to his weeping and edema compared to the last time I saw him. The dorsal surface of his foot is a little bit drier compared to what was this is good news. He did see Dr. Drucilla Schmidt on 10/22/2019. Dr. Drucilla Schmidt did feel that potentially this could be secondary to some cellulitis and he recommended subsequently placing the patient on Zyvox which the patient feels like has helped as well. There does not appear to be any signs of active infection at this time which is good news systemically. I am very pleased that Dr. Drucilla Schmidt was  able to see him so quickly I do appreciate this greatly. With that being said he also did recommend an MRI for the patient to evaluate for any deeper signs of infection. That is actually scheduled for this coming Tuesday. He also has a follow-up appoint with Dr. Drucilla Schmidt on Tuesday. The last thing Dr. Drucilla Schmidt did mention was the possibility of more aggressive diuresis. The patient is on torsemide. With that being said this is managed by his cardiologist. I think we may want to get in touch with her clinic and see if possibly this is something that could be increased for a short amount of time depending on their recommendations to see if we can get some additional fluid off of him which in turn would likely help tremendously with his KOHEI, BOORTZ. ([id number]) weeping and edema. He is in agreement with this plan if his cardiologist is in agreement. 11/01/2019 on evaluation today patient unfortunately notes that he did not get his MRI done which was ordered by Dr. Drucilla Schmidt. Apparently when he went in it was 6:30 in the morning and they told him that he was there to get an MRI of  his toes. With that being said the MRI was ordered of his ankle and foot and so long story short the patient said that he was not getting MRI of his toes and left. Dr. Drucilla Schmidt was alerted and again it does not appear that at all that was what he ordered that he did order the ankle and foot but nonetheless that is beside the point at this time. The patient still has considerable edema noted at this time the nurse that came out last after just put a Kerlix and Coban wrap on the patient she did not even properly wrap him. Subsequently they have also discharged him being that they state he is not skilled at this point. Therefore he is going to have to be seen here in the office only. Fortunately there is no signs of systemic infection he still has a lot of erythema around the ankle and lower extremity region again there are any  specific open wounds just general weeping and evidence of erythema which Dr. Drucilla Schmidt is questioning whether or not this is even infection or if it is just more secondary to the patient's lymphedema. He has had venous reflux studies which were negative for any abnormal findings unfortunately that is also not good to be an answer for [place] here. I have recommended the patient today contact his primary care provider to see if they can increase his fluid pills and I also think we need to increase his compression wrap to a 4-layer compression wrap to try to more effectively manage his edema. 11/08/2019 on evaluation today patient appears unfortunately to still be doing poorly. I did speak with his primary care provider office last week they were supposed to call him to advise what should be done as far as his fluid pills were concerned. With that being said they did not contact him as far as the patient tells me. I am not really sure exactly what is going on as far as that is concerned. Nonetheless I really think that the patient needs to be admitted to the hospital for treatment likely as I explained to him he is going to require IV Lasix to get fluid off and to be honest also think he may need IV antibiotics. I have made all the outpatient referrals I can to try to get things under control he has been on oral antibiotics I really just have not seen any significant improvement he was even on linezolid. That was prescribed By infectious disease. Overall I am very concerned about the fact that this is not getting better. 11/22/2019 on evaluation today patient unfortunately is not doing very well with regard to his lower extremity edema. The left lower extremity is significantly swollen and still significantly erythematous. He has bright green drainage. He did just see Dr. Drucilla Schmidt on 11/19/2019 and Dr. Drucilla Schmidt did not feel like that this was infected. Subsequently he states that he feels like the issue is more 1 of  not being at maximal diuresis and recommended the patient see his cardiologist to ensure that this was achieved. Subsequently Dr. Drucilla Schmidt states this is a issue with venous stasis dermatitis which again I completely understand that that may definitely be the case but unfortunately we've not been able to get things under control even with maximum compression at a 4 layer compression wrap. The patient is still having significant issues. Either way I do think the patient is getting need to follow-up with his cardiologist to see what we can do Dr. Drucilla Schmidt  did make mention that the patient had a flareup of infection that Keflex 500 mg 4 times a day or Augmentin twice a day would be good options for nonpurulent cellulitis. The patient was again supposed to have gone to the hospital on the weekend following Christmas after he got things settled with family as far as getting someone to take care of his wife. With that being said he tells me that when it came around to that time he just didn't think about it. When asked him about why the drainage from his leg and the issues he is having that and remind him he told me "well it really hasn't changed". 11/29/2019 on evaluation today patient presents for follow-up concerning his left lower extremity edema and weeping. He did see his cardiologist at my request after I discussed with him the situation as well. This is Binnie Kand who is a family Designer, jewellery that he saw. Subsequently the patient was recommended to start metolazone 5 mg daily for the next 5 days. They also recommended that really he needed essentially bed rest with elevation of his legs over the next week. With that being said the patient states that he is unable to even get in bed due to the fact that he has back trouble and has to sleep in his recliner. He never really gets in the bed at all. This is probably part of the issue coupled with his heart not functioning quite as well as what we  would like to see and then subsequently he also has the issue of being overweight which contributes to this as well. Overall were still trying to figure out how to best manage this to get his tweaking under control and prevent anything from worsening. Again Dr. Drucilla Schmidt really did not feel like there was any evidence of infectious processes going on at this point. Electronic Signature(s) Signed: 11/29/2019 9:08:27 AM By: Worthy Keeler PA-C Entered By: Worthy Keeler on 11/29/2019 09:08:27 [name] -------------------------------------------------------------------------------- Physical Exam Details Patient Name: [name], [name]. Date of Service: 11/29/2019 8:30 AM Medical Record Number: [id number] Patient Account Number: [id number] Date of Birth/Sex: 1951-08-21 (69 y.o. M) Treating RN: Army Melia Primary Care Provider: Aura Dials Other Clinician: Referring Provider: Aura Dials Treating Provider/Extender: STONE III, Shaquasia Caponigro Weeks in Treatment: 9 Constitutional Obese and well-hydrated in no acute distress. Respiratory normal breathing without difficulty. Psychiatric this patient is able to make decisions and demonstrates good insight into disease process. Alert and Oriented x 3. pleasant and cooperative. Notes Patient's lower extremity currently show signs of continued weeping and erythema secondary to stasis dermatitis. Fortunately there is No worsening as far as evidence of infection based on what I see unfortunately the patient continues to have issues with weeping and again I am not sure will make it as much progress as I would like to see despite the increased diuresis. He did not however pick up the metolazone at this point that was prescribed for him he should go get that ASAP in my opinion and take it as directed by his cardiologist. Electronic Signature(s) Signed: 11/29/2019 9:10:10 AM By: Worthy Keeler PA-C Entered By: Worthy Keeler on  11/29/2019 09:10:10 Rodrigue, Adrienne [name] ([id number]) -------------------------------------------------------------------------------- Physician Orders Details Patient Name: [name]. Date of Service: 11/29/2019 8:30 AM Medical Record Number: [id number] Patient Account Number: [id number] Date of Birth/Sex: 11/30/1950 (69 y.o. M) Treating RN: Army Melia Primary Care Provider: Aura Dials Other Clinician: Referring Provider: Aura Dials Treating Provider/Extender:  STONE III, Inessa Wardrop Weeks in Treatment: 9 Verbal / Phone Orders: No Diagnosis Coding ICD-10 Coding Code Description I89.0 Lymphedema, not elsewhere classified I87.2 Venous insufficiency (chronic) (peripheral) E11.628 Type 2 diabetes mellitus with other skin complications [id number] Essential (primary) hypertension I50.42 Chronic combined systolic (congestive) and diastolic (congestive) heart failure L02.416 Cutaneous abscess of left lower limb Wound Cleansing o Clean wound with Normal Saline. - in office o Cleanse wound with mild soap and water - left leg Primary Wound Dressing o Silver Alginate - over weeping areas Secondary Dressing o XtraSorb Dressing Change Frequency o Dressing is to be changed Monday and Thursday. - Nurse visit as needed Follow-up Appointments o Return Appointment in 1 week. Edema Control o 4-Layer Compression System - Left Lower Extremity. o Other: Louretta Parma to Adult nurse) Signed: 11/29/2019 2:34:43 PM By: Army Melia Signed: 11/29/2019 3:59:29 PM By: Worthy Keeler PA-C Entered By: Army Melia on 11/29/2019 09:01:10 Parchment, Adrienne [name] ([id number]) -------------------------------------------------------------------------------- Problem List Details Patient Name: [name], [name]. Date of Service: 11/29/2019 8:30 AM Medical Record Number: [id number] Patient Account Number: [id number] Date of Birth/Sex: May 24, 1951 (69 y.o. M) Treating RN: Army Melia Primary  Care Provider: Aura Dials Other Clinician: Referring Provider: Aura Dials Treating Provider/Extender: Melburn Hake, Wilda Wetherell Weeks in Treatment: 9 Active Problems ICD-10 Evaluated Encounter Code Description Active Date Today Diagnosis I89.0 Lymphedema, not elsewhere classified 09/27/2019 No Yes I87.2 Venous insufficiency (chronic) (peripheral) 09/27/2019 No Yes E11.628 Type 2 diabetes mellitus with other skin complications 123XX123 No Yes I10 Essential (primary) hypertension 09/27/2019 No Yes I50.42 Chronic combined systolic (congestive) and diastolic 123XX123 No Yes (congestive) heart failure L02.416 Cutaneous abscess of left lower limb 09/27/2019 No Yes Inactive Problems Resolved Problems Electronic Signature(s) Signed: 11/29/2019 8:50:56 AM By: Worthy Keeler PA-C Entered By: Worthy Keeler on 11/29/2019 08:50:55 Neumann, Adrienne [name] ([id number]) -------------------------------------------------------------------------------- Progress Note Details Patient Name: [name]. Date of Service: 11/29/2019 8:30 AM Medical Record Number: [id number] Patient Account Number: [id number] Date of Birth/Sex: November 02, 1951 (69 y.o. M) Treating RN: Army Melia Primary Care Provider: Aura Dials Other Clinician: Referring Provider: Aura Dials Treating Provider/Extender: Melburn Hake, Dinesha Twiggs Weeks in Treatment: 9 Subjective Chief Complaint Information obtained from Patient Bilateral LE lymphedema left leg cellulitis History of Present Illness (HPI) 09/27/2019 on evaluation today patient appears to be doing poorly in regard to his bilateral lower extremities he does have bilateral lower extremity lymphedema. With that being said he also has a history of diabetes type 2, hypertension, congestive heart failure, and apparently has chronic venous insufficiency which has led to the lymphedema. Currently he also shows signs of an infection of the left lower extremity which does have me  concerned at this point as well. He tells me that he is not really having any significant pain but has had a lot of trouble with his legs in general. He does have a compression stocking he is wearing on the right lower extremity he was wrapped with an Unna boot on the left lower extremity. The patient tells me that overall he has been doing with this for several weeks and home health has been initiated. He has not been on any antibiotics yet and he tells me that the home health nurse was not sure that it was infected based on what I see today I am concerned that this likely is infected at this point. Fortunately however there does not appear to be any evidence of systemic infection which is good news. The Unna boot does seem to  be helping control the edema although he may benefit from a stronger compression wrap I think that at this point without having the ABIs completed the Unna boot is where I would stand. He is supposed to be having an arterial study with ABI as ordered by his cardiologist but he states he may have missed that appointment and has not called to reschedule he needs to contact them to get this rescheduled as soon as possible I explained to him. He tells me that he is draining a lot as far as the left lower extremity is concerned and that is something that he feels like is not lasting very long as far as the wraps are concerned when they put him on they seem to get wet extremely quickly. 10/04/2019 on evaluation today patient appears to be doing better compared to last week's evaluation. Fortunately there is no signs of active infection at this time. He has been tolerating the dressing changes without complication. He did undergo arterial studies yesterday and has an appointment tomorrow with the vascular specialist. With that being said his ABI on the left was 1.25 on the right 1.19 with a TBI of 0.61 on the left and 0.69 on the right. There was stated to be no evidence of obvious  significant arterial disease and the patient had pretty much triphasic blood flow throughout. Overall I am very pleased with how things seem to be progressing. We will see if the vascular doctor says anything different tomorrow but I even at this point I feel like he would do well with a stronger compression wrap to try to get some the fluid out of his leg at this point. The good news is his drainage has slowed down considerably I do believe the antibiotics have been beneficial for him. 10/18/2019 on evaluation today patient appears to be doing poorly in regard to his left lower extremity. He still having a lot of drainage and weeping from his leg this appears to be much more erythematous and in fact I am concerned that the erythema may be spreading up his leg as well. He has not experienced any fevers, chills, nausea, vomiting, or diarrhea up to this point. I explained to him that I really feel like based on what I am seeing he may need to potentially go to the hospital for evaluation and treatment potentially even with IV antibiotics depending on what they see. With that being said he tells me that he is not really able to do that due to the fact that he is the primary caregiver for his wife who is legally blind. He states he is not good to be able to have anything to help as far as caring for her if he were to go into the hospital. Nonetheless he is unsure what to do in this regard. I really think that he needs faster treatment. For that reason I am going to likely try to get in touch with the infectious disease office to see if I can get him in sooner than [place]. 10/25/2019 on evaluation today patient appears to be doing a little better in regard to his weeping and edema compared to the last time I saw him. The dorsal surface of his foot is a little bit drier compared to what was this is good news. He did see Dr. Drucilla Schmidt on 10/22/2019. Dr. Drucilla Schmidt did feel that potentially this could be secondary  to some cellulitis and he recommended subsequently placing the patient on Zyvox which the patient  feels like has helped as well. There does not appear to be any signs of active infection at this time which is good news systemically. I am very pleased that Dr. Luther Parody, [facility] M. ([id number]) was able to see him so quickly I do appreciate this greatly. With that being said he also did recommend an MRI for the patient to evaluate for any deeper signs of infection. That is actually scheduled for this coming Tuesday. He also has a follow-up appoint with Dr. Drucilla Schmidt on Tuesday. The last thing Dr. Drucilla Schmidt did mention was the possibility of more aggressive diuresis. The patient is on torsemide. With that being said this is managed by his cardiologist. I think we may want to get in touch with her clinic and see if possibly this is something that could be increased for a short amount of time depending on their recommendations to see if we can get some additional fluid off of him which in turn would likely help tremendously with his weeping and edema. He is in agreement with this plan if his cardiologist is in agreement. 11/01/2019 on evaluation today patient unfortunately notes that he did not get his MRI done which was ordered by Dr. Drucilla Schmidt. Apparently when he went in it was 6:30 in the morning and they told him that he was there to get an MRI of his toes. With that being said the MRI was ordered of his ankle and foot and so long story short the patient said that he was not getting MRI of his toes and left. Dr. Drucilla Schmidt was alerted and again it does not appear that at all that was what he ordered that he did order the ankle and foot but nonetheless that is beside the point at this time. The patient still has considerable edema noted at this time the nurse that came out last after just put a Kerlix and Coban wrap on the patient she did not even properly wrap him. Subsequently they have also discharged him  being that they state he is not skilled at this point. Therefore he is going to have to be seen here in the office only. Fortunately there is no signs of systemic infection he still has a lot of erythema around the ankle and lower extremity region again there are any specific open wounds just general weeping and evidence of erythema which Dr. Drucilla Schmidt is questioning whether or not this is even infection or if it is just more secondary to the patient's lymphedema. He has had venous reflux studies which were negative for any abnormal findings unfortunately that is also not good to be an answer for [place] here. I have recommended the patient today contact his primary care provider to see if they can increase his fluid pills and I also think we need to increase his compression wrap to a 4-layer compression wrap to try to more effectively manage his edema. 11/08/2019 on evaluation today patient appears unfortunately to still be doing poorly. I did speak with his primary care provider office last week they were supposed to call him to advise what should be done as far as his fluid pills were concerned. With that being said they did not contact him as far as the patient tells me. I am not really sure exactly what is going on as far as that is concerned. Nonetheless I really think that the patient needs to be admitted to the hospital for treatment likely as I explained to him he is going to require IV  Lasix to get fluid off and to be honest also think he may need IV antibiotics. I have made all the outpatient referrals I can to try to get things under control he has been on oral antibiotics I really just have not seen any significant improvement he was even on linezolid. That was prescribed By infectious disease. Overall I am very concerned about the fact that this is not getting better. 11/22/2019 on evaluation today patient unfortunately is not doing very well with regard to his lower extremity edema. The  left lower extremity is significantly swollen and still significantly erythematous. He has bright green drainage. He did just see Dr. Drucilla Schmidt on 11/19/2019 and Dr. Drucilla Schmidt did not feel like that this was infected. Subsequently he states that he feels like the issue is more 1 of not being at maximal diuresis and recommended the patient see his cardiologist to ensure that this was achieved. Subsequently Dr. Drucilla Schmidt states this is a issue with venous stasis dermatitis which again I completely understand that that may definitely be the case but unfortunately we've not been able to get things under control even with maximum compression at a 4 layer compression wrap. The patient is still having significant issues. Either way I do think the patient is getting need to follow-up with his cardiologist to see what we can do Dr. Drucilla Schmidt did make mention that the patient had a flareup of infection that Keflex 500 mg 4 times a day or Augmentin twice a day would be good options for nonpurulent cellulitis. The patient was again supposed to have gone to the hospital on the weekend following Christmas after he got things settled with family as far as getting someone to take care of his wife. With that being said he tells me that when it came around to that time he just didn't think about it. When asked him about why the drainage from his leg and the issues he is having that and remind him he told me "well it really hasn't changed". 11/29/2019 on evaluation today patient presents for follow-up concerning his left lower extremity edema and weeping. He did see his cardiologist at my request after I discussed with him the situation as well. This is Binnie Kand who is a family Designer, jewellery that he saw. Subsequently the patient was recommended to start metolazone 5 mg daily for the next 5 days. They also recommended that really he needed essentially bed rest with elevation of his legs over the next week. With that  being said the patient states that he is unable to even get in bed due to the fact that he has back trouble and has to sleep in his recliner. He never really gets in the bed at all. This is probably part of the issue coupled with his heart not functioning quite as well as what we would like to see and then subsequently he also has the issue of being overweight which contributes to this as well. Overall were still trying to figure out how to best manage this to get his tweaking under control and prevent anything from worsening. Again Dr. Drucilla Schmidt really did not feel like there was any evidence of infectious processes going on at this point. [name], [name] Objective Constitutional Obese and well-hydrated in no acute distress. Vitals Time Taken: 8:38 AM, Height: 69 in, Weight: 295 lbs, BMI: 43.6, Temperature: 98.4 F, Pulse: 87 bpm, Respiratory Rate: 18 breaths/min, Blood Pressure: 142/69 mmHg. Respiratory normal breathing without difficulty. Psychiatric this patient  is able to make decisions and demonstrates good insight into disease process. Alert and Oriented x 3. pleasant and cooperative. General Notes: Patient's lower extremity currently show signs of continued weeping and erythema secondary to stasis dermatitis. Fortunately there is No worsening as far as evidence of infection based on what I see unfortunately the patient continues to have issues with weeping and again I am not sure will make it as much progress as I would like to see despite the increased diuresis. He did not however pick up the metolazone at this point that was prescribed for him he should go get that ASAP in my opinion and take it as directed by his cardiologist. Other Condition(s) Patient presents with Lymphedema located on the Bilateral Leg. 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 Post procedure Diagnosis Wound #: Same as Pre-Procedure Mccart, JOANN SARSOUR ([id number]) Plan Wound Cleansing: Clean wound with Normal Saline. - in office Cleanse wound with mild soap and water - left leg Primary Wound Dressing: Silver Alginate - over weeping areas Secondary Dressing: XtraSorb Dressing Change Frequency: Dressing is to be changed Monday and Thursday. - Nurse visit as needed Follow-up Appointments: Return Appointment in 1 week. Edema Control: 4-Layer Compression System - Left Lower Extremity. Other: - Unna to anchor wrap 1. My suggestion at this point based on what I am seeing is good to be that we continue with the current wound care measures including the compression wraps at this time. I do believe that this has the best chance of trying to see things improve. With that being said the patient needs to be elevating his legs he really needs to be in bed but he tells me that is not can be able to happen due to his back pain. For that reason is can to try to see what he can do to elevate his legs as best he can. This is a 4 layer compression wrap. 2. We will continue with the silver alginate dressings followed by Lauraine Rinne as well. 3. I am also going to recommend that we have the patient continue to take the medications as directed by his cardiologist including the metolazone he needs to go pick that up today. 4. I am going also recommend going ahead and seeing about setting the patient up for lymphedema pumps or least an evaluation for such. I think this can definitely be something that could be beneficial for him. Currently he has obvious signs of lymphedema and at this point I would say that he is at the stage II lymphedema  location. He does still have some pitting edema roughly 2+. We have been recommending elevation which the patient states he is trying to do as much as he can in his recliner. We have also been compressing him with a 4-layer compression wrap. Despite this he continues to have symptomatic lymphedema with weeping. We have been seeing the patient since September 27, 2019 here in our clinic and attempting to control his weeping and lymphedema during that time without significant success. Have also recommended exercise for him but again he is limited in what he can do in some regard due to his back which I also understand. Despite all the conservative treatments the patient persist with his lymphedema  at this point. This is where I feel the lymphedema pumps could be beneficial for him currently. We will see patient back for reevaluation in 1 week here in the clinic. If anything worsens or changes patient will contact our office for additional recommendations. Electronic Signature(s) Signed: 11/29/2019 9:13:01 AM By: Worthy Keeler PA-C Entered By: Worthy Keeler on 11/29/2019 09:13:00 Folkert, Adrienne [name] ([id number]) -------------------------------------------------------------------------------- SuperBill Details Patient Name: [name]. Date of Service: 11/29/2019 Medical Record Number: [id number] Patient Account Number: [id number] Date of Birth/Sex: [birth year] (68 y.o. M) Treating RN: Army Melia Primary Care Provider: Aura Dials Other Clinician: Referring Provider: Aura Dials Treating Provider/Extender: Melburn Hake, Laurinda Carreno Weeks in Treatment: 9 Diagnosis Coding ICD-10 Codes Code Description I89.0 Lymphedema, not elsewhere classified I87.2 Venous insufficiency (chronic) (peripheral) E11.628 Type 2 diabetes mellitus with other skin complications [id number] Essential (primary) hypertension I50.42 Chronic combined systolic (congestive) and diastolic (congestive) heart failure L02.416  Cutaneous abscess of left lower limb Facility Procedures CPT4 Code: [id number] Description: (Facility Use Only) [phone] - [facility] LT LEG Modifier: Quantity: 1 Physician Procedures CPT4 Code: [id number] Description: 99214 - WC PHYS LEVEL 4 - EST PT ICD-10 Diagnosis Description I89.0 Lymphedema, not elsewhere classified I87.2 Venous insufficiency (chronic) (peripheral) E11.628 Type 2 diabetes mellitus with other skin complications [id number] Essential (primary)  hypertension Modifier: Quantity: 1 Electronic Signature(s) Signed: 11/29/2019 9:13:12 AM By: Worthy Keeler PA-C Entered By: Worthy Keeler on 11/29/2019 09:13:12

## 2019-12-06 ENCOUNTER — Other Ambulatory Visit: Payer: Self-pay

## 2019-12-06 ENCOUNTER — Encounter: Payer: Medicare Other | Admitting: Physician Assistant

## 2019-12-06 DIAGNOSIS — I890 Lymphedema, not elsewhere classified: Secondary | ICD-10-CM | POA: Diagnosis not present

## 2019-12-06 NOTE — Progress Notes (Addendum)
REUBIN, MCELHENNEY ([id number]) Visit Report for 12/06/2019 Chief Complaint Document Details Patient Name: [name], [name]. Date of Service: 12/06/2019 8:30 AM Medical Record Number: [id number] Patient Account Number: [id number] Date of Birth/Sex: [birth year] (69 y.o. M) Treating RN: Army Melia Primary Care Provider: Aura Dials Other Clinician: Referring Provider: Aura Dials Treating Provider/Extender: Melburn Hake, Lamaria Hildebrandt Weeks in Treatment: 10 Information Obtained from: Patient Chief Complaint Bilateral LE lymphedema left leg cellulitis Electronic Signature(s) Signed: 12/06/2019 9:23:38 AM By: Worthy Keeler PA-C Entered By: Worthy Keeler on 12/06/2019 09:23:37 [name], [name] -------------------------------------------------------------------------------- HPI Details Patient Name: [name]. Date of Service: 12/06/2019 8:30 AM Medical Record Number: [id number] Patient Account Number: [id number] Date of Birth/Sex: [birth year] (69 y.o. M) Treating RN: Army Melia Primary Care Provider: Aura Dials Other Clinician: Referring Provider: Aura Dials Treating Provider/Extender: Melburn Hake, Kanon Novosel Weeks in Treatment: 10 History of Present Illness HPI Description: 09/27/2019 on evaluation today patient appears to be doing poorly in regard to his bilateral lower extremities he does have bilateral lower extremity lymphedema. With that being said he also has a history of diabetes type 2, hypertension, congestive heart failure, and apparently has chronic venous insufficiency which has led to the lymphedema. Currently he also shows signs of an infection of the left lower extremity which does have me concerned at this point as well. He tells me that he is not really having any significant pain but has had a lot of trouble with his legs in general. He does have a compression stocking he is wearing on the right lower extremity he was wrapped with an Unna boot on the left  lower extremity. The patient tells me that overall he has been doing with this for several weeks and home health has been initiated. He has not been on any antibiotics yet and he tells me that the home health nurse was not sure that it was infected based on what I see today I am concerned that this likely is infected at this point. Fortunately however there does not appear to be any evidence of systemic infection which is good news. The Unna boot does seem to be helping control the edema although he may benefit from a stronger compression wrap I think that at this point without having the ABIs completed the Unna boot is where I would stand. He is supposed to be having an arterial study with ABI as ordered by his cardiologist but he states he may have missed that appointment and has not called to reschedule he needs to contact them to get this rescheduled as soon as possible I explained to him. He tells me that he is draining a lot as far as the left lower extremity is concerned and that is something that he feels like is not lasting very long as far as the wraps are concerned when they put him on they seem to get wet extremely quickly. 10/04/2019 on evaluation today patient appears to be doing better compared to last week's evaluation. Fortunately there is no signs of active infection at this time. He has been tolerating the dressing changes without complication. He did undergo arterial studies yesterday and has an appointment tomorrow with the vascular specialist. With that being said his ABI on the left was 1.25 on the right 1.19 with a TBI of 0.61 on the left and 0.69 on the right. There was stated to be no evidence of obvious significant arterial disease and the patient had pretty much triphasic blood flow throughout. Overall  I am very pleased with how things seem to be progressing. We will see if the vascular doctor says anything different tomorrow but I even at this point I feel like he would  do well with a stronger compression wrap to try to get some the fluid out of his leg at this point. The good news is his drainage has slowed down considerably I do believe the antibiotics have been beneficial for him. 10/18/2019 on evaluation today patient appears to be doing poorly in regard to his left lower extremity. He still having a lot of drainage and weeping from his leg this appears to be much more erythematous and in fact I am concerned that the erythema may be spreading up his leg as well. He has not experienced any fevers, chills, nausea, vomiting, or diarrhea up to this point. I explained to him that I really feel like based on what I am seeing he may need to potentially go to the hospital for evaluation and treatment potentially even with IV antibiotics depending on what they see. With that being said he tells me that he is not really able to do that due to the fact that he is the primary caregiver for his wife who is legally blind. He states he is not good to be able to have anything to help as far as caring for her if he were to go into the hospital. Nonetheless he is unsure what to do in this regard. I really think that he needs faster treatment. For that reason I am going to likely try to get in touch with the infectious disease office to see if I can get him in sooner than [place]. 10/25/2019 on evaluation today patient appears to be doing a little better in regard to his weeping and edema compared to the last time I saw him. The dorsal surface of his foot is a little bit drier compared to what was this is good news. He did see Dr. Drucilla Schmidt on 10/22/2019. Dr. Drucilla Schmidt did feel that potentially this could be secondary to some cellulitis and he recommended subsequently placing the patient on Zyvox which the patient feels like has helped as well. There does not appear to be any signs of active infection at this time which is good news systemically. I am very pleased that Dr. Drucilla Schmidt was  able to see him so quickly I do appreciate this greatly. With that being said he also did recommend an MRI for the patient to evaluate for any deeper signs of infection. That is actually scheduled for this coming Tuesday. He also has a follow-up appoint with Dr. Drucilla Schmidt on Tuesday. The last thing Dr. Drucilla Schmidt did mention was the possibility of more aggressive diuresis. The patient is on torsemide. With that being said this is managed by his cardiologist. I think we may want to get in touch with her clinic and see if possibly this is something that could be increased for a short amount of time depending on their recommendations to see if we can get some additional fluid off of him which in turn would likely help tremendously with his QUI, SIT. ([id number]) weeping and edema. He is in agreement with this plan if his cardiologist is in agreement. 11/01/2019 on evaluation today patient unfortunately notes that he did not get his MRI done which was ordered by Dr. Drucilla Schmidt. Apparently when he went in it was 6:30 in the morning and they told him that he was there to get an MRI of  his toes. With that being said the MRI was ordered of his ankle and foot and so long story short the patient said that he was not getting MRI of his toes and left. Dr. Drucilla Schmidt was alerted and again it does not appear that at all that was what he ordered that he did order the ankle and foot but nonetheless that is beside the point at this time. The patient still has considerable edema noted at this time the nurse that came out last after just put a Kerlix and Coban wrap on the patient she did not even properly wrap him. Subsequently they have also discharged him being that they state he is not skilled at this point. Therefore he is going to have to be seen here in the office only. Fortunately there is no signs of systemic infection he still has a lot of erythema around the ankle and lower extremity region again there are any  specific open wounds just general weeping and evidence of erythema which Dr. Drucilla Schmidt is questioning whether or not this is even infection or if it is just more secondary to the patient's lymphedema. He has had venous reflux studies which were negative for any abnormal findings unfortunately that is also not good to be an answer for [place] here. I have recommended the patient today contact his primary care provider to see if they can increase his fluid pills and I also think we need to increase his compression wrap to a 4-layer compression wrap to try to more effectively manage his edema. 11/08/2019 on evaluation today patient appears unfortunately to still be doing poorly. I did speak with his primary care provider office last week they were supposed to call him to advise what should be done as far as his fluid pills were concerned. With that being said they did not contact him as far as the patient tells me. I am not really sure exactly what is going on as far as that is concerned. Nonetheless I really think that the patient needs to be admitted to the hospital for treatment likely as I explained to him he is going to require IV Lasix to get fluid off and to be honest also think he may need IV antibiotics. I have made all the outpatient referrals I can to try to get things under control he has been on oral antibiotics I really just have not seen any significant improvement he was even on linezolid. That was prescribed By infectious disease. Overall I am very concerned about the fact that this is not getting better. 11/22/2019 on evaluation today patient unfortunately is not doing very well with regard to his lower extremity edema. The left lower extremity is significantly swollen and still significantly erythematous. He has bright green drainage. He did just see Dr. Drucilla Schmidt on 11/19/2019 and Dr. Drucilla Schmidt did not feel like that this was infected. Subsequently he states that he feels like the issue is more 1 of  not being at maximal diuresis and recommended the patient see his cardiologist to ensure that this was achieved. Subsequently Dr. Drucilla Schmidt states this is a issue with venous stasis dermatitis which again I completely understand that that may definitely be the case but unfortunately we've not been able to get things under control even with maximum compression at a 4 layer compression wrap. The patient is still having significant issues. Either way I do think the patient is getting need to follow-up with his cardiologist to see what we can do Dr. Drucilla Schmidt  did make mention that the patient had a flareup of infection that Keflex 500 mg 4 times a day or Augmentin twice a day would be good options for nonpurulent cellulitis. The patient was again supposed to have gone to the hospital on the weekend following Christmas after he got things settled with family as far as getting someone to take care of his wife. With that being said he tells me that when it came around to that time he just didn't think about it. When asked him about why the drainage from his leg and the issues he is having that and remind him he told me "well it really hasn't changed". 11/29/2019 on evaluation today patient presents for follow-up concerning his left lower extremity edema and weeping. He did see his cardiologist at my request after I discussed with him the situation as well. This is Binnie Kand who is a family Designer, jewellery that he saw. Subsequently the patient was recommended to start metolazone 5 mg daily for the next 5 days. They also recommended that really he needed essentially bed rest with elevation of his legs over the next week. With that being said the patient states that he is unable to even get in bed due to the fact that he has back trouble and has to sleep in his recliner. He never really gets in the bed at all. This is probably part of the issue coupled with his heart not functioning quite as well as what we  would like to see and then subsequently he also has the issue of being overweight which contributes to this as well. Overall were still trying to figure out how to best manage this to get his tweaking under control and prevent anything from worsening. Again Dr. Drucilla Schmidt really did not feel like there was any evidence of infectious processes going on at this point. 12/06/2019 on evaluation today patient's leg actually appears to be doing better with regard to edema control I am actually pleased in this regard. He also seems to be improving as far as drainage is concerned from the leg this is much better than what it was in the past. Fortunately there is no signs of active infection which is good news and overall the patient states he is actually happier with where things stand at this point. Electronic Signature(s) Signed: 12/06/2019 10:30:20 AM By: Irean Hong Vitug, [place]. ([id number]) Entered By: Worthy Keeler on 12/06/2019 10:30:19 Lucchesi, [name] -------------------------------------------------------------------------------- Physical Exam Details Patient Name: [name], [name]. Date of Service: 12/06/2019 8:30 AM Medical Record Number: [id number] Patient Account Number: [id number] Date of Birth/Sex: 06-Feb-1951 (70 y.o. M) Treating RN: Army Melia Primary Care Provider: Aura Dials Other Clinician: Referring Provider: Aura Dials Treating Provider/Extender: STONE III, Nancy Arvin Weeks in Treatment: 10 Constitutional Well-nourished and well-hydrated in no acute distress. Respiratory normal breathing without difficulty. Psychiatric this patient is able to make decisions and demonstrates good insight into disease process. Alert and Oriented x 3. pleasant and cooperative. Notes Patient's wound bed currently showed signs of good Epithelization at this time. Fortunately there is no signs of active infection which is excellent news. No fevers, chills, nausea,  vomiting, or diarrhea. Overall I am very pleased with how things seem to be progressing. I do think he is getting need new compression stockings which I did recommend he go ahead and check into obtaining. Electronic Signature(s) Signed: 12/06/2019 10:30:57 AM By: Worthy Keeler PA-C Entered By: Worthy Keeler on 12/06/2019 10:30:57  [name], [name] -------------------------------------------------------------------------------- Physician Orders Details Patient Name: [name], [name]. Date of Service: 12/06/2019 8:30 AM Medical Record Number: [id number] Patient Account Number: [id number] Date of Birth/Sex: 05-Dec-1950 (69 y.o. M) Treating RN: Army Melia Primary Care Provider: Aura Dials Other Clinician: Referring Provider: Aura Dials Treating Provider/Extender: Melburn Hake, Gregroy Dombkowski Weeks in Treatment: 10 Verbal / Phone Orders: No Diagnosis Coding ICD-10 Coding Code Description I89.0 Lymphedema, not elsewhere classified I87.2 Venous insufficiency (chronic) (peripheral) E11.628 Type 2 diabetes mellitus with other skin complications 99991111 Essential (primary) hypertension I50.42 Chronic combined systolic (congestive) and diastolic (congestive) heart failure L02.416 Cutaneous abscess of left lower limb Wound Cleansing o Clean wound with Normal Saline. - in office o Cleanse wound with mild soap and water - left leg Skin Barriers/Peri-Wound Care o Other: - Anti-bacterial spray Secondary Dressing o XtraSorb Dressing Change Frequency o Dressing is to be changed Monday and Thursday. - Nurse visit as needed Follow-up Appointments o Return Appointment in 1 week. Edema Control o 4-Layer Compression System - Left Lower Extremity. o Other: - Unna to Adult nurse) Signed: 12/06/2019 4:17:07 PM By: Army Melia Signed: 12/06/2019 5:39:02 PM By: Worthy Keeler PA-C Entered By: Army Melia on 12/06/2019 [id number] [name]  ([id number]) -------------------------------------------------------------------------------- Problem List Details Patient Name: [name], [name]. Date of Service: 12/06/2019 8:30 AM Medical Record Number: [id number] Patient Account Number: [id number] Date of Birth/Sex: [birth year] (69 y.o. M) Treating RN: Army Melia Primary Care Provider: Aura Dials Other Clinician: Referring Provider: Aura Dials Treating Provider/Extender: Melburn Hake, Yana Schorr Weeks in Treatment: 10 Active Problems ICD-10 Evaluated Encounter Code Description Active Date Today Diagnosis I89.0 Lymphedema, not elsewhere classified 09/27/2019 No Yes I87.2 Venous insufficiency (chronic) (peripheral) 09/27/2019 No Yes E11.628 Type 2 diabetes mellitus with other skin complications 123XX123 No Yes I10 Essential (primary) hypertension 09/27/2019 No Yes I50.42 Chronic combined systolic (congestive) and diastolic 123XX123 No Yes (congestive) heart failure L02.416 Cutaneous abscess of left lower limb 09/27/2019 No Yes Inactive Problems Resolved Problems Electronic Signature(s) Signed: 12/06/2019 9:23:16 AM By: Worthy Keeler PA-C Entered By: Worthy Keeler on 12/06/2019 09:23:16 Therrell, [name] -------------------------------------------------------------------------------- Progress Note Details Patient Name: [name]. Date of Service: 12/06/2019 8:30 AM Medical Record Number: [id number] Patient Account Number: [id number] Date of Birth/Sex: 11-Dec-1950 (69 y.o. M) Treating RN: Army Melia Primary Care Provider: Aura Dials Other Clinician: Referring Provider: Aura Dials Treating Provider/Extender: Melburn Hake, Dora Clauss Weeks in Treatment: 10 Subjective Chief Complaint Information obtained from Patient Bilateral LE lymphedema left leg cellulitis History of Present Illness (HPI) 09/27/2019 on evaluation today patient appears to be doing poorly in regard to his bilateral lower extremities  he does have bilateral lower extremity lymphedema. With that being said he also has a history of diabetes type 2, hypertension, congestive heart failure, and apparently has chronic venous insufficiency which has led to the lymphedema. Currently he also shows signs of an infection of the left lower extremity which does have me concerned at this point as well. He tells me that he is not really having any significant pain but has had a lot of trouble with his legs in general. He does have a compression stocking he is wearing on the right lower extremity he was wrapped with an Unna boot on the left lower extremity. The patient tells me that overall he has been doing with this for several weeks and home health has been initiated. He has not been on any antibiotics yet and he tells me that the home  health nurse was not sure that it was infected based on what I see today I am concerned that this likely is infected at this point. Fortunately however there does not appear to be any evidence of systemic infection which is good news. The Unna boot does seem to be helping control the edema although he may benefit from a stronger compression wrap I think that at this point without having the ABIs completed the Unna boot is where I would stand. He is supposed to be having an arterial study with ABI as ordered by his cardiologist but he states he may have missed that appointment and has not called to reschedule he needs to contact them to get this rescheduled as soon as possible I explained to him. He tells me that he is draining a lot as far as the left lower extremity is concerned and that is something that he feels like is not lasting very long as far as the wraps are concerned when they put him on they seem to get wet extremely quickly. 10/04/2019 on evaluation today patient appears to be doing better compared to last week's evaluation. Fortunately there is no signs of active infection at this time. He has  been tolerating the dressing changes without complication. He did undergo arterial studies yesterday and has an appointment tomorrow with the vascular specialist. With that being said his ABI on the left was 1.25 on the right 1.19 with a TBI of 0.61 on the left and 0.69 on the right. There was stated to be no evidence of obvious significant arterial disease and the patient had pretty much triphasic blood flow throughout. Overall I am very pleased with how things seem to be progressing. We will see if the vascular doctor says anything different tomorrow but I even at this point I feel like he would do well with a stronger compression wrap to try to get some the fluid out of his leg at this point. The good news is his drainage has slowed down considerably I do believe the antibiotics have been beneficial for him. 10/18/2019 on evaluation today patient appears to be doing poorly in regard to his left lower extremity. He still having a lot of drainage and weeping from his leg this appears to be much more erythematous and in fact I am concerned that the erythema may be spreading up his leg as well. He has not experienced any fevers, chills, nausea, vomiting, or diarrhea up to this point. I explained to him that I really feel like based on what I am seeing he may need to potentially go to the hospital for evaluation and treatment potentially even with IV antibiotics depending on what they see. With that being said he tells me that he is not really able to do that due to the fact that he is the primary caregiver for his wife who is legally blind. He states he is not good to be able to have anything to help as far as caring for her if he were to go into the hospital. Nonetheless he is unsure what to do in this regard. I really think that he needs faster treatment. For that reason I am going to likely try to get in touch with the infectious disease office to see if I can get him in sooner than  [place]. 10/25/2019 on evaluation today patient appears to be doing a little better in regard to his weeping and edema compared to the last time I saw him. The dorsal  surface of his foot is a little bit drier compared to what was this is good news. He did see Dr. Drucilla Schmidt on 10/22/2019. Dr. Drucilla Schmidt did feel that potentially this could be secondary to some cellulitis and he recommended subsequently placing the patient on Zyvox which the patient feels like has helped as well. There does not appear to be any signs of active infection at this time which is good news systemically. I am very pleased that Dr. Luther Parody, [place] M. ([id number]) was able to see him so quickly I do appreciate this greatly. With that being said he also did recommend an MRI for the patient to evaluate for any deeper signs of infection. That is actually scheduled for this coming Tuesday. He also has a follow-up appoint with Dr. Drucilla Schmidt on Tuesday. The last thing Dr. Drucilla Schmidt did mention was the possibility of more aggressive diuresis. The patient is on torsemide. With that being said this is managed by his cardiologist. I think we may want to get in touch with her clinic and see if possibly this is something that could be increased for a short amount of time depending on their recommendations to see if we can get some additional fluid off of him which in turn would likely help tremendously with his weeping and edema. He is in agreement with this plan if his cardiologist is in agreement. 11/01/2019 on evaluation today patient unfortunately notes that he did not get his MRI done which was ordered by Dr. Drucilla Schmidt. Apparently when he went in it was 6:30 in the morning and they told him that he was there to get an MRI of his toes. With that being said the MRI was ordered of his ankle and foot and so long story short the patient said that he was not getting MRI of his toes and left. Dr. Drucilla Schmidt was alerted and again it does not appear that  at all that was what he ordered that he did order the ankle and foot but nonetheless that is beside the point at this time. The patient still has considerable edema noted at this time the nurse that came out last after just put a Kerlix and Coban wrap on the patient she did not even properly wrap him. Subsequently they have also discharged him being that they state he is not skilled at this point. Therefore he is going to have to be seen here in the office only. Fortunately there is no signs of systemic infection he still has a lot of erythema around the ankle and lower extremity region again there are any specific open wounds just general weeping and evidence of erythema which Dr. Drucilla Schmidt is questioning whether or not this is even infection or if it is just more secondary to the patient's lymphedema. He has had venous reflux studies which were negative for any abnormal findings unfortunately that is also not good to be an answer for [place] here. I have recommended the patient today contact his primary care provider to see if they can increase his fluid pills and I also think we need to increase his compression wrap to a 4-layer compression wrap to try to more effectively manage his edema. 11/08/2019 on evaluation today patient appears unfortunately to still be doing poorly. I did speak with his primary care provider office last week they were supposed to call him to advise what should be done as far as his fluid pills were concerned. With that being said they did not contact him as far  as the patient tells me. I am not really sure exactly what is going on as far as that is concerned. Nonetheless I really think that the patient needs to be admitted to the hospital for treatment likely as I explained to him he is going to require IV Lasix to get fluid off and to be honest also think he may need IV antibiotics. I have made all the outpatient referrals I can to try to get things under control he has been on  oral antibiotics I really just have not seen any significant improvement he was even on linezolid. That was prescribed By infectious disease. Overall I am very concerned about the fact that this is not getting better. 11/22/2019 on evaluation today patient unfortunately is not doing very well with regard to his lower extremity edema. The left lower extremity is significantly swollen and still significantly erythematous. He has bright green drainage. He did just see Dr. Drucilla Schmidt on 11/19/2019 and Dr. Drucilla Schmidt did not feel like that this was infected. Subsequently he states that he feels like the issue is more 1 of not being at maximal diuresis and recommended the patient see his cardiologist to ensure that this was achieved. Subsequently Dr. Drucilla Schmidt states this is a issue with venous stasis dermatitis which again I completely understand that that may definitely be the case but unfortunately we've not been able to get things under control even with maximum compression at a 4 layer compression wrap. The patient is still having significant issues. Either way I do think the patient is getting need to follow-up with his cardiologist to see what we can do Dr. Drucilla Schmidt did make mention that the patient had a flareup of infection that Keflex 500 mg 4 times a day or Augmentin twice a day would be good options for nonpurulent cellulitis. The patient was again supposed to have gone to the hospital on the weekend following Christmas after he got things settled with family as far as getting someone to take care of his wife. With that being said he tells me that when it came around to that time he just didn't think about it. When asked him about why the drainage from his leg and the issues he is having that and remind him he told me "well it really hasn't changed". 11/29/2019 on evaluation today patient presents for follow-up concerning his left lower extremity edema and weeping. He did see his cardiologist at my request  after I discussed with him the situation as well. This is Binnie Kand who is a family Designer, jewellery that he saw. Subsequently the patient was recommended to start metolazone 5 mg daily for the next 5 days. They also recommended that really he needed essentially bed rest with elevation of his legs over the next week. With that being said the patient states that he is unable to even get in bed due to the fact that he has back trouble and has to sleep in his recliner. He never really gets in the bed at all. This is probably part of the issue coupled with his heart not functioning quite as well as what we would like to see and then subsequently he also has the issue of being overweight which contributes to this as well. Overall were still trying to figure out how to best manage this to get his tweaking under control and prevent anything from worsening. Again Dr. Drucilla Schmidt really did not feel like there was any evidence of infectious processes going on at this  point. 12/06/2019 on evaluation today patient's leg actually appears to be doing better with regard to edema control I am actually pleased in this regard. He also seems to be improving as far as drainage is concerned from the leg this is much better than what it was in the past. Fortunately there is no signs of active infection which is good news and overall the patient states he is [name], [name]. ([id number]) actually happier with where things stand at this point. Objective Constitutional Well-nourished and well-hydrated in no acute distress. Vitals Time Taken: 8:55 AM, Height: 69 in, Weight: 295 lbs, BMI: 43.6, Temperature: 98.4 F, Pulse: 90 bpm, Respiratory Rate: 18 breaths/min, Blood Pressure: 142/59 mmHg. Respiratory normal breathing without difficulty. Psychiatric this patient is able to make decisions and demonstrates good insight into disease process. Alert and Oriented x 3. pleasant and cooperative. General Notes: Patient's  wound bed currently showed signs of good Epithelization at this time. Fortunately there is no signs of active infection which is excellent news. No fevers, chills, nausea, vomiting, or diarrhea. Overall I am very pleased with how things seem to be progressing. I do think he is getting need new compression stockings which I did recommend he go ahead and check into obtaining. Other Condition(s) Patient presents with Lymphedema located on the Bilateral Leg. 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 [name], [name] Post procedure Diagnosis Wound #: Same as Pre-Procedure Plan Wound Cleansing: Clean wound with Normal Saline. - in office Cleanse wound with mild soap and water - left leg Skin Barriers/Peri-Wound Care: Other: - Anti-bacterial spray Secondary Dressing: XtraSorb Dressing Change Frequency: Dressing is to be changed Monday and Thursday. - Nurse visit as needed Follow-up Appointments: Return Appointment in 1 week. Edema Control: 4-Layer Compression System - Left Lower Extremity. Other: - Unna to anchor wrap 1. My suggestion at this time is going to be that we go ahead and discontinue the alginate we will use Xtrasorb over the areas at this point. He is really not draining significantly and in fact the alginate was extremely stuck. Subsequently we will use Anasept antibacterial spray over the legs and then apply the Xtrasorb. 2. On the recommend as well that we continue the 4 layer compression wraps for the left lower extremity. I think this is appropriate and seems to be doing well. 3. Also get a suggest  that the patient needs to get new compression stockings we will give them measurements today for him to go ahead and look into this. He is in agreement with that plan. He can get these from [facility] that is where he is gotten his from before. We will see patient back for reevaluation in 1 week here in the clinic. If anything worsens or changes patient will contact our office for additional recommendations. Electronic Signature(s) Signed: 12/06/2019 10:32:09 AM By: Worthy Keeler PA-C Entered By: Worthy Keeler on 12/06/2019 10:32:08 Wiest, [name] -------------------------------------------------------------------------------- SuperBill Details Patient Name: [name]. Date of Service: 12/06/2019 Medical Record Number: [id number] Patient Account Number: [id number] Date of Birth/Sex: 09/15/1951 (69 y.o. 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 99991111 Essential (primary) hypertension I50.42 Chronic combined systolic (congestive) and diastolic (congestive) heart failure L02.416 Cutaneous abscess of left lower limb Facility Procedures CPT4 Code: [id number] Description: (Facility Use Only) [phone] - [place] LWR LT LEG Modifier: Quantity: 1 Physician Procedures CPT4 Code: [id number] Description: [id number] - WC PHYS LEVEL 3 - EST PT ICD-10 Diagnosis Description I89.0 Lymphedema, not elsewhere classified I87.2 Venous insufficiency (chronic) (peripheral) E11.628 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

## 2019-12-06 NOTE — Progress Notes (Signed)
ALANN, SLYMAN ([id number]) Visit Report for 12/06/2019 Arrival Information Details Patient Name: [name]. Date of Service: 12/06/2019 8:30 AM Medical Record Number: [id number] Patient Account Number: [id number] Date of Birth/Sex: 03-06-1951 (69 y.o. M) Treating RN: Army Melia Primary Care Arch Methot: Aura Dials Other Clinician: Referring Courtny Bennison: Aura Dials Treating Scott Vanderveer/Extender: Melburn Hake, HOYT Weeks in Treatment: 10 Visit Information History Since Last Visit Added or deleted any medications: No Patient Arrived: Ambulatory Any new allergies or adverse reactions: No Arrival Time: 08:53 Had a fall or experienced change in No Accompanied By: self activities of daily living that may affect Transfer Assistance: None risk of falls: Patient Identification Verified: Yes Signs or symptoms of abuse/neglect since last visito No Secondary Verification Process Yes Hospitalized since last visit: No Completed: Implantable device outside of the clinic excluding No Patient Requires Transmission-Based No cellular tissue based products placed in the center Precautions: since last visit: Patient Has Alerts: Yes Has Dressing in Place as Prescribed: Yes Patient Alerts: DM II Has Compression in Place as Prescribed: Yes ABI11/18/20 L 1.25 R Pain Present Now: Yes 1.19 Electronic Signature(s) Signed: 12/06/2019 11:15:06 AM By: Lorine Bears RCP, RRT, CHT Entered By: Becky Sax, Amado Nash on 12/06/2019 08:54:33 Goll, [name] ([id number]) -------------------------------------------------------------------------------- Compression Therapy Details Patient Name: [name]. Date of Service: 12/06/2019 8:30 AM Medical Record Number: [id number] Patient Account Number: [id number] Date of Birth/Sex: Jan 09, 1951 (69 y.o. M) Treating RN: Army Melia Primary Care Pura Picinich: Aura Dials Other Clinician: Referring Laketha Leopard: Aura Dials Treating  Patrich Heinze/Extender: STONE III, HOYT Weeks in Treatment: 10 Compression Therapy Performed for Wound Assessment: NonWound Condition Lymphedema - Bilateral Leg Performed By: Clinician Army Melia, RN Compression Type: Four Layer Post Procedure Diagnosis Same as Pre-procedure Electronic Signature(s) Signed: 12/06/2019 4:17:07 PM By: Army Melia Entered By: Army Melia on 12/06/2019 09:28:37 Stoudt, [name] ([id number]) -------------------------------------------------------------------------------- Encounter Discharge Information Details Patient Name: [name]. Date of Service: 12/06/2019 8:30 AM Medical Record Number: [id number] Patient Account Number: [id number] Date of Birth/Sex: 1951/04/07 (69 y.o. M) Treating RN: Army Melia Primary Care Lamona Eimer: Aura Dials Other Clinician: Referring Michelyn Scullin: Aura Dials Treating Patsey Pitstick/Extender: Melburn Hake, HOYT Weeks in Treatment: 10 Encounter Discharge Information Items Discharge Condition: Stable Ambulatory Status: Ambulatory Discharge Destination: Home Transportation: Private Auto Accompanied By: self Schedule Follow-up Appointment: Yes Clinical Summary of Care: Electronic Signature(s) Signed: 12/06/2019 4:17:07 PM By: Army Melia Entered By: Army Melia on 12/06/2019 09:31:06 Cashin, [name] ([id number]) -------------------------------------------------------------------------------- Lower Extremity Assessment Details Patient Name: [name]. Date of Service: 12/06/2019 8:30 AM Medical Record Number: [id number] Patient Account Number: [id number] Date of Birth/Sex: [birth year] (69 y.o. M) Treating RN: Cornell Barman Primary Care Leron Stoffers: Aura Dials Other Clinician: Referring Deacon Gadbois: Aura Dials Treating Demonta Wombles/Extender: Melburn Hake, HOYT Weeks in Treatment: 10 Edema Assessment Assessed: [Left: No] [Right: No] Edema: [Left: Ye] [Right: s] Calf Left: Right: Point of Measurement: 36 cm From Medial  Instep 49 cm cm Ankle Left: Right: Point of Measurement: 12 cm From Medial Instep 29 cm cm Vascular Assessment Pulses: Dorsalis Pedis Palpable: [Left:Yes] Electronic Signature(s) Signed: 12/06/2019 5:20:48 PM By: Gretta Cool, BSN, RN, CWS, Kim RN, BSN Entered By: Gretta Cool, BSN, RN, CWS, Kim on 12/06/2019 [place], [name] ([id number]) -------------------------------------------------------------------------------- Multi Wound Chart Details Patient Name: [name]. Date of Service: 12/06/2019 8:30 AM Medical Record Number: [id number] Patient Account Number: [id number] Date of Birth/Sex: 1951-09-11 (69 y.o. M) Treating RN: Cornell Barman Primary Care Curly Mackowski: Aura Dials Other Clinician: Referring Theone Bowell: Aura Dials Treating Anmol Fleck/Extender: Melburn Hake, HOYT  Weeks in Treatment: 10 Vital Signs Height(in): 69 Pulse(bpm): 90 Weight(lbs): 295 Blood Pressure(mmHg): 142/59 Body Mass Index(BMI): 44 Temperature(F): 98.4 Respiratory Rate 18 (breaths/min): Wound Assessments Treatment Notes Electronic Signature(s) Signed: 12/06/2019 4:17:07 PM By: Army Melia Entered By: Army Melia on 12/06/2019 09:25:55 Roza, [name] ([id number]) -------------------------------------------------------------------------------- [place] Details Patient Name: [name]. Date of Service: 12/06/2019 8:30 AM Medical Record Number: [id number] Patient Account Number: [id number] Date of Birth/Sex: [birth year] (69 y.o. M) Treating RN: Cornell Barman Primary Care Tamikia Chowning: Aura Dials Other Clinician: Referring Ramisa Duman: Aura Dials Treating Keion Neels/Extender: Melburn Hake, HOYT Weeks in Treatment: 10 Active Inactive Nutrition Nursing Diagnoses: Impaired glucose control: actual or potential Goals: Patient/caregiver verbalizes understanding of need to maintain therapeutic glucose control per primary care physician Date Initiated: 09/27/2019 Target Resolution  Date: 10/19/2019 Goal Status: Active Interventions: Assess HgA1c results as ordered upon admission and as needed Notes: Orientation to the Wound Care Program Nursing Diagnoses: Knowledge deficit related to the wound healing center program Goals: Patient/caregiver will verbalize understanding of the [place] Date Initiated: 09/27/2019 Target Resolution Date: 10/19/2019 Goal Status: Active Interventions: Provide education on orientation to the wound center Notes: Wound/Skin Impairment Nursing Diagnoses: Impaired tissue integrity Goals: Ulcer/skin breakdown will have a volume reduction of 30% by week 4 Date Initiated: 09/27/2019 Target Resolution Date: 10/25/2019 Goal Status: Active Interventions: Assess ulceration(s) every visit AFTON, KNICKERBOCKER ([id number]) Notes: Electronic Signature(s) Signed: 12/06/2019 4:17:07 PM By: Army Melia Signed: 12/06/2019 5:20:48 PM By: Gretta Cool, BSN, RN, CWS, Kim RN, BSN Entered By: Army Melia on 12/06/2019 09:25:39 Meador, [name] ([id number]) -------------------------------------------------------------------------------- Non-Wound Condition Assessment Details Patient Name: JERRELL, ASHMEAD. Date of Service: 12/06/2019 8:30 AM Medical Record Number: [id number] Patient Account Number: [id number] Date of Birth/Sex: August 26, 1951 (69 y.o. M) Treating RN: Cornell Barman Primary Care Olliver Boyadjian: Aura Dials Other Clinician: Referring Emmanuella Mirante: Aura Dials Treating Fady Stamps/Extender: Melburn Hake, HOYT Weeks in Treatment: 10 Non-Wound Condition: Condition: Lymphedema Location: Leg Side: Bilateral Photos Electronic Signature(s) Signed: 12/06/2019 5:20:48 PM By: Gretta Cool, BSN, RN, CWS, Kim RN, BSN Entered By: Gretta Cool, BSN, RN, CWS, Kim on 12/06/2019 09:02:41 [place]ier, [name] ([id number]) -------------------------------------------------------------------------------- Pain Assessment Details Patient Name: LENDEL, VALLETTA. Date of  Service: 12/06/2019 8:30 AM Medical Record Number: [id number] Patient Account Number: [id number] Date of Birth/Sex: 1951/04/13 (69 y.o. M) Treating RN: Army Melia Primary Care Tyliek Timberman: Aura Dials Other Clinician: Referring Ava Deguire: Aura Dials Treating Azir Muzyka/Extender: Melburn Hake, HOYT Weeks in Treatment: 10 Active Problems Location of Pain Severity and Description of Pain Patient Has Paino Yes Site Locations Rate the pain. Current Pain Level: 2 Pain Management and Medication Current Pain Management: Electronic Signature(s) Signed: 12/06/2019 11:15:06 AM By: Lorine Bears RCP, RRT, CHT Signed: 12/06/2019 4:17:07 PM By: Army Melia Entered By: Lorine Bears on 12/06/2019 08:54:46 Boyson, [name] ([id number]) -------------------------------------------------------------------------------- Patient/Caregiver Education Details Patient Name: [name]. Date of Service: 12/06/2019 8:30 AM Medical Record Number: [id number] Patient Account Number: [id number] Date of Birth/Gender: 1951/10/18 (69 y.o. M) Treating RN: Army Melia Primary Care Physician: Aura Dials Other Clinician: Referring Physician: Aura Dials Treating Physician/Extender: Sharalyn Ink in Treatment: 10 Education Assessment Education Provided To: Patient Education Topics Provided Wound/Skin Impairment: Handouts: Caring for Your Ulcer Methods: Demonstration, Explain/Verbal Responses: State content correctly Electronic Signature(s) Signed: 12/06/2019 4:17:07 PM By: Army Melia Entered By: Army Melia on 12/06/2019 09:29:50 Chaviano, [name] ([id number]) -------------------------------------------------------------------------------- Vitals Details Patient Name: [name]. Date of Service: 12/06/2019 8:30 AM Medical Record Number: [id number] Patient Account Number:  [id number] Date of Birth/Sex: 06/29/1951 (69 y.o. M) Treating RN: Army Melia Primary Care Ramondo Dietze: Aura Dials Other Clinician: Referring Jordanne Elsbury: Aura Dials Treating Javante Nilsson/Extender: Melburn Hake, HOYT Weeks in Treatment: 10 Vital Signs Time Taken: 08:55 Temperature (F): 98.4 Height (in): 69 Pulse (bpm): 90 Weight (lbs): 295 Respiratory Rate (breaths/min): 18 Body Mass Index (BMI): 43.6 Blood Pressure (mmHg): 142/59 Reference Range: 80 - 120 mg / dl Electronic Signature(s) Signed: 12/06/2019 11:15:06 AM By: Lorine Bears RCP, RRT, CHT Entered By: Becky Sax, Amado Nash on 12/06/2019 08:56:08

## 2019-12-07 ENCOUNTER — Ambulatory Visit: Payer: Medicare Other | Admitting: Cardiology

## 2019-12-07 NOTE — Progress Notes (Deleted)
Primary Physician/Referring:  Aura Dials, MD  Patient ID: Keith Reading., male    DOB: 1951/05/27, 69 y.o.   MRN: [id number]  No chief complaint on file.  HPI:    HPI: [name].  is a 69 y.o. Caucasian male with hypertension, LBBB, uncontrolled diabetes mellitus, morbid obesity, chronic leg edema probably due to venous insufficiency from morbid obesity with recent ulceration left leg and is now under management of wound care therapy. He has had little progression since starting treatment.  He has had negative venous insufficiency evaluation and ABI with vascular surgery. Ulceration is felt to be related to venous stasis dermatitis. I was contacted by Jeri Cos, PA at wound care center last week due to concern of poor responsiveness to treatment due to continued swelling. I had increased his torsemide to BID dosing and I had requested to see him in the office for further management.   Patient states that he is doing well.  He continues to have left lower extremity dressing in place, that he feels is improving as he is no longer having drainage.  He does have some mild pain to his left lower extremity.  He is to see wound care on Thursday.  He has diuresed some since being on increased dose of torsemide; however, his weight is still elevated.  He denies any chest pain or dyspnea on exertion; however, does not do much activity.  He is the sole caregiver for his wife who is blind.  He does sleep in a recliner, due to his hip pain, not due to shortness of breath with lying flat.  States that he is compliant with his diet.  Eats mostly protein.  Uses salt substitute.  Does admit to drinking occasional sodas or sweet tea during the day.    Past Medical History:  Diagnosis Date  . Arthritis   . Back pain    occasionally  . BPH (benign prostatic hypertrophy)    takes Uroxatral daily as well as Finasteride   . Diabetes mellitus without complication ([place])    takes Metformin daily   . Diabetic foot infection ([place]) 10/22/2019  . GERD (gastroesophageal reflux disease)    no meds  . Hepatitis hx of   at age 28  . Hypertension    takes Losartan and HCTZ daily  . Joint pain   . Lymphedema 10/22/2019  . Urinary frequency   . Urinary urgency   . Venous stasis dermatitis 11/19/2019   Past Surgical History:  Procedure Laterality Date  . APPENDECTOMY     age 47  . COLONOSCOPY    . KNEE ARTHROSCOPY  1984   right and left   . SHOULDER ARTHROSCOPY  2003   right RCR-GSC-stayed overnight-had SOB  . SHOULDER ARTHROSCOPY WITH ROTATOR CUFF REPAIR AND SUBACROMIAL DECOMPRESSION Left 03/13/2013   Procedure: LEFT SHOULDER ARTHROSCOPY WITH SUBACROMIAL DECOMPRESSION, DISTAL CLAVICLE RESECTION AND REPAIR ROTATOR CUFF AND LABRAL DEBRIDEMENT;  Surgeon: [name]., MD;  Location: [facility];  Service: Orthopedics;  Laterality: Left;  . TOTAL HIP ARTHROPLASTY Right 01/29/2015   Procedure: TOTAL HIP ARTHROPLASTY ANTERIOR APPROACH;  Surgeon: [name], MD;  Location: [place];  Service: Orthopedics;  Laterality: Right;  Marland Kitchen VASECTOMY    . [place]   Social History   Socioeconomic History  . Marital status: Married    Spouse name: Not on file  . Number of children: 3  . Years of education: Not on file  .  Highest education level: Not on file  Occupational History  . Not on file  Tobacco Use  . Smoking status: Never Smoker  . Smokeless tobacco: Former Network engineer and Sexual Activity  . Alcohol use: Yes    Alcohol/week: 0.0 standard drinks    Comment: occasionally beer or scotch  . Drug use: No  . Sexual activity: Yes  Other Topics Concern  . Not on file  Social History Narrative  . Not on file   Social Determinants of Health   Financial Resource Strain:   . Difficulty of Paying Living Expenses: Not on file  Food Insecurity:   . Worried About Charity fundraiser in the Last Year: Not on file  . Ran Out of Food in the Last Year: Not  on file  Transportation Needs:   . Lack of Transportation (Medical): Not on file  . Lack of Transportation (Non-Medical): Not on file  Physical Activity:   . Days of Exercise per Week: Not on file  . Minutes of Exercise per Session: Not on file  Stress:   . Feeling of Stress : Not on file  Social Connections:   . Frequency of Communication with Friends and Family: Not on file  . Frequency of Social Gatherings with Friends and Family: Not on file  . Attends Religious Services: Not on file  . Active Member of Clubs or Organizations: Not on file  . Attends Archivist Meetings: Not on file  . Marital Status: Not on file  Intimate Partner Violence:   . Fear of Current or Ex-Partner: Not on file  . Emotionally Abused: Not on file  . Physically Abused: Not on file  . Sexually Abused: Not on file   ROS  Review of Systems  Constitution: Negative for chills, decreased appetite, malaise/fatigue, weight gain and weight loss.  Cardiovascular: Positive for dyspnea on exertion (chronic and stable) and leg swelling. Negative for orthopnea (sleeps in a recliner), paroxysmal nocturnal dyspnea and syncope.  Endocrine: Negative for cold intolerance.  Hematologic/Lymphatic: Does not bruise/bleed easily.  Musculoskeletal: Positive for arthritis (hands) and joint pain (right hip at total arthroplasty site). Negative for joint swelling.  Gastrointestinal: Positive for diarrhea (chronic). Negative for abdominal pain, anorexia, change in bowel habit, hematochezia and melena.  Neurological: Negative for headaches and light-headedness.  Psychiatric/Behavioral: Negative for depression and substance abuse.  All other systems reviewed and are negative.  Objective  There were no vitals taken for this visit. There is no height or weight on file to calculate BMI.   Physical Exam  Constitutional: He appears well-developed. No distress.  Morbidly obese  HENT:  Head: Atraumatic.  Eyes: Conjunctivae  are normal.  Neck: No thyromegaly present.  Short neck and difficult to evaluate JVP  Cardiovascular: Normal rate, regular rhythm and normal heart sounds. Exam reveals no gallop.  No murmur heard. Pulses:      Carotid pulses are 2+ on the right side and 2+ on the left side.      Dorsalis pedis pulses are 2+ on the right side and 2+ on the left side.       Posterior tibial pulses are 2+ on the right side and 2+ on the left side.  Femoral pulse difficult to feel due to patient's body habitus. Bounding popliteal pulse bilateral.  Right pedal pulse normal in spite of edema. Left cannot be felt due to dressing from wound. Cannot make out JVD due to short neck.   Pulmonary/Chest: Effort normal and breath  sounds normal.  Abdominal: Soft. Bowel sounds are normal.  Obese. Pannus present  Musculoskeletal:        General: Normal range of motion.     Cervical back: Neck supple.  Neurological: He is alert.  Skin: Skin is warm and dry.  Psychiatric: He has a normal mood and affect.  Vitals reviewed.  Radiology: No results found.  Laboratory examination:    Labs 05/23/2019: Serum glucose 180 mg, BUN 11, creatinine 0.95, EGFR greater than 60 mL, potassium 4.0.  A1c 9.8%.  CMP Latest Ref Rng & Units 10/22/2019 07/20/2018 06/26/2018  Glucose 65 - 99 mg/dL 145(H) 203(H) 173(H)  BUN 7 - 25 mg/dL 28(H) 25 21  Creatinine 0.70 - 1.18 mg/dL 1.39(H) 1.29(H) 1.10  Sodium 135 - 146 mmol/L 142 142 141  Potassium 3.5 - 5.3 mmol/L 4.2 4.5 4.2  Chloride 98 - 110 mmol/L 106 97 103  CO2 20 - 32 mmol/L '28 28 23  '$ Calcium 8.6 - 10.3 mg/dL 7.9(L) 9.0 8.9  Total Protein 6.1 - 8.1 g/dL 6.8 - -  Total Bilirubin 0.2 - 1.2 mg/dL 0.5 - -  Alkaline Phos 39 - 117 U/L - - -  AST 10 - 35 U/L 26 - -  ALT 9 - 46 U/L 27 - -   CBC Latest Ref Rng & Units 10/22/2019 01/31/2015 01/30/2015  WBC 3.8 - 10.8 Thousand/uL 7.1 10.6(H) 9.9  Hemoglobin 13.2 - 17.1 g/dL 13.1(L) 10.5(L) 11.6(L)  Hematocrit 38.5 - 50.0 % 39.7 31.1(L) 34.3(L)   Platelets 140 - 400 Thousand/uL 203 161 204   Lipid Panel  No results found for: CHOL, TRIG, HDL, CHOLHDL, VLDL, LDLCALC, LDLDIRECT HEMOGLOBIN A1C No results found for: HGBA1C, MPG TSH No results for input(s): TSH in the last 8760 hours. Medications   Current Outpatient Medications  Medication Instructions  . Cholecalciferol (VITAMIN D3) 5000 UNITS TABS 1 tablet, Oral, Daily, Reported on 04/29/2016  . finasteride (PROSCAR) 5 mg, Oral, Daily  . GABAPENTIN PO Oral, As needed  . glipiZIDE (GLUCOTROL XL) 5 mg, Oral, 3 times daily with meals, 2am, 1 pm  . metolazone (ZAROXOLYN) 5 mg, Oral, Daily  . metoprolol succinate (TOPROL-XL) 50 MG 24 hr tablet TAKE 1 TABLET BY MOUTH  DAILY WITH OR IMMEDIATLEY  FOLLOWING A MEAL  . potassium chloride SA (KLOR-CON) 20 MEQ tablet Take 1 tablet by mouth daily  . torsemide (DEMADEX) 40 mg, Oral, Daily    Cardiac Studies:   Lexiscan Sestamibi Stress Test 06/13/2017 at [facility]:  The left ventricular ejection fraction is moderately decreased (30-44%).  Nuclear stress EF: 39%.  There was no ST segment deviation noted during stress.  Defect 1: There is a small defect of mild severity present in the mid anteroseptal and apical septal location.   Low risk stress nuclear study with LBBB-related septal perfusion artifact and moderately reduced left ventricular global systolic function.  Echocardiogram 08/15/2019: Left ventricle cavity is normal in size. Moderate concentric hypertrophy of the left ventricle. Mildly depressed LV systolic function with EF 50%. Normal global wall motion. Indeterminate diastolic filling pattern. Calculated EF 50%. Left atrial cavity is moderately dilated. No significant valvular abnormality.  Normal right atrial pressure.   Assessment   No diagnosis found.  EKG 09/12/2019: Normal sinus rhythm at rate of 90 bpm, left atrial abnormality, left bundle branch block.  No further analysis.   Recommendations:   I suspect his lower  extremity edema is related to venous stasis from sitting in his chair from long periods of time,  poor dietary compliance, and obesity.  He has been evaluated by vascular surgery, no significant venous insufficiency.  ABI was essentially normal.  He has also been evaluated by infectious disease who also felt that his left leg ulceration was related to venous stasis dermatitis.  LVEF is only mildly depressed at 50%.  I suspect his diastolic dysfunction is related to his obesity.  I will aggressively diurese.  He has not had significant response with increased dose of torsemide.  We will add metolazone 5 mg daily for the next 5 days.  Ideally I would have the patient to be on essentially bedrest with his legs elevated for the next 1 week; however, he states that this is not possible as he is not comfortable in the bed.  He will try to elevate his legs further in the recliner.  I have advised him to mostly stay sitting in his chair with legs elevated and to avoid being on his feet for long periods of time.  This is difficult as he is the sole caregiver for his wife who is blind, in which I have asked him to return to neighbors to help him care for her during the next 1 week.  We have extensively discussed dietary changes to also help with controlling his leg edema and help with weight loss.  I will check his BMP and BNP in 1 week before his next office visit.    Keith Dunn, MSN, APRN, FNP-C [facility]. [place] Office: [phone] Fax: [phone]

## 2019-12-13 ENCOUNTER — Encounter: Payer: Medicare Other | Admitting: Physician Assistant

## 2019-12-13 ENCOUNTER — Other Ambulatory Visit: Payer: Self-pay

## 2019-12-13 DIAGNOSIS — I890 Lymphedema, not elsewhere classified: Secondary | ICD-10-CM | POA: Diagnosis not present

## 2019-12-13 NOTE — Progress Notes (Addendum)
GALDINO, TWISDALE ([id number]) Visit Report for 12/13/2019 Chief Complaint Document Details Patient Name: [name], [name]. Date of Service: 12/13/2019 8:30 AM Medical Record Number: [id number] Patient Account Number: [id number] Date of Birth/Sex: 1951-08-31 (69 y.o. M) Treating RN: Army Melia Primary Care Provider: Aura Dials Other Clinician: Referring Provider: Aura Dials Treating Provider/Extender: Melburn Hake, Pluma Diniz Weeks in Treatment: 11 Information Obtained from: Patient Chief Complaint Bilateral LE lymphedema left leg cellulitis Electronic Signature(s) Signed: 12/13/2019 8:54:03 AM By: Worthy Keeler PA-C Entered By: Worthy Keeler on 12/13/2019 08:54:02 Laplant, [name] ([id number]) -------------------------------------------------------------------------------- HPI Details Patient Name: [name]. Date of Service: 12/13/2019 8:30 AM Medical Record Number: [id number] Patient Account Number: [id number] Date of Birth/Sex: 01-Nov-1951 (69 y.o. M) Treating RN: Army Melia Primary Care Provider: Aura Dials Other Clinician: Referring Provider: Aura Dials Treating Provider/Extender: Melburn Hake, Keshun Berrett Weeks in Treatment: 11 History of Present Illness HPI Description: 09/27/2019 on evaluation today patient appears to be doing poorly in regard to his bilateral lower extremities he does have bilateral lower extremity lymphedema. With that being said he also has a history of diabetes type 2, hypertension, congestive heart failure, and apparently has chronic venous insufficiency which has led to the lymphedema. Currently he also shows signs of an infection of the left lower extremity which does have me concerned at this point as well. He tells me that he is not really having any significant pain but has had a lot of trouble with his legs in general. He does have a compression stocking he is wearing on the right lower extremity he was wrapped with an Unna boot on the left  lower extremity. The patient tells me that overall he has been doing with this for several weeks and home health has been initiated. He has not been on any antibiotics yet and he tells me that the home health nurse was not sure that it was infected based on what I see today I am concerned that this likely is infected at this point. Fortunately however there does not appear to be any evidence of systemic infection which is good news. The Unna boot does seem to be helping control the edema although he may benefit from a stronger compression wrap I think that at this point without having the ABIs completed the Unna boot is where I would stand. He is supposed to be having an arterial study with ABI as ordered by his cardiologist but he states he may have missed that appointment and has not called to reschedule he needs to contact them to get this rescheduled as soon as possible I explained to him. He tells me that he is draining a lot as far as the left lower extremity is concerned and that is something that he feels like is not lasting very long as far as the wraps are concerned when they put him on they seem to get wet extremely quickly. 10/04/2019 on evaluation today patient appears to be doing better compared to last week's evaluation. Fortunately there is no signs of active infection at this time. He has been tolerating the dressing changes without complication. He did undergo arterial studies yesterday and has an appointment tomorrow with the vascular specialist. With that being said his ABI on the left was 1.25 on the right 1.19 with a TBI of 0.61 on the left and 0.69 on the right. There was stated to be no evidence of obvious significant arterial disease and the patient had pretty much triphasic blood flow throughout. Overall  I am very pleased with how things seem to be progressing. We will see if the vascular doctor says anything different tomorrow but I even at this point I feel like he would  do well with a stronger compression wrap to try to get some the fluid out of his leg at this point. The good news is his drainage has slowed down considerably I do believe the antibiotics have been beneficial for him. 10/18/2019 on evaluation today patient appears to be doing poorly in regard to his left lower extremity. He still having a lot of drainage and weeping from his leg this appears to be much more erythematous and in fact I am concerned that the erythema may be spreading up his leg as well. He has not experienced any fevers, chills, nausea, vomiting, or diarrhea up to this point. I explained to him that I really feel like based on what I am seeing he may need to potentially go to the hospital for evaluation and treatment potentially even with IV antibiotics depending on what they see. With that being said he tells me that he is not really able to do that due to the fact that he is the primary caregiver for his wife who is legally blind. He states he is not good to be able to have anything to help as far as caring for her if he were to go into the hospital. Nonetheless he is unsure what to do in this regard. I really think that he needs faster treatment. For that reason I am going to likely try to get in touch with the infectious disease office to see if I can get him in sooner than [place]. 10/25/2019 on evaluation today patient appears to be doing a little better in regard to his weeping and edema compared to the last time I saw him. The dorsal surface of his foot is a little bit drier compared to what was this is good news. He did see Dr. Drucilla Schmidt on 10/22/2019. Dr. Drucilla Schmidt did feel that potentially this could be secondary to some cellulitis and he recommended subsequently placing the patient on Zyvox which the patient feels like has helped as well. There does not appear to be any signs of active infection at this time which is good news systemically. I am very pleased that Dr. Drucilla Schmidt was  able to see him so quickly I do appreciate this greatly. With that being said he also did recommend an MRI for the patient to evaluate for any deeper signs of infection. That is actually scheduled for this coming Tuesday. He also has a follow-up appoint with Dr. Drucilla Schmidt on Tuesday. The last thing Dr. Drucilla Schmidt did mention was the possibility of more aggressive diuresis. The patient is on torsemide. With that being said this is managed by his cardiologist. I think we may want to get in touch with her clinic and see if possibly this is something that could be increased for a short amount of time depending on their recommendations to see if we can get some additional fluid off of him which in turn would likely help tremendously with his MARQ, MILIC. ([id number]) weeping and edema. He is in agreement with this plan if his cardiologist is in agreement. 11/01/2019 on evaluation today patient unfortunately notes that he did not get his MRI done which was ordered by Dr. Drucilla Schmidt. Apparently when he went in it was 6:30 in the morning and they told him that he was there to get an MRI of  his toes. With that being said the MRI was ordered of his ankle and foot and so long story short the patient said that he was not getting MRI of his toes and left. Dr. Drucilla Schmidt was alerted and again it does not appear that at all that was what he ordered that he did order the ankle and foot but nonetheless that is beside the point at this time. The patient still has considerable edema noted at this time the nurse that came out last after just put a Kerlix and Coban wrap on the patient she did not even properly wrap him. Subsequently they have also discharged him being that they state he is not skilled at this point. Therefore he is going to have to be seen here in the office only. Fortunately there is no signs of systemic infection he still has a lot of erythema around the ankle and lower extremity region again there are any  specific open wounds just general weeping and evidence of erythema which Dr. Drucilla Schmidt is questioning whether or not this is even infection or if it is just more secondary to the patient's lymphedema. He has had venous reflux studies which were negative for any abnormal findings unfortunately that is also not good to be an answer for [place] here. I have recommended the patient today contact his primary care provider to see if they can increase his fluid pills and I also think we need to increase his compression wrap to a 4-layer compression wrap to try to more effectively manage his edema. 11/08/2019 on evaluation today patient appears unfortunately to still be doing poorly. I did speak with his primary care provider office last week they were supposed to call him to advise what should be done as far as his fluid pills were concerned. With that being said they did not contact him as far as the patient tells me. I am not really sure exactly what is going on as far as that is concerned. Nonetheless I really think that the patient needs to be admitted to the hospital for treatment likely as I explained to him he is going to require IV Lasix to get fluid off and to be honest also think he may need IV antibiotics. I have made all the outpatient referrals I can to try to get things under control he has been on oral antibiotics I really just have not seen any significant improvement he was even on linezolid. That was prescribed By infectious disease. Overall I am very concerned about the fact that this is not getting better. 11/22/2019 on evaluation today patient unfortunately is not doing very well with regard to his lower extremity edema. The left lower extremity is significantly swollen and still significantly erythematous. He has bright green drainage. He did just see Dr. Drucilla Schmidt on 11/19/2019 and Dr. Drucilla Schmidt did not feel like that this was infected. Subsequently he states that he feels like the issue is more 1 of  not being at maximal diuresis and recommended the patient see his cardiologist to ensure that this was achieved. Subsequently Dr. Drucilla Schmidt states this is a issue with venous stasis dermatitis which again I completely understand that that may definitely be the case but unfortunately we've not been able to get things under control even with maximum compression at a 4 layer compression wrap. The patient is still having significant issues. Either way I do think the patient is getting need to follow-up with his cardiologist to see what we can do Dr. Drucilla Schmidt  did make mention that the patient had a flareup of infection that Keflex 500 mg 4 times a day or Augmentin twice a day would be good options for nonpurulent cellulitis. The patient was again supposed to have gone to the hospital on the weekend following Christmas after he got things settled with family as far as getting someone to take care of his wife. With that being said he tells me that when it came around to that time he just didn't think about it. When asked him about why the drainage from his leg and the issues he is having that and remind him he told me "well it really hasn't changed". 11/29/2019 on evaluation today patient presents for follow-up concerning his left lower extremity edema and weeping. He did see his cardiologist at my request after I discussed with him the situation as well. This is Binnie Kand who is a family Designer, jewellery that he saw. Subsequently the patient was recommended to start metolazone 5 mg daily for the next 5 days. They also recommended that really he needed essentially bed rest with elevation of his legs over the next week. With that being said the patient states that he is unable to even get in bed due to the fact that he has back trouble and has to sleep in his recliner. He never really gets in the bed at all. This is probably part of the issue coupled with his heart not functioning quite as well as what we  would like to see and then subsequently he also has the issue of being overweight which contributes to this as well. Overall were still trying to figure out how to best manage this to get his tweaking under control and prevent anything from worsening. Again Dr. Drucilla Schmidt really did not feel like there was any evidence of infectious processes going on at this point. 12/06/2019 on evaluation today patient's leg actually appears to be doing better with regard to edema control I am actually pleased in this regard. He also seems to be improving as far as drainage is concerned from the leg this is much better than what it was in the past. Fortunately there is no signs of active infection which is good news and overall the patient states he is actually happier with where things stand at this point. 12/13/2019 on evaluation today patient unfortunately is very dry with cracked skin noted at this point. Fortunately there is no signs of active infection at this time. With that being said I think he is really needs some moisturizing lotion something really good to counter loosen up the dry cracked skin I think even if 40% urea cream would be ideal at this point. I discussed this TRENELL, PARROW. ([id number]) with him today. In fact even look this up on his phone so that he can order it if he needs to although he may be able to find it locally I am just not sure. I told him to check with his pharmacy first. Electronic Signature(s) Signed: 12/13/2019 9:11:00 AM By: Worthy Keeler PA-C Entered By: Worthy Keeler on 12/13/2019 09:10:59 Azizi, [name] ([id number]) -------------------------------------------------------------------------------- Physical Exam Details Patient Name: [name]. Date of Service: 12/13/2019 8:30 AM Medical Record Number: [id number] Patient Account Number: [id number] Date of Birth/Sex: Feb 18, 1951 (69 y.o. M) Treating RN: Army Melia Primary Care Provider: Aura Dials Other  Clinician: Referring Provider: Aura Dials Treating Provider/Extender: STONE III, Toney Difatta Weeks in Treatment: 11 Constitutional Obese and well-hydrated in no acute distress.  Respiratory normal breathing without difficulty. Psychiatric this patient is able to make decisions and demonstrates good insight into disease process. Alert and Oriented x 3. pleasant and cooperative. Notes Patient's wounds currently again really there is nothing open he just has dry cracked skin he really needs some good moisturizer and the ability to take a good shower in my opinion. I think at this point that we probably should go ahead and discontinue the compression wrap based on what I am seeing and I would recommend that he get a shower this evening then get out of the shower and put on a good moisturizing cream if he can find the 40% urea that would be best if not then to what he can until he can get that. Electronic Signature(s) Signed: 12/13/2019 9:11:32 AM By: Worthy Keeler PA-C Entered By: Worthy Keeler on 12/13/2019 09:11:31 Gaspard, [name] ([id number]) -------------------------------------------------------------------------------- Physician Orders Details Patient Name: [name]. Date of Service: 12/13/2019 8:30 AM Medical Record Number: [id number] Patient Account Number: [id number] Date of Birth/Sex: 1950-12-20 (69 y.o. M) Treating RN: Army Melia Primary Care Provider: Aura Dials Other Clinician: Referring Provider: Aura Dials Treating Provider/Extender: Melburn Hake, Cleon Thoma Weeks in Treatment: 52 Verbal / Phone Orders: No Diagnosis Coding ICD-10 Coding Code Description I89.0 Lymphedema, not elsewhere classified I87.2 Venous insufficiency (chronic) (peripheral) E11.628 Type 2 diabetes mellitus with other skin complications [id number] Essential (primary) hypertension I50.42 Chronic combined systolic (congestive) and diastolic (congestive) heart failure L02.416 Cutaneous abscess of  left lower limb Wound Cleansing o Clean wound with Normal Saline. - in office o Cleanse wound with mild soap and water - left leg Skin Barriers/Peri-Wound Care o Moisturizing lotion Follow-up Appointments o Return Appointment in 1 week. Edema Control o Elevate legs to the level of the heart and pump ankles as often as possible o Other: - double layer tubi grip f Electronic Signature(s) Signed: 12/13/2019 11:58:35 AM By: Army Melia Signed: 12/13/2019 5:03:37 PM By: Worthy Keeler PA-C Entered By: Army Melia on 12/13/2019 09:01:28 Schlechter, [name] ([id number]) -------------------------------------------------------------------------------- Problem List Details Patient Name: HAWK, TAUFA. Date of Service: 12/13/2019 8:30 AM Medical Record Number: [id number] Patient Account Number: [id number] Date of Birth/Sex: 04/10/1951 (69 y.o. M) Treating RN: Army Melia Primary Care Provider: Aura Dials Other Clinician: Referring Provider: Aura Dials Treating Provider/Extender: Melburn Hake, Romina Divirgilio Weeks in Treatment: 11 Active Problems ICD-10 Evaluated Encounter Code Description Active Date Today Diagnosis I89.0 Lymphedema, not elsewhere classified 09/27/2019 No Yes I87.2 Venous insufficiency (chronic) (peripheral) 09/27/2019 No Yes E11.628 Type 2 diabetes mellitus with other skin complications 123XX123 No Yes I10 Essential (primary) hypertension 09/27/2019 No Yes I50.42 Chronic combined systolic (congestive) and diastolic 123XX123 No Yes (congestive) heart failure L02.416 Cutaneous abscess of left lower limb 09/27/2019 No Yes Inactive Problems Resolved Problems Electronic Signature(s) Signed: 12/13/2019 8:53:54 AM By: Worthy Keeler PA-C Entered By: Worthy Keeler on 12/13/2019 08:53:53 Slattery, [name] ([id number]) -------------------------------------------------------------------------------- Progress Note Details Patient Name: [name]. Date of  Service: 12/13/2019 8:30 AM Medical Record Number: [id number] Patient Account Number: [id number] Date of Birth/Sex: 1951-02-02 (69 y.o. M) Treating RN: Army Melia Primary Care Provider: Aura Dials Other Clinician: Referring Provider: Aura Dials Treating Provider/Extender: Melburn Hake, Finn Amos Weeks in Treatment: 11 Subjective Chief Complaint Information obtained from Patient Bilateral LE lymphedema left leg cellulitis History of Present Illness (HPI) 09/27/2019 on evaluation today patient appears to be doing poorly in regard to his bilateral lower extremities he does have bilateral lower extremity lymphedema. With that being  said he also has a history of diabetes type 2, hypertension, congestive heart failure, and apparently has chronic venous insufficiency which has led to the lymphedema. Currently he also shows signs of an infection of the left lower extremity which does have me concerned at this point as well. He tells me that he is not really having any significant pain but has had a lot of trouble with his legs in general. He does have a compression stocking he is wearing on the right lower extremity he was wrapped with an Unna boot on the left lower extremity. The patient tells me that overall he has been doing with this for several weeks and home health has been initiated. He has not been on any antibiotics yet and he tells me that the home health nurse was not sure that it was infected based on what I see today I am concerned that this likely is infected at this point. Fortunately however there does not appear to be any evidence of systemic infection which is good news. The Unna boot does seem to be helping control the edema although he may benefit from a stronger compression wrap I think that at this point without having the ABIs completed the Unna boot is where I would stand. He is supposed to be having an arterial study with ABI as ordered by his cardiologist but he states he  may have missed that appointment and has not called to reschedule he needs to contact them to get this rescheduled as soon as possible I explained to him. He tells me that he is draining a lot as far as the left lower extremity is concerned and that is something that he feels like is not lasting very long as far as the wraps are concerned when they put him on they seem to get wet extremely quickly. 10/04/2019 on evaluation today patient appears to be doing better compared to last week's evaluation. Fortunately there is no signs of active infection at this time. He has been tolerating the dressing changes without complication. He did undergo arterial studies yesterday and has an appointment tomorrow with the vascular specialist. With that being said his ABI on the left was 1.25 on the right 1.19 with a TBI of 0.61 on the left and 0.69 on the right. There was stated to be no evidence of obvious significant arterial disease and the patient had pretty much triphasic blood flow throughout. Overall I am very pleased with how things seem to be progressing. We will see if the vascular doctor says anything different tomorrow but I even at this point I feel like he would do well with a stronger compression wrap to try to get some the fluid out of his leg at this point. The good news is his drainage has slowed down considerably I do believe the antibiotics have been beneficial for him. 10/18/2019 on evaluation today patient appears to be doing poorly in regard to his left lower extremity. He still having a lot of drainage and weeping from his leg this appears to be much more erythematous and in fact I am concerned that the erythema may be spreading up his leg as well. He has not experienced any fevers, chills, nausea, vomiting, or diarrhea up to this point. I explained to him that I really feel like based on what I am seeing he may need to potentially go to the hospital for evaluation and treatment potentially  even with IV antibiotics depending on what they see. With that  being said he tells me that he is not really able to do that due to the fact that he is the primary caregiver for his wife who is legally blind. He states he is not good to be able to have anything to help as far as caring for her if he were to go into the hospital. Nonetheless he is unsure what to do in this regard. I really think that he needs faster treatment. For that reason I am going to likely try to get in touch with the infectious disease office to see if I can get him in sooner than [place]. 10/25/2019 on evaluation today patient appears to be doing a little better in regard to his weeping and edema compared to the last time I saw him. The dorsal surface of his foot is a little bit drier compared to what was this is good news. He did see Dr. Drucilla Schmidt on 10/22/2019. Dr. Drucilla Schmidt did feel that potentially this could be secondary to some cellulitis and he recommended subsequently placing the patient on Zyvox which the patient feels like has helped as well. There does not appear to be any signs of active infection at this time which is good news systemically. I am very pleased that Dr. Luther Parody, [facility] M. ([id number]) was able to see him so quickly I do appreciate this greatly. With that being said he also did recommend an MRI for the patient to evaluate for any deeper signs of infection. That is actually scheduled for this coming Tuesday. He also has a follow-up appoint with Dr. Drucilla Schmidt on Tuesday. The last thing Dr. Drucilla Schmidt did mention was the possibility of more aggressive diuresis. The patient is on torsemide. With that being said this is managed by his cardiologist. I think we may want to get in touch with her clinic and see if possibly this is something that could be increased for a short amount of time depending on their recommendations to see if we can get some additional fluid off of him which in turn would likely help  tremendously with his weeping and edema. He is in agreement with this plan if his cardiologist is in agreement. 11/01/2019 on evaluation today patient unfortunately notes that he did not get his MRI done which was ordered by Dr. Drucilla Schmidt. Apparently when he went in it was 6:30 in the morning and they told him that he was there to get an MRI of his toes. With that being said the MRI was ordered of his ankle and foot and so long story short the patient said that he was not getting MRI of his toes and left. Dr. Drucilla Schmidt was alerted and again it does not appear that at all that was what he ordered that he did order the ankle and foot but nonetheless that is beside the point at this time. The patient still has considerable edema noted at this time the nurse that came out last after just put a Kerlix and Coban wrap on the patient she did not even properly wrap him. Subsequently they have also discharged him being that they state he is not skilled at this point. Therefore he is going to have to be seen here in the office only. Fortunately there is no signs of systemic infection he still has a lot of erythema around the ankle and lower extremity region again there are any specific open wounds just general weeping and evidence of erythema which Dr. Drucilla Schmidt is questioning whether or not this is even infection  or if it is just more secondary to the patient's lymphedema. He has had venous reflux studies which were negative for any abnormal findings unfortunately that is also not good to be an answer for [place] here. I have recommended the patient today contact his primary care provider to see if they can increase his fluid pills and I also think we need to increase his compression wrap to a 4-layer compression wrap to try to more effectively manage his edema. 11/08/2019 on evaluation today patient appears unfortunately to still be doing poorly. I did speak with his primary care provider office last week they were  supposed to call him to advise what should be done as far as his fluid pills were concerned. With that being said they did not contact him as far as the patient tells me. I am not really sure exactly what is going on as far as that is concerned. Nonetheless I really think that the patient needs to be admitted to the hospital for treatment likely as I explained to him he is going to require IV Lasix to get fluid off and to be honest also think he may need IV antibiotics. I have made all the outpatient referrals I can to try to get things under control he has been on oral antibiotics I really just have not seen any significant improvement he was even on linezolid. That was prescribed By infectious disease. Overall I am very concerned about the fact that this is not getting better. 11/22/2019 on evaluation today patient unfortunately is not doing very well with regard to his lower extremity edema. The left lower extremity is significantly swollen and still significantly erythematous. He has bright green drainage. He did just see Dr. Drucilla Schmidt on 11/19/2019 and Dr. Drucilla Schmidt did not feel like that this was infected. Subsequently he states that he feels like the issue is more 1 of not being at maximal diuresis and recommended the patient see his cardiologist to ensure that this was achieved. Subsequently Dr. Drucilla Schmidt states this is a issue with venous stasis dermatitis which again I completely understand that that may definitely be the case but unfortunately we've not been able to get things under control even with maximum compression at a 4 layer compression wrap. The patient is still having significant issues. Either way I do think the patient is getting need to follow-up with his cardiologist to see what we can do Dr. Drucilla Schmidt did make mention that the patient had a flareup of infection that Keflex 500 mg 4 times a day or Augmentin twice a day would be good options for nonpurulent cellulitis. The patient was again  supposed to have gone to the hospital on the weekend following Christmas after he got things settled with family as far as getting someone to take care of his wife. With that being said he tells me that when it came around to that time he just didn't think about it. When asked him about why the drainage from his leg and the issues he is having that and remind him he told me "well it really hasn't changed". 11/29/2019 on evaluation today patient presents for follow-up concerning his left lower extremity edema and weeping. He did see his cardiologist at my request after I discussed with him the situation as well. This is Binnie Kand who is a family Designer, jewellery that he saw. Subsequently the patient was recommended to start metolazone 5 mg daily for the next 5 days. They also recommended that really he needed  essentially bed rest with elevation of his legs over the next week. With that being said the patient states that he is unable to even get in bed due to the fact that he has back trouble and has to sleep in his recliner. He never really gets in the bed at all. This is probably part of the issue coupled with his heart not functioning quite as well as what we would like to see and then subsequently he also has the issue of being overweight which contributes to this as well. Overall were still trying to figure out how to best manage this to get his tweaking under control and prevent anything from worsening. Again Dr. Drucilla Schmidt really did not feel like there was any evidence of infectious processes going on at this point. 12/06/2019 on evaluation today patient's leg actually appears to be doing better with regard to edema control I am actually pleased in this regard. He also seems to be improving as far as drainage is concerned from the leg this is much better than what it was in the past. Fortunately there is no signs of active infection which is good news and overall the patient states he  is OSMIN, AKRIDGE. ([id number]) actually happier with where things stand at this point. 12/13/2019 on evaluation today patient unfortunately is very dry with cracked skin noted at this point. Fortunately there is no signs of active infection at this time. With that being said I think he is really needs some moisturizing lotion something really good to counter loosen up the dry cracked skin I think even if 40% urea cream would be ideal at this point. I discussed this with him today. In fact even look this up on his phone so that he can order it if he needs to although he may be able to find it locally I am just not sure. I told him to check with his pharmacy first. Objective Constitutional Obese and well-hydrated in no acute distress. Vitals Time Taken: 8:36 AM, Height: 69 in, Weight: 295 lbs, BMI: 43.6, Temperature: 98.9 F, Pulse: 70 bpm, Respiratory Rate: 16 breaths/min, Blood Pressure: 144/52 mmHg. Respiratory normal breathing without difficulty. Psychiatric this patient is able to make decisions and demonstrates good insight into disease process. Alert and Oriented x 3. pleasant and cooperative. General Notes: Patient's wounds currently again really there is nothing open he just has dry cracked skin he really needs some good moisturizer and the ability to take a good shower in my opinion. I think at this point that we probably should go ahead and discontinue the compression wrap based on what I am seeing and I would recommend that he get a shower this evening then get out of the shower and put on a good moisturizing cream if he can find the 40% urea that would be best if not then to what he can until he can get that. 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 ([id number]) Plan Wound Cleansing: Clean wound with Normal Saline. - in office Cleanse wound with mild soap and water - left leg Skin Barriers/Peri-Wound Care: Moisturizing lotion Follow-up Appointments: Return Appointment in 1 week. Edema Control: Elevate legs to the level of the heart and pump ankles as often as possible Other: - double layer tubi grip f 1. I  recommended currently discontinuing the compression wrap will use Tubigrip today along with lotion to try to moisten up the dry cracked skin. I think this is good to be the best thing for him at this time. 2. Only to suggest as well that the patient see about getting 40% urea cream I think this would be ideal as far as helping things to loosen up. He is in agreement with that plan I did look this up on [place] for him he can order that when he gets of the car. Otherwise if he would like he can look at to see locally and see if they have it in stock that would be ideal since that he go ahead and start using it. 3. I do think he needs to just take a normal shower this evening to wash his legs I told him not to pick at the skin or scrub too hard but I think just a light cleaning over the area with doing a lot of good coupled with using the urea cream I think this will do great. we will see patient back for reevaluation in 1 week here in the clinic. If anything worsens or changes patient will contact our office for additional recommendations. Electronic Signature(s) Signed: 12/13/2019 9:12:46 AM By: Worthy Keeler PA-C Entered By: Worthy Keeler on 12/13/2019 09:12:46 Cassidy, [name] ([id number]) -------------------------------------------------------------------------------- SuperBill Details Patient Name: [name]. Date of Service: 12/13/2019 Medical Record Number: [id number] Patient Account Number: [id number] Date of Birth/Sex: 09/11/1951 (70 y.o.  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 99991111 Essential (primary) hypertension I50.42 Chronic combined systolic (congestive) and diastolic (congestive) heart failure L02.416 Cutaneous abscess of left lower limb Facility Procedures CPT4 Code: [id number] Description: 99213 - WOUND CARE VISIT-LEV 3 EST PT Modifier: Quantity: 1 Physician Procedures CPT4 Code: [id number] Description: 99213 - WC PHYS LEVEL 3 - EST PT ICD-10 Diagnosis Description I89.0 Lymphedema, not elsewhere classified I87.2 Venous insufficiency (chronic) (peripheral) E11.628 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

## 2019-12-20 ENCOUNTER — Encounter: Payer: Medicare Other | Attending: Physician Assistant | Admitting: Physician Assistant

## 2019-12-20 ENCOUNTER — Other Ambulatory Visit: Payer: Self-pay

## 2019-12-20 DIAGNOSIS — I110 Hypertensive heart disease with heart failure: Secondary | ICD-10-CM | POA: Diagnosis not present

## 2019-12-20 DIAGNOSIS — I872 Venous insufficiency (chronic) (peripheral): Secondary | ICD-10-CM | POA: Diagnosis not present

## 2019-12-20 DIAGNOSIS — I5042 Chronic combined systolic (congestive) and diastolic (congestive) heart failure: Secondary | ICD-10-CM | POA: Insufficient documentation

## 2019-12-20 DIAGNOSIS — E11628 Type 2 diabetes mellitus with other skin complications: Secondary | ICD-10-CM | POA: Insufficient documentation

## 2019-12-20 DIAGNOSIS — I890 Lymphedema, not elsewhere classified: Secondary | ICD-10-CM | POA: Diagnosis not present

## 2019-12-20 NOTE — Progress Notes (Signed)
[name] Visit Report for 12/20/2019 Arrival Information Details Patient Name: [name], [name]. Date of Service: 12/20/2019 8:30 AM Medical Record Number: [id number] Patient Account Number: [id number] Date of Birth/Sex: [birth year] (69 y.o. M) Treating RN: [name] Primary Care [name]: [name] Other Clinician: Referring [name]: [name] Treating [name]/Extender: [name], [name] in Treatment: 12 Visit Information History Since Last Visit Added or deleted any medications: No Patient Arrived: Ambulatory Any new allergies or adverse reactions: No Arrival Time: 08:47 Had a fall or experienced change in No Accompanied By: self activities of daily living that may affect Transfer Assistance: None risk of falls: Patient Identification Verified: Yes Signs or symptoms of abuse/neglect since last visito No Secondary Verification Process Yes Hospitalized since last visit: No Completed: Implantable device outside of the clinic excluding No Patient Requires Transmission-Based No cellular tissue based products placed in the center Precautions: since last visit: Patient Has Alerts: Yes Has Dressing in Place as Prescribed: Yes Patient Alerts: DM II Pain Present Now: Yes ABI11/18/20 L 1.25 R 1.19 Electronic Signature(s) Signed: 12/20/2019 11:49:35 AM By: [name] Entered By: [name], Amado Nash on 12/20/2019 08:49:32 Harmening, [name] -------------------------------------------------------------------------------- Compression Therapy Details Patient Name: [name]. Date of Service: 12/20/2019 8:30 AM Medical Record Number: [id number] Patient Account Number: [id number] Date of Birth/Sex: 1951-09-11 (69 y.o. M) Treating RN: [name] Primary Care [name]: [name] Other Clinician: Referring [name]: [name] Treating [name]/Extender: [name], [name] in  Treatment: 12 Compression Therapy Performed for Wound Assessment: NonWound Condition Lymphedema - Bilateral Leg Performed By: Clinician Keith Melia, RN Compression Type: Four Layer Pre Treatment ABI: 1.2 Post Procedure Diagnosis Same as Pre-procedure Electronic Signature(s) Signed: 12/20/2019 11:39:54 AM By: [name] Entered By: [name] on 12/20/2019 09:23:15 [place]hart, [name] -------------------------------------------------------------------------------- Encounter Discharge Information Details Patient Name: [name], [name]. Date of Service: 12/20/2019 8:30 AM Medical Record Number: [id number] Patient Account Number: [id number] Date of Birth/Sex: 19-Jan-1951 (69 y.o. M) Treating RN: [name] Primary Care [name]: [name] Other Clinician: Referring [name]: [name] Treating [name]/Extender: [name], [name] in Treatment: 12 Encounter Discharge Information Items Discharge Condition: Stable Ambulatory Status: Ambulatory Discharge Destination: Home Transportation: Private Auto Accompanied By: self Schedule Follow-up Appointment: Yes Clinical Summary of Care: Electronic Signature(s) Signed: 12/20/2019 11:39:54 AM By: [name] Entered By: [name] on 12/20/2019 09:25:07 [name] -------------------------------------------------------------------------------- Lower Extremity Assessment Details Patient Name: [name]. Date of Service: 12/20/2019 8:30 AM Medical Record Number: [id number] Patient Account Number: [id number] Date of Birth/Sex: [birth year] (69 y.o. M) Treating RN: [name] Primary Care Myah Guynes: [name] Other Clinician: Referring [name]: [name] Treating [name]/Extender: [name], [name] in Treatment: 12 Edema Assessment Assessed: [Left: No] [Right: No] Edema: [Left: Yes] [Right: Yes] Calf Left: Right: Point of Measurement: 36 cm From Medial Instep 50.5 cm  cm Ankle Left: Right: Point of Measurement: 12 cm From Medial Instep 33.5 cm cm Vascular Assessment Pulses: Dorsalis Pedis Palpable: [Left:Yes] [Right:Yes] Electronic Signature(s) Signed: 12/20/2019 4:59:12 PM By: [name] Entered By: [name] on 12/20/2019 08:55:27 [name] -------------------------------------------------------------------------------- Multi Wound Chart Details Patient Name: [name]. Date of Service: 12/20/2019 8:30 AM Medical Record Number: [id number] Patient Account Number: [id number] Date of Birth/Sex: 08/20/1951 (69 y.o. M) Treating RN: [name] Primary Care Elli Groesbeck: [name] Other Clinician: Referring [name]: [name] Treating [name]/Extender: [name] [name] in Treatment: 12 Vital Signs Height(in): 69 Pulse(bpm): 78  Weight(lbs): 295 Blood Pressure(mmHg): 138/88 Body Mass Index(BMI): 44 Temperature(F): 98.8 Respiratory Rate 16 (breaths/min): Wound Assessments Treatment Notes Electronic Signature(s) Signed: 12/20/2019 11:39:54 AM By: [name] Entered By: [name] on 12/20/2019 09:19:53 [name] -------------------------------------------------------------------------------- [place] Details Patient Name: [name], [name]. Date of Service: 12/20/2019 8:30 AM Medical Record Number: [id number] Patient Account Number: [id number] Date of Birth/Sex: 08/25/1951 (69 y.o. M) Treating RN: [name] Primary Care [name]: [name] Other Clinician: Referring [name]: [name] Treating [name]/Extender: [name], [name] in Treatment: 12 Active Inactive Nutrition Nursing Diagnoses: Impaired glucose control: actual or potential Goals: Patient/caregiver verbalizes understanding of need to maintain therapeutic glucose control per primary care physician Date Initiated: 09/27/2019 Target Resolution Date: 10/19/2019 Goal  Status: Active Interventions: Assess HgA1c results as ordered upon admission and as needed Notes: Orientation to the Wound Care Program Nursing Diagnoses: Knowledge deficit related to the wound healing center program Goals: Patient/caregiver will verbalize understanding of the [place] Date Initiated: 09/27/2019 Target Resolution Date: 10/19/2019 Goal Status: Active Interventions: Provide education on orientation to the wound center Notes: Wound/Skin Impairment Nursing Diagnoses: Impaired tissue integrity Goals: Ulcer/skin breakdown will have a volume reduction of 30% by week 4 Date Initiated: 09/27/2019 Target Resolution Date: 10/25/2019 Goal Status: Active Interventions: Assess ulceration(s) every visit [name], [name] Notes: Electronic Signature(s) Signed: 12/20/2019 11:39:54 AM By: [name] Entered By: [name] on 12/20/2019 09:19:44 [place], Emmett M. ([id number]) -------------------------------------------------------------------------------- Non-Wound Condition Assessment Details Patient Name: [name]. Date of Service: 12/20/2019 8:30 AM Medical Record Number: [id number] Patient Account Number: [id number] Date of Birth/Sex: 1951-07-10 (69 y.o. M) Treating RN: [name] Primary Care Illias Pantano: [name] Other Clinician: Referring Geoffry Bannister: [name] Treating Bladimir Auman/Extender: [name] [name] in Treatment: 12 Non-Wound Condition: Condition: Lymphedema Location: Leg Side: Bilateral Photos Electronic Signature(s) Signed: 12/20/2019 4:59:12 PM By: [name] Entered By: [name] on 12/20/2019 08:56:51 Wilz, [name] -------------------------------------------------------------------------------- Pain Assessment Details Patient Name: [name]. Date of Service: 12/20/2019 8:30 AM Medical Record Number: [id number] Patient Account Number: [id number] Date of Birth/Sex:  03-Aug-1951 (69 y.o. M) Treating RN: [name] Primary Care Rin Gorton: [name] Other Clinician: Referring Odai Wimmer: [name] Treating Euphemia Lingerfelt/Extender: [name], [name] in Treatment: 12 Active Problems Location of Pain Severity and Description of Pain Patient Has Paino Yes Site Locations Rate the pain. Current Pain Level: 3 Pain Management and Medication Current Pain Management: Electronic Signature(s) Signed: 12/20/2019 11:39:54 AM By: [name] Signed: 12/20/2019 11:49:35 AM By: [name] Entered By: [name] on 12/20/2019 08:49:43 Morera, [name] -------------------------------------------------------------------------------- Patient/Caregiver Education Details Patient Name: [name]. Date of Service: 12/20/2019 8:30 AM Medical Record Number: [id number] Patient Account Number: [id number] Date of Birth/Gender: [birth year] (69 y.o. M) Treating RN: [name] Primary Care Physician: [name] Other Clinician: Referring Physician: Aura [name] Treating Physician/Extender: Sharalyn Ink in Treatment: 12 Education Assessment Education Provided To: Patient Education Topics Provided Wound/Skin Impairment: Handouts: Caring for Your Ulcer Methods: Demonstration, Explain/Verbal Responses: State content correctly Electronic Signature(s) Signed: 12/20/2019 11:39:54 AM By: [name] Entered By: [name] on 12/20/2019 09:24:53 Garrison, [name] -------------------------------------------------------------------------------- [place] Details Patient Name: [name]. Date of Service: 12/20/2019 8:30 AM Medical Record Number: [id number] Patient Account Number: [id number] Date of Birth/Sex: Nov 23, 1950 (69 y.o. M) Treating RN: [name] Primary Care Gordon Carlson: [name] Other Clinician: Referring Bani Gianfrancesco: [name] Treating Eldoris Beiser/Extender:  [name], [name]  in Treatment: 12 Vital Signs Time Taken: 08:45 Temperature (F): 98.8 Height (in): 69 Pulse (bpm): 78 Weight (lbs): 295 Respiratory Rate (breaths/min): 16 Body Mass Index (BMI): 43.6 Blood Pressure (mmHg): 138/88 Reference Range: 80 - 120 mg / dl Electronic Signature(s) Signed: 12/20/2019 11:49:35 AM By: [name] Entered By: [name] on 12/20/2019 08:50:11

## 2019-12-20 NOTE — Progress Notes (Addendum)
ALMO, LECRONE ([id number]) Visit Report for 12/20/2019 Chief Complaint Document Details Patient Name: [name], [name]. Date of Service: 12/20/2019 8:30 AM Medical Record Number: [id number] Patient Account Number: [id number] Date of Birth/Sex: 1951/07/01 (69 y.o. M) Treating RN: Army Melia Primary Care Provider: Aura Dials Other Clinician: Referring Provider: Aura Dials Treating Provider/Extender: Melburn Hake, Julina Altmann Weeks in Treatment: 12 Information Obtained from: Patient Chief Complaint Bilateral LE lymphedema left leg cellulitis Electronic Signature(s) Signed: 12/20/2019 9:15:42 AM By: Worthy Keeler PA-C Entered By: Worthy Keeler on 12/20/2019 09:15:41 Scogin, [name] ([id number]) -------------------------------------------------------------------------------- HPI Details Patient Name: [name]. Date of Service: 12/20/2019 8:30 AM Medical Record Number: [id number] Patient Account Number: [id number] Date of Birth/Sex: 1950/12/16 (69 y.o. M) Treating RN: Army Melia Primary Care Provider: Aura Dials Other Clinician: Referring Provider: Aura Dials Treating Provider/Extender: Melburn Hake, Jailyn Langhorst Weeks in Treatment: 12 History of Present Illness HPI Description: 09/27/2019 on evaluation today patient appears to be doing poorly in regard to his bilateral lower extremities he does have bilateral lower extremity lymphedema. With that being said he also has a history of diabetes type 2, hypertension, congestive heart failure, and apparently has chronic venous insufficiency which has led to the lymphedema. Currently he also shows signs of an infection of the left lower extremity which does have me concerned at this point as well. He tells me that he is not really having any significant pain but has had a lot of trouble with his legs in general. He does have a compression stocking he is wearing on the right lower extremity he was wrapped with an Unna boot on the left  lower extremity. The patient tells me that overall he has been doing with this for several weeks and home health has been initiated. He has not been on any antibiotics yet and he tells me that the home health nurse was not sure that it was infected based on what I see today I am concerned that this likely is infected at this point. Fortunately however there does not appear to be any evidence of systemic infection which is good news. The Unna boot does seem to be helping control the edema although he may benefit from a stronger compression wrap I think that at this point without having the ABIs completed the Unna boot is where I would stand. He is supposed to be having an arterial study with ABI as ordered by his cardiologist but he states he may have missed that appointment and has not called to reschedule he needs to contact them to get this rescheduled as soon as possible I explained to him. He tells me that he is draining a lot as far as the left lower extremity is concerned and that is something that he feels like is not lasting very long as far as the wraps are concerned when they put him on they seem to get wet extremely quickly. 10/04/2019 on evaluation today patient appears to be doing better compared to last week's evaluation. Fortunately there is no signs of active infection at this time. He has been tolerating the dressing changes without complication. He did undergo arterial studies yesterday and has an appointment tomorrow with the vascular specialist. With that being said his ABI on the left was 1.25 on the right 1.19 with a TBI of 0.61 on the left and 0.69 on the right. There was stated to be no evidence of obvious significant arterial disease and the patient had pretty much triphasic blood flow throughout. Overall  I am very pleased with how things seem to be progressing. We will see if the vascular doctor says anything different tomorrow but I even at this point I feel like he would  do well with a stronger compression wrap to try to get some the fluid out of his leg at this point. The good news is his drainage has slowed down considerably I do believe the antibiotics have been beneficial for him. 10/18/2019 on evaluation today patient appears to be doing poorly in regard to his left lower extremity. He still having a lot of drainage and weeping from his leg this appears to be much more erythematous and in fact I am concerned that the erythema may be spreading up his leg as well. He has not experienced any fevers, chills, nausea, vomiting, or diarrhea up to this point. I explained to him that I really feel like based on what I am seeing he may need to potentially go to the hospital for evaluation and treatment potentially even with IV antibiotics depending on what they see. With that being said he tells me that he is not really able to do that due to the fact that he is the primary caregiver for his wife who is legally blind. He states he is not good to be able to have anything to help as far as caring for her if he were to go into the hospital. Nonetheless he is unsure what to do in this regard. I really think that he needs faster treatment. For that reason I am going to likely try to get in touch with the infectious disease office to see if I can get him in sooner than [place]. 10/25/2019 on evaluation today patient appears to be doing a little better in regard to his weeping and edema compared to the last time I saw him. The dorsal surface of his foot is a little bit drier compared to what was this is good news. He did see Dr. Drucilla Schmidt on 10/22/2019. Dr. Drucilla Schmidt did feel that potentially this could be secondary to some cellulitis and he recommended subsequently placing the patient on Zyvox which the patient feels like has helped as well. There does not appear to be any signs of active infection at this time which is good news systemically. I am very pleased that Dr. Drucilla Schmidt was  able to see him so quickly I do appreciate this greatly. With that being said he also did recommend an MRI for the patient to evaluate for any deeper signs of infection. That is actually scheduled for this coming Tuesday. He also has a follow-up appoint with Dr. Drucilla Schmidt on Tuesday. The last thing Dr. Drucilla Schmidt did mention was the possibility of more aggressive diuresis. The patient is on torsemide. With that being said this is managed by his cardiologist. I think we may want to get in touch with her clinic and see if possibly this is something that could be increased for a short amount of time depending on their recommendations to see if we can get some additional fluid off of him which in turn would likely help tremendously with his BRADD, CUBERO. ([id number]) weeping and edema. He is in agreement with this plan if his cardiologist is in agreement. 11/01/2019 on evaluation today patient unfortunately notes that he did not get his MRI done which was ordered by Dr. Drucilla Schmidt. Apparently when he went in it was 6:30 in the morning and they told him that he was there to get an MRI of  his toes. With that being said the MRI was ordered of his ankle and foot and so long story short the patient said that he was not getting MRI of his toes and left. Dr. Drucilla Schmidt was alerted and again it does not appear that at all that was what he ordered that he did order the ankle and foot but nonetheless that is beside the point at this time. The patient still has considerable edema noted at this time the nurse that came out last after just put a Kerlix and Coban wrap on the patient she did not even properly wrap him. Subsequently they have also discharged him being that they state he is not skilled at this point. Therefore he is going to have to be seen here in the office only. Fortunately there is no signs of systemic infection he still has a lot of erythema around the ankle and lower extremity region again there are any  specific open wounds just general weeping and evidence of erythema which Dr. Drucilla Schmidt is questioning whether or not this is even infection or if it is just more secondary to the patient's lymphedema. He has had venous reflux studies which were negative for any abnormal findings unfortunately that is also not good to be an answer for [place] here. I have recommended the patient today contact his primary care provider to see if they can increase his fluid pills and I also think we need to increase his compression wrap to a 4-layer compression wrap to try to more effectively manage his edema. 11/08/2019 on evaluation today patient appears unfortunately to still be doing poorly. I did speak with his primary care provider office last week they were supposed to call him to advise what should be done as far as his fluid pills were concerned. With that being said they did not contact him as far as the patient tells me. I am not really sure exactly what is going on as far as that is concerned. Nonetheless I really think that the patient needs to be admitted to the hospital for treatment likely as I explained to him he is going to require IV Lasix to get fluid off and to be honest also think he may need IV antibiotics. I have made all the outpatient referrals I can to try to get things under control he has been on oral antibiotics I really just have not seen any significant improvement he was even on linezolid. That was prescribed By infectious disease. Overall I am very concerned about the fact that this is not getting better. 11/22/2019 on evaluation today patient unfortunately is not doing very well with regard to his lower extremity edema. The left lower extremity is significantly swollen and still significantly erythematous. He has bright green drainage. He did just see Dr. Drucilla Schmidt on 11/19/2019 and Dr. Drucilla Schmidt did not feel like that this was infected. Subsequently he states that he feels like the issue is more 1 of  not being at maximal diuresis and recommended the patient see his cardiologist to ensure that this was achieved. Subsequently Dr. Drucilla Schmidt states this is a issue with venous stasis dermatitis which again I completely understand that that may definitely be the case but unfortunately we've not been able to get things under control even with maximum compression at a 4 layer compression wrap. The patient is still having significant issues. Either way I do think the patient is getting need to follow-up with his cardiologist to see what we can do Dr. Drucilla Schmidt  did make mention that the patient had a flareup of infection that Keflex 500 mg 4 times a day or Augmentin twice a day would be good options for nonpurulent cellulitis. The patient was again supposed to have gone to the hospital on the weekend following Christmas after he got things settled with family as far as getting someone to take care of his wife. With that being said he tells me that when it came around to that time he just didn't think about it. When asked him about why the drainage from his leg and the issues he is having that and remind him he told me "well it really hasn't changed". 11/29/2019 on evaluation today patient presents for follow-up concerning his left lower extremity edema and weeping. He did see his cardiologist at my request after I discussed with him the situation as well. This is Binnie Kand who is a family Designer, jewellery that he saw. Subsequently the patient was recommended to start metolazone 5 mg daily for the next 5 days. They also recommended that really he needed essentially bed rest with elevation of his legs over the next week. With that being said the patient states that he is unable to even get in bed due to the fact that he has back trouble and has to sleep in his recliner. He never really gets in the bed at all. This is probably part of the issue coupled with his heart not functioning quite as well as what we  would like to see and then subsequently he also has the issue of being overweight which contributes to this as well. Overall were still trying to figure out how to best manage this to get his tweaking under control and prevent anything from worsening. Again Dr. Drucilla Schmidt really did not feel like there was any evidence of infectious processes going on at this point. 12/06/2019 on evaluation today patient's leg actually appears to be doing better with regard to edema control I am actually pleased in this regard. He also seems to be improving as far as drainage is concerned from the leg this is much better than what it was in the past. Fortunately there is no signs of active infection which is good news and overall the patient states he is actually happier with where things stand at this point. 12/13/2019 on evaluation today patient unfortunately is very dry with cracked skin noted at this point. Fortunately there is no signs of active infection at this time. With that being said I think he is really needs some moisturizing lotion something really good to counter loosen up the dry cracked skin I think even if 40% urea cream would be ideal at this point. I discussed this with him today. In fact even look this up on his phone so that he can order it if he needs to although he may be able to find it [place], Odai M. ([id number]) locally I am just not sure. I told him to check with his pharmacy first. 12/20/2019 on evaluation today patient unfortunately continues to have issues with swelling of his left lower extremity. We did use Tubigrip last week to try to give him a chance to be able to take this off and take a shower and then reapply it. With that being said he just cut it off and then did not have anything to reapply he did that on Friday after we saw him on Thursday and had nothing on in the interim for almost an entire week since.  Obviously that was not the direction that was given to him and when I  asked him about it he stated that he just could not bend over to get off and he does not have anyone to help him. With that being said I do not think this is can be possible to have him take this off to take a shower simply due to the fact that again were not going to be able to keep the edema under control and he can get this off and back on it hurts his back way too much. Electronic Signature(s) Signed: 12/20/2019 9:28:00 AM By: Worthy Keeler PA-C Entered By: Worthy Keeler on 12/20/2019 09:28:00 Bowden, [name] ([id number]) -------------------------------------------------------------------------------- Physical Exam Details Patient Name: TREYDAN, MADEJA. Date of Service: 12/20/2019 8:30 AM Medical Record Number: [id number] Patient Account Number: [id number] Date of Birth/Sex: 10-11-1951 (69 y.o. M) Treating RN: Army Melia Primary Care Provider: Aura Dials Other Clinician: Referring Provider: Aura Dials Treating Provider/Extender: STONE III, Ramello Cordial Weeks in Treatment: 72 Constitutional Well-nourished and well-hydrated in no acute distress. Respiratory normal breathing without difficulty. Psychiatric this patient is able to make decisions and demonstrates good insight into disease process. Alert and Oriented x 3. pleasant and cooperative. Notes Patient's lower extremity currently showed signs of significant edema even much more than was noted last week obviously that comes from not being in a compression wrap for almost an entire week. With that being said I do not think the Tubigrip is going to be an ideal thing for him going forward he cannot get it off and back on therefore defeats the purpose of why we switched away from the wrap to do the Tubigrip. Electronic Signature(s) Signed: 12/20/2019 9:28:35 AM By: Worthy Keeler PA-C Entered By: Worthy Keeler on 12/20/2019 09:28:34 [name]  ([id number]) -------------------------------------------------------------------------------- Physician Orders Details Patient Name: [name]. Date of Service: 12/20/2019 8:30 AM Medical Record Number: [id number] Patient Account Number: [id number] Date of Birth/Sex: 1951-10-06 (69 y.o. M) Treating RN: Army Melia Primary Care Provider: Aura Dials Other Clinician: Referring Provider: Aura Dials Treating Provider/Extender: Melburn Hake, Urijah Raynor Weeks in Treatment: 12 Verbal / Phone Orders: No Diagnosis Coding ICD-10 Coding Code Description I89.0 Lymphedema, not elsewhere classified I87.2 Venous insufficiency (chronic) (peripheral) E11.628 Type 2 diabetes mellitus with other skin complications 99991111 Essential (primary) hypertension I50.42 Chronic combined systolic (congestive) and diastolic (congestive) heart failure L02.416 Cutaneous abscess of left lower limb Wound Cleansing o Clean wound with Normal Saline. - in office o Cleanse wound with mild soap and water - left leg Skin Barriers/Peri-Wound Care o Moisturizing lotion - with anti-sept spray Secondary Dressing o XtraSorb - over draining areas Dressing Change Frequency o Change dressing every week Follow-up Appointments o Return Appointment in 1 week. Edema Control o 4-Layer Compression System - Left Lower Extremity. o Elevate legs to the level of the heart and pump ankles as often as possible Patient Medications Allergies: codeine Notifications Medication Indication Start End doxycycline hyclate 12/20/2019 DOSE 1 - oral 100 mg capsule - 1 capsule oral taken 2 times a day for 14 days Electronic Signature(s) Signed: 12/20/2019 9:31:16 AM By: Worthy Keeler PA-C Entered By: Worthy Keeler on 12/20/2019 09:31:16 Koslow, [name] ([id number]) -------------------------------------------------------------------------------- Problem List Details Patient Name: [name]. Date of Service: 12/20/2019  8:30 AM Medical Record Number: [id number] Patient Account Number: [id number] Date of Birth/Sex: 05/08/1951 (69 y.o. M) Treating RN: Army Melia Primary Care Provider: Aura Dials Other Clinician: Referring Provider: Aura Dials  Treating Provider/Extender: Melburn Hake, Kinsey Karch Weeks in Treatment: 12 Active Problems ICD-10 Evaluated Encounter Code Description Active Date Today Diagnosis I89.0 Lymphedema, not elsewhere classified 09/27/2019 No Yes I87.2 Venous insufficiency (chronic) (peripheral) 09/27/2019 No Yes E11.628 Type 2 diabetes mellitus with other skin complications 123XX123 No Yes I10 Essential (primary) hypertension 09/27/2019 No Yes I50.42 Chronic combined systolic (congestive) and diastolic 123XX123 No Yes (congestive) heart failure L02.416 Cutaneous abscess of left lower limb 09/27/2019 No Yes Inactive Problems Resolved Problems Electronic Signature(s) Signed: 12/20/2019 9:15:36 AM By: Worthy Keeler PA-C Entered By: Worthy Keeler on 12/20/2019 09:15:35 Bordonaro, [name] ([id number]) -------------------------------------------------------------------------------- Progress Note Details Patient Name: [name]. Date of Service: 12/20/2019 8:30 AM Medical Record Number: [id number] Patient Account Number: [id number] Date of Birth/Sex: 05-25-1951 (69 y.o. M) Treating RN: Army Melia Primary Care Provider: Aura Dials Other Clinician: Referring Provider: Aura Dials Treating Provider/Extender: Melburn Hake, Nahuel Wilbert Weeks in Treatment: 12 Subjective Chief Complaint Information obtained from Patient Bilateral LE lymphedema left leg cellulitis History of Present Illness (HPI) 09/27/2019 on evaluation today patient appears to be doing poorly in regard to his bilateral lower extremities he does have bilateral lower extremity lymphedema. With that being said he also has a history of diabetes type 2, hypertension, congestive heart failure, and apparently has  chronic venous insufficiency which has led to the lymphedema. Currently he also shows signs of an infection of the left lower extremity which does have me concerned at this point as well. He tells me that he is not really having any significant pain but has had a lot of trouble with his legs in general. He does have a compression stocking he is wearing on the right lower extremity he was wrapped with an Unna boot on the left lower extremity. The patient tells me that overall he has been doing with this for several weeks and home health has been initiated. He has not been on any antibiotics yet and he tells me that the home health nurse was not sure that it was infected based on what I see today I am concerned that this likely is infected at this point. Fortunately however there does not appear to be any evidence of systemic infection which is good news. The Unna boot does seem to be helping control the edema although he may benefit from a stronger compression wrap I think that at this point without having the ABIs completed the Unna boot is where I would stand. He is supposed to be having an arterial study with ABI as ordered by his cardiologist but he states he may have missed that appointment and has not called to reschedule he needs to contact them to get this rescheduled as soon as possible I explained to him. He tells me that he is draining a lot as far as the left lower extremity is concerned and that is something that he feels like is not lasting very long as far as the wraps are concerned when they put him on they seem to get wet extremely quickly. 10/04/2019 on evaluation today patient appears to be doing better compared to last week's evaluation. Fortunately there is no signs of active infection at this time. He has been tolerating the dressing changes without complication. He did undergo arterial studies yesterday and has an appointment tomorrow with the vascular specialist. With that  being said his ABI on the left was 1.25 on the right 1.19 with a TBI of 0.61 on the left and 0.69 on the right. There  was stated to be no evidence of obvious significant arterial disease and the patient had pretty much triphasic blood flow throughout. Overall I am very pleased with how things seem to be progressing. We will see if the vascular doctor says anything different tomorrow but I even at this point I feel like he would do well with a stronger compression wrap to try to get some the fluid out of his leg at this point. The good news is his drainage has slowed down considerably I do believe the antibiotics have been beneficial for him. 10/18/2019 on evaluation today patient appears to be doing poorly in regard to his left lower extremity. He still having a lot of drainage and weeping from his leg this appears to be much more erythematous and in fact I am concerned that the erythema may be spreading up his leg as well. He has not experienced any fevers, chills, nausea, vomiting, or diarrhea up to this point. I explained to him that I really feel like based on what I am seeing he may need to potentially go to the hospital for evaluation and treatment potentially even with IV antibiotics depending on what they see. With that being said he tells me that he is not really able to do that due to the fact that he is the primary caregiver for his wife who is legally blind. He states he is not good to be able to have anything to help as far as caring for her if he were to go into the hospital. Nonetheless he is unsure what to do in this regard. I really think that he needs faster treatment. For that reason I am going to likely try to get in touch with the infectious disease office to see if I can get him in sooner than [place]. 10/25/2019 on evaluation today patient appears to be doing a little better in regard to his weeping and edema compared to the last time I saw him. The dorsal surface of his foot  is a little bit drier compared to what was this is good news. He did see Dr. Drucilla Schmidt on 10/22/2019. Dr. Drucilla Schmidt did feel that potentially this could be secondary to some cellulitis and he recommended subsequently placing the patient on Zyvox which the patient feels like has helped as well. There does not appear to be any signs of active infection at this time which is good news systemically. I am very pleased that Dr. Luther Parody, [facility] M. ([id number]) was able to see him so quickly I do appreciate this greatly. With that being said he also did recommend an MRI for the patient to evaluate for any deeper signs of infection. That is actually scheduled for this coming Tuesday. He also has a follow-up appoint with Dr. Drucilla Schmidt on Tuesday. The last thing Dr. Drucilla Schmidt did mention was the possibility of more aggressive diuresis. The patient is on torsemide. With that being said this is managed by his cardiologist. I think we may want to get in touch with her clinic and see if possibly this is something that could be increased for a short amount of time depending on their recommendations to see if we can get some additional fluid off of him which in turn would likely help tremendously with his weeping and edema. He is in agreement with this plan if his cardiologist is in agreement. 11/01/2019 on evaluation today patient unfortunately notes that he did not get his MRI done which was ordered by Dr. Drucilla Schmidt. Apparently when he  went in it was 6:30 in the morning and they told him that he was there to get an MRI of his toes. With that being said the MRI was ordered of his ankle and foot and so long story short the patient said that he was not getting MRI of his toes and left. Dr. Drucilla Schmidt was alerted and again it does not appear that at all that was what he ordered that he did order the ankle and foot but nonetheless that is beside the point at this time. The patient still has considerable edema noted at this time the  nurse that came out last after just put a Kerlix and Coban wrap on the patient she did not even properly wrap him. Subsequently they have also discharged him being that they state he is not skilled at this point. Therefore he is going to have to be seen here in the office only. Fortunately there is no signs of systemic infection he still has a lot of erythema around the ankle and lower extremity region again there are any specific open wounds just general weeping and evidence of erythema which Dr. Drucilla Schmidt is questioning whether or not this is even infection or if it is just more secondary to the patient's lymphedema. He has had venous reflux studies which were negative for any abnormal findings unfortunately that is also not good to be an answer for [place] here. I have recommended the patient today contact his primary care provider to see if they can increase his fluid pills and I also think we need to increase his compression wrap to a 4-layer compression wrap to try to more effectively manage his edema. 11/08/2019 on evaluation today patient appears unfortunately to still be doing poorly. I did speak with his primary care provider office last week they were supposed to call him to advise what should be done as far as his fluid pills were concerned. With that being said they did not contact him as far as the patient tells me. I am not really sure exactly what is going on as far as that is concerned. Nonetheless I really think that the patient needs to be admitted to the hospital for treatment likely as I explained to him he is going to require IV Lasix to get fluid off and to be honest also think he may need IV antibiotics. I have made all the outpatient referrals I can to try to get things under control he has been on oral antibiotics I really just have not seen any significant improvement he was even on linezolid. That was prescribed By infectious disease. Overall I am very concerned about the fact  that this is not getting better. 11/22/2019 on evaluation today patient unfortunately is not doing very well with regard to his lower extremity edema. The left lower extremity is significantly swollen and still significantly erythematous. He has bright green drainage. He did just see Dr. Drucilla Schmidt on 11/19/2019 and Dr. Drucilla Schmidt did not feel like that this was infected. Subsequently he states that he feels like the issue is more 1 of not being at maximal diuresis and recommended the patient see his cardiologist to ensure that this was achieved. Subsequently Dr. Drucilla Schmidt states this is a issue with venous stasis dermatitis which again I completely understand that that may definitely be the case but unfortunately we've not been able to get things under control even with maximum compression at a 4 layer compression wrap. The patient is still having significant issues. Either way  I do think the patient is getting need to follow-up with his cardiologist to see what we can do Dr. Drucilla Schmidt did make mention that the patient had a flareup of infection that Keflex 500 mg 4 times a day or Augmentin twice a day would be good options for nonpurulent cellulitis. The patient was again supposed to have gone to the hospital on the weekend following Christmas after he got things settled with family as far as getting someone to take care of his wife. With that being said he tells me that when it came around to that time he just didn't think about it. When asked him about why the drainage from his leg and the issues he is having that and remind him he told me "well it really hasn't changed". 11/29/2019 on evaluation today patient presents for follow-up concerning his left lower extremity edema and weeping. He did see his cardiologist at my request after I discussed with him the situation as well. This is Binnie Kand who is a family Designer, jewellery that he saw. Subsequently the patient was recommended to start metolazone 5 mg daily  for the next 5 days. They also recommended that really he needed essentially bed rest with elevation of his legs over the next week. With that being said the patient states that he is unable to even get in bed due to the fact that he has back trouble and has to sleep in his recliner. He never really gets in the bed at all. This is probably part of the issue coupled with his heart not functioning quite as well as what we would like to see and then subsequently he also has the issue of being overweight which contributes to this as well. Overall were still trying to figure out how to best manage this to get his tweaking under control and prevent anything from worsening. Again Dr. Drucilla Schmidt really did not feel like there was any evidence of infectious processes going on at this point. 12/06/2019 on evaluation today patient's leg actually appears to be doing better with regard to edema control I am actually pleased in this regard. He also seems to be improving as far as drainage is concerned from the leg this is much better than what it was in the past. Fortunately there is no signs of active infection which is good news and overall the patient states he is OSBORN, HOGLEN. ([id number]) actually happier with where things stand at this point. 12/13/2019 on evaluation today patient unfortunately is very dry with cracked skin noted at this point. Fortunately there is no signs of active infection at this time. With that being said I think he is really needs some moisturizing lotion something really good to counter loosen up the dry cracked skin I think even if 40% urea cream would be ideal at this point. I discussed this with him today. In fact even look this up on his phone so that he can order it if he needs to although he may be able to find it locally I am just not sure. I told him to check with his pharmacy first. 12/20/2019 on evaluation today patient unfortunately continues to have issues with swelling of  his left lower extremity. We did use Tubigrip last week to try to give him a chance to be able to take this off and take a shower and then reapply it. With that being said he just cut it off and then did not have anything to reapply he did  that on Friday after we saw him on Thursday and had nothing on in the interim for almost an entire week since. Obviously that was not the direction that was given to him and when I asked him about it he stated that he just could not bend over to get off and he does not have anyone to help him. With that being said I do not think this is can be possible to have him take this off to take a shower simply due to the fact that again were not going to be able to keep the edema under control and he can get this off and back on it hurts his back way too much. Objective Constitutional Well-nourished and well-hydrated in no acute distress. Vitals Time Taken: 8:45 AM, Height: 69 in, Weight: 295 lbs, BMI: 43.6, Temperature: 98.8 F, Pulse: 78 bpm, Respiratory Rate: 16 breaths/min, Blood Pressure: 138/88 mmHg. Respiratory normal breathing without difficulty. Psychiatric this patient is able to make decisions and demonstrates good insight into disease process. Alert and Oriented x 3. pleasant and cooperative. General Notes: Patient's lower extremity currently showed signs of significant edema even much more than was noted last week obviously that comes from not being in a compression wrap for almost an entire week. With that being said I do not think the Tubigrip is going to be an ideal thing for him going forward he cannot get it off and back on therefore defeats the purpose of why we switched away from the wrap to do the Tubigrip. Other Condition(s) Patient presents with Lymphedema located on the Bilateral Leg. Assessment Active Problems ICD-10 Lymphedema, not elsewhere classified Scruton, Merdith M. 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 Post procedure Diagnosis Wound #: Same as Pre-Procedure Plan Wound Cleansing: Clean wound with Normal Saline. - in office Cleanse wound with mild soap and water - left leg Skin Barriers/Peri-Wound Care: Moisturizing lotion - with anti-sept spray Secondary Dressing: XtraSorb - over draining areas Dressing Change Frequency: Change dressing every week Follow-up Appointments: Return Appointment in 1 week. Edema Control: 4-Layer Compression System - Left Lower Extremity. Elevate legs to the level of the heart and pump ankles as often as possible The following medication(s) was prescribed: doxycycline hyclate oral 100 mg capsule 1 1 capsule oral taken 2 times a day for 14 days starting 12/20/2019 1. I would discontinue the Tubigrip will go back to using XtraSorb followed by a 4-layer compression wrap which I think is can be better for him at this point. 2. I would recommend as well that we continue to change this just once a week here in the office as the patient is not able to even change out the Tubigrip on his own he does wear compression socks on the right lower extremity but again he uses a assistive device to get this on. He does not have any help at home as he tells me his wife is blind. 3. I would recommend he continue to elevate his legs much as possible. 4. I am going to send in prescription for doxycycline for 14 days just due to some increased erythema in the posterior aspect of his leg although to be honest this could just be secondary to the increased edema but again I  cannot be 100% sure here. We will see patient back for reevaluation in 1 week here in the clinic. If  anything worsens or changes patient will contact our office for additional recommendations. Electronic Signature(s) BENNO, SHANGRAW ([id number]) Signed: 12/20/2019 9:31:39 AM By: Worthy Keeler PA-C Previous Signature: 12/20/2019 9:29:49 AM Version By: Worthy Keeler PA-C Entered By: Worthy Keeler on 12/20/2019 09:31:39 Recker, [name] ([id number]) -------------------------------------------------------------------------------- SuperBill Details Patient Name: [name]. Date of Service: 12/20/2019 Medical Record Number: [id number] Patient Account Number: [id number] Date of Birth/Sex: 1951-07-15 (69 y.o. M) Treating RN: Army Melia Primary Care Provider: Aura Dials Other Clinician: Referring Provider: Aura Dials Treating Provider/Extender: Melburn Hake, Gennie Eisinger Weeks in Treatment: 12 Diagnosis Coding ICD-10 Codes Code Description I89.0 Lymphedema, not elsewhere classified I87.2 Venous insufficiency (chronic) (peripheral) E11.628 Type 2 diabetes mellitus with other skin complications 99991111 Essential (primary) hypertension I50.42 Chronic combined systolic (congestive) and diastolic (congestive) heart failure L02.416 Cutaneous abscess of left lower limb Facility Procedures CPT4 Code: [id number] Description: (Facility Use Only) [phone] - [place] [facility] LT LEG Modifier: Quantity: 1 Physician Procedures CPT4 Code: [id number] Description: 99214 - WC PHYS LEVEL 4 - EST PT ICD-10 Diagnosis Description I89.0 Lymphedema, not elsewhere classified I87.2 Venous insufficiency (chronic) (peripheral) E11.628 Type 2 diabetes mellitus with other skin complications 99991111 Essential (primary)  hypertension Modifier: Quantity: 1 Electronic Signature(s) Signed: 12/20/2019 9:32:09 AM By: Worthy Keeler PA-C Entered By: Worthy Keeler on 12/20/2019 09:32:09

## 2019-12-27 ENCOUNTER — Encounter: Payer: Medicare Other | Admitting: Physician Assistant

## 2019-12-27 ENCOUNTER — Other Ambulatory Visit: Payer: Self-pay

## 2019-12-27 DIAGNOSIS — I890 Lymphedema, not elsewhere classified: Secondary | ICD-10-CM | POA: Diagnosis not present

## 2019-12-28 NOTE — Progress Notes (Signed)
[name], [name] Visit Report for 12/27/2019 Arrival Information Details Patient Name: [name], [name]. Date of Service: 12/27/2019 8:30 AM Medical Record Number: [id number] Patient Account Number: [id number] Date of Birth/Sex: 29-Sep-1951 (69 y.o. M) Treating RN: [name] Primary Care [name]: [name] Other Clinician: Referring [name]: [name] Treating [name]/Extender: [name], [name] in Treatment: 13 Visit Information History Since Last Visit Added or deleted any medications: No Patient Arrived: Ambulatory Any new allergies or adverse reactions: No Arrival Time: 08:41 Had a fall or experienced change in No Accompanied By: self activities of daily living that may affect Transfer Assistance: None risk of falls: Patient Identification Verified: Yes Signs or symptoms of abuse/neglect since last visito No Secondary Verification Process Yes Hospitalized since last visit: No Completed: Implantable device outside of the clinic excluding No Patient Requires Transmission-Based No cellular tissue based products placed in the center Precautions: since last visit: Patient Has Alerts: Yes Pain Present Now: Yes Patient Alerts: DM II ABI11/18/20 L 1.25 R 1.19 Electronic Signature(s) Signed: 12/27/2019 11:32:19 AM By: [name] RCP, RRT, CHT Entered By: [name] on 12/27/2019 08:42:06 [name] -------------------------------------------------------------------------------- Compression Therapy Details Patient Name: [name]. Date of Service: 12/27/2019 8:30 AM Medical Record Number: [id number] Patient Account Number: [id number] Date of Birth/Sex: [birth year] (69 y.o. M) Treating RN: [name] Primary Care Damoney Julia: [name] Other Clinician: Referring [name]: [name] Treating [name]/Extender: STONE III, [name] in Treatment: 13 Compression Therapy Performed  for Wound Assessment: NonWound Condition Lymphedema - Bilateral Leg Performed By: Clinician Keith Melia, RN Compression Type: Four Layer Pre Treatment ABI: 1.2 Post Procedure Diagnosis Same as Pre-procedure Electronic Signature(s) Signed: 12/27/2019 11:47:46 AM By: [name] Entered By: [name] on 12/27/2019 08:59:11 [name], [name] -------------------------------------------------------------------------------- Encounter Discharge Information Details Patient Name: [name], [name]. Date of Service: 12/27/2019 8:30 AM Medical Record Number: [id number] Patient Account Number: [id number] Date of Birth/Sex: Jul 05, 1951 (69 y.o. M) Treating RN: [name] Primary Care Decklin Weddington: [name] Other Clinician: Referring [name]: [name] Treating [name]/Extender: [name], [name] in Treatment: 13 Encounter Discharge Information Items Discharge Condition: Stable Ambulatory Status: Ambulatory Discharge Destination: Home Transportation: Private Auto Accompanied By: self Schedule Follow-up Appointment: Yes Clinical Summary of Care: Electronic Signature(s) Signed: 12/27/2019 11:47:46 AM By: [name] Entered By: [name] on 12/27/2019 09:01:03 [name], [name] -------------------------------------------------------------------------------- Lower Extremity Assessment Details Patient Name: [name]. Date of Service: 12/27/2019 8:30 AM Medical Record Number: [id number] Patient Account Number: [id number] Date of Birth/Sex: November 18, 1950 (69 y.o. M) Treating RN: [name] Primary Care [name]: [name] Other Clinician: Referring [name]: [name] Treating [name]/Extender: [name], [name] in Treatment: 13 Edema Assessment Assessed: [Left: No] [Right: No] Edema: [Left: Ye] [Right: s] Calf Left: Right: Point of Measurement: 36 cm From Medial Instep 46 cm cm Ankle Left: Right: Point of Measurement: 12  cm From Medial Instep 36 cm cm Vascular Assessment Pulses: Dorsalis Pedis Palpable: [Left:Yes] Electronic Signature(s) Signed: 12/27/2019 4:40:05 PM By: [name] Entered By: [name] on 12/27/2019 08:49:08 [name], [name] -------------------------------------------------------------------------------- Multi Wound Chart Details Patient Name: [name]. Date of Service: 12/27/2019 8:30 AM Medical Record Number: [id number] Patient Account Number: [id number] Date of Birth/Sex: Oct 24, 1951 (69 y.o. M) Treating RN: [name] Primary Care [name]: [name] Other Clinician: Referring [name]: [name] Treating [name]/Extender: [name], [name] in Treatment: 13 Vital Signs Height(in): 69 Pulse(bpm): 96 Weight(lbs): [id number] Blood Pressure(mmHg): 155/47 Body Mass Index(BMI):  44 Temperature(F): 98.1 Respiratory Rate 18 (breaths/min): Wound Assessments Treatment Notes Electronic Signature(s) Signed: 12/27/2019 11:47:46 AM By: [name] Entered By: [name] on 12/27/2019 08:55:40 [name], [name] -------------------------------------------------------------------------------- Multi-Disciplinary Care Plan Details Patient Name: [name], [name]. Date of Service: 12/27/2019 8:30 AM Medical Record Number: [id number] Patient Account Number: [id number] Date of Birth/Sex: Nov 07, 1951 (69 y.o. M) Treating RN: [name] Primary Care [name]: [name] Other Clinician: Referring [name]: [name] Treating [name]/Extender: [name], [name] in Treatment: 13 Active Inactive Nutrition Nursing Diagnoses: Impaired glucose control: actual or potential Goals: Patient/caregiver verbalizes understanding of need to maintain therapeutic glucose control per primary care physician Date Initiated: 09/27/2019 Target Resolution Date: 10/19/2019 Goal Status: Active Interventions: Assess HgA1c results as ordered  upon admission and as needed Notes: Wound/Skin Impairment Nursing Diagnoses: Impaired tissue integrity Goals: Ulcer/skin breakdown will have a volume reduction of 30% by week 4 Date Initiated: 09/27/2019 Target Resolution Date: 10/25/2019 Goal Status: Active Interventions: Assess ulceration(s) every visit Notes: Electronic Signature(s) Signed: 12/27/2019 11:47:46 AM By: [name] Entered By: [name] on 12/27/2019 08:55:22 [name], [name] -------------------------------------------------------------------------------- Non-Wound Condition Assessment Details Patient Name: [name]. Date of Service: 12/27/2019 8:30 AM Medical Record Number: [id number] Patient Account Number: [id number] Date of Birth/Sex: 11-11-1951 (69 y.o. M) Treating RN: [name] Primary Care Edward Trevino: [name] Other Clinician: Referring Oddie Bottger: [name] Treating Catlynn Grondahl/Extender: STONE III, [name] in Treatment: 13 Non-Wound Condition: Condition: Lymphedema Location: Leg Side: Bilateral Photos Electronic Signature(s) Signed: 12/27/2019 4:40:05 PM By: [name] Entered By: [name] on 12/27/2019 08:48:12 [name], [name] -------------------------------------------------------------------------------- Pain Assessment Details Patient Name: [name]. Date of Service: 12/27/2019 8:30 AM Medical Record Number: [id number] Patient Account Number: [id number] Date of Birth/Sex: [birth year] (69 y.o. M) Treating RN: [name] Primary Care Colbie Danner: [name] Other Clinician: Referring Delson Dulworth: [name] Treating Vashon Arch/Extender: [name], [name] in Treatment: 13 Active Problems Location of Pain Severity and Description of Pain Patient Has Paino Yes Site Locations Rate the pain. Current Pain Level: 1 Pain Management and Medication Current Pain Management: Electronic Signature(s) Signed: 12/27/2019 11:32:19 AM  By: [name] RCP, RRT, CHT Signed: 12/27/2019 11:47:46 AM By: [name] Entered By: [name] on 12/27/2019 08:42:16 [name], [name] -------------------------------------------------------------------------------- Patient/Caregiver Education Details Patient Name: [name]. Date of Service: 12/27/2019 8:30 AM Medical Record Number: [id number] Patient Account Number: [id number] Date of Birth/Gender: [birth year] (69 y.o. M) Treating RN: [name] Primary Care Physician: [name] Other Clinician: Referring Physician: Aura [name] Treating Physician/Extender: Sharalyn Ink in Treatment: 13 Education Assessment Education Provided To: Patient Education Topics Provided Wound/Skin Impairment: Handouts: Caring for Your Ulcer Methods: Demonstration, Explain/Verbal Responses: State content correctly Electronic Signature(s) Signed: 12/27/2019 11:47:46 AM By: [name] Entered By: [name] on 12/27/2019 09:00:23 [name], [name] -------------------------------------------------------------------------------- Vitals Details Patient Name: [name]. Date of Service: 12/27/2019 8:30 AM Medical Record Number: [id number] Patient Account Number: [id number] Date of Birth/Sex: 06/26/1951 (69 y.o. M) Treating RN: [name] Primary Care Berkeley Veldman: [name] Other Clinician: Referring Aysiah Jurado: [name] Treating Shakeema Lippman/Extender: [name], [name] in Treatment: 13 Vital Signs Time Taken: 08:40 Temperature (F): 98.1 Height (in): 69 Pulse (bpm): 96 Weight (lbs): 295 Respiratory Rate (breaths/min): 18 Body Mass Index (BMI): 43.6 Blood Pressure (mmHg): 155/47 Reference Range: 80 - 120 mg / dl Electronic Signature(s) Signed: 12/27/2019 11:32:19 AM By: [name] RCP, RRT, CHT Entered By: [name] on 12/27/2019  08:43:17 

## 2019-12-28 NOTE — Progress Notes (Signed)
RAJAN, LESER ([id number]) Visit Report for 12/27/2019 Chief Complaint Document Details Patient Name: [name], [name]. Date of Service: 12/27/2019 8:30 AM Medical Record Number: [id number] Patient Account Number: [id number] Date of Birth/Sex: 1951/03/28 (69 y.o. M) Treating RN: Army Melia Primary Care Provider: Aura Dials Other Clinician: Referring Provider: Aura Dials Treating Provider/Extender: Melburn Hake, Praveen Coia Weeks in Treatment: 13 Information Obtained from: Patient Chief Complaint Bilateral LE lymphedema left leg cellulitis Electronic Signature(s) Signed: 12/27/2019 6:17:56 PM By: Worthy Keeler PA-C Entered By: Worthy Keeler on 12/27/2019 08:53:47 Hedges, [name] -------------------------------------------------------------------------------- HPI Details Patient Name: [name]. Date of Service: 12/27/2019 8:30 AM Medical Record Number: [id number] Patient Account Number: [id number] Date of Birth/Sex: 26-Apr-1951 (69 y.o. M) Treating RN: Army Melia Primary Care Provider: Aura Dials Other Clinician: Referring Provider: Aura Dials Treating Provider/Extender: Melburn Hake, Gor Vestal Weeks in Treatment: 13 History of Present Illness HPI Description: 09/27/2019 on evaluation today patient appears to be doing poorly in regard to his bilateral lower extremities he does have bilateral lower extremity lymphedema. With that being said he also has a history of diabetes type 2, hypertension, congestive heart failure, and apparently has chronic venous insufficiency which has led to the lymphedema. Currently he also shows signs of an infection of the left lower extremity which does have me concerned at this point as well. He tells me that he is not really having any significant pain but has had a lot of trouble with his legs in general. He does have a compression stocking he is wearing on the right lower extremity he was wrapped with an Unna boot on the left  lower extremity. The patient tells me that overall he has been doing with this for several weeks and home health has been initiated. He has not been on any antibiotics yet and he tells me that the home health nurse was not sure that it was infected based on what I see today I am concerned that this likely is infected at this point. Fortunately however there does not appear to be any evidence of systemic infection which is good news. The Unna boot does seem to be helping control the edema although he may benefit from a stronger compression wrap I think that at this point without having the ABIs completed the Unna boot is where I would stand. He is supposed to be having an arterial study with ABI as ordered by his cardiologist but he states he may have missed that appointment and has not called to reschedule he needs to contact them to get this rescheduled as soon as possible I explained to him. He tells me that he is draining a lot as far as the left lower extremity is concerned and that is something that he feels like is not lasting very long as far as the wraps are concerned when they put him on they seem to get wet extremely quickly. 10/04/2019 on evaluation today patient appears to be doing better compared to last week's evaluation. Fortunately there is no signs of active infection at this time. He has been tolerating the dressing changes without complication. He did undergo arterial studies yesterday and has an appointment tomorrow with the vascular specialist. With that being said his ABI on the left was 1.25 on the right 1.19 with a TBI of 0.61 on the left and 0.69 on the right. There was stated to be no evidence of obvious significant arterial disease and the patient had pretty much triphasic blood flow throughout. Overall  I am very pleased with how things seem to be progressing. We will see if the vascular doctor says anything different tomorrow but I even at this point I feel like he would  do well with a stronger compression wrap to try to get some the fluid out of his leg at this point. The good news is his drainage has slowed down considerably I do believe the antibiotics have been beneficial for him. 10/18/2019 on evaluation today patient appears to be doing poorly in regard to his left lower extremity. He still having a lot of drainage and weeping from his leg this appears to be much more erythematous and in fact I am concerned that the erythema may be spreading up his leg as well. He has not experienced any fevers, chills, nausea, vomiting, or diarrhea up to this point. I explained to him that I really feel like based on what I am seeing he may need to potentially go to the hospital for evaluation and treatment potentially even with IV antibiotics depending on what they see. With that being said he tells me that he is not really able to do that due to the fact that he is the primary caregiver for his wife who is legally blind. He states he is not good to be able to have anything to help as far as caring for her if he were to go into the hospital. Nonetheless he is unsure what to do in this regard. I really think that he needs faster treatment. For that reason I am going to likely try to get in touch with the infectious disease office to see if I can get him in sooner than [place]. 10/25/2019 on evaluation today patient appears to be doing a little better in regard to his weeping and edema compared to the last time I saw him. The dorsal surface of his foot is a little bit drier compared to what was this is good news. He did see Dr. Drucilla Schmidt on 10/22/2019. Dr. Drucilla Schmidt did feel that potentially this could be secondary to some cellulitis and he recommended subsequently placing the patient on Zyvox which the patient feels like has helped as well. There does not appear to be any signs of active infection at this time which is good news systemically. I am very pleased that Dr. Drucilla Schmidt was  able to see him so quickly I do appreciate this greatly. With that being said he also did recommend an MRI for the patient to evaluate for any deeper signs of infection. That is actually scheduled for this coming Tuesday. He also has a follow-up appoint with Dr. Drucilla Schmidt on Tuesday. The last thing Dr. Drucilla Schmidt did mention was the possibility of more aggressive diuresis. The patient is on torsemide. With that being said this is managed by his cardiologist. I think we may want to get in touch with her clinic and see if possibly this is something that could be increased for a short amount of time depending on their recommendations to see if we can get some additional fluid off of him which in turn would likely help tremendously with his [name], [name]. ([id number]) weeping and edema. He is in agreement with this plan if his cardiologist is in agreement. 11/01/2019 on evaluation today patient unfortunately notes that he did not get his MRI done which was ordered by Dr. Drucilla Schmidt. Apparently when he went in it was 6:30 in the morning and they told him that he was there to get an MRI of  his toes. With that being said the MRI was ordered of his ankle and foot and so long story short the patient said that he was not getting MRI of his toes and left. Dr. Drucilla Schmidt was alerted and again it does not appear that at all that was what he ordered that he did order the ankle and foot but nonetheless that is beside the point at this time. The patient still has considerable edema noted at this time the nurse that came out last after just put a Kerlix and Coban wrap on the patient she did not even properly wrap him. Subsequently they have also discharged him being that they state he is not skilled at this point. Therefore he is going to have to be seen here in the office only. Fortunately there is no signs of systemic infection he still has a lot of erythema around the ankle and lower extremity region again there are any  specific open wounds just general weeping and evidence of erythema which Dr. Drucilla Schmidt is questioning whether or not this is even infection or if it is just more secondary to the patient's lymphedema. He has had venous reflux studies which were negative for any abnormal findings unfortunately that is also not good to be an answer for [place] here. I have recommended the patient today contact his primary care provider to see if they can increase his fluid pills and I also think we need to increase his compression wrap to a 4-layer compression wrap to try to more effectively manage his edema. 11/08/2019 on evaluation today patient appears unfortunately to still be doing poorly. I did speak with his primary care provider office last week they were supposed to call him to advise what should be done as far as his fluid pills were concerned. With that being said they did not contact him as far as the patient tells me. I am not really sure exactly what is going on as far as that is concerned. Nonetheless I really think that the patient needs to be admitted to the hospital for treatment likely as I explained to him he is going to require IV Lasix to get fluid off and to be honest also think he may need IV antibiotics. I have made all the outpatient referrals I can to try to get things under control he has been on oral antibiotics I really just have not seen any significant improvement he was even on linezolid. That was prescribed By infectious disease. Overall I am very concerned about the fact that this is not getting better. 11/22/2019 on evaluation today patient unfortunately is not doing very well with regard to his lower extremity edema. The left lower extremity is significantly swollen and still significantly erythematous. He has bright green drainage. He did just see Dr. Drucilla Schmidt on 11/19/2019 and Dr. Drucilla Schmidt did not feel like that this was infected. Subsequently he states that he feels like the issue is more 1 of  not being at maximal diuresis and recommended the patient see his cardiologist to ensure that this was achieved. Subsequently Dr. Drucilla Schmidt states this is a issue with venous stasis dermatitis which again I completely understand that that may definitely be the case but unfortunately we've not been able to get things under control even with maximum compression at a 4 layer compression wrap. The patient is still having significant issues. Either way I do think the patient is getting need to follow-up with his cardiologist to see what we can do Dr. Drucilla Schmidt  did make mention that the patient had a flareup of infection that Keflex 500 mg 4 times a day or Augmentin twice a day would be good options for nonpurulent cellulitis. The patient was again supposed to have gone to the hospital on the weekend following Christmas after he got things settled with family as far as getting someone to take care of his wife. With that being said he tells me that when it came around to that time he just didn't think about it. When asked him about why the drainage from his leg and the issues he is having that and remind him he told me "well it really hasn't changed". 11/29/2019 on evaluation today patient presents for follow-up concerning his left lower extremity edema and weeping. He did see his cardiologist at my request after I discussed with him the situation as well. This is Binnie Kand who is a family Designer, jewellery that he saw. Subsequently the patient was recommended to start metolazone 5 mg daily for the next 5 days. They also recommended that really he needed essentially bed rest with elevation of his legs over the next week. With that being said the patient states that he is unable to even get in bed due to the fact that he has back trouble and has to sleep in his recliner. He never really gets in the bed at all. This is probably part of the issue coupled with his heart not functioning quite as well as what we  would like to see and then subsequently he also has the issue of being overweight which contributes to this as well. Overall were still trying to figure out how to best manage this to get his tweaking under control and prevent anything from worsening. Again Dr. Drucilla Schmidt really did not feel like there was any evidence of infectious processes going on at this point. 12/06/2019 on evaluation today patient's leg actually appears to be doing better with regard to edema control I am actually pleased in this regard. He also seems to be improving as far as drainage is concerned from the leg this is much better than what it was in the past. Fortunately there is no signs of active infection which is good news and overall the patient states he is actually happier with where things stand at this point. 12/13/2019 on evaluation today patient unfortunately is very dry with cracked skin noted at this point. Fortunately there is no signs of active infection at this time. With that being said I think he is really needs some moisturizing lotion something really good to counter loosen up the dry cracked skin I think even if 40% urea cream would be ideal at this point. I discussed this with him today. In fact even look this up on his phone so that he can order it if he needs to although he may be able to find it [name], [name] ([id number]) locally I am just not sure. I told him to check with his pharmacy first. 12/20/2019 on evaluation today patient unfortunately continues to have issues with swelling of his left lower extremity. We did use Tubigrip last week to try to give him a chance to be able to take this off and take a shower and then reapply it. With that being said he just cut it off and then did not have anything to reapply he did that on Friday after we saw him on Thursday and had nothing on in the interim for almost an entire week since.  Obviously that was not the direction that was given to him and when I  asked him about it he stated that he just could not bend over to get off and he does not have anyone to help him. With that being said I do not think this is can be possible to have him take this off to take a shower simply due to the fact that again were not going to be able to keep the edema under control and he can get this off and back on it hurts his back way too much. 12/27/2019 on evaluation today patient actually appears to be mostly dry in regard to his lower extremity wounds in general. Fortunately there is no signs of active infection at this time. No fever chills noted. I really do not see anything open at this point which is good news. He does have a lot of dry skin that is flaking off. With that being said I think that likely I recommend wrapping him 1 more week and we can utilize the objective coating layer from the Profore wraps in order to prevent anything from sticking to the wound bed. After that I am hopeful we will be able to get him into the compression socks that he has he supposed to bring 1 of those with him next week he also is supposed to switch out and start using the new ones on his right leg as when he has is completely worn out. Electronic Signature(s) Signed: 12/27/2019 9:26:24 AM By: Worthy Keeler PA-C Entered By: Worthy Keeler on 12/27/2019 09:26:23 [name] -------------------------------------------------------------------------------- Physical Exam Details Patient Name: [name], [name]. Date of Service: 12/27/2019 8:30 AM Medical Record Number: [id number] Patient Account Number: [id number] Date of Birth/Sex: 1951/03/06 (69 y.o. M) Treating RN: Army Melia Primary Care Provider: Aura Dials Other Clinician: Referring Provider: Aura Dials Treating Provider/Extender: STONE III, Bracy Pepper Weeks in Treatment: 13 Constitutional Obese and well-hydrated in no acute distress. Respiratory normal breathing without  difficulty. Psychiatric this patient is able to make decisions and demonstrates good insight into disease process. Alert and Oriented x 3. pleasant and cooperative. Notes Patient's wounds currently again seem to be fairly dried out there does not appear to be any signs of active infection and overall I am very pleased in this regard he does have a lot of dry skin in this over time is washing with mild soap and water in the shower would be beneficial to loosen up other than that I see no major complications Electronic Signature(s) Signed: 12/27/2019 9:26:49 AM By: Worthy Keeler PA-C Entered By: Worthy Keeler on 12/27/2019 09:26:49 [name], [name] -------------------------------------------------------------------------------- Physician Orders Details Patient Name: [name]. Date of Service: 12/27/2019 8:30 AM Medical Record Number: [id number] Patient Account Number: [id number] Date of Birth/Sex: 1951-08-13 (69 y.o. M) Treating RN: Army Melia Primary Care Provider: Aura Dials Other Clinician: Referring Provider: Aura Dials Treating Provider/Extender: Melburn Hake, Ashlynd Michna Weeks in Treatment: 50 Verbal / Phone Orders: No Diagnosis Coding ICD-10 Coding Code Description I89.0 Lymphedema, not elsewhere classified I87.2 Venous insufficiency (chronic) (peripheral) E11.628 Type 2 diabetes mellitus with other skin complications [id number] Essential (primary) hypertension I50.42 Chronic combined systolic (congestive) and diastolic (congestive) heart failure L02.416 Cutaneous abscess of left lower limb Wound Cleansing o Clean wound with Normal Saline. - in office o Cleanse wound with mild soap and water - left leg Skin Barriers/Peri-Wound Care o Moisturizing lotion Secondary Dressing o ABD pad o Other - Profor contact layer  over weeping areas Dressing Change Frequency o Change dressing every week Follow-up Appointments o Return Appointment in 1  week. Edema Control o 4-Layer Compression System - Left Lower Extremity. o Elevate legs to the level of the heart and pump ankles as often as possible Electronic Signature(s) Signed: 12/27/2019 11:47:46 AM By: Army Melia Signed: 12/27/2019 6:17:56 PM By: Worthy Keeler PA-C Entered By: Army Melia on 12/27/2019 09:00:00 [name], [name] -------------------------------------------------------------------------------- Problem List Details Patient Name: [name], [name]. Date of Service: 12/27/2019 8:30 AM Medical Record Number: [id number] Patient Account Number: [id number] Date of Birth/Sex: 03-Jun-1951 (69 y.o. M) Treating RN: Army Melia Primary Care Provider: Aura Dials Other Clinician: Referring Provider: Aura Dials Treating Provider/Extender: Melburn Hake, Rajah Tagliaferro Weeks in Treatment: 13 Active Problems ICD-10 Evaluated Encounter Code Description Active Date Today Diagnosis I89.0 Lymphedema, not elsewhere classified 09/27/2019 No Yes I87.2 Venous insufficiency (chronic) (peripheral) 09/27/2019 No Yes E11.628 Type 2 diabetes mellitus with other skin complications 123XX123 No Yes I10 Essential (primary) hypertension 09/27/2019 No Yes I50.42 Chronic combined systolic (congestive) and diastolic 123XX123 No Yes (congestive) heart failure L02.416 Cutaneous abscess of left lower limb 09/27/2019 No Yes Inactive Problems Resolved Problems Electronic Signature(s) Signed: 12/27/2019 6:17:56 PM By: Worthy Keeler PA-C Entered By: Worthy Keeler on 12/27/2019 08:53:40 Mccard, [name] -------------------------------------------------------------------------------- Progress Note Details Patient Name: [name]. Date of Service: 12/27/2019 8:30 AM Medical Record Number: [id number] Patient Account Number: [id number] Date of Birth/Sex: [birth year] (69 y.o. M) Treating RN: Army Melia Primary Care Provider: Aura Dials Other  Clinician: Referring Provider: Aura Dials Treating Provider/Extender: Melburn Hake, Anokhi Shannon Weeks in Treatment: 13 Subjective Chief Complaint Information obtained from Patient Bilateral LE lymphedema left leg cellulitis History of Present Illness (HPI) 09/27/2019 on evaluation today patient appears to be doing poorly in regard to his bilateral lower extremities he does have bilateral lower extremity lymphedema. With that being said he also has a history of diabetes type 2, hypertension, congestive heart failure, and apparently has chronic venous insufficiency which has led to the lymphedema. Currently he also shows signs of an infection of the left lower extremity which does have me concerned at this point as well. He tells me that he is not really having any significant pain but has had a lot of trouble with his legs in general. He does have a compression stocking he is wearing on the right lower extremity he was wrapped with an Unna boot on the left lower extremity. The patient tells me that overall he has been doing with this for several weeks and home health has been initiated. He has not been on any antibiotics yet and he tells me that the home health nurse was not sure that it was infected based on what I see today I am concerned that this likely is infected at this point. Fortunately however there does not appear to be any evidence of systemic infection which is good news. The Unna boot does seem to be helping control the edema although he may benefit from a stronger compression wrap I think that at this point without having the ABIs completed the Unna boot is where I would stand. He is supposed to be having an arterial study with ABI as ordered by his cardiologist but he states he may have missed that appointment and has not called to reschedule he needs to contact them to get this rescheduled as soon as possible I explained to him. He tells me that he is draining a lot as  far as the  left lower extremity is concerned and that is something that he feels like is not lasting very long as far as the wraps are concerned when they put him on they seem to get wet extremely quickly. 10/04/2019 on evaluation today patient appears to be doing better compared to last week's evaluation. Fortunately there is no signs of active infection at this time. He has been tolerating the dressing changes without complication. He did undergo arterial studies yesterday and has an appointment tomorrow with the vascular specialist. With that being said his ABI on the left was 1.25 on the right 1.19 with a TBI of 0.61 on the left and 0.69 on the right. There was stated to be no evidence of obvious significant arterial disease and the patient had pretty much triphasic blood flow throughout. Overall I am very pleased with how things seem to be progressing. We will see if the vascular doctor says anything different tomorrow but I even at this point I feel like he would do well with a stronger compression wrap to try to get some the fluid out of his leg at this point. The good news is his drainage has slowed down considerably I do believe the antibiotics have been beneficial for him. 10/18/2019 on evaluation today patient appears to be doing poorly in regard to his left lower extremity. He still having a lot of drainage and weeping from his leg this appears to be much more erythematous and in fact I am concerned that the erythema may be spreading up his leg as well. He has not experienced any fevers, chills, nausea, vomiting, or diarrhea up to this point. I explained to him that I really feel like based on what I am seeing he may need to potentially go to the hospital for evaluation and treatment potentially even with IV antibiotics depending on what they see. With that being said he tells me that he is not really able to do that due to the fact that he is the primary caregiver for his wife who is legally blind.  He states he is not good to be able to have anything to help as far as caring for her if he were to go into the hospital. Nonetheless he is unsure what to do in this regard. I really think that he needs faster treatment. For that reason I am going to likely try to get in touch with the infectious disease office to see if I can get him in sooner than [place]. 10/25/2019 on evaluation today patient appears to be doing a little better in regard to his weeping and edema compared to the last time I saw him. The dorsal surface of his foot is a little bit drier compared to what was this is good news. He did see Dr. Drucilla Schmidt on 10/22/2019. Dr. Drucilla Schmidt did feel that potentially this could be secondary to some cellulitis and he recommended subsequently placing the patient on Zyvox which the patient feels like has helped as well. There does not appear to be any signs of active infection at this time which is good news systemically. I am very pleased that Dr. Luther Parody, [facility] M. ([id number]) was able to see him so quickly I do appreciate this greatly. With that being said he also did recommend an MRI for the patient to evaluate for any deeper signs of infection. That is actually scheduled for this coming Tuesday. He also has a follow-up appoint with Dr. Drucilla Schmidt on Tuesday. The last thing  Dr. Drucilla Schmidt did mention was the possibility of more aggressive diuresis. The patient is on torsemide. With that being said this is managed by his cardiologist. I think we may want to get in touch with her clinic and see if possibly this is something that could be increased for a short amount of time depending on their recommendations to see if we can get some additional fluid off of him which in turn would likely help tremendously with his weeping and edema. He is in agreement with this plan if his cardiologist is in agreement. 11/01/2019 on evaluation today patient unfortunately notes that he did not get his MRI done which  was ordered by Dr. Drucilla Schmidt. Apparently when he went in it was 6:30 in the morning and they told him that he was there to get an MRI of his toes. With that being said the MRI was ordered of his ankle and foot and so long story short the patient said that he was not getting MRI of his toes and left. Dr. Drucilla Schmidt was alerted and again it does not appear that at all that was what he ordered that he did order the ankle and foot but nonetheless that is beside the point at this time. The patient still has considerable edema noted at this time the nurse that came out last after just put a Kerlix and Coban wrap on the patient she did not even properly wrap him. Subsequently they have also discharged him being that they state he is not skilled at this point. Therefore he is going to have to be seen here in the office only. Fortunately there is no signs of systemic infection he still has a lot of erythema around the ankle and lower extremity region again there are any specific open wounds just general weeping and evidence of erythema which Dr. Drucilla Schmidt is questioning whether or not this is even infection or if it is just more secondary to the patient's lymphedema. He has had venous reflux studies which were negative for any abnormal findings unfortunately that is also not good to be an answer for [place] here. I have recommended the patient today contact his primary care provider to see if they can increase his fluid pills and I also think we need to increase his compression wrap to a 4-layer compression wrap to try to more effectively manage his edema. 11/08/2019 on evaluation today patient appears unfortunately to still be doing poorly. I did speak with his primary care provider office last week they were supposed to call him to advise what should be done as far as his fluid pills were concerned. With that being said they did not contact him as far as the patient tells me. I am not really sure exactly what is going on  as far as that is concerned. Nonetheless I really think that the patient needs to be admitted to the hospital for treatment likely as I explained to him he is going to require IV Lasix to get fluid off and to be honest also think he may need IV antibiotics. I have made all the outpatient referrals I can to try to get things under control he has been on oral antibiotics I really just have not seen any significant improvement he was even on linezolid. That was prescribed By infectious disease. Overall I am very concerned about the fact that this is not getting better. 11/22/2019 on evaluation today patient unfortunately is not doing very well with regard to his lower extremity edema.  The left lower extremity is significantly swollen and still significantly erythematous. He has bright green drainage. He did just see Dr. Drucilla Schmidt on 11/19/2019 and Dr. Drucilla Schmidt did not feel like that this was infected. Subsequently he states that he feels like the issue is more 1 of not being at maximal diuresis and recommended the patient see his cardiologist to ensure that this was achieved. Subsequently Dr. Drucilla Schmidt states this is a issue with venous stasis dermatitis which again I completely understand that that may definitely be the case but unfortunately we've not been able to get things under control even with maximum compression at a 4 layer compression wrap. The patient is still having significant issues. Either way I do think the patient is getting need to follow-up with his cardiologist to see what we can do Dr. Drucilla Schmidt did make mention that the patient had a flareup of infection that Keflex 500 mg 4 times a day or Augmentin twice a day would be good options for nonpurulent cellulitis. The patient was again supposed to have gone to the hospital on the weekend following Christmas after he got things settled with family as far as getting someone to take care of his wife. With that being said he tells me that when it  came around to that time he just didn't think about it. When asked him about why the drainage from his leg and the issues he is having that and remind him he told me "well it really hasn't changed". 11/29/2019 on evaluation today patient presents for follow-up concerning his left lower extremity edema and weeping. He did see his cardiologist at my request after I discussed with him the situation as well. This is Binnie Kand who is a family Designer, jewellery that he saw. Subsequently the patient was recommended to start metolazone 5 mg daily for the next 5 days. They also recommended that really he needed essentially bed rest with elevation of his legs over the next week. With that being said the patient states that he is unable to even get in bed due to the fact that he has back trouble and has to sleep in his recliner. He never really gets in the bed at all. This is probably part of the issue coupled with his heart not functioning quite as well as what we would like to see and then subsequently he also has the issue of being overweight which contributes to this as well. Overall were still trying to figure out how to best manage this to get his tweaking under control and prevent anything from worsening. Again Dr. Drucilla Schmidt really did not feel like there was any evidence of infectious processes going on at this point. 12/06/2019 on evaluation today patient's leg actually appears to be doing better with regard to edema control I am actually pleased in this regard. He also seems to be improving as far as drainage is concerned from the leg this is much better than what it was in the past. Fortunately there is no signs of active infection which is good news and overall the patient states he is [name], EWERT. ([id number]) actually happier with where things stand at this point. 12/13/2019 on evaluation today patient unfortunately is very dry with cracked skin noted at this point. Fortunately there is  no signs of active infection at this time. With that being said I think he is really needs some moisturizing lotion something really good to counter loosen up the dry cracked skin I think even if 40%  urea cream would be ideal at this point. I discussed this with him today. In fact even look this up on his phone so that he can order it if he needs to although he may be able to find it locally I am just not sure. I told him to check with his pharmacy first. 12/20/2019 on evaluation today patient unfortunately continues to have issues with swelling of his left lower extremity. We did use Tubigrip last week to try to give him a chance to be able to take this off and take a shower and then reapply it. With that being said he just cut it off and then did not have anything to reapply he did that on Friday after we saw him on Thursday and had nothing on in the interim for almost an entire week since. Obviously that was not the direction that was given to him and when I asked him about it he stated that he just could not bend over to get off and he does not have anyone to help him. With that being said I do not think this is can be possible to have him take this off to take a shower simply due to the fact that again were not going to be able to keep the edema under control and he can get this off and back on it hurts his back way too much. 12/27/2019 on evaluation today patient actually appears to be mostly dry in regard to his lower extremity wounds in general. Fortunately there is no signs of active infection at this time. No fever chills noted. I really do not see anything open at this point which is good news. He does have a lot of dry skin that is flaking off. With that being said I think that likely I recommend wrapping him 1 more week and we can utilize the objective coating layer from the Profore wraps in order to prevent anything from sticking to the wound bed. After that I am hopeful we will be able  to get him into the compression socks that he has he supposed to bring 1 of those with him next week he also is supposed to switch out and start using the new ones on his right leg as when he has is completely worn out. Objective Constitutional Obese and well-hydrated in no acute distress. Vitals Time Taken: 8:40 AM, Height: 69 in, Weight: 295 lbs, BMI: 43.6, Temperature: 98.1 F, Pulse: 96 bpm, Respiratory Rate: 18 breaths/min, Blood Pressure: 155/47 mmHg. Respiratory normal breathing without difficulty. Psychiatric this patient is able to make decisions and demonstrates good insight into disease process. Alert and Oriented x 3. pleasant and cooperative. General Notes: Patient's wounds currently again seem to be fairly dried out there does not appear to be any signs of active infection and overall I am very pleased in this regard he does have a lot of dry skin in this over time is washing with mild soap and water in the shower would be beneficial to loosen up other than that I see no major complications Other Condition(s) Patient presents with Lymphedema located on the Bilateral Leg. 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 Post procedure  Diagnosis Wound #: Same as Pre-Procedure Plan Wound Cleansing: Clean wound with Normal Saline. - in office Cleanse wound with mild soap and water - left leg Skin Barriers/Peri-Wound Care: Moisturizing lotion Secondary Dressing: ABD pad Other - Profor contact layer over weeping areas Dressing Change Frequency: Change  dressing every week Follow-up Appointments: Return Appointment in 1 week. Edema Control: 4-Layer Compression System - Left Lower Extremity. Elevate legs to the level of the heart and pump ankles as often as possible 1. I would recommend currently we continue with a 4-layer compression wrap we will use a protective dressing over top of this to prevent from sticking along with ABD pads other than that I do not believe that we need the Xtrasorb any longer. 2. I am also going to suggest that we continue with the elevation I think that is can be best for him to help with edema control. 3. He should still wear his compression socks on the right lower extremity for now he needs to start wearing the new ones as the old one is worn out he also needs to bring 1 with the next time is may be transitioning into that at that point. We will see patient back for reevaluation in 1 week here in the clinic. If anything worsens or changes patient will contact our office for additional recommendations. [name], [name] Electronic Signature(s) Signed: 12/27/2019 9:27:31 AM By: Worthy Keeler PA-C Entered By: Worthy Keeler on 12/27/2019 09:27:30 [name], [name] -------------------------------------------------------------------------------- SuperBill Details Patient Name: [name]. Date of Service: 12/27/2019 Medical Record Number: [id number] Patient Account Number: [id number] Date of Birth/Sex: 01-Nov-1951 (69 y.o. 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 [id number] Essential (primary) hypertension I50.42 Chronic combined systolic  (congestive) and diastolic (congestive) heart failure L02.416 Cutaneous abscess of left lower limb Facility Procedures CPT4 Code: [id number] Description: (Facility Use Only) [phone] - [place] [facility] LT LEG Modifier: Quantity: 1 Physician Procedures CPT4 Code: [id number] Description: 99213 - WC PHYS LEVEL 3 - EST PT ICD-10 Diagnosis Description I89.0 Lymphedema, not elsewhere classified I87.2 Venous insufficiency (chronic) (peripheral) E11.628 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

## 2020-01-03 ENCOUNTER — Encounter: Payer: Medicare Other | Admitting: Physician Assistant

## 2020-01-04 NOTE — Progress Notes (Signed)
JAYMIR, SLASKI ([id number]) Visit Report for 12/13/2019 Arrival Information Details Patient Name: [name], [name]. Date of Service: 12/13/2019 8:30 AM Medical Record Number: [id number] Patient Account Number: [id number] Date of Birth/Sex: 28-May-1951 (69 y.o. M) Treating RN: Cornell Barman Primary Care Halina Asano: Aura Dials Other Clinician: Referring Armida Vickroy: Aura Dials Treating Yennifer Segovia/Extender: Melburn Hake, HOYT Weeks in Treatment: 11 Visit Information History Since Last Visit Added or deleted any medications: No Patient Arrived: Ambulatory Has Dressing in Place as Prescribed: Yes Arrival Time: 08:36 Has Compression in Place as Prescribed: Yes Accompanied By: self Pain Present Now: No Transfer Assistance: None Patient Identification Verified: Yes Secondary Verification Process Yes Completed: Patient Requires Transmission-Based No Precautions: Patient Has Alerts: Yes Patient Alerts: DM II ABI11/18/20 L 1.25 R 1.19 Electronic Signature(s) Signed: 01/04/2020 11:50:22 AM By: Gretta Cool, BSN, RN, CWS, Kim RN, BSN Entered By: Gretta Cool, BSN, RN, CWS, Kim on 12/13/2019 08:36:19 Cobbins, [name] ([id number]) -------------------------------------------------------------------------------- Clinic Level of Care Assessment Details Patient Name: [name], [name]. Date of Service: 12/13/2019 8:30 AM Medical Record Number: [id number] Patient Account Number: [id number] Date of Birth/Sex: Jan 01, 1951 (69 y.o. M) Treating RN: Army Melia Primary Care Edrik Rundle: Aura Dials Other Clinician: Referring Okla Qazi: Aura Dials Treating Izea Livolsi/Extender: Melburn Hake, HOYT Weeks in Treatment: 11 Clinic Level of Care Assessment Items TOOL 4 Quantity Score []  - Use when only an EandM is performed on FOLLOW-UP visit 0 ASSESSMENTS - Nursing Assessment / Reassessment X - Reassessment of Co-morbidities (includes updates in patient status) 1 10 X- 1 5 Reassessment of Adherence to Treatment  Plan ASSESSMENTS - Wound and Skin Assessment / Reassessment X - Simple Wound Assessment / Reassessment - one wound 1 5 []  - 0 Complex Wound Assessment / Reassessment - multiple wounds []  - 0 Dermatologic / Skin Assessment (not related to wound area) ASSESSMENTS - Focused Assessment X - Circumferential Edema Measurements - multi extremities 1 5 []  - 0 Nutritional Assessment / Counseling / Intervention X- 1 5 Lower Extremity Assessment (monofilament, tuning fork, pulses) []  - 0 Peripheral Arterial Disease Assessment (using hand held doppler) ASSESSMENTS - Ostomy and/or Continence Assessment and Care []  - Incontinence Assessment and Management 0 []  - 0 Ostomy Care Assessment and Management (repouching, etc.) PROCESS - Coordination of Care X - Simple Patient / Family Education for ongoing care 1 15 []  - 0 Complex (extensive) Patient / Family Education for ongoing care X- 1 10 Staff obtains Programmer, systems, Records, Test Results / Process Orders []  - 0 Staff telephones HHA, Nursing Homes / Clarify orders / etc []  - 0 Routine Transfer to another Facility (non-emergent condition) []  - 0 Routine Hospital Admission (non-emergent condition) []  - 0 New Admissions / Biomedical engineer / Ordering NPWT, Apligraf, etc. []  - 0 Emergency Hospital Admission (emergent condition) X- 1 10 Simple Discharge Coordination [name], [name]. ([id number]) []  - 0 Complex (extensive) Discharge Coordination PROCESS - Special Needs []  - Pediatric / Minor Patient Management 0 []  - 0 Isolation Patient Management []  - 0 Hearing / Language / Visual special needs []  - 0 Assessment of Community assistance (transportation, D/C planning, etc.) []  - 0 Additional assistance / Altered mentation []  - 0 Support Surface(s) Assessment (bed, cushion, seat, etc.) INTERVENTIONS - Wound Cleansing / Measurement X - Simple Wound Cleansing - one wound 1 5 []  - 0 Complex Wound Cleansing - multiple wounds X- 1  5 Wound Imaging (photographs - any number of wounds) []  - 0 Wound Tracing (instead of photographs) X- 1 5 Simple Wound Measurement - one wound []  -  0 Complex Wound Measurement - multiple wounds INTERVENTIONS - Wound Dressings []  - Small Wound Dressing one or multiple wounds 0 X- 1 15 Medium Wound Dressing one or multiple wounds []  - 0 Large Wound Dressing one or multiple wounds []  - 0 Application of Medications - topical []  - 0 Application of Medications - injection INTERVENTIONS - Miscellaneous []  - External ear exam 0 []  - 0 Specimen Collection (cultures, biopsies, blood, body fluids, etc.) []  - 0 Specimen(s) / Culture(s) sent or taken to Lab for analysis []  - 0 Patient Transfer (multiple staff / Civil Service fast streamer / Similar devices) []  - 0 Simple Staple / Suture removal (25 or less) []  - 0 Complex Staple / Suture removal (26 or more) []  - 0 Hypo / Hyperglycemic Management (close monitor of Blood Glucose) []  - 0 Ankle / Brachial Index (ABI) - do not check if billed separately X- 1 5 Vital Signs Borin, Claron M. ([id number]) Has the patient been seen at the hospital within the last three years: Yes Total Score: 100 Level Of Care: New/Established - Level 3 Electronic Signature(s) Signed: 12/13/2019 11:58:35 AM By: Army Melia Entered By: Army Melia on 12/13/2019 09:02:09 Priebe, [name] ([id number]) -------------------------------------------------------------------------------- Encounter Discharge Information Details Patient Name: [name]. Date of Service: 12/13/2019 8:30 AM Medical Record Number: [id number] Patient Account Number: [id number] Date of Birth/Sex: 1951/09/15 (69 y.o. M) Treating RN: Army Melia Primary Care Eveleen Mcnear: Aura Dials Other Clinician: Referring Sostenes Kauffmann: Aura Dials Treating Jeneen Doutt/Extender: Melburn Hake, HOYT Weeks in Treatment: 11 Encounter Discharge Information Items Discharge Condition: Stable Ambulatory Status:  Ambulatory Discharge Destination: Home Transportation: Private Auto Accompanied By: self Schedule Follow-up Appointment: Yes Clinical Summary of Care: Electronic Signature(s) Signed: 12/13/2019 11:58:35 AM By: Army Melia Entered By: Army Melia on 12/13/2019 09:03:18 Pasquarelli, [name] ([id number]) -------------------------------------------------------------------------------- Lower Extremity Assessment Details Patient Name: [name]. Date of Service: 12/13/2019 8:30 AM Medical Record Number: [id number] Patient Account Number: [id number] Date of Birth/Sex: Jun 12, 1951 (69 y.o. M) Treating RN: Cornell Barman Primary Care Tyreesha Maharaj: Aura Dials Other Clinician: Referring Husna Krone: Aura Dials Treating September Mormile/Extender: Melburn Hake, HOYT Weeks in Treatment: 11 Edema Assessment Assessed: [Left: No] [Right: No] [Left: Edema] [Right: :] Calf Left: Right: Point of Measurement: 36 cm From Medial Instep 49 cm cm Ankle Left: Right: Point of Measurement: 12 cm From Medial Instep 29 cm cm Vascular Assessment Pulses: Dorsalis Pedis Palpable: [Left:Yes] Electronic Signature(s) Signed: 01/04/2020 11:50:22 AM By: Gretta Cool, BSN, RN, CWS, Kim RN, BSN Entered By: Gretta Cool, BSN, RN, CWS, Kim on 12/13/2019 08:47:52 Whitsell, [name] ([id number]) -------------------------------------------------------------------------------- Multi Wound Chart Details Patient Name: [name]. Date of Service: 12/13/2019 8:30 AM Medical Record Number: [id number] Patient Account Number: [id number] Date of Birth/Sex: 12/01/1950 (69 y.o. M) Treating RN: Army Melia Primary Care Breia Ocampo: Aura Dials Other Clinician: Referring Estefana Taylor: Aura Dials Treating Daysie Helf/Extender: Melburn Hake, HOYT Weeks in Treatment: 11 Vital Signs Height(in): 69 Pulse(bpm): 70 Weight(lbs): [id number] Blood Pressure(mmHg): 144/52 Body Mass Index(BMI): 44 Temperature(F): 98.9 Respiratory Rate 16 (breaths/min): Wound  Assessments Treatment Notes Electronic Signature(s) Signed: 12/13/2019 11:58:35 AM By: Army Melia Entered By: Army Melia on 12/13/2019 09:00:38 Ahrendt, [name] ([id number]) -------------------------------------------------------------------------------- [place] Details Patient Name: [name], [name]. Date of Service: 12/13/2019 8:30 AM Medical Record Number: [id number] Patient Account Number: [id number] Date of Birth/Sex: 1951-10-17 (69 y.o. M) Treating RN: Army Melia Primary Care Amal Saiki: Aura Dials Other Clinician: Referring Benton Tooker: Aura Dials Treating Galit Urich/Extender: Melburn Hake, HOYT Weeks in Treatment: 11 Active Inactive Nutrition Nursing Diagnoses: Impaired glucose  control: actual or potential Goals: Patient/caregiver verbalizes understanding of need to maintain therapeutic glucose control per primary care physician Date Initiated: 09/27/2019 Target Resolution Date: 10/19/2019 Goal Status: Active Interventions: Assess HgA1c results as ordered upon admission and as needed Notes: Orientation to the Wound Care Program Nursing Diagnoses: Knowledge deficit related to the wound healing center program Goals: Patient/caregiver will verbalize understanding of the [facility] Date Initiated: 09/27/2019 Target Resolution Date: 10/19/2019 Goal Status: Active Interventions: Provide education on orientation to the wound center Notes: Wound/Skin Impairment Nursing Diagnoses: Impaired tissue integrity Goals: Ulcer/skin breakdown will have a volume reduction of 30% by week 4 Date Initiated: 09/27/2019 Target Resolution Date: 10/25/2019 Goal Status: Active Interventions: Assess ulceration(s) every visit [name], [name] Notes: Electronic Signature(s) Signed: 12/13/2019 11:58:35 AM By: Army Melia Entered By: Army Melia on 12/13/2019 09:00:32 Birchard, [name]  ([id number]) -------------------------------------------------------------------------------- Pain Assessment Details Patient Name: [name]. Date of Service: 12/13/2019 8:30 AM Medical Record Number: [id number] Patient Account Number: [id number] Date of Birth/Sex: [birth year] (69 y.o. M) Treating RN: Cornell Barman Primary Care Odena Mcquaid: Aura Dials Other Clinician: Referring Loyal Rudy: Aura Dials Treating Luara Faye/Extender: Melburn Hake, HOYT Weeks in Treatment: 11 Active Problems Location of Pain Severity and Description of Pain Patient Has Paino No Site Locations Pain Management and Medication Current Pain Management: Notes Patient denies pain at this time. Electronic Signature(s) Signed: 01/04/2020 11:50:22 AM By: Gretta Cool, BSN, RN, CWS, Kim RN, BSN Entered By: Gretta Cool, BSN, RN, CWS, Kim on 12/13/2019 08:36:38 Farquhar, [name] ([id number]) -------------------------------------------------------------------------------- Patient/Caregiver Education Details Patient Name: [name], [name]. Date of Service: 12/13/2019 8:30 AM Medical Record Number: [id number] Patient Account Number: [id number] Date of Birth/Gender: [birth year] (69 y.o. M) Treating RN: Army Melia Primary Care Physician: Aura Dials Other Clinician: Referring Physician: Aura Dials Treating Physician/Extender: Sharalyn Ink in Treatment: 11 Education Assessment Education Provided To: Patient Education Topics Provided Wound/Skin Impairment: Handouts: Caring for Your Ulcer Methods: Demonstration, Explain/Verbal Responses: State content correctly Electronic Signature(s) Signed: 12/13/2019 11:58:35 AM By: Army Melia Entered By: Army Melia on 12/13/2019 09:02:20 Devries, [name] ([id number]) -------------------------------------------------------------------------------- Vitals Details Patient Name: [name]. Date of Service: 12/13/2019 8:30 AM Medical Record Number: [id number] Patient  Account Number: [id number] Date of Birth/Sex: Apr 17, 1951 (69 y.o. M) Treating RN: Cornell Barman Primary Care Joushua Dugar: Aura Dials Other Clinician: Referring Kebra Lowrimore: Aura Dials Treating Malie Kashani/Extender: Melburn Hake, HOYT Weeks in Treatment: 11 Vital Signs Time Taken: 08:36 Temperature (F): 98.9 Height (in): 69 Pulse (bpm): 70 Weight (lbs): 295 Respiratory Rate (breaths/min): 16 Body Mass Index (BMI): 43.6 Blood Pressure (mmHg): 144/52 Reference Range: 80 - 120 mg / dl Electronic Signature(s) Signed: 01/04/2020 11:50:22 AM By: Gretta Cool, BSN, RN, CWS, Kim RN, BSN Entered By: Gretta Cool, BSN, RN, CWS, Kim on 12/13/2019 08:36:58

## 2020-01-10 ENCOUNTER — Other Ambulatory Visit: Payer: Self-pay

## 2020-01-10 ENCOUNTER — Encounter: Payer: Medicare Other | Admitting: Physician Assistant

## 2020-01-10 DIAGNOSIS — I890 Lymphedema, not elsewhere classified: Secondary | ICD-10-CM | POA: Diagnosis not present

## 2020-01-11 NOTE — Progress Notes (Signed)
SY, SCHLOTTERBECK ([id number]) Visit Report for 01/10/2020 Chief Complaint Document Details Patient Name: [name], [name]. Date of Service: 01/10/2020 8:30 AM Medical Record Number: [id number] Patient Account Number: [id number] Date of Birth/Sex: [birth year] (69 y.o. M) Treating RN: Army Melia Primary Care Provider: Aura Dials Other Clinician: Referring Provider: Aura Dials Treating Provider/Extender: Melburn Hake, Meril Dray Weeks in Treatment: 15 Information Obtained from: Patient Chief Complaint Bilateral LE lymphedema left leg cellulitis Electronic Signature(s) Signed: 01/10/2020 9:13:00 AM By: Worthy Keeler PA-C Entered By: Worthy Keeler on 01/10/2020 09:13:00 Fissel, [name] ([id number]) -------------------------------------------------------------------------------- HPI Details Patient Name: [name]. Date of Service: 01/10/2020 8:30 AM Medical Record Number: [id number] Patient Account Number: [id number] Date of Birth/Sex: 07-Jun-1951 (69 y.o. M) Treating RN: Army Melia Primary Care Provider: Aura Dials Other Clinician: Referring Provider: Aura Dials Treating Provider/Extender: Melburn Hake, Molly Maselli Weeks in Treatment: 15 History of Present Illness HPI Description: 09/27/2019 on evaluation today patient appears to be doing poorly in regard to his bilateral lower extremities he does have bilateral lower extremity lymphedema. With that being said he also has a history of diabetes type 2, hypertension, congestive heart failure, and apparently has chronic venous insufficiency which has led to the lymphedema. Currently he also shows signs of an infection of the left lower extremity which does have me concerned at this point as well. He tells me that he is not really having any significant pain but has had a lot of trouble with his legs in general. He does have a compression stocking he is wearing on the right lower extremity he was wrapped with an Unna boot on the left  lower extremity. The patient tells me that overall he has been doing with this for several weeks and home health has been initiated. He has not been on any antibiotics yet and he tells me that the home health nurse was not sure that it was infected based on what I see today I am concerned that this likely is infected at this point. Fortunately however there does not appear to be any evidence of systemic infection which is good news. The Unna boot does seem to be helping control the edema although he may benefit from a stronger compression wrap I think that at this point without having the ABIs completed the Unna boot is where I would stand. He is supposed to be having an arterial study with ABI as ordered by his cardiologist but he states he may have missed that appointment and has not called to reschedule he needs to contact them to get this rescheduled as soon as possible I explained to him. He tells me that he is draining a lot as far as the left lower extremity is concerned and that is something that he feels like is not lasting very long as far as the wraps are concerned when they put him on they seem to get wet extremely quickly. 10/04/2019 on evaluation today patient appears to be doing better compared to last week's evaluation. Fortunately there is no signs of active infection at this time. He has been tolerating the dressing changes without complication. He did undergo arterial studies yesterday and has an appointment tomorrow with the vascular specialist. With that being said his ABI on the left was 1.25 on the right 1.19 with a TBI of 0.61 on the left and 0.69 on the right. There was stated to be no evidence of obvious significant arterial disease and the patient had pretty much triphasic blood flow throughout. Overall  I am very pleased with how things seem to be progressing. We will see if the vascular doctor says anything different tomorrow but I even at this point I feel like he would do  well with a stronger compression wrap to try to get some the fluid out of his leg at this point. The good news is his drainage has slowed down considerably I do believe the antibiotics have been beneficial for him. 10/18/2019 on evaluation today patient appears to be doing poorly in regard to his left lower extremity. He still having a lot of drainage and weeping from his leg this appears to be much more erythematous and in fact I am concerned that the erythema may be spreading up his leg as well. He has not experienced any fevers, chills, nausea, vomiting, or diarrhea up to this point. I explained to him that I really feel like based on what I am seeing he may need to potentially go to the hospital for evaluation and treatment potentially even with IV antibiotics depending on what they see. With that being said he tells me that he is not really able to do that due to the fact that he is the primary caregiver for his wife who is legally blind. He states he is not good to be able to have anything to help as far as caring for her if he were to go into the hospital. Nonetheless he is unsure what to do in this regard. I really think that he needs faster treatment. For that reason I am going to likely try to get in touch with the infectious disease office to see if I can get him in sooner than [place]. 10/25/2019 on evaluation today patient appears to be doing a little better in regard to his weeping and edema compared to the last time I saw him. The dorsal surface of his foot is a little bit drier compared to what was this is good news. He did see Dr. Drucilla Schmidt on 10/22/2019. Dr. Drucilla Schmidt did feel that potentially this could be secondary to some cellulitis and he recommended subsequently placing the patient on Zyvox which the patient feels like has helped as well. There does not appear to be any signs of active infection at this time which is good news systemically. I am very pleased that Dr. Drucilla Schmidt was able  to see him so quickly I do appreciate this greatly. With that being said he also did recommend an MRI for the patient to evaluate for any deeper signs of infection. That is actually scheduled for this coming Tuesday. He also has a follow-up appoint with Dr. Drucilla Schmidt on Tuesday. The last thing Dr. Drucilla Schmidt did mention was the possibility of more aggressive diuresis. The patient is on torsemide. With that being said this is managed by his cardiologist. I think we may want to get in touch with her clinic and see if possibly this is something that could be increased for a short amount of time depending on their recommendations to see if we can get some additional fluid off of him which in turn would likely help tremendously with his weeping and edema. He is in agreement with this plan if his cardiologist is in agreement. 11/01/2019 on evaluation today patient unfortunately notes that he did not get his MRI done which was ordered by Dr. Drucilla Schmidt. Apparently when he went in it was 6:30 in the morning and they told him that he was there to get an MRI of his toes. With that  being said the MRI was ordered of his ankle and foot and so long story short the patient said that he was not getting MRI of his toes and left. Dr. Drucilla Schmidt was alerted and again it does not appear that at all that was what he ordered that he did order the ankle and foot but nonetheless that is beside the point at this time. The patient still has considerable edema noted at this time the nurse that came out last after just put a Kerlix and Coban wrap on the patient she did not even properly wrap him. Subsequently they have also discharged him being that they state he is not skilled at this point. Therefore he is going to have to be seen here in the office only. Fortunately there is no signs of systemic infection he still has a lot of erythema around the ankle and lower extremity region again there are any specific open wounds just general weeping  and evidence of erythema which Dr. Drucilla Schmidt is questioning whether or not this is even infection or if it is just more secondary to the patient's lymphedema. He has had venous reflux studies which were negative for any abnormal findings unfortunately that is also not good to be an answer for [place] here. I have recommended the patient today contact his primary care provider to see if they can increase his fluid pills and I also think we need to increase his compression wrap to a 4-layer compression wrap to try to more effectively manage his edema. 11/08/2019 on evaluation today patient appears unfortunately to still be doing poorly. I did speak with his primary care provider office last week they were supposed to call him to advise what should be done as far as his fluid pills were concerned. With that being said they did not contact him as far as the patient tells me. I am not really sure exactly what is going on as far as that is concerned. Nonetheless I really think that the patient needs to be admitted to the hospital for treatment likely as I explained to him he is going to require IV Lasix to get fluid off and to be honest also think he may need ZORIAN, GUNDERMAN. ([id number]) IV antibiotics. I have made all the outpatient referrals I can to try to get things under control he has been on oral antibiotics I really just have not seen any significant improvement he was even on linezolid. That was prescribed By infectious disease. Overall I am very concerned about the fact that this is not getting better. 11/22/2019 on evaluation today patient unfortunately is not doing very well with regard to his lower extremity edema. The left lower extremity is significantly swollen and still significantly erythematous. He has bright green drainage. He did just see Dr. Drucilla Schmidt on 11/19/2019 and Dr. Drucilla Schmidt did not feel like that this was infected. Subsequently he states that he feels like the issue is more 1 of not being at  maximal diuresis and recommended the patient see his cardiologist to ensure that this was achieved. Subsequently Dr. Drucilla Schmidt states this is a issue with venous stasis dermatitis which again I completely understand that that may definitely be the case but unfortunately we've not been able to get things under control even with maximum compression at a 4 layer compression wrap. The patient is still having significant issues. Either way I do think the patient is getting need to follow-up with his cardiologist to see what we can do Dr. Drucilla Schmidt  did make mention that the patient had a flareup of infection that Keflex 500 mg 4 times a day or Augmentin twice a day would be good options for nonpurulent cellulitis. The patient was again supposed to have gone to the hospital on the weekend following Christmas after he got things settled with family as far as getting someone to take care of his wife. With that being said he tells me that when it came around to that time he just didn't think about it. When asked him about why the drainage from his leg and the issues he is having that and remind him he told me "well it really hasn't changed". 11/29/2019 on evaluation today patient presents for follow-up concerning his left lower extremity edema and weeping. He did see his cardiologist at my request after I discussed with him the situation as well. This is Binnie Kand who is a family Designer, jewellery that he saw. Subsequently the patient was recommended to start metolazone 5 mg daily for the next 5 days. They also recommended that really he needed essentially bed rest with elevation of his legs over the next week. With that being said the patient states that he is unable to even get in bed due to the fact that he has back trouble and has to sleep in his recliner. He never really gets in the bed at all. This is probably part of the issue coupled with his heart not functioning quite as well as what we would like to see  and then subsequently he also has the issue of being overweight which contributes to this as well. Overall were still trying to figure out how to best manage this to get his tweaking under control and prevent anything from worsening. Again Dr. Drucilla Schmidt really did not feel like there was any evidence of infectious processes going on at this point. 12/06/2019 on evaluation today patient's leg actually appears to be doing better with regard to edema control I am actually pleased in this regard. He also seems to be improving as far as drainage is concerned from the leg this is much better than what it was in the past. Fortunately there is no signs of active infection which is good news and overall the patient states he is actually happier with where things stand at this point. 12/13/2019 on evaluation today patient unfortunately is very dry with cracked skin noted at this point. Fortunately there is no signs of active infection at this time. With that being said I think he is really needs some moisturizing lotion something really good to counter loosen up the dry cracked skin I think even if 40% urea cream would be ideal at this point. I discussed this with him today. In fact even look this up on his phone so that he can order it if he needs to although he may be able to find it locally I am just not sure. I told him to check with his pharmacy first. 12/20/2019 on evaluation today patient unfortunately continues to have issues with swelling of his left lower extremity. We did use Tubigrip last week to try to give him a chance to be able to take this off and take a shower and then reapply it. With that being said he just cut it off and then did not have anything to reapply he did that on Friday after we saw him on Thursday and had nothing on in the interim for almost an entire week since. Obviously that was not  the direction that was given to him and when I asked him about it he stated that he just could not bend  over to get off and he does not have anyone to help him. With that being said I do not think this is can be possible to have him take this off to take a shower simply due to the fact that again were not going to be able to keep the edema under control and he can get this off and back on it hurts his back way too much. 12/27/2019 on evaluation today patient actually appears to be mostly dry in regard to his lower extremity wounds in general. Fortunately there is no signs of active infection at this time. No fever chills noted. I really do not see anything open at this point which is good news. He does have a lot of dry skin that is flaking off. With that being said I think that likely I recommend wrapping him 1 more week and we can utilize the objective coating layer from the Profore wraps in order to prevent anything from sticking to the wound bed. After that I am hopeful we will be able to get him into the compression socks that he has he supposed to bring 1 of those with him next week he also is supposed to switch out and start using the new ones on his right leg as when he has is completely worn out. 01/10/2020 upon evaluation today patient appears to be doing poorly at this point in regard to his lower extremities on the left. He still continues to have significant weeping he actually took the wrap off 2 days ago because he stated "we were planning to take it off today anyway". He did not however put any compression stockings on but obviously was not part of the plan. Still he continues to have significant swelling I really do not know that this is going to make a significant improvement and less were able to keep him well compressed all the time and again also get his edema under good control. He was supposed to go back to see his cardiologist although I do not think that is actually been an appointment that he has made at this point that I can find anyway. He also is still taking the double dose of  his Lasix but again I am not sure that that is doing the job here. Electronic Signature(s) Signed: 01/10/2020 1:03:03 PM By: Worthy Keeler PA-C Entered By: Worthy Keeler on 01/10/2020 13:03:03 Altemose, [name] ([id number]) -------------------------------------------------------------------------------- Physical Exam Details Patient Name: JAMEISON, ANUSZEWSKI. Date of Service: 01/10/2020 8:30 AM Medical Record Number: [id number] Patient Account Number: [id number] Date of Birth/Sex: 18-Dec-1950 (69 y.o. M) Treating RN: Army Melia Primary Care Provider: Aura Dials Other Clinician: Referring Provider: Aura Dials Treating Provider/Extender: STONE III, Arvin Abello Weeks in Treatment: 54 Constitutional Well-nourished and well-hydrated in no acute distress. Respiratory normal breathing without difficulty. Psychiatric this patient is able to make decisions and demonstrates good insight into disease process. Alert and Oriented x 3. pleasant and cooperative. Notes Upon inspection patient's left lower extremity appears to show signs of significant erythema secondary to chronic venous stasis he also has weeping although there are no actual open wounds at this point. I am concerned about the fact that he does not seem to be getting any better and in fact seems to get worse he is also not extremely compliant with anything we recommended he cannot even lay flat in  the bed he states his back hurts too badly. I really think that he needs to go to the ER for admission to the hospital for possible IV antibiotics and IV diuretics in order to try to get the fluid out and help this area to resolve. With that being said he still tells me that he has no one that can take care of his wife she apparently is blind and therefore he states that that is not good to be possible. Nonetheless I discussed with him before that things may get to the point where he has no choice and that he has to urgently go to the hospital  and that could be an even worse scenario in that regard. Electronic Signature(s) Signed: 01/10/2020 1:04:05 PM By: Worthy Keeler PA-C Entered By: Worthy Keeler on 01/10/2020 13:04:05 Craney, [name] ([id number]) -------------------------------------------------------------------------------- Physician Orders Details Patient Name: [name]. Date of Service: 01/10/2020 8:30 AM Medical Record Number: [id number] Patient Account Number: [id number] Date of Birth/Sex: [birth year] (69 y.o. M) Treating RN: Army Melia Primary Care Provider: Aura Dials Other Clinician: Referring Provider: Aura Dials Treating Provider/Extender: Melburn Hake, Dynesha Woolen Weeks in Treatment: 15 Verbal / Phone Orders: No Diagnosis Coding Wound Cleansing o Clean wound with Normal Saline. - in office o Cleanse wound with mild soap and water - left leg Skin Barriers/Peri-Wound Care o Moisturizing lotion Secondary Dressing o XtraSorb o Other - Profor contact layer over weeping areas Dressing Change Frequency o Change dressing every week Follow-up Appointments o Return Appointment in 1 week. Edema Control o 4-Layer Compression System - Left Lower Extremity. o Elevate legs to the level of the heart and pump ankles as often as possible Electronic Signature(s) Signed: 01/10/2020 11:52:49 AM By: Army Melia Signed: 01/10/2020 5:55:32 PM By: Worthy Keeler PA-C Entered By: Army Melia on 01/10/2020 08:48:25 Cinelli, [name] ([id number]) -------------------------------------------------------------------------------- Problem List Details Patient Name: MICHELLE, BOVA. Date of Service: 01/10/2020 8:30 AM Medical Record Number: [id number] Patient Account Number: [id number] Date of Birth/Sex: 1951-05-23 (69 y.o. M) Treating RN: Army Melia Primary Care Provider: Aura Dials Other Clinician: Referring Provider: Aura Dials Treating Provider/Extender: Melburn Hake, Isley Weisheit Weeks in  Treatment: 15 Active Problems ICD-10 Evaluated Encounter Code Description Active Date Today Diagnosis I89.0 Lymphedema, not elsewhere classified 09/27/2019 No Yes I87.2 Venous insufficiency (chronic) (peripheral) 09/27/2019 No Yes E11.628 Type 2 diabetes mellitus with other skin complications 123XX123 No Yes I10 Essential (primary) hypertension 09/27/2019 No Yes I50.42 Chronic combined systolic (congestive) and diastolic (congestive) heart 09/27/2019 No Yes failure L02.416 Cutaneous abscess of left lower limb 09/27/2019 No Yes Inactive Problems Resolved Problems Electronic Signature(s) Signed: 01/10/2020 9:12:55 AM By: Worthy Keeler PA-C Entered By: Worthy Keeler on 01/10/2020 09:12:54 Adachi, [name] ([id number]) -------------------------------------------------------------------------------- Progress Note Details Patient Name: [name]. Date of Service: 01/10/2020 8:30 AM Medical Record Number: [id number] Patient Account Number: [id number] Date of Birth/Sex: 06-20-1951 (69 y.o. M) Treating RN: Army Melia Primary Care Provider: Aura Dials Other Clinician: Referring Provider: Aura Dials Treating Provider/Extender: Melburn Hake, Jacinta Penalver Weeks in Treatment: 15 Subjective Chief Complaint Information obtained from Patient Bilateral LE lymphedema left leg cellulitis History of Present Illness (HPI) 09/27/2019 on evaluation today patient appears to be doing poorly in regard to his bilateral lower extremities he does have bilateral lower extremity lymphedema. With that being said he also has a history of diabetes type 2, hypertension, congestive heart failure, and apparently has chronic venous insufficiency which has led to the lymphedema. Currently he also shows signs  of an infection of the left lower extremity which does have me concerned at this point as well. He tells me that he is not really having any significant pain but has had a lot of trouble with his legs in  general. He does have a compression stocking he is wearing on the right lower extremity he was wrapped with an Unna boot on the left lower extremity. The patient tells me that overall he has been doing with this for several weeks and home health has been initiated. He has not been on any antibiotics yet and he tells me that the home health nurse was not sure that it was infected based on what I see today I am concerned that this likely is infected at this point. Fortunately however there does not appear to be any evidence of systemic infection which is good news. The Unna boot does seem to be helping control the edema although he may benefit from a stronger compression wrap I think that at this point without having the ABIs completed the Unna boot is where I would stand. He is supposed to be having an arterial study with ABI as ordered by his cardiologist but he states he may have missed that appointment and has not called to reschedule he needs to contact them to get this rescheduled as soon as possible I explained to him. He tells me that he is draining a lot as far as the left lower extremity is concerned and that is something that he feels like is not lasting very long as far as the wraps are concerned when they put him on they seem to get wet extremely quickly. 10/04/2019 on evaluation today patient appears to be doing better compared to last week's evaluation. Fortunately there is no signs of active infection at this time. He has been tolerating the dressing changes without complication. He did undergo arterial studies yesterday and has an appointment tomorrow with the vascular specialist. With that being said his ABI on the left was 1.25 on the right 1.19 with a TBI of 0.61 on the left and 0.69 on the right. There was stated to be no evidence of obvious significant arterial disease and the patient had pretty much triphasic blood flow throughout. Overall I am very pleased with how things seem to  be progressing. We will see if the vascular doctor says anything different tomorrow but I even at this point I feel like he would do well with a stronger compression wrap to try to get some the fluid out of his leg at this point. The good news is his drainage has slowed down considerably I do believe the antibiotics have been beneficial for him. 10/18/2019 on evaluation today patient appears to be doing poorly in regard to his left lower extremity. He still having a lot of drainage and weeping from his leg this appears to be much more erythematous and in fact I am concerned that the erythema may be spreading up his leg as well. He has not experienced any fevers, chills, nausea, vomiting, or diarrhea up to this point. I explained to him that I really feel like based on what I am seeing he may need to potentially go to the hospital for evaluation and treatment potentially even with IV antibiotics depending on what they see. With that being said he tells me that he is not really able to do that due to the fact that he is the primary caregiver for his wife who is legally blind.  He states he is not good to be able to have anything to help as far as caring for her if he were to go into the hospital. Nonetheless he is unsure what to do in this regard. I really think that he needs faster treatment. For that reason I am going to likely try to get in touch with the infectious disease office to see if I can get him in sooner than [place]. 10/25/2019 on evaluation today patient appears to be doing a little better in regard to his weeping and edema compared to the last time I saw him. The dorsal surface of his foot is a little bit drier compared to what was this is good news. He did see Dr. Drucilla Schmidt on 10/22/2019. Dr. Drucilla Schmidt did feel that potentially this could be secondary to some cellulitis and he recommended subsequently placing the patient on Zyvox which the patient feels like has helped as well. There does not  appear to be any signs of active infection at this time which is good news systemically. I am very pleased that Dr. Drucilla Schmidt was able to see him so quickly I do appreciate this greatly. With that being said he also did recommend an MRI for the patient to evaluate for any deeper signs of infection. That is actually scheduled for this coming Tuesday. He also has a follow-up appoint with Dr. Drucilla Schmidt on Tuesday. The last thing Dr. Drucilla Schmidt did mention was the possibility of more aggressive diuresis. The patient is on torsemide. With that being said this is managed by his cardiologist. I think we may want to get in touch with her clinic and see if possibly this is something that could be increased for a short amount of time depending on their recommendations to see if we can get some additional fluid off of him which in turn would likely help tremendously with his weeping and edema. He is in agreement with this plan if his cardiologist is in agreement. 11/01/2019 on evaluation today patient unfortunately notes that he did not get his MRI done which was ordered by Dr. Drucilla Schmidt. Apparently when he went in it was 6:30 in the morning and they told him that he was there to get an MRI of his toes. With that being said the MRI was ordered of his ankle and foot and so long story short the patient said that he was not getting MRI of his toes and left. Dr. Drucilla Schmidt was alerted and again it does not appear that at all that was what he ordered that he did order the ankle and foot but nonetheless that is beside the point at this time. The patient still has considerable edema noted at this time the nurse that came out last after just put a Kerlix and Coban wrap on the patient she did not even properly wrap him. Subsequently they have also discharged him being that they state he is not skilled at this point. Therefore he is going to have to be seen here in the office only. Fortunately there is no signs of systemic infection he  still has a lot of erythema around the ankle and lower extremity region again there are any specific open wounds just general weeping and evidence of erythema which Dr. Drucilla Schmidt is questioning whether or not this is even infection or if it is just more secondary to the patient's lymphedema. He has had venous reflux studies which were negative for any abnormal findings unfortunately that is also not good to be an answer  for [place] here. I have recommended the patient today contact his primary care provider to see if they can increase his fluid pills and I also think we need to increase his compression wrap to a 4-layer compression wrap to try to more effectively manage his edema. JEYCOB, CHARETTE ([id number]) 11/08/2019 on evaluation today patient appears unfortunately to still be doing poorly. I did speak with his primary care provider office last week they were supposed to call him to advise what should be done as far as his fluid pills were concerned. With that being said they did not contact him as far as the patient tells me. I am not really sure exactly what is going on as far as that is concerned. Nonetheless I really think that the patient needs to be admitted to the hospital for treatment likely as I explained to him he is going to require IV Lasix to get fluid off and to be honest also think he may need IV antibiotics. I have made all the outpatient referrals I can to try to get things under control he has been on oral antibiotics I really just have not seen any significant improvement he was even on linezolid. That was prescribed By infectious disease. Overall I am very concerned about the fact that this is not getting better. 11/22/2019 on evaluation today patient unfortunately is not doing very well with regard to his lower extremity edema. The left lower extremity is significantly swollen and still significantly erythematous. He has bright green drainage. He did just see Dr. Drucilla Schmidt on 11/19/2019 and  Dr. Drucilla Schmidt did not feel like that this was infected. Subsequently he states that he feels like the issue is more 1 of not being at maximal diuresis and recommended the patient see his cardiologist to ensure that this was achieved. Subsequently Dr. Drucilla Schmidt states this is a issue with venous stasis dermatitis which again I completely understand that that may definitely be the case but unfortunately we've not been able to get things under control even with maximum compression at a 4 layer compression wrap. The patient is still having significant issues. Either way I do think the patient is getting need to follow-up with his cardiologist to see what we can do Dr. Drucilla Schmidt did make mention that the patient had a flareup of infection that Keflex 500 mg 4 times a day or Augmentin twice a day would be good options for nonpurulent cellulitis. The patient was again supposed to have gone to the hospital on the weekend following Christmas after he got things settled with family as far as getting someone to take care of his wife. With that being said he tells me that when it came around to that time he just didn't think about it. When asked him about why the drainage from his leg and the issues he is having that and remind him he told me "well it really hasn't changed". 11/29/2019 on evaluation today patient presents for follow-up concerning his left lower extremity edema and weeping. He did see his cardiologist at my request after I discussed with him the situation as well. This is Binnie Kand who is a family Designer, jewellery that he saw. Subsequently the patient was recommended to start metolazone 5 mg daily for the next 5 days. They also recommended that really he needed essentially bed rest with elevation of his legs over the next week. With that being said the patient states that he is unable to even get in bed due to the  fact that he has back trouble and has to sleep in his recliner. He never really gets in  the bed at all. This is probably part of the issue coupled with his heart not functioning quite as well as what we would like to see and then subsequently he also has the issue of being overweight which contributes to this as well. Overall were still trying to figure out how to best manage this to get his tweaking under control and prevent anything from worsening. Again Dr. Drucilla Schmidt really did not feel like there was any evidence of infectious processes going on at this point. 12/06/2019 on evaluation today patient's leg actually appears to be doing better with regard to edema control I am actually pleased in this regard. He also seems to be improving as far as drainage is concerned from the leg this is much better than what it was in the past. Fortunately there is no signs of active infection which is good news and overall the patient states he is actually happier with where things stand at this point. 12/13/2019 on evaluation today patient unfortunately is very dry with cracked skin noted at this point. Fortunately there is no signs of active infection at this time. With that being said I think he is really needs some moisturizing lotion something really good to counter loosen up the dry cracked skin I think even if 40% urea cream would be ideal at this point. I discussed this with him today. In fact even look this up on his phone so that he can order it if he needs to although he may be able to find it locally I am just not sure. I told him to check with his pharmacy first. 12/20/2019 on evaluation today patient unfortunately continues to have issues with swelling of his left lower extremity. We did use Tubigrip last week to try to give him a chance to be able to take this off and take a shower and then reapply it. With that being said he just cut it off and then did not have anything to reapply he did that on Friday after we saw him on Thursday and had nothing on in the interim for almost an entire week  since. Obviously that was not the direction that was given to him and when I asked him about it he stated that he just could not bend over to get off and he does not have anyone to help him. With that being said I do not think this is can be possible to have him take this off to take a shower simply due to the fact that again were not going to be able to keep the edema under control and he can get this off and back on it hurts his back way too much. 12/27/2019 on evaluation today patient actually appears to be mostly dry in regard to his lower extremity wounds in general. Fortunately there is no signs of active infection at this time. No fever chills noted. I really do not see anything open at this point which is good news. He does have a lot of dry skin that is flaking off. With that being said I think that likely I recommend wrapping him 1 more week and we can utilize the objective coating layer from the Profore wraps in order to prevent anything from sticking to the wound bed. After that I am hopeful we will be able to get him into the compression socks that he has he supposed  to bring 1 of those with him next week he also is supposed to switch out and start using the new ones on his right leg as when he has is completely worn out. 01/10/2020 upon evaluation today patient appears to be doing poorly at this point in regard to his lower extremities on the left. He still continues to have significant weeping he actually took the wrap off 2 days ago because he stated "we were planning to take it off today anyway". He did not however put any compression stockings on but obviously was not part of the plan. Still he continues to have significant swelling I really do not know that this is going to make a significant improvement and less were able to keep him well compressed all the time and again also get his edema under good control. He was supposed to go back to see his cardiologist although I do not think  that is actually been an appointment that he has made at this point that I can find anyway. He also is still taking the double dose of his Lasix but again I am not sure that that is doing the job here. Objective Constitutional Bouknight, Kashis M. ([id number]) Well-nourished and well-hydrated in no acute distress. Vitals Time Taken: 8:38 AM, Height: 69 in, Weight: 295 lbs, BMI: 43.6, Temperature: 98.5 F, Pulse: 88 bpm, Respiratory Rate: 18 breaths/min, Blood Pressure: 132/58 mmHg. Respiratory normal breathing without difficulty. Psychiatric this patient is able to make decisions and demonstrates good insight into disease process. Alert and Oriented x 3. pleasant and cooperative. General Notes: Upon inspection patient's left lower extremity appears to show signs of significant erythema secondary to chronic venous stasis he also has weeping although there are no actual open wounds at this point. I am concerned about the fact that he does not seem to be getting any better and in fact seems to get worse he is also not extremely compliant with anything we recommended he cannot even lay flat in the bed he states his back hurts too badly. I really think that he needs to go to the ER for admission to the hospital for possible IV antibiotics and IV diuretics in order to try to get the fluid out and help this area to resolve. With that being said he still tells me that he has no one that can take care of his wife she apparently is blind and therefore he states that that is not good to be possible. Nonetheless I discussed with him before that things may get to the point where he has no choice and that he has to urgently go to the hospital and that could be an even worse scenario in that regard. Other Condition(s) Patient presents with Lymphedema located on the Bilateral Leg. 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 Post procedure Diagnosis Wound #: Same as Pre-Procedure Plan Wound Cleansing: Clean wound with Normal Saline. - in office Cleanse wound with mild soap and water - left leg Skin Barriers/Peri-Wound Care: Moisturizing lotion Secondary Dressing: XtraSorb Other - Profor contact layer over weeping areas Dressing Change Frequency: Change dressing every week Follow-up Appointments: Return Appointment in 1 week. Edema Control: ARNOLDO, SALZMAN ([id number]) 4-Layer Compression System - Left Lower Extremity. Elevate  legs to the level of the heart and pump ankles as often as possible 1. My suggestion at this time based on what I am seeing is going to be that we have to reinitiate the XtraSorb at this point. I am also going to recommend that we use the Profore contact layer over the weeping areas to prevent this from sticking. I also and get a recommend a 4-layer compression wrap be continued as I think that still needed at this point to try to keep the edema under control. 2. I explained to the patient that I really feel like he needs to follow-up with his cardiologist as soon as possible. He was supposed to have a follow-up appointment and I am not exactly sure why nothing was scheduled although again the patient seems to be somewhat unaware of this. Either way I think he needs to contact them to see about getting something scheduled as soon as possible. 3. I still further discussed with the patient that he may need admission to the hospital for IV diuretics potentially IV antibiotics to try to get this under control. He still states he is the only caregiver for his wife who is blind and he has nobody but his daughter around  in [place] and "she is not good to be any help". Either way I am getting concerned that if that we do not do something the patient is just getting continue to worsen. We will see patient back for reevaluation in 1 week here in the clinic. If anything worsens or changes patient will contact our office for additional recommendations. Electronic Signature(s) Signed: 01/10/2020 1:21:39 PM By: Worthy Keeler PA-C Entered By: Worthy Keeler on 01/10/2020 13:21:38 Treptow, [name] ([id number]) -------------------------------------------------------------------------------- SuperBill Details Patient Name: [name]. Date of Service: 01/10/2020 Medical Record Number: [id number] Patient Account Number: [id number] Date of Birth/Sex: [birth year] (69 y.o. M) Treating RN: Army Melia Primary Care Provider: Aura Dials Other Clinician: Referring Provider: Aura Dials Treating Provider/Extender: Melburn Hake, Lynnett Langlinais Weeks in Treatment: 15 Diagnosis Coding ICD-10 Codes Code Description I89.0 Lymphedema, not elsewhere classified I87.2 Venous insufficiency (chronic) (peripheral) E11.628 Type 2 diabetes mellitus with other skin complications [id number] Essential (primary) hypertension I50.42 Chronic combined systolic (congestive) and diastolic (congestive) heart failure L02.416 Cutaneous abscess of left lower limb Facility Procedures CPT4 Code: [id number] Description: (Facility Use Only) [id number] - [facility] LT LEG Modifier: Quantity: 1 Physician Procedures CPT4 Code: [id number] Description: [id number] - WC PHYS LEVEL 3 - EST PT Modifier: Quantity: 1 CPT4 Code: Description: ICD-10 Diagnosis Description I89.0 Lymphedema, not elsewhere classified I87.2 Venous insufficiency (chronic) (peripheral) E11.628 Type 2 diabetes mellitus with other skin complications [id number] Essential (primary) hypertension Modifier: Quantity: Electronic Signature(s) Signed: 01/10/2020 1:25:17 PM By: Worthy Keeler  PA-C Previous Signature: 01/10/2020 11:52:49 AM Version By: Army Melia Entered By: Worthy Keeler on 01/10/2020 13:25:16

## 2020-01-17 ENCOUNTER — Other Ambulatory Visit: Payer: Self-pay

## 2020-01-17 ENCOUNTER — Encounter: Payer: Medicare Other | Attending: Physician Assistant | Admitting: Physician Assistant

## 2020-01-17 DIAGNOSIS — I5042 Chronic combined systolic (congestive) and diastolic (congestive) heart failure: Secondary | ICD-10-CM | POA: Insufficient documentation

## 2020-01-17 DIAGNOSIS — I872 Venous insufficiency (chronic) (peripheral): Secondary | ICD-10-CM | POA: Insufficient documentation

## 2020-01-17 DIAGNOSIS — E11628 Type 2 diabetes mellitus with other skin complications: Secondary | ICD-10-CM | POA: Insufficient documentation

## 2020-01-17 DIAGNOSIS — I890 Lymphedema, not elsewhere classified: Secondary | ICD-10-CM | POA: Insufficient documentation

## 2020-01-17 DIAGNOSIS — I110 Hypertensive heart disease with heart failure: Secondary | ICD-10-CM | POA: Insufficient documentation

## 2020-01-17 NOTE — Progress Notes (Addendum)
[name], [name] Visit Report for 01/17/2020 Chief Complaint Document Details Patient Name: [name], [name]. Date of Service: 01/17/2020 8:30 AM Medical Record Number: [id number] Patient Account Number: [id number] Date of Birth/Sex: August 30, 1951 (69 y.o. M) Treating RN: Army Melia Primary Care Provider: Aura Dials Other Clinician: Referring Provider: Aura Dials Treating Provider/Extender: Melburn Hake, Danesha Kirchoff Weeks in Treatment: 16 Information Obtained from: Patient Chief Complaint Bilateral LE lymphedema left leg cellulitis Electronic Signature(s) Signed: 01/17/2020 9:06:35 AM By: Worthy Keeler PA-C Entered By: Worthy Keeler on 01/17/2020 09:06:34 [name], [name] -------------------------------------------------------------------------------- HPI Details Patient Name: [name]. Date of Service: 01/17/2020 8:30 AM Medical Record Number: [id number] Patient Account Number: [id number] Date of Birth/Sex: 13-Nov-1951 (69 y.o. M) Treating RN: Army Melia Primary Care Provider: Aura Dials Other Clinician: Referring Provider: Aura Dials Treating Provider/Extender: Melburn Hake, Brixton Schnapp Weeks in Treatment: 16 History of Present Illness HPI Description: 09/27/2019 on evaluation today patient appears to be doing poorly in regard to his bilateral lower extremities he does have bilateral lower extremity lymphedema. With that being said he also has a history of diabetes type 2, hypertension, congestive heart failure, and apparently has chronic venous insufficiency which has led to the lymphedema. Currently he also shows signs of an infection of the left lower extremity which does have me concerned at this point as well. He tells me that he is not really having any significant pain but has had a lot of trouble with his legs in general. He does have a compression stocking he is wearing on the right lower extremity he was wrapped with an Unna boot on the left  lower extremity. The patient tells me that overall he has been doing with this for several weeks and home health has been initiated. He has not been on any antibiotics yet and he tells me that the home health nurse was not sure that it was infected based on what I see today I am concerned that this likely is infected at this point. Fortunately however there does not appear to be any evidence of systemic infection which is good news. The Unna boot does seem to be helping control the edema although he may benefit from a stronger compression wrap I think that at this point without having the ABIs completed the Unna boot is where I would stand. He is supposed to be having an arterial study with ABI as ordered by his cardiologist but he states he may have missed that appointment and has not called to reschedule he needs to contact them to get this rescheduled as soon as possible I explained to him. He tells me that he is draining a lot as far as the left lower extremity is concerned and that is something that he feels like is not lasting very long as far as the wraps are concerned when they put him on they seem to get wet extremely quickly. 10/04/2019 on evaluation today patient appears to be doing better compared to last week's evaluation. Fortunately there is no signs of active infection at this time. He has been tolerating the dressing changes without complication. He did undergo arterial studies yesterday and has an appointment tomorrow with the vascular specialist. With that being said his ABI on the left was 1.25 on the right 1.19 with a TBI of 0.61 on the left and 0.69 on the right. There was stated to be no evidence of obvious significant arterial disease and the patient had pretty much triphasic blood flow throughout. Overall  I am very pleased with how things seem to be progressing. We will see if the vascular doctor says anything different tomorrow but I even at this point I feel like he would do  well with a stronger compression wrap to try to get some the fluid out of his leg at this point. The good news is his drainage has slowed down considerably I do believe the antibiotics have been beneficial for him. 10/18/2019 on evaluation today patient appears to be doing poorly in regard to his left lower extremity. He still having a lot of drainage and weeping from his leg this appears to be much more erythematous and in fact I am concerned that the erythema may be spreading up his leg as well. He has not experienced any fevers, chills, nausea, vomiting, or diarrhea up to this point. I explained to him that I really feel like based on what I am seeing he may need to potentially go to the hospital for evaluation and treatment potentially even with IV antibiotics depending on what they see. With that being said he tells me that he is not really able to do that due to the fact that he is the primary caregiver for his wife who is legally blind. He states he is not good to be able to have anything to help as far as caring for her if he were to go into the hospital. Nonetheless he is unsure what to do in this regard. I really think that he needs faster treatment. For that reason I am going to likely try to get in touch with the infectious disease office to see if I can get him in sooner than [place]. 10/25/2019 on evaluation today patient appears to be doing a little better in regard to his weeping and edema compared to the last time I saw him. The dorsal surface of his foot is a little bit drier compared to what was this is good news. He did see Dr. Drucilla Schmidt on 10/22/2019. Dr. Drucilla Schmidt did feel that potentially this could be secondary to some cellulitis and he recommended subsequently placing the patient on Zyvox which the patient feels like has helped as well. There does not appear to be any signs of active infection at this time which is good news systemically. I am very pleased that Dr. Drucilla Schmidt was able  to see him so quickly I do appreciate this greatly. With that being said he also did recommend an MRI for the patient to evaluate for any deeper signs of infection. That is actually scheduled for this coming Tuesday. He also has a follow-up appoint with Dr. Drucilla Schmidt on Tuesday. The last thing Dr. Drucilla Schmidt did mention was the possibility of more aggressive diuresis. The patient is on torsemide. With that being said this is managed by his cardiologist. I think we may want to get in touch with her clinic and see if possibly this is something that could be increased for a short amount of time depending on their recommendations to see if we can get some additional fluid off of him which in turn would likely help tremendously with his weeping and edema. He is in agreement with this plan if his cardiologist is in agreement. 11/01/2019 on evaluation today patient unfortunately notes that he did not get his MRI done which was ordered by Dr. Drucilla Schmidt. Apparently when he went in it was 6:30 in the morning and they told him that he was there to get an MRI of his toes. With that  being said the MRI was ordered of his ankle and foot and so long story short the patient said that he was not getting MRI of his toes and left. Dr. Drucilla Schmidt was alerted and again it does not appear that at all that was what he ordered that he did order the ankle and foot but nonetheless that is beside the point at this time. The patient still has considerable edema noted at this time the nurse that came out last after just put a Kerlix and Coban wrap on the patient she did not even properly wrap him. Subsequently they have also discharged him being that they state he is not skilled at this point. Therefore he is going to have to be seen here in the office only. Fortunately there is no signs of systemic infection he still has a lot of erythema around the ankle and lower extremity region again there are any specific open wounds just general weeping  and evidence of erythema which Dr. Drucilla Schmidt is questioning whether or not this is even infection or if it is just more secondary to the patient's lymphedema. He has had venous reflux studies which were negative for any abnormal findings unfortunately that is also not good to be an answer for [place] here. I have recommended the patient today contact his primary care provider to see if they can increase his fluid pills and I also think we need to increase his compression wrap to a 4-layer compression wrap to try to more effectively manage his edema. 11/08/2019 on evaluation today patient appears unfortunately to still be doing poorly. I did speak with his primary care provider office last week they were supposed to call him to advise what should be done as far as his fluid pills were concerned. With that being said they did not contact him as far as the patient tells me. I am not really sure exactly what is going on as far as that is concerned. Nonetheless I really think that the patient needs to be admitted to the hospital for treatment likely as I explained to him he is going to require IV Lasix to get fluid off and to be honest also think he may need ZORIAN, GUNDERMAN. ([id number]) IV antibiotics. I have made all the outpatient referrals I can to try to get things under control he has been on oral antibiotics I really just have not seen any significant improvement he was even on linezolid. That was prescribed By infectious disease. Overall I am very concerned about the fact that this is not getting better. 11/22/2019 on evaluation today patient unfortunately is not doing very well with regard to his lower extremity edema. The left lower extremity is significantly swollen and still significantly erythematous. He has bright green drainage. He did just see Dr. Drucilla Schmidt on 11/19/2019 and Dr. Drucilla Schmidt did not feel like that this was infected. Subsequently he states that he feels like the issue is more 1 of not being at  maximal diuresis and recommended the patient see his cardiologist to ensure that this was achieved. Subsequently Dr. Drucilla Schmidt states this is a issue with venous stasis dermatitis which again I completely understand that that may definitely be the case but unfortunately we've not been able to get things under control even with maximum compression at a 4 layer compression wrap. The patient is still having significant issues. Either way I do think the patient is getting need to follow-up with his cardiologist to see what we can do Dr. Drucilla Schmidt  did make mention that the patient had a flareup of infection that Keflex 500 mg 4 times a day or Augmentin twice a day would be good options for nonpurulent cellulitis. The patient was again supposed to have gone to the hospital on the weekend following Christmas after he got things settled with family as far as getting someone to take care of his wife. With that being said he tells me that when it came around to that time he just didn't think about it. When asked him about why the drainage from his leg and the issues he is having that and remind him he told me "well it really hasn't changed". 11/29/2019 on evaluation today patient presents for follow-up concerning his left lower extremity edema and weeping. He did see his cardiologist at my request after I discussed with him the situation as well. This is Binnie Kand who is a family Designer, jewellery that he saw. Subsequently the patient was recommended to start metolazone 5 mg daily for the next 5 days. They also recommended that really he needed essentially bed rest with elevation of his legs over the next week. With that being said the patient states that he is unable to even get in bed due to the fact that he has back trouble and has to sleep in his recliner. He never really gets in the bed at all. This is probably part of the issue coupled with his heart not functioning quite as well as what we would like to see  and then subsequently he also has the issue of being overweight which contributes to this as well. Overall were still trying to figure out how to best manage this to get his tweaking under control and prevent anything from worsening. Again Dr. Drucilla Schmidt really did not feel like there was any evidence of infectious processes going on at this point. 12/06/2019 on evaluation today patient's leg actually appears to be doing better with regard to edema control I am actually pleased in this regard. He also seems to be improving as far as drainage is concerned from the leg this is much better than what it was in the past. Fortunately there is no signs of active infection which is good news and overall the patient states he is actually happier with where things stand at this point. 12/13/2019 on evaluation today patient unfortunately is very dry with cracked skin noted at this point. Fortunately there is no signs of active infection at this time. With that being said I think he is really needs some moisturizing lotion something really good to counter loosen up the dry cracked skin I think even if 40% urea cream would be ideal at this point. I discussed this with him today. In fact even look this up on his phone so that he can order it if he needs to although he may be able to find it locally I am just not sure. I told him to check with his pharmacy first. 12/20/2019 on evaluation today patient unfortunately continues to have issues with swelling of his left lower extremity. We did use Tubigrip last week to try to give him a chance to be able to take this off and take a shower and then reapply it. With that being said he just cut it off and then did not have anything to reapply he did that on Friday after we saw him on Thursday and had nothing on in the interim for almost an entire week since. Obviously that was not  the direction that was given to him and when I asked him about it he stated that he just could not bend  over to get off and he does not have anyone to help him. With that being said I do not think this is can be possible to have him take this off to take a shower simply due to the fact that again were not going to be able to keep the edema under control and he can get this off and back on it hurts his back way too much. 12/27/2019 on evaluation today patient actually appears to be mostly dry in regard to his lower extremity wounds in general. Fortunately there is no signs of active infection at this time. No fever chills noted. I really do not see anything open at this point which is good news. He does have a lot of dry skin that is flaking off. With that being said I think that likely I recommend wrapping him 1 more week and we can utilize the objective coating layer from the Profore wraps in order to prevent anything from sticking to the wound bed. After that I am hopeful we will be able to get him into the compression socks that he has he supposed to bring 1 of those with him next week he also is supposed to switch out and start using the new ones on his right leg as when he has is completely worn out. 01/10/2020 upon evaluation today patient appears to be doing poorly at this point in regard to his lower extremities on the left. He still continues to have significant weeping he actually took the wrap off 2 days ago because he stated "we were planning to take it off today anyway". He did not however put any compression stockings on but obviously was not part of the plan. Still he continues to have significant swelling I really do not know that this is going to make a significant improvement and less were able to keep him well compressed all the time and again also get his edema under good control. He was supposed to go back to see his cardiologist although I do not think that is actually been an appointment that he has made at this point that I can find anyway. He also is still taking the double dose of  his Lasix but again I am not sure that that is doing the job here. 01/17/2020 upon evaluation today patient appears to be doing quite well with regard to his leg all things considered. This is better than last week. With that being said he still has significant edema and weeping unfortunately this is going to continue to be an issue. There is no signs of active infection. He tells me that he got a new prescription for torsemide. To be honest I was not quite sure that he had been prescribed that yet I thought he had been prescribed Lasix but either way I think there may be a little bit of confusion here. I discussed this with the patient today as well. I also discussed with the patient the fact that I do believe he is going to likely require additional follow-up with his cardiologist. I suggested he give them a call to make an appointment. He told me that he called them last week but he did not make an appointment. I am not exactly sure what he called in for or what he was looking for out of that. Nonetheless there does not appear to be  any signs of active infection at this time. No fevers, chills, nausea, vomiting, or diarrhea. Electronic Signature(s) Signed: 01/17/2020 12:58:14 PM By: Worthy Keeler PA-C Entered By: Worthy Keeler on 01/17/2020 12:58:14 [name], [name] -------------------------------------------------------------------------------- Physical Exam Details Patient Name: [name], [name]. Date of Service: 01/17/2020 8:30 AM Medical Record Number: [id number] Patient Account Number: [id number] Date of Birth/Sex: 1951-03-10 (69 y.o. M) Treating RN: Army Melia Primary Care Provider: Aura Dials Other Clinician: Referring Provider: Aura Dials Treating Provider/Extender: STONE III, Brandice Busser Weeks in Treatment: 16 Constitutional Obese and well-hydrated in no acute distress. Respiratory normal breathing without difficulty. Psychiatric this patient is able to make  decisions and demonstrates good insight into disease process. Alert and Oriented x 3. pleasant and cooperative. Notes Upon inspection today patient's wounds again are mainly just more lymphedema to the left lower extremity there is nothing truly open as a wound just breakdown of the skin in general. Again this is been the case all along. Fortunately I am glad he does not have anything that is deeper but at the same time I am still not happy with the fact that he is not really making good progress in maintaining. I do think he needs to follow-up with his cardiologist as soon as possible to see if there is anything they can do to help. Electronic Signature(s) Signed: 01/17/2020 12:58:56 PM By: Worthy Keeler PA-C Entered By: Worthy Keeler on 01/17/2020 12:58:56 [name], [name] -------------------------------------------------------------------------------- Physician Orders Details Patient Name: [name]. Date of Service: 01/17/2020 8:30 AM Medical Record Number: [id number] Patient Account Number: [id number] Date of Birth/Sex: 1951/06/02 (69 y.o. M) Treating RN: Army Melia Primary Care Provider: Aura Dials Other Clinician: Referring Provider: Aura Dials Treating Provider/Extender: Melburn Hake, Maclovio Henson Weeks in Treatment: 74 Verbal / Phone Orders: No Diagnosis Coding ICD-10 Coding Code Description I89.0 Lymphedema, not elsewhere classified I87.2 Venous insufficiency (chronic) (peripheral) E11.628 Type 2 diabetes mellitus with other skin complications [id number] Essential (primary) hypertension I50.42 Chronic combined systolic (congestive) and diastolic (congestive) heart failure L02.416 Cutaneous abscess of left lower limb Wound Cleansing o Clean wound with Normal Saline. - in office o Cleanse wound with mild soap and water - left leg Skin Barriers/Peri-Wound Care o Moisturizing lotion Secondary Dressing o XtraSorb o Other - Profor contact layer over  weeping areas Dressing Change Frequency o Change dressing every week - Nurse visit as needed Follow-up Appointments o Return Appointment in 1 week. Edema Control o 4-Layer Compression System - Left Lower Extremity. o Elevate legs to the level of the heart and pump ankles as often as possible Electronic Signature(s) Signed: 01/17/2020 3:26:37 PM By: Army Melia Signed: 01/17/2020 5:37:52 PM By: Worthy Keeler PA-C Entered By: Army Melia on 01/17/2020 09:17:59 [place], [name] -------------------------------------------------------------------------------- Problem List Details Patient Name: [name], [name]. Date of Service: 01/17/2020 8:30 AM Medical Record Number: [id number] Patient Account Number: [id number] Date of Birth/Sex: July 20, 1951 (69 y.o. M) Treating RN: Army Melia Primary Care Provider: Aura Dials Other Clinician: Referring Provider: Aura Dials Treating Provider/Extender: Melburn Hake, [place] Goeken Weeks in Treatment: 16 Active Problems ICD-10 Evaluated Encounter Code Description Active Date Today Diagnosis I89.0 Lymphedema, not elsewhere classified 09/27/2019 No Yes I87.2 Venous insufficiency (chronic) (peripheral) 09/27/2019 No Yes E11.628 Type 2 diabetes mellitus with other skin complications [id number] No Yes I10 Essential (primary) hypertension 09/27/2019 No Yes I50.42 Chronic combined systolic (congestive) and diastolic (congestive) heart 09/27/2019 No Yes failure L02.416 Cutaneous abscess of left lower limb 09/27/2019 No Yes Inactive Problems  Resolved Problems Electronic Signature(s) Signed: 01/17/2020 9:06:19 AM By: Worthy Keeler PA-C Entered By: Worthy Keeler on 01/17/2020 09:06:19 [name], [name] -------------------------------------------------------------------------------- Progress Note Details Patient Name: [name]. Date of Service: 01/17/2020 8:30 AM Medical Record Number: [id number] Patient Account Number:  [id number] Date of Birth/Sex: Feb 06, 1951 (69 y.o. M) Treating RN: Army Melia Primary Care Provider: Aura Dials Other Clinician: Referring Provider: Aura Dials Treating Provider/Extender: Melburn Hake, Kasper Mudrick Weeks in Treatment: 16 Subjective Chief Complaint Information obtained from Patient Bilateral LE lymphedema left leg cellulitis History of Present Illness (HPI) 09/27/2019 on evaluation today patient appears to be doing poorly in regard to his bilateral lower extremities he does have bilateral lower extremity lymphedema. With that being said he also has a history of diabetes type 2, hypertension, congestive heart failure, and apparently has chronic venous insufficiency which has led to the lymphedema. Currently he also shows signs of an infection of the left lower extremity which does have me concerned at this point as well. He tells me that he is not really having any significant pain but has had a lot of trouble with his legs in general. He does have a compression stocking he is wearing on the right lower extremity he was wrapped with an Unna boot on the left lower extremity. The patient tells me that overall he has been doing with this for several weeks and home health has been initiated. He has not been on any antibiotics yet and he tells me that the home health nurse was not sure that it was infected based on what I see today I am concerned that this likely is infected at this point. Fortunately however there does not appear to be any evidence of systemic infection which is good news. The Unna boot does seem to be helping control the edema although he may benefit from a stronger compression wrap I think that at this point without having the ABIs completed the Unna boot is where I would stand. He is supposed to be having an arterial study with ABI as ordered by his cardiologist but he states he may have missed that appointment and has not called to reschedule he needs to contact  them to get this rescheduled as soon as possible I explained to him. He tells me that he is draining a lot as far as the left lower extremity is concerned and that is something that he feels like is not lasting very long as far as the wraps are concerned when they put him on they seem to get wet extremely quickly. 10/04/2019 on evaluation today patient appears to be doing better compared to last week's evaluation. Fortunately there is no signs of active infection at this time. He has been tolerating the dressing changes without complication. He did undergo arterial studies yesterday and has an appointment tomorrow with the vascular specialist. With that being said his ABI on the left was 1.25 on the right 1.19 with a TBI of 0.61 on the left and 0.69 on the right. There was stated to be no evidence of obvious significant arterial disease and the patient had pretty much triphasic blood flow throughout. Overall I am very pleased with how things seem to be progressing. We will see if the vascular doctor says anything different tomorrow but I even at this point I feel like he would do well with a stronger compression wrap to try to get some the fluid out of his leg at this point. The good news is  his drainage has slowed down considerably I do believe the antibiotics have been beneficial for him. 10/18/2019 on evaluation today patient appears to be doing poorly in regard to his left lower extremity. He still having a lot of drainage and weeping from his leg this appears to be much more erythematous and in fact I am concerned that the erythema may be spreading up his leg as well. He has not experienced any fevers, chills, nausea, vomiting, or diarrhea up to this point. I explained to him that I really feel like based on what I am seeing he may need to potentially go to the hospital for evaluation and treatment potentially even with IV antibiotics depending on what they see. With that being said he tells me  that he is not really able to do that due to the fact that he is the primary caregiver for his wife who is legally blind. He states he is not good to be able to have anything to help as far as caring for her if he were to go into the hospital. Nonetheless he is unsure what to do in this regard. I really think that he needs faster treatment. For that reason I am going to likely try to get in touch with the infectious disease office to see if I can get him in sooner than [place]. 10/25/2019 on evaluation today patient appears to be doing a little better in regard to his weeping and edema compared to the last time I saw him. The dorsal surface of his foot is a little bit drier compared to what was this is good news. He did see Dr. Drucilla Schmidt on 10/22/2019. Dr. Drucilla Schmidt did feel that potentially this could be secondary to some cellulitis and he recommended subsequently placing the patient on Zyvox which the patient feels like has helped as well. There does not appear to be any signs of active infection at this time which is good news systemically. I am very pleased that Dr. Drucilla Schmidt was able to see him so quickly I do appreciate this greatly. With that being said he also did recommend an MRI for the patient to evaluate for any deeper signs of infection. That is actually scheduled for this coming Tuesday. He also has a follow-up appoint with Dr. Drucilla Schmidt on Tuesday. The last thing Dr. Drucilla Schmidt did mention was the possibility of more aggressive diuresis. The patient is on torsemide. With that being said this is managed by his cardiologist. I think we may want to get in touch with her clinic and see if possibly this is something that could be increased for a short amount of time depending on their recommendations to see if we can get some additional fluid off of him which in turn would likely help tremendously with his weeping and edema. He is in agreement with this plan if his cardiologist is in agreement. 11/01/2019  on evaluation today patient unfortunately notes that he did not get his MRI done which was ordered by Dr. Drucilla Schmidt. Apparently when he went in it was 6:30 in the morning and they told him that he was there to get an MRI of his toes. With that being said the MRI was ordered of his ankle and foot and so long story short the patient said that he was not getting MRI of his toes and left. Dr. Drucilla Schmidt was alerted and again it does not appear that at all that was what he ordered that he did order the ankle and foot but nonetheless  that is beside the point at this time. The patient still has considerable edema noted at this time the nurse that came out last after just put a Kerlix and Coban wrap on the patient she did not even properly wrap him. Subsequently they have also discharged him being that they state he is not skilled at this point. Therefore he is going to have to be seen here in the office only. Fortunately there is no signs of systemic infection he still has a lot of erythema around the ankle and lower extremity region again there are any specific open wounds just general weeping and evidence of erythema which Dr. Drucilla Schmidt is questioning whether or not this is even infection or if it is just more secondary to the patient's lymphedema. He has had venous reflux studies which were negative for any abnormal findings unfortunately that is also not good to be an answer for [place] here. I have recommended the patient today contact his primary care provider to see if they can increase his fluid pills and I also think we need to increase his compression wrap to a 4-layer compression wrap to try to more effectively manage his edema. [name], [name] 11/08/2019 on evaluation today patient appears unfortunately to still be doing poorly. I did speak with his primary care provider office last week they were supposed to call him to advise what should be done as far as his fluid pills were concerned. With that  being said they did not contact him as far as the patient tells me. I am not really sure exactly what is going on as far as that is concerned. Nonetheless I really think that the patient needs to be admitted to the hospital for treatment likely as I explained to him he is going to require IV Lasix to get fluid off and to be honest also think he may need IV antibiotics. I have made all the outpatient referrals I can to try to get things under control he has been on oral antibiotics I really just have not seen any significant improvement he was even on linezolid. That was prescribed By infectious disease. Overall I am very concerned about the fact that this is not getting better. 11/22/2019 on evaluation today patient unfortunately is not doing very well with regard to his lower extremity edema. The left lower extremity is significantly swollen and still significantly erythematous. He has bright green drainage. He did just see Dr. Drucilla Schmidt on 11/19/2019 and Dr. Drucilla Schmidt did not feel like that this was infected. Subsequently he states that he feels like the issue is more 1 of not being at maximal diuresis and recommended the patient see his cardiologist to ensure that this was achieved. Subsequently Dr. Drucilla Schmidt states this is a issue with venous stasis dermatitis which again I completely understand that that may definitely be the case but unfortunately we've not been able to get things under control even with maximum compression at a 4 layer compression wrap. The patient is still having significant issues. Either way I do think the patient is getting need to follow-up with his cardiologist to see what we can do Dr. Drucilla Schmidt did make mention that the patient had a flareup of infection that Keflex 500 mg 4 times a day or Augmentin twice a day would be good options for nonpurulent cellulitis. The patient was again supposed to have gone to the hospital on the weekend following Christmas after he got things settled with  family as far as getting  someone to take care of his wife. With that being said he tells me that when it came around to that time he just didn't think about it. When asked him about why the drainage from his leg and the issues he is having that and remind him he told me "well it really hasn't changed". 11/29/2019 on evaluation today patient presents for follow-up concerning his left lower extremity edema and weeping. He did see his cardiologist at my request after I discussed with him the situation as well. This is Binnie Kand who is a family Designer, jewellery that he saw. Subsequently the patient was recommended to start metolazone 5 mg daily for the next 5 days. They also recommended that really he needed essentially bed rest with elevation of his legs over the next week. With that being said the patient states that he is unable to even get in bed due to the fact that he has back trouble and has to sleep in his recliner. He never really gets in the bed at all. This is probably part of the issue coupled with his heart not functioning quite as well as what we would like to see and then subsequently he also has the issue of being overweight which contributes to this as well. Overall were still trying to figure out how to best manage this to get his tweaking under control and prevent anything from worsening. Again Dr. Drucilla Schmidt really did not feel like there was any evidence of infectious processes going on at this point. 12/06/2019 on evaluation today patient's leg actually appears to be doing better with regard to edema control I am actually pleased in this regard. He also seems to be improving as far as drainage is concerned from the leg this is much better than what it was in the past. Fortunately there is no signs of active infection which is good news and overall the patient states he is actually happier with where things stand at this point. 12/13/2019 on evaluation today patient unfortunately is very  dry with cracked skin noted at this point. Fortunately there is no signs of active infection at this time. With that being said I think he is really needs some moisturizing lotion something really good to counter loosen up the dry cracked skin I think even if 40% urea cream would be ideal at this point. I discussed this with him today. In fact even look this up on his phone so that he can order it if he needs to although he may be able to find it locally I am just not sure. I told him to check with his pharmacy first. 12/20/2019 on evaluation today patient unfortunately continues to have issues with swelling of his left lower extremity. We did use Tubigrip last week to try to give him a chance to be able to take this off and take a shower and then reapply it. With that being said he just cut it off and then did not have anything to reapply he did that on Friday after we saw him on Thursday and had nothing on in the interim for almost an entire week since. Obviously that was not the direction that was given to him and when I asked him about it he stated that he just could not bend over to get off and he does not have anyone to help him. With that being said I do not think this is can be possible to have him take this off to take a shower  simply due to the fact that again were not going to be able to keep the edema under control and he can get this off and back on it hurts his back way too much. 12/27/2019 on evaluation today patient actually appears to be mostly dry in regard to his lower extremity wounds in general. Fortunately there is no signs of active infection at this time. No fever chills noted. I really do not see anything open at this point which is good news. He does have a lot of dry skin that is flaking off. With that being said I think that likely I recommend wrapping him 1 more week and we can utilize the objective coating layer from the Profore wraps in order to prevent anything from sticking  to the wound bed. After that I am hopeful we will be able to get him into the compression socks that he has he supposed to bring 1 of those with him next week he also is supposed to switch out and start using the new ones on his right leg as when he has is completely worn out. 01/10/2020 upon evaluation today patient appears to be doing poorly at this point in regard to his lower extremities on the left. He still continues to have significant weeping he actually took the wrap off 2 days ago because he stated "we were planning to take it off today anyway". He did not however put any compression stockings on but obviously was not part of the plan. Still he continues to have significant swelling I really do not know that this is going to make a significant improvement and less were able to keep him well compressed all the time and again also get his edema under good control. He was supposed to go back to see his cardiologist although I do not think that is actually been an appointment that he has made at this point that I can find anyway. He also is still taking the double dose of his Lasix but again I am not sure that that is doing the job here. 01/17/2020 upon evaluation today patient appears to be doing quite well with regard to his leg all things considered. This is better than last week. With that being said he still has significant edema and weeping unfortunately this is going to continue to be an issue. There is no signs of active infection. He tells me that he got a new prescription for torsemide. To be honest I was not quite sure that he had been prescribed that yet I thought he had been prescribed Lasix but either way I think there may be a little bit of confusion here. I discussed this with the patient today as well. I also discussed with the patient the fact that I do believe he is going to likely require additional follow-up with his cardiologist. I suggested he give them a call to make  an appointment. He told me that he called them last week but he did not make an appointment. I am not exactly sure what he called in for or what he was looking for out of that. Nonetheless there does not appear to be any signs of active infection at this time. No fevers, chills, nausea, vomiting, or diarrhea. [name], [name] Objective Constitutional Obese and well-hydrated in no acute distress. Vitals Time Taken: 8:54 AM, Height: 69 in, Weight: 295 lbs, BMI: 43.6, Temperature: 98.4 F, Pulse: 92 bpm, Respiratory Rate: 18 breaths/min, Blood Pressure: 157/72 mmHg. Respiratory normal breathing  without difficulty. Psychiatric this patient is able to make decisions and demonstrates good insight into disease process. Alert and Oriented x 3. pleasant and cooperative. General Notes: Upon inspection today patient's wounds again are mainly just more lymphedema to the left lower extremity there is nothing truly open as a wound just breakdown of the skin in general. Again this is been the case all along. Fortunately I am glad he does not have anything that is deeper but at the same time I am still not happy with the fact that he is not really making good progress in maintaining. I do think he needs to follow-up with his cardiologist as soon as possible to see if there is anything they can do to help. Other Condition(s) Patient presents with Lymphedema located on the Bilateral Leg. 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 Post procedure Diagnosis Wound #: Same as Pre-Procedure Plan Wound Cleansing: Clean wound with Normal  Saline. - in office Cleanse wound with mild soap and water - left leg Skin Barriers/Peri-Wound Care: Moisturizing lotion Secondary Dressing: [name], LAM[place]ON ([id number]) Other - Profor contact layer over weeping areas Dressing Change Frequency: Change dressing every week - Nurse visit as needed Follow-up Appointments: Return Appointment in 1 week. Edema Control: 4-Layer Compression System - Left Lower Extremity. Elevate legs to the level of the heart and pump ankles as often as possible 1. My suggestion at this time is can be that we continue with the current wound care measures specifically with regard to the [facility] followed by the compression wrap I still think that is the best thing we can do for him at this point. We are using a 4-layer compression wrap. 2. He needs to try to elevate his legs though again he tells me that is hard because of his back. 3. I recommended that he follow-up with his cardiologist he apparently told me that he called him last week but did not make an appointment I am very unclear about what he called them to talk about otherwise. He keeps asking me where they can I do and again as I explained to him there were some things mentioned by his cardiologist in the note whenever I look that it but again I cannot tell him exactly what they would or would not do that as well I am wanting to go back and discuss things with him. We will see patient back for reevaluation in 1 week here in the clinic. If anything worsens or changes patient will contact our office for additional recommendations. Electronic Signature(s) Signed: 01/17/2020 12:59:54 PM By: Worthy Keeler PA-C Entered By: Worthy Keeler on 01/17/2020 12:59:53 Ruda, [name] -------------------------------------------------------------------------------- SuperBill Details Patient Name: [name]. Date of Service: 01/17/2020 Medical Record Number: [id number] Patient Account  Number: [id number] Date of Birth/Sex: [birth year] (69 y.o. 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

## 2020-01-24 ENCOUNTER — Encounter: Payer: Medicare Other | Admitting: Physician Assistant

## 2020-01-24 ENCOUNTER — Other Ambulatory Visit
Admission: EM | Admit: 2020-01-24 | Discharge: 2020-01-24 | Disposition: A | Discharge status: Home / Self Care | Payer: Medicare Other | Source: Ambulatory Visit | Attending: Physician Assistant | Admitting: Physician Assistant

## 2020-01-24 ENCOUNTER — Other Ambulatory Visit: Payer: Self-pay

## 2020-01-24 DIAGNOSIS — I890 Lymphedema, not elsewhere classified: Secondary | ICD-10-CM | POA: Diagnosis not present

## 2020-01-24 DIAGNOSIS — B999 Unspecified infectious disease: Secondary | ICD-10-CM | POA: Diagnosis present

## 2020-01-24 NOTE — Progress Notes (Addendum)
ANIEL, HUBBLE ([id number]) Visit Report for 01/24/2020 Chief Complaint Document Details Patient Name: [name], CAPELL. Date of Service: 01/24/2020 8:30 AM Medical Record Number: [id number] Patient Account Number: [id number] Date of Birth/Sex: 08/31/1951 (69 y.o. M) Treating RN: Army Melia Primary Care Provider: Aura Dials Other Clinician: Referring Provider: Aura Dials Treating Provider/Extender: Melburn Hake, Shamiyah Ngu Weeks in Treatment: 17 Information Obtained from: Patient Chief Complaint Bilateral LE lymphedema left leg cellulitis Electronic Signature(s) Signed: 01/24/2020 8:53:03 AM By: Worthy Keeler PA-C Entered By: Worthy Keeler on 01/24/2020 08:53:03 Kasson, [name] ([id number]) -------------------------------------------------------------------------------- HPI Details Patient Name: [name]. Date of Service: 01/24/2020 8:30 AM Medical Record Number: [id number] Patient Account Number: [id number] Date of Birth/Sex: 07-04-1951 (69 y.o. M) Treating RN: Army Melia Primary Care Provider: Aura Dials Other Clinician: Referring Provider: Aura Dials Treating Provider/Extender: Melburn Hake, [place] Weeks in Treatment: 17 History of Present Illness HPI Description: 09/27/2019 on evaluation today patient appears to be doing poorly in regard to his bilateral lower extremities he does have bilateral lower extremity lymphedema. With that being said he also has a history of diabetes type 2, hypertension, congestive heart failure, and apparently has chronic venous insufficiency which has led to the lymphedema. Currently he also shows signs of an infection of the left lower extremity which does have me concerned at this point as well. He tells me that he is not really having any significant pain but has had a lot of trouble with his legs in general. He does have a compression stocking he is wearing on the right lower extremity he was wrapped with an Unna boot on the left  lower extremity. The patient tells me that overall he has been doing with this for several weeks and home health has been initiated. He has not been on any antibiotics yet and he tells me that the home health nurse was not sure that it was infected based on what I see today I am concerned that this likely is infected at this point. Fortunately however there does not appear to be any evidence of systemic infection which is good news. The Unna boot does seem to be helping control the edema although he may benefit from a stronger compression wrap I think that at this point without having the ABIs completed the Unna boot is where I would stand. He is supposed to be having an arterial study with ABI as ordered by his cardiologist but he states he may have missed that appointment and has not called to reschedule he needs to contact them to get this rescheduled as soon as possible I explained to him. He tells me that he is draining a lot as far as the left lower extremity is concerned and that is something that he feels like is not lasting very long as far as the wraps are concerned when they put him on they seem to get wet extremely quickly. 10/04/2019 on evaluation today patient appears to be doing better compared to last week's evaluation. Fortunately there is no signs of active infection at this time. He has been tolerating the dressing changes without complication. He did undergo arterial studies yesterday and has an appointment tomorrow with the vascular specialist. With that being said his ABI on the left was 1.25 on the right 1.19 with a TBI of 0.61 on the left and 0.69 on the right. There was stated to be no evidence of obvious significant arterial disease and the patient had pretty much triphasic blood flow throughout. Overall  I am very pleased with how things seem to be progressing. We will see if the vascular doctor says anything different tomorrow but I even at this point I feel like he would do  well with a stronger compression wrap to try to get some the fluid out of his leg at this point. The good news is his drainage has slowed down considerably I do believe the antibiotics have been beneficial for him. 10/18/2019 on evaluation today patient appears to be doing poorly in regard to his left lower extremity. He still having a lot of drainage and weeping from his leg this appears to be much more erythematous and in fact I am concerned that the erythema may be spreading up his leg as well. He has not experienced any fevers, chills, nausea, vomiting, or diarrhea up to this point. I explained to him that I really feel like based on what I am seeing he may need to potentially go to the hospital for evaluation and treatment potentially even with IV antibiotics depending on what they see. With that being said he tells me that he is not really able to do that due to the fact that he is the primary caregiver for his wife who is legally blind. He states he is not good to be able to have anything to help as far as caring for her if he were to go into the hospital. Nonetheless he is unsure what to do in this regard. I really think that he needs faster treatment. For that reason I am going to likely try to get in touch with the infectious disease office to see if I can get him in sooner than [place]. 10/25/2019 on evaluation today patient appears to be doing a little better in regard to his weeping and edema compared to the last time I saw him. The dorsal surface of his foot is a little bit drier compared to what was this is good news. He did see Dr. Drucilla Schmidt on 10/22/2019. Dr. Drucilla Schmidt did feel that potentially this could be secondary to some cellulitis and he recommended subsequently placing the patient on Zyvox which the patient feels like has helped as well. There does not appear to be any signs of active infection at this time which is good news systemically. I am very pleased that Dr. Drucilla Schmidt was able  to see him so quickly I do appreciate this greatly. With that being said he also did recommend an MRI for the patient to evaluate for any deeper signs of infection. That is actually scheduled for this coming Tuesday. He also has a follow-up appoint with Dr. Drucilla Schmidt on Tuesday. The last thing Dr. Drucilla Schmidt did mention was the possibility of more aggressive diuresis. The patient is on torsemide. With that being said this is managed by his cardiologist. I think we may want to get in touch with her clinic and see if possibly this is something that could be increased for a short amount of time depending on their recommendations to see if we can get some additional fluid off of him which in turn would likely help tremendously with his weeping and edema. He is in agreement with this plan if his cardiologist is in agreement. 11/01/2019 on evaluation today patient unfortunately notes that he did not get his MRI done which was ordered by Dr. Drucilla Schmidt. Apparently when he went in it was 6:30 in the morning and they told him that he was there to get an MRI of his toes. With that  being said the MRI was ordered of his ankle and foot and so long story short the patient said that he was not getting MRI of his toes and left. Dr. Drucilla Schmidt was alerted and again it does not appear that at all that was what he ordered that he did order the ankle and foot but nonetheless that is beside the point at this time. The patient still has considerable edema noted at this time the nurse that came out last after just put a Kerlix and Coban wrap on the patient she did not even properly wrap him. Subsequently they have also discharged him being that they state he is not skilled at this point. Therefore he is going to have to be seen here in the office only. Fortunately there is no signs of systemic infection he still has a lot of erythema around the ankle and lower extremity region again there are any specific open wounds just general weeping  and evidence of erythema which Dr. Drucilla Schmidt is questioning whether or not this is even infection or if it is just more secondary to the patient's lymphedema. He has had venous reflux studies which were negative for any abnormal findings unfortunately that is also not good to be an answer for [place] here. I have recommended the patient today contact his primary care provider to see if they can increase his fluid pills and I also think we need to increase his compression wrap to a 4-layer compression wrap to try to more effectively manage his edema. 11/08/2019 on evaluation today patient appears unfortunately to still be doing poorly. I did speak with his primary care provider office last week they were supposed to call him to advise what should be done as far as his fluid pills were concerned. With that being said they did not contact him as far as the patient tells me. I am not really sure exactly what is going on as far as that is concerned. Nonetheless I really think that the patient needs to be admitted to the hospital for treatment likely as I explained to him he is going to require IV Lasix to get fluid off and to be honest also think he may need IVER, MIKLAS. ([id number]) IV antibiotics. I have made all the outpatient referrals I can to try to get things under control he has been on oral antibiotics I really just have not seen any significant improvement he was even on linezolid. That was prescribed By infectious disease. Overall I am very concerned about the fact that this is not getting better. 11/22/2019 on evaluation today patient unfortunately is not doing very well with regard to his lower extremity edema. The left lower extremity is significantly swollen and still significantly erythematous. He has bright green drainage. He did just see Dr. Drucilla Schmidt on 11/19/2019 and Dr. Drucilla Schmidt did not feel like that this was infected. Subsequently he states that he feels like the issue is more 1 of not being at  maximal diuresis and recommended the patient see his cardiologist to ensure that this was achieved. Subsequently Dr. Drucilla Schmidt states this is a issue with venous stasis dermatitis which again I completely understand that that may definitely be the case but unfortunately we've not been able to get things under control even with maximum compression at a 4 layer compression wrap. The patient is still having significant issues. Either way I do think the patient is getting need to follow-up with his cardiologist to see what we can do Dr. Drucilla Schmidt  did make mention that the patient had a flareup of infection that Keflex 500 mg 4 times a day or Augmentin twice a day would be good options for nonpurulent cellulitis. The patient was again supposed to have gone to the hospital on the weekend following Christmas after he got things settled with family as far as getting someone to take care of his wife. With that being said he tells me that when it came around to that time he just didn't think about it. When asked him about why the drainage from his leg and the issues he is having that and remind him he told me "well it really hasn't changed". 11/29/2019 on evaluation today patient presents for follow-up concerning his left lower extremity edema and weeping. He did see his cardiologist at my request after I discussed with him the situation as well. This is Binnie Kand who is a family Designer, jewellery that he saw. Subsequently the patient was recommended to start metolazone 5 mg daily for the next 5 days. They also recommended that really he needed essentially bed rest with elevation of his legs over the next week. With that being said the patient states that he is unable to even get in bed due to the fact that he has back trouble and has to sleep in his recliner. He never really gets in the bed at all. This is probably part of the issue coupled with his heart not functioning quite as well as what we would like to see  and then subsequently he also has the issue of being overweight which contributes to this as well. Overall were still trying to figure out how to best manage this to get his tweaking under control and prevent anything from worsening. Again Dr. Drucilla Schmidt really did not feel like there was any evidence of infectious processes going on at this point. 12/06/2019 on evaluation today patient's leg actually appears to be doing better with regard to edema control I am actually pleased in this regard. He also seems to be improving as far as drainage is concerned from the leg this is much better than what it was in the past. Fortunately there is no signs of active infection which is good news and overall the patient states he is actually happier with where things stand at this point. 12/13/2019 on evaluation today patient unfortunately is very dry with cracked skin noted at this point. Fortunately there is no signs of active infection at this time. With that being said I think he is really needs some moisturizing lotion something really good to counter loosen up the dry cracked skin I think even if 40% urea cream would be ideal at this point. I discussed this with him today. In fact even look this up on his phone so that he can order it if he needs to although he may be able to find it locally I am just not sure. I told him to check with his pharmacy first. 12/20/2019 on evaluation today patient unfortunately continues to have issues with swelling of his left lower extremity. We did use Tubigrip last week to try to give him a chance to be able to take this off and take a shower and then reapply it. With that being said he just cut it off and then did not have anything to reapply he did that on Friday after we saw him on Thursday and had nothing on in the interim for almost an entire week since. Obviously that was not  the direction that was given to him and when I asked him about it he stated that he just could not bend  over to get off and he does not have anyone to help him. With that being said I do not think this is can be possible to have him take this off to take a shower simply due to the fact that again were not going to be able to keep the edema under control and he can get this off and back on it hurts his back way too much. 12/27/2019 on evaluation today patient actually appears to be mostly dry in regard to his lower extremity wounds in general. Fortunately there is no signs of active infection at this time. No fever chills noted. I really do not see anything open at this point which is good news. He does have a lot of dry skin that is flaking off. With that being said I think that likely I recommend wrapping him 1 more week and we can utilize the objective coating layer from the Profore wraps in order to prevent anything from sticking to the wound bed. After that I am hopeful we will be able to get him into the compression socks that he has he supposed to bring 1 of those with him next week he also is supposed to switch out and start using the new ones on his right leg as when he has is completely worn out. 01/10/2020 upon evaluation today patient appears to be doing poorly at this point in regard to his lower extremities on the left. He still continues to have significant weeping he actually took the wrap off 2 days ago because he stated "we were planning to take it off today anyway". He did not however put any compression stockings on but obviously was not part of the plan. Still he continues to have significant swelling I really do not know that this is going to make a significant improvement and less were able to keep him well compressed all the time and again also get his edema under good control. He was supposed to go back to see his cardiologist although I do not think that is actually been an appointment that he has made at this point that I can find anyway. He also is still taking the double dose of  his Lasix but again I am not sure that that is doing the job here. 01/17/2020 upon evaluation today patient appears to be doing quite well with regard to his leg all things considered. This is better than last week. With that being said he still has significant edema and weeping unfortunately this is going to continue to be an issue. There is no signs of active infection. He tells me that he got a new prescription for torsemide. To be honest I was not quite sure that he had been prescribed that yet I thought he had been prescribed Lasix but either way I think there may be a little bit of confusion here. I discussed this with the patient today as well. I also discussed with the patient the fact that I do believe he is going to likely require additional follow-up with his cardiologist. I suggested he give them a call to make an appointment. He told me that he called them last week but he did not make an appointment. I am not exactly sure what he called in for or what he was looking for out of that. Nonetheless there does not appear to be  any signs of active infection at this time. No fevers, chills, nausea, vomiting, or diarrhea. 01/24/2020 upon evaluation today patient appears to be doing poorly in regard to his left lower extremity. He has bright green almost teal colored drainage which is very consistent with likely Pseudomonas. He also has a Pseudomonas-like smell to the drainage noted at this point. Fortunately there is no signs of active systemic infection at this time. No fevers, chills, nausea, vomiting, or diarrhea. With that being said I explained to the patient that this can change quickly that he can definitely become septic as a result of what we are seeing here if he does not treat this appropriately. With that being said I really think he needs to be in the ER as soon as possible in fact I really want him to go today he tells me there is no way he is likely to be able to get that set up in  time. He takes care of his wife who is legally blind and I completely understand the constraints there nonetheless he is going to take care of himself if he is good to be around to take care of her. SARA, SELVIDGE ([id number]) Electronic Signature(s) Signed: 01/24/2020 9:07:10 AM By: Worthy Keeler PA-C Entered By: Worthy Keeler on 01/24/2020 09:07:10 Axel, [name] ([id number]) -------------------------------------------------------------------------------- Physical Exam Details Patient Name: KIRTIS, CHALLIS. Date of Service: 01/24/2020 8:30 AM Medical Record Number: [id number] Patient Account Number: [id number] Date of Birth/Sex: 12-Jan-1951 (69 y.o. M) Treating RN: Army Melia Primary Care Provider: Aura Dials Other Clinician: Referring Provider: Aura Dials Treating Provider/Extender: STONE III, Anndrea Mihelich Weeks in Treatment: 17 Constitutional Obese and well-hydrated in no acute distress. Respiratory normal breathing without difficulty. Psychiatric this patient is able to make decisions and demonstrates good insight into disease process. Alert and Oriented x 3. pleasant and cooperative. Notes Upon inspection today patient's leg again shows no real wounds he just has and currently he does have bright green drainage along with a Pseudomonas smell that is almost healed colored that has me concerned about a Pseudomonas infection. Obviously this has come and gone we treated him several times I think right now it is much more prevalent and I think this is a risk for sepsis I do not believe he is septic yet but that does have me concerned nonetheless. Lymphedema with weeping Electronic Signature(s) Signed: 01/24/2020 9:07:45 AM By: Worthy Keeler PA-C Entered By: Worthy Keeler on 01/24/2020 09:07:44 Follansbee, [name] ([id number]) -------------------------------------------------------------------------------- Physician Orders Details Patient Name: [name]. Date of  Service: 01/24/2020 8:30 AM Medical Record Number: [id number] Patient Account Number: [id number] Date of Birth/Sex: 01-15-1951 (69 y.o. M) Treating RN: Army Melia Primary Care Provider: Aura Dials Other Clinician: Referring Provider: Aura Dials Treating Provider/Extender: Melburn Hake, Yvonnia Tango Weeks in Treatment: 42 Verbal / Phone Orders: No Diagnosis Coding ICD-10 Coding Code Description I89.0 Lymphedema, not elsewhere classified I87.2 Venous insufficiency (chronic) (peripheral) E11.628 Type 2 diabetes mellitus with other skin complications Y17 Essential (primary) hypertension I50.42 Chronic combined systolic (congestive) and diastolic (congestive) heart failure L02.416 Cutaneous abscess of left lower limb Wound Cleansing o Clean wound with Normal Saline. - in office o Cleanse wound with mild soap and water - left leg Skin Barriers/Peri-Wound Care o Moisturizing lotion Secondary Dressing o XtraSorb o Other - Profor contact layer over weeping areas Follow-up Appointments o Return Appointment in 1 week. Edema Control o Elevate legs to the level of the heart and pump ankles as often as  possible - Tubigrip bilateral Laboratory o Bacteria identified in Wound by Culture (MICRO) - left leg oooo LOINC Code: 7035-0 oooo Convenience Name: Wound culture routine Electronic Signature(s) Signed: 01/24/2020 9:13:28 AM By: Army Melia Signed: 01/25/2020 4:19:26 PM By: Worthy Keeler PA-C Previous Signature: 01/24/2020 9:04:22 AM Version By: Army Melia Entered By: Army Melia on 01/24/2020 09:04:42 Gaulin, [name] ([id number]) -------------------------------------------------------------------------------- Problem List Details Patient Name: TALLIE, HEVIA. Date of Service: 01/24/2020 8:30 AM Medical Record Number: [id number] Patient Account Number: [id number] Date of Birth/Sex: 1951-06-16 (69 y.o. M) Treating RN: Army Melia Primary Care Provider: Aura Dials  Other Clinician: Referring Provider: Aura Dials Treating Provider/Extender: Melburn Hake, Miamarie Moll Weeks in Treatment: 17 Active Problems ICD-10 Evaluated Encounter Code Description Active Date Today Diagnosis I89.0 Lymphedema, not elsewhere classified 09/27/2019 No Yes I87.2 Venous insufficiency (chronic) (peripheral) 09/27/2019 No Yes E11.628 Type 2 diabetes mellitus with other skin complications 38/08/1750 No Yes I10 Essential (primary) hypertension 09/27/2019 No Yes I50.42 Chronic combined systolic (congestive) and diastolic (congestive) heart 09/27/2019 No Yes failure L02.416 Cutaneous abscess of left lower limb 09/27/2019 No Yes Inactive Problems Resolved Problems Electronic Signature(s) Signed: 01/24/2020 8:52:56 AM By: Worthy Keeler PA-C Entered By: Worthy Keeler on 01/24/2020 08:52:55 Starling, [name] ([id number]) -------------------------------------------------------------------------------- Progress Note Details Patient Name: [name]. Date of Service: 01/24/2020 8:30 AM Medical Record Number: [id number] Patient Account Number: [id number] Date of Birth/Sex: 1951-07-11 (69 y.o. M) Treating RN: Army Melia Primary Care Provider: Aura Dials Other Clinician: Referring Provider: Aura Dials Treating Provider/Extender: Melburn Hake, Kinzy Weyers Weeks in Treatment: 17 Subjective Chief Complaint Information obtained from Patient Bilateral LE lymphedema left leg cellulitis History of Present Illness (HPI) 09/27/2019 on evaluation today patient appears to be doing poorly in regard to his bilateral lower extremities he does have bilateral lower extremity lymphedema. With that being said he also has a history of diabetes type 2, hypertension, congestive heart failure, and apparently has chronic venous insufficiency which has led to the lymphedema. Currently he also shows signs of an infection of the left lower extremity which does have me concerned at this point as well.  He tells me that he is not really having any significant pain but has had a lot of trouble with his legs in general. He does have a compression stocking he is wearing on the right lower extremity he was wrapped with an Unna boot on the left lower extremity. The patient tells me that overall he has been doing with this for several weeks and home health has been initiated. He has not been on any antibiotics yet and he tells me that the home health nurse was not sure that it was infected based on what I see today I am concerned that this likely is infected at this point. Fortunately however there does not appear to be any evidence of systemic infection which is good news. The Unna boot does seem to be helping control the edema although he may benefit from a stronger compression wrap I think that at this point without having the ABIs completed the Unna boot is where I would stand. He is supposed to be having an arterial study with ABI as ordered by his cardiologist but he states he may have missed that appointment and has not called to reschedule he needs to contact them to get this rescheduled as soon as possible I explained to him. He tells me that he is draining a lot as far as the left lower extremity is concerned and that  is something that he feels like is not lasting very long as far as the wraps are concerned when they put him on they seem to get wet extremely quickly. 10/04/2019 on evaluation today patient appears to be doing better compared to last week's evaluation. Fortunately there is no signs of active infection at this time. He has been tolerating the dressing changes without complication. He did undergo arterial studies yesterday and has an appointment tomorrow with the vascular specialist. With that being said his ABI on the left was 1.25 on the right 1.19 with a TBI of 0.61 on the left and 0.69 on the right. There was stated to be no evidence of obvious significant arterial disease and the  patient had pretty much triphasic blood flow throughout. Overall I am very pleased with how things seem to be progressing. We will see if the vascular doctor says anything different tomorrow but I even at this point I feel like he would do well with a stronger compression wrap to try to get some the fluid out of his leg at this point. The good news is his drainage has slowed down considerably I do believe the antibiotics have been beneficial for him. 10/18/2019 on evaluation today patient appears to be doing poorly in regard to his left lower extremity. He still having a lot of drainage and weeping from his leg this appears to be much more erythematous and in fact I am concerned that the erythema may be spreading up his leg as well. He has not experienced any fevers, chills, nausea, vomiting, or diarrhea up to this point. I explained to him that I really feel like based on what I am seeing he may need to potentially go to the hospital for evaluation and treatment potentially even with IV antibiotics depending on what they see. With that being said he tells me that he is not really able to do that due to the fact that he is the primary caregiver for his wife who is legally blind. He states he is not good to be able to have anything to help as far as caring for her if he were to go into the hospital. Nonetheless he is unsure what to do in this regard. I really think that he needs faster treatment. For that reason I am going to likely try to get in touch with the infectious disease office to see if I can get him in sooner than [place]. 10/25/2019 on evaluation today patient appears to be doing a little better in regard to his weeping and edema compared to the last time I saw him. The dorsal surface of his foot is a little bit drier compared to what was this is good news. He did see Dr. Drucilla Schmidt on 10/22/2019. Dr. Drucilla Schmidt did feel that potentially this could be secondary to some cellulitis and he recommended  subsequently placing the patient on Zyvox which the patient feels like has helped as well. There does not appear to be any signs of active infection at this time which is good news systemically. I am very pleased that Dr. Drucilla Schmidt was able to see him so quickly I do appreciate this greatly. With that being said he also did recommend an MRI for the patient to evaluate for any deeper signs of infection. That is actually scheduled for this coming Tuesday. He also has a follow-up appoint with Dr. Drucilla Schmidt on Tuesday. The last thing Dr. Drucilla Schmidt did mention was the possibility of more aggressive diuresis. The patient is  on torsemide. With that being said this is managed by his cardiologist. I think we may want to get in touch with her clinic and see if possibly this is something that could be increased for a short amount of time depending on their recommendations to see if we can get some additional fluid off of him which in turn would likely help tremendously with his weeping and edema. He is in agreement with this plan if his cardiologist is in agreement. 11/01/2019 on evaluation today patient unfortunately notes that he did not get his MRI done which was ordered by Dr. Drucilla Schmidt. Apparently when he went in it was 6:30 in the morning and they told him that he was there to get an MRI of his toes. With that being said the MRI was ordered of his ankle and foot and so long story short the patient said that he was not getting MRI of his toes and left. Dr. Drucilla Schmidt was alerted and again it does not appear that at all that was what he ordered that he did order the ankle and foot but nonetheless that is beside the point at this time. The patient still has considerable edema noted at this time the nurse that came out last after just put a Kerlix and Coban wrap on the patient she did not even properly wrap him. Subsequently they have also discharged him being that they state he is not skilled at this point. Therefore he is  going to have to be seen here in the office only. Fortunately there is no signs of systemic infection he still has a lot of erythema around the ankle and lower extremity region again there are any specific open wounds just general weeping and evidence of erythema which Dr. Drucilla Schmidt is questioning whether or not this is even infection or if it is just more secondary to the patient's lymphedema. He has had venous reflux studies which were negative for any abnormal findings unfortunately that is also not good to be an answer for [place] here. I have recommended the patient today contact his primary care provider to see if they can increase his fluid pills and I also think we need to increase his compression wrap to a 4-layer compression wrap to try to more effectively manage his edema. DALTYN, DEGROAT ([id number]) 11/08/2019 on evaluation today patient appears unfortunately to still be doing poorly. I did speak with his primary care provider office last week they were supposed to call him to advise what should be done as far as his fluid pills were concerned. With that being said they did not contact him as far as the patient tells me. I am not really sure exactly what is going on as far as that is concerned. Nonetheless I really think that the patient needs to be admitted to the hospital for treatment likely as I explained to him he is going to require IV Lasix to get fluid off and to be honest also think he may need IV antibiotics. I have made all the outpatient referrals I can to try to get things under control he has been on oral antibiotics I really just have not seen any significant improvement he was even on linezolid. That was prescribed By infectious disease. Overall I am very concerned about the fact that this is not getting better. 11/22/2019 on evaluation today patient unfortunately is not doing very well with regard to his lower extremity edema. The left lower extremity is significantly swollen and  still significantly  erythematous. He has bright green drainage. He did just see Dr. Drucilla Schmidt on 11/19/2019 and Dr. Drucilla Schmidt did not feel like that this was infected. Subsequently he states that he feels like the issue is more 1 of not being at maximal diuresis and recommended the patient see his cardiologist to ensure that this was achieved. Subsequently Dr. Drucilla Schmidt states this is a issue with venous stasis dermatitis which again I completely understand that that may definitely be the case but unfortunately we've not been able to get things under control even with maximum compression at a 4 layer compression wrap. The patient is still having significant issues. Either way I do think the patient is getting need to follow-up with his cardiologist to see what we can do Dr. Drucilla Schmidt did make mention that the patient had a flareup of infection that Keflex 500 mg 4 times a day or Augmentin twice a day would be good options for nonpurulent cellulitis. The patient was again supposed to have gone to the hospital on the weekend following Christmas after he got things settled with family as far as getting someone to take care of his wife. With that being said he tells me that when it came around to that time he just didn't think about it. When asked him about why the drainage from his leg and the issues he is having that and remind him he told me "well it really hasn't changed". 11/29/2019 on evaluation today patient presents for follow-up concerning his left lower extremity edema and weeping. He did see his cardiologist at my request after I discussed with him the situation as well. This is Binnie Kand who is a family Designer, jewellery that he saw. Subsequently the patient was recommended to start metolazone 5 mg daily for the next 5 days. They also recommended that really he needed essentially bed rest with elevation of his legs over the next week. With that being said the patient states that he is unable to even get in  bed due to the fact that he has back trouble and has to sleep in his recliner. He never really gets in the bed at all. This is probably part of the issue coupled with his heart not functioning quite as well as what we would like to see and then subsequently he also has the issue of being overweight which contributes to this as well. Overall were still trying to figure out how to best manage this to get his tweaking under control and prevent anything from worsening. Again Dr. Drucilla Schmidt really did not feel like there was any evidence of infectious processes going on at this point. 12/06/2019 on evaluation today patient's leg actually appears to be doing better with regard to edema control I am actually pleased in this regard. He also seems to be improving as far as drainage is concerned from the leg this is much better than what it was in the past. Fortunately there is no signs of active infection which is good news and overall the patient states he is actually happier with where things stand at this point. 12/13/2019 on evaluation today patient unfortunately is very dry with cracked skin noted at this point. Fortunately there is no signs of active infection at this time. With that being said I think he is really needs some moisturizing lotion something really good to counter loosen up the dry cracked skin I think even if 40% urea cream would be ideal at this point. I discussed this with him today. In  fact even look this up on his phone so that he can order it if he needs to although he may be able to find it locally I am just not sure. I told him to check with his pharmacy first. 12/20/2019 on evaluation today patient unfortunately continues to have issues with swelling of his left lower extremity. We did use Tubigrip last week to try to give him a chance to be able to take this off and take a shower and then reapply it. With that being said he just cut it off and then did not have anything to reapply he did  that on Friday after we saw him on Thursday and had nothing on in the interim for almost an entire week since. Obviously that was not the direction that was given to him and when I asked him about it he stated that he just could not bend over to get off and he does not have anyone to help him. With that being said I do not think this is can be possible to have him take this off to take a shower simply due to the fact that again were not going to be able to keep the edema under control and he can get this off and back on it hurts his back way too much. 12/27/2019 on evaluation today patient actually appears to be mostly dry in regard to his lower extremity wounds in general. Fortunately there is no signs of active infection at this time. No fever chills noted. I really do not see anything open at this point which is good news. He does have a lot of dry skin that is flaking off. With that being said I think that likely I recommend wrapping him 1 more week and we can utilize the objective coating layer from the Profore wraps in order to prevent anything from sticking to the wound bed. After that I am hopeful we will be able to get him into the compression socks that he has he supposed to bring 1 of those with him next week he also is supposed to switch out and start using the new ones on his right leg as when he has is completely worn out. 01/10/2020 upon evaluation today patient appears to be doing poorly at this point in regard to his lower extremities on the left. He still continues to have significant weeping he actually took the wrap off 2 days ago because he stated "we were planning to take it off today anyway". He did not however put any compression stockings on but obviously was not part of the plan. Still he continues to have significant swelling I really do not know that this is going to make a significant improvement and less were able to keep him well compressed all the time and again also get  his edema under good control. He was supposed to go back to see his cardiologist although I do not think that is actually been an appointment that he has made at this point that I can find anyway. He also is still taking the double dose of his Lasix but again I am not sure that that is doing the job here. 01/17/2020 upon evaluation today patient appears to be doing quite well with regard to his leg all things considered. This is better than last week. With that being said he still has significant edema and weeping unfortunately this is going to continue to be an issue. There is no signs of active infection. He  tells me that he got a new prescription for torsemide. To be honest I was not quite sure that he had been prescribed that yet I thought he had been prescribed Lasix but either way I think there may be a little bit of confusion here. I discussed this with the patient today as well. I also discussed with the patient the fact that I do believe he is going to likely require additional follow-up with his cardiologist. I suggested he give them a call to make an appointment. He told me that he called them last week but he did not make an appointment. I am not exactly sure what he called in for or what he was looking for out of that. Nonetheless there does not appear to be any signs of active infection at this time. No fevers, chills, nausea, vomiting, or diarrhea. 01/24/2020 upon evaluation today patient appears to be doing poorly in regard to his left lower extremity. He has bright green almost teal colored drainage which is very consistent with likely Pseudomonas. He also has a Pseudomonas-like smell to the drainage noted at this point. Fortunately there is no signs of active systemic infection at this time. No fevers, chills, nausea, vomiting, or diarrhea. With that being said I explained to the patient BRAXTIN, BAMBA. ([id number]) that this can change quickly that he can definitely become septic as a  result of what we are seeing here if he does not treat this appropriately. With that being said I really think he needs to be in the ER as soon as possible in fact I really want him to go today he tells me there is no way he is likely to be able to get that set up in time. He takes care of his wife who is legally blind and I completely understand the constraints there nonetheless he is going to take care of himself if he is good to be around to take care of her. Objective Constitutional Obese and well-hydrated in no acute distress. Vitals Time Taken: 8:38 AM, Height: 69 in, Weight: 295 lbs, BMI: 43.6, Temperature: 98.3 F, Pulse: 93 bpm, Respiratory Rate: 18 breaths/min, Blood Pressure: 140/58 mmHg. Respiratory normal breathing without difficulty. Psychiatric this patient is able to make decisions and demonstrates good insight into disease process. Alert and Oriented x 3. pleasant and cooperative. General Notes: Upon inspection today patient's leg again shows no real wounds he just has and currently he does have bright green drainage along with a Pseudomonas smell that is almost healed colored that has me concerned about a Pseudomonas infection. Obviously this has come and gone we treated him several times I think right now it is much more prevalent and I think this is a risk for sepsis I do not believe he is septic yet but that does have me concerned nonetheless. Lymphedema with weeping Other Condition(s) Patient presents with Lymphedema located on the Bilateral Leg. 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 - in office Cleanse wound with mild soap and water  - left leg Skin Barriers/Peri-Wound Care: Moisturizing lotion Secondary Dressing: XtraSorb Other - Profor contact layer over weeping areas Follow-up Appointments: Return Appointment in 1 week. Edema Control: JORDON, KRISTIANSEN. ([id number]) Elevate legs to the level of the heart and pump ankles as  often as possible - Tubigrip bilateral Laboratory ordered were: Wound culture routine - left leg 1. My suggestion at this time is good be that the patient needs to get to the hospital as soon as possible. I really would recommend today although he states that his good have to have some time to get things under control before he will be able to get there from the standpoint of getting family set up to help care for his wife. If that is the case I completely understand but that is can mean that he needs to get to the hospital at least by tomorrow in my opinion. 2. I am get a send in a prescription for Levaquin for just a 7-day supply obviously I really want him to be in the hospital but I know is not going today and I am trying to prevent anything from worsening until he can get there. He does need to definitely go tomorrow however and I would recommend that his focus today be set not everything so that he can go to the ER for further evaluation and treatment of this infection. 3. I do recommend he continue to elevate his legs he needs fluid off as well we discussed this in great detail in the past over weeks and weeks. Nonetheless that something they can address as well at the hospital obviously as far as diuretics and antibiotics are concerned. We will see patient back for reevaluation in 1 week here in the clinic. If anything worsens or changes patient will contact our office for additional recommendations. However the patient does need to go to the ER ASAP today in order to have further evaluation and treatment since he cannot go today I would recommend definitely tomorrow and spent today setting up  family members to help take care of his wife who is legally blind. If he does not do this I think he is one of the risk of becoming septic and having a much bigger event here. Addendum: I was going to send in the Levaquin for the patient but he has multiple interactions with his current medications that can prolong QT interval which has me concerned among other interactions. Again I think this even to a greater degree necessitates that he needs to go to the ER for further evaluation and treatment. Electronic Signature(s) Signed: 01/24/2020 9:14:13 AM By: Worthy Keeler PA-C Previous Signature: 01/24/2020 9:10:58 AM Version By: Worthy Keeler PA-C Entered By: Worthy Keeler on 01/24/2020 09:14:12 Robinson, [name] ([id number]) -------------------------------------------------------------------------------- SuperBill Details Patient Name: [name]. Date of Service: 01/24/2020 Medical Record Number: [id number] Patient Account Number: [id number] Date of Birth/Sex: [birth year] (69 y.o. 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 N82 Essential (primary) hypertension I50.42 Chronic combined systolic (congestive) and diastolic (congestive) heart failure L02.416 Cutaneous abscess of left lower limb Facility Procedures CPT4 Code: [id number] Description: 99213 - WOUND CARE VISIT-LEV 3 EST PT Modifier: Quantity: 1 Physician Procedures CPT4 Code: [id number] Description: 99214 - WC PHYS LEVEL 4 - EST PT Modifier: Quantity: 1 CPT4 Code: Description: ICD-10 Diagnosis Description I89.0 Lymphedema, not elsewhere classified I87.2 Venous insufficiency  (chronic) (peripheral) E11.628 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

## 2020-01-26 LAB — AEROBIC CULTURE W GRAM STAIN (SUPERFICIAL SPECIMEN): Gram Stain: NONE SEEN

## 2020-01-31 ENCOUNTER — Other Ambulatory Visit: Payer: Self-pay

## 2020-01-31 ENCOUNTER — Encounter (HOSPITAL_COMMUNITY): Payer: Self-pay

## 2020-01-31 ENCOUNTER — Emergency Department (HOSPITAL_COMMUNITY): Payer: Medicare Other

## 2020-01-31 ENCOUNTER — Inpatient Hospital Stay (HOSPITAL_COMMUNITY)
Admission: EM | Admit: 2020-01-31 | Discharge: 2020-02-06 | DRG: 291 | Disposition: A | Discharge status: Home / Self Care | Payer: Medicare Other | Attending: Student in an Organized Health Care Education/Training Program | Admitting: Student in an Organized Health Care Education/Training Program

## 2020-01-31 ENCOUNTER — Emergency Department (HOSPITAL_BASED_OUTPATIENT_CLINIC_OR_DEPARTMENT_OTHER): Payer: Medicare Other

## 2020-01-31 ENCOUNTER — Encounter: Payer: Medicare Other | Admitting: Physician Assistant

## 2020-01-31 DIAGNOSIS — A46 Erysipelas: Secondary | ICD-10-CM | POA: Diagnosis present

## 2020-01-31 DIAGNOSIS — E669 Obesity, unspecified: Secondary | ICD-10-CM | POA: Diagnosis present

## 2020-01-31 DIAGNOSIS — Z79899 Other long term (current) drug therapy: Secondary | ICD-10-CM

## 2020-01-31 DIAGNOSIS — I480 Paroxysmal atrial fibrillation: Secondary | ICD-10-CM | POA: Diagnosis present

## 2020-01-31 DIAGNOSIS — Z96641 Presence of right artificial hip joint: Secondary | ICD-10-CM | POA: Diagnosis present

## 2020-01-31 DIAGNOSIS — E1122 Type 2 diabetes mellitus with diabetic chronic kidney disease: Secondary | ICD-10-CM | POA: Diagnosis present

## 2020-01-31 DIAGNOSIS — N182 Chronic kidney disease, stage 2 (mild): Secondary | ICD-10-CM | POA: Diagnosis present

## 2020-01-31 DIAGNOSIS — I130 Hypertensive heart and chronic kidney disease with heart failure and stage 1 through stage 4 chronic kidney disease, or unspecified chronic kidney disease: Principal | ICD-10-CM | POA: Diagnosis present

## 2020-01-31 DIAGNOSIS — I447 Left bundle-branch block, unspecified: Secondary | ICD-10-CM | POA: Diagnosis present

## 2020-01-31 DIAGNOSIS — I890 Lymphedema, not elsewhere classified: Secondary | ICD-10-CM | POA: Diagnosis not present

## 2020-01-31 DIAGNOSIS — N400 Enlarged prostate without lower urinary tract symptoms: Secondary | ICD-10-CM | POA: Diagnosis present

## 2020-01-31 DIAGNOSIS — I472 Ventricular tachycardia: Secondary | ICD-10-CM | POA: Diagnosis present

## 2020-01-31 DIAGNOSIS — Z842 Family history of other diseases of the genitourinary system: Secondary | ICD-10-CM

## 2020-01-31 DIAGNOSIS — I4891 Unspecified atrial fibrillation: Secondary | ICD-10-CM

## 2020-01-31 DIAGNOSIS — Z6841 Body Mass Index (BMI) 40.0 and over, adult: Secondary | ICD-10-CM

## 2020-01-31 DIAGNOSIS — M549 Dorsalgia, unspecified: Secondary | ICD-10-CM | POA: Diagnosis present

## 2020-01-31 DIAGNOSIS — I5043 Acute on chronic combined systolic (congestive) and diastolic (congestive) heart failure: Secondary | ICD-10-CM | POA: Diagnosis present

## 2020-01-31 DIAGNOSIS — L03116 Cellulitis of left lower limb: Secondary | ICD-10-CM | POA: Diagnosis not present

## 2020-01-31 DIAGNOSIS — I509 Heart failure, unspecified: Secondary | ICD-10-CM

## 2020-01-31 DIAGNOSIS — Z885 Allergy status to narcotic agent status: Secondary | ICD-10-CM

## 2020-01-31 DIAGNOSIS — R609 Edema, unspecified: Secondary | ICD-10-CM | POA: Diagnosis not present

## 2020-01-31 DIAGNOSIS — Z7984 Long term (current) use of oral hypoglycemic drugs: Secondary | ICD-10-CM

## 2020-01-31 DIAGNOSIS — Z882 Allergy status to sulfonamides status: Secondary | ICD-10-CM

## 2020-01-31 DIAGNOSIS — G8929 Other chronic pain: Secondary | ICD-10-CM | POA: Diagnosis present

## 2020-01-31 DIAGNOSIS — D649 Anemia, unspecified: Secondary | ICD-10-CM | POA: Diagnosis present

## 2020-01-31 DIAGNOSIS — E11628 Type 2 diabetes mellitus with other skin complications: Secondary | ICD-10-CM | POA: Diagnosis present

## 2020-01-31 DIAGNOSIS — L089 Local infection of the skin and subcutaneous tissue, unspecified: Secondary | ICD-10-CM | POA: Diagnosis present

## 2020-01-31 DIAGNOSIS — R3915 Urgency of urination: Secondary | ICD-10-CM | POA: Diagnosis present

## 2020-01-31 DIAGNOSIS — E119 Type 2 diabetes mellitus without complications: Secondary | ICD-10-CM

## 2020-01-31 DIAGNOSIS — L538 Other specified erythematous conditions: Secondary | ICD-10-CM

## 2020-01-31 DIAGNOSIS — K219 Gastro-esophageal reflux disease without esophagitis: Secondary | ICD-10-CM | POA: Diagnosis present

## 2020-01-31 DIAGNOSIS — M1990 Unspecified osteoarthritis, unspecified site: Secondary | ICD-10-CM | POA: Diagnosis present

## 2020-01-31 DIAGNOSIS — Z20822 Contact with and (suspected) exposure to covid-19: Secondary | ICD-10-CM | POA: Diagnosis present

## 2020-01-31 DIAGNOSIS — I878 Other specified disorders of veins: Secondary | ICD-10-CM | POA: Diagnosis present

## 2020-01-31 DIAGNOSIS — E1151 Type 2 diabetes mellitus with diabetic peripheral angiopathy without gangrene: Secondary | ICD-10-CM | POA: Diagnosis present

## 2020-01-31 DIAGNOSIS — I5033 Acute on chronic diastolic (congestive) heart failure: Secondary | ICD-10-CM | POA: Diagnosis present

## 2020-01-31 LAB — RETICULOCYTES
Immature Retic Fract: 14.3 % (ref 2.3–15.9)
RBC.: 4.11 MIL/uL — ABNORMAL LOW (ref 4.22–5.81)
Retic Count, Absolute: 76.4 10*3/uL (ref 19.0–186.0)
Retic Ct Pct: 1.9 % (ref 0.4–3.1)

## 2020-01-31 LAB — CBC WITH DIFFERENTIAL/PLATELET
Abs Immature Granulocytes: 0.05 10*3/uL (ref 0.00–0.07)
Basophils Absolute: 0.1 10*3/uL (ref 0.0–0.1)
Basophils Relative: 1 %
Eosinophils Absolute: 0.3 10*3/uL (ref 0.0–0.5)
Eosinophils Relative: 3 %
HCT: 40.2 % (ref 39.0–52.0)
Hemoglobin: 12.5 g/dL — ABNORMAL LOW (ref 13.0–17.0)
Immature Granulocytes: 1 %
Lymphocytes Relative: 14 %
Lymphs Abs: 1.3 10*3/uL (ref 0.7–4.0)
MCH: 29.5 pg (ref 26.0–34.0)
MCHC: 31.1 g/dL (ref 30.0–36.0)
MCV: 94.8 fL (ref 80.0–100.0)
Monocytes Absolute: 0.8 10*3/uL (ref 0.1–1.0)
Monocytes Relative: 8 %
Neutro Abs: 7.4 10*3/uL (ref 1.7–7.7)
Neutrophils Relative %: 73 %
Platelets: 246 10*3/uL (ref 150–400)
RBC: 4.24 MIL/uL (ref 4.22–5.81)
RDW: 13.7 % (ref 11.5–15.5)
WBC: 9.9 10*3/uL (ref 4.0–10.5)
nRBC: 0 % (ref 0.0–0.2)

## 2020-01-31 LAB — IRON AND TIBC
Iron: 44 ug/dL — ABNORMAL LOW (ref 45–182)
Saturation Ratios: 11 % — ABNORMAL LOW (ref 17.9–39.5)
TIBC: 385 ug/dL (ref 250–450)
UIBC: 341 ug/dL

## 2020-01-31 LAB — COMPREHENSIVE METABOLIC PANEL
ALT: 18 U/L (ref 0–44)
AST: 25 U/L (ref 15–41)
Albumin: 3 g/dL — ABNORMAL LOW (ref 3.5–5.0)
Alkaline Phosphatase: 75 U/L (ref 38–126)
Anion gap: 13 (ref 5–15)
BUN: 11 mg/dL (ref 8–23)
CO2: 24 mmol/L (ref 22–32)
Calcium: 8.2 mg/dL — ABNORMAL LOW (ref 8.9–10.3)
Chloride: 102 mmol/L (ref 98–111)
Creatinine, Ser: 1.32 mg/dL — ABNORMAL HIGH (ref 0.61–1.24)
GFR calc Af Amer: >60 mL/min (ref >60)
GFR calc non Af Amer: 54 mL/min — ABNORMAL LOW (ref >60)
Glucose, Bld: 298 mg/dL — ABNORMAL HIGH (ref 70–99)
Potassium: 3.8 mmol/L (ref 3.5–5.1)
Sodium: 139 mmol/L (ref 135–145)
Total Bilirubin: 1 mg/dL (ref 0.3–1.2)
Total Protein: 7 g/dL (ref 6.5–8.1)

## 2020-01-31 LAB — HEMOGLOBIN A1C
Hgb A1c MFr Bld: 7.3 % — ABNORMAL HIGH (ref 4.8–5.6)
Mean Plasma Glucose: 162.81 mg/dL

## 2020-01-31 LAB — FERRITIN: Ferritin: 46 ng/mL (ref 24–336)

## 2020-01-31 LAB — GLUCOSE, CAPILLARY
Glucose-Capillary: 146 mg/dL — ABNORMAL HIGH (ref 70–99)
Glucose-Capillary: 244 mg/dL — ABNORMAL HIGH (ref 70–99)

## 2020-01-31 LAB — VITAMIN B12: Vitamin B-12: 205 pg/mL (ref 180–914)

## 2020-01-31 LAB — HIV ANTIBODY (ROUTINE TESTING W REFLEX): HIV Screen 4th Generation wRfx: NONREACTIVE

## 2020-01-31 LAB — PROTIME-INR
INR: 1.1 (ref 0.8–1.2)
Prothrombin Time: 14.1 seconds (ref 11.4–15.2)

## 2020-01-31 LAB — ECHOCARDIOGRAM COMPLETE
Height: 69 in
Weight: 4800 oz

## 2020-01-31 LAB — BRAIN NATRIURETIC PEPTIDE: B Natriuretic Peptide: 312.2 pg/mL — ABNORMAL HIGH (ref 0.0–100.0)

## 2020-01-31 LAB — LACTIC ACID, PLASMA
Lactic Acid, Venous: 4.2 mmol/L (ref 0.5–1.9)
Lactic Acid, Venous: 2.9 mmol/L (ref 0.5–1.9)
Lactic Acid, Venous: 2.4 mmol/L (ref 0.5–1.9)

## 2020-01-31 LAB — SARS CORONAVIRUS 2 (TAT 6-24 HRS): SARS Coronavirus 2: NEGATIVE

## 2020-01-31 LAB — TROPONIN I (HIGH SENSITIVITY)
Troponin I (High Sensitivity): 45 ng/L — ABNORMAL HIGH (ref <18)
Troponin I (High Sensitivity): 47 ng/L — ABNORMAL HIGH (ref <18)
Troponin I (High Sensitivity): 52 ng/L — ABNORMAL HIGH (ref <18)

## 2020-01-31 LAB — TSH: TSH: 2.591 u[IU]/mL (ref 0.350–4.500)

## 2020-01-31 LAB — FOLATE: Folate: 5.3 ng/mL — ABNORMAL LOW (ref >5.9)

## 2020-01-31 MED ORDER — CEFAZOLIN SODIUM-DEXTROSE 1-4 GM/50ML-% IV SOLN
1.0000 g | Freq: Three times a day (TID) | INTRAVENOUS | Status: DC
  Administered 2020-01-31 – 2020-02-01 (×2): 1 g via INTRAVENOUS
  Filled 2020-01-31 (×3): qty 50

## 2020-01-31 MED ORDER — TORSEMIDE 20 MG PO TABS
40.0000 mg | ORAL_TABLET | Freq: Every day | ORAL | Status: DC
  Administered 2020-02-01: 40 mg via ORAL
  Filled 2020-01-31: qty 2

## 2020-01-31 MED ORDER — HEPARIN (PORCINE) 25000 UT/250ML-% IV SOLN
1700.0000 [IU]/h | INTRAVENOUS | Status: DC
  Administered 2020-01-31: 1450 [IU]/h via INTRAVENOUS
  Administered 2020-02-01: 1700 [IU]/h via INTRAVENOUS
  Filled 2020-01-31 (×3): qty 250

## 2020-01-31 MED ORDER — SODIUM CHLORIDE 0.9 % IV SOLN
INTRAVENOUS | Status: DC | PRN
  Administered 2020-01-31 – 2020-02-05 (×2): 250 mL via INTRAVENOUS

## 2020-01-31 MED ORDER — GABAPENTIN 400 MG PO CAPS
800.0000 mg | ORAL_CAPSULE | Freq: Every day | ORAL | Status: DC
  Administered 2020-01-31 – 2020-02-01 (×2): 800 mg via ORAL
  Filled 2020-01-31 (×3): qty 2

## 2020-01-31 MED ORDER — DILTIAZEM HCL-DEXTROSE 125-5 MG/125ML-% IV SOLN (PREMIX)
5.0000 mg/h | INTRAVENOUS | Status: DC
  Filled 2020-01-31: qty 125

## 2020-01-31 MED ORDER — VANCOMYCIN HCL IN DEXTROSE 1-5 GM/200ML-% IV SOLN
1000.0000 mg | Freq: Once | INTRAVENOUS | Status: AC
  Administered 2020-01-31: 16:00:00 1000 mg via INTRAVENOUS
  Filled 2020-01-31: qty 200

## 2020-01-31 MED ORDER — SODIUM CHLORIDE 0.9 % IV BOLUS: 2000.0000 mL | Freq: Once | INTRAVENOUS | Status: DC

## 2020-01-31 MED ORDER — INSULIN ASPART 100 UNIT/ML ~~LOC~~ SOLN
0.0000 [IU] | Freq: Three times a day (TID) | SUBCUTANEOUS | Status: DC
  Administered 2020-01-31: 3 [IU] via SUBCUTANEOUS
  Administered 2020-02-01: 4 [IU] via SUBCUTANEOUS
  Administered 2020-02-01 (×2): 3 [IU] via SUBCUTANEOUS
  Administered 2020-02-02: 4 [IU] via SUBCUTANEOUS
  Administered 2020-02-02 – 2020-02-03 (×2): 3 [IU] via SUBCUTANEOUS
  Administered 2020-02-03: 4 [IU] via SUBCUTANEOUS
  Administered 2020-02-04: 3 [IU] via SUBCUTANEOUS
  Administered 2020-02-04 (×2): 4 [IU] via SUBCUTANEOUS
  Administered 2020-02-05: 3 [IU] via SUBCUTANEOUS
  Administered 2020-02-05: 4 [IU] via SUBCUTANEOUS
  Administered 2020-02-05 – 2020-02-06 (×2): 3 [IU] via SUBCUTANEOUS

## 2020-01-31 MED ORDER — PIPERACILLIN-TAZOBACTAM 3.375 G IVPB 30 MIN
3.3750 g | Freq: Once | INTRAVENOUS | Status: AC
  Administered 2020-01-31: 3.375 g via INTRAVENOUS
  Filled 2020-01-31: qty 50

## 2020-01-31 MED ORDER — HEPARIN BOLUS VIA INFUSION
5000.0000 [IU] | Freq: Once | INTRAVENOUS | Status: AC
  Administered 2020-01-31: 5000 [IU] via INTRAVENOUS
  Filled 2020-01-31: qty 5000

## 2020-01-31 MED ORDER — SODIUM CHLORIDE 0.9 % IV BOLUS
500.0000 mL | Freq: Once | INTRAVENOUS | Status: AC
  Administered 2020-01-31: 500 mL via INTRAVENOUS

## 2020-01-31 MED ORDER — DILTIAZEM HCL 25 MG/5ML IV SOLN
10.0000 mg | Freq: Once | INTRAVENOUS | Status: AC
  Administered 2020-01-31: 10 mg via INTRAVENOUS
  Filled 2020-01-31: qty 5

## 2020-01-31 MED ORDER — METOPROLOL TARTRATE 25 MG PO TABS
25.0000 mg | ORAL_TABLET | Freq: Two times a day (BID) | ORAL | Status: DC
  Administered 2020-01-31 – 2020-02-01 (×2): 25 mg via ORAL
  Filled 2020-01-31 (×2): qty 1

## 2020-01-31 MED ORDER — LACTATED RINGERS IV BOLUS
1000.0000 mL | Freq: Once | INTRAVENOUS | Status: AC
  Administered 2020-01-31: 1000 mL via INTRAVENOUS

## 2020-01-31 MED ORDER — FINASTERIDE 5 MG PO TABS
5.0000 mg | ORAL_TABLET | Freq: Every day | ORAL | Status: DC
  Administered 2020-02-01 – 2020-02-06 (×6): 5 mg via ORAL
  Filled 2020-01-31 (×6): qty 1

## 2020-01-31 MED ORDER — INSULIN GLARGINE 100 UNIT/ML ~~LOC~~ SOLN
10.0000 [IU] | Freq: Every day | SUBCUTANEOUS | Status: DC
  Administered 2020-01-31 – 2020-02-05 (×6): 10 [IU] via SUBCUTANEOUS
  Filled 2020-01-31 (×7): qty 0.1

## 2020-01-31 NOTE — ED Notes (Signed)
Pt transported to [facility] via stretcher at this time.

## 2020-01-31 NOTE — Progress Notes (Signed)
Pharmacy Antibiotic Note  [name]. is a 69 y.o. male admitted on 01/31/2020 with cellulitis.  Pharmacy has been consulted for cefazolin dosing. PT is afebrile and WBC is WNL. SCr is 1.32 and lactic acid is elevated but trending down.   Plan: Cefazolin 1gm IV Q8H F/u renal fxn, C&S, clinical status *Pharmacy will sign off as no further dose adjustments are anticipated.   Height: 5\' 9"  (175.3 cm) Weight: 300 lb (136.1 kg) IBW/kg (Calculated) : 70.7  Temp (24hrs), Avg:98.3 F (36.8 C), Min:98.3 F (36.8 C), Max:98.3 F (36.8 C)  Recent Labs  Lab 01/31/20 1158 01/31/20 1322  WBC 9.9  --   CREATININE 1.32*  --   LATICACIDVEN 4.2* 2.9*    Estimated Creatinine Clearance: 71.4 mL/min (A) (by C-G formula based on SCr of 1.32 mg/dL (H)).    Allergies  Allergen Reactions  . Codeine Swelling    Water retention  . Sulfur Other (See Comments)    Not sure of reaction a young child    Thank you for allowing pharmacy to be a part of this patient's care.  Deaisha Welborn, Rande Lawman 01/31/2020 3:41 PM

## 2020-01-31 NOTE — ED Notes (Signed)
Pt noted to be in AFib 140-150's on return from imaging. Dr. Darl Householder notified, EKG repeated, and orders to follow. Pt denies any pain or distress.

## 2020-01-31 NOTE — Progress Notes (Signed)
  Echocardiogram 2D Echocardiogram has been attempted. Physician consult in progress. Will reattempt at later time.  Randa Lynn Kalis Friese 01/31/2020, 3:32 PM

## 2020-01-31 NOTE — Progress Notes (Signed)
  Echocardiogram 2D Echocardiogram has been performed.  [name] 01/31/2020, 4:52 PM

## 2020-01-31 NOTE — Progress Notes (Addendum)
ANTICOAGULATION CONSULT NOTE - Initial Consult  Pharmacy Consult for heparin Indication: atrial fibrillation  Allergies  Allergen Reactions  . Codeine Swelling    Water retention  . Sulfur Other (See Comments)    Not sure of reaction a young child    Patient Measurements: Height: 5\' 9"  (175.3 cm) Weight: 300 lb (136.1 kg) IBW/kg (Calculated) : 70.7 Heparin Dosing Weight: 102.7 kg  Vital Signs: Temp: 98.3 F (36.8 C) (03/18 1149) Temp Source: Oral (03/18 1149) BP: 127/64 (03/18 1530) Pulse Rate: 97 (03/18 1530)  Labs: Recent Labs    01/31/20 1158  HGB 12.5*  HCT 40.2  PLT 246  LABPROT 14.1  INR 1.1  CREATININE 1.32*    Estimated Creatinine Clearance: 71.4 mL/min (A) (by C-G formula based on SCr of 1.32 mg/dL (H)).   Medical History: Past Medical History:  Diagnosis Date  . Arthritis   . Back pain    occasionally  . BPH (benign prostatic hypertrophy)    takes Uroxatral daily as well as Finasteride   . Diabetes mellitus without complication ([place])    takes Metformin daily  . Diabetic foot infection ([place]) 10/22/2019  . GERD (gastroesophageal reflux disease)    no meds  . Hepatitis hx of   at age 5  . Hypertension    takes Losartan and HCTZ daily  . Joint pain   . Lymphedema 10/22/2019  . Urinary frequency   . Urinary urgency   . Venous stasis dermatitis 11/19/2019    Medications:  Scheduled:  . [START ON 02/01/2020] finasteride  5 mg Oral Daily  . gabapentin  800 mg Oral QHS  . heparin  5,000 Units Intravenous Once  . insulin aspart  0-20 Units Subcutaneous TID WC  . insulin glargine  10 Units Subcutaneous QHS  . metoprolol tartrate  25 mg Oral BID  . [START ON 02/01/2020] torsemide  40 mg Oral Daily   Infusions:  .  ceFAZolin (ANCEF) IV    . heparin    . vancomycin 1,000 mg (01/31/20 1543)    Assessment: [name] is a 69 y/o M presenting with new onset Afib. CHADSVASC score at least 3 due to age, diabetes, and CHF. He was not on any  anticoagulation PTA.  Hgb 12.5 (stable), plts WNL  Goal of Therapy:  Heparin level 0.3-0.7 units/ml Monitor platelets by anticoagulation protocol: Yes   Plan:  Give 5000 units bolus x 1 Start heparin infusion at 1450 units/hr Check anti-Xa level in 8 hours and daily while on heparin Continue to monitor H&H and platelets  Kennon Holter, PharmD PGY1 Ambulatory Care Pharmacy Resident 01/31/2020,4:07 PM

## 2020-01-31 NOTE — ED Provider Notes (Addendum)
[facility] EMERGENCY DEPARTMENT Provider Note   CSN: [id number] Arrival date & time: 01/31/20  1141     History Chief Complaint  Patient presents with  . Wound Infection    [name]. is a 69 y.o. male.  [name] is a 69 year old gentleman who is presenting to the ED today for a worsened left lower extremity wound.  He has a previous medical history significant for lymphedema, diastolic heart failure, diabetes.  [name] has had a left lower extremity chronic wound for roughly the past 4 months.  There is no initial injury.  He has been following with chronic wound care weekly on Thursdays.  He reports that he is primarily been getting leg wraps and compression hose with wound care and no further treatment.  He was seen by wound care this morning at which time he was told that he should be seen in the emergency room to assess his lower extremity for infection and that he likely needed antibiotics.  He is not sure why he needs to be assessed for infection at this time.  He reports that he has not had any significant changes in his lower extremity wound in recent days.        Past Medical History:  Diagnosis Date  . Arthritis   . Back pain    occasionally  . BPH (benign prostatic hypertrophy)    takes Uroxatral daily as well as Finasteride   . Diabetes mellitus without complication ([place])    takes Metformin daily  . Diabetic foot infection ([place]) 10/22/2019  . GERD (gastroesophageal reflux disease)    no meds  . Hepatitis hx of   at age 72  . Hypertension    takes Losartan and HCTZ daily  . Joint pain   . Lymphedema 10/22/2019  . Urinary frequency   . Urinary urgency   . Venous stasis dermatitis 11/19/2019    Patient Active Problem List   Diagnosis Date Noted  . Venous stasis dermatitis 11/19/2019  . Diabetic foot infection ([place]) 10/22/2019  . Lymphedema 10/22/2019  . Chronic diastolic heart failure ([place]) 06/20/2018  . Benign prostatic  hyperplasia with urinary obstruction 01/30/2016  . Primary localized osteoarthrosis of pelvic region 01/29/2015  . Benign fibroma of prostate 06/17/2014  . Bladder neck obstruction 12/17/2013  . Elevated prostate specific antigen (PSA) 12/17/2013  . ED (erectile dysfunction) of organic origin 12/17/2013  . Decreased libido 12/17/2013  . Type 2 diabetes mellitus ([place]) 03/19/2013  . Essential hypertension 03/19/2013    Past Surgical History:  Procedure Laterality Date  . APPENDECTOMY     age 65  . COLONOSCOPY    . KNEE ARTHROSCOPY  1984   right and left   . SHOULDER ARTHROSCOPY  2003   right RCR-GSC-stayed overnight-had SOB  . SHOULDER ARTHROSCOPY WITH ROTATOR CUFF REPAIR AND SUBACROMIAL DECOMPRESSION Left 03/13/2013   Procedure: LEFT SHOULDER ARTHROSCOPY WITH SUBACROMIAL DECOMPRESSION, DISTAL CLAVICLE RESECTION AND REPAIR ROTATOR CUFF AND LABRAL DEBRIDEMENT;  Surgeon: [name]., MD;  Location: [facility];  Service: Orthopedics;  Laterality: Left;  . TOTAL HIP ARTHROPLASTY Right 01/29/2015   Procedure: TOTAL HIP ARTHROPLASTY ANTERIOR APPROACH;  Surgeon: [name], MD;  Location: [facility];  Service: Orthopedics;  Laterality: Right;  Marland Kitchen VASECTOMY    . VASECTOMY REVERSAL  1993       Family History  Problem Relation Age of Onset  . Benign prostatic hyperplasia Father   . Kidney cancer Neg Hx   .  Kidney disease Neg Hx     Social History   Tobacco Use  . Smoking status: Never Smoker  . Smokeless tobacco: Former Network engineer Use Topics  . Alcohol use: Yes    Alcohol/week: 0.0 standard drinks    Comment: occasionally beer or scotch  . Drug use: No    Home Medications Prior to Admission medications   Medication Sig Start Date End Date Taking? Authorizing Provider  Cholecalciferol (VITAMIN D3) 5000 UNITS TABS Take 1 tablet by mouth daily. Reported on 04/29/2016   Yes [provider]  finasteride (PROSCAR) 5 MG tablet Take 5 mg by mouth daily.    Yes [provider]  fluticasone (FLONASE) 50 MCG/ACT nasal spray Place 2 sprays into both nostrils daily as needed for allergies or rhinitis.   Yes [provider]  GABAPENTIN PO Take 800 mg by mouth daily as needed (pain).    Yes [provider]  glipiZIDE (GLUCOTROL XL) 5 MG 24 hr tablet Take 10 mg by mouth daily with breakfast.  01/30/19  Yes [provider]  potassium chloride SA (KLOR-CON) 20 MEQ tablet Take 1 tablet by mouth daily 09/18/19  Yes Weaver, Scott T, PA-C  torsemide (DEMADEX) 20 MG tablet Take 2 tablets (40 mg total) by mouth daily. 09/12/19 01/31/20 Yes Miquel Dunn, NP  metolazone (ZAROXOLYN) 5 MG tablet Take 1 tablet (5 mg total) by mouth daily for 5 days. 11/27/19 12/02/19  Miquel Dunn, NP  metoprolol succinate (TOPROL-XL) 50 MG 24 hr tablet TAKE 1 TABLET BY MOUTH  DAILY WITH OR IMMEDIATLEY  FOLLOWING A MEAL Patient not taking: Reported on 11/27/2019 10/02/18   Deboraha Sprang, MD    Allergies    Codeine and Sulfur  Review of Systems   Review of Systems  Constitutional: Negative for fatigue and fever.  HENT: Negative for congestion and sore throat.   Eyes: Negative for visual disturbance.  Respiratory: Negative for cough, shortness of breath and wheezing.   Cardiovascular: Positive for leg swelling. Negative for chest pain and palpitations.  Gastrointestinal: Positive for diarrhea (chronically loose stools). Negative for abdominal distention, abdominal pain, anal bleeding, nausea and vomiting.  Genitourinary: Negative for dysuria.  Musculoskeletal: Negative for arthralgias.  Skin: Positive for wound. Negative for rash.  Neurological: Negative for weakness and headaches.    Physical Exam Updated Vital Signs BP 140/68   Pulse 94   Temp 98.3 F (36.8 C) (Oral)   Resp 16   Ht 5\' 9"  (1.753 m)   Wt 136.1 kg   SpO2 96%   BMI 44.30 kg/m   Physical Exam Constitutional:      Appearance: He is obese.      Comments: Chronically ill-appearing patient  HENT:     Head: Normocephalic and atraumatic.     Nose: Nose normal.     Mouth/Throat:     Mouth: Mucous membranes are moist.     Pharynx: Oropharynx is clear.  Eyes:     Conjunctiva/sclera: Conjunctivae normal.     Pupils: Pupils are equal, round, and reactive to light.  Cardiovascular:     Rate and Rhythm: Tachycardia present. Rhythm irregular.     Pulses: Normal pulses.     Heart sounds: No murmur.  Pulmonary:     Effort: Pulmonary effort is normal.     Breath sounds: Normal breath sounds. No wheezing or rales.  Abdominal:     General: Bowel sounds are normal. There is no distension.     Palpations:  Abdomen is soft.     Tenderness: There is no abdominal tenderness. There is no guarding.  Musculoskeletal:        General: Swelling and tenderness present.     Cervical back: Normal range of motion and neck supple.     Right lower leg: Edema present.     Left lower leg: Edema present.     Comments: See photo of left LE wound. Weeping edema, superficial erosions. No focal areas. No purulence. No focal tenderness.  Skin:    General: Skin is warm.     Findings: Lesion present.  Neurological:     General: No focal deficit present.     Mental Status: He is alert. Mental status is at baseline.     Cranial Nerves: No cranial nerve deficit.  Psychiatric:        Mood and Affect: Mood normal.        Behavior: Behavior normal.     ED Results / Procedures / Treatments   Labs (all labs ordered are listed, but only abnormal results are displayed) Labs Reviewed  COMPREHENSIVE METABOLIC PANEL - Abnormal; Notable for the following components:      Result Value   Glucose, Bld 298 (*)    Creatinine, Ser 1.32 (*)    Calcium 8.2 (*)    Albumin 3.0 (*)    GFR calc non Af Amer 54 (*)    All other components within normal limits  LACTIC ACID, PLASMA - Abnormal; Notable for the following components:   Lactic Acid, Venous 4.2 (*)    All other  components within normal limits  LACTIC ACID, PLASMA - Abnormal; Notable for the following components:   Lactic Acid, Venous 2.9 (*)    All other components within normal limits  CBC WITH DIFFERENTIAL/PLATELET - Abnormal; Notable for the following components:   Hemoglobin 12.5 (*)    All other components within normal limits  CULTURE, BLOOD (ROUTINE X 2)  CULTURE, BLOOD (ROUTINE X 2)  SARS CORONAVIRUS 2 (TAT 6-24 HRS)  PROTIME-INR  LACTIC ACID, PLASMA  TSH  TROPONIN I (HIGH SENSITIVITY)    EKG EKG Interpretation  Date/Time:  Thursday January 31 2020 12:32:31 EDT Ventricular Rate:  110 PR Interval:    QRS Duration: 141 QT Interval:  425 QTC Calculation: 546 R Axis:   86 Text Interpretation: Sinus arrhythmia Multiple premature complexes, vent & supraven Consider right atrial enlargement Nonspecific intraventricular conduction delay Probable anteroseptal infarct, recent No significant change since last tracing Confirmed by Wandra Arthurs [phone]) on 01/31/2020 12:42:52 PM   Radiology DG Tibia/Fibula Left  Result Date: 01/31/2020 CLINICAL DATA:  Left leg cellulitis. EXAM: LEFT TIBIA AND FIBULA - 2 VIEW COMPARISON:  10/22/2019 FINDINGS: Marked diffuse soft tissue swelling is identified compatible with the clinical history of cellulitis. No underlying fracture, dislocation or focal bone erosion. No radio-opaque foreign bodies or soft tissue calcification. Mild sharpening of the tibial spines. IMPRESSION: 1. No acute bone abnormality. 2. Marked diffuse soft tissue swelling. Compatible with the clinical history of cellulitis Electronically Signed   By: Kerby Moors M.D.   On: 01/31/2020 14:19   VAS [place] LOWER EXTREMITY VENOUS (DVT) (ONLY [facility] & [facility])  Result Date: 01/31/2020  Lower Venous DVTStudy Indications: Edema, and Erythema.  Limitations: Body habitus, poor ultrasound/tissue interface and open wound. Comparison Study: No prior study Performing Technologist: Maudry Mayhew MHA, RDMS, RVT,  RDCS  Examination Guidelines: A complete evaluation includes B-mode imaging, spectral Doppler, color Doppler, and power Doppler as needed  of all accessible portions of each vessel. Bilateral testing is considered an integral part of a complete examination. Limited examinations for reoccurring indications may be performed as noted. The reflux portion of the exam is performed with the patient in reverse Trendelenburg.  +---------+---------------+---------+-----------+----------+--------------+ LEFT     CompressibilityPhasicitySpontaneityPropertiesThrombus Aging +---------+---------------+---------+-----------+----------+--------------+ CFV      Full           Yes      Yes                                 +---------+---------------+---------+-----------+----------+--------------+ SFJ      Full                                                        +---------+---------------+---------+-----------+----------+--------------+ FV Prox  Full                                                        +---------+---------------+---------+-----------+----------+--------------+ FV Mid   Full                                                        +---------+---------------+---------+-----------+----------+--------------+ FV DistalFull                                                        +---------+---------------+---------+-----------+----------+--------------+ PFV      Full                                                        +---------+---------------+---------+-----------+----------+--------------+ POP      Full           Yes      Yes                                 +---------+---------------+---------+-----------+----------+--------------+ PTV      Full                                                        +---------+---------------+---------+-----------+----------+--------------+ PERO     Full                                                         +---------+---------------+---------+-----------+----------+--------------+  Summary: LEFT: - There is no evidence of deep vein thrombosis in the lower extremity.  - No cystic structure found in the popliteal fossa.  *See table(s) above for measurements and observations.    Preliminary     Procedures Procedures (including critical care time)  Medications Ordered in ED Medications  vancomycin (VANCOCIN) IVPB 1000 mg/200 mL premix (has no administration in time range)  diltiazem (CARDIZEM) injection 10 mg (has no administration in time range)  lactated ringers bolus 1,000 mL (has no administration in time range)  piperacillin-tazobactam (ZOSYN) IVPB 3.375 g (3.375 g Intravenous New Bag/Given 01/31/20 1343)  sodium chloride 0.9 % bolus 500 mL (500 mLs Intravenous New Bag/Given 01/31/20 1415)    ED Course  I have reviewed the triage vital signs and the nursing notes.  Pertinent labs & imaging results that were available during my care of the patient were reviewed by me and considered in my medical decision making (see chart for details).    MDM Rules/Calculators/A&P                      69 year old gentleman presenting to the ED with worsened left lower extremity chronic leg wound.  He is tachycardic but does not have other sirs criteria.  Subjectively, on exam his leg wound has the appearance of a poorly controlled chronic issue related to lymphedema/venous stasis.  No obvious focality of infection, no purulence.  Initial labs are generally at his baseline with the exception of a lactic acid of 4.  There is some suspicion for an erroneous lab.  He does not technically meet SIRS criteria.  We will plan for repeat lactic acid x-ray of the left lower extremity and initial dose of IV antibiotics.  Will plan for admission if repeat lactic acid if remains consistently elevated.  Repeat exam shows worsened tachycardia.  EKG shows new onset A. fib.  Cardizem started.  Cardiology consulted.  Repeat  lactate 2.9.  The presence of new onset A. fib and persistently elevated lactic acid, will admit for SIRS response to cellulitis.  Discussed with hospitalist for admission. Final Clinical Impression(s) / ED Diagnoses Final diagnoses:  Left leg cellulitis  Atrial fibrillation, unspecified type ([place])  Cellulitis of left lower extremity    Rx / DC Orders ED Discharge Orders    None       Matilde Haymaker, MD 01/31/20 1528    Matilde Haymaker, MD 01/31/20 1528    Drenda Freeze, MD 02/01/20 [phone]

## 2020-01-31 NOTE — Plan of Care (Signed)
?  Problem: Clinical Measurements: ?Goal: Ability to avoid or minimize complications of infection will improve ?Outcome: Progressing ?  ?Problem: Skin Integrity: ?Goal: Skin integrity will improve ?Outcome: Progressing ?  ?

## 2020-01-31 NOTE — H&P (Signed)
Date: 01/31/2020               Patient Name:  [name]. MRN: [id number]  DOB: [birth year] Age / Sex: 69 y.o., male   PCP: Aura Dials, MD         Medical Service: Internal Medicine Teaching Service         Attending Physician: Dr. Evette Doffing, Mallie Mussel, *    First Contact: Marianna Payment DO, Marland Kitchen Pager: Meriel Flavors [phone])  Second Contact: Eileen Stanford, MD, Obed Pager: OA [phone])       After Hours (After 5p/  First Contact Pager: [phone]  weekends / holidays): Second Contact Pager: [phone]   Chief Complaint: Left lower Extremity Wound  History of Present Illness:    [name] is a 69 year old male with a pertinent past medical history of type 2 diabetes, hypertension, diastolic heart failure, chronic lymphedema/venous stasis who presented with chronic left lower extremity wound and was told to come to the hospital by his wound care provider.  Patient states that he has had this chronic lower extremity wound for several months now.  Wound care provider has treated this lower extremity swelling with diuretics and wound wraps with minimal success.  Patient states that he does have left lower extremity pain that he characterizes as burning that is isolated to his left leg.  He has previously seen a pain specialist for this swelling and states that all of his work-up there was normal.  He denies any fever, purulent drainage, significant increase in pain, or recent travel.  Patient states that he has not had any recent changes in medication but does admit to frequently missing his daily medications stating that he just forgets.  He denies any chest pain, shortness of breath, abdominal pain/bloating, orthopnea, PND.  ED Course: In the ED the patient was found to have a pulse of 150 with EKG showing irregularly irregular rhythm. He was given a one-time dose of diltiazem 10 mg injection and started on IV vancomycin with improvement heart rate back in the 80s and spontaneous conversion  back to sinus rhythm.    Lab Orders     Culture, blood (Routine x 2)     SARS CORONAVIRUS 2 (TAT 6-24 HRS) Nasopharyngeal Nasopharyngeal Swab     Comprehensive metabolic panel     Lactic acid, plasma     CBC with Differential     Protime-INR     Lactic acid, plasma     TSH     Hemoglobin A1c     HIV Antibody (routine testing w rflx)     CBC     Basic metabolic panel     Heparin level (unfractionated)     Heparin level (unfractionated)     CBC   Meds:  Current Meds  Medication Sig  . Cholecalciferol (VITAMIN D3) 5000 UNITS TABS Take 1 tablet by mouth daily. Reported on 04/29/2016  . finasteride (PROSCAR) 5 MG tablet Take 5 mg by mouth daily.  . fluticasone (FLONASE) 50 MCG/ACT nasal spray Place 2 sprays into both nostrils daily as needed for allergies or rhinitis.  Marland Kitchen GABAPENTIN PO Take 800 mg by mouth daily as needed (pain).   Marland Kitchen glipiZIDE (GLUCOTROL XL) 5 MG 24 hr tablet Take 10 mg by mouth daily with breakfast.   . potassium chloride SA (KLOR-CON) 20 MEQ tablet Take 1 tablet by mouth daily  . torsemide (DEMADEX) 20 MG tablet Take 2 tablets (40 mg total) by mouth daily.  Social:  Living arrangements: Lives in [place] with his wife Occupation: Patient is retired Therapist, nutritional Substance use: Occasionally drinks alcohol, denies back or products, denies illicit drugs   Social History   Socioeconomic History  . Marital status: Married    Spouse name: Not on file  . Number of children: 3  . Years of education: Not on file  . Highest education level: Not on file  Occupational History  . Not on file  Tobacco Use  . Smoking status: Never Smoker  . Smokeless tobacco: Former Network engineer and Sexual Activity  . Alcohol use: Yes    Alcohol/week: 0.0 standard drinks    Comment: occasionally beer or scotch  . Drug use: No  . Sexual activity: Yes  Other Topics Concern  . Not on file  Social History Narrative  . Not on file   Social Determinants of Health    Financial Resource Strain:   . Difficulty of Paying Living Expenses:   Food Insecurity:   . Worried About Charity fundraiser in the Last Year:   . Arboriculturist in the Last Year:   Transportation Needs:   . Film/video editor (Medical):   Marland Kitchen Lack of Transportation (Non-Medical):   Physical Activity:   . Days of Exercise per Week:   . Minutes of Exercise per Session:   Stress:   . Feeling of Stress :   Social Connections:   . Frequency of Communication with Friends and Family:   . Frequency of Social Gatherings with Friends and Family:   . Attends Religious Services:   . Active Member of Clubs or Organizations:   . Attends Archivist Meetings:   Marland Kitchen Marital Status:   Intimate Partner Violence:   . Fear of Current or Ex-Partner:   . Emotionally Abused:   Marland Kitchen Physically Abused:   . Sexually Abused:      Family History:  Family History  Problem Relation Age of Onset  . Benign prostatic hyperplasia Father   . Kidney cancer Neg Hx   . Kidney disease Neg Hx    Allergies: Allergies as of 01/31/2020 - Review Complete 01/31/2020  Allergen Reaction Noted  . Codeine Swelling 03/06/2013  . Sulfur Other (See Comments) 01/31/2020   Past Medical History:  Diagnosis Date  . Arthritis   . Back pain    occasionally  . BPH (benign prostatic hypertrophy)    takes Uroxatral daily as well as Finasteride   . Diabetes mellitus without complication ([place])    takes Metformin daily  . Diabetic foot infection ([place]) 10/22/2019  . GERD (gastroesophageal reflux disease)    no meds  . Hepatitis hx of   at age 11  . Hypertension    takes Losartan and HCTZ daily  . Joint pain   . Lymphedema 10/22/2019  . Urinary frequency   . Urinary urgency   . Venous stasis dermatitis 11/19/2019     Review of Systems: A complete ROS was negative except as per HPI.   Physical Exam: Blood pressure 127/64, pulse 97, temperature 98.3 F (36.8 C), temperature source Oral, resp. rate 17,  height 5\' 9"  (1.753 m), weight 136.1 kg, SpO2 96 %. Physical Exam       Labs: CBC    Component Value Date/Time   WBC 9.9 01/31/2020 1158   RBC 4.24 01/31/2020 1158   HGB 12.5 (L) 01/31/2020 1158   HCT 40.2 01/31/2020 1158   PLT 246 01/31/2020 1158   MCV  94.8 01/31/2020 1158   MCH 29.5 01/31/2020 1158   MCHC 31.1 01/31/2020 1158   RDW 13.7 01/31/2020 1158   LYMPHSABS 1.3 01/31/2020 1158   MONOABS 0.8 01/31/2020 1158   EOSABS 0.3 01/31/2020 1158   BASOSABS 0.1 01/31/2020 1158     CMP     Component Value Date/Time   NA 139 01/31/2020 1158   NA 142 07/20/2018 1042   K 3.8 01/31/2020 1158   CL 102 01/31/2020 1158   CO2 24 01/31/2020 1158   GLUCOSE 298 (H) 01/31/2020 1158   BUN 11 01/31/2020 1158   BUN 25 07/20/2018 1042   CREATININE 1.32 (H) 01/31/2020 1158   CREATININE 1.39 (H) 10/22/2019 0941   CALCIUM 8.2 (L) 01/31/2020 1158   PROT 7.0 01/31/2020 1158   ALBUMIN 3.0 (L) 01/31/2020 1158   AST 25 01/31/2020 1158   ALT 18 01/31/2020 1158   ALKPHOS 75 01/31/2020 1158   BILITOT 1.0 01/31/2020 1158   GFRNONAA 54 (L) 01/31/2020 1158   GFRNONAA 51 (L) 10/22/2019 0941   GFRAA >60 01/31/2020 1158   GFRAA 59 (L) 10/22/2019 0941    Imaging: Chest XR: 1. No acute bone abnormality. 2. Marked diffuse soft tissue swelling. Compatible with the clinical history of cellulitis   VAS [place] LOWER EXTREMITY VENOUS (DVT): There is no evidence of deep vein thrombosis in the lower extremity.  - No cystic structure found in the popliteal fossa.  EKG: personally reviewed my interpretation is ventricular tachycardia at 151 bpm with an irregularly irregular rhythm  Assessment & Plan by Problem: Active Problems:   Cellulitis of left leg   [name] Khian Remo. is a 69 y.o. with pertinent PMH of type 2 diabetes, hypertension, diastolic heart failure, chronic lymphedema/venous stasis  who presented with lower extremity pain, swelling, and redness and admit for left lower extremity cellulitis  secondary to chronic edema on hospital day 0  #Nonpurulent Cellulitis Patient presents with chronic lower extremity edema with redness, erythema, and warmth.  Vascular ultrasound of his left lower extremity today showed no signs of DVT.  Patient states that he was previously worked up a vascular Clinic without signs of venous insufficiency.  Patient states that he does have mild heart failure with his most recent echo on 07/2019 showing moderate concentric hypertrophy of the left ventricle ejection fraction of 50%.  There is no global wall motion abnormalities with mild diastolic dysfunction..  Patient's symptoms are consistent with mild-moderate nonpurulent cellulitis without significant risk factors for MRSA.  Denies any recent antibiotic use, IV drug use, or history of MRSA infections. -Start Ancef 1 g / 15 mL IV for 5 to 10 days.  Likely will switch to orals once we see clinical improvement. -Continue torsemide for lower extremity edema. -Start compression stockings tomorrow if erythema improves.   #Atrial fibrillation: Patient presents with a self terminating episode of atrial fibrillation with rapid ventricular response in the 150s.  Patient has never had a previous of atrial fibrillation.  Although he does have a lower left extremity cellulitis he does not appear to be septic.  He denies any chronic alcohol use or previously been worked up for hypothyroidism.  She does have longstanding hypertension and admits to not always being compliant with his medications.  He states that he was previously worked up for obstructive sleep apnea and was told that he is borderline.  He is not currently on a CPAP machine.  Denies ever having any ischemic heart disease. He does have a normocytic anemia  on Cbc that appears to be chronic. Patient was given a single dose of Cardizem 10 mg and then started on heparin and metoprolol tartrate 25 mg twice daily.  Cardiology was consulted in the ED for recommendations  regarding cardioversion versus medical management.  -Continue heparin for anticoagulation -Continue metoprolol 25 mg twice daily -Appreciate cardiology's recommendations -TSH pending  -Echocardiogram pending  #Diabetes mellitus, type II Patient takes glipizide 10 mg daily at home.  We will start him on sliding scale insulin and Lantus 10 units nightly and titrate insulin as needed.  Patient states his most recent A1c was 7.4.  We will repeat his A1c during this hospitalization. -Hemoglobin A1c pending  #Normocytic anemia: Patient presents with a normocytic anemia that has been present since 5 years ago.  I do not see any evidence in the chart where his been worked up.  We will perform anemia studies today to determine etiology of his normocytic anemia.  Considering his longstanding elevation and creatinine is likely secondary to his chronic kidney disease. -Iron/TIBC pending  -Ferritin pending -B12/folate pending -Reticulocyte count pending  #HTN: Patient does not appear to be taking any antihypertensive medications at this time.  His most recent blood pressures 130/60s.  Considering his history of diabetes and chronic kidney disease patient would likely benefit from starting low-dose ACE/ARB.   #CKD stage II: patient has a history of chronic kidney disease with a creatinine around 1.3 and GFR of 60 and above indicating stage II CKD.  -Avoid nephrotoxic medications when possible - Patient may benefit from low-dose ACE/ARB  Diet: Carb-Modified VTE: Heparin IVF: LR,Bolus Code: Full  Prior to Admission Living Arrangement: Home, living Wife Anticipated Discharge Location: Home Barriers to Discharge: Continuing medical management  Dispo: Admit patient to Observation with expected length of stay less than 2 midnights.  Signed: Marianna Payment, MD 01/31/2020, 4:08 PM  Pager: [phone]

## 2020-01-31 NOTE — ED Notes (Signed)
Per Dr Darl Householder, draw lactic at this time, prior to antibiotics

## 2020-01-31 NOTE — Progress Notes (Addendum)
JUANPABLO, CIRESI ([id number]) Visit Report for 01/31/2020 Chief Complaint Document Details Patient Name: [name], [name]. Date of Service: 01/31/2020 8:30 AM Medical Record Number: [id number] Patient Account Number: [id number] Date of Birth/Sex: 1950-12-24 (69 y.o. M) Treating RN: Cornell Barman Primary Care Provider: Aura Dials Other Clinician: Referring Provider: Aura Dials Treating Provider/Extender: Melburn Hake, Leslee Haueter Weeks in Treatment: 18 Information Obtained from: Patient Chief Complaint Bilateral LE lymphedema left leg cellulitis Electronic Signature(s) Signed: 01/31/2020 8:50:28 AM By: Worthy Keeler PA-C Entered By: Worthy Keeler on 01/31/2020 08:50:28 Wild, [name] ([id number]) -------------------------------------------------------------------------------- HPI Details Patient Name: [name]. Date of Service: 01/31/2020 8:30 AM Medical Record Number: [id number] Patient Account Number: [id number] Date of Birth/Sex: 1950-12-31 (69 y.o. M) Treating RN: Cornell Barman Primary Care Provider: Aura Dials Other Clinician: Referring Provider: Aura Dials Treating Provider/Extender: Melburn Hake, Eloyse Causey Weeks in Treatment: 18 History of Present Illness HPI Description: 09/27/2019 on evaluation today patient appears to be doing poorly in regard to his bilateral lower extremities he does have bilateral lower extremity lymphedema. With that being said he also has a history of diabetes type 2, hypertension, congestive heart failure, and apparently has chronic venous insufficiency which has led to the lymphedema. Currently he also shows signs of an infection of the left lower extremity which does have me concerned at this point as well. He tells me that he is not really having any significant pain but has had a lot of trouble with his legs in general. He does have a compression stocking he is wearing on the right lower extremity he was wrapped with an Unna boot on the left  lower extremity. The patient tells me that overall he has been doing with this for several weeks and home health has been initiated. He has not been on any antibiotics yet and he tells me that the home health nurse was not sure that it was infected based on what I see today I am concerned that this likely is infected at this point. Fortunately however there does not appear to be any evidence of systemic infection which is good news. The Unna boot does seem to be helping control the edema although he may benefit from a stronger compression wrap I think that at this point without having the ABIs completed the Unna boot is where I would stand. He is supposed to be having an arterial study with ABI as ordered by his cardiologist but he states he may have missed that appointment and has not called to reschedule he needs to contact them to get this rescheduled as soon as possible I explained to him. He tells me that he is draining a lot as far as the left lower extremity is concerned and that is something that he feels like is not lasting very long as far as the wraps are concerned when they put him on they seem to get wet extremely quickly. 10/04/2019 on evaluation today patient appears to be doing better compared to last week's evaluation. Fortunately there is no signs of active infection at this time. He has been tolerating the dressing changes without complication. He did undergo arterial studies yesterday and has an appointment tomorrow with the vascular specialist. With that being said his ABI on the left was 1.25 on the right 1.19 with a TBI of 0.61 on the left and 0.69 on the right. There was stated to be no evidence of obvious significant arterial disease and the patient had pretty much triphasic blood flow throughout. Overall  I am very pleased with how things seem to be progressing. We will see if the vascular doctor says anything different tomorrow but I even at this point I feel like he would do  well with a stronger compression wrap to try to get some the fluid out of his leg at this point. The good news is his drainage has slowed down considerably I do believe the antibiotics have been beneficial for him. 10/18/2019 on evaluation today patient appears to be doing poorly in regard to his left lower extremity. He still having a lot of drainage and weeping from his leg this appears to be much more erythematous and in fact I am concerned that the erythema may be spreading up his leg as well. He has not experienced any fevers, chills, nausea, vomiting, or diarrhea up to this point. I explained to him that I really feel like based on what I am seeing he may need to potentially go to the hospital for evaluation and treatment potentially even with IV antibiotics depending on what they see. With that being said he tells me that he is not really able to do that due to the fact that he is the primary caregiver for his wife who is legally blind. He states he is not good to be able to have anything to help as far as caring for her if he were to go into the hospital. Nonetheless he is unsure what to do in this regard. I really think that he needs faster treatment. For that reason I am going to likely try to get in touch with the infectious disease office to see if I can get him in sooner than [place]. 10/25/2019 on evaluation today patient appears to be doing a little better in regard to his weeping and edema compared to the last time I saw him. The dorsal surface of his foot is a little bit drier compared to what was this is good news. He did see Dr. Drucilla Schmidt on 10/22/2019. Dr. Drucilla Schmidt did feel that potentially this could be secondary to some cellulitis and he recommended subsequently placing the patient on Zyvox which the patient feels like has helped as well. There does not appear to be any signs of active infection at this time which is good news systemically. I am very pleased that Dr. Drucilla Schmidt was able  to see him so quickly I do appreciate this greatly. With that being said he also did recommend an MRI for the patient to evaluate for any deeper signs of infection. That is actually scheduled for this coming Tuesday. He also has a follow-up appoint with Dr. Drucilla Schmidt on Tuesday. The last thing Dr. Drucilla Schmidt did mention was the possibility of more aggressive diuresis. The patient is on torsemide. With that being said this is managed by his cardiologist. I think we may want to get in touch with her clinic and see if possibly this is something that could be increased for a short amount of time depending on their recommendations to see if we can get some additional fluid off of him which in turn would likely help tremendously with his weeping and edema. He is in agreement with this plan if his cardiologist is in agreement. 11/01/2019 on evaluation today patient unfortunately notes that he did not get his MRI done which was ordered by Dr. Drucilla Schmidt. Apparently when he went in it was 6:30 in the morning and they told him that he was there to get an MRI of his toes. With that  being said the MRI was ordered of his ankle and foot and so long story short the patient said that he was not getting MRI of his toes and left. Dr. Drucilla Schmidt was alerted and again it does not appear that at all that was what he ordered that he did order the ankle and foot but nonetheless that is beside the point at this time. The patient still has considerable edema noted at this time the nurse that came out last after just put a Kerlix and Coban wrap on the patient she did not even properly wrap him. Subsequently they have also discharged him being that they state he is not skilled at this point. Therefore he is going to have to be seen here in the office only. Fortunately there is no signs of systemic infection he still has a lot of erythema around the ankle and lower extremity region again there are any specific open wounds just general weeping  and evidence of erythema which Dr. Drucilla Schmidt is questioning whether or not this is even infection or if it is just more secondary to the patient's lymphedema. He has had venous reflux studies which were negative for any abnormal findings unfortunately that is also not good to be an answer for [place] here. I have recommended the patient today contact his primary care provider to see if they can increase his fluid pills and I also think we need to increase his compression wrap to a 4-layer compression wrap to try to more effectively manage his edema. 11/08/2019 on evaluation today patient appears unfortunately to still be doing poorly. I did speak with his primary care provider office last week they were supposed to call him to advise what should be done as far as his fluid pills were concerned. With that being said they did not contact him as far as the patient tells me. I am not really sure exactly what is going on as far as that is concerned. Nonetheless I really think that the patient needs to be admitted to the hospital for treatment likely as I explained to him he is going to require IV Lasix to get fluid off and to be honest also think he may need [name], [name]. ([id number]) IV antibiotics. I have made all the outpatient referrals I can to try to get things under control he has been on oral antibiotics I really just have not seen any significant improvement he was even on linezolid. That was prescribed By infectious disease. Overall I am very concerned about the fact that this is not getting better. 11/22/2019 on evaluation today patient unfortunately is not doing very well with regard to his lower extremity edema. The left lower extremity is significantly swollen and still significantly erythematous. He has bright green drainage. He did just see Dr. Drucilla Schmidt on 11/19/2019 and Dr. Drucilla Schmidt did not feel like that this was infected. Subsequently he states that he feels like the issue is more 1 of not being at  maximal diuresis and recommended the patient see his cardiologist to ensure that this was achieved. Subsequently Dr. Drucilla Schmidt states this is a issue with venous stasis dermatitis which again I completely understand that that may definitely be the case but unfortunately we've not been able to get things under control even with maximum compression at a 4 layer compression wrap. The patient is still having significant issues. Either way I do think the patient is getting need to follow-up with his cardiologist to see what we can do Dr. Drucilla Schmidt  did make mention that the patient had a flareup of infection that Keflex 500 mg 4 times a day or Augmentin twice a day would be good options for nonpurulent cellulitis. The patient was again supposed to have gone to the hospital on the weekend following Christmas after he got things settled with family as far as getting someone to take care of his wife. With that being said he tells me that when it came around to that time he just didn't think about it. When asked him about why the drainage from his leg and the issues he is having that and remind him he told me "well it really hasn't changed". 11/29/2019 on evaluation today patient presents for follow-up concerning his left lower extremity edema and weeping. He did see his cardiologist at my request after I discussed with him the situation as well. This is [name] who is a family Designer, jewellery that he saw. Subsequently the patient was recommended to start metolazone 5 mg daily for the next 5 days. They also recommended that really he needed essentially bed rest with elevation of his legs over the next week. With that being said the patient states that he is unable to even get in bed due to the fact that he has back trouble and has to sleep in his recliner. He never really gets in the bed at all. This is probably part of the issue coupled with his heart not functioning quite as well as what we would like to see  and then subsequently he also has the issue of being overweight which contributes to this as well. Overall were still trying to figure out how to best manage this to get his tweaking under control and prevent anything from worsening. Again Dr. Drucilla Schmidt really did not feel like there was any evidence of infectious processes going on at this point. 12/06/2019 on evaluation today patient's leg actually appears to be doing better with regard to edema control I am actually pleased in this regard. He also seems to be improving as far as drainage is concerned from the leg this is much better than what it was in the past. Fortunately there is no signs of active infection which is good news and overall the patient states he is actually happier with where things stand at this point. 12/13/2019 on evaluation today patient unfortunately is very dry with cracked skin noted at this point. Fortunately there is no signs of active infection at this time. With that being said I think he is really needs some moisturizing lotion something really good to counter loosen up the dry cracked skin I think even if 40% urea cream would be ideal at this point. I discussed this with him today. In fact even look this up on his phone so that he can order it if he needs to although he may be able to find it locally I am just not sure. I told him to check with his pharmacy first. 12/20/2019 on evaluation today patient unfortunately continues to have issues with swelling of his left lower extremity. We did use Tubigrip last week to try to give him a chance to be able to take this off and take a shower and then reapply it. With that being said he just cut it off and then did not have anything to reapply he did that on Friday after we saw him on Thursday and had nothing on in the interim for almost an entire week since. Obviously that was not  the direction that was given to him and when I asked him about it he stated that he just could not bend  over to get off and he does not have anyone to help him. With that being said I do not think this is can be possible to have him take this off to take a shower simply due to the fact that again were not going to be able to keep the edema under control and he can get this off and back on it hurts his back way too much. 12/27/2019 on evaluation today patient actually appears to be mostly dry in regard to his lower extremity wounds in general. Fortunately there is no signs of active infection at this time. No fever chills noted. I really do not see anything open at this point which is good news. He does have a lot of dry skin that is flaking off. With that being said I think that likely I recommend wrapping him 1 more week and we can utilize the objective coating layer from the Profore wraps in order to prevent anything from sticking to the wound bed. After that I am hopeful we will be able to get him into the compression socks that he has he supposed to bring 1 of those with him next week he also is supposed to switch out and start using the new ones on his right leg as when he has is completely worn out. 01/10/2020 upon evaluation today patient appears to be doing poorly at this point in regard to his lower extremities on the left. He still continues to have significant weeping he actually took the wrap off 2 days ago because he stated "we were planning to take it off today anyway". He did not however put any compression stockings on but obviously was not part of the plan. Still he continues to have significant swelling I really do not know that this is going to make a significant improvement and less were able to keep him well compressed all the time and again also get his edema under good control. He was supposed to go back to see his cardiologist although I do not think that is actually been an appointment that he has made at this point that I can find anyway. He also is still taking the double dose of  his Lasix but again I am not sure that that is doing the job here. 01/17/2020 upon evaluation today patient appears to be doing quite well with regard to his leg all things considered. This is better than last week. With that being said he still has significant edema and weeping unfortunately this is going to continue to be an issue. There is no signs of active infection. He tells me that he got a new prescription for torsemide. To be honest I was not quite sure that he had been prescribed that yet I thought he had been prescribed Lasix but either way I think there may be a little bit of confusion here. I discussed this with the patient today as well. I also discussed with the patient the fact that I do believe he is going to likely require additional follow-up with his cardiologist. I suggested he give them a call to make an appointment. He told me that he called them last week but he did not make an appointment. I am not exactly sure what he called in for or what he was looking for out of that. Nonetheless there does not appear to be  any signs of active infection at this time. No fevers, chills, nausea, vomiting, or diarrhea. 01/24/2020 upon evaluation today patient appears to be doing poorly in regard to his left lower extremity. He has bright green almost teal colored drainage which is very consistent with likely Pseudomonas. He also has a Pseudomonas-like smell to the drainage noted at this point. Fortunately there is no signs of active systemic infection at this time. No fevers, chills, nausea, vomiting, or diarrhea. With that being said I explained to the patient that this can change quickly that he can definitely become septic as a result of what we are seeing here if he does not treat this appropriately. With that being said I really think he needs to be in the ER as soon as possible in fact I really want him to go today he tells me there is no way he is likely to be able to get that set up in  time. He takes care of his wife who is legally blind and I completely understand the constraints there nonetheless he is going to take care of himself if he is good to be around to take care of her. [name], [name] ([id number]) 01/31/2020 upon evaluation today patient unfortunately appears to be doing poorly yet again. There is no signs of systemic infection yet although that is a big concern for me at this point to be honest. No fevers, chills, nausea, vomiting, or diarrhea. With regard to the oral antibiotics at this point the patient does have a prescription for trazodone which I was under the impression he was taking. He states that he is not really sure that he is taking that on a regular basis or not. To be honest is on his list he tells me that in his pill bottle he has set pills he takes every day and he is really unsure of exactly what he takes. He needs to go back and have a look at that and try to figure things out. Either way if we were to place him on something like Cipro or Levaquin with him still being on trazodone that can prolong the QT interval which can be also dangerous. I am not willing to do that. The patient really needs to go to the hospital for in my opinion IV antibiotic therapy. He was positive for Pseudomonas and when he came in today his dressing was completely saturated with blue-green drainage consistent with Pseudomonas as well that I saw even before I saw the results of the culture and already knew what we were dealing with. Electronic Signature(s) Signed: 01/31/2020 9:00:14 AM By: Worthy Keeler PA-C Entered By: Worthy Keeler on 01/31/2020 09:00:13 Creson, [name] ([id number]) -------------------------------------------------------------------------------- Physical Exam Details Patient Name: [name], [name]. Date of Service: 01/31/2020 8:30 AM Medical Record Number: [id number] Patient Account Number: [id number] Date of Birth/Sex: Aug 19, 1951 (69 y.o. M) Treating  RN: Cornell Barman Primary Care Provider: Aura Dials Other Clinician: Referring Provider: Aura Dials Treating Provider/Extender: Melburn Hake, Onisha Cedeno Weeks in Treatment: 85 Constitutional Well-nourished and well-hydrated in no acute distress. Respiratory normal breathing without difficulty. Psychiatric this patient is able to make decisions and demonstrates good insight into disease process. Alert and Oriented x 3. pleasant and cooperative. Notes Patient's left lower extremity appears to be macerated and broken down there is no open wounds per se but at the same time he just does not seem to be making any progress with this I think he really needs IV antibiotic therapy to be  honest I think IV diuretics would also be of benefit. This has been discussed with the patient actually since Christmas 2020 and have yet been able to get him to go to the hospital for appropriate treatment. Electronic Signature(s) Signed: 01/31/2020 9:00:43 AM By: Worthy Keeler PA-C Entered By: Worthy Keeler on 01/31/2020 09:00:43 Fasching, [name] ([id number]) -------------------------------------------------------------------------------- Physician Orders Details Patient Name: [name]. Date of Service: 01/31/2020 8:30 AM Medical Record Number: [id number] Patient Account Number: [id number] Date of Birth/Sex: Mar 22, 1951 (69 y.o. M) Treating RN: Montey Hora Primary Care Provider: Aura Dials Other Clinician: Referring Provider: Aura Dials Treating Provider/Extender: Melburn Hake, Aarti Mankowski Weeks in Treatment: 66 Verbal / Phone Orders: No Diagnosis Coding ICD-10 Coding Code Description I89.0 Lymphedema, not elsewhere classified I87.2 Venous insufficiency (chronic) (peripheral) E11.628 Type 2 diabetes mellitus with other skin complications T55 Essential (primary) hypertension I50.42 Chronic combined systolic (congestive) and diastolic (congestive) heart failure L02.416 Cutaneous abscess of left  lower limb Wound Cleansing o Clean wound with Normal Saline. - in office o Cleanse wound with mild soap and water - left leg Primary Wound Dressing o Other: - ABD Secondary Dressing o ABD and Kerlix/Conform Follow-up Appointments o Return Appointment in 1 week. Electronic Signature(s) Signed: 01/31/2020 4:33:07 PM By: Montey Hora Signed: 01/31/2020 5:16:18 PM By: Worthy Keeler PA-C Entered By: Montey Hora on 01/31/2020 08:59:41 Nokes, [name] ([id number]) -------------------------------------------------------------------------------- Problem List Details Patient Name: [name], [name]. Date of Service: 01/31/2020 8:30 AM Medical Record Number: [id number] Patient Account Number: [id number] Date of Birth/Sex: February 18, 1951 (69 y.o. M) Treating RN: Cornell Barman Primary Care Provider: Aura Dials Other Clinician: Referring Provider: Aura Dials Treating Provider/Extender: Melburn Hake, Stephanieann Popescu Weeks in Treatment: 19 Active Problems ICD-10 Evaluated Encounter Code Description Active Date Today Diagnosis I89.0 Lymphedema, not elsewhere classified 09/27/2019 No Yes I87.2 Venous insufficiency (chronic) (peripheral) 09/27/2019 No Yes E11.628 Type 2 diabetes mellitus with other skin complications 31/51/7616 No Yes I10 Essential (primary) hypertension 09/27/2019 No Yes I50.42 Chronic combined systolic (congestive) and diastolic (congestive) heart 09/27/2019 No Yes failure L02.416 Cutaneous abscess of left lower limb 09/27/2019 No Yes Inactive Problems Resolved Problems Electronic Signature(s) Signed: 01/31/2020 8:49:24 AM By: Worthy Keeler PA-C Entered By: Worthy Keeler on 01/31/2020 08:49:23 Coss, [name] ([id number]) -------------------------------------------------------------------------------- Progress Note Details Patient Name: [name]. Date of Service: 01/31/2020 8:30 AM Medical Record Number: [id number] Patient Account Number: [id number] Date of  Birth/Sex: 23-Jul-1951 (69 y.o. M) Treating RN: Cornell Barman Primary Care Provider: Aura Dials Other Clinician: Referring Provider: Aura Dials Treating Provider/Extender: Melburn Hake, Brooksie Ellwanger Weeks in Treatment: 18 Subjective Chief Complaint Information obtained from Patient Bilateral LE lymphedema left leg cellulitis History of Present Illness (HPI) 09/27/2019 on evaluation today patient appears to be doing poorly in regard to his bilateral lower extremities he does have bilateral lower extremity lymphedema. With that being said he also has a history of diabetes type 2, hypertension, congestive heart failure, and apparently has chronic venous insufficiency which has led to the lymphedema. Currently he also shows signs of an infection of the left lower extremity which does have me concerned at this point as well. He tells me that he is not really having any significant pain but has had a lot of trouble with his legs in general. He does have a compression stocking he is wearing on the right lower extremity he was wrapped with an Unna boot on the left lower extremity. The patient tells me that overall he has been doing with this for  several weeks and home health has been initiated. He has not been on any antibiotics yet and he tells me that the home health nurse was not sure that it was infected based on what I see today I am concerned that this likely is infected at this point. Fortunately however there does not appear to be any evidence of systemic infection which is good news. The Unna boot does seem to be helping control the edema although he may benefit from a stronger compression wrap I think that at this point without having the ABIs completed the Unna boot is where I would stand. He is supposed to be having an arterial study with ABI as ordered by his cardiologist but he states he may have missed that appointment and has not called to reschedule he needs to contact them to get this  rescheduled as soon as possible I explained to him. He tells me that he is draining a lot as far as the left lower extremity is concerned and that is something that he feels like is not lasting very long as far as the wraps are concerned when they put him on they seem to get wet extremely quickly. 10/04/2019 on evaluation today patient appears to be doing better compared to last week's evaluation. Fortunately there is no signs of active infection at this time. He has been tolerating the dressing changes without complication. He did undergo arterial studies yesterday and has an appointment tomorrow with the vascular specialist. With that being said his ABI on the left was 1.25 on the right 1.19 with a TBI of 0.61 on the left and 0.69 on the right. There was stated to be no evidence of obvious significant arterial disease and the patient had pretty much triphasic blood flow throughout. Overall I am very pleased with how things seem to be progressing. We will see if the vascular doctor says anything different tomorrow but I even at this point I feel like he would do well with a stronger compression wrap to try to get some the fluid out of his leg at this point. The good news is his drainage has slowed down considerably I do believe the antibiotics have been beneficial for him. 10/18/2019 on evaluation today patient appears to be doing poorly in regard to his left lower extremity. He still having a lot of drainage and weeping from his leg this appears to be much more erythematous and in fact I am concerned that the erythema may be spreading up his leg as well. He has not experienced any fevers, chills, nausea, vomiting, or diarrhea up to this point. I explained to him that I really feel like based on what I am seeing he may need to potentially go to the hospital for evaluation and treatment potentially even with IV antibiotics depending on what they see. With that being said he tells me that he is not  really able to do that due to the fact that he is the primary caregiver for his wife who is legally blind. He states he is not good to be able to have anything to help as far as caring for her if he were to go into the hospital. Nonetheless he is unsure what to do in this regard. I really think that he needs faster treatment. For that reason I am going to likely try to get in touch with the infectious disease office to see if I can get him in sooner than [place]. 10/25/2019 on evaluation today patient appears  to be doing a little better in regard to his weeping and edema compared to the last time I saw him. The dorsal surface of his foot is a little bit drier compared to what was this is good news. He did see Dr. Drucilla Schmidt on 10/22/2019. Dr. Drucilla Schmidt did feel that potentially this could be secondary to some cellulitis and he recommended subsequently placing the patient on Zyvox which the patient feels like has helped as well. There does not appear to be any signs of active infection at this time which is good news systemically. I am very pleased that Dr. Drucilla Schmidt was able to see him so quickly I do appreciate this greatly. With that being said he also did recommend an MRI for the patient to evaluate for any deeper signs of infection. That is actually scheduled for this coming Tuesday. He also has a follow-up appoint with Dr. Drucilla Schmidt on Tuesday. The last thing Dr. Drucilla Schmidt did mention was the possibility of more aggressive diuresis. The patient is on torsemide. With that being said this is managed by his cardiologist. I think we may want to get in touch with her clinic and see if possibly this is something that could be increased for a short amount of time depending on their recommendations to see if we can get some additional fluid off of him which in turn would likely help tremendously with his weeping and edema. He is in agreement with this plan if his cardiologist is in agreement. 11/01/2019 on evaluation  today patient unfortunately notes that he did not get his MRI done which was ordered by Dr. Drucilla Schmidt. Apparently when he went in it was 6:30 in the morning and they told him that he was there to get an MRI of his toes. With that being said the MRI was ordered of his ankle and foot and so long story short the patient said that he was not getting MRI of his toes and left. Dr. Drucilla Schmidt was alerted and again it does not appear that at all that was what he ordered that he did order the ankle and foot but nonetheless that is beside the point at this time. The patient still has considerable edema noted at this time the nurse that came out last after just put a Kerlix and Coban wrap on the patient she did not even properly wrap him. Subsequently they have also discharged him being that they state he is not skilled at this point. Therefore he is going to have to be seen here in the office only. Fortunately there is no signs of systemic infection he still has a lot of erythema around the ankle and lower extremity region again there are any specific open wounds just general weeping and evidence of erythema which Dr. Drucilla Schmidt is questioning whether or not this is even infection or if it is just more secondary to the patient's lymphedema. He has had venous reflux studies which were negative for any abnormal findings unfortunately that is also not good to be an answer for [place] here. I have recommended the patient today contact his primary care provider to see if they can increase his fluid pills and I also think we need to increase his compression wrap to a 4-layer compression wrap to try to more effectively manage his edema. [name], [name] ([id number]) 11/08/2019 on evaluation today patient appears unfortunately to still be doing poorly. I did speak with his primary care provider office last week they were supposed to call him to advise  what should be done as far as his fluid pills were concerned. With that being said  they did not contact him as far as the patient tells me. I am not really sure exactly what is going on as far as that is concerned. Nonetheless I really think that the patient needs to be admitted to the hospital for treatment likely as I explained to him he is going to require IV Lasix to get fluid off and to be honest also think he may need IV antibiotics. I have made all the outpatient referrals I can to try to get things under control he has been on oral antibiotics I really just have not seen any significant improvement he was even on linezolid. That was prescribed By infectious disease. Overall I am very concerned about the fact that this is not getting better. 11/22/2019 on evaluation today patient unfortunately is not doing very well with regard to his lower extremity edema. The left lower extremity is significantly swollen and still significantly erythematous. He has bright green drainage. He did just see Dr. Drucilla Schmidt on 11/19/2019 and Dr. Drucilla Schmidt did not feel like that this was infected. Subsequently he states that he feels like the issue is more 1 of not being at maximal diuresis and recommended the patient see his cardiologist to ensure that this was achieved. Subsequently Dr. Drucilla Schmidt states this is a issue with venous stasis dermatitis which again I completely understand that that may definitely be the case but unfortunately we've not been able to get things under control even with maximum compression at a 4 layer compression wrap. The patient is still having significant issues. Either way I do think the patient is getting need to follow-up with his cardiologist to see what we can do Dr. Drucilla Schmidt did make mention that the patient had a flareup of infection that Keflex 500 mg 4 times a day or Augmentin twice a day would be good options for nonpurulent cellulitis. The patient was again supposed to have gone to the hospital on the weekend following Christmas after he got things settled with family as  far as getting someone to take care of his wife. With that being said he tells me that when it came around to that time he just didn't think about it. When asked him about why the drainage from his leg and the issues he is having that and remind him he told me "well it really hasn't changed". 11/29/2019 on evaluation today patient presents for follow-up concerning his left lower extremity edema and weeping. He did see his cardiologist at my request after I discussed with him the situation as well. This is [name] who is a family Designer, jewellery that he saw. Subsequently the patient was recommended to start metolazone 5 mg daily for the next 5 days. They also recommended that really he needed essentially bed rest with elevation of his legs over the next week. With that being said the patient states that he is unable to even get in bed due to the fact that he has back trouble and has to sleep in his recliner. He never really gets in the bed at all. This is probably part of the issue coupled with his heart not functioning quite as well as what we would like to see and then subsequently he also has the issue of being overweight which contributes to this as well. Overall were still trying to figure out how to best manage this to get his tweaking under control and  prevent anything from worsening. Again Dr. Drucilla Schmidt really did not feel like there was any evidence of infectious processes going on at this point. 12/06/2019 on evaluation today patient's leg actually appears to be doing better with regard to edema control I am actually pleased in this regard. He also seems to be improving as far as drainage is concerned from the leg this is much better than what it was in the past. Fortunately there is no signs of active infection which is good news and overall the patient states he is actually happier with where things stand at this point. 12/13/2019 on evaluation today patient unfortunately is very dry with  cracked skin noted at this point. Fortunately there is no signs of active infection at this time. With that being said I think he is really needs some moisturizing lotion something really good to counter loosen up the dry cracked skin I think even if 40% urea cream would be ideal at this point. I discussed this with him today. In fact even look this up on his phone so that he can order it if he needs to although he may be able to find it locally I am just not sure. I told him to check with his pharmacy first. 12/20/2019 on evaluation today patient unfortunately continues to have issues with swelling of his left lower extremity. We did use Tubigrip last week to try to give him a chance to be able to take this off and take a shower and then reapply it. With that being said he just cut it off and then did not have anything to reapply he did that on Friday after we saw him on Thursday and had nothing on in the interim for almost an entire week since. Obviously that was not the direction that was given to him and when I asked him about it he stated that he just could not bend over to get off and he does not have anyone to help him. With that being said I do not think this is can be possible to have him take this off to take a shower simply due to the fact that again were not going to be able to keep the edema under control and he can get this off and back on it hurts his back way too much. 12/27/2019 on evaluation today patient actually appears to be mostly dry in regard to his lower extremity wounds in general. Fortunately there is no signs of active infection at this time. No fever chills noted. I really do not see anything open at this point which is good news. He does have a lot of dry skin that is flaking off. With that being said I think that likely I recommend wrapping him 1 more week and we can utilize the objective coating layer from the Profore wraps in order to prevent anything from sticking to the  wound bed. After that I am hopeful we will be able to get him into the compression socks that he has he supposed to bring 1 of those with him next week he also is supposed to switch out and start using the new ones on his right leg as when he has is completely worn out. 01/10/2020 upon evaluation today patient appears to be doing poorly at this point in regard to his lower extremities on the left. He still continues to have significant weeping he actually took the wrap off 2 days ago because he stated "we were planning to take it  off today anyway". He did not however put any compression stockings on but obviously was not part of the plan. Still he continues to have significant swelling I really do not know that this is going to make a significant improvement and less were able to keep him well compressed all the time and again also get his edema under good control. He was supposed to go back to see his cardiologist although I do not think that is actually been an appointment that he has made at this point that I can find anyway. He also is still taking the double dose of his Lasix but again I am not sure that that is doing the job here. 01/17/2020 upon evaluation today patient appears to be doing quite well with regard to his leg all things considered. This is better than last week. With that being said he still has significant edema and weeping unfortunately this is going to continue to be an issue. There is no signs of active infection. He tells me that he got a new prescription for torsemide. To be honest I was not quite sure that he had been prescribed that yet I thought he had been prescribed Lasix but either way I think there may be a little bit of confusion here. I discussed this with the patient today as well. I also discussed with the patient the fact that I do believe he is going to likely require additional follow-up with his cardiologist. I suggested he give them a call to make an appointment.  He told me that he called them last week but he did not make an appointment. I am not exactly sure what he called in for or what he was looking for out of that. Nonetheless there does not appear to be any signs of active infection at this time. No fevers, chills, nausea, vomiting, or diarrhea. 01/24/2020 upon evaluation today patient appears to be doing poorly in regard to his left lower extremity. He has bright green almost teal colored drainage which is very consistent with likely Pseudomonas. He also has a Pseudomonas-like smell to the drainage noted at this point. Fortunately there is no signs of active systemic infection at this time. No fevers, chills, nausea, vomiting, or diarrhea. With that being said I explained to the patient [name], [name]. ([id number]) that this can change quickly that he can definitely become septic as a result of what we are seeing here if he does not treat this appropriately. With that being said I really think he needs to be in the ER as soon as possible in fact I really want him to go today he tells me there is no way he is likely to be able to get that set up in time. He takes care of his wife who is legally blind and I completely understand the constraints there nonetheless he is going to take care of himself if he is good to be around to take care of her. 01/31/2020 upon evaluation today patient unfortunately appears to be doing poorly yet again. There is no signs of systemic infection yet although that is a big concern for me at this point to be honest. No fevers, chills, nausea, vomiting, or diarrhea. With regard to the oral antibiotics at this point the patient does have a prescription for trazodone which I was under the impression he was taking. He states that he is not really sure that he is taking that on a regular basis or not. To be honest is  on his list he tells me that in his pill bottle he has set pills he takes every day and he is really unsure  of exactly what he takes. He needs to go back and have a look at that and try to figure things out. Either way if we were to place him on something like Cipro or Levaquin with him still being on trazodone that can prolong the QT interval which can be also dangerous. I am not willing to do that. The patient really needs to go to the hospital for in my opinion IV antibiotic therapy. He was positive for Pseudomonas and when he came in today his dressing was completely saturated with blue-green drainage consistent with Pseudomonas as well that I saw even before I saw the results of the culture and already knew what we were dealing with. Objective Constitutional Well-nourished and well-hydrated in no acute distress. Vitals Time Taken: 8:35 AM, Height: 69 in, Weight: 295 lbs, BMI: 43.6, Temperature: 98.1 F, Pulse: 99 bpm, Respiratory Rate: 18 breaths/min, Blood Pressure: 134/78 mmHg. Respiratory normal breathing without difficulty. Psychiatric this patient is able to make decisions and demonstrates good insight into disease process. Alert and Oriented x 3. pleasant and cooperative. General Notes: Patient's left lower extremity appears to be macerated and broken down there is no open wounds per se but at the same time he just does not seem to be making any progress with this I think he really needs IV antibiotic therapy to be honest I think IV diuretics would also be of benefit. This has been discussed with the patient actually since Christmas 2020 and have yet been able to get him to go to the hospital for appropriate treatment. Other Condition(s) Patient presents with Lymphedema located on the Bilateral Leg. Assessment Active Problems ICD-10 Lymphedema, not elsewhere classified Venous insufficiency (chronic) (peripheral) Type 2 diabetes mellitus with other skin complications Essential (primary) hypertension Chronic combined systolic (congestive) and diastolic (congestive) heart  failure Cutaneous abscess of left lower limb Plan [name], [name] ([id number]) Wound Cleansing: Clean wound with Normal Saline. - in office Cleanse wound with mild soap and water - left leg Primary Wound Dressing: Other: - ABD Secondary Dressing: ABD and Kerlix/Conform Follow-up Appointments: Return Appointment in 1 week. 1. At this point I think the patient needs to go to the ER ASAP at this moment in order to get this taken care of. With that being said I did advise him of that today I cannot put him on the Levaquin not knowing if he is really taking the trazodone or not he does not really know what he is taking he just has a pill bottle and takes the pills. 2. I am also going to recommend that the patient really likely needs IV diuretics to try to get his fluid under control as well. I am a send a copy of this note with him today. 3. He was already supposed to have gone last week to the ER he did not I think he needs to be in the hospital for IV antibiotic and diuretic therapy for at least a couple of days to get things under control and then if he is not really not taking the trazodone he can be discharged on Levaquin or Cipro to try to complete this. I explained to the patient that he really needs to go to the hospital and if he does not then subsequently I am going to have to see about discharging him from the clinic due to  noncompliance as there is really nothing more that I can do for him at this point. Electronic Signature(s) Signed: 01/31/2020 9:01:49 AM By: Worthy Keeler PA-C Entered By: Worthy Keeler on 01/31/2020 09:01:49 Thane, [name] ([id number]) -------------------------------------------------------------------------------- SuperBill Details Patient Name: [name]. Date of Service: 01/31/2020 Medical Record Number: [id number] Patient Account Number: [id number] Date of Birth/Sex: [birth year] (69 y.o. M) Treating RN: Cornell Barman Primary Care Provider:  Aura Dials Other Clinician: Referring Provider: Aura Dials Treating Provider/Extender: Melburn Hake, Breylen Agyeman Weeks in Treatment: 18 Diagnosis Coding ICD-10 Codes Code Description I89.0 Lymphedema, not elsewhere classified I87.2 Venous insufficiency (chronic) (peripheral) E11.628 Type 2 diabetes mellitus with other skin complications B09 Essential (primary) hypertension I50.42 Chronic combined systolic (congestive) and diastolic (congestive) heart failure L02.416 Cutaneous abscess of left lower limb Facility Procedures CPT4 Code: [id number] Description: 99213 - WOUND CARE VISIT-LEV 3 EST PT Modifier: Quantity: 1 Physician Procedures CPT4 Code: [id number] Description: 99214 - WC PHYS LEVEL 4 - EST PT Modifier: Quantity: 1 CPT4 Code: Description: ICD-10 Diagnosis Description I89.0 Lymphedema, not elsewhere classified I87.2 Venous insufficiency (chronic) (peripheral) E11.628 Type 2 diabetes mellitus with other skin complications K35 Essential (primary) hypertension Modifier: Quantity: Electronic Signature(s) Signed: 01/31/2020 9:02:01 AM By: Worthy Keeler PA-C Entered By: Worthy Keeler on 01/31/2020 09:02:01

## 2020-01-31 NOTE — ED Triage Notes (Signed)
Pt sent by PCP for evaluation of left lower leg infection, goes to wound center in [place] about once a week for wound checks. Sent here for IV antibiotics. Leg is wrapped with gauze, skin around gauze looks red, drainage noted to bandage. Pt a.o, ambulatory.

## 2020-01-31 NOTE — Progress Notes (Signed)
Left lower extremity venous duplex completed. Refer to "CV Proc" under chart review to view preliminary results.  01/31/2020 2:50 PM Kelby Aline., MHA, RVT, RDCS, RDMS

## 2020-01-31 NOTE — ED Notes (Signed)
Pt transported to XR via stretcher at this time.   

## 2020-02-01 DIAGNOSIS — I130 Hypertensive heart and chronic kidney disease with heart failure and stage 1 through stage 4 chronic kidney disease, or unspecified chronic kidney disease: Secondary | ICD-10-CM | POA: Diagnosis not present

## 2020-02-01 DIAGNOSIS — I890 Lymphedema, not elsewhere classified: Secondary | ICD-10-CM

## 2020-02-01 DIAGNOSIS — Z79899 Other long term (current) drug therapy: Secondary | ICD-10-CM

## 2020-02-01 DIAGNOSIS — N182 Chronic kidney disease, stage 2 (mild): Secondary | ICD-10-CM | POA: Diagnosis not present

## 2020-02-01 DIAGNOSIS — E1122 Type 2 diabetes mellitus with diabetic chronic kidney disease: Secondary | ICD-10-CM | POA: Diagnosis not present

## 2020-02-01 DIAGNOSIS — I5033 Acute on chronic diastolic (congestive) heart failure: Secondary | ICD-10-CM | POA: Diagnosis not present

## 2020-02-01 DIAGNOSIS — D649 Anemia, unspecified: Secondary | ICD-10-CM

## 2020-02-01 DIAGNOSIS — I4891 Unspecified atrial fibrillation: Secondary | ICD-10-CM

## 2020-02-01 DIAGNOSIS — L03116 Cellulitis of left lower limb: Secondary | ICD-10-CM

## 2020-02-01 LAB — CBC
HCT: 37.9 % — ABNORMAL LOW (ref 39.0–52.0)
Hemoglobin: 12 g/dL — ABNORMAL LOW (ref 13.0–17.0)
MCH: 29.1 pg (ref 26.0–34.0)
MCHC: 31.7 g/dL (ref 30.0–36.0)
MCV: 91.8 fL (ref 80.0–100.0)
Platelets: 258 10*3/uL (ref 150–400)
RBC: 4.13 MIL/uL — ABNORMAL LOW (ref 4.22–5.81)
RDW: 13.7 % (ref 11.5–15.5)
WBC: 11.1 10*3/uL — ABNORMAL HIGH (ref 4.0–10.5)
nRBC: 0 % (ref 0.0–0.2)

## 2020-02-01 LAB — GLUCOSE, CAPILLARY
Glucose-Capillary: 123 mg/dL — ABNORMAL HIGH (ref 70–99)
Glucose-Capillary: 177 mg/dL — ABNORMAL HIGH (ref 70–99)
Glucose-Capillary: 125 mg/dL — ABNORMAL HIGH (ref 70–99)
Glucose-Capillary: 123 mg/dL — ABNORMAL HIGH (ref 70–99)

## 2020-02-01 LAB — BASIC METABOLIC PANEL
Anion gap: 12 (ref 5–15)
BUN: 11 mg/dL (ref 8–23)
CO2: 23 mmol/L (ref 22–32)
Calcium: 8 mg/dL — ABNORMAL LOW (ref 8.9–10.3)
Chloride: 106 mmol/L (ref 98–111)
Creatinine, Ser: 1.12 mg/dL (ref 0.61–1.24)
GFR calc Af Amer: >60 mL/min (ref >60)
GFR calc non Af Amer: >60 mL/min (ref >60)
Glucose, Bld: 148 mg/dL — ABNORMAL HIGH (ref 70–99)
Potassium: 3.7 mmol/L (ref 3.5–5.1)
Sodium: 141 mmol/L (ref 135–145)

## 2020-02-01 LAB — HEPARIN LEVEL (UNFRACTIONATED)
Heparin Unfractionated: 0.25 IU/mL — ABNORMAL LOW (ref 0.30–0.70)
Heparin Unfractionated: 0.34 IU/mL (ref 0.30–0.70)

## 2020-02-01 LAB — MAGNESIUM: Magnesium: 1.7 mg/dL (ref 1.7–2.4)

## 2020-02-01 MED ORDER — FUROSEMIDE 10 MG/ML IJ SOLN
100.0000 mg | Freq: Once | INTRAVENOUS | Status: AC
  Administered 2020-02-01: 100 mg via INTRAVENOUS
  Filled 2020-02-01: qty 10

## 2020-02-01 MED ORDER — SODIUM CHLORIDE 0.9 % IV SOLN
510.0000 mg | Freq: Once | INTRAVENOUS | Status: AC
  Administered 2020-02-01: 510 mg via INTRAVENOUS
  Filled 2020-02-01: qty 17

## 2020-02-01 MED ORDER — POTASSIUM CHLORIDE CRYS ER 20 MEQ PO TBCR
40.0000 meq | EXTENDED_RELEASE_TABLET | Freq: Once | ORAL | Status: AC
  Administered 2020-02-01: 40 meq via ORAL
  Filled 2020-02-01: qty 2

## 2020-02-01 MED ORDER — METOPROLOL TARTRATE 50 MG PO TABS
50.0000 mg | ORAL_TABLET | Freq: Two times a day (BID) | ORAL | Status: DC
  Administered 2020-02-01 – 2020-02-06 (×10): 50 mg via ORAL
  Filled 2020-02-01 (×10): qty 1

## 2020-02-01 MED ORDER — APIXABAN 5 MG PO TABS
5.0000 mg | ORAL_TABLET | Freq: Two times a day (BID) | ORAL | Status: DC
  Administered 2020-02-01 – 2020-02-06 (×11): 5 mg via ORAL
  Filled 2020-02-01 (×11): qty 1

## 2020-02-01 NOTE — Progress Notes (Signed)
[place] for heparin Indication: atrial fibrillation  Allergies  Allergen Reactions  . Codeine Swelling    Water retention  . Sulfur Other (See Comments)    Not sure of reaction a young child    Patient Measurements: Height: 5\' 9"  (175.3 cm) Weight: 296 lb 4.8 oz (134.4 kg) IBW/kg (Calculated) : 70.7 Heparin Dosing Weight: 102.7 kg  Vital Signs: Temp: 98.6 F (37 C) (03/19 0747) Temp Source: Oral (03/19 0747) BP: 112/60 (03/19 0747) Pulse Rate: 87 (03/19 0747)  Labs: Recent Labs    01/31/20 1158 01/31/20 1608 01/31/20 1904 01/31/20 2115 02/01/20 0140 02/01/20 0145 02/01/20 0927  HGB 12.5*  --   --   --  12.0*  --   --   HCT 40.2  --   --   --  37.9*  --   --   PLT 246  --   --   --  258  --   --   LABPROT 14.1  --   --   --   --   --   --   INR 1.1  --   --   --   --   --   --   HEPARINUNFRC  --   --   --   --  0.25*  --  0.34  CREATININE 1.32*  --   --   --   --  1.12  --   TROPONINIHS  --  45* 47* 52*  --   --   --     Estimated Creatinine Clearance: 83.5 mL/min (by C-G formula based on SCr of 1.12 mg/dL).   Medical History: Past Medical History:  Diagnosis Date  . Arthritis   . Back pain    occasionally  . BPH (benign prostatic hypertrophy)    takes Uroxatral daily as well as Finasteride   . Diabetes mellitus without complication ([place])    takes Metformin daily  . Diabetic foot infection ([place]) 10/22/2019  . GERD (gastroesophageal reflux disease)    no meds  . Hepatitis hx of   at age 67  . Hypertension    takes Losartan and HCTZ daily  . Joint pain   . Lymphedema 10/22/2019  . Urinary frequency   . Urinary urgency   . Venous stasis dermatitis 11/19/2019    Medications:  Scheduled:  . finasteride  5 mg Oral Daily  . gabapentin  800 mg Oral QHS  . insulin aspart  0-20 Units Subcutaneous TID WC  . insulin glargine  10 Units Subcutaneous QHS  . metoprolol tartrate  25 mg Oral BID  . torsemide  40 mg Oral  Daily   Infusions:  . sodium chloride 250 mL (01/31/20 2233)  .  ceFAZolin (ANCEF) IV 1 g (02/01/20 0941)  . heparin 1,700 Units/hr (02/01/20 0933)    Assessment: [name] is a 69 y/o M presenting with new onset Afib. CHADSVASC score at least 3 due to age, diabetes, and CHF. He was not on any anticoagulation PTA.  Heparin level therapeutic s/p rate increase to 1700 units/hr  Goal of Therapy:  Heparin level 0.3-0.7 units/ml Monitor platelets by anticoagulation protocol: Yes   Plan:  Continue heparin gtt at 1700 units/hr F/u 6 hour heparin level to confirm  Bertis Ruddy, PharmD Clinical Pharmacist Please check AMION for all [facility] numbers 02/01/2020 10:35 AM

## 2020-02-01 NOTE — Progress Notes (Signed)
Patient refused CPAP expressed he does not wear one at home.

## 2020-02-01 NOTE — Plan of Care (Signed)

## 2020-02-01 NOTE — TOC Benefit Eligibility Note (Signed)
Transition of Care [facility]) Benefit Eligibility Note    Patient Details  Name: [name]. MRN: [id number] Date of Birth: 06-May-1951   Medication/Dose: Xarelto 15mg  and 20mg   Prescription Coverage Preferred Pharmacy: [facility] for 30 day and [employer] Order for 90 day  Spoke with Person/Company/Phone Number:: [facility]  Co-Pay: $131 for 90 day mail order / $47 for 30 day retail   [employer] Phone Number: 02/01/2020, 3:48 PM

## 2020-02-01 NOTE — Progress Notes (Addendum)
Subjective: HD#2 Events Overnight: No events overnight  Patient was seen this morning on rounds.  Patient was working with physical therapy seems to be ambulating well.  Patient is to feeling well and denies any new symptoms at this time.  Patient states that he continues to have significant draining from his left lower extremity.  He states that this has been chronic for him.  Otherwise he denies any chest pain, shortness of breath, fevers, abdominal pain, worsening lower extremity pain.  Objective:  Vital signs in last 24 hours: Vitals:   01/31/20 1826 02/01/20 0130 02/01/20 0300 02/01/20 0747  BP:  139/73 (!) 124/57 112/60  Pulse: 96 92 99 87  Resp:  19 (!) 22 (!) 21  Temp:  98.1 F (36.7 C) 98.1 F (36.7 C) 98.6 F (37 C)  TempSrc:  Oral Oral Oral  SpO2:  95% 94% 93%  Weight:   134.4 kg   Height:       Supplemental O2: RA  Physical Exam: Physical Exam  Constitutional: He is oriented to person, place, and time and well-developed, well-nourished, and in no distress.  HENT:  Head: Normocephalic and atraumatic.  Eyes: EOM are normal.  Cardiovascular: Normal rate and intact distal pulses. Exam reveals no gallop and no friction rub.  No murmur heard. Pulmonary/Chest: Effort normal and breath sounds normal. No respiratory distress. He exhibits no tenderness.  Abdominal: Soft. Bowel sounds are normal. He exhibits no distension. There is no abdominal tenderness.  Musculoskeletal:        General: Edema (left lower extremity edema with well demarcated erythema, warmth) present. Normal range of motion.     Cervical back: Normal range of motion.  Neurological: He is alert and oriented to person, place, and time.  Skin: Skin is warm and dry.    Filed Weights   01/31/20 1149 02/01/20 0300  Weight: 136.1 kg 134.4 kg     Intake/Output Summary (Last 24 hours) at 02/01/2020 0930 Last data filed at 02/01/2020 0913 Gross per 24 hour  Intake 2847.46 ml  Output 250 ml  Net 2597.46  ml    Risk Score:  CHA2DS2-VASc: 4 with a 4.8%-6.7% risk  HAS_BLED Score: 3 points with a 5.8% risk of bleeding  Pertinent labs/Imaging: CBC Latest Ref Rng & Units 02/01/2020 01/31/2020 10/22/2019  WBC 4.0 - 10.5 K/uL 11.1(H) 9.9 7.1  Hemoglobin 13.0 - 17.0 g/dL 12.0(L) 12.5(L) 13.1(L)  Hematocrit 39.0 - 52.0 % 37.9(L) 40.2 39.7  Platelets 150 - 400 K/uL 258 246 203    CMP Latest Ref Rng & Units 02/01/2020 01/31/2020 10/22/2019  Glucose 70 - 99 mg/dL 148(H) 298(H) 145(H)  BUN 8 - 23 mg/dL 11 11 28(H)  Creatinine 0.61 - 1.24 mg/dL 1.12 1.32(H) 1.39(H)  Sodium 135 - 145 mmol/L 141 139 142  Potassium 3.5 - 5.1 mmol/L 3.7 3.8 4.2  Chloride 98 - 111 mmol/L 106 102 106  CO2 22 - 32 mmol/L 23 24 28   Calcium 8.9 - 10.3 mg/dL 8.0(L) 8.2(L) 7.9(L)  Total Protein 6.5 - 8.1 g/dL - 7.0 6.8  Total Bilirubin 0.3 - 1.2 mg/dL - 1.0 0.5  Alkaline Phos 38 - 126 U/L - 75 -  AST 15 - 41 U/L - 25 26  ALT 0 - 44 U/L - 18 27   Troponin: 45> 47 BNP: 312.2  Hemoglobin A1c: 7.3 TSH: 2.591 Blood cultures: Pending  Iron/TIBC/Ferritin/ %Sat    Component Value Date/Time   IRON 44 (L) 01/31/2020 1904   TIBC 385 01/31/2020  1904   FERRITIN 46 01/31/2020 1904   IRONPCTSAT 11 (L) 01/31/2020 1904   B12: 205 Folate: 5.3 Reticulocyte count pct: 1.9% Abs reticulocyte count: 76.4 Reticulocyte index: 1.71 (hypoproliferative) Ion deficit: 646 mh  Echocardiogram: 1. Very difficult study with poor windows. EF 40-45% but difficult to assess due to frequent PVCs. Would recommend to re-check echo after PVCs treated/resolve.   2. Left ventricular ejection fraction, by estimation, is 40 to 45%. The left ventricle has mildly decreased function. The left ventricle demonstrates global hypokinesis. There is moderate concentric left ventricular hypertrophy. Left ventricular diastolic function could not be evaluated. Left ventricular diastolic function could not be evaluated.  3. Right ventricular systolic function is  normal. The right ventricular size is normal.   4. Left atrial size was mild to moderately dilated.   5. The mitral valve is grossly normal. Trivial mitral valve regurgitation. No evidence of mitral stenosis.   6. The aortic valve is tricuspid. Aortic valve regurgitation is not visualized. No aortic stenosis is present.   7. The inferior vena cava is dilated in size with >50% respiratory variability, suggesting right atrial pressure of 8 mmHg. Comparison(s): A prior study was performed on 08/15/2019. Changes from prior study are noted. LVEF now 40-45% but frequent PVCs make accurate assessment difficult.  Assessment/Plan:  Principal Problem:   Non- purulent Cellulitis of left leg w/ erysipelas Active Problems:   Type 2 diabetes mellitus (HCC)   Acute on chronic heart failure with preserved ejection fraction (HFpEF) (HCC)   Lymphedema   Paroxysmal atrial fibrillation (HCC)    Patient Summary: [name]. is a 69 y.o. with pertinent PMH of type 2 diabetes, hypertension, diastolic heart failure, chronic lymphedema/venous stasis   who presented with  lower extremity pain, swelling, and redness and admit left lower extremity lymphedema complicated by sign of hypervolemia  on hospital day   #Acute on chronic heart failure with mildly reduced ejection fraction: Patient continues to have pretty significant left lower extremity edema.  He does admit to having persistent weeping of the leg.  He has signs of hypervolemia on exam with his most recent echo showing reduced ejection fraction of 40-45% I will discontinue IV antibiotics at this time.  Patient's redness warmth likely secondary to chronic inflammation in the setting of chronic lymphedema. - We will continue to wrap the foot/leg - We will start to give IV furosemide 100 mg and monitor his response. - Strict ins and outs - Daily weights  #Failure with mildly reduced ejection fraction Patient's echo shows mildly reduced ejection  fraction of 40-45%.  We will continue his metoprolol.  He will likely need to start low-dose ACE inhibitor in the outpatient setting for goal-directed therapy for HFmrEF, particularly in the setting of longstanding type 2 diabetes with chronic kidney disease.   #Atrial fibrillation: Echocardiogram demonstrates a new depressed ejection fraction of 40 to 45%. His telemetry shows significant PVC burden without overt symptoms today on exam.  I will continue metoprolol today.  I will discontinue his heparin today and will likely discharge him with close follow-up with the atrial fibrillation clinic/electrophysiology.patient will likely need further evaluation management for anticoagulation needs moving forward as his chest vest shows elevated risk of stroke.  Alternatively he does have an elevated risk of bleeding and will need a further conversation on the risks and benefits of anticoagulation moving forward. -Continue metoprolol twice daily -Set patient up with electrophysiology in the outpatient setting.  #Diabetes mellitus, type II His recent hemoglobin  A1c is 7.3. He does have chronic kidney disease likely secondary to his diabetes.  He will need close follow-up with a primary care doctor in order to continue to control his diabetes. -Continue sliding scale insulin  #Normocytic anemia: Patient's most recent iron panel shows low ferritin and low iron with elevated TIBC indicating likely iron deficiency anemia.  Considering his recent echo showin mildly reduced ejection fraction, the patient would fit from IV iron during his hospital stay.  I will give him 510 mg Feraheme IV today. -Feraheme 510 mg IV   #HTN: -We will hold antihypertensive medications at this time due to his normotensive pressures.   #CKD stage II: Patient's creatinine has improved since yesterday down to 1.12 with a GFR of greater than 60.  Diet: Heart Healthy IVF: None,None VTE: Heparin Code: Full PT/OT recs: Pending TOC  recs: pending   Dispo: Anticipated discharge today or tomorrow.    Marianna Payment, D.O. [facility], PGY-1 Date 02/01/2020 Time 9:30 AM

## 2020-02-01 NOTE — Progress Notes (Signed)
ANTICOAGULATION CONSULT NOTE - Follow Up Consult  Pharmacy Consult for heparin Indication: atrial fibrillation  Labs: Recent Labs    01/31/20 1158 01/31/20 1608 01/31/20 1904 01/31/20 2115 02/01/20 0140 02/01/20 0145  HGB 12.5*  --   --   --  12.0*  --   HCT 40.2  --   --   --  37.9*  --   PLT 246  --   --   --  258  --   LABPROT 14.1  --   --   --   --   --   INR 1.1  --   --   --   --   --   HEPARINUNFRC  --   --   --   --  0.25*  --   CREATININE 1.32*  --   --   --   --  1.12  TROPONINIHS  --  45* 47* 52*  --   --     Assessment: 68yo male subtherapeutic on heparin with initial dosing for Afib; no gtt issues or signs of bleeding per RN.  Goal of Therapy:  Heparin level 0.3-0.7 units/ml   Plan:  Will increase heparin gtt by 1-2 units/kg/hr to 1700 units/hr and check level in 6 hours.    Wynona Neat, PharmD, BCPS  02/01/2020,3:34 AM

## 2020-02-01 NOTE — Progress Notes (Signed)
Orthopedic Tech Progress Note Patient Details:  [name]. 1951/01/31 [id number]  Ortho Devices Type of Ortho Device: Louretta Parma boot Ortho Device/Splint Location: bi-lateral Ortho Device/Splint Interventions: Ordered, Application, Adjustment   Post Interventions Patient Tolerated: Well Instructions Provided: Care of device, Adjustment of device   Karolee Stamps 02/01/2020, 9:31 PM

## 2020-02-01 NOTE — Evaluation (Signed)
Physical Therapy Evaluation Patient Details Name: [name]. MRN: [id number] DOB: January 15, 1951 Today's Date: 02/01/2020   History of Present Illness  Patient is a 69 year old male admitted with red, swollen, weeping L LE. PMH includes: DM, HTN, HF, Afib  Clinical Impression  Patient received sitting up on side of bed, RN present initially. Patient has weeping L LE, reports no pain in left LE. Patient is independent with sit to stand and ambulated 150 feet without ad, pushing his IV pole. Patient demonstrates good ability with mobility and appears to be at baseline level of functioning. Patient is safe to ambulate in halls with nursing/family assist.      Follow Up Recommendations No PT follow up    Equipment Recommendations  None recommended by PT    Recommendations for Other Services       Precautions / Restrictions Precautions Precautions: Fall Precaution Comments: mod fall Restrictions Weight Bearing Restrictions: No      Mobility  Bed Mobility               General bed mobility comments: patient received sitting on side of bed and left on side of bed  Transfers Overall transfer level: Independent   Transfers: Sit to/from Stand Sit to Stand: Independent            Ambulation/Gait Ambulation/Gait assistance: Supervision Gait Distance (Feet): 150 Feet Assistive device: None Gait Pattern/deviations: WFL(Within Functional Limits) Gait velocity: WNL   General Gait Details: slight SOB with ambulation, fatigue reported, No pain in LEs with gait.  Stairs            Wheelchair Mobility    Modified Rankin (Stroke Patients Only)       Balance Overall balance assessment: Mild deficits observed, not formally tested                                           Pertinent Vitals/Pain Pain Assessment: No/denies pain    Home Living Family/patient expects to be discharged to:: Private residence Living Arrangements:  Spouse/significant other Available Help at Discharge: Family;Available 24 hours/day Type of Home: House Home Access: Level entry     Home Layout: Able to live on main level with bedroom/bathroom;Two level Home Equipment: Cane - single point;Bedside commode;Walker - 2 wheels      Prior Function Level of Independence: Independent         Comments: does not use AD at baseline     Hand Dominance   Dominant Hand: Right    Extremity/Trunk Assessment   Upper Extremity Assessment Upper Extremity Assessment: Overall WFL for tasks assessed    Lower Extremity Assessment Lower Extremity Assessment: Overall WFL for tasks assessed    Cervical / Trunk Assessment Cervical / Trunk Assessment: Normal  Communication   Communication: No difficulties  Cognition Arousal/Alertness: Awake/alert Behavior During Therapy: WFL for tasks assessed/performed Overall Cognitive Status: Within Functional Limits for tasks assessed                                        General Comments      Exercises     Assessment/Plan    PT Assessment Patent does not need any further PT services  PT Problem List         PT Treatment Interventions  PT Goals (Current goals can be found in the Care Plan section)  Acute Rehab PT Goals Patient Stated Goal: to return home PT Goal Formulation: With patient Time For Goal Achievement: 02/08/20 Potential to Achieve Goals: Good    Frequency     Barriers to discharge        Co-evaluation               AM-PAC PT "6 Clicks" Mobility  Outcome Measure Help needed turning from your back to your side while in a flat bed without using bedrails?: None Help needed moving from lying on your back to sitting on the side of a flat bed without using bedrails?: A Little Help needed moving to and from a bed to a chair (including a wheelchair)?: None Help needed standing up from a chair using your arms (e.g., wheelchair or bedside chair)?:  None Help needed to walk in hospital room?: None Help needed climbing 3-5 steps with a railing? : None 6 Click Score: 23    End of Session   Activity Tolerance: Patient tolerated treatment well Patient left: in bed;with call bell/phone within reach Nurse Communication: Mobility status PT Visit Diagnosis: Difficulty in walking, not elsewhere classified (R26.2)    Time: 8208-1388 PT Time Calculation (min) (ACUTE ONLY): 35 min   Charges:   PT Evaluation $PT Eval Moderate Complexity: 1 Mod PT Treatments $Gait Training: 8-22 mins        Zahira Brummond, PT, GCS 02/01/20,10:29 AM

## 2020-02-02 DIAGNOSIS — L089 Local infection of the skin and subcutaneous tissue, unspecified: Secondary | ICD-10-CM | POA: Diagnosis present

## 2020-02-02 DIAGNOSIS — R3915 Urgency of urination: Secondary | ICD-10-CM | POA: Diagnosis present

## 2020-02-02 DIAGNOSIS — E1122 Type 2 diabetes mellitus with diabetic chronic kidney disease: Secondary | ICD-10-CM | POA: Diagnosis present

## 2020-02-02 DIAGNOSIS — Z20822 Contact with and (suspected) exposure to covid-19: Secondary | ICD-10-CM | POA: Diagnosis present

## 2020-02-02 DIAGNOSIS — N400 Enlarged prostate without lower urinary tract symptoms: Secondary | ICD-10-CM | POA: Diagnosis present

## 2020-02-02 DIAGNOSIS — Z6841 Body Mass Index (BMI) 40.0 and over, adult: Secondary | ICD-10-CM | POA: Diagnosis not present

## 2020-02-02 DIAGNOSIS — I890 Lymphedema, not elsewhere classified: Secondary | ICD-10-CM | POA: Diagnosis present

## 2020-02-02 DIAGNOSIS — Z842 Family history of other diseases of the genitourinary system: Secondary | ICD-10-CM | POA: Diagnosis not present

## 2020-02-02 DIAGNOSIS — I493 Ventricular premature depolarization: Secondary | ICD-10-CM | POA: Diagnosis not present

## 2020-02-02 DIAGNOSIS — I5043 Acute on chronic combined systolic (congestive) and diastolic (congestive) heart failure: Secondary | ICD-10-CM | POA: Diagnosis present

## 2020-02-02 DIAGNOSIS — I447 Left bundle-branch block, unspecified: Secondary | ICD-10-CM | POA: Diagnosis present

## 2020-02-02 DIAGNOSIS — I110 Hypertensive heart disease with heart failure: Secondary | ICD-10-CM | POA: Diagnosis not present

## 2020-02-02 DIAGNOSIS — I509 Heart failure, unspecified: Secondary | ICD-10-CM

## 2020-02-02 DIAGNOSIS — E11628 Type 2 diabetes mellitus with other skin complications: Secondary | ICD-10-CM | POA: Diagnosis present

## 2020-02-02 DIAGNOSIS — Z885 Allergy status to narcotic agent status: Secondary | ICD-10-CM | POA: Diagnosis not present

## 2020-02-02 DIAGNOSIS — D649 Anemia, unspecified: Secondary | ICD-10-CM | POA: Diagnosis present

## 2020-02-02 DIAGNOSIS — E1151 Type 2 diabetes mellitus with diabetic peripheral angiopathy without gangrene: Secondary | ICD-10-CM | POA: Diagnosis present

## 2020-02-02 DIAGNOSIS — I5033 Acute on chronic diastolic (congestive) heart failure: Secondary | ICD-10-CM | POA: Diagnosis not present

## 2020-02-02 DIAGNOSIS — Z79899 Other long term (current) drug therapy: Secondary | ICD-10-CM | POA: Diagnosis not present

## 2020-02-02 DIAGNOSIS — Z7984 Long term (current) use of oral hypoglycemic drugs: Secondary | ICD-10-CM | POA: Diagnosis not present

## 2020-02-02 DIAGNOSIS — I130 Hypertensive heart and chronic kidney disease with heart failure and stage 1 through stage 4 chronic kidney disease, or unspecified chronic kidney disease: Secondary | ICD-10-CM | POA: Diagnosis present

## 2020-02-02 DIAGNOSIS — K219 Gastro-esophageal reflux disease without esophagitis: Secondary | ICD-10-CM | POA: Diagnosis present

## 2020-02-02 DIAGNOSIS — I4891 Unspecified atrial fibrillation: Secondary | ICD-10-CM | POA: Diagnosis not present

## 2020-02-02 DIAGNOSIS — Z882 Allergy status to sulfonamides status: Secondary | ICD-10-CM | POA: Diagnosis not present

## 2020-02-02 DIAGNOSIS — N182 Chronic kidney disease, stage 2 (mild): Secondary | ICD-10-CM | POA: Diagnosis not present

## 2020-02-02 DIAGNOSIS — L03116 Cellulitis of left lower limb: Secondary | ICD-10-CM | POA: Diagnosis present

## 2020-02-02 DIAGNOSIS — I480 Paroxysmal atrial fibrillation: Secondary | ICD-10-CM | POA: Diagnosis present

## 2020-02-02 DIAGNOSIS — A46 Erysipelas: Secondary | ICD-10-CM | POA: Diagnosis present

## 2020-02-02 DIAGNOSIS — M1990 Unspecified osteoarthritis, unspecified site: Secondary | ICD-10-CM | POA: Diagnosis present

## 2020-02-02 DIAGNOSIS — I472 Ventricular tachycardia: Secondary | ICD-10-CM | POA: Diagnosis present

## 2020-02-02 DIAGNOSIS — R600 Localized edema: Secondary | ICD-10-CM | POA: Diagnosis not present

## 2020-02-02 DIAGNOSIS — I5023 Acute on chronic systolic (congestive) heart failure: Secondary | ICD-10-CM | POA: Diagnosis not present

## 2020-02-02 LAB — CBC
HCT: 36.9 % — ABNORMAL LOW (ref 39.0–52.0)
Hemoglobin: 11.8 g/dL — ABNORMAL LOW (ref 13.0–17.0)
MCH: 29.5 pg (ref 26.0–34.0)
MCHC: 32 g/dL (ref 30.0–36.0)
MCV: 92.3 fL (ref 80.0–100.0)
Platelets: 263 10*3/uL (ref 150–400)
RBC: 4 MIL/uL — ABNORMAL LOW (ref 4.22–5.81)
RDW: 13.7 % (ref 11.5–15.5)
WBC: 9.7 10*3/uL (ref 4.0–10.5)
nRBC: 0 % (ref 0.0–0.2)

## 2020-02-02 LAB — BASIC METABOLIC PANEL
Anion gap: 13 (ref 5–15)
BUN: 18 mg/dL (ref 8–23)
CO2: 26 mmol/L (ref 22–32)
Calcium: 8 mg/dL — ABNORMAL LOW (ref 8.9–10.3)
Chloride: 100 mmol/L (ref 98–111)
Creatinine, Ser: 1.34 mg/dL — ABNORMAL HIGH (ref 0.61–1.24)
GFR calc Af Amer: >60 mL/min (ref >60)
GFR calc non Af Amer: 53 mL/min — ABNORMAL LOW (ref >60)
Glucose, Bld: 120 mg/dL — ABNORMAL HIGH (ref 70–99)
Potassium: 3.4 mmol/L — ABNORMAL LOW (ref 3.5–5.1)
Sodium: 139 mmol/L (ref 135–145)

## 2020-02-02 LAB — GLUCOSE, CAPILLARY
Glucose-Capillary: 107 mg/dL — ABNORMAL HIGH (ref 70–99)
Glucose-Capillary: 154 mg/dL — ABNORMAL HIGH (ref 70–99)
Glucose-Capillary: 124 mg/dL — ABNORMAL HIGH (ref 70–99)
Glucose-Capillary: 182 mg/dL — ABNORMAL HIGH (ref 70–99)

## 2020-02-02 LAB — PHOSPHORUS: Phosphorus: 4.7 mg/dL — ABNORMAL HIGH (ref 2.5–4.6)

## 2020-02-02 LAB — MAGNESIUM: Magnesium: 1.7 mg/dL (ref 1.7–2.4)

## 2020-02-02 MED ORDER — GABAPENTIN 400 MG PO CAPS
400.0000 mg | ORAL_CAPSULE | Freq: Every evening | ORAL | Status: DC | PRN
  Administered 2020-02-02: 400 mg via ORAL

## 2020-02-02 MED ORDER — FUROSEMIDE 10 MG/ML IJ SOLN
80.0000 mg | Freq: Two times a day (BID) | INTRAMUSCULAR | Status: DC
  Administered 2020-02-02 (×2): 80 mg via INTRAVENOUS
  Filled 2020-02-02 (×2): qty 8

## 2020-02-02 MED ORDER — POTASSIUM CHLORIDE CRYS ER 20 MEQ PO TBCR
30.0000 meq | EXTENDED_RELEASE_TABLET | Freq: Four times a day (QID) | ORAL | Status: AC
  Administered 2020-02-02 (×2): 30 meq via ORAL
  Filled 2020-02-02 (×2): qty 1

## 2020-02-02 NOTE — Progress Notes (Signed)
Patient refuses to wear CPAP while in hospital. 

## 2020-02-02 NOTE — Plan of Care (Signed)

## 2020-02-02 NOTE — Progress Notes (Signed)
Patient voided only 360 cc in the past 12 hrs and Lasix 80 mg was given on time BID. Bladder scanner done shows 62 cc. Paged resident on call and made her aware.

## 2020-02-02 NOTE — Progress Notes (Signed)
   Subjective: HD#0   Overnight: No acute events reported, Unna boots placed  Today, [employer]. [name]. He states that he is feeling well with exception of lower extremity edema. He does not recall his dry weight but states that when he Keith initially started on torsemide, he Keith having increased urine output and lost almost 17 pounds. He denies chest pain, shortness of breath.   Objective:  Vital signs in last 24 hours: Vitals:   02/01/20 2002 02/01/20 2003 02/02/20 0557 02/02/20 0606  BP:   115/68   Pulse: 79 79 71   Resp: 15 (!) 25 20   Temp: 98.1 F (36.7 C)  (!) 97.2 F (36.2 C)   TempSrc: Oral  Oral   SpO2: 97% 98% 93%   Weight:    132 kg  Height:       Const: In no apparent distres Resp: CTA BL, no wheezes, crackles, rhonchi CV: RRR, no murmurs, gallop, rub Abd: Bowel sounds present, distended due to body habitus, nontender to palpation Ext: In Unna boots   Assessment/Plan:  Principal Problem:   Acute on chronic heart failure with preserved ejection fraction (HFpEF) (HCC) Active Problems:   Type 2 diabetes mellitus ([place])   Lymphedema   Paroxysmal atrial fibrillation (HCC)  [name] Pilgrim [name]. is a 69 y.o. with pertinent PMH of type 2 diabetes, hypertension, diastolic heart failure, chronic lymphedema/venous stasis who presented with lower extremity pain, swelling, and redness and admit left lower extremity lymphedema complicated by sign of hypervolemia on hospital day    #Acute on chronic heart failure with mildly reduced ejection fraction: His weight continues to trend down and overall is 9 pounds down since admission.  Urine output of 2.1 L with overall net -945 cc after 100 mg IV Lasix.  His renal function has improved.  He is status post Unna boots placement of both legs. -Continue diuresis with IV Lasix 80 mg twice daily -Trend BMP -F/u Mg, Phos -Strict I&Os -Daily Weights -Fluid restriction -Keep O2 sat  >88 -Replenish K as needed >4.0 -Continue metoprolol -At discharge, consider low-dose ACE inhibitor or ARB. Will also benefit from Metolazone as well   #Atrial fibrillation: #PVCs Cardiac monitoring revealed PVCs at 1900 and 5 beat of VT at 1900 -Continue metoprolol -Continue Eliquis -Set patient up with electrophysiology in the outpatient setting. -Follow up magnesium, phos   #Diabetes mellitus, type II -Continue Lantus 10 mg nightly -Continue SSI   #Normocytic anemia w/ component of iron deficiency Status post IV Feraheme   #HTN: -Continue to hold antihypertensive medications at this time due to his normotensive pressures.   #CKD stage II: Patient's creatinine has improved since yesterday down to 1.12 with a GFR of greater than 60.  Diet: Heart Healthy IVF: None,None VTE: Heparin Code: Full  Prior to Admission Living Arrangement: Home Anticipated Discharge Location: Home Barriers to Discharge: Ongoing medical management Dispo: Anticipated discharge in approximately 1-2 day(s).    [name], MD 02/02/2020, 6:36 AM Pager: [phone] Internal Medicine Teaching Service

## 2020-02-02 NOTE — Progress Notes (Signed)
  Date: 02/02/2020  Patient name: [name].  Medical record number: [id number]  Date of birth: 03/19/1951   This patient's plan of care was discussed with the house staff. Please see Dr. Nelia Shi note for complete details. I concur with his findings.   Sid Falcon, MD 02/02/2020, 9:30 PM

## 2020-02-03 DIAGNOSIS — Z794 Long term (current) use of insulin: Secondary | ICD-10-CM

## 2020-02-03 DIAGNOSIS — E1151 Type 2 diabetes mellitus with diabetic peripheral angiopathy without gangrene: Secondary | ICD-10-CM

## 2020-02-03 DIAGNOSIS — I5023 Acute on chronic systolic (congestive) heart failure: Secondary | ICD-10-CM

## 2020-02-03 LAB — BASIC METABOLIC PANEL
Anion gap: 14 (ref 5–15)
BUN: 24 mg/dL — ABNORMAL HIGH (ref 8–23)
CO2: 28 mmol/L (ref 22–32)
Calcium: 8.1 mg/dL — ABNORMAL LOW (ref 8.9–10.3)
Chloride: 99 mmol/L (ref 98–111)
Creatinine, Ser: 1.43 mg/dL — ABNORMAL HIGH (ref 0.61–1.24)
GFR calc Af Amer: 57 mL/min — ABNORMAL LOW (ref >60)
GFR calc non Af Amer: 49 mL/min — ABNORMAL LOW (ref >60)
Glucose, Bld: 177 mg/dL — ABNORMAL HIGH (ref 70–99)
Potassium: 3.5 mmol/L (ref 3.5–5.1)
Sodium: 141 mmol/L (ref 135–145)

## 2020-02-03 LAB — GLUCOSE, CAPILLARY
Glucose-Capillary: 139 mg/dL — ABNORMAL HIGH (ref 70–99)
Glucose-Capillary: 166 mg/dL — ABNORMAL HIGH (ref 70–99)
Glucose-Capillary: 118 mg/dL — ABNORMAL HIGH (ref 70–99)
Glucose-Capillary: 154 mg/dL — ABNORMAL HIGH (ref 70–99)

## 2020-02-03 MED ORDER — MAGNESIUM SULFATE 2 GM/50ML IV SOLN
2.0000 g | Freq: Once | INTRAVENOUS | Status: AC
  Administered 2020-02-03: 2 g via INTRAVENOUS
  Filled 2020-02-03: qty 50

## 2020-02-03 MED ORDER — POTASSIUM CHLORIDE CRYS ER 20 MEQ PO TBCR
40.0000 meq | EXTENDED_RELEASE_TABLET | Freq: Two times a day (BID) | ORAL | Status: AC
  Administered 2020-02-03 (×2): 40 meq via ORAL
  Filled 2020-02-03 (×2): qty 2

## 2020-02-03 MED ORDER — TORSEMIDE 20 MG PO TABS
40.0000 mg | ORAL_TABLET | Freq: Every day | ORAL | Status: DC
  Administered 2020-02-03: 40 mg via ORAL
  Filled 2020-02-03: qty 2

## 2020-02-03 NOTE — Procedures (Signed)
Patient declines CPAP at this time. 
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
## 2020-02-03 NOTE — Discharge Instructions (Signed)

## 2020-02-03 NOTE — Progress Notes (Signed)
  Date: 02/03/2020  Patient name: [name].  Medical record number: [id number]  Date of birth: Aug 22, 1951        I have seen and evaluated this patient and I have discussed the plan of care with the house staff. Please see Dr. Nelia Shi note for complete details. I concur with his findings and plan.     Sid Falcon, MD 02/03/2020, 12:07 PM

## 2020-02-04 DIAGNOSIS — E119 Type 2 diabetes mellitus without complications: Secondary | ICD-10-CM

## 2020-02-04 DIAGNOSIS — I110 Hypertensive heart disease with heart failure: Secondary | ICD-10-CM

## 2020-02-04 DIAGNOSIS — I493 Ventricular premature depolarization: Secondary | ICD-10-CM

## 2020-02-04 DIAGNOSIS — M7989 Other specified soft tissue disorders: Secondary | ICD-10-CM

## 2020-02-04 LAB — BASIC METABOLIC PANEL
Anion gap: 11 (ref 5–15)
BUN: 28 mg/dL — ABNORMAL HIGH (ref 8–23)
CO2: 26 mmol/L (ref 22–32)
Calcium: 7.9 mg/dL — ABNORMAL LOW (ref 8.9–10.3)
Chloride: 103 mmol/L (ref 98–111)
Creatinine, Ser: 1.51 mg/dL — ABNORMAL HIGH (ref 0.61–1.24)
GFR calc Af Amer: 53 mL/min — ABNORMAL LOW (ref >60)
GFR calc non Af Amer: 46 mL/min — ABNORMAL LOW (ref >60)
Glucose, Bld: 135 mg/dL — ABNORMAL HIGH (ref 70–99)
Potassium: 4.1 mmol/L (ref 3.5–5.1)
Sodium: 140 mmol/L (ref 135–145)

## 2020-02-04 LAB — MAGNESIUM: Magnesium: 2 mg/dL (ref 1.7–2.4)

## 2020-02-04 LAB — GLUCOSE, CAPILLARY
Glucose-Capillary: 164 mg/dL — ABNORMAL HIGH (ref 70–99)
Glucose-Capillary: 171 mg/dL — ABNORMAL HIGH (ref 70–99)
Glucose-Capillary: 133 mg/dL — ABNORMAL HIGH (ref 70–99)
Glucose-Capillary: 127 mg/dL — ABNORMAL HIGH (ref 70–99)

## 2020-02-04 MED ORDER — FUROSEMIDE 10 MG/ML IJ SOLN
80.0000 mg | Freq: Two times a day (BID) | INTRAMUSCULAR | Status: DC
  Administered 2020-02-04 – 2020-02-05 (×3): 80 mg via INTRAVENOUS
  Filled 2020-02-04 (×2): qty 8

## 2020-02-04 NOTE — Procedures (Signed)
Patient declined CPAP for tonight.  

## 2020-02-04 NOTE — Plan of Care (Signed)
  Problem: Clinical Measurements: Goal: Ability to avoid or minimize complications of infection will improve Outcome: Progressing   Problem: Skin Integrity: Goal: Skin integrity will improve Outcome: Progressing   Problem: Education: Goal: Knowledge of General Education information will improve Description: Including pain rating scale, medication(s)/side effects and non-pharmacologic comfort measures Outcome: Progressing   Problem: Health Behavior/Discharge Planning: Goal: Ability to manage health-related needs will improve Outcome: Progressing   

## 2020-02-04 NOTE — Progress Notes (Signed)
RN educated patient on importance and purpose of telemetry monitoring. Patient agreed to wearing telemetry box and verbalizes compliance. Telemetry box placed.

## 2020-02-04 NOTE — Progress Notes (Signed)
Bladder scan reads 280 pt is refusing in and out cath  He states he has had one before  He will not allow another  MD notified  Will continue to monitor

## 2020-02-04 NOTE — Progress Notes (Signed)
Pt is refusing tele at this time

## 2020-02-04 NOTE — Progress Notes (Signed)
Subjective: HD#2 Events Overnight: no events overnight.  Patient was seen this morning on rounds.  Patient is resting comfortably in his recliner.  States that he is having stable serous discharge from his leg.  The fluid is saturating his left lower extremity wrap with fluid on his surrounding blanket.  Patient denies any pain or worsening swelling.  Denies chest pain, shortness of breath, orthopnea.  Objective:  Vital signs in last 24 hours: Vitals:   02/03/20 0815 02/03/20 1233 02/03/20 2015 02/04/20 0337  BP: 111/73 108/66 119/66 133/79  Pulse:  73 76 69  Resp:  18 17 17   Temp:  98.8 F (37.1 C) 97.8 F (36.6 C) (!) 97.4 F (36.3 C)  TempSrc:  Oral Oral Oral  SpO2: 95% 96% 95% 100%  Weight:      Height:       Supplemental O2: RA   Physical Exam: Physical Exam  Constitutional: He is oriented to person, place, and time and well-developed, well-nourished, and in no distress.  HENT:  Head: Normocephalic and atraumatic.  Eyes: EOM are normal.  Cardiovascular: Normal rate and intact distal pulses.  Pulmonary/Chest: Effort normal. No respiratory distress.  Abdominal: Soft. He exhibits no distension.  Musculoskeletal:        General: Normal range of motion.     Cervical back: Normal range of motion.     Comments: Patient continues to have significant lower extremity edema particularly on the left.  He has significant serous drainage that is saturating his Unna boots and surrounding blankets.  Patient denies any pain.  He continues to have 2+ pitting edema to the knees.  Neurological: He is alert and oriented to person, place, and time.  Skin: Skin is dry.    Filed Weights   02/01/20 0300 02/02/20 0606 02/03/20 0140  Weight: 134.4 kg 132 kg 131.1 kg     Intake/Output Summary (Last 24 hours) at 02/04/2020 7341 Last data filed at 02/04/2020 0500 Gross per 24 hour  Intake 1490 ml  Output 675 ml  Net 815 ml  + 2.2 L since admission   Risk Score:  n/a  Pertinent  labs/Imaging: CBC Latest Ref Rng & Units 02/02/2020 02/01/2020 01/31/2020  WBC 4.0 - 10.5 K/uL 9.7 11.1(H) 9.9  Hemoglobin 13.0 - 17.0 g/dL 11.8(L) 12.0(L) 12.5(L)  Hematocrit 39.0 - 52.0 % 36.9(L) 37.9(L) 40.2  Platelets 150 - 400 K/uL 263 258 246    CMP Latest Ref Rng & Units 02/04/2020 02/03/2020 02/02/2020  Glucose 70 - 99 mg/dL 135(H) 177(H) 120(H)  BUN 8 - 23 mg/dL 28(H) 24(H) 18  Creatinine 0.61 - 1.24 mg/dL 1.51(H) 1.43(H) 1.34(H)  Sodium 135 - 145 mmol/L 140 141 139  Potassium 3.5 - 5.1 mmol/L 4.1 3.5 3.4(L)  Chloride 98 - 111 mmol/L 103 99 100  CO2 22 - 32 mmol/L 26 28 26   Calcium 8.9 - 10.3 mg/dL 7.9(L) 8.1(L) 8.0(L)  Total Protein 6.5 - 8.1 g/dL - - -  Total Bilirubin 0.3 - 1.2 mg/dL - - -  Alkaline Phos 38 - 126 U/L - - -  AST 15 - 41 U/L - - -  ALT 0 - 44 U/L - - -    No results found.  Assessment/Plan:  Principal Problem:   Acute on chronic heart failure with preserved ejection fraction (HFpEF) (HCC) Active Problems:   Type 2 diabetes mellitus (HCC)   Lymphedema   Atrial fibrillation (HCC)   Acute exacerbation of CHF (congestive heart failure) ([place])    Patient  Summary: [name]. is a 69 y.o. with pertinent PMH of type 2 diabetes, hypertension, diastolic heart failure, chronic lymphedema/venous stasis who presented with hypervolemia and admit for acute on chronic heart failure on hospital day 2  #Acute on chronic heart failure with mildly reduced ejection fraction: Patient presents with new onset acute on chronic heart failure with a new mildly reduced ejection fraction.  Patient's ins and outs do not appear to be correct as he has lost at least 11 pounds during this admission.  He denies any symptoms of shortness of breath, chest pain, orthopnea.  His lower extremities still have pitting edema particularly in his left leg with 2+ pitting edema to his knees.  Recent cardiology note from 11/2019 states that the patient's LEE is thought to be due to venous  insufficiency secondary to morbid obesity despite previous venous insufficiency evaluation being within normal limits.  Patient also had an ABI's at a previous vascular surgery appointment that was within normal limits.  Patient was put on torsemide and sent to OP wound care with minimal improvement.  - We will continue IV torsemide 80 mg to try to remove excess fluid. - Continue daily weights, strict ins and outs, fluid restriction - Patient may benefit from pneumatic compression devices and rewrapping una boot.  #Atrial fibrillation: #PVCs Patient remains in sinus rhythm we will continue Eliquis and metoprolol as prescribed. -Continue Eliquis -Continue metoprolol   Diet: Heart Healthy IVF: None,None VTE: NOAC Code: Full PT/OT recs: None TOC recs: none   Dispo: Anticipated discharge tomorrow.    Marianna Payment, D.O. MCIMTP, PGY-1 Date 02/04/2020 Time 6:09 AM

## 2020-02-04 NOTE — Care Management (Signed)
Per Iona Coach W/[employer] help desk: Co-pay amount for Eliquis 2.5 mg and or '5mg'$ . Bid for a 30 day supply $131.00.  No PA required Deductible not met Tier 3 Retail pharmacy  [facility]  No LIS. Ref# [id number] 02/04/2020

## 2020-02-05 DIAGNOSIS — I472 Ventricular tachycardia: Secondary | ICD-10-CM

## 2020-02-05 DIAGNOSIS — R600 Localized edema: Secondary | ICD-10-CM

## 2020-02-05 LAB — COMPREHENSIVE METABOLIC PANEL
ALT: 28 U/L (ref 0–44)
AST: 39 U/L (ref 15–41)
Albumin: 3.1 g/dL — ABNORMAL LOW (ref 3.5–5.0)
Alkaline Phosphatase: 72 U/L (ref 38–126)
Anion gap: 10 (ref 5–15)
BUN: 32 mg/dL — ABNORMAL HIGH (ref 8–23)
CO2: 26 mmol/L (ref 22–32)
Calcium: 7.9 mg/dL — ABNORMAL LOW (ref 8.9–10.3)
Chloride: 100 mmol/L (ref 98–111)
Creatinine, Ser: 1.44 mg/dL — ABNORMAL HIGH (ref 0.61–1.24)
GFR calc Af Amer: 57 mL/min — ABNORMAL LOW (ref >60)
GFR calc non Af Amer: 49 mL/min — ABNORMAL LOW (ref >60)
Glucose, Bld: 194 mg/dL — ABNORMAL HIGH (ref 70–99)
Potassium: 3.6 mmol/L (ref 3.5–5.1)
Sodium: 136 mmol/L (ref 135–145)
Total Bilirubin: 0.7 mg/dL (ref 0.3–1.2)
Total Protein: 7.3 g/dL (ref 6.5–8.1)

## 2020-02-05 LAB — GLUCOSE, CAPILLARY
Glucose-Capillary: 167 mg/dL — ABNORMAL HIGH (ref 70–99)
Glucose-Capillary: 125 mg/dL — ABNORMAL HIGH (ref 70–99)
Glucose-Capillary: 122 mg/dL — ABNORMAL HIGH (ref 70–99)
Glucose-Capillary: 181 mg/dL — ABNORMAL HIGH (ref 70–99)

## 2020-02-05 LAB — CULTURE, BLOOD (ROUTINE X 2)
Culture: NO GROWTH
Culture: NO GROWTH
Special Requests: ADEQUATE

## 2020-02-05 LAB — MAGNESIUM: Magnesium: 2 mg/dL (ref 1.7–2.4)

## 2020-02-05 MED ORDER — POTASSIUM CHLORIDE CRYS ER 20 MEQ PO TBCR
20.0000 meq | EXTENDED_RELEASE_TABLET | Freq: Once | ORAL | Status: AC
  Administered 2020-02-05: 20 meq via ORAL
  Filled 2020-02-05: qty 1

## 2020-02-05 MED ORDER — MAGNESIUM SULFATE 4 GM/100ML IV SOLN
4.0000 g | Freq: Once | INTRAVENOUS | Status: AC
  Administered 2020-02-05: 4 g via INTRAVENOUS
  Filled 2020-02-05: qty 100

## 2020-02-05 MED ORDER — TORSEMIDE 20 MG PO TABS: 20.0000 mg | ORAL_TABLET | Freq: Two times a day (BID) | ORAL | Status: DC

## 2020-02-05 MED ORDER — POTASSIUM CHLORIDE CRYS ER 20 MEQ PO TBCR
40.0000 meq | EXTENDED_RELEASE_TABLET | Freq: Once | ORAL | Status: AC
  Administered 2020-02-05: 40 meq via ORAL
  Filled 2020-02-05: qty 2

## 2020-02-05 MED ORDER — FUROSEMIDE 10 MG/ML IJ SOLN
80.0000 mg | Freq: Two times a day (BID) | INTRAMUSCULAR | Status: DC
  Administered 2020-02-05 – 2020-02-06 (×2): 80 mg via INTRAVENOUS
  Filled 2020-02-05 (×2): qty 8

## 2020-02-05 NOTE — Progress Notes (Signed)
Patient refuses to wear the CPAP since admission to the hospital. Please remove order by provider.

## 2020-02-05 NOTE — Progress Notes (Signed)
Pt claims he has been emptying urinal  throughout the night ,  Pt educated  Will continue to monitor

## 2020-02-05 NOTE — Consult Note (Signed)
WOC Nurse Consult Note: Patient receiving care in [facility] 3E10. Reason for Consult: LLE wound Wound type: weeping venous stasis, see photo Pressure Injury POA: Yes/No/NA Measurement: BLEs with compression wraps at time of my assessment.  Nurse states they were put on a couple of days ago. Wound bed: see image Drainage (amount, consistency, odor) heavy serous on towels under left foot Periwound: Dressing procedure/placement/frequency: I have ordered BLE unna boots by ortho tech, and to change the LLE unna boot when soiled. Thank you for the consult.  Discussed plan of care with the patient and bedside nurse.  [place] nurse will not follow at this time.  Please re-consult the [place] team if needed.  Val Riles, RN, MSN, CWOCN, CNS-BC, pager [phone]

## 2020-02-05 NOTE — Plan of Care (Signed)
  Problem: Clinical Measurements: Goal: Ability to avoid or minimize complications of infection will improve Outcome: Progressing   Problem: Skin Integrity: Goal: Skin integrity will improve Outcome: Progressing   Problem: Education: Goal: Knowledge of General Education information will improve Description: Including pain rating scale, medication(s)/side effects and non-pharmacologic comfort measures Outcome: Progressing   Problem: Health Behavior/Discharge Planning: Goal: Ability to manage health-related needs will improve Outcome: Progressing   

## 2020-02-05 NOTE — Progress Notes (Signed)
Orthopedic Tech Progress Note Patient Details:  [name]. 11-06-1951 [id number]  Ortho Devices Type of Ortho Device: Louretta Parma boot Ortho Device/Splint Location: BILATERAL Ortho Device/Splint Interventions: Application, Ordered   Post Interventions Patient Tolerated: Well Instructions Provided: Care of device, Adjustment of device   Janit Pagan 02/05/2020, 11:19 AM

## 2020-02-05 NOTE — Progress Notes (Addendum)
Per CCMD, patient had a 12 beat run of V-tach. Patient denies chest pain, shortness of breath, dizziness or any discomfort. Current vitals are as follows:    02/05/20 0818  Vitals  Temp 98.2 F (36.8 C)  Temp Source Oral  BP (!) 121/58  MAP (mmHg) 76  BP Location Left Arm  BP Method Automatic  Patient Position (if appropriate) Sitting  Pulse Rate 76  Pulse Rate Source Monitor  Resp 18  Level of Consciousness  Level of Consciousness Alert  Oxygen Therapy  SpO2 95 %  O2 Device Room Air  MEWS Score  MEWS Temp 0  MEWS Systolic 0  MEWS Pulse 0  MEWS RR 0  MEWS LOC 0  MEWS Score 0  MEWS Score Color Berton Bon, MD paged.

## 2020-02-05 NOTE — Progress Notes (Addendum)
Subjective: HD#3 Events Overnight: This AM, patient had 12 beats VT, he remained asymptomatic during this event.   Patient was seen this morning on rounds. Patient states he feel great today, denies CP or SOB. He continues to have significant drainage but is not having any associated pain.   He lives at home with his wife, who does not help manage his legs. He is not limited at all in his activity level or lifestyle. He is planning to [place] land once he gets home.   Emphasized importance of elevation.   Objective:  Vital signs in last 24 hours: Vitals:   02/04/20 0847 02/04/20 1337 02/04/20 2128 02/05/20 0100  BP: (!) 123/57 111/71 125/67   Pulse: 77 73 72   Resp: 16 18 18    Temp:  98.2 F (36.8 C) 97.7 F (36.5 C)   TempSrc:  Oral Oral   SpO2: 94% 99% 97%   Weight:    129.5 kg  Height:       Supplemental O2: RA   Physical Exam: Physical Exam  Constitutional: He is oriented to person, place, and time and well-developed, well-nourished, and in no distress.  HENT:  Head: Normocephalic and atraumatic.  Eyes: EOM are normal.  Cardiovascular: Normal rate and intact distal pulses.  Pulmonary/Chest: Effort normal and breath sounds normal. No respiratory distress. He exhibits no tenderness.  Abdominal: Soft. He exhibits no distension. There is no abdominal tenderness.  Musculoskeletal:        General: Edema present. No tenderness. Normal range of motion.     Cervical back: Normal range of motion.  Neurological: He is alert and oriented to person, place, and time.  Skin: Skin is warm and dry.         Filed Weights   02/03/20 0140 02/04/20 0625 02/05/20 0100  Weight: 131.1 kg 130.4 kg 129.5 kg  Patient is down 1 kg (2.2 Lbs)   Intake/Output Summary (Last 24 hours) at 02/05/2020 0559 Last data filed at 02/04/2020 1700 Gross per 24 hour  Intake 840 ml  Output 1100 ml  Net -260 ml    Risk Score: N/A  Pertinent labs/Imaging: CBC Latest Ref Rng & Units  02/02/2020 02/01/2020 01/31/2020  WBC 4.0 - 10.5 K/uL 9.7 11.1(H) 9.9  Hemoglobin 13.0 - 17.0 g/dL 11.8(L) 12.0(L) 12.5(L)  Hematocrit 39.0 - 52.0 % 36.9(L) 37.9(L) 40.2  Platelets 150 - 400 K/uL 263 258 246    CMP Latest Ref Rng & Units 02/05/2020 02/04/2020 02/03/2020  Glucose 70 - 99 mg/dL 194(H) 135(H) 177(H)  BUN 8 - 23 mg/dL 32(H) 28(H) 24(H)  Creatinine 0.61 - 1.24 mg/dL 1.44(H) 1.51(H) 1.43(H)  Sodium 135 - 145 mmol/L 136 140 141  Potassium 3.5 - 5.1 mmol/L 3.6 4.1 3.5  Chloride 98 - 111 mmol/L 100 103 99  CO2 22 - 32 mmol/L 26 26 28   Calcium 8.9 - 10.3 mg/dL 7.9(L) 7.9(L) 8.1(L)  Total Protein 6.5 - 8.1 g/dL 7.3 - -  Total Bilirubin 0.3 - 1.2 mg/dL 0.7 - -  Alkaline Phos 38 - 126 U/L 72 - -  AST 15 - 41 U/L 39 - -  ALT 0 - 44 U/L 28 - -   Spot protein/Cr ratio: pending    Assessment/Plan:  Principal Problem:   Acute on chronic heart failure with preserved ejection fraction (HFpEF) (HCC) Active Problems:   Type 2 diabetes mellitus (HCC)   Lymphedema   Atrial fibrillation (HCC)   Acute exacerbation of CHF (congestive heart  failure) [facility])    Patient Summary: [name]. is a 69 y.o. with pertinent PMH of type 2 diabetes, hypertension, diastolic heart failure, chronic lymphedema/venous stasis who presented with hypervolemia with chronic lower extremity swelling and admit for Acute on Chronic CHF on hospital day 3  #Acute on chronic heart failure with mildly reduced ejection fraction: Patient continues to have good diuresis.  He denies any symptoms concerning for heart failure exacerbation.  He has had minimal change in his left lower extremity overnight.  Patient states that he would like to be discharged today.  Will likely switch him over to oral diuretics today. - Recieved 80 mg IV torsemide with improvement of kidney function  -Restart him on 40 mg p.o. torsemide prior to discharge. - Down 1 kg overnight.    #Lower Extremity Edema: Patient has minimal  improvement of his left lower extremity edema.  On examination today there does not appear to be any skin breakdown or ulcerations present.  I will reach out to the wound care team to discuss the need for tighter wraps.  I encouraged leg elevation whenever possible and ambulation around the room. - Wound care consult.  - 4 K wraps - Ambulation with assistance and leg elevation  - I will switch him to his home dose of torsemide 40 mg p.o.  #Atrial fibrillation: #PVCs #Non-sustained VT Patient had a 12 beat run of V. tach.  He was asymptomatic through this episode denying chest pain, shortness of breath, dizziness.  Patient is back in sinus rhythm at this time.  We will continue him on metoprolol and Eliquis.  We will replete electrolytes as needed.   -Continue Eliquis -Continue metoprolol -Kdur 40 mEq -Magnesium level pending   Diet: Heart Healthy IVF: None,None VTE: NOAC Code: Full PT/OT recs: Pending TOC recs: pending   Dispo: Anticipated discharge today or tomorrow.    Marianna Payment, D.O. MCIMTP, PGY-1 Date 02/05/2020 Time 5:59 AM

## 2020-02-06 ENCOUNTER — Encounter (HOSPITAL_COMMUNITY): Payer: Self-pay | Admitting: Student in an Organized Health Care Education/Training Program

## 2020-02-06 ENCOUNTER — Telehealth: Payer: Self-pay | Admitting: Family Medicine

## 2020-02-06 DIAGNOSIS — Z7901 Long term (current) use of anticoagulants: Secondary | ICD-10-CM

## 2020-02-06 DIAGNOSIS — Z91048 Other nonmedicinal substance allergy status: Secondary | ICD-10-CM

## 2020-02-06 DIAGNOSIS — Z885 Allergy status to narcotic agent status: Secondary | ICD-10-CM

## 2020-02-06 LAB — BASIC METABOLIC PANEL
Anion gap: 10 (ref 5–15)
BUN: 29 mg/dL — ABNORMAL HIGH (ref 8–23)
CO2: 29 mmol/L (ref 22–32)
Calcium: 8.1 mg/dL — ABNORMAL LOW (ref 8.9–10.3)
Chloride: 99 mmol/L (ref 98–111)
Creatinine, Ser: 1.38 mg/dL — ABNORMAL HIGH (ref 0.61–1.24)
GFR calc Af Amer: 60 mL/min — ABNORMAL LOW (ref >60)
GFR calc non Af Amer: 51 mL/min — ABNORMAL LOW (ref >60)
Glucose, Bld: 139 mg/dL — ABNORMAL HIGH (ref 70–99)
Potassium: 3.7 mmol/L (ref 3.5–5.1)
Sodium: 138 mmol/L (ref 135–145)

## 2020-02-06 LAB — MAGNESIUM: Magnesium: 2.5 mg/dL — ABNORMAL HIGH (ref 1.7–2.4)

## 2020-02-06 LAB — GLUCOSE, CAPILLARY
Glucose-Capillary: 141 mg/dL — ABNORMAL HIGH (ref 70–99)
Glucose-Capillary: 177 mg/dL — ABNORMAL HIGH (ref 70–99)

## 2020-02-06 MED ORDER — APIXABAN 5 MG PO TABS: 5.0000 mg | ORAL_TABLET | Freq: Two times a day (BID) | ORAL | 0 refills | Status: DC

## 2020-02-06 MED ORDER — METOPROLOL TARTRATE 50 MG PO TABS: 50.0000 mg | ORAL_TABLET | Freq: Two times a day (BID) | ORAL | 3 refills | Status: DC

## 2020-02-06 MED ORDER — TORSEMIDE 20 MG PO TABS: 40.0000 mg | ORAL_TABLET | Freq: Two times a day (BID) | ORAL | 0 refills | Status: DC

## 2020-02-06 MED FILL — ELIQUIS 5 MG TABLET: 5 | 30 days supply | Qty: 60 | Fill #0

## 2020-02-06 MED FILL — TORSEMIDE 20 MG TABLET: 20 | 30 days supply | Qty: 120 | Fill #0

## 2020-02-06 MED FILL — METOPROLOL TARTRATE 50 MG T: 50 | 30 days supply | Qty: 60 | Fill #0

## 2020-02-06 NOTE — Progress Notes (Signed)
  Mobility Specialist Criteria Algorithm Info. Mobility Team:  [facility] elevated:Self regulated Activity:Refused mobility- RN notified;Stood at bedside (Was in chair prior to. When asked to ambulate in hallway patient was initially willing to do so but got very agitated when asked to put on his mask and socks. Went over the [employer] regarding masks and socks, in which he threw his mask down and told me to leave and he's not walking.) Range of motion: Active;All extremities Level of assistance: Independent after set-up Bed Position: Chair; Building services engineer Chair)   02/06/2020 11:49 AM

## 2020-02-06 NOTE — Telephone Encounter (Signed)
HFU per Dr Marianna Payment; pt appt 02/13/20 1015am

## 2020-02-06 NOTE — Progress Notes (Signed)
Subjective: HD#4 Events Overnight: No events overnight  Patient states that he is feeling well and is ready to go home.  He states that his left lower extremity has improved and believes he will be able to manage it when he gets home.  Counseled him on the importance of being consistent with his lower extremity edema management.  I explained that he will likely need consistent independent changes, physical therapy, and possible pneumatic compression.  All questions were thoroughly answered.   Objective:  Vital signs in last 24 hours: Vitals:   02/05/20 2023 02/05/20 2130 02/06/20 0156 02/06/20 0437  BP: 97/66 128/64  118/67  Pulse: 79   67  Resp: 20   19  Temp: 98 F (36.7 C)   97.7 F (36.5 C)  TempSrc: Oral   Oral  SpO2: 97%   96%  Weight:   128 kg   Height:       Supplemental O2: Room air   Physical Exam: Physical Exam  Constitutional: He is oriented to person, place, and time and well-developed, well-nourished, and in no distress.  HENT:  Head: Normocephalic and atraumatic.  Eyes: EOM are normal.  Cardiovascular: Normal rate and intact distal pulses.  No murmur heard. Pulmonary/Chest: Effort normal and breath sounds normal.  Abdominal: Soft. He exhibits no distension. There is no abdominal tenderness.  Musculoskeletal:        General: Edema present. No tenderness. Normal range of motion.     Cervical back: Normal range of motion.     Comments: Patient's lower extremity edema on his left side seems to be improving.  He continues to have at least 2+ edema up to his knees but has had significant volume diuresed during his hospital stay.  Redness has decreased.  Neurological: He is alert and oriented to person, place, and time.  Skin: Skin is warm and dry.    Filed Weights   02/04/20 0625 02/05/20 0100 02/06/20 0156  Weight: 130.4 kg 129.5 kg 128 kg  Almost 2 kg off overnight.   Intake/Output Summary (Last 24 hours) at 02/06/2020 0541 Last data filed at 02/06/2020  0300 Gross per 24 hour  Intake 1520.89 ml  Output 2500 ml  Net -979.11 ml    Risk Score:  N/A  Pertinent labs/Imaging: CBC Latest Ref Rng & Units 02/02/2020 02/01/2020 01/31/2020  WBC 4.0 - 10.5 K/uL 9.7 11.1(H) 9.9  Hemoglobin 13.0 - 17.0 g/dL 11.8(L) 12.0(L) 12.5(L)  Hematocrit 39.0 - 52.0 % 36.9(L) 37.9(L) 40.2  Platelets 150 - 400 K/uL 263 258 246    CMP Latest Ref Rng & Units 02/05/2020 02/04/2020 02/03/2020  Glucose 70 - 99 mg/dL 194(H) 135(H) 177(H)  BUN 8 - 23 mg/dL 32(H) 28(H) 24(H)  Creatinine 0.61 - 1.24 mg/dL 1.44(H) 1.51(H) 1.43(H)  Sodium 135 - 145 mmol/L 136 140 141  Potassium 3.5 - 5.1 mmol/L 3.6 4.1 3.5  Chloride 98 - 111 mmol/L 100 103 99  CO2 22 - 32 mmol/L 26 26 28   Calcium 8.9 - 10.3 mg/dL 7.9(L) 7.9(L) 8.1(L)  Total Protein 6.5 - 8.1 g/dL 7.3 - -  Total Bilirubin 0.3 - 1.2 mg/dL 0.7 - -  Alkaline Phos 38 - 126 U/L 72 - -  AST 15 - 41 U/L 39 - -  ALT 0 - 44 U/L 28 - -   Spot protein/creatinine ratio: Pending   Assessment/Plan:  Principal Problem:   Acute on chronic heart failure with preserved ejection fraction (HFpEF) (HCC) Active Problems:   Type 2  diabetes mellitus ([place])   Lymphedema   Atrial fibrillation (HCC)   Acute exacerbation of CHF (congestive heart failure) ([place])    Patient Summary: [name]. is a 69 y.o. with pertinent PMH of type 2 diabetes, hypertension, diastolic heart failure, chronic lymphedema who presented with hypervolemia and admit for acute on chronic heart failure complicated by significant left lower extremity edema on hospital day 4   #Acute on chronic heart failure with mildly reduced ejection fraction: Patient tolerating IV Lasix well with roughly 2 g loss in weight overnight. -Patient appears to be close to his dry weight at 128 kg. -I will switch him to oral torsemide today and discharge him on torsemide 40 mg twice daily.  #Lower Extremity Edema: Patient will need multidiscip pulmonary approach to his  lower extremity edema.  He will need consistent follow-up with Unna boot dressing changes upwards of 3 times a week.  He will likely benefit from intermittent pneumatic compression at home with weekly physical therapy.  He will need to continue to follow-up with his wound care doctors.  I will provide him information to see wound care professional in [place] as [name] is a long drive for him. -Patient was scheduled for new wound care appointment tomorrow in [place]. -We will replace Unna boot today as needed for him to be discharged today.  #Atrial fibrillation: #PVCs #Non-sustained VT Patient is in sinus rhythm and has not had any further beats source of VT or atrial fibrillation. - Continue Eliquis, metoprolol as prescribed -He will need follow-up with his primary care doctor for further evaluation and management of his atrial fibrillation and anticoagulation. -Hospital follow-up appointment was scheduled with his PCP the week following his discharge.  Diet: Heart Healthy IVF: None,None VTE: NOAC Code: Full PT/OT recs: None TOC recs: None   Dispo: Anticipated discharge today.    Marianna Payment, D.O. MCIMTP, PGY-1 Date 02/06/2020 Time 5:41 AM

## 2020-02-06 NOTE — Care Management Important Message (Signed)
Important Message  Patient Details  Name: [name]. MRN: [id number] Date of Birth: [birth year]   Medicare Important Message Given:  Yes     [name] 02/06/2020, 10:12 AM

## 2020-02-07 ENCOUNTER — Encounter: Payer: Medicare Other | Admitting: Physician Assistant

## 2020-02-07 ENCOUNTER — Other Ambulatory Visit: Payer: Self-pay

## 2020-02-07 ENCOUNTER — Encounter (HOSPITAL_BASED_OUTPATIENT_CLINIC_OR_DEPARTMENT_OTHER): Payer: Medicare Other | Attending: Internal Medicine | Admitting: Internal Medicine

## 2020-02-07 DIAGNOSIS — Z87891 Personal history of nicotine dependence: Secondary | ICD-10-CM | POA: Diagnosis not present

## 2020-02-07 DIAGNOSIS — I110 Hypertensive heart disease with heart failure: Secondary | ICD-10-CM | POA: Insufficient documentation

## 2020-02-07 DIAGNOSIS — L97221 Non-pressure chronic ulcer of left calf limited to breakdown of skin: Secondary | ICD-10-CM | POA: Diagnosis not present

## 2020-02-07 DIAGNOSIS — I5023 Acute on chronic systolic (congestive) heart failure: Secondary | ICD-10-CM | POA: Diagnosis not present

## 2020-02-07 DIAGNOSIS — E119 Type 2 diabetes mellitus without complications: Secondary | ICD-10-CM | POA: Diagnosis not present

## 2020-02-07 DIAGNOSIS — I87322 Chronic venous hypertension (idiopathic) with inflammation of left lower extremity: Secondary | ICD-10-CM | POA: Insufficient documentation

## 2020-02-07 DIAGNOSIS — I890 Lymphedema, not elsewhere classified: Secondary | ICD-10-CM | POA: Diagnosis not present

## 2020-02-07 NOTE — Progress Notes (Addendum)
[name] ([id number]) Visit Report for 02/07/2020 Allergy List Details Patient Name: Date of Service: [name], [name]. 02/07/2020 9:00 AM Medical Record [id number] Patient Account Number: [id number] Date of Birth/Sex: Treating RN: September 04, 1951 (69 y.o. Male) Kela Millin Primary Care Lew Prout: Aura Dials Other Clinician: Referring Siedah Sedor: Treating Isley Zinni/Extender:Robson, Suzi Roots, Tacy Learn in Treatment: 0 Allergies Active Allergies codeine sulfur Allergy Notes Electronic Signature(s) Signed: 02/07/2020 6:20:13 PM By: Kela Millin Entered By: Kela Millin on 02/07/2020 09:43:43 -------------------------------------------------------------------------------- Arrival Information Details Patient Name: Date of Service: [name]. 02/07/2020 9:00 AM Medical Record [id number] Patient Account Number: [id number] Date of Birth/Sex: Treating RN: 16-Oct-1951 (69 y.o. Male) Kela Millin Primary Care Brytnee Bechler: Aura Dials Other Clinician: Referring Billyjoe Go: Treating Izaha Shughart/Extender:Robson, Suzi Roots, Tacy Learn in Treatment: 0 Visit Information Patient Arrived: Ambulatory Arrival Time: 09:36 Accompanied By: self Transfer Assistance: None Patient Identification Verified: Yes Secondary Verification Process Yes Completed: Patient Has Alerts: Yes Patient Alerts: Patient on Blood Thinner ABI, BIL: non compress Electronic Signature(s) Signed: 02/07/2020 6:20:13 PM By: Kela Millin Entered By: Kela Millin on 02/07/2020 11:13:55 -------------------------------------------------------------------------------- Clinic Level of Care Assessment Details Patient Name: Date of Service: [name] 02/07/2020 9:00 AM Medical Record Number:[id number] Patient Account Number: [id number] Date of Birth/Sex: Treating RN: Nov 09, 1951 (69 y.o. Male) Deon Pilling Primary Care Johnatan Baskette: Aura Dials Other  Clinician: Referring Darinda [place]: Treating Leasa Kincannon/Extender:Robson, Suzi Roots, Tacy Learn in Treatment: 0 Clinic Level of Care Assessment Items TOOL 1 Quantity Score X - Use when EandM and Procedure is performed on INITIAL visit 1 0 ASSESSMENTS - Nursing Assessment / Reassessment X - General Physical Exam (combine w/ comprehensive assessment (listed just below) 1 20 when performed on new pt. evals) X - Comprehensive Assessment (HX, ROS, Risk Assessments, Wounds Hx, etc.) 1 25 ASSESSMENTS - Wound and Skin Assessment / Reassessment X - Dermatologic / Skin Assessment (not related to wound area) 1 10 ASSESSMENTS - Ostomy and/or Continence Assessment and Care []  - Incontinence Assessment and Management 0 []  - Ostomy Care Assessment and Management (repouching, etc.) 0 PROCESS - Coordination of Care X - Simple Patient / Family Education for ongoing care 1 15 []  - Complex (extensive) Patient / Family Education for ongoing care 0 X - Staff obtains Programmer, systems, Records, Test Results / Process Orders 1 10 []  - Staff telephones HHA, Nursing Homes / Clarify orders / etc 0 []  - Routine Transfer to another Facility (non-emergent condition) 0 []  - Routine Hospital Admission (non-emergent condition) 0 X - New Admissions / Biomedical engineer / Ordering NPWT, Apligraf, etc. 1 15 []  - Emergency Hospital Admission (emergent condition) 0 PROCESS - Special Needs []  - Pediatric / Minor Patient Management 0 []  - Isolation Patient Management 0 []  - Hearing / Language / Visual special needs 0 []  - Assessment of Community assistance (transportation, D/C planning, etc.) 0 []  - Additional assistance / Altered mentation 0 []  - Support Surface(s) Assessment (bed, cushion, seat, etc.) 0 INTERVENTIONS - Miscellaneous []  - External ear exam 0 []  - Patient Transfer (multiple staff / Civil Service fast streamer / Similar devices) 0 []  - Simple Staple / Suture removal (25 or less) 0 []  - Complex Staple / Suture removal (26  or more) 0 []  - Hypo/Hyperglycemic Management (do not check if billed separately) 0 X - Ankle / Brachial Index (ABI) - do not check if billed separately 1 15 Has the patient been seen at the hospital within the last three years: Yes Total Score: 110 Level Of Care: New/Established - Level 3 Electronic Signature(s) Signed:  02/07/2020 6:22:19 PM By: Deon Pilling Entered By: Deon Pilling on 02/07/2020 11:09:11 -------------------------------------------------------------------------------- Compression Therapy Details Patient Name: Date of Service: [name] 02/07/2020 9:00 AM Medical Record [id number] Patient Account Number: [id number] Date of Birth/Sex: Treating RN: Oct 27, 1951 (69 y.o. Male) Deon Pilling Primary Care Marquise Wicke: Aura Dials Other Clinician: Referring Dayami Taitt: Treating Psalm Schappell/Extender:Robson, Suzi Roots, Tacy Learn in Treatment: 0 Compression Therapy Performed for Wound Wound #1 Left,Circumferential Lower Leg Assessment: Performed By: Clinician Baruch Gouty, RN Compression Type: Four Layer Post Procedure Diagnosis Same as Pre-procedure Electronic Signature(s) Signed: 02/07/2020 6:22:19 PM By: Deon Pilling Entered By: Deon Pilling on 02/07/2020 11:06:30 -------------------------------------------------------------------------------- Encounter Discharge Information Details Patient Name: Date of Service: [name]. 02/07/2020 9:00 AM Medical Record [id number] Patient Account Number: [id number] Date of Birth/Sex: Treating RN: 1951-05-22 (69 y.o. Male) Baruch Gouty Primary Care Khiley Lieser: Aura Dials Other Clinician: Referring Shandrell Boda: Treating Joie Hipps/Extender:Robson, Suzi Roots, Tacy Learn in Treatment: 0 Encounter Discharge Information Items Discharge Condition: Stable Ambulatory Status: Ambulatory Discharge Destination: Home Transportation: Private Auto Accompanied By: self Schedule Follow-up  Appointment: Yes Clinical Summary of Care: Patient Declined Electronic Signature(s) Signed: 02/07/2020 5:37:17 PM By: Baruch Gouty RN, BSN Entered By: Baruch Gouty on 02/07/2020 11:27:57 -------------------------------------------------------------------------------- Lower Extremity Assessment Details Patient Name: Date of Service: [name]. 02/07/2020 9:00 AM Medical Record [id number] Patient Account Number: [id number] Date of Birth/Sex: Treating RN: 14-Nov-1951 (69 y.o. Male) Kela Millin Primary Care Antonella Upson: Aura Dials Other Clinician: Referring Yair Dusza: Treating Tanja Gift/Extender:Robson, Suzi Roots, Tacy Learn in Treatment: 0 Edema Assessment Assessed: [Left: No] [Right: No] Edema: [Left: Yes] [Right: Yes] Calf Left: Right: Point of Measurement: 42 cm From Medial Instep 50 cm 47 cm Ankle Left: Right: Point of Measurement: 17 cm From Medial Instep 31 cm 31 cm Vascular Assessment Pulses: Dorsalis Pedis Palpable: [Left:No] [Right:No] Electronic Signature(s) Signed: 02/07/2020 6:20:13 PM By: Kela Millin Entered By: Kela Millin on 02/07/2020 10:12:15 -------------------------------------------------------------------------------- Multi Wound Chart Details Patient Name: Date of Service: [name]. 02/07/2020 9:00 AM Medical Record [id number] Patient Account Number: [id number] Date of Birth/Sex: Treating RN: November 03, 1951 (69 y.o. Male) Deon Pilling Primary Care Pessy Delamar: Aura Dials Other Clinician: Referring Topacio Cella: Treating Sky Primo/Extender:Robson, Suzi Roots, Tacy Learn in Treatment: 0 Vital Signs Height(in): 31 Pulse(bpm): 69 Weight(lbs): 49 Blood Pressure(mmHg): 139/72 Body Mass Index(BMI): 41 Temperature(F): 97.9 Respiratory 20 Rate(breaths/min): Photos: [1:No Photos] [N/A:N/A] Wound Location: [1:Left Lower Leg - Circumferential] [N/A:N/A] Wounding Event: [1:Gradually Appeared]  [N/A:N/A] Primary Etiology: [1:Lymphedema] [N/A:N/A] Comorbid History: [1:Arrhythmia, Congestive Heart Failure, Hypertension, Type II Diabetes] [N/A:N/A] Date Acquired: [1:07/18/2019] [N/A:N/A] Weeks of Treatment: [1:0] [N/A:N/A] Wound Status: [1:Open] [N/A:N/A] Measurements L x W x D 13x32x0.1 [N/A:N/A] (cm) Area (cm) : [7:494.496] [N/A:N/A] Volume (cm) : [1:32.673] [N/A:N/A] % Reduction in Area: [1:0.00%] [N/A:N/A] % Reduction in Volume: 0.00% [N/A:N/A] Classification: [1:Full Thickness Without Exposed Support Structures] [N/A:N/A] Exudate Amount: [1:Large] [N/A:N/A] Exudate Type: [1:Purulent] [N/A:N/A] Exudate Color: [1:yellow, brown, green] [N/A:N/A] Wound Margin: [1:Distinct, outline attached] [N/A:N/A] Granulation Amount: [1:Large (67-100%)] [N/A:N/A] Granulation Quality: [1:Red] [N/A:N/A] Necrotic Amount: [1:None Present (0%)] [N/A:N/A] Exposed Structures: [1:Fascia: No Fat Layer (Subcutaneous Tissue) Exposed: No Tendon: No Muscle: No Joint: No Bone: No Compression Therapy] [N/A:N/A N/A] Treatment Notes Wound #1 (Left, Circumferential Lower Leg) 2. Periwound Care Antifungal cream Barrier cream TCA Cream 4. Secondary Dressing ABD Pad 6. Support Layer Applied 4 layer compression Water quality scientist) Signed: 02/07/2020 6:03:33 PM By: Linton Ham MD Signed: 02/07/2020 6:22:19 PM By: Deon Pilling Entered By: Linton Ham on 02/07/2020 12:59:46 -------------------------------------------------------------------------------- Multi-Disciplinary Care Plan Details Patient Name:  Date of Service: MACARTHUR, LORUSSO 02/07/2020 9:00 AM Medical Record [id number] Patient Account Number: [id number] Date of Birth/Sex: Treating RN: 01/29/1951 (69 y.o. Male) Deon Pilling Primary Care Tapanga [place]ta[place]: Aura Dials Other Clinician: Referring Michae Grimley: Treating Tyana Butzer/Extender:Robson, Suzi Roots, Tacy Learn in Treatment: 0 Active Inactive Electronic  Signature(s) Signed: 02/07/2020 6:22:19 PM By: Deon Pilling Entered By: Deon Pilling on 02/07/2020 11:56:48 -------------------------------------------------------------------------------- Pain Assessment Details Patient Name: Date of Service: BRYSAN, MCEVOY 02/07/2020 9:00 AM Medical Record [id number] Patient Account Number: [id number] Date of Birth/Sex: Treating RN: 09/29/1951 (69 y.o. Male) Kela Millin Primary Care Deloss Amico: Aura Dials Other Clinician: Referring Ziva Nunziata: Treating Hughie Melroy/Extender:Robson, Suzi Roots, Tacy Learn in Treatment: 0 Active Problems Location of Pain Severity and Description of Pain Patient Has Paino No Site Locations Pain Management and Medication Current Pain Management: Electronic Signature(s) Signed: 02/07/2020 6:20:13 PM By: Kela Millin Entered By: Kela Millin on 02/07/2020 10:12:29 -------------------------------------------------------------------------------- Patient/Caregiver Education Details Patient Name: Date of Service: Pedley, Ezreal M. 3/25/2021andnbsp9:00 AM Medical Record Patient Account Number: [id number] Number: Treating RN: Deon Pilling Date of Birth/Gender: [birth year] (69 y.o. Male) Other Clinician: Primary Care Physician: Aura Dials Treating Physician/Extender:Robson, Legrand Como Referring Physician: Quenten Raven in Treatment: 0 Education Assessment Education Provided To: Patient Education Topics Provided Nutrition: Handouts: Nutrition Methods: Explain/Verbal, Printed Responses: Reinforcements needed Welcome To [facility]: Handouts: Welcome [facility] Methods: Explain/Verbal, Printed Responses: Reinforcements needed Electronic Signature(s) Signed: 02/07/2020 6:22:19 PM By: Deon Pilling Entered By: Deon Pilling on 02/07/2020 09:30:28 -------------------------------------------------------------------------------- Wound Assessment  Details Patient Name: Date of Service: [name]. 02/07/2020 9:00 AM Medical Record [id number] Patient Account Number: [id number] Date of Birth/Sex: Treating RN: 09-Dec-1950 (69 y.o. Male) Deon Pilling Primary Care Shawnna Pancake: Aura Dials Other Clinician: Referring Chesnee Floren: Treating Colin Norment/Extender:Robson, Suzi Roots, Tacy Learn in Treatment: 0 Wound Status Wound Number: 1 Primary Lymphedema Etiology: Wound Location: Left, Circumferential Lower Leg Wound Not Healed Wounding Event: Gradually Appeared Status: Date Acquired: 07/18/2019 Comorbid Arrhythmia, Congestive Heart Failure, Weeks Of Treatment: 0 History: Hypertension, Type II Diabetes Clustered Wound: No Photos Wound Measurements Length: (cm) 13 % Reductio Width: (cm) 32 % Reductio Depth: (cm) 0.1 Tunneling: Area: (cm) 326.726 Undermini Volume: (cm) 32.673 Wound Description Classification: Full Thickness Without Exposed Support Foul Odo Structures Slough/F Wound Distinct, outline attached Margin: Exudate Large Amount: Exudate Purulent Type: Exudate yellow, brown, green Color: Wound Bed Granulation Amount: Large (67-100%) Granulation Quality: Red Fascia E Necrotic Amount: None Present (0%) Fat Laye Tendon E Muscle E Joint Ex Bone Exp r After Cleansing: No ibrino No Exposed Structure xposed: No r (Subcutaneous Tissue) Exposed: No xposed: No xposed: No posed: No osed: No n in Area: 0% n in Volume: 0% No ng: No Treatment Notes Wound #1 (Left, Circumferential Lower Leg) 2. Periwound Care Antifungal cream Barrier cream TCA Cream 4. Secondary Dressing ABD Pad 6. Support Layer Applied 4 layer compression wrap Electronic Signature(s) Signed: 02/15/2020 4:58:07 PM By: Deon Pilling Signed: 02/18/2020 3:38:17 PM By: Mikeal Hawthorne EMT/HBOT Previous Signature: 02/07/2020 6:22:19 PM Version By: Deon Pilling Entered By: Mikeal Hawthorne on 02/15/2020  15:30:56 -------------------------------------------------------------------------------- Vitals Details Patient Name: Date of Service: [name]. 02/07/2020 9:00 AM Medical Record [id number] Patient Account Number: [id number] Date of Birth/Sex: Treating RN: December 15, 1950 (69 y.o. Male) Kela Millin Primary Care Britton Bera: Aura Dials Other Clinician: Referring Precilla Purnell: Treating Tiffanee Mcnee/Extender:Robson, Suzi Roots, Tacy Learn in Treatment: 0 Vital Signs Time Taken: 09:40 Temperature (F): 97.9 Height (in): 69 Pulse (bpm): 43 Source: Stated Respiratory Rate (breaths/min): 20 Weight (lbs):  280 Blood Pressure (mmHg): 139/72 Source: Stated Reference Range: 80 - 120 mg / dl Body Mass Index (BMI): 41.3 Notes patient does not check his CBG's Electronic Signature(s) Signed: 02/07/2020 6:20:13 PM By: Kela Millin Entered By: Kela Millin on 02/07/2020 09:43:21

## 2020-02-07 NOTE — Progress Notes (Signed)
[name], [place] ([id number]) Visit Report for 02/07/2020 Abuse/Suicide Risk Screen Details Patient Name: Date of Service: [name], [name] 02/07/2020 9:00 AM Medical Record [id number] Patient Account Number: [id number] Date of Birth/Sex: Treating RN: 1951-05-11 (69 y.o. Marvis Repress Primary Care Katia Hannen: Aura Dials Other Clinician: Referring Asiyah Pineau: Treating Azizi Bally/Extender:Robson, Suzi Roots, Tacy Learn in Treatment: 0 Abuse/Suicide Risk Screen Items Answer ABUSE RISK SCREEN: Has anyone close to you tried to hurt or harm you recentlyo No Do you feel uncomfortable with anyone in your familyo No Has anyone forced you do things that you didnt want to doo No Electronic Signature(s) Signed: 02/07/2020 6:20:13 PM By: Kela Millin Entered By: Kela Millin on 02/07/2020 09:52:11 -------------------------------------------------------------------------------- Activities of Daily Living Details Patient Name: Date of Service: Keith, [name] 02/07/2020 9:00 AM Medical Record Number:[id number] Patient Account Number: [id number] Date of Birth/Sex: Treating RN: 18-Oct-1951 (69 y.o. Marvis Repress Primary Care Kella Splinter: Aura Dials Other Clinician: Referring Alany Borman: Treating Lamis Behrmann/Extender:Robson, Suzi Roots, Tacy Learn in Treatment: 0 Activities of Daily Living Items Answer Activities of Daily Living (Please select one for each item) Drive Automobile Completely Able Take Medications Completely Able Use Telephone Completely Able Care for Appearance Completely Able Use Toilet Completely Able Bath / Shower Completely Able Dress Self Completely Able Feed Self Completely Able Walk Completely Able Get In / Out Bed Completely Able Housework Completely Able Prepare Meals Completely [place] Completely Able Shop for Self Completely Able Electronic Signature(s) Signed: 02/07/2020 6:20:13 PM By: Kela Millin Entered  By: Kela Millin on 02/07/2020 09:52:57 -------------------------------------------------------------------------------- Education Screening Details Patient Name: Date of Service: [name]. 02/07/2020 9:00 AM Medical Record [id number] Patient Account Number: [id number] Date of Birth/Sex: Treating RN: [birth year] (69 y.o. Marvis Repress Primary Care Tyresse Jayson: Aura Dials Other Clinician: Referring Jood Retana: Treating Lucita Montoya/Extender:Robson, Suzi Roots, Tacy Learn in Treatment: 0 Learning Preferences/Education Level/Primary Language Learning Preference: Explanation Highest Education Level: High School Preferred Language: English Cognitive Barrier Language Barrier: No Translator Needed: No Memory Deficit: No Emotional Barrier: No Cultural/Religious Beliefs Affecting Medical Care: No Physical Barrier Impaired Vision: No Impaired Hearing: No Decreased Hand dexterity: No Knowledge/Comprehension Knowledge Level: High Comprehension Level: High Ability to understand written High instructions: Ability to understand verbal High instructions: Motivation Anxiety Level: Calm Cooperation: Cooperative Education Importance: Acknowledges Need Interest in Health Problems: Asks Questions Perception: Coherent Willingness to Engage in Self- High Management Activities: Readiness to Engage in Self- High Management Activities: Electronic Signature(s) Signed: 02/07/2020 6:20:13 PM By: Kela Millin Entered By: Kela Millin on 02/07/2020 09:53:20 -------------------------------------------------------------------------------- Fall Risk Assessment Details Patient Name: Date of Service: [name]. 02/07/2020 9:00 AM Medical Record [id number] Patient Account Number: [id number] Date of Birth/Sex: Treating RN: 1951-08-27 (69 y.o. Marvis Repress Primary Care Saphia Vanderford: Aura Dials Other Clinician: Referring Domino Holten: Treating  Madgeline Rayo/Extender:Robson, Suzi Roots, Tacy Learn in Treatment: 0 Fall Risk Assessment Items Have you had 2 or more falls in the last 12 monthso 0 No Have you had any fall that resulted in injury in the last 12 monthso 0 No FALLS RISK SCREEN History of falling - immediate or within 3 months 0 No Secondary diagnosis (Do you have 2 or more medical diagnoseso) 15 Yes Ambulatory aid None/bed rest/wheelchair/nurse 0 Yes Crutches/cane/walker 0 No Furniture 0 No Intravenous therapy Access/Saline/Heparin Lock 0 No Weak (short steps with or without shuffle, stooped but able to lift head 0 No while walking, may seek support from furniture) Impaired (short steps with shuffle, may have difficulty arising from chair, 0 No head down,  impaired balance) Mental Status Oriented to own ability 0 Yes Overestimates or forgets limitations 0 No Risk Level: Low Risk Score: 15 Electronic Signature(s) Signed: 02/07/2020 6:20:13 PM By: Kela Millin Entered By: Kela Millin on 02/07/2020 09:53:35 -------------------------------------------------------------------------------- Foot Assessment Details Patient Name: Date of Service: [name]. 02/07/2020 9:00 AM Medical Record [id number] Patient Account Number: [id number] Date of Birth/Sex: Treating RN: 07-May-1951 (69 y.o. Marvis Repress Primary Care Jamill Wetmore: Aura Dials Other Clinician: Referring Jesilyn Easom: Treating Shontia Gillooly/Extender:Robson, Suzi Roots, Tacy Learn in Treatment: 0 Foot Assessment Items Site Locations + = Sensation present, - = Sensation absent, C = Callus, U = Ulcer R = Redness, W = Warmth, M = Maceration, PU = Pre-ulcerative lesion F = Fissure, S = Swelling, D = Dryness Assessment Right: Left: Other Deformity: No No Prior Foot Ulcer: No No Prior Amputation: No No Charcot Joint: No No Ambulatory Status: Ambulatory Without Help Gait: Steady Electronic Signature(s) Signed: 02/07/2020  6:20:13 PM By: Kela Millin Entered By: Kela Millin on 02/07/2020 09:54:08 -------------------------------------------------------------------------------- Nutrition Risk Screening Details Patient Name: Date of Service: [name] 02/07/2020 9:00 AM Medical Record [id number] Patient Account Number: [id number] Date of Birth/Sex: Treating RN: 10/08/1951 (69 y.o. Marvis Repress Primary Care Courtne Lighty: Aura Dials Other Clinician: Referring Louiza Moor: Treating Keyonna Comunale/Extender:Robson, Suzi Roots, Tacy Learn in Treatment: 0 Height (in): 69 Weight (lbs): 280 Body Mass Index (BMI): 41.3 Nutrition Risk Screening Items Score Screening NUTRITION RISK SCREEN: I have an illness or condition that made me change the kind and/or 0 No amount of food I eat I eat fewer than two meals per day 0 No I eat few fruits and vegetables, or milk products 0 No I have three or more drinks of beer, liquor or wine almost every day 0 No I have tooth or mouth problems that make it hard for me to eat 0 No I don't always have enough money to buy the food I need 0 No I eat alone most of the time 0 No I take three or more different prescribed or over-the-counter drugs a day 1 Yes 0 No Without wanting to, I have lost or gained 10 pounds in the last six months I am not always physically able to shop, cook and/or feed myself 0 No Nutrition Protocols Good Risk Protocol Provide education on Moderate Risk Protocol 0 nutrition High Risk Proctocol Risk Level: Good Risk Score: 1 Electronic Signature(s) Signed: 02/07/2020 6:20:13 PM By: Kela Millin Entered By: Kela Millin on 02/07/2020 09:53:58

## 2020-02-09 NOTE — Progress Notes (Signed)
ARRIE, ZUERCHER ([id number]) Visit Report for 02/07/2020 Chief Complaint Document Details Patient Name: Date of Service: [name], [name]. 02/07/2020 9:00 AM Medical Record [id number] Patient Account Number: [id number] Date of Birth/Sex: Treating RN: 1951/04/02 (69 y.o. Male) Deon Pilling Primary Care Provider: Aura Dials Other Clinician: Referring Provider: Treating Provider/Extender:Robson, Suzi Roots, Tacy Learn in Treatment: 0 Information Obtained from: Patient Chief Complaint 02/07/2020; patient is here for review of his left lower leg post hospitalization. 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 02/07/2020 9:00 AM Medical Record [id number] Patient Account Number: [id number] Date of Birth/Sex: Treating RN: 1951/08/21 (69 y.o. Male) Deon Pilling Primary Care Provider: Aura Dials Other Clinician: Referring Provider: Treating Provider/Extender:Robson, Suzi Roots, Tacy Learn in Treatment: 0 History of Present Illness HPI Description: [place] History of Present Illness (HPI) 09/27/2019 on evaluation today patient appears to be doing poorly in regard to his bilateral lower extremities he does have bilateral lower extremity lymphedema. With that being said he also has a history of diabetes type 2, hypertension, congestive heart failure, and apparently has chronic venous insufficiency which has led to the lymphedema. Currently he also shows signs of an infection of the left lower extremity which does have me concerned at this point as well. He tells me that he is not really having any significant pain but has had a lot of trouble with his legs in  general. He does have a compression stocking he is wearing on the right lower extremity he was wrapped with an Unna boot on the left lower extremity. The patient tells me that overall he has been doing with this for several weeks and home health has been initiated. He has not been on any antibiotics yet and he tells me that the home health nurse was not sure that it was infected based on what I see today I am concerned that this likely is infected at this point. Fortunately however there does not appear to be any evidence of systemic infection which is good news. The Unna boot does seem to be helping control the edema although he may benefit from a stronger compression wrap I think that at this point without having the ABIs completed the Unna boot is where I would stand. He is supposed to be having an arterial study with ABI as ordered by his cardiologist but he states he may have missed that appointment and has not called to reschedule he needs to contact them to get this rescheduled as soon as possible I explained to him. He tells me that he is draining a lot as far as the left lower extremity is concerned and that is something that he feels like is not lasting very long as far as the wraps are concerned when they put him on they seem to get wet extremely quickly. 10/04/2019 on evaluation today patient appears to be doing better compared to last week's evaluation. Fortunately there is no signs of active infection at this time. He has been tolerating the dressing changes without complication. He did undergo arterial studies yesterday and has an appointment tomorrow with the vascular specialist. With that being said his ABI on the left was 1.25 on the right 1.19 with a TBI of 0.61 on the left and 0.69 on the right. There was stated to be no evidence of obvious  significant arterial disease and the patient had pretty much triphasic blood flow throughout. Overall I am very pleased with how things seem  to be progressing. We will see if the vascular doctor says anything different tomorrow but I even at this point I feel like he would do well with a stronger compression wrap to try to get some the fluid out of his leg at this point. The good news is his drainage has slowed down considerably I do believe the antibiotics have been beneficial for him. 10/18/2019 on evaluation today patient appears to be doing poorly in regard to his left lower extremity. He still having a lot of drainage and weeping from his leg this appears to be much more erythematous and in fact I am concerned that the erythema may be spreading up his leg as well. He has not experienced any fevers, chills, nausea, vomiting, or diarrhea up to this point. I explained to him that I really feel like based on what I am seeing he may need to potentially go to the hospital for evaluation and treatment potentially even with IV antibiotics depending on what they see. With that being said he tells me that he is not really able to do that due to the fact that he is the primary caregiver for his wife who is legally blind. He states he is not good to be able to have anything to help as far as caring for her if he were to go into the hospital. Nonetheless he is unsure what to do in this regard. I really think that he needs faster treatment. For that reason I am going to likely try to get in touch with the infectious disease office to see if I can get him in sooner than [place]. 10/25/2019 on evaluation today patient appears to be doing a little better in regard to his weeping and edema compared to the last time I saw him. The dorsal surface of his foot is a little bit drier compared to what was this is good news. He did see Dr. Drucilla Schmidt on 10/22/2019. Dr. Drucilla Schmidt did feel that potentially this could be secondary to some cellulitis and he recommended subsequently placing the patient on Zyvox which the patient feels like has helped as well. There  does not appear to be any signs of active infection at this time which is good news systemically. I am very pleased that Dr. Drucilla Schmidt was able to see him so quickly I do appreciate this greatly. With that being said he also did recommend an MRI for the patient to evaluate for any deeper signs of infection. That is actually scheduled for this coming Tuesday. He also has a follow-up appoint with Dr. Drucilla Schmidt on Tuesday. The last thing Dr. Drucilla Schmidt did mention was the possibility of more aggressive diuresis. The patient is on torsemide. With that being said this is managed by his cardiologist. I think we may want to get in touch with her clinic and see if possibly this is something that could be increased for a short amount of time depending on their recommendations to see if we can get some additional fluid off of him which in turn would likely help tremendously with his weeping and edema. He is in agreement with this plan if his cardiologist is in agreement. 11/01/2019 on evaluation today patient unfortunately notes that he did not get his MRI done which was ordered by Dr. Drucilla Schmidt. Apparently when he went in it was 6:30 in the morning and they told  him that he was there to get an MRI of his toes. With that being said the MRI was ordered of his ankle and foot and so long story short the patient said that he was not getting MRI of his toes and left. Dr. Drucilla Schmidt was alerted and again it does not appear that at all that was what he ordered that he did order the ankle and foot but nonetheless that is beside the point at this time. The patient still has considerable edema noted at this time the nurse that came out last after just put a Kerlix and Coban wrap on the patient she did not even properly wrap him. Subsequently they have also discharged him being that they state he is not skilled at this point. Therefore he is going to have to be seen here in the office only. Fortunately there is no signs of systemic  infection he still has a lot of erythema around the ankle and lower extremity region again there are any specific open wounds just general weeping and evidence of erythema which Dr. Drucilla Schmidt is questioning whether or not this is even infection or if it is just more secondary to the patient's lymphedema. He has had venous reflux studies which were negative for any abnormal findings unfortunately that is also not good to be an answer for [place] here. I have recommended the patient today contact his primary care provider to see if they can increase his fluid pills and I also think we need to increase his compression wrap to a 4-layer compression wrap to try to more effectively manage his edema. 11/08/2019 on evaluation today patient appears unfortunately to still be doing poorly. I did speak with his primary care provider office last week they were supposed to call him to advise what should be done as far as his fluid pills were concerned. With that being said they did not contact him as far as the patient tells me. I am not really sure exactly what is going on as far as that is concerned. Nonetheless I really think that the patient needs to be admitted to the hospital for treatment likely as I explained to him he is going to require IV Lasix to get fluid off and to be honest also think he may need IV antibiotics. I have made all the outpatient referrals I can to try to get things under control he has been on oral antibiotics I really just have not seen any significant improvement he was even on linezolid. That was prescribed By infectious disease. Overall I am very concerned about the fact that this is not getting better. 11/22/2019 on evaluation today patient unfortunately is not doing very well with regard to his lower extremity edema. The left lower extremity is significantly swollen and still significantly erythematous. He has bright green drainage. He did just see Dr. Drucilla Schmidt on 11/19/2019 and Dr. Drucilla Schmidt  did not feel like that this was infected. Subsequently he states that he feels like the issue is more 1 of not being at maximal diuresis and recommended the patient see his cardiologist to ensure that this was achieved. Subsequently Dr. Drucilla Schmidt states this is a issue with venous stasis dermatitis which again I completely understand that that may definitely be the case but unfortunately we've not been able to get things under control even with maximum compression at a 4 layer compression wrap. The patient is still having significant issues. Either way I do think the patient is getting need to follow-up with  his cardiologist to see what we can do Dr. Drucilla Schmidt did make mention that the patient had a flareup of infection that Keflex 500 mg 4 times a day or Augmentin twice a day would be good options for nonpurulent cellulitis. The patient was again supposed to have gone to the hospital on the weekend following Christmas after he got things settled with family as far as getting someone to take care of his wife. With that being said he tells me that when it came around to that time he just didn't think about it. When asked him about why the drainage from his leg and the issues he is having that and remind him he told me "well it really hasn't changed". 11/29/2019 on evaluation today patient presents for follow-up concerning his left lower extremity edema and weeping. He did see his cardiologist at my request after I discussed with him the situation as well. This is Binnie Kand who is a family Designer, jewellery that he saw. Subsequently the patient was recommended to start metolazone 5 mg daily for the next 5 days. They also recommended that really he needed essentially bed rest with elevation of his legs over the next week. With that being said the patient states that he is unable to even get in bed due to the fact that he has back trouble and has to sleep in his recliner. He never really gets in the bed at  all. This is probably part of the issue coupled with his heart not functioning quite as well as what we would like to see and then subsequently he also has the issue of being overweight which contributes to this as well. Overall were still trying to figure out how to best manage this to get his tweaking under control and prevent anything from worsening. Again Dr. Drucilla Schmidt really did not feel like there was any evidence of infectious processes going on at this point. 12/06/2019 on evaluation today patient's leg actually appears to be doing better with regard to edema control I am actually pleased in this regard. He also seems to be improving as far as drainage is concerned from the leg this is much better than what it was in the past. Fortunately there is no signs of active infection which is good news and overall the patient states he is actually happier with where things stand at this point. 12/13/2019 on evaluation today patient unfortunately is very dry with cracked skin noted at this point. Fortunately there is no signs of active infection at this time. With that being said I think he is really needs some moisturizing lotion something really good to counter loosen up the dry cracked skin I think even if 40% urea cream would be ideal at this point. I discussed this with him today. In fact even look this up on his phone so that he can order it if he needs to although he may be able to find it locally I am just not sure. I told him to check with his pharmacy first. 12/20/2019 on evaluation today patient unfortunately continues to have issues with swelling of his left lower extremity. We did use Tubigrip last week to try to give him a chance to be able to take this off and take a shower and then reapply it. With that being said he just cut it off and then did not have anything to reapply he did that on Friday after we saw him on Thursday and had nothing on in the interim  for almost an entire week since.  Obviously that was not the direction that was given to him and when I asked him about it he stated that he just could not bend over to get off and he does not have anyone to help him. With that being said I do not think this is can be possible to have him take this off to take a shower simply due to the fact that again were not going to be able to keep the edema under control and he can get this off and back on it hurts his back way too much. 12/27/2019 on evaluation today patient actually appears to be mostly dry in regard to his lower extremity wounds in general. Fortunately there is no signs of active infection at this time. No fever chills noted. I really do not see anything open at this point which is good news. He does have a lot of dry skin that is flaking off. With that being said I think that likely I recommend wrapping him 1 more week and we can utilize the objective coating layer from the Profore wraps in order to prevent anything from sticking to the wound bed. After that I am hopeful we will be able to get him into the compression socks that he has he supposed to bring 1 of those with him next week he also is supposed to switch out and start using the new ones on his right leg as when he has is completely worn out. 01/10/2020 upon evaluation today patient appears to be doing poorly at this point in regard to his lower extremities on the left. He still continues to have significant weeping he actually took the wrap off 2 days ago because he stated "we were planning to take it off today anyway". He did not however put any compression stockings on but obviously was not part of the plan. Still he continues to have significant swelling I really do not know that this is going to make a significant improvement and less were able to keep him well compressed all the time and again also get his edema under good control. He was supposed to go back to see his cardiologist although I do not think that  is actually been an appointment that he has made at this point that I can find anyway. He also is still taking the double dose of his Lasix but again I am not sure that that is doing the job here. 01/17/2020 upon evaluation today patient appears to be doing quite well with regard to his leg all things considered. This is better than last week. With that being said he still has significant edema and weeping unfortunately this is going to continue to be an issue. There is no signs of active infection. He tells me that he got a new prescription for torsemide. To be honest I was not quite sure that he had been prescribed that yet I thought he had been prescribed Lasix but either way I think there may be a little bit of confusion here. I discussed this with the patient today as well. I also discussed with the patient the fact that I do believe he is going to likely require additional follow-up with his cardiologist. I suggested he give them a call to make an appointment. He told me that he called them last week but he did not make an appointment. I am not exactly sure what he called in for or what he was looking for  out of that. Nonetheless there does not appear to be any signs of active infection at this time. No fevers, chills, nausea, vomiting, or diarrhea. 01/24/2020 upon evaluation today patient appears to be doing poorly in regard to his left lower extremity. He has bright green almost teal colored drainage which is very consistent with likely Pseudomonas. He also has a Pseudomonas-like smell to the drainage noted at this point. Fortunately there is no signs of active systemic infection at this time. No fevers, chills, nausea, vomiting, or diarrhea. With that being said I explained to the patient that this can change quickly that he can definitely become septic as a result of what we are seeing here if he does not treat this appropriately. With that being said I really think he needs to be in the ER  as soon as possible in fact I really want him to go today he tells me there is no way he is likely to be able to get that set up in time. He takes care of his wife who is legally blind and I completely understand the constraints there nonetheless he is going to take care of himself if he is good to be around to take care of her. 01/31/2020 upon evaluation today patient unfortunately appears to be doing poorly yet again. There is no signs of systemic infection yet although that is a big concern for me at this point to be honest. No fevers, chills, nausea, vomiting, or diarrhea. With regard to the oral antibiotics at this point the patient does have a prescription for trazodone which I was under the impression he was taking. He states that he is not really sure that he is taking that on a regular basis or not. To be honest is on his list he tells me that in his pill bottle he has set pills he takes every day and he is really unsure of exactly what he takes. He needs to go back and have a look at that and try to figure things out. Either way if we were to place him on something like Cipro or Levaquin with him still being on trazodone that can prolong the QT interval which can be also dangerous. I am not willing to do that. The patient really needs to go to the hospital for in my opinion IV antibiotic therapy. He was positive for Pseudomonas and when he came in today his dressing was completely saturated with blue-green drainage consistent with Pseudomonas as well that I saw even before I saw the results of the culture and already knew what we were dealing with. ADMISSION 02/07/2020 This is a 49-year-old man followed for a prolonged period of time at our sister clinic in [place]. When he was seen last week he was felt to have cellulitis with a greenish drainage and he was sent to the hospital. He has lymphedema chronic venous insufficiency and was being treated for a wound on his left lower leg. He is  also a type II diabetic although the patient states he is not taking anything for his diabetes and does not monitor his blood sugar. He was admitted to [facility]. A discharge summary is not yet available but he was just discharged yesterday. In the hospital he was felt to have chronic or acute on chronic systolic heart failure. He was treated with IV Lasix. Felt his dry weight was 128 kg. He was aggressively changed to torsemide 40 twice daily. With regards to his lower extremity edema he  was treated with an Haematologist. He was sent here because this was felt to be closer to his actual home which is in [facility]. I do not believe he received antibiotics. Other issues included paroxysmal atrial fibrillation and nonsustained V. tach with PVCs. He came in the clinic today with the Unna boots looking to be a mass. This was according to our intake nurse Electronic Signature(s) Signed: 02/07/2020 6:03:33 PM By: Linton Ham MD Entered By: Linton Ham on 02/07/2020 13:03:14 -------------------------------------------------------------------------------- Physical Exam Details Patient Name: Date of Service: [name]. 02/07/2020 9:00 AM Medical Record [id number] Patient Account Number: [id number] Date of Birth/Sex: Treating RN: May 10, 1951 (69 y.o. Male) Deon Pilling Primary Care Provider: Aura Dials Other Clinician: Referring Provider: Treating Provider/Extender:Robson, Suzi Roots, Tacy Learn in Treatment: 0 Constitutional Sitting or standing Blood Pressure is within target range for patient.. Pulse regular and within target range for patient.Marland Kitchen Respirations regular, non-labored and within target range.. Temperature is normal and within the target range for the patient.Marland Kitchen Appears in no distress. Respiratory work of breathing is normal. Bilateral breath sounds are clear and equal in all lobes with no wheezes, rales or rhonchi.. Cardiovascular Slight  irregularity. No murmurs his jugular venous pressure was not elevated no toxic edema. Pedal pulses are palpable on the left. Very poorly controlled edema in the left leg. Nonpitting. Integumentary (Hair, Skin) Marked erythema in the left lower leg to the mid calf but no palpable tenderness. Above this there was some erythema as well with satellite lesions.Marland Kitchen Psychiatric appears at normal baseline. Notes Wound exam the patient had some drainage in the left leg posteriorly but in general this looked a lot better than the last picture I was able to bring up. He still has some loss of surface epithelium in the lower calf almost circumferentially but there is really no open wound beyond this. Is pedal pulses are palpable Electronic Signature(s) Signed: 02/07/2020 6:03:33 PM By: Linton Ham MD Entered By: Linton Ham on 02/07/2020 13:05:39 -------------------------------------------------------------------------------- Physician Orders Details Patient Name: Date of Service: [name]. 02/07/2020 9:00 AM Medical Record [id number] Patient Account Number: [id number] Date of Birth/Sex: Treating RN: [birth year] (69 y.o. Male) Deon Pilling Primary Care Provider: Aura Dials Other Clinician: Referring Provider: Treating Provider/Extender:Robson, Suzi Roots, Tacy Learn in Treatment: 0 Verbal / Phone Orders: No Diagnosis Coding Follow-up Appointments Return Appointment in 1 week. Dressing Change Frequency Do not change entire dressing for one week. Skin Barriers/Peri-Wound Care TCA Cream or Ointment - mixed with ketoconazole liberally entire left leg. Wound Cleansing May shower and wash wound with soap and water. - use a cast protector. Primary Wound Dressing Wound #1 Left,Circumferential Lower Leg Calcium Alginate with Silver - weeping areas as well. Secondary Dressing Wound #1 Left,Circumferential Lower Leg ABD pad Zetuvit or Kerramax Edema Control 4 layer  compression: Left lower extremity Patient to wear own compression stockings - apply to right leg in the morning and remove at night. Avoid standing for long periods of time Elevate legs to the level of the heart or above for 30 minutes daily and/or when sitting, a frequency of: - throughout the day. Electronic Signature(s) Signed: 02/07/2020 6:03:33 PM By: Linton Ham MD Signed: 02/07/2020 6:22:19 PM By: Deon Pilling Entered By: Deon Pilling on 02/07/2020 11:08:25 -------------------------------------------------------------------------------- Problem List Details Patient Name: Date of Service: [name]. 02/07/2020 9:00 AM Medical Record [id number] Patient Account Number: [id number] Date of Birth/Sex: Treating RN: 12/04/1950 (68 y.o. Male) Deon Pilling Primary Care Provider: Aura Dials Other Clinician: Referring  Provider: Treating Provider/Extender:Robson, Suzi Roots, Tacy Learn in Treatment: 0 Active Problems ICD-10 Evaluated Encounter Code Description Active Date Today Diagnosis L97.221 Non-pressure chronic ulcer of left calf limited to 02/07/2020 No Yes breakdown of skin I89.0 Lymphedema, not elsewhere classified 02/07/2020 No Yes I87.322 Chronic venous hypertension (idiopathic) with 02/07/2020 No Yes inflammation of left lower extremity M62.94 Acute systolic (congestive) heart failure 02/07/2020 No Yes Inactive Problems Resolved Problems Electronic Signature(s) Signed: 02/07/2020 6:03:33 PM By: Linton Ham MD Entered By: Linton Ham on 02/07/2020 12:57:07 -------------------------------------------------------------------------------- Progress Note Details Patient Name: Date of Service: [name]. 02/07/2020 9:00 AM Medical Record [id number] Patient Account Number: [id number] Date of Birth/Sex: Treating RN: 10-16-1951 (69 y.o. Male) Deon Pilling Primary Care Provider: Aura Dials Other Clinician: Referring Provider:  Treating Provider/Extender:Robson, Suzi Roots, Tacy Learn in Treatment: 0 Subjective Chief Complaint Information obtained from Patient 02/07/2020; patient is here for review of his left lower leg post hospitalization. Previously at our sister clinic in [place] for a prolonged period of time History of Present Illness (HPI) [place] History of Present Illness (HPI) 09/27/2019 on evaluation today patient appears to be doing poorly in regard to his bilateral lower extremities he does have bilateral lower extremity lymphedema. With that being said he also has a history of diabetes type 2, hypertension, congestive heart failure, and apparently has chronic venous insufficiency which has led to the lymphedema. Currently he also shows signs of an infection of the left lower extremity which does have me concerned at this point as well. He tells me that he is not really having any significant pain but has had a lot of trouble with his legs in general. He does have a compression stocking he is wearing on the right lower extremity he was wrapped with an Unna boot on the left lower extremity. The patient tells me that overall he has been doing with this for several weeks and home health has been initiated. He has not been on any antibiotics yet and he tells me that the home health nurse was not sure that it was infected based on what I see today I am concerned that this likely is infected at this point. Fortunately however there does not appear to be any evidence of systemic infection which is good news. The Unna boot does seem to be helping control the edema although he may benefit from a stronger compression wrap I think that at this point without having the ABIs completed the Unna boot is where I would stand. He is supposed to be having an arterial study with ABI as ordered by his cardiologist but he states he may have missed that appointment and has not called to reschedule he needs to  contact them to get this rescheduled as soon as possible I explained to him. He tells me that he is draining a lot as far as the left lower extremity is concerned and that is something that he feels like is not lasting very long as far as the wraps are concerned when they put him on they seem to get wet extremely quickly. 10/04/2019 on evaluation today patient appears to be doing better compared to last week's evaluation. Fortunately there is no signs of active infection at this time. He has been tolerating the dressing changes without complication. He did undergo arterial studies yesterday and has an appointment tomorrow with the vascular specialist. With that being said his ABI on the left was 1.25 on the right 1.19 with a TBI of 0.61 on the left  and 0.69 on the right. There was stated to be no evidence of obvious significant arterial disease and the patient had pretty much triphasic blood flow throughout. Overall I am very pleased with how things seem to be progressing. We will see if the vascular doctor says anything different tomorrow but I even at this point I feel like he would do well with a stronger compression wrap to try to get some the fluid out of his leg at this point. The good news is his drainage has slowed down considerably I do believe the antibiotics have been beneficial for him. 10/18/2019 on evaluation today patient appears to be doing poorly in regard to his left lower extremity. He still having a lot of drainage and weeping from his leg this appears to be much more erythematous and in fact I am concerned that the erythema may be spreading up his leg as well. He has not experienced any fevers, chills, nausea, vomiting, or diarrhea up to this point. I explained to him that I really feel like based on what I am seeing he may need to potentially go to the hospital for evaluation and treatment potentially even with IV antibiotics depending on what they see. With that being said he  tells me that he is not really able to do that due to the fact that he is the primary caregiver for his wife who is legally blind. He states he is not good to be able to have anything to help as far as caring for her if he were to go into the hospital. Nonetheless he is unsure what to do in this regard. I really think that he needs faster treatment. For that reason I am going to likely try to get in touch with the infectious disease office to see if I can get him in sooner than [place]. 10/25/2019 on evaluation today patient appears to be doing a little better in regard to his weeping and edema compared to the last time I saw him. The dorsal surface of his foot is a little bit drier compared to what was this is good news. He did see Dr. Drucilla Schmidt on 10/22/2019. Dr. Drucilla Schmidt did feel that potentially this could be secondary to some cellulitis and he recommended subsequently placing the patient on Zyvox which the patient feels like has helped as well. There does not appear to be any signs of active infection at this time which is good news systemically. I am very pleased that Dr. Drucilla Schmidt was able to see him so quickly I do appreciate this greatly. With that being said he also did recommend an MRI for the patient to evaluate for any deeper signs of infection. That is actually scheduled for this coming Tuesday. He also has a follow-up appoint with Dr. Drucilla Schmidt on Tuesday. The last thing Dr. Drucilla Schmidt did mention was the possibility of more aggressive diuresis. The patient is on torsemide. With that being said this is managed by his cardiologist. I think we may want to get in touch with her clinic and see if possibly this is something that could be increased for a short amount of time depending on their recommendations to see if we can get some additional fluid off of him which in turn would likely help tremendously with his weeping and edema. He is in agreement with this plan if his cardiologist is in  agreement. 11/01/2019 on evaluation today patient unfortunately notes that he did not get his MRI done which was ordered by Dr. Drucilla Schmidt.  Apparently when he went in it was 6:30 in the morning and they told him that he was there to get an MRI of his toes. With that being said the MRI was ordered of his ankle and foot and so long story short the patient said that he was not getting MRI of his toes and left. Dr. Drucilla Schmidt was alerted and again it does not appear that at all that was what he ordered that he did order the ankle and foot but nonetheless that is beside the point at this time. The patient still has considerable edema noted at this time the nurse that came out last after just put a Kerlix and Coban wrap on the patient she did not even properly wrap him. Subsequently they have also discharged him being that they state he is not skilled at this point. Therefore he is going to have to be seen here in the office only. Fortunately there is no signs of systemic infection he still has a lot of erythema around the ankle and lower extremity region again there are any specific open wounds just general weeping and evidence of erythema which Dr. Drucilla Schmidt is questioning whether or not this is even infection or if it is just more secondary to the patient's lymphedema. He has had venous reflux studies which were negative for any abnormal findings unfortunately that is also not good to be an answer for [place] here. I have recommended the patient today contact his primary care provider to see if they can increase his fluid pills and I also think we need to increase his compression wrap to a 4-layer compression wrap to try to more effectively manage his edema. 11/08/2019 on evaluation today patient appears unfortunately to still be doing poorly. I did speak with his primary care provider office last week they were supposed to call him to advise what should be done as far as his fluid pills were concerned. With that  being said they did not contact him as far as the patient tells me. I am not really sure exactly what is going on as far as that is concerned. Nonetheless I really think that the patient needs to be admitted to the hospital for treatment likely as I explained to him he is going to require IV Lasix to get fluid off and to be honest also think he may need IV antibiotics. I have made all the outpatient referrals I can to try to get things under control he has been on oral antibiotics I really just have not seen any significant improvement he was even on linezolid. That was prescribed By infectious disease. Overall I am very concerned about the fact that this is not getting better. 11/22/2019 on evaluation today patient unfortunately is not doing very well with regard to his lower extremity edema. The left lower extremity is significantly swollen and still significantly erythematous. He has bright green drainage. He did just see Dr. Drucilla Schmidt on 11/19/2019 and Dr. Drucilla Schmidt did not feel like that this was infected. Subsequently he states that he feels like the issue is more 1 of not being at maximal diuresis and recommended the patient see his cardiologist to ensure that this was achieved. Subsequently Dr. Drucilla Schmidt states this is a issue with venous stasis dermatitis which again I completely understand that that may definitely be the case but unfortunately we've not been able to get things under control even with maximum compression at a 4 layer compression wrap. The patient is still having significant issues.  Either way I do think the patient is getting need to follow-up with his cardiologist to see what we can do Dr. Drucilla Schmidt did make mention that the patient had a flareup of infection that Keflex 500 mg 4 times a day or Augmentin twice a day would be good options for nonpurulent cellulitis. The patient was again supposed to have gone to the hospital on the weekend following Christmas after he got things settled  with family as far as getting someone to take care of his wife. With that being said he tells me that when it came around to that time he just didn't think about it. When asked him about why the drainage from his leg and the issues he is having that and remind him he told me "well it really hasn't changed". 11/29/2019 on evaluation today patient presents for follow-up concerning his left lower extremity edema and weeping. He did see his cardiologist at my request after I discussed with him the situation as well. This is Binnie Kand who is a family Designer, jewellery that he saw. Subsequently the patient was recommended to start metolazone 5 mg daily for the next 5 days. They also recommended that really he needed essentially bed rest with elevation of his legs over the next week. With that being said the patient states that he is unable to even get in bed due to the fact that he has back trouble and has to sleep in his recliner. He never really gets in the bed at all. This is probably part of the issue coupled with his heart not functioning quite as well as what we would like to see and then subsequently he also has the issue of being overweight which contributes to this as well. Overall were still trying to figure out how to best manage this to get his tweaking under control and prevent anything from worsening. Again Dr. Drucilla Schmidt really did not feel like there was any evidence of infectious processes going on at this point. 12/06/2019 on evaluation today patient's leg actually appears to be doing better with regard to edema control I am actually pleased in this regard. He also seems to be improving as far as drainage is concerned from the leg this is much better than what it was in the past. Fortunately there is no signs of active infection which is good news and overall the patient states he is actually happier with where things stand at this point. 12/13/2019 on evaluation today patient  unfortunately is very dry with cracked skin noted at this point. Fortunately there is no signs of active infection at this time. With that being said I think he is really needs some moisturizing lotion something really good to counter loosen up the dry cracked skin I think even if 40% urea cream would be ideal at this point. I discussed this with him today. In fact even look this up on his phone so that he can order it if he needs to although he may be able to find it locally I am just not sure. I told him to check with his pharmacy first. 12/20/2019 on evaluation today patient unfortunately continues to have issues with swelling of his left lower extremity. We did use Tubigrip last week to try to give him a chance to be able to take this off and take a shower and then reapply it. With that being said he just cut it off and then did not have anything to reapply he did that on  Friday after we saw him on Thursday and had nothing on in the interim for almost an entire week since. Obviously that was not the direction that was given to him and when I asked him about it he stated that he just could not bend over to get off and he does not have anyone to help him. With that being said I do not think this is can be possible to have him take this off to take a shower simply due to the fact that again were not going to be able to keep the edema under control and he can get this off and back on it hurts his back way too much. 12/27/2019 on evaluation today patient actually appears to be mostly dry in regard to his lower extremity wounds in general. Fortunately there is no signs of active infection at this time. No fever chills noted. I really do not see anything open at this point which is good news. He does have a lot of dry skin that is flaking off. With that being said I think that likely I recommend wrapping him 1 more week and we can utilize the objective coating layer from the Profore wraps in order to  prevent anything from sticking to the wound bed. After that I am hopeful we will be able to get him into the compression socks that he has he supposed to bring 1 of those with him next week he also is supposed to switch out and start using the new ones on his right leg as when he has is completely worn out. 01/10/2020 upon evaluation today patient appears to be doing poorly at this point in regard to his lower extremities on the left. He still continues to have significant weeping he actually took the wrap off 2 days ago because he stated "we were planning to take it off today anyway". He did not however put any compression stockings on but obviously was not part of the plan. Still he continues to have significant swelling I really do not know that this is going to make a significant improvement and less were able to keep him well compressed all the time and again also get his edema under good control. He was supposed to go back to see his cardiologist although I do not think that is actually been an appointment that he has made at this point that I can find anyway. He also is still taking the double dose of his Lasix but again I am not sure that that is doing the job here. 01/17/2020 upon evaluation today patient appears to be doing quite well with regard to his leg all things considered. This is better than last week. With that being said he still has significant edema and weeping unfortunately this is going to continue to be an issue. There is no signs of active infection. He tells me that he got a new prescription for torsemide. To be honest I was not quite sure that he had been prescribed that yet I thought he had been prescribed Lasix but either way I think there may be a little bit of confusion here. I discussed this with the patient today as well. I also discussed with the patient the fact that I do believe he is going to likely require additional follow-up with his cardiologist. I suggested he  give them a call to make an appointment. He told me that he called them last week but he did not make an appointment. I am  not exactly sure what he called in for or what he was looking for out of that. Nonetheless there does not appear to be any signs of active infection at this time. No fevers, chills, nausea, vomiting, or diarrhea. 01/24/2020 upon evaluation today patient appears to be doing poorly in regard to his left lower extremity. He has bright green almost teal colored drainage which is very consistent with likely Pseudomonas. He also has a Pseudomonas-like smell to the drainage noted at this point. Fortunately there is no signs of active systemic infection at this time. No fevers, chills, nausea, vomiting, or diarrhea. With that being said I explained to the patient that this can change quickly that he can definitely become septic as a result of what we are seeing here if he does not treat this appropriately. With that being said I really think he needs to be in the ER as soon as possible in fact I really want him to go today he tells me there is no way he is likely to be able to get that set up in time. He takes care of his wife who is legally blind and I completely understand the constraints there nonetheless he is going to take care of himself if he is good to be around to take care of her. 01/31/2020 upon evaluation today patient unfortunately appears to be doing poorly yet again. There is no signs of systemic infection yet although that is a big concern for me at this point to be honest. No fevers, chills, nausea, vomiting, or diarrhea. With regard to the oral antibiotics at this point the patient does have a prescription for trazodone which I was under the impression he was taking. He states that he is not really sure that he is taking that on a regular basis or not. To be honest is on his list he tells me that in his pill bottle he has set pills he takes every day and he is really  unsure of exactly what he takes. He needs to go back and have a look at that and try to figure things out. Either way if we were to place him on something like Cipro or Levaquin with him still being on trazodone that can prolong the QT interval which can be also dangerous. I am not willing to do that. The patient really needs to go to the hospital for in my opinion IV antibiotic therapy. He was positive for Pseudomonas and when he came in today his dressing was completely saturated with blue-green drainage consistent with Pseudomonas as well that I saw even before I saw the results of the culture and already knew what we were dealing with. ADMISSION 02/07/2020 This is a 1-year-old man followed for a prolonged period of time at our sister clinic in [place]. When he was seen last week he was felt to have cellulitis with a greenish drainage and he was sent to the hospital. He has lymphedema chronic venous insufficiency and was being treated for a wound on his left lower leg. He is also a type II diabetic although the patient states he is not taking anything for his diabetes and does not monitor his blood sugar. He was admitted to [facility]. A discharge summary is not yet available but he was just discharged yesterday. In the hospital he was felt to have chronic or acute on chronic systolic heart failure. He was treated with IV Lasix. Felt his dry weight was 128 kg. He was aggressively changed  to torsemide 40 twice daily. With regards to his lower extremity edema he was treated with an Haematologist. He was sent here because this was felt to be closer to his actual home which is in [facility] [place]. I do not believe he received antibiotics. Other issues included paroxysmal atrial fibrillation and nonsustained V. tach with PVCs. He came in the clinic today with the Unna boots looking to be a mass. This was according to our intake nurse Patient History Information obtained from  Patient. Allergies codeine, sulfur Family History Cancer - Paternal Grandparents, Diabetes - Mother, No family history of Heart Disease, Hereditary Spherocytosis, Hypertension, Kidney Disease, Lung Disease, Seizures, Stroke, Thyroid Problems, Tuberculosis. Social History Former smoker, Marital Status - Married, Alcohol Use - Never, Drug Use - No History, Caffeine Use - Daily. Medical History Eyes Denies history of Cataracts, Glaucoma, Optic Neuritis Ear/Nose/Mouth/Throat Denies history of Chronic sinus problems/congestion, Middle ear problems Hematologic/Lymphatic Denies history of Anemia, Hemophilia, Human Immunodeficiency Virus, Lymphedema, Sickle Cell Disease Respiratory Denies history of Aspiration, Asthma, Chronic Obstructive Pulmonary Disease (COPD), Pneumothorax, Sleep Apnea, Tuberculosis Cardiovascular Patient has history of Arrhythmia - afib, Congestive Heart Failure, Hypertension Gastrointestinal Denies history of Cirrhosis , Colitis, Crohnoos, Hepatitis A, Hepatitis B, Hepatitis C Endocrine Patient has history of Type II Diabetes Genitourinary Denies history of End Stage Renal Disease Immunological Denies history of Lupus Erythematosus, Raynaudoos, Scleroderma Integumentary (Skin) Denies history of History of Burn Musculoskeletal Denies history of Gout, Rheumatoid Arthritis, Osteoarthritis, Osteomyelitis Neurologic Denies history of Dementia, Neuropathy, Quadriplegia, Paraplegia, Seizure Disorder Oncologic Denies history of Received Chemotherapy, Received Radiation Psychiatric Denies history of Anorexia/bulimia, Confinement Anxiety Patient is treated with Oral Agents. Blood sugar is not tested. Medical And Surgical History Notes Integumentary (Skin) venous stasis dermatitis lymphedema Review of Systems (ROS) Constitutional Symptoms (General Health) Denies complaints or symptoms of Fatigue, Fever, Chills, Marked Weight Change. Eyes Denies complaints or  symptoms of Dry Eyes, Vision Changes, Glasses / Contacts. Ear/Nose/Mouth/Throat Denies complaints or symptoms of Chronic sinus problems or rhinitis. Respiratory Denies complaints or symptoms of Chronic or frequent coughs, Shortness of Breath. Cardiovascular Denies complaints or symptoms of Chest pain. Gastrointestinal Denies complaints or symptoms of Frequent diarrhea, Nausea, Vomiting. Endocrine Denies complaints or symptoms of Heat/cold intolerance. Genitourinary Denies complaints or symptoms of Frequent urination. Integumentary (Skin) Complains or has symptoms of Wounds, bilateral lower legs Musculoskeletal Denies complaints or symptoms of Muscle Pain, Muscle Weakness. Neurologic Denies complaints or symptoms of Numbness/parasthesias. Psychiatric Denies complaints or symptoms of Claustrophobia, Suicidal. Objective Constitutional Sitting or standing Blood Pressure is within target range for patient.. Pulse regular and within target range for patient.Marland Kitchen Respirations regular, non-labored and within target range.. Temperature is normal and within the target range for the patient.Marland Kitchen Appears in no distress. Vitals Time Taken: 9:40 AM, Height: 69 in, Source: Stated, Weight: 280 lbs, Source: Stated, BMI: 41.3, Temperature: 97.9 F, Pulse: 43 bpm, Respiratory Rate: 20 breaths/min, Blood Pressure: 139/72 mmHg. General Notes: patient does not check his CBG's Respiratory work of breathing is normal. Bilateral breath sounds are clear and equal in all lobes with no wheezes, rales or rhonchi.. Cardiovascular Slight irregularity. No murmurs his jugular venous pressure was not elevated no toxic edema. Pedal pulses are palpable on the left. Very poorly controlled edema in the left leg. Nonpitting. Psychiatric appears at normal baseline. General Notes: Wound exam the patient had some drainage in the left leg posteriorly but in general this looked a lot better than the last picture I was able to  bring up.  He still has some loss of surface epithelium in the lower calf almost circumferentially but there is really no open wound beyond this. Is pedal pulses are palpable Integumentary (Hair, Skin) Marked erythema in the left lower leg to the mid calf but no palpable tenderness. Above this there was some erythema as well with satellite lesions.. Wound #1 status is Open. Original cause of wound was Gradually Appeared. The wound is located on the Left,Circumferential Lower Leg. The wound measures 13cm length x 32cm width x 0.1cm depth; 326.726cm^2 area and 32.673cm^3 volume. There is no tunneling or undermining noted. There is a large amount of purulent drainage noted. The wound margin is distinct with the outline attached to the wound base. There is large (67-100%) red granulation within the wound bed. There is no necrotic tissue within the wound bed. Assessment Active Problems ICD-10 Non-pressure chronic ulcer of left calf limited to breakdown of skin Lymphedema, not elsewhere classified Chronic venous hypertension (idiopathic) with inflammation of left lower extremity Acute systolic (congestive) heart failure Procedures Wound #1 Pre-procedure diagnosis of Wound #1 is a Lymphedema located on the Left,Circumferential Lower Leg . There was a Four Layer Compression Therapy Procedure by Baruch Gouty, RN. Post procedure Diagnosis Wound #1: Same as Pre-Procedure Plan Follow-up Appointments: Return Appointment in 1 week. Dressing Change Frequency: Do not change entire dressing for one week. Skin Barriers/Peri-Wound Care: TCA Cream or Ointment - mixed with ketoconazole liberally entire left leg. Wound Cleansing: May shower and wash wound with soap and water. - use a cast protector. Primary Wound Dressing: Wound #1 Left,Circumferential Lower Leg: Calcium Alginate with Silver - weeping areas as well. Secondary Dressing: Wound #1 Left,Circumferential Lower Leg: ABD pad Zetuvit or  Kerramax Edema Control: 4 layer compression: Left lower extremity Patient to wear own compression stockings - apply to right leg in the morning and remove at night. Avoid standing for long periods of time Elevate legs to the level of the heart or above for 30 minutes daily and/or when sitting, a frequency of: - throughout the day. 1. TCA mixed with ketoconazole to the entire leg. I actually think he has severe stasis dermatitis to about the one third of the way up from the ankle and above this looks like a candidal rash with satellite area. 2. We put him in 4 layer compression rather than the Unna boot 3. He had new onset by my read of the discharge summary systolic heart failure versus a past echo in September 2020 his ejection fraction had gone down to 40% although there was apparently a lot of PVCs and one would wonder whether the all of this was some form of tachycardic cardiomyopathy. He was discharged on torsemide and metoprolol. I could not find any evidence of heart failure today. 4. The patient has a appointment with [name] his primary doctor on Friday morning. I have urged him to at least take the torsemide once a day and to get the metoprolol filled. He has a very poor understanding of his medical issues. Unfortunately they were even more substantial. Not sure why he was not put on an ACE inhibitor or an ARB. I did not push the issue of the anticoagulation but will leave that up to [name] to discuss. He has filled none of his discharged medication 5. He is going to go back to the clinic in [place] after he saw how long sometimes you have to wait in the clinic here. He has an appointment next Thursday morning Electronic Signature(s)  Signed: 02/07/2020 6:03:33 PM By: Linton Ham MD Entered By: Linton Ham on 02/07/2020 13:08:54 -------------------------------------------------------------------------------- HxROS Details Patient Name: Date of Service: [name]. 02/07/2020 9:00 AM Medical Record [id number] Patient Account Number: [id number] Date of Birth/Sex: Treating RN: [birth year] (69 y.o. Male) Kela Millin Primary Care Provider: Aura Dials Other Clinician: Referring Provider: Treating Provider/Extender:Robson, Suzi Roots, Tacy Learn in Treatment: 0 Information Obtained From Patient Constitutional Symptoms (General Health) Complaints and Symptoms: Negative for: Fatigue; Fever; Chills; Marked Weight Change Eyes Complaints and Symptoms: Negative for: Dry Eyes; Vision Changes; Glasses / Contacts Medical History: Negative for: Cataracts; Glaucoma; Optic Neuritis Ear/Nose/Mouth/Throat Complaints and Symptoms: Negative for: Chronic sinus problems or rhinitis Medical History: Negative for: Chronic sinus problems/congestion; Middle ear problems Respiratory Complaints and Symptoms: Negative for: Chronic or frequent coughs; Shortness of Breath Medical History: Negative for: Aspiration; Asthma; Chronic Obstructive Pulmonary Disease (COPD); Pneumothorax; Sleep Apnea; Tuberculosis Cardiovascular Complaints and Symptoms: Negative for: Chest pain Medical History: Positive for: Arrhythmia - afib; Congestive Heart Failure; Hypertension Gastrointestinal Complaints and Symptoms: Negative for: Frequent diarrhea; Nausea; Vomiting Medical History: Negative for: Cirrhosis ; Colitis; Crohns; Hepatitis A; Hepatitis B; Hepatitis C Endocrine Complaints and Symptoms: Negative for: Heat/cold intolerance Medical History: Positive for: Type II Diabetes Time with diabetes: 5 years Treated with: Oral agents Blood sugar tested every day: No Genitourinary Complaints and Symptoms: Negative for: Frequent urination Medical History: Negative for: End Stage Renal Disease Integumentary (Skin) Complaints and Symptoms: Positive for: Wounds Review of System Notes: bilateral lower legs Medical History: Negative for: History of  Burn Past Medical History Notes: venous stasis dermatitis lymphedema Musculoskeletal Complaints and Symptoms: Negative for: Muscle Pain; Muscle Weakness Medical History: Negative for: Gout; Rheumatoid Arthritis; Osteoarthritis; Osteomyelitis Neurologic Complaints and Symptoms: Negative for: Numbness/parasthesias Medical History: Negative for: Dementia; Neuropathy; Quadriplegia; Paraplegia; Seizure Disorder Psychiatric Complaints and Symptoms: Negative for: Claustrophobia; Suicidal Medical History: Negative for: Anorexia/bulimia; Confinement Anxiety Hematologic/Lymphatic Medical History: Negative for: Anemia; Hemophilia; Human Immunodeficiency Virus; Lymphedema; Sickle Cell Disease Immunological Medical History: Negative for: Lupus Erythematosus; Raynauds; Scleroderma Oncologic Medical History: Negative for: Received Chemotherapy; Received Radiation Immunizations Pneumococcal Vaccine: Received Pneumococcal Vaccination: No Implantable Devices None Family and Social History Cancer: Yes - Paternal Grandparents; Diabetes: Yes - Mother; Heart Disease: No; Hereditary Spherocytosis: No; Hypertension: No; Kidney Disease: No; Lung Disease: No; Seizures: No; Stroke: No; Thyroid Problems: No; Tuberculosis: No; Former smoker; Marital Status - Married; Alcohol Use: Never; Drug Use: No History; Caffeine Use: Daily; Financial Concerns: No; Food, Clothing or Shelter Needs: No; Support System Lacking: No; Transportation Concerns: No Electronic Signature(s) Signed: 02/07/2020 6:03:33 PM By: Linton Ham MD Signed: 02/07/2020 6:20:13 PM By: Kela Millin Entered By: Kela Millin on 02/07/2020 10:19:30 -------------------------------------------------------------------------------- SuperBill Details Patient Name: Date of Service: [name] 02/07/2020 Medical Record [id number] Patient Account Number: [id number] Date of Birth/Sex: Treating RN: 1951-06-16 (69 y.o. Male)  Deon Pilling Primary Care Provider: Aura Dials Other Clinician: Referring Provider: Treating Provider/Extender:Robson, Suzi Roots, Tacy Learn in Treatment: 0 Diagnosis Coding ICD-10 Codes Code Description [phone] Non-pressure chronic ulcer of left calf limited to breakdown of skin I89.0 Lymphedema, not elsewhere classified I87.322 Chronic venous hypertension (idiopathic) with inflammation of left lower extremity I34.74 Acute systolic (congestive) heart failure Facility Procedures The patient participates with Medicare or their insurance follows the Medicare Facility Guidelines: CPT4 Code Description Modifier Quantity [id number] - WOUND CARE VISIT-LEV 3 EST PT 1 [id number] (Facility Use Only) 29581LT - APPLY [place]  LT LEG 1 Physician Procedures CPT4 Code Description: 1884166 06301 -  WC PHYS LEVEL 4 - EST PT ICD-10 Diagnosis Description L97.221 Non-pressure chronic ulcer of left calf limited to bre I89.0 Lymphedema, not elsewhere classified I87.322 Chronic venous hypertension (idiopathic) with  inflamma extremity X90.24 Acute systolic (congestive) heart failure Modifier: akdown of skin tion of left lowe Quantity: 1 r Electronic Signature(s) Signed: 02/07/2020 6:22:19 PM By: Deon Pilling Signed: 02/09/2020 8:02:09 AM By: Linton Ham MD Previous Signature: 02/07/2020 6:03:33 PM Version By: Linton Ham MD Entered By: Deon Pilling on 02/07/2020 18:18:48

## 2020-02-12 NOTE — Discharge Summary (Signed)
Name: [name]. MRN: [id number] DOB: Sep 12, 1951 69 y.o. PCP: Aura Dials, MD  Date of Admission: 01/31/2020 11:44 AM Date of Discharge: 02/06/2020 Attending Physician: Lalla Brothers, MD FACP  Discharge Diagnosis: 1.  Acute on chronic heart failure with preserved ejection fraction  Discharge Medications: Allergies as of 02/06/2020      Reactions   Codeine Swelling   Water retention   Sulfur Other (See Comments)   Not sure of reaction a young child      Medication List    STOP taking these medications   metolazone 5 MG tablet Commonly known as: ZAROXOLYN   metoprolol succinate 50 MG 24 hr tablet Commonly known as: TOPROL-XL     TAKE these medications   apixaban 5 MG Tabs tablet Commonly known as: ELIQUIS Take 1 tablet (5 mg total) by mouth 2 (two) times daily.   finasteride 5 MG tablet Commonly known as: PROSCAR Take 5 mg by mouth daily.   fluticasone 50 MCG/ACT nasal spray Commonly known as: FLONASE Place 2 sprays into both nostrils daily as needed for allergies or rhinitis.   GABAPENTIN PO Take 800 mg by mouth daily as needed (pain).   glipiZIDE 5 MG 24 hr tablet Commonly known as: GLUCOTROL XL Take 10 mg by mouth daily with breakfast.   metoprolol tartrate 50 MG tablet Commonly known as: LOPRESSOR Take 1 tablet (50 mg total) by mouth 2 (two) times daily for 30 doses.   potassium chloride SA 20 MEQ tablet Commonly known as: KLOR-CON Take 1 tablet by mouth daily   torsemide 20 MG tablet Commonly known as: DEMADEX Take 2 tablets (40 mg total) by mouth 2 (two) times daily. What changed: when to take this   Vitamin D3 125 MCG (5000 UT) Tabs Take 1 tablet by mouth daily. Reported on 04/29/2016       Disposition and follow-up:   [name]. was discharged from [facility] in Good condition.  At the hospital follow up visit please address:  1.  Follow-up: 1) heart failure-patient will need titration of his  diuretics to continue to keep fluid off. 2) lymphedema-I set patient up with a new wound care appointment in order for the patient to continue to get an Unna boot dressing changes for his chronic lymphedema.  2.  Labs / imaging needed at time of follow-up: BMP  3.  Pending labs/ test needing follow-up: None  Follow-up Appointments: Follow-up Information    [facility]             . Go on 02/07/2020.   Why: at 9:00 am.  Contact information: [address] [place] [phone]       Aura Dials, MD. Daphane Shepherd on 02/13/2020.   Specialty: Family Medicine Why: at 9:30 am for your hospital follow up appoitment.  Contact information: [address] [place] [address] [phone]           Hospital Course by problem list: 1.  Acute on chronic heart failure-patient presented to [facility] ED on 03/18 with signs symptoms concerning for hypervolemia including significant lower extremity edema.  Echocardiogram showed a new mildly reduced ejection fraction of 40 to 45% with a dilated IVC consistent with acute on chronic heart failure exacerbation.  Patient was started on IV furosemide with roughly 8 kg of fluid removed during his hospitalization.  Patient did have significant decrease in his lower extremity  edema but continued to have edema particularly his left lower extremity.  Throughout this hospitalization the patient declined any overt symptoms of CHF.  On his home dose of torsemide at 40 mg twice daily.  His new dry weight was 128 kg.  2.  Lower extremity edema-patient presented with significant lower extremity edema particularly in his left lower extremity with erythema and significant serous manage.  He was originally seen at a wound care center in [place] for his left lower extremity edema and chronic wound.  He is being having Unna boots placed at the clinic with improvement of his erythema and edema.   Patient was sent to [facility] from the clinic with concerns for left lower extremity cellulitis.  Patient's skin changes were consistent with inflammation secondary to stasis.  Patient was diuresed in the hospital and Unna boots were placed with improvement of his left lower extremity edema.  Patient was discharged with outpatient follow-up with wound care center in [place] as this is closer to his residence and would be easier for him to follow-up with.  3.  Atrial fibrillation/PVCs-patient presented in sinus rhythm but had a single episode of atrial fibrillation due to patient's risk factors and a CHA2DS2-VASc score was elevated he was started on Eliquis.  He was discharged on Eliquis and set up with a outpatient follow-up with his primary care doctor for further evaluation and management of his anticoagulation secondary to his atrial fibrillation.  Discharge Vitals:   BP 122/72   Pulse 70   Temp 97.7 F (36.5 C) (Oral)   Resp 19   Ht 5\' 9"  (1.753 m)   Wt 128 kg   SpO2 96%   BMI 41.67 kg/m   Pertinent Labs, Studies, and Procedures:  CBC Latest Ref Rng & Units 02/02/2020 02/01/2020 01/31/2020  WBC 4.0 - 10.5 K/uL 9.7 11.1(H) 9.9  Hemoglobin 13.0 - 17.0 g/dL 11.8(L) 12.0(L) 12.5(L)  Hematocrit 39.0 - 52.0 % 36.9(L) 37.9(L) 40.2  Platelets 150 - 400 K/uL 263 258 246   CMP Latest Ref Rng & Units 02/06/2020 02/05/2020 02/04/2020  Glucose 70 - 99 mg/dL 139(H) 194(H) 135(H)  BUN 8 - 23 mg/dL 29(H) 32(H) 28(H)  Creatinine 0.61 - 1.24 mg/dL 1.38(H) 1.44(H) 1.51(H)  Sodium 135 - 145 mmol/L 138 136 140  Potassium 3.5 - 5.1 mmol/L 3.7 3.6 4.1  Chloride 98 - 111 mmol/L 99 100 103  CO2 22 - 32 mmol/L 29 26 26   Calcium 8.9 - 10.3 mg/dL 8.1(L) 7.9(L) 7.9(L)  Total Protein 6.5 - 8.1 g/dL - 7.3 -  Total Bilirubin 0.3 - 1.2 mg/dL - 0.7 -  Alkaline Phos 38 - 126 U/L - 72 -  AST 15 - 41 U/L - 39 -  ALT 0 - 44 U/L - 28 -   Echo: 1. Very difficult study with poor windows. EF 40-45% but difficult to   assess due to frequent PVCs. Would recommend to re-check echo after PVCs  treated/resolve.  2. Left ventricular ejection fraction, by estimation, is 40 to 45%. The  left ventricle has mildly decreased function. The left ventricle  demonstrates global hypokinesis. There is moderate concentric left  ventricular hypertrophy. Left ventricular  diastolic function could not be evaluated. Left ventricular diastolic  function could not be evaluated.  3. Right ventricular systolic function is normal. The right ventricular  size is normal.  4. Left atrial size was mild to moderately dilated.  5. The mitral valve is grossly normal. Trivial mitral valve  regurgitation. No evidence  of mitral stenosis.  6. The aortic valve is tricuspid. Aortic valve regurgitation is not  visualized. No aortic stenosis is present.  7. The inferior vena cava is dilated in size with >50% respiratory  variability, suggesting right atrial pressure of 8 mmHg.   Discharge Instructions: Discharge Instructions    Diet - low sodium heart healthy   Complete by: As directed    Discharge instructions   Complete by: As directed    You were hospitalized for worsening heart failure with left leg swelling. Thank you for allowing [place] to be part of your care.   We arranged for you to follow up at:  1) [facility]- 03/25 at 9:00 AM 2) Primary Care Physician, hospital follow-up appointment with [name] - 03/31 at 9:30 AM  Please note these changes made to your medications:   Please START taking:  1.  Apixaban 5 mg twice daily  Please STOP taking:  1.  Metolazone 5 mg daily  Please notice CHANGES to your medications: 1.  Metoprolol 50 mg twice daily 2.  Torsemide 40 mg twice daily  Please make sure to to take all of your medications as prescribed and to follow-up with your outpatient appointments as highlighted on your discharge paperwork.  Please call our clinic if you have any  questions or concerns, we may be able to help and keep you from a long and expensive emergency room wait. Our clinic and after hours phone number is [phone], the best time to call is Monday through Friday 9 am to 4 pm but there is always someone available 24/7 if you have an emergency. If you need medication refills please notify your pharmacy one week in advance and they will send [place] a request.   Increase activity slowly   Complete by: As directed       Signed: Marianna Payment, MD 02/12/2020, 6:38 PM   Pager: [phone]

## 2020-02-13 ENCOUNTER — Ambulatory Visit: Payer: Medicare Other

## 2020-02-18 ENCOUNTER — Encounter (HOSPITAL_BASED_OUTPATIENT_CLINIC_OR_DEPARTMENT_OTHER): Payer: Medicare Other | Admitting: Internal Medicine

## 2020-02-27 NOTE — Telephone Encounter (Signed)
Appt was canceled

## 2020-02-28 ENCOUNTER — Encounter (HOSPITAL_BASED_OUTPATIENT_CLINIC_OR_DEPARTMENT_OTHER): Payer: Medicare Other | Attending: Internal Medicine | Admitting: Internal Medicine

## 2020-02-28 ENCOUNTER — Other Ambulatory Visit: Payer: Self-pay

## 2020-02-28 DIAGNOSIS — I87332 Chronic venous hypertension (idiopathic) with ulcer and inflammation of left lower extremity: Secondary | ICD-10-CM | POA: Insufficient documentation

## 2020-02-28 DIAGNOSIS — I890 Lymphedema, not elsewhere classified: Secondary | ICD-10-CM | POA: Insufficient documentation

## 2020-02-28 DIAGNOSIS — I509 Heart failure, unspecified: Secondary | ICD-10-CM | POA: Diagnosis not present

## 2020-02-28 DIAGNOSIS — I5021 Acute systolic (congestive) heart failure: Secondary | ICD-10-CM | POA: Diagnosis not present

## 2020-02-28 DIAGNOSIS — E119 Type 2 diabetes mellitus without complications: Secondary | ICD-10-CM | POA: Insufficient documentation

## 2020-02-28 DIAGNOSIS — I110 Hypertensive heart disease with heart failure: Secondary | ICD-10-CM | POA: Diagnosis not present

## 2020-02-28 DIAGNOSIS — L97221 Non-pressure chronic ulcer of left calf limited to breakdown of skin: Secondary | ICD-10-CM | POA: Diagnosis not present

## 2020-03-01 NOTE — Progress Notes (Signed)
SAAJAN, WILLMON ([id number]) Visit Report for 02/28/2020 HPI Details Patient Name: Date of Service: [name], [name] 02/28/2020 12:30 PM Medical Record [id number] Patient Account Number: [id number] Date of Birth/Sex: Treating RN: 27-Jan-1951 (69 y.o. M) Primary Care Provider: Aura Dials Other Clinician: Referring Provider: Treating Provider/Extender:Grisel Blumenstock, Suzi Roots, Tacy Learn in Treatment: 3 History of Present Illness HPI Description: [place] History of Present Illness (HPI) 09/27/2019 on evaluation today patient appears to be doing poorly in regard to his bilateral lower extremities he does have bilateral lower extremity lymphedema. With that being said he also has a history of diabetes type 2, hypertension, congestive heart failure, and apparently has chronic venous insufficiency which has led to the lymphedema. Currently he also shows signs of an infection of the left lower extremity which does have me concerned at this point as well. He tells me that he is not really having any significant pain but has had a lot of trouble with his legs in general. He does have a compression stocking he is wearing on the right lower extremity he was wrapped with an Unna boot on the left lower extremity. The patient tells me that overall he has been doing with this for several weeks and home health has been initiated. He has not been on any antibiotics yet and he tells me that the home health nurse was not sure that it was infected based on what I see today I am concerned that this likely is infected at this point. Fortunately however there does not appear to be any evidence of systemic infection which is good news. The Unna boot does seem to be helping control the edema although he may benefit from a stronger compression wrap I think that at this point without having the ABIs completed the Unna boot is where I would stand. He is supposed to be having an arterial study with ABI as  ordered by his cardiologist but he states he may have missed that appointment and has not called to reschedule he needs to contact them to get this rescheduled as soon as possible I explained to him. He tells me that he is draining a lot as far as the left lower extremity is concerned and that is something that he feels like is not lasting very long as far as the wraps are concerned when they put him on they seem to get wet extremely quickly. 10/04/2019 on evaluation today patient appears to be doing better compared to last week's evaluation. Fortunately there is no signs of active infection at this time. He has been tolerating the dressing changes without complication. He did undergo arterial studies yesterday and has an appointment tomorrow with the vascular specialist. With that being said his ABI on the left was 1.25 on the right 1.19 with a TBI of 0.61 on the left and 0.69 on the right. There was stated to be no evidence of obvious significant arterial disease and the patient had pretty much triphasic blood flow throughout. Overall I am very pleased with how things seem to be progressing. We will see if the vascular doctor says anything different tomorrow but I even at this point I feel like he would do well with a stronger compression wrap to try to get some the fluid out of his leg at this point. The good news is his drainage has slowed down considerably I do believe the antibiotics have been beneficial for him. 10/18/2019 on evaluation today patient appears to be doing poorly in regard to his left  lower extremity. He still having a lot of drainage and weeping from his leg this appears to be much more erythematous and in fact I am concerned that the erythema may be spreading up his leg as well. He has not experienced any fevers, chills, nausea, vomiting, or diarrhea up to this point. I explained to him that I really feel like based on what I am seeing he may need to potentially go to the  hospital for evaluation and treatment potentially even with IV antibiotics depending on what they see. With that being said he tells me that he is not really able to do that due to the fact that he is the primary caregiver for his wife who is legally blind. He states he is not good to be able to have anything to help as far as caring for her if he were to go into the hospital. Nonetheless he is unsure what to do in this regard. I really think that he needs faster treatment. For that reason I am going to likely try to get in touch with the infectious disease office to see if I can get him in sooner than [place]. 10/25/2019 on evaluation today patient appears to be doing a little better in regard to his weeping and edema compared to the last time I saw him. The dorsal surface of his foot is a little bit drier compared to what was this is good news. He did see Dr. Drucilla Schmidt on 10/22/2019. Dr. Drucilla Schmidt did feel that potentially this could be secondary to some cellulitis and he recommended subsequently placing the patient on Zyvox which the patient feels like has helped as well. There does not appear to be any signs of active infection at this time which is good news systemically. I am very pleased that Dr. Drucilla Schmidt was able to see him so quickly I do appreciate this greatly. With that being said he also did recommend an MRI for the patient to evaluate for any deeper signs of infection. That is actually scheduled for this coming Tuesday. He also has a follow-up appoint with Dr. Drucilla Schmidt on Tuesday. The last thing Dr. Drucilla Schmidt did mention was the possibility of more aggressive diuresis. The patient is on torsemide. With that being said this is managed by his cardiologist. I think we may want to get in touch with her clinic and see if possibly this is something that could be increased for a short amount of time depending on their recommendations to see if we can get some additional fluid off of him which in turn  would likely help tremendously with his weeping and edema. He is in agreement with this plan if his cardiologist is in agreement. 11/01/2019 on evaluation today patient unfortunately notes that he did not get his MRI done which was ordered by Dr. Drucilla Schmidt. Apparently when he went in it was 6:30 in the morning and they told him that he was there to get an MRI of his toes. With that being said the MRI was ordered of his ankle and foot and so long story short the patient said that he was not getting MRI of his toes and left. Dr. Drucilla Schmidt was alerted and again it does not appear that at all that was what he ordered that he did order the ankle and foot but nonetheless that is beside the point at this time. The patient still has considerable edema noted at this time the nurse that came out last after just put a Kerlix and Coban  wrap on the patient she did not even properly wrap him. Subsequently they have also discharged him being that they state he is not skilled at this point. Therefore he is going to have to be seen here in the office only. Fortunately there is no signs of systemic infection he still has a lot of erythema around the ankle and lower extremity region again there are any specific open wounds just general weeping and evidence of erythema which Dr. Drucilla Schmidt is questioning whether or not this is even infection or if it is just more secondary to the patient's lymphedema. He has had venous reflux studies which were negative for any abnormal findings unfortunately that is also not good to be an answer for [place] here. I have recommended the patient today contact his primary care provider to see if they can increase his fluid pills and I also think we need to increase his compression wrap to a 4-layer compression wrap to try to more effectively manage his edema. 11/08/2019 on evaluation today patient appears unfortunately to still be doing poorly. I did speak with his primary care provider office last  week they were supposed to call him to advise what should be done as far as his fluid pills were concerned. With that being said they did not contact him as far as the patient tells me. I am not really sure exactly what is going on as far as that is concerned. Nonetheless I really think that the patient needs to be admitted to the hospital for treatment likely as I explained to him he is going to require IV Lasix to get fluid off and to be honest also think he may need IV antibiotics. I have made all the outpatient referrals I can to try to get things under control he has been on oral antibiotics I really just have not seen any significant improvement he was even on linezolid. That was prescribed By infectious disease. Overall I am very concerned about the fact that this is not getting better. 11/22/2019 on evaluation today patient unfortunately is not doing very well with regard to his lower extremity edema. The left lower extremity is significantly swollen and still significantly erythematous. He has bright green drainage. He did just see Dr. Drucilla Schmidt on 11/19/2019 and Dr. Drucilla Schmidt did not feel like that this was infected. Subsequently he states that he feels like the issue is more 1 of not being at maximal diuresis and recommended the patient see his cardiologist to ensure that this was achieved. Subsequently Dr. Drucilla Schmidt states this is a issue with venous stasis dermatitis which again I completely understand that that may definitely be the case but unfortunately we've not been able to get things under control even with maximum compression at a 4 layer compression wrap. The patient is still having significant issues. Either way I do think the patient is getting need to follow-up with his cardiologist to see what we can do Dr. Drucilla Schmidt did make mention that the patient had a flareup of infection that Keflex 500 mg 4 times a day or Augmentin twice a day would be good options for nonpurulent cellulitis. The  patient was again supposed to have gone to the hospital on the weekend following Christmas after he got things settled with family as far as getting someone to take care of his wife. With that being said he tells me that when it came around to that time he just didn't think about it. When asked him about why the drainage  from his leg and the issues he is having that and remind him he told me "well it really hasn't changed". 11/29/2019 on evaluation today patient presents for follow-up concerning his left lower extremity edema and weeping. He did see his cardiologist at my request after I discussed with him the situation as well. This is Binnie Kand who is a family Designer, jewellery that he saw. Subsequently the patient was recommended to start metolazone 5 mg daily for the next 5 days. They also recommended that really he needed essentially bed rest with elevation of his legs over the next week. With that being said the patient states that he is unable to even get in bed due to the fact that he has back trouble and has to sleep in his recliner. He never really gets in the bed at all. This is probably part of the issue coupled with his heart not functioning quite as well as what we would like to see and then subsequently he also has the issue of being overweight which contributes to this as well. Overall were still trying to figure out how to best manage this to get his tweaking under control and prevent anything from worsening. Again Dr. Drucilla Schmidt really did not feel like there was any evidence of infectious processes going on at this point. 12/06/2019 on evaluation today patient's leg actually appears to be doing better with regard to edema control I am actually pleased in this regard. He also seems to be improving as far as drainage is concerned from the leg this is much better than what it was in the past. Fortunately there is no signs of active infection which is good news and overall the patient  states he is actually happier with where things stand at this point. 12/13/2019 on evaluation today patient unfortunately is very dry with cracked skin noted at this point. Fortunately there is no signs of active infection at this time. With that being said I think he is really needs some moisturizing lotion something really good to counter loosen up the dry cracked skin I think even if 40% urea cream would be ideal at this point. I discussed this with him today. In fact even look this up on his phone so that he can order it if he needs to although he may be able to find it locally I am just not sure. I told him to check with his pharmacy first. 12/20/2019 on evaluation today patient unfortunately continues to have issues with swelling of his left lower extremity. We did use Tubigrip last week to try to give him a chance to be able to take this off and take a shower and then reapply it. With that being said he just cut it off and then did not have anything to reapply he did that on Friday after we saw him on Thursday and had nothing on in the interim for almost an entire week since. Obviously that was not the direction that was given to him and when I asked him about it he stated that he just could not bend over to get off and he does not have anyone to help him. With that being said I do not think this is can be possible to have him take this off to take a shower simply due to the fact that again were not going to be able to keep the edema under control and he can get this off and back on it hurts his back way too much.  12/27/2019 on evaluation today patient actually appears to be mostly dry in regard to his lower extremity wounds in general. Fortunately there is no signs of active infection at this time. No fever chills noted. I really do not see anything open at this point which is good news. He does have a lot of dry skin that is flaking off. With that being said I think that likely I recommend  wrapping him 1 more week and we can utilize the objective coating layer from the Profore wraps in order to prevent anything from sticking to the wound bed. After that I am hopeful we will be able to get him into the compression socks that he has he supposed to bring 1 of those with him next week he also is supposed to switch out and start using the new ones on his right leg as when he has is completely worn out. 01/10/2020 upon evaluation today patient appears to be doing poorly at this point in regard to his lower extremities on the left. He still continues to have significant weeping he actually took the wrap off 2 days ago because he stated "we were planning to take it off today anyway". He did not however put any compression stockings on but obviously was not part of the plan. Still he continues to have significant swelling I really do not know that this is going to make a significant improvement and less were able to keep him well compressed all the time and again also get his edema under good control. He was supposed to go back to see his cardiologist although I do not think that is actually been an appointment that he has made at this point that I can find anyway. He also is still taking the double dose of his Lasix but again I am not sure that that is doing the job here. 01/17/2020 upon evaluation today patient appears to be doing quite well with regard to his leg all things considered. This is better than last week. With that being said he still has significant edema and weeping unfortunately this is going to continue to be an issue. There is no signs of active infection. He tells me that he got a new prescription for torsemide. To be honest I was not quite sure that he had been prescribed that yet I thought he had been prescribed Lasix but either way I think there may be a little bit of confusion here. I discussed this with the patient today as well. I also discussed with the patient the fact  that I do believe he is going to likely require additional follow-up with his cardiologist. I suggested he give them a call to make an appointment. He told me that he called them last week but he did not make an appointment. I am not exactly sure what he called in for or what he was looking for out of that. Nonetheless there does not appear to be any signs of active infection at this time. No fevers, chills, nausea, vomiting, or diarrhea. 01/24/2020 upon evaluation today patient appears to be doing poorly in regard to his left lower extremity. He has bright green almost teal colored drainage which is very consistent with likely Pseudomonas. He also has a Pseudomonas-like smell to the drainage noted at this point. Fortunately there is no signs of active systemic infection at this time. No fevers, chills, nausea, vomiting, or diarrhea. With that being said I explained to the patient that this can change quickly  that he can definitely become septic as a result of what we are seeing here if he does not treat this appropriately. With that being said I really think he needs to be in the ER as soon as possible in fact I really want him to go today he tells me there is no way he is likely to be able to get that set up in time. He takes care of his wife who is legally blind and I completely understand the constraints there nonetheless he is going to take care of himself if he is good to be around to take care of her. 01/31/2020 upon evaluation today patient unfortunately appears to be doing poorly yet again. There is no signs of systemic infection yet although that is a big concern for me at this point to be honest. No fevers, chills, nausea, vomiting, or diarrhea. With regard to the oral antibiotics at this point the patient does have a prescription for trazodone which I was under the impression he was taking. He states that he is not really sure that he is taking that on a regular basis or not. To be honest  is on his list he tells me that in his pill bottle he has set pills he takes every day and he is really unsure of exactly what he takes. He needs to go back and have a look at that and try to figure things out. Either way if we were to place him on something like Cipro or Levaquin with him still being on trazodone that can prolong the QT interval which can be also dangerous. I am not willing to do that. The patient really needs to go to the hospital for in my opinion IV antibiotic therapy. He was positive for Pseudomonas and when he came in today his dressing was completely saturated with blue-green drainage consistent with Pseudomonas as well that I saw even before I saw the results of the culture and already knew what we were dealing with. ADMISSION 02/07/2020 This is a 69 year old man followed for a prolonged period of time at our sister clinic in [place]. When he was seen last week he was felt to have cellulitis with a greenish drainage and he was sent to the hospital. He has lymphedema chronic venous insufficiency and was being treated for a wound on his left lower leg. He is also a type II diabetic although the patient states he is not taking anything for his diabetes and does not monitor his blood sugar. He was admitted to [facility]. A discharge summary is not yet available but he was just discharged yesterday. In the hospital he was felt to have chronic or acute on chronic systolic heart failure. He was treated with IV Lasix. Felt his dry weight was 128 kg. He was aggressively changed to torsemide 40 twice daily. With regards to his lower extremity edema he was treated with an Haematologist. He was sent here because this was felt to be closer to his actual home which is in [facility]. I do not believe he received antibiotics. Other issues included paroxysmal atrial fibrillation and nonsustained V. tach with PVCs. He came in the clinic today with the Unna boots looking to be a  mass. This was according to our intake nurse 02/28/2020; the patient I saw once 3 weeks ago. He had severe edema likely secondary to chronic venous insufficiency and lymphedema with superficial wounds on his posterior lower leg but marked stasis dermatitis. We put TCA  and ketoconazole in his skin silver alginate and put him on compression. All that is the last we have seen of him. He tells me he took the wrap off 2 days ago i.e. it stayed on for 2-1/2 weeks. He also told us that he was in hospital however we could see no such hospitalization. Electronic Signature(s) Signed: 03/01/2020 6:55:41 AM By: Linton Ham MD Entered By: Linton Ham on 02/28/2020 13:30:19 -------------------------------------------------------------------------------- Physical Exam Details Patient Name: Date of Service: [name]. 02/28/2020 12:30 PM Medical Record [id number] Patient Account Number: [id number] Date of Birth/Sex: Treating RN: October 18, 1951 (69 y.o. M) Primary Care Provider: Aura Dials Other Clinician: Referring Provider: Treating Provider/Extender:Stacyann Mcconaughy, Suzi Roots, Tacy Learn in Treatment: 3 Constitutional Patient is hypotensive. However he appears well. Pulse regular and within target range for patient.Marland Kitchen Respirations regular, non-labored and within target range.. Temperature is normal and within the target range for the patient.Marland Kitchen Appears in no distress. Cardiovascular Pedal pulses are palpable on the left. Nonpitting edema.. Integumentary (Hair, Skin) Erythema but no tenderness. Psychiatric Somewhat forgetful. Notes Wound exam; no drainage. The patient has loss of surface epithelium medially and anteriorly. Significant lower extremity edema anteriorly with dry scaly debris over the wound surface. Electronic Signature(s) Signed: 03/01/2020 6:55:41 AM By: Linton Ham MD Entered By: Linton Ham on 02/28/2020  13:32:56 -------------------------------------------------------------------------------- Physician Orders Details Patient Name: Date of Service: [name]. 02/28/2020 12:30 PM Medical Record [id number] Patient Account Number: [id number] Date of Birth/Sex: Treating RN: Mar 27, 1951 (69 y.o. M) Primary Care Provider: Aura Dials Other Clinician: Referring Provider: Treating Provider/Extender:Aniketh Huberty, Suzi Roots, Tacy Learn in Treatment: 3 Verbal / Phone Orders: No Diagnosis Coding ICD-10 Coding Code Description [phone] Non-pressure chronic ulcer of left calf limited to breakdown of skin I89.0 Lymphedema, not elsewhere classified I87.322 Chronic venous hypertension (idiopathic) with inflammation of left lower extremity G29.52 Acute systolic (congestive) heart failure Follow-up Appointments Return Appointment in 1 week. - Friday afternoon Nurse Visit: - Tuesday Dressing Change Frequency Do not change entire dressing for one week. Skin Barriers/Peri-Wound Care TCA Cream or Ointment - mixed with ketoconazole liberally entire left leg. Wound Cleansing May shower and wash wound with soap and water. - use a cast protector. Primary Wound Dressing Wound #1 Left,Circumferential Lower Leg Calcium Alginate with Silver - weeping areas as well. Secondary Dressing Wound #1 Left,Circumferential Lower Leg Dry Gauze ABD pad Zetuvit or Kerramax Carboflex Edema Control 4 layer compression: Left lower extremity Patient to wear own compression stockings - apply to right leg in the morning and remove at night. Avoid standing for long periods of time Elevate legs to the level of the heart or above for 30 minutes daily and/or when sitting, a frequency of: - throughout the day. Electronic Signature(s) Signed: 02/28/2020 4:13:32 PM By: Deon Pilling Signed: 03/01/2020 6:55:41 AM By: Linton Ham MD Entered By: Deon Pilling on 02/28/2020  13:15:44 -------------------------------------------------------------------------------- Problem List Details Patient Name: Date of Service: [name]. 02/28/2020 12:30 PM Medical Record [id number] Patient Account Number: [id number] Date of Birth/Sex: Treating RN: Oct 30, 1951 (69 y.o. M) Primary Care Provider: Aura Dials Other Clinician: Referring Provider: Treating Provider/Extender:Cristobal Advani, Suzi Roots, Tacy Learn in Treatment: 3 Active Problems ICD-10 Evaluated Encounter Code Description Active Date Today Diagnosis L97.221 Non-pressure chronic ulcer of left calf limited to 02/07/2020 No Yes breakdown of skin I89.0 Lymphedema, not elsewhere classified 02/07/2020 No Yes I87.322 Chronic venous hypertension (idiopathic) with 02/07/2020 No Yes inflammation of left lower extremity G64.40 Acute systolic (congestive) heart failure 02/07/2020 No Yes Inactive Problems Resolved Problems  Electronic Signature(s) Signed: 03/01/2020 6:55:41 AM By: Linton Ham MD Entered By: Linton Ham on 02/28/2020 13:27:51 -------------------------------------------------------------------------------- Progress Note Details Patient Name: Date of Service: [name]. 02/28/2020 12:30 PM Medical Record [id number]:[id number] Patient Account Number: [id number] Date of Birth/Sex: Treating RN: Dec 11, 1950 (69 y.o. M) Primary Care Provider: Aura Dials Other Clinician: Referring Provider: Treating Provider/Extender:Colter Magowan, Suzi Roots, Tacy Learn in Treatment: 3 Subjective History of Present Illness (HPI) [name] History of Present Illness (HPI) 09/27/2019 on evaluation today patient appears to be doing poorly in regard to his bilateral lower extremities he does have bilateral lower extremity lymphedema. With that being said he also has a history of diabetes type 2, hypertension, congestive heart failure, and apparently has chronic venous insufficiency which has led to  the lymphedema. Currently he also shows signs of an infection of the left lower extremity which does have me concerned at this point as well. He tells me that he is not really having any significant pain but has had a lot of trouble with his legs in general. He does have a compression stocking he is wearing on the right lower extremity he was wrapped with an Unna boot on the left lower extremity. The patient tells me that overall he has been doing with this for several weeks and home health has been initiated. He has not been on any antibiotics yet and he tells me that the home health nurse was not sure that it was infected based on what I see today I am concerned that this likely is infected at this point. Fortunately however there does not appear to be any evidence of systemic infection which is good news. The Unna boot does seem to be helping control the edema although he may benefit from a stronger compression wrap I think that at this point without having the ABIs completed the Unna boot is where I would stand. He is supposed to be having an arterial study with ABI as ordered by his cardiologist but he states he may have missed that appointment and has not called to reschedule he needs to contact them to get this rescheduled as soon as possible I explained to him. He tells me that he is draining a lot as far as the left lower extremity is concerned and that is something that he feels like is not lasting very long as far as the wraps are concerned when they put him on they seem to get wet extremely quickly. 10/04/2019 on evaluation today patient appears to be doing better compared to last week's evaluation. Fortunately there is no signs of active infection at this time. He has been tolerating the dressing changes without complication. He did undergo arterial studies yesterday and has an appointment tomorrow with the vascular specialist. With that being said his ABI on the left was 1.25 on the  right 1.19 with a TBI of 0.61 on the left and 0.69 on the right. There was stated to be no evidence of obvious significant arterial disease and the patient had pretty much triphasic blood flow throughout. Overall I am very pleased with how things seem to be progressing. We will see if the vascular doctor says anything different tomorrow but I even at this point I feel like he would do well with a stronger compression wrap to try to get some the fluid out of his leg at this point. The good news is his drainage has slowed down considerably I do believe the antibiotics have been beneficial for him. 10/18/2019 on evaluation  today patient appears to be doing poorly in regard to his left lower extremity. He still having a lot of drainage and weeping from his leg this appears to be much more erythematous and in fact I am concerned that the erythema may be spreading up his leg as well. He has not experienced any fevers, chills, nausea, vomiting, or diarrhea up to this point. I explained to him that I really feel like based on what I am seeing he may need to potentially go to the hospital for evaluation and treatment potentially even with IV antibiotics depending on what they see. With that being said he tells me that he is not really able to do that due to the fact that he is the primary caregiver for his wife who is legally blind. He states he is not good to be able to have anything to help as far as caring for her if he were to go into the hospital. Nonetheless he is unsure what to do in this regard. I really think that he needs faster treatment. For that reason I am going to likely try to get in touch with the infectious disease office to see if I can get him in sooner than [place]. 10/25/2019 on evaluation today patient appears to be doing a little better in regard to his weeping and edema compared to the last time I saw him. The dorsal surface of his foot is a little bit drier compared to what was  this is good news. He did see Dr. Drucilla Schmidt on 10/22/2019. Dr. Drucilla Schmidt did feel that potentially this could be secondary to some cellulitis and he recommended subsequently placing the patient on Zyvox which the patient feels like has helped as well. There does not appear to be any signs of active infection at this time which is good news systemically. I am very pleased that Dr. Drucilla Schmidt was able to see him so quickly I do appreciate this greatly. With that being said he also did recommend an MRI for the patient to evaluate for any deeper signs of infection. That is actually scheduled for this coming Tuesday. He also has a follow-up appoint with Dr. Drucilla Schmidt on Tuesday. The last thing Dr. Drucilla Schmidt did mention was the possibility of more aggressive diuresis. The patient is on torsemide. With that being said this is managed by his cardiologist. I think we may want to get in touch with her clinic and see if possibly this is something that could be increased for a short amount of time depending on their recommendations to see if we can get some additional fluid off of him which in turn would likely help tremendously with his weeping and edema. He is in agreement with this plan if his cardiologist is in agreement. 11/01/2019 on evaluation today patient unfortunately notes that he did not get his MRI done which was ordered by Dr. Drucilla Schmidt. Apparently when he went in it was 6:30 in the morning and they told him that he was there to get an MRI of his toes. With that being said the MRI was ordered of his ankle and foot and so long story short the patient said that he was not getting MRI of his toes and left. Dr. Drucilla Schmidt was alerted and again it does not appear that at all that was what he ordered that he did order the ankle and foot but nonetheless that is beside the point at this time. The patient still has considerable edema noted at this time the nurse  that came out last after just put a Kerlix and Coban wrap on the  patient she did not even properly wrap him. Subsequently they have also discharged him being that they state he is not skilled at this point. Therefore he is going to have to be seen here in the office only. Fortunately there is no signs of systemic infection he still has a lot of erythema around the ankle and lower extremity region again there are any specific open wounds just general weeping and evidence of erythema which Dr. Drucilla Schmidt is questioning whether or not this is even infection or if it is just more secondary to the patient's lymphedema. He has had venous reflux studies which were negative for any abnormal findings unfortunately that is also not good to be an answer for [place] here. I have recommended the patient today contact his primary care provider to see if they can increase his fluid pills and I also think we need to increase his compression wrap to a 4-layer compression wrap to try to more effectively manage his edema. 11/08/2019 on evaluation today patient appears unfortunately to still be doing poorly. I did speak with his primary care provider office last week they were supposed to call him to advise what should be done as far as his fluid pills were concerned. With that being said they did not contact him as far as the patient tells me. I am not really sure exactly what is going on as far as that is concerned. Nonetheless I really think that the patient needs to be admitted to the hospital for treatment likely as I explained to him he is going to require IV Lasix to get fluid off and to be honest also think he may need IV antibiotics. I have made all the outpatient referrals I can to try to get things under control he has been on oral antibiotics I really just have not seen any significant improvement he was even on linezolid. That was prescribed By infectious disease. Overall I am very concerned about the fact that this is not getting better. 11/22/2019 on evaluation today patient  unfortunately is not doing very well with regard to his lower extremity edema. The left lower extremity is significantly swollen and still significantly erythematous. He has bright green drainage. He did just see Dr. Drucilla Schmidt on 11/19/2019 and Dr. Drucilla Schmidt did not feel like that this was infected. Subsequently he states that he feels like the issue is more 1 of not being at maximal diuresis and recommended the patient see his cardiologist to ensure that this was achieved. Subsequently Dr. Drucilla Schmidt states this is a issue with venous stasis dermatitis which again I completely understand that that may definitely be the case but unfortunately we've not been able to get things under control even with maximum compression at a 4 layer compression wrap. The patient is still having significant issues. Either way I do think the patient is getting need to follow-up with his cardiologist to see what we can do Dr. Drucilla Schmidt did make mention that the patient had a flareup of infection that Keflex 500 mg 4 times a day or Augmentin twice a day would be good options for nonpurulent cellulitis. The patient was again supposed to have gone to the hospital on the weekend following Christmas after he got things settled with family as far as getting someone to take care of his wife. With that being said he tells me that when it came around to that time he just  didn't think about it. When asked him about why the drainage from his leg and the issues he is having that and remind him he told me "well it really hasn't changed". 11/29/2019 on evaluation today patient presents for follow-up concerning his left lower extremity edema and weeping. He did see his cardiologist at my request after I discussed with him the situation as well. This is Binnie Kand who is a family Designer, jewellery that he saw. Subsequently the patient was recommended to start metolazone 5 mg daily for the next 5 days. They also recommended that really he needed  essentially bed rest with elevation of his legs over the next week. With that being said the patient states that he is unable to even get in bed due to the fact that he has back trouble and has to sleep in his recliner. He never really gets in the bed at all. This is probably part of the issue coupled with his heart not functioning quite as well as what we would like to see and then subsequently he also has the issue of being overweight which contributes to this as well. Overall were still trying to figure out how to best manage this to get his tweaking under control and prevent anything from worsening. Again Dr. Drucilla Schmidt really did not feel like there was any evidence of infectious processes going on at this point. 12/06/2019 on evaluation today patient's leg actually appears to be doing better with regard to edema control I am actually pleased in this regard. He also seems to be improving as far as drainage is concerned from the leg this is much better than what it was in the past. Fortunately there is no signs of active infection which is good news and overall the patient states he is actually happier with where things stand at this point. 12/13/2019 on evaluation today patient unfortunately is very dry with cracked skin noted at this point. Fortunately there is no signs of active infection at this time. With that being said I think he is really needs some moisturizing lotion something really good to counter loosen up the dry cracked skin I think even if 40% urea cream would be ideal at this point. I discussed this with him today. In fact even look this up on his phone so that he can order it if he needs to although he may be able to find it locally I am just not sure. I told him to check with his pharmacy first. 12/20/2019 on evaluation today patient unfortunately continues to have issues with swelling of his left lower extremity. We did use Tubigrip last week to try to give him a chance to be able to  take this off and take a shower and then reapply it. With that being said he just cut it off and then did not have anything to reapply he did that on Friday after we saw him on Thursday and had nothing on in the interim for almost an entire week since. Obviously that was not the direction that was given to him and when I asked him about it he stated that he just could not bend over to get off and he does not have anyone to help him. With that being said I do not think this is can be possible to have him take this off to take a shower simply due to the fact that again were not going to be able to keep the edema under control and he can get  this off and back on it hurts his back way too much. 12/27/2019 on evaluation today patient actually appears to be mostly dry in regard to his lower extremity wounds in general. Fortunately there is no signs of active infection at this time. No fever chills noted. I really do not see anything open at this point which is good news. He does have a lot of dry skin that is flaking off. With that being said I think that likely I recommend wrapping him 1 more week and we can utilize the objective coating layer from the Profore wraps in order to prevent anything from sticking to the wound bed. After that I am hopeful we will be able to get him into the compression socks that he has he supposed to bring 1 of those with him next week he also is supposed to switch out and start using the new ones on his right leg as when he has is completely worn out. 01/10/2020 upon evaluation today patient appears to be doing poorly at this point in regard to his lower extremities on the left. He still continues to have significant weeping he actually took the wrap off 2 days ago because he stated "we were planning to take it off today anyway". He did not however put any compression stockings on but obviously was not part of the plan. Still he continues to have significant swelling I really  do not know that this is going to make a significant improvement and less were able to keep him well compressed all the time and again also get his edema under good control. He was supposed to go back to see his cardiologist although I do not think that is actually been an appointment that he has made at this point that I can find anyway. He also is still taking the double dose of his Lasix but again I am not sure that that is doing the job here. 01/17/2020 upon evaluation today patient appears to be doing quite well with regard to his leg all things considered. This is better than last week. With that being said he still has significant edema and weeping unfortunately this is going to continue to be an issue. There is no signs of active infection. He tells me that he got a new prescription for torsemide. To be honest I was not quite sure that he had been prescribed that yet I thought he had been prescribed Lasix but either way I think there may be a little bit of confusion here. I discussed this with the patient today as well. I also discussed with the patient the fact that I do believe he is going to likely require additional follow-up with his cardiologist. I suggested he give them a call to make an appointment. He told me that he called them last week but he did not make an appointment. I am not exactly sure what he called in for or what he was looking for out of that. Nonetheless there does not appear to be any signs of active infection at this time. No fevers, chills, nausea, vomiting, or diarrhea. 01/24/2020 upon evaluation today patient appears to be doing poorly in regard to his left lower extremity. He has bright green almost teal colored drainage which is very consistent with likely Pseudomonas. He also has a Pseudomonas-like smell to the drainage noted at this point. Fortunately there is no signs of active systemic infection at this time. No fevers, chills, nausea, vomiting, or diarrhea.  With that  being said I explained to the patient that this can change quickly that he can definitely become septic as a result of what we are seeing here if he does not treat this appropriately. With that being said I really think he needs to be in the ER as soon as possible in fact I really want him to go today he tells me there is no way he is likely to be able to get that set up in time. He takes care of his wife who is legally blind and I completely understand the constraints there nonetheless he is going to take care of himself if he is good to be around to take care of her. 01/31/2020 upon evaluation today patient unfortunately appears to be doing poorly yet again. There is no signs of systemic infection yet although that is a big concern for me at this point to be honest. No fevers, chills, nausea, vomiting, or diarrhea. With regard to the oral antibiotics at this point the patient does have a prescription for trazodone which I was under the impression he was taking. He states that he is not really sure that he is taking that on a regular basis or not. To be honest is on his list he tells me that in his pill bottle he has set pills he takes every day and he is really unsure of exactly what he takes. He needs to go back and have a look at that and try to figure things out. Either way if we were to place him on something like Cipro or Levaquin with him still being on trazodone that can prolong the QT interval which can be also dangerous. I am not willing to do that. The patient really needs to go to the hospital for in my opinion IV antibiotic therapy. He was positive for Pseudomonas and when he came in today his dressing was completely saturated with blue-green drainage consistent with Pseudomonas as well that I saw even before I saw the results of the culture and already knew what we were dealing with. ADMISSION 02/07/2020 This is a 69 year old man followed for a prolonged period of time at  our sister clinic in [place]. When he was seen last week he was felt to have cellulitis with a greenish drainage and he was sent to the hospital. He has lymphedema chronic venous insufficiency and was being treated for a wound on his left lower leg. He is also a type II diabetic although the patient states he is not taking anything for his diabetes and does not monitor his blood sugar. He was admitted to [facility]. A discharge summary is not yet available but he was just discharged yesterday. In the hospital he was felt to have chronic or acute on chronic systolic heart failure. He was treated with IV Lasix. Felt his dry weight was 128 kg. He was aggressively changed to torsemide 40 twice daily. With regards to his lower extremity edema he was treated with an Haematologist. He was sent here because this was felt to be closer to his actual home which is in [facility]. I do not believe he received antibiotics. Other issues included paroxysmal atrial fibrillation and nonsustained V. tach with PVCs. He came in the clinic today with the Unna boots looking to be a mass. This was according to our intake nurse 02/28/2020; the patient I saw once 3 weeks ago. He had severe edema likely secondary to chronic venous insufficiency and lymphedema with superficial wounds on  his posterior lower leg but marked stasis dermatitis. We put TCA and ketoconazole in his skin silver alginate and put him on compression. All that is the last we have seen of him. He tells me he took the wrap off 2 days ago i.e. it stayed on for 2-1/2 weeks. He also told us that he was in hospital however we could see no such hospitalization. Objective Constitutional Patient is hypotensive. However he appears well. Pulse regular and within target range for patient.Marland Kitchen Respirations regular, non-labored and within target range.. Temperature is normal and within the target range for the patient.Marland Kitchen Appears in no distress. Vitals Time  Taken: 12:45 PM, Height: 69 in, Weight: 280 lbs, BMI: 41.3, Temperature: 98 F, Pulse: 98 bpm, Respiratory Rate: 20 breaths/min, Blood Pressure: 91/79 mmHg. Cardiovascular Pedal pulses are palpable on the left. Nonpitting edema.Marland Kitchen Psychiatric Somewhat forgetful. General Notes: Wound exam; no drainage. The patient has loss of surface epithelium medially and anteriorly. Significant lower extremity edema anteriorly with dry scaly debris over the wound surface. Integumentary (Hair, Skin) Erythema but no tenderness. Wound #1 status is Open. Original cause of wound was Gradually Appeared. The wound is located on the Left,Circumferential Lower Leg. The wound measures 18cm length x 39cm width x 0.1cm depth; 551.35cm^2 area and 55.135cm^3 volume. There is Fat Layer (Subcutaneous Tissue) Exposed exposed. There is no tunneling or undermining noted. There is a large amount of serous drainage noted. The wound margin is distinct with the outline attached to the wound base. There is large (67-100%) red granulation within the wound bed. There is a small (1- 33%) amount of necrotic tissue within the wound bed including Adherent Slough. Assessment Active Problems ICD-10 Non-pressure chronic ulcer of left calf limited to breakdown of skin Lymphedema, not elsewhere classified Chronic venous hypertension (idiopathic) with inflammation of left lower extremity Acute systolic (congestive) heart failure Procedures Wound #1 Pre-procedure diagnosis of Wound #1 is a Lymphedema located on the Left,Circumferential Lower Leg . There was a Four Layer Compression Therapy Procedure by Deon Pilling, RN. Post procedure Diagnosis Wound #1: Same as Pre-Procedure Plan Follow-up Appointments: Return Appointment in 1 week. - Friday afternoon Nurse Visit: - Tuesday Dressing Change Frequency: Do not change entire dressing for one week. Skin Barriers/Peri-Wound Care: TCA Cream or Ointment - mixed with ketoconazole liberally  entire left leg. Wound Cleansing: May shower and wash wound with soap and water. - use a cast protector. Primary Wound Dressing: Wound #1 Left,Circumferential Lower Leg: Calcium Alginate with Silver - weeping areas as well. Secondary Dressing: Wound #1 Left,Circumferential Lower Leg: Dry Gauze ABD pad Zetuvit or Kerramax Carboflex Edema Control: 4 layer compression: Left lower extremity Patient to wear own compression stockings - apply to right leg in the morning and remove at night. Avoid standing for long periods of time Elevate legs to the level of the heart or above for 30 minutes daily and/or when sitting, a frequency of: - throughout the day. 1. Silver alginate 2. Liberal TCA and ketoconazole to the skin on the lower extremity 3. Still under 4-layer compression 4. I am going to bring him back next Tuesday for a nurse visit to make sure we change the dressing here I will see him again next Friday. 5. Severe stasis dermatitis. This is not going to get any better unless we can get the swelling in his leg under control and a 2-1/2-week hiatus is just not going to work. Electronic Signature(s) Signed: 03/01/2020 6:55:41 AM By: Linton Ham MD Entered By: Linton Ham on  02/28/2020 13:35:11 -------------------------------------------------------------------------------- SuperBill Details Patient Name: Date of Service: [name], [name] 02/28/2020 Medical Record [id number] Patient Account Number: [id number] Date of Birth/Sex: Treating RN: [birth year] (69 y.o. M) Primary Care Provider: Aura Dials Other Clinician: Referring Provider: Treating Provider/Extender:Tyresse Jayson, Suzi Roots, Tacy Learn in Treatment: 3 Diagnosis Coding ICD-10 Codes Code Description [phone] Non-pressure chronic ulcer of left calf limited to breakdown of skin I89.0 Lymphedema, not elsewhere classified I87.322 Chronic venous hypertension (idiopathic) with inflammation of left lower  extremity O53.66 Acute systolic (congestive) heart failure Facility Procedures The patient participates with Medicare or their insurance follows the Medicare Facility Guidelines: CPT4 Code Description Modifier Quantity [id number] (Facility Use Only) [phone] - [place] [facility] Physician Procedures Electronic Signature(s) Signed: 03/01/2020 6:55:41 AM By: Linton Ham MD Entered By: Linton Ham on 02/28/2020 13:35:30

## 2020-03-04 ENCOUNTER — Other Ambulatory Visit: Payer: Self-pay

## 2020-03-04 ENCOUNTER — Encounter (HOSPITAL_BASED_OUTPATIENT_CLINIC_OR_DEPARTMENT_OTHER): Payer: Medicare Other | Admitting: Internal Medicine

## 2020-03-04 DIAGNOSIS — I87332 Chronic venous hypertension (idiopathic) with ulcer and inflammation of left lower extremity: Secondary | ICD-10-CM | POA: Diagnosis not present

## 2020-03-04 NOTE — Progress Notes (Signed)
COVE, HAYDON ([id number]) Visit Report for 03/04/2020 SuperBill Details Patient Name: Date of Service: AMIRI, RIECHERS 03/04/2020 Medical Record [id number] Patient Account Number: [id number] Date of Birth/Sex: Treating RN: 1951-03-01 (69 y.o. [name] Primary Care Provider: Aura Dials Other Clinician: Referring Provider: Treating Provider/Extender:Robson, Suzi Roots, Tacy Learn in Treatment: 3 Diagnosis Coding ICD-10 Codes Code Description [phone] Non-pressure chronic ulcer of left calf limited to breakdown of skin I89.0 Lymphedema, not elsewhere classified I87.322 Chronic venous hypertension (idiopathic) with inflammation of left lower extremity V78.46 Acute systolic (congestive) heart failure Facility Procedures The patient participates with Medicare or their insurance follows the Medicare Facility Guidelines CPT4 Code Description Modifier Quantity [id number] (Facility Use Only) [phone] - [place] [facility] Electronic Signature(s) Signed: 03/04/2020 5:47:38 PM By: Linton Ham MD Signed: 03/04/2020 6:13:01 PM By: Levan Hurst RN, BSN Entered By: Levan Hurst on 03/04/2020 13:40:57

## 2020-03-04 NOTE — Progress Notes (Signed)
ANDRE, SWANDER ([id number]) Visit Report for 02/28/2020 Arrival Information Details Patient Name: Date of Service: DARYLL, SPISAK 02/28/2020 12:30 PM Medical Record [id number] Patient Account Number: [id number] Date of Birth/Sex: Treating RN: 05-22-1951 (69 y.o. Marvis Repress Primary Care Haim Hansson: Aura Dials Other Clinician: Referring Shirlene Andaya: Treating Monique Hefty/Extender:Robson, Suzi Roots, Tacy Learn in Treatment: 3 Visit Information History Since Last Visit Added or deleted any medications: No Patient Arrived: Ambulatory Any new allergies or adverse reactions: No Arrival Time: 12:45 Had a fall or experienced change in No Accompanied By: self activities of daily living that may affect Transfer Assistance: None risk of falls: Patient Identification Verified: Yes Signs or symptoms of abuse/neglect since last No Secondary Verification Process Yes visito Completed: Hospitalized since last visit: No Patient Has Alerts: Yes Implantable device outside of the clinic excluding No Patient Alerts: Patient on Blood cellular tissue based products placed in the center Thinner since last visit: ABI, BIL: non Has Dressing in Place as Prescribed: No compress Has Compression in Place as Prescribed: No Pain Present Now: Yes Electronic Signature(s) Signed: 02/29/2020 4:21:34 PM By: Kela Millin Entered By: Kela Millin on 02/28/2020 12:46:07 -------------------------------------------------------------------------------- Compression Therapy Details Patient Name: Date of Service: [name] 02/28/2020 12:30 PM Medical Record [id number] Patient Account Number: [id number] Date of Birth/Sex: Treating RN: 01/25/1951 (69 y.o. M) Primary Care Ulisses Vondrak: Aura Dials Other Clinician: Referring Salmaan Patchin: Treating Pauline Trainer/Extender:Robson, Suzi Roots, Tacy Learn in Treatment: 3 Compression Therapy Performed for Wound Wound #1  Left,Circumferential Lower Leg Assessment: Performed By: Clinician Deon Pilling, RN Compression Type: Four Layer Post Procedure Diagnosis Same as Pre-procedure Electronic Signature(s) Signed: 02/28/2020 4:13:32 PM By: Deon Pilling Entered By: Deon Pilling on 02/28/2020 13:16:25 -------------------------------------------------------------------------------- Encounter Discharge Information Details Patient Name: Date of Service: [name] 02/28/2020 12:30 PM Medical Record [id number] Patient Account Number: [id number] Date of Birth/Sex: Treating RN: 1951/02/02 (69 y.o. [name] Primary Care Austen Oyster: Aura Dials Other Clinician: Referring Taraann Olthoff: Treating Shadoe Cryan/Extender:Robson, Suzi Roots, Tacy Learn in Treatment: 3 Encounter Discharge Information Items Discharge Condition: Stable Ambulatory Status: Ambulatory Discharge Destination: Home Transportation: Private Auto Accompanied By: self Schedule Follow-up Appointment: Yes Clinical Summary of Care: Patient Declined Electronic Signature(s) Signed: 02/28/2020 4:28:12 PM By: Baruch Gouty RN, BSN Entered By: Baruch Gouty on 02/28/2020 13:37:17 -------------------------------------------------------------------------------- Lower Extremity Assessment Details Patient Name: Date of Service: [name] 02/28/2020 12:30 PM Medical Record [id number] Patient Account Number: [id number] Date of Birth/Sex: Treating RN: [birth year] (69 y.o. Marvis Repress Primary Care Kaithlyn Teagle: Aura Dials Other Clinician: Referring Thurmond Hildebran: Treating Brodyn Depuy/Extender:Robson, Suzi Roots, Tacy Learn in Treatment: 3 Edema Assessment Assessed: [Left: No] [Right: No] Edema: [Left: Yes] [Right: Yes] Calf Left: Right: Point of Measurement: 42 cm From Medial Instep 50.5 cm 47 cm Ankle Left: Right: Point of Measurement: 17 cm From Medial Instep 32.5 cm 31 cm Vascular  Assessment Pulses: Dorsalis Pedis Palpable: [Left:Yes] [Right:Yes] Electronic Signature(s) Signed: 02/29/2020 4:21:34 PM By: Kela Millin Entered By: Kela Millin on 02/28/2020 12:49:42 -------------------------------------------------------------------------------- Multi Wound Chart Details Patient Name: Date of Service: [name] 02/28/2020 12:30 PM Medical Record [id number] Patient Account Number: [id number] Date of Birth/Sex: Treating RN: 14-Apr-1951 (69 y.o. M) Primary Care Malak Duchesneau: Aura Dials Other Clinician: Referring Deshae Dickison: Treating Elzina Devera/Extender:Robson, Suzi Roots, Tacy Learn in Treatment: 3 Vital Signs Height(in): 69 Pulse(bpm): 98 Weight(lbs): 280 Blood Pressure(mmHg): 91/79 Body Mass Index(BMI): 41 Temperature(F): 98 Respiratory 20 Rate(breaths/min): Photos: [1:No Photos] [N/A:N/A] Wound Location: [1:Left, Circumferential Lower N/A Leg] Wounding Event: [1:Gradually Appeared] [N/A:N/A] Primary Etiology: [1:Lymphedema] [N/A:N/A] Comorbid History: [  1:Arrhythmia, Congestive Heart Failure, Hypertension, Type II Diabetes] [N/A:N/A] Date Acquired: [1:07/18/2019] [N/A:N/A] Weeks of Treatment: [1:3] [N/A:N/A] Wound Status: [1:Open] [N/A:N/A] Measurements L x W x D 18x39x0.1 [N/A:N/A] (cm) Area (cm) : [1:551.35] [N/A:N/A] Volume (cm) : [1:55.135] [N/A:N/A] % Reduction in Area: [1:-68.70%] [N/A:N/A] % Reduction in Volume: [1:-68.70%] [N/A:N/A] Classification: [1:Full Thickness Without Exposed Support Structures] [N/A:N/A] Exudate Amount: [1:Large] [N/A:N/A] Exudate Type: [1:Serous] [N/A:N/A] Exudate Color: [1:amber] [N/A:N/A] Wound Margin: [1:Distinct, outline attached N/A] Granulation Amount: [1:Large (67-100%)] [N/A:N/A] Granulation Quality: [1:Red] [N/A:N/A] Necrotic Amount: [1:Small (1-33%)] [N/A:N/A] Exposed Structures: [1:Fat Layer (Subcutaneous N/A Tissue) Exposed: Yes Fascia: No Tendon: No Muscle: No Joint: No  Bone: No] Epithelialization: [1:None Compression Therapy] [N/A:N/A N/A] Treatment Notes Electronic Signature(s) Signed: 03/01/2020 6:55:41 AM By: Linton Ham MD Entered By: Linton Ham on 02/28/2020 13:27:57 -------------------------------------------------------------------------------- Multi-Disciplinary Care Plan Details Patient Name: Date of Service: [name] 02/28/2020 12:30 PM Medical Record [id number] Patient Account Number: [id number] Date of Birth/Sex: Treating RN: [birth year] (69 y.o. M) Primary Care Varie Machamer: Aura Dials Other Clinician: Referring Evadne Ose: Treating Mellie Buccellato/Extender:Robson, Suzi Roots, Tacy Learn in Treatment: 3 Active Inactive Wound/Skin Impairment Nursing Diagnoses: Knowledge deficit related to ulceration/compromised skin integrity Goals: Patient/caregiver will verbalize understanding of skin care regimen Date Initiated: 02/28/2020 Target Resolution Date: 03/21/2020 Goal Status: Active Interventions: Assess patient/caregiver ability to perform ulcer/skin care regimen upon admission and as needed Provide education on ulcer and skin care Treatment Activities: Skin care regimen initiated : 02/28/2020 Topical wound management initiated : 02/28/2020 Notes: Electronic Signature(s) Signed: 02/28/2020 4:13:32 PM By: Deon Pilling Entered By: Deon Pilling on 02/28/2020 13:07:24 -------------------------------------------------------------------------------- Pain Assessment Details Patient Name: Date of Service: [name] 02/28/2020 12:30 PM Medical Record [id number] Patient Account Number: [id number] Date of Birth/Sex: Treating RN: 01-01-1951 (69 y.o. Marvis Repress Primary Care Treyveon Mochizuki: Aura Dials Other Clinician: Referring Tarita Deshmukh: Treating Rashad Obeid/Extender:Robson, Suzi Roots, Tacy Learn in Treatment: 3 Active Problems Location of Pain Severity and Description of Pain Patient Has Paino  Yes Site Locations Pain Location: Pain in Ulcers With Dressing Change: Yes Duration of the Pain. Constant / Intermittento Constant Rate the pain. Current Pain Level: 2 Worst Pain Level: 8 Least Pain Level: 7 Tolerable Pain Level: 4 Character of Pain Describe the Pain: Burning Pain Management and Medication Current Pain Management: Electronic Signature(s) Signed: 02/29/2020 4:21:34 PM By: Kela Millin Entered By: Kela Millin on 02/28/2020 12:47:13 -------------------------------------------------------------------------------- Patient/Caregiver Education Details Patient Name: Date of Service: [name] 4/15/2021andnbsp12:30 PM Medical Record [id number] Patient Account Number: [id number] Date of Birth/Gender: Treating RN: 09/23/1951 (69 y.o. M) Primary Care Physician: Aura Dials Other Clinician: Referring Physician: Treating Physician/Extender:Robson, Suzi Roots, Tacy Learn in Treatment: 3 Education Assessment Education Provided To: Patient Education Topics Provided Wound/Skin Impairment: Handouts: Skin Care Do's and Dont's Methods: Explain/Verbal Responses: Reinforcements needed Electronic Signature(s) Signed: 02/28/2020 4:13:32 PM By: Deon Pilling Entered By: Deon Pilling on 02/28/2020 13:07:32 -------------------------------------------------------------------------------- Wound Assessment Details Patient Name: Date of Service: YUVAAN, OLANDER 02/28/2020 12:30 PM Medical Record [id number] Patient Account Number: [id number] Date of Birth/Sex: Treating RN: [birth year] (69 y.o. Marvis Repress Primary Care Wanya Bangura: Aura Dials Other Clinician: Referring Brayen Bunn: Treating Jireh Vinas/Extender:Robson, Suzi Roots, Tacy Learn in Treatment: 3 Wound Status Wound Number: 1 Primary Lymphedema Etiology: Wound Location: Left, Circumferential Lower Leg Wound Open Wounding Event: Gradually Appeared Status: Date  Acquired: 07/18/2019 Comorbid Arrhythmia, Congestive Heart Failure, Weeks Of Treatment: 3 History: Hypertension, Type II Diabetes Clustered Wound: No Photos Wound Measurements Length: (cm) 18 % Reduct Width: (cm) 39 % Reduct Depth: (cm)  0.1 Epitheli Area: (cm) 551.35 Tunneli Volume: (cm) 55.135 Undermi Wound Description Classification: Full Thickness Without Exposed Support Foul Odo Structures Slough/F Wound Distinct, outline attached Margin: Exudate Large Amount: Exudate Serous Type: Exudate amber Color: Wound Bed Granulation Amount: Large (67-100%) Granulation Quality: Red Fascia E Necrotic Amount: Small (1-33%) Fat Laye Necrotic Quality: Adherent Slough Tendon E Muscle E Joint Ex Bone Exp r After Cleansing: No ibrino Yes Exposed Structure xposed: No r (Subcutaneous Tissue) Exposed: Yes xposed: No xposed: No posed: No osed: No ion in Area: -68.7% ion in Volume: -68.7% alization: None ng: No ning: No Treatment Notes Wound #1 (Left, Circumferential Lower Leg) 2. Periwound Care Antifungal cream Barrier cream TCA Cream 3. Primary Dressing Applied Calcium Alginate Ag 4. Secondary Dressing ABD Pad Kerramax/Xtrasorb 6. Support Layer Applied 4 layer compression wrap Electronic Signature(s) Signed: 02/29/2020 4:21:34 PM By: Kela Millin Signed: 03/04/2020 1:29:28 PM By: Sandre Kitty Entered By: Sandre Kitty on 02/28/2020 14:54:29 -------------------------------------------------------------------------------- Vitals Details Patient Name: Date of Service: [name] 02/28/2020 12:30 PM Medical Record [id number] Patient Account Number: [id number] Date of Birth/Sex: Treating RN: 05/12/1951 (69 y.o. Marvis Repress Primary Care Tylee Yum: Aura Dials Other Clinician: Referring Deaisha Welborn: Treating Jash Wahlen/Extender:Robson, Suzi Roots, Tacy Learn in Treatment: 3 Vital Signs Time Taken: 12:45 Temperature (F):  98 Height (in): 69 Pulse (bpm): 98 Weight (lbs): 280 Respiratory Rate (breaths/min): 20 Body Mass Index (BMI): 41.3 Blood Pressure (mmHg): 91/79 Reference Range: 80 - 120 mg / dl Electronic Signature(s) Signed: 02/29/2020 4:21:34 PM By: Kela Millin Entered By: Kela Millin on 02/28/2020 12:46:33

## 2020-03-04 NOTE — Progress Notes (Signed)
MAEJOR, Keith ([id number]) Visit Report for 03/04/2020 Arrival Information Details Patient Name: Date of Service: [name], [name] 03/04/2020 11:15 AM Medical Record [id number] Patient Account Number: [id number] Date of Birth/Sex: Treating RN: 1951-08-12 (69 y.o. [name]) Carlene Coria Primary Care Ilo Beamon: Aura Dials Other Clinician: Referring Locklyn Henriquez: Treating Shandrea Lusk/Extender:Robson, Suzi Roots, Tacy Learn in Treatment: 3 Visit Information History Since Last Visit Added or deleted any medications: No Patient Arrived: Ambulatory Any new allergies or adverse reactions: No Arrival Time: 11:55 Had a fall or experienced change in No Accompanied By: self activities of daily living that may affect Transfer Assistance: None risk of falls: Patient Identification Verified: Yes Signs or symptoms of abuse/neglect since last No Secondary Verification Process Yes visito Completed: Hospitalized since last visit: No Patient Has Alerts: Yes Implantable device outside of the clinic excluding No Patient Alerts: Patient on Blood cellular tissue based products placed in the center Thinner since last visit: ABI, BIL: non Has Dressing in Place as Prescribed: Yes compress Pain Present Now: No Electronic Signature(s) Signed: 03/04/2020 1:29:28 PM By: Sandre Kitty Entered By: Sandre Kitty on 03/04/2020 11:55:35 -------------------------------------------------------------------------------- Compression Therapy Details Patient Name: Date of Service: [name] 03/04/2020 11:15 AM Medical Record [id number] Patient Account Number: [id number] Date of Birth/Sex: Treating RN: Nov 25, 1950 (69 y.o. [name] Primary Care Edric Fetterman: Aura Dials Other Clinician: Referring Countess Biebel: Treating Siddh Vandeventer/Extender:Robson, Suzi Roots, Tacy Learn in Treatment: 3 Compression Therapy Performed for Wound Wound #1 Left,Circumferential Lower  Leg Assessment: Performed By: Clinician Levan Hurst, RN Compression Type: Four Layer Electronic Signature(s) Signed: 03/04/2020 6:13:01 PM By: Levan Hurst RN, BSN Entered By: Levan Hurst on 03/04/2020 13:39:23 -------------------------------------------------------------------------------- Encounter Discharge Information Details Patient Name: Date of Service: [name] 03/04/2020 11:15 AM Medical Record [id number] Patient Account Number: [id number] Date of Birth/Sex: Treating RN: [birth year] (69 y.o. [name] Primary Care Duong Haydel: Aura Dials Other Clinician: Referring Keyvon Herter: Treating Eugina Row/Extender:Robson, Suzi Roots, Tacy Learn in Treatment: 3 Encounter Discharge Information Items Discharge Condition: Stable Ambulatory Status: Ambulatory Discharge Destination: Home Transportation: Private Auto Accompanied By: alone Schedule Follow-up Appointment: Yes Clinical Summary of Care: Patient Declined Electronic Signature(s) Signed: 03/04/2020 6:13:01 PM By: Levan Hurst RN, BSN Entered By: Levan Hurst on 03/04/2020 13:40:43 -------------------------------------------------------------------------------- Wound Assessment Details Patient Name: Date of Service: [name] 03/04/2020 11:15 AM Medical Record [id number] Patient Account Number: [id number] Date of Birth/Sex: Treating RN: 07-28-1951 (68 y.o. [name]) Carlene Coria Primary Care Oma Alpert: Aura Dials Other Clinician: Referring Alyssandra Hulsebus: Treating Julius Matus/Extender:Robson, Suzi Roots, Tacy Learn in Treatment: 3 Wound Status Wound Number: 1 Primary Etiology: Lymphedema Wound Location: Left, Circumferential Lower Leg Wound Status: Open Wounding Event: Gradually Appeared Date Acquired: 07/18/2019 Weeks Of Treatment: 3 Clustered Wound: No Wound Measurements Length: (cm) 18 Width: (cm) 39 Depth: (cm) 0.1 Area: (cm) 551.35 Volume: (cm) 55.135 Wound  Description Full Thickness Without Exposed Suppo Classification: Structures % Reduction in Area: -68.7% % Reduction in Volume: -68.7% rt Treatment Notes Wound #1 (Left, Circumferential Lower Leg) 1. Cleanse With Soap and water 2. Periwound Care Antifungal cream TCA Cream 3. Primary Dressing Applied Calcium Alginate Ag 4. Secondary Dressing ABD Pad Kerramax/Xtrasorb Other secondary dressing (specify in notes) 6. Support Layer Applied 4 layer compression wrap Notes carboflex secondary Electronic Signature(s) Signed: 03/04/2020 1:29:28 PM By: Sandre Kitty Signed: 03/04/2020 5:39:56 PM By: Carlene Coria RN Entered By: Sandre Kitty on 03/04/2020 11:55:47 -------------------------------------------------------------------------------- Vitals Details Patient Name: Date of Service: [name] 03/04/2020 11:15 AM Medical Record [id number] Patient Account Number: [id number] Date of Birth/Sex: Treating  RN: 1951-05-23 (69 y.o. [name]) Carlene Coria Primary Care Laker Thompson: Aura Dials Other Clinician: Referring Shanterria Franta: Treating Ayzia Day/Extender:Robson, Suzi Roots, Tacy Learn in Treatment: 3 Vital Signs Time Taken: 11:55 Temperature (F): 97.8 Height (in): 69 Pulse (bpm): 79 Weight (lbs): 280 Respiratory Rate (breaths/min): 20 Body Mass Index (BMI): 41.3 Blood Pressure (mmHg): 123/67 Reference Range: 80 - 120 mg / dl Electronic Signature(s) Signed: 03/04/2020 1:29:28 PM By: Sandre Kitty Entered By: Sandre Kitty on 03/04/2020 11:56:58

## 2020-03-07 ENCOUNTER — Encounter (HOSPITAL_BASED_OUTPATIENT_CLINIC_OR_DEPARTMENT_OTHER): Payer: Medicare Other | Admitting: Internal Medicine

## 2020-03-20 ENCOUNTER — Encounter (HOSPITAL_BASED_OUTPATIENT_CLINIC_OR_DEPARTMENT_OTHER): Payer: Medicare Other | Attending: Internal Medicine | Admitting: Internal Medicine

## 2020-03-20 DIAGNOSIS — I87322 Chronic venous hypertension (idiopathic) with inflammation of left lower extremity: Secondary | ICD-10-CM | POA: Diagnosis not present

## 2020-03-20 DIAGNOSIS — E663 Overweight: Secondary | ICD-10-CM | POA: Diagnosis not present

## 2020-03-20 DIAGNOSIS — Z6841 Body Mass Index (BMI) 40.0 and over, adult: Secondary | ICD-10-CM | POA: Insufficient documentation

## 2020-03-20 DIAGNOSIS — L97221 Non-pressure chronic ulcer of left calf limited to breakdown of skin: Secondary | ICD-10-CM | POA: Insufficient documentation

## 2020-03-20 DIAGNOSIS — I872 Venous insufficiency (chronic) (peripheral): Secondary | ICD-10-CM | POA: Insufficient documentation

## 2020-03-20 DIAGNOSIS — I5021 Acute systolic (congestive) heart failure: Secondary | ICD-10-CM | POA: Insufficient documentation

## 2020-03-20 DIAGNOSIS — I480 Paroxysmal atrial fibrillation: Secondary | ICD-10-CM | POA: Diagnosis not present

## 2020-03-20 DIAGNOSIS — I890 Lymphedema, not elsewhere classified: Secondary | ICD-10-CM | POA: Insufficient documentation

## 2020-03-20 DIAGNOSIS — I110 Hypertensive heart disease with heart failure: Secondary | ICD-10-CM | POA: Insufficient documentation

## 2020-03-20 DIAGNOSIS — I509 Heart failure, unspecified: Secondary | ICD-10-CM | POA: Diagnosis not present

## 2020-03-20 DIAGNOSIS — R21 Rash and other nonspecific skin eruption: Secondary | ICD-10-CM | POA: Insufficient documentation

## 2020-03-20 DIAGNOSIS — E119 Type 2 diabetes mellitus without complications: Secondary | ICD-10-CM | POA: Diagnosis not present

## 2020-03-20 NOTE — Progress Notes (Signed)
ETTORE, TREBILCOCK ([id number]) Visit Report for 03/20/2020 HPI Details Patient Name: Date of Service: REDMO NDOpal, [name]. 03/20/2020 10:30 A [name] Medical Record Number: [id number] Patient Account Number: [id number] Date of Birth/Sex: Treating RN: [birth year] (69 y.o. [name] Primary Care Provider: Aura Dials Other Clinician: Referring Provider: Treating Provider/Extender: Gabriel Carina in Treatment: 6 History of Present Illness HPI Description: [place] History of Present Illness (HPI) 09/27/2019 on evaluation today patient appears to be doing poorly in regard to his bilateral lower extremities he does have bilateral lower extremity lymphedema. With that being said he also has a history of diabetes type 2, hypertension, congestive heart failure, and apparently has chronic venous insufficiency which has led to the lymphedema. Currently he also shows signs of an infection of the left lower extremity which does have me concerned at this point as well. He tells me that he is not really having any significant pain but has had a lot of trouble with his legs in general. He does have a compression stocking he is wearing on the right lower extremity he was wrapped with an Unna boot on the left lower extremity. The patient tells me that overall he has been doing with this for several weeks and home health has been initiated. He has not been on any antibiotics yet and he tells me that the home health nurse was not sure that it was infected based on what I see today I am concerned that this likely is infected at this point. Fortunately however there does not appear to be any evidence of systemic infection which is good news. The Unna boot does seem to be helping control the edema although he may benefit from a stronger compression wrap I think that at this point without having the ABIs completed the Unna boot is where I would stand. He is supposed to be having an arterial study  with ABI as ordered by his cardiologist but he states he may have missed that appointment and has not called to reschedule he needs to contact them to get this rescheduled as soon as possible I explained to him. He tells me that he is draining a lot as far as the left lower extremity is concerned and that is something that he feels like is not lasting very long as far as the wraps are concerned when they put him on they seem to get wet extremely quickly. 10/04/2019 on evaluation today patient appears to be doing better compared to last week's evaluation. Fortunately there is no signs of active infection at this time. He has been tolerating the dressing changes without complication. He did undergo arterial studies yesterday and has an appointment tomorrow with the vascular specialist. With that being said his ABI on the left was 1.25 on the right 1.19 with a TBI of 0.61 on the left and 0.69 on the right. There was stated to be no evidence of obvious significant arterial disease and the patient had pretty much triphasic blood flow throughout. Overall I am very pleased with how things seem to be progressing. We will see if the vascular doctor says anything different tomorrow but I even at this point I feel like he would do well with a stronger compression wrap to try to get some the fluid out of his leg at this point. The good news is his drainage has slowed down considerably I do believe the antibiotics have been beneficial for him. 10/18/2019 on evaluation today patient appears to be doing  poorly in regard to his left lower extremity. He still having a lot of drainage and weeping from his leg this appears to be much more erythematous and in fact I am concerned that the erythema may be spreading up his leg as well. He has not experienced any fevers, chills, nausea, vomiting, or diarrhea up to this point. I explained to him that I really feel like based on what I am seeing he may need to potentially go to  the hospital for evaluation and treatment potentially even with IV antibiotics depending on what they see. With that being said he tells me that he is not really able to do that due to the fact that he is the primary caregiver for his wife who is legally blind. He states he is not good to be able to have anything to help as far as caring for her if he were to go into the hospital. Nonetheless he is unsure what to do in this regard. I really think that he needs faster treatment. For that reason I am going to likely try to get in touch with the infectious disease office to see if I can get him in sooner than [place]. 10/25/2019 on evaluation today patient appears to be doing a little better in regard to his weeping and edema compared to the last time I saw him. The dorsal surface of his foot is a little bit drier compared to what was this is good news. He did see Dr. Drucilla Schmidt on 10/22/2019. Dr. Drucilla Schmidt did feel that potentially this could be secondary to some cellulitis and he recommended subsequently placing the patient on Zyvox which the patient feels like has helped as well. There does not appear to be any signs of active infection at this time which is good news systemically. I am very pleased that Dr. Drucilla Schmidt was able to see him so quickly I do appreciate this greatly. With that being said he also did recommend an MRI for the patient to evaluate for any deeper signs of infection. That is actually scheduled for this coming Tuesday. He also has a follow-up appoint with Dr. Drucilla Schmidt on Tuesday. The last thing Dr. Drucilla Schmidt did mention was the possibility of more aggressive diuresis. The patient is on torsemide. With that being said this is managed by his cardiologist. I think we may want to get in touch with her clinic and see if possibly this is something that could be increased for a short amount of time depending on their recommendations to see if we can get some additional fluid off of him which in turn  would likely help tremendously with his weeping and edema. He is in agreement with this plan if his cardiologist is in agreement. 11/01/2019 on evaluation today patient unfortunately notes that he did not get his MRI done which was ordered by Dr. Drucilla Schmidt. Apparently when he went in it was 6:30 in the morning and they told him that he was there to get an MRI of his toes. With that being said the MRI was ordered of his ankle and foot and so long story short the patient said that he was not getting MRI of his toes and left. Dr. Drucilla Schmidt was alerted and again it does not appear that at all that was what he ordered that he did order the ankle and foot but nonetheless that is beside the point at this time. The patient still has considerable edema noted at this time the nurse that came out last after  just put a Kerlix and Coban wrap on the patient she did not even properly wrap him. Subsequently they have also discharged him being that they state he is not skilled at this point. Therefore he is going to have to be seen here in the office only. Fortunately there is no signs of systemic infection he still has a lot of erythema around the ankle and lower extremity region again there are any specific open wounds just general weeping and evidence of erythema which Dr. Drucilla Schmidt is questioning whether or not this is even infection or if it is just more secondary to the patient's lymphedema. He has had venous reflux studies which were negative for any abnormal findings unfortunately that is also not good to be an answer for [place] here. I have recommended the patient today contact his primary care provider to see if they can increase his fluid pills and I also think we need to increase his compression wrap to a 4-layer compression wrap to try to more effectively manage his edema. 11/08/2019 on evaluation today patient appears unfortunately to still be doing poorly. I did speak with his primary care provider office last week  they were supposed to call him to advise what should be done as far as his fluid pills were concerned. With that being said they did not contact him as far as the patient tells me. I am not really sure exactly what is going on as far as that is concerned. Nonetheless I really think that the patient needs to be admitted to the hospital for treatment likely as I explained to him he is going to require IV Lasix to get fluid off and to be honest also think he may need IV antibiotics. I have made all the outpatient referrals I can to try to get things under control he has been on oral antibiotics I really just have not seen any significant improvement he was even on linezolid. That was prescribed By infectious disease. Overall I am very concerned about the fact that this is not getting better. 11/22/2019 on evaluation today patient unfortunately is not doing very well with regard to his lower extremity edema. The left lower extremity is significantly swollen and still significantly erythematous. He has bright green drainage. He did just see Dr. Drucilla Schmidt on 11/19/2019 and Dr. Drucilla Schmidt did not feel like that this was infected. Subsequently he states that he feels like the issue is more 1 of not being at maximal diuresis and recommended the patient see his cardiologist to ensure that this was achieved. Subsequently Dr. Drucilla Schmidt states this is a issue with venous stasis dermatitis which again I completely understand that that may definitely be the case but unfortunately we've not been able to get things under control even with maximum compression at a 4 layer compression wrap. The patient is still having significant issues. Either way I do think the patient is getting need to follow-up with his cardiologist to see what we can do Dr. Drucilla Schmidt did make mention that the patient had a flareup of infection that Keflex 500 mg 4 times a day or Augmentin twice a day would be good options for nonpurulent cellulitis. The patient was  again supposed to have gone to the hospital on the weekend following Christmas after he got things settled with family as far as getting someone to take care of his wife. With that being said he tells me that when it came around to that time he just didn't think about it. When  asked him about why the drainage from his leg and the issues he is having that and remind him he told me "well it really hasn't changed". 11/29/2019 on evaluation today patient presents for follow-up concerning his left lower extremity edema and weeping. He did see his cardiologist at my request after I discussed with him the situation as well. This is [name] who is a family Designer, jewellery that he saw. Subsequently the patient was recommended to start metolazone 5 mg daily for the next 5 days. They also recommended that really he needed essentially bed rest with elevation of his legs over the next week. With that being said the patient states that he is unable to even get in bed due to the fact that he has back trouble and has to sleep in his recliner. He never really gets in the bed at all. This is probably part of the issue coupled with his heart not functioning quite as well as what we would like to see and then subsequently he also has the issue of being overweight which contributes to this as well. Overall were still trying to figure out how to best manage this to get his tweaking under control and prevent anything from worsening. Again Dr. Drucilla Schmidt really did not feel like there was any evidence of infectious processes going on at this point. 12/06/2019 on evaluation today patient's leg actually appears to be doing better with regard to edema control I am actually pleased in this regard. He also seems to be improving as far as drainage is concerned from the leg this is much better than what it was in the past. Fortunately there is no signs of active infection which is good news and overall the patient states he is  actually happier with where things stand at this point. 12/13/2019 on evaluation today patient unfortunately is very dry with cracked skin noted at this point. Fortunately there is no signs of active infection at this time. With that being said I think he is really needs some moisturizing lotion something really good to counter loosen up the dry cracked skin I think even if 40% urea cream would be ideal at this point. I discussed this with him today. In fact even look this up on his phone so that he can order it if he needs to although he may be able to find it locally I am just not sure. I told him to check with his pharmacy first. 12/20/2019 on evaluation today patient unfortunately continues to have issues with swelling of his left lower extremity. We did use Tubigrip last week to try to give him a chance to be able to take this off and take a shower and then reapply it. With that being said he just cut it off and then did not have anything to reapply he did that on Friday after we saw him on Thursday and had nothing on in the interim for almost an entire week since. Obviously that was not the direction that was given to him and when I asked him about it he stated that he just could not bend over to get off and he does not have anyone to help him. With that being said I do not think this is can be possible to have him take this off to take a shower simply due to the fact that again were not going to be able to keep the edema under control and he can get this off and back on it  hurts his back way too much. 12/27/2019 on evaluation today patient actually appears to be mostly dry in regard to his lower extremity wounds in general. Fortunately there is no signs of active infection at this time. No fever chills noted. I really do not see anything open at this point which is good news. He does have a lot of dry skin that is flaking off. With that being said I think that likely I recommend wrapping him 1 more  week and we can utilize the objective coating layer from the Profore wraps in order to prevent anything from sticking to the wound bed. After that I am hopeful we will be able to get him into the compression socks that he has he supposed to bring 1 of those with him next week he also is supposed to switch out and start using the new ones on his right leg as when he has is completely worn out. 01/10/2020 upon evaluation today patient appears to be doing poorly at this point in regard to his lower extremities on the left. He still continues to have significant weeping he actually took the wrap off 2 days ago because he stated "we were planning to take it off today anyway". He did not however put any compression stockings on but obviously was not part of the plan. Still he continues to have significant swelling I really do not know that this is going to make a significant improvement and less were able to keep him well compressed all the time and again also get his edema under good control. He was supposed to go back to see his cardiologist although I do not think that is actually been an appointment that he has made at this point that I can find anyway. He also is still taking the double dose of his Lasix but again I am not sure that that is doing the job here. 01/17/2020 upon evaluation today patient appears to be doing quite well with regard to his leg all things considered. This is better than last week. With that being said he still has significant edema and weeping unfortunately this is going to continue to be an issue. There is no signs of active infection. He tells me that he got a new prescription for torsemide. T be honest I was not quite sure that he had been prescribed that yet I thought he had been prescribed Lasix but either o way I think there may be a little bit of confusion here. I discussed this with the patient today as well. I also discussed with the patient the fact that I do believe he  is going to likely require additional follow-up with his cardiologist. I suggested he give them a call to make an appointment. He told me that he called them last week but he did not make an appointment. I am not exactly sure what he called in for or what he was looking for out of that. Nonetheless there does not appear to be any signs of active infection at this time. No fevers, chills, nausea, vomiting, or diarrhea. 01/24/2020 upon evaluation today patient appears to be doing poorly in regard to his left lower extremity. He has bright green almost teal colored drainage which is very consistent with likely Pseudomonas. He also has a Pseudomonas-like smell to the drainage noted at this point. Fortunately there is no signs of active systemic infection at this time. No fevers, chills, nausea, vomiting, or diarrhea. With that being said I explained to  the patient that this can change quickly that he can definitely become septic as a result of what we are seeing here if he does not treat this appropriately. With that being said I really think he needs to be in the ER as soon as possible in fact I really want him to go today he tells me there is no way he is likely to be able to get that set up in time. He takes care of his wife who is legally blind and I completely understand the constraints there nonetheless he is going to take care of himself if he is good to be around to take care of her. 01/31/2020 upon evaluation today patient unfortunately appears to be doing poorly yet again. There is no signs of systemic infection yet although that is a big concern for me at this point to be honest. No fevers, chills, nausea, vomiting, or diarrhea. With regard to the oral antibiotics at this point the patient does have a prescription for trazodone which I was under the impression he was taking. He states that he is not really sure that he is taking that on a regular basis or not. T be honest is on his list he tells me  that in his pill bottle he has set pills he takes every day and he is really unsure of exactly what he takes. He needs to go o back and have a look at that and try to figure things out. Either way if we were to place him on something like Cipro or Levaquin with him still being on trazodone that can prolong the QT interval which can be also dangerous. I am not willing to do that. The patient really needs to go to the hospital for in my opinion IV antibiotic therapy. He was positive for Pseudomonas and when he came in today his dressing was completely saturated with blue-green drainage consistent with Pseudomonas as well that I saw even before I saw the results of the culture and already knew what we were dealing with. ADMISSION 02/07/2020 This is a 69 year old man followed for a prolonged period of time at our sister clinic in [place]. When he was seen last week he was felt to have cellulitis with a greenish drainage and he was sent to the hospital. He has lymphedema chronic venous insufficiency and was being treated for a wound on his left lower leg. He is also a type II diabetic although the patient states he is not taking anything for his diabetes and does not monitor his blood sugar. He was admitted to [facility]. A discharge summary is not yet available but he was just discharged yesterday. In the hospital he was felt to have chronic or acute on chronic systolic heart failure. He was treated with IV Lasix. Felt his dry weight was 128 kg. He was aggressively changed to torsemide 40 twice daily. With regards to his lower extremity edema he was treated with an Haematologist. He was sent here because this was felt to be closer to his actual home which is in [facility]. I do not believe he received antibiotics. Other issues included paroxysmal atrial fibrillation and nonsustained V. tach with PVCs. He came in the clinic today with the Unna boots looking to be a mass. This was according to  our intake nurse 02/28/2020; the patient I saw once 3 weeks ago. He had severe edema likely secondary to chronic venous insufficiency and lymphedema with superficial wounds on his posterior lower  leg but marked stasis dermatitis. We put TCA and ketoconazole in his skin silver alginate and put him on compression. All that is the last we have seen of him. He tells me he took the wrap off 2 days ago i.e. it stayed on for 2-1/2 weeks. He also told us that he was in hospital however we could see no such hospitalization. 5/6; the patient was last here on 20 April for a nurse visit. He missed an appointment with [place] and then kept the wrap on till about a week ago when he was taken off at his primary doctor's office. The wound surface therefore is a lot larger. He has uncontrolled edema in the left leg. Electronic Signature(s) Signed: 03/20/2020 5:30:42 PM By: Linton Ham MD Entered By: Linton Ham on 03/20/2020 11:12:21 -------------------------------------------------------------------------------- Physical Exam Details Patient Name: Date of Service: REDMO [name], [name]. 03/20/2020 10:30 A [name] Medical Record Number: [id number] Patient Account Number: [id number] Date of Birth/Sex: Treating RN: 04/04/1951 (69 y.o. [name] Primary Care Provider: Aura Dials Other Clinician: Referring Provider: Treating Provider/Extender: Gabriel Carina in Treatment: 6 Constitutional Sitting or standing Blood Pressure is within target range for patient.. Pulse regular and within target range for patient.Marland Kitchen Respirations regular, non-labored and within target range.. Temperature is normal and within the target range for the patient.Marland Kitchen Appears in no distress. Respiratory work of breathing is normal. Cardiovascular Pedal pulses palpable on the left. Psychiatric appears at normal baseline. Notes Wound exam; continued weeping lymphedema coming out of the large de-epithelialized area which  is circumferential on the left lower leg. Fortunately there is no evidence of active infection. His pedal pulses are palpable. Electronic Signature(s) Signed: 03/20/2020 5:30:42 PM By: Linton Ham MD Entered By: Linton Ham on 03/20/2020 11:13:27 -------------------------------------------------------------------------------- Physician Orders Details Patient Name: Date of Service: REDMO [name], [name]. 03/20/2020 10:30 A [name] Medical Record Number: [id number] Patient Account Number: [id number] Date of Birth/Sex: Treating RN: [birth year] (69 y.o. [name] Primary Care Provider: Aura Dials Other Clinician: Referring Provider: Treating Provider/Extender: Gabriel Carina in Treatment: 6 Verbal / Phone Orders: No Diagnosis Coding ICD-10 Coding Code Description [phone] Non-pressure chronic ulcer of left calf limited to breakdown of skin I89.0 Lymphedema, not elsewhere classified I87.322 Chronic venous hypertension (idiopathic) with inflammation of left lower extremity L57.26 Acute systolic (congestive) heart failure Follow-up Appointments ppointment in 1 week. - Thursday Return A Nurse Visit: - Monday Dressing Change Frequency Do not change entire dressing for one week. - only dressing changes in clinic or when home health is approved then start for twice a week dressing changes. Wound Center once a week. Skin Barriers/Peri-Wound Care TCA Cream or Ointment - mixed with ketoconazole and barrier cream liberally entire left leg. Wound Cleansing May shower and wash wound with soap and water. - use a cast protector. Primary Wound Dressing Wound #1 Left,Circumferential Lower Leg Calcium Alginate with Silver Secondary Dressing Wound #1 Left,Circumferential Lower Leg Dry Gauze ABD pad Zetuvit or Kerramax Drawtex Carboflex Edema Control 4 layer compression: Left lower extremity Patient to wear own compression stockings - apply to right leg in the morning  and remove at night. Avoid standing for long periods of time Elevate legs to the level of the heart or above for 30 minutes daily and/or when sitting, a frequency of: - throughout the day. Additional Orders / Instructions Other: - Patient to apply antifungal cream lotrimin directly to buttock area then apply zinc cream barrier over the cream. Patient to  apply daily. Home Health dmit to [facility] - for twice a week dressing changes. Wound Center once a week. A Electronic Signature(s) Signed: 03/20/2020 5:30:42 PM By: Linton Ham MD Signed: 03/20/2020 5:34:26 PM By: Deon Pilling Entered By: Deon Pilling on 03/20/2020 11:10:25 -------------------------------------------------------------------------------- Problem List Details Patient Name: Date of Service: REDMO [name], [name]. 03/20/2020 10:30 A [name] Medical Record Number: [id number] Patient Account Number: [id number] Date of Birth/Sex: Treating RN: 1951/10/02 (69 y.o. [name] Primary Care Provider: Aura Dials Other Clinician: Referring Provider: Treating Provider/Extender: Gabriel Carina in Treatment: 6 Active Problems ICD-10 Encounter Code Description Active Date MDM Diagnosis [id number] Non-pressure chronic ulcer of left calf limited to breakdown of skin 02/07/2020 No Yes I89.0 Lymphedema, not elsewhere classified 02/07/2020 No Yes I87.322 Chronic venous hypertension (idiopathic) with inflammation of left lower 02/07/2020 No Yes extremity E31.54 Acute systolic (congestive) heart failure 02/07/2020 No Yes Inactive Problems Resolved Problems Electronic Signature(s) Signed: 03/20/2020 5:30:42 PM By: Linton Ham MD Entered By: Linton Ham on 03/20/2020 11:10:57 -------------------------------------------------------------------------------- Progress Note Details Patient Name: Date of Service: REDMO [name], [name]. 03/20/2020 10:30 A [name] Medical Record Number: [id number] Patient  Account Number: [id number] Date of Birth/Sex: Treating RN: [birth year] (69 y.o. [name] Primary Care Provider: Aura Dials Other Clinician: Referring Provider: Treating Provider/Extender: Gabriel Carina in Treatment: 6 Subjective History of Present Illness (HPI) [place] History of Present Illness (HPI) 09/27/2019 on evaluation today patient appears to be doing poorly in regard to his bilateral lower extremities he does have bilateral lower extremity lymphedema. With that being said he also has a history of diabetes type 2, hypertension, congestive heart failure, and apparently has chronic venous insufficiency which has led to the lymphedema. Currently he also shows signs of an infection of the left lower extremity which does have me concerned at this point as well. He tells me that he is not really having any significant pain but has had a lot of trouble with his legs in general. He does have a compression stocking he is wearing on the right lower extremity he was wrapped with an Unna boot on the left lower extremity. The patient tells me that overall he has been doing with this for several weeks and home health has been initiated. He has not been on any antibiotics yet and he tells me that the home health nurse was not sure that it was infected based on what I see today I am concerned that this likely is infected at this point. Fortunately however there does not appear to be any evidence of systemic infection which is good news. The Unna boot does seem to be helping control the edema although he may benefit from a stronger compression wrap I think that at this point without having the ABIs completed the Unna boot is where I would stand. He is supposed to be having an arterial study with ABI as ordered by his cardiologist but he states he may have missed that appointment and has not called to reschedule he needs to contact them to get this rescheduled as soon as  possible I explained to him. He tells me that he is draining a lot as far as the left lower extremity is concerned and that is something that he feels like is not lasting very long as far as the wraps are concerned when they put him on they seem to get wet extremely quickly. 10/04/2019 on evaluation today patient appears to be doing  better compared to last week's evaluation. Fortunately there is no signs of active infection at this time. He has been tolerating the dressing changes without complication. He did undergo arterial studies yesterday and has an appointment tomorrow with the vascular specialist. With that being said his ABI on the left was 1.25 on the right 1.19 with a TBI of 0.61 on the left and 0.69 on the right. There was stated to be no evidence of obvious significant arterial disease and the patient had pretty much triphasic blood flow throughout. Overall I am very pleased with how things seem to be progressing. We will see if the vascular doctor says anything different tomorrow but I even at this point I feel like he would do well with a stronger compression wrap to try to get some the fluid out of his leg at this point. The good news is his drainage has slowed down considerably I do believe the antibiotics have been beneficial for him. 10/18/2019 on evaluation today patient appears to be doing poorly in regard to his left lower extremity. He still having a lot of drainage and weeping from his leg this appears to be much more erythematous and in fact I am concerned that the erythema may be spreading up his leg as well. He has not experienced any fevers, chills, nausea, vomiting, or diarrhea up to this point. I explained to him that I really feel like based on what I am seeing he may need to potentially go to the hospital for evaluation and treatment potentially even with IV antibiotics depending on what they see. With that being said he tells me that he is not really able to do that due to  the fact that he is the primary caregiver for his wife who is legally blind. He states he is not good to be able to have anything to help as far as caring for her if he were to go into the hospital. Nonetheless he is unsure what to do in this regard. I really think that he needs faster treatment. For that reason I am going to likely try to get in touch with the infectious disease office to see if I can get him in sooner than [place]. 10/25/2019 on evaluation today patient appears to be doing a little better in regard to his weeping and edema compared to the last time I saw him. The dorsal surface of his foot is a little bit drier compared to what was this is good news. He did see Dr. Drucilla Schmidt on 10/22/2019. Dr. Drucilla Schmidt did feel that potentially this could be secondary to some cellulitis and he recommended subsequently placing the patient on Zyvox which the patient feels like has helped as well. There does not appear to be any signs of active infection at this time which is good news systemically. I am very pleased that Dr. Drucilla Schmidt was able to see him so quickly I do appreciate this greatly. With that being said he also did recommend an MRI for the patient to evaluate for any deeper signs of infection. That is actually scheduled for this coming Tuesday. He also has a follow-up appoint with Dr. Drucilla Schmidt on Tuesday. The last thing Dr. Drucilla Schmidt did mention was the possibility of more aggressive diuresis. The patient is on torsemide. With that being said this is managed by his cardiologist. I think we may want to get in touch with her clinic and see if possibly this is something that could be increased for a short amount of  time depending on their recommendations to see if we can get some additional fluid off of him which in turn would likely help tremendously with his weeping and edema. He is in agreement with this plan if his cardiologist is in agreement. 11/01/2019 on evaluation today patient unfortunately  notes that he did not get his MRI done which was ordered by Dr. Drucilla Schmidt. Apparently when he went in it was 6:30 in the morning and they told him that he was there to get an MRI of his toes. With that being said the MRI was ordered of his ankle and foot and so long story short the patient said that he was not getting MRI of his toes and left. Dr. Drucilla Schmidt was alerted and again it does not appear that at all that was what he ordered that he did order the ankle and foot but nonetheless that is beside the point at this time. The patient still has considerable edema noted at this time the nurse that came out last after just put a Kerlix and Coban wrap on the patient she did not even properly wrap him. Subsequently they have also discharged him being that they state he is not skilled at this point. Therefore he is going to have to be seen here in the office only. Fortunately there is no signs of systemic infection he still has a lot of erythema around the ankle and lower extremity region again there are any specific open wounds just general weeping and evidence of erythema which Dr. Drucilla Schmidt is questioning whether or not this is even infection or if it is just more secondary to the patient's lymphedema. He has had venous reflux studies which were negative for any abnormal findings unfortunately that is also not good to be an answer for [place] here. I have recommended the patient today contact his primary care provider to see if they can increase his fluid pills and I also think we need to increase his compression wrap to a 4-layer compression wrap to try to more effectively manage his edema. 11/08/2019 on evaluation today patient appears unfortunately to still be doing poorly. I did speak with his primary care provider office last week they were supposed to call him to advise what should be done as far as his fluid pills were concerned. With that being said they did not contact him as far as the patient tells me. I am  not really sure exactly what is going on as far as that is concerned. Nonetheless I really think that the patient needs to be admitted to the hospital for treatment likely as I explained to him he is going to require IV Lasix to get fluid off and to be honest also think he may need IV antibiotics. I have made all the outpatient referrals I can to try to get things under control he has been on oral antibiotics I really just have not seen any significant improvement he was even on linezolid. That was prescribed By infectious disease. Overall I am very concerned about the fact that this is not getting better. 11/22/2019 on evaluation today patient unfortunately is not doing very well with regard to his lower extremity edema. The left lower extremity is significantly swollen and still significantly erythematous. He has bright green drainage. He did just see Dr. Drucilla Schmidt on 11/19/2019 and Dr. Drucilla Schmidt did not feel like that this was infected. Subsequently he states that he feels like the issue is more 1 of not being at maximal diuresis and  recommended the patient see his cardiologist to ensure that this was achieved. Subsequently Dr. Drucilla Schmidt states this is a issue with venous stasis dermatitis which again I completely understand that that may definitely be the case but unfortunately we've not been able to get things under control even with maximum compression at a 4 layer compression wrap. The patient is still having significant issues. Either way I do think the patient is getting need to follow-up with his cardiologist to see what we can do Dr. Drucilla Schmidt did make mention that the patient had a flareup of infection that Keflex 500 mg 4 times a day or Augmentin twice a day would be good options for nonpurulent cellulitis. The patient was again supposed to have gone to the hospital on the weekend following Christmas after he got things settled with family as far as getting someone to take care of his wife. With that being  said he tells me that when it came around to that time he just didn't think about it. When asked him about why the drainage from his leg and the issues he is having that and remind him he told me "well it really hasn't changed". 11/29/2019 on evaluation today patient presents for follow-up concerning his left lower extremity edema and weeping. He did see his cardiologist at my request after I discussed with him the situation as well. This is [name] who is a family Designer, jewellery that he saw. Subsequently the patient was recommended to start metolazone 5 mg daily for the next 5 days. They also recommended that really he needed essentially bed rest with elevation of his legs over the next week. With that being said the patient states that he is unable to even get in bed due to the fact that he has back trouble and has to sleep in his recliner. He never really gets in the bed at all. This is probably part of the issue coupled with his heart not functioning quite as well as what we would like to see and then subsequently he also has the issue of being overweight which contributes to this as well. Overall were still trying to figure out how to best manage this to get his tweaking under control and prevent anything from worsening. Again Dr. Drucilla Schmidt really did not feel like there was any evidence of infectious processes going on at this point. 12/06/2019 on evaluation today patient's leg actually appears to be doing better with regard to edema control I am actually pleased in this regard. He also seems to be improving as far as drainage is concerned from the leg this is much better than what it was in the past. Fortunately there is no signs of active infection which is good news and overall the patient states he is actually happier with where things stand at this point. 12/13/2019 on evaluation today patient unfortunately is very dry with cracked skin noted at this point. Fortunately there is no signs of  active infection at this time. With that being said I think he is really needs some moisturizing lotion something really good to counter loosen up the dry cracked skin I think even if 40% urea cream would be ideal at this point. I discussed this with him today. In fact even look this up on his phone so that he can order it if he needs to although he may be able to find it locally I am just not sure. I told him to check with his pharmacy first. 12/20/2019 on  evaluation today patient unfortunately continues to have issues with swelling of his left lower extremity. We did use Tubigrip last week to try to give him a chance to be able to take this off and take a shower and then reapply it. With that being said he just cut it off and then did not have anything to reapply he did that on Friday after we saw him on Thursday and had nothing on in the interim for almost an entire week since. Obviously that was not the direction that was given to him and when I asked him about it he stated that he just could not bend over to get off and he does not have anyone to help him. With that being said I do not think this is can be possible to have him take this off to take a shower simply due to the fact that again were not going to be able to keep the edema under control and he can get this off and back on it hurts his back way too much. 12/27/2019 on evaluation today patient actually appears to be mostly dry in regard to his lower extremity wounds in general. Fortunately there is no signs of active infection at this time. No fever chills noted. I really do not see anything open at this point which is good news. He does have a lot of dry skin that is flaking off. With that being said I think that likely I recommend wrapping him 1 more week and we can utilize the objective coating layer from the Profore wraps in order to prevent anything from sticking to the wound bed. After that I am hopeful we will be able to get him into  the compression socks that he has he supposed to bring 1 of those with him next week he also is supposed to switch out and start using the new ones on his right leg as when he has is completely worn out. 01/10/2020 upon evaluation today patient appears to be doing poorly at this point in regard to his lower extremities on the left. He still continues to have significant weeping he actually took the wrap off 2 days ago because he stated "we were planning to take it off today anyway". He did not however put any compression stockings on but obviously was not part of the plan. Still he continues to have significant swelling I really do not know that this is going to make a significant improvement and less were able to keep him well compressed all the time and again also get his edema under good control. He was supposed to go back to see his cardiologist although I do not think that is actually been an appointment that he has made at this point that I can find anyway. He also is still taking the double dose of his Lasix but again I am not sure that that is doing the job here. 01/17/2020 upon evaluation today patient appears to be doing quite well with regard to his leg all things considered. This is better than last week. With that being said he still has significant edema and weeping unfortunately this is going to continue to be an issue. There is no signs of active infection. He tells me that he got a new prescription for torsemide. T be honest I was not quite sure that he had been prescribed that yet I thought he had been prescribed Lasix but either o way I think there may be a little bit  of confusion here. I discussed this with the patient today as well. I also discussed with the patient the fact that I do believe he is going to likely require additional follow-up with his cardiologist. I suggested he give them a call to make an appointment. He told me that he called them last week but he did not make an  appointment. I am not exactly sure what he called in for or what he was looking for out of that. Nonetheless there does not appear to be any signs of active infection at this time. No fevers, chills, nausea, vomiting, or diarrhea. 01/24/2020 upon evaluation today patient appears to be doing poorly in regard to his left lower extremity. He has bright green almost teal colored drainage which is very consistent with likely Pseudomonas. He also has a Pseudomonas-like smell to the drainage noted at this point. Fortunately there is no signs of active systemic infection at this time. No fevers, chills, nausea, vomiting, or diarrhea. With that being said I explained to the patient that this can change quickly that he can definitely become septic as a result of what we are seeing here if he does not treat this appropriately. With that being said I really think he needs to be in the ER as soon as possible in fact I really want him to go today he tells me there is no way he is likely to be able to get that set up in time. He takes care of his wife who is legally blind and I completely understand the constraints there nonetheless he is going to take care of himself if he is good to be around to take care of her. 01/31/2020 upon evaluation today patient unfortunately appears to be doing poorly yet again. There is no signs of systemic infection yet although that is a big concern for me at this point to be honest. No fevers, chills, nausea, vomiting, or diarrhea. With regard to the oral antibiotics at this point the patient does have a prescription for trazodone which I was under the impression he was taking. He states that he is not really sure that he is taking that on a regular basis or not. T be honest is on his list he tells me that in his pill bottle he has set pills he takes every day and he is really unsure of exactly what he takes. He needs to go o back and have a look at that and try to figure things out.  Either way if we were to place him on something like Cipro or Levaquin with him still being on trazodone that can prolong the QT interval which can be also dangerous. I am not willing to do that. The patient really needs to go to the hospital for in my opinion IV antibiotic therapy. He was positive for Pseudomonas and when he came in today his dressing was completely saturated with blue-green drainage consistent with Pseudomonas as well that I saw even before I saw the results of the culture and already knew what we were dealing with. ADMISSION 02/07/2020 This is a 69 year old man followed for a prolonged period of time at our sister clinic in [place]. When he was seen last week he was felt to have cellulitis with a greenish drainage and he was sent to the hospital. He has lymphedema chronic venous insufficiency and was being treated for a wound on his left lower leg. He is also a type II diabetic although the patient states he is  not taking anything for his diabetes and does not monitor his blood sugar. He was admitted to [facility]. A discharge summary is not yet available but he was just discharged yesterday. In the hospital he was felt to have chronic or acute on chronic systolic heart failure. He was treated with IV Lasix. Felt his dry weight was 128 kg. He was aggressively changed to torsemide 40 twice daily. With regards to his lower extremity edema he was treated with an Haematologist. He was sent here because this was felt to be closer to his actual home which is in [facility] I do not believe he received antibiotics. Other issues included paroxysmal atrial fibrillation and nonsustained V. tach with PVCs. He came in the clinic today with the Unna boots looking to be a mass. This was according to our intake nurse 02/28/2020; the patient I saw once 3 weeks ago. He had severe edema likely secondary to chronic venous insufficiency and lymphedema with superficial wounds on his  posterior lower leg but marked stasis dermatitis. We put TCA and ketoconazole in his skin silver alginate and put him on compression. All that is the last we have seen of him. He tells me he took the wrap off 2 days ago i.e. it stayed on for 2-1/2 weeks. He also told us that he was in hospital however we could see no such hospitalization. 5/6; the patient was last here on 20 April for a nurse visit. He missed an appointment with [place] and then kept the wrap on till about a week ago when he was taken off at his primary doctor's office. The wound surface therefore is a lot larger. He has uncontrolled edema in the left leg. Objective Constitutional Sitting or standing Blood Pressure is within target range for patient.. Pulse regular and within target range for patient.Marland Kitchen Respirations regular, non-labored and within target range.. Temperature is normal and within the target range for the patient.Marland Kitchen Appears in no distress. Vitals Time Taken: 10:25 AM, Height: 69 in, Weight: 280 lbs, BMI: 41.3, Temperature: 98.1 F, Pulse: 79 bpm, Respiratory Rate: 20 breaths/min, Blood Pressure: 132/62 mmHg. Respiratory work of breathing is normal. Cardiovascular Pedal pulses palpable on the left. Psychiatric appears at normal baseline. General Notes: Wound exam; continued weeping lymphedema coming out of the large de-epithelialized area which is circumferential on the left lower leg. Fortunately there is no evidence of active infection. His pedal pulses are palpable. Integumentary (Hair, Skin) Wound #1 status is Open. Original cause of wound was Gradually Appeared. The wound is located on the Left,Circumferential Lower Leg. The wound measures 27cm length x 41.5cm width x 0.1cm depth; 880.039cm^2 area and 88.004cm^3 volume. Assessment Active Problems ICD-10 Non-pressure chronic ulcer of left calf limited to breakdown of skin Lymphedema, not elsewhere classified Chronic venous hypertension (idiopathic) with  inflammation of left lower extremity Acute systolic (congestive) heart failure Procedures Wound #1 Pre-procedure diagnosis of Wound #1 is a Lymphedema located on the Left,Circumferential Lower Leg . There was a Four Layer Compression Therapy Procedure by Baruch Gouty, RN. Post procedure Diagnosis Wound #1: Same as Pre-Procedure Plan Follow-up Appointments: Return Appointment in 1 week. - Thursday Nurse Visit: - Monday Dressing Change Frequency: Do not change entire dressing for one week. - only dressing changes in clinic or when home health is approved then start for twice a week dressing changes. Wound Center once a week. Skin Barriers/Peri-Wound Care: TCA Cream or Ointment - mixed with ketoconazole and barrier cream liberally entire left leg. Wound  Cleansing: May shower and wash wound with soap and water. - use a cast protector. Primary Wound Dressing: Wound #1 Left,Circumferential Lower Leg: Calcium Alginate with Silver Secondary Dressing: Wound #1 Left,Circumferential Lower Leg: Dry Gauze ABD pad Zetuvit or Kerramax Drawtex Carboflex Edema Control: 4 layer compression: Left lower extremity Patient to wear own compression stockings - apply to right leg in the morning and remove at night. Avoid standing for long periods of time Elevate legs to the level of the heart or above for 30 minutes daily and/or when sitting, a frequency of: - throughout the day. Additional Orders / Instructions: Other: - Patient to apply antifungal cream lotrimin directly to buttock area then apply zinc cream barrier over the cream. Patient to apply daily. Home Health: Admit to [facility] - for twice a week dressing changes. Wound Center once a week. 1. We applied TCA mixed with ketoconazole and zinc oxide to the surface of the wound 2. Silver alginate/Kerramax/ABDs under 4-layer compression 3. The patient has [place] [facility] which is notoriously difficult these days for home  health. I would like to get this changed once a week by home health before I see him again next week for a dressing change. If we cannot get home health then will bring him back for a nurse visit on Monday. Electronic Signature(s) Signed: 03/20/2020 5:30:42 PM By: Linton Ham MD Entered By: Linton Ham on 03/20/2020 11:14:37 -------------------------------------------------------------------------------- SuperBill Details Patient Name: Date of Service: REDMO [name], [name]. 03/20/2020 Medical Record Number: [id number] Patient Account Number: [id number] Date of Birth/Sex: Treating RN: 04-May-1951 (69 y.o. [name] Primary Care Provider: Aura Dials Other Clinician: Referring Provider: Treating Provider/Extender: Gabriel Carina in Treatment: 6 Diagnosis Coding ICD-10 Codes Code Description [phone] Non-pressure chronic ulcer of left calf limited to breakdown of skin I89.0 Lymphedema, not elsewhere classified I87.322 Chronic venous hypertension (idiopathic) with inflammation of left lower extremity M09.47 Acute systolic (congestive) heart failure Facility Procedures The patient participates with Medicare or their insurance follows the Medicare Facility Guidelines: CPT4 Code Description Modifier Quantity [id number] (Facility Use Only) [phone] - [facility] Physician Procedures Electronic Signature(s) Signed: 03/20/2020 5:30:42 PM By: Linton Ham MD Entered By: Linton Ham on 03/20/2020 11:15:14

## 2020-03-24 ENCOUNTER — Encounter (HOSPITAL_BASED_OUTPATIENT_CLINIC_OR_DEPARTMENT_OTHER): Payer: Medicare Other | Admitting: Internal Medicine

## 2020-03-27 ENCOUNTER — Encounter (HOSPITAL_BASED_OUTPATIENT_CLINIC_OR_DEPARTMENT_OTHER): Payer: Medicare Other | Admitting: Internal Medicine

## 2020-03-27 ENCOUNTER — Other Ambulatory Visit: Payer: Self-pay

## 2020-03-27 DIAGNOSIS — L97221 Non-pressure chronic ulcer of left calf limited to breakdown of skin: Secondary | ICD-10-CM | POA: Diagnosis not present

## 2020-03-29 NOTE — Progress Notes (Signed)
HEBERTO, STURDEVANT ([id number]) Visit Report for 03/27/2020 SuperBill Details Patient Name: Date of Service: REDMO NDLeslee, Haueter 03/27/2020 Medical Record Number: [id number] Patient Account Number: [id number] Date of Birth/Sex: Treating RN: May 07, 1951 (69 y.o. [name] Primary Care Provider: Aura Dials Other Clinician: Referring Provider: Treating Provider/Extender: Gabriel Carina in Treatment: 7 Diagnosis Coding ICD-10 Codes Code Description [phone] Non-pressure chronic ulcer of left calf limited to breakdown of skin I89.0 Lymphedema, not elsewhere classified I87.322 Chronic venous hypertension (idiopathic) with inflammation of left lower extremity M46.80 Acute systolic (congestive) heart failure Facility Procedures The patient participates with Medicare or their insurance follows the Medicare Facility Guidelines CPT4 Code Description Modifier Quantity [id number] (Facility Use Only) [phone] - APPLY MULTLAY COMPRS LWR LT LEG 1 Electronic Signature(s) Signed: 03/27/2020 12:21:39 PM By: Deon Pilling Signed: 03/29/2020 8:06:24 AM By: Linton Ham MD Entered By: Deon Pilling on 03/27/2020 08:17:18

## 2020-03-31 ENCOUNTER — Ambulatory Visit (HOSPITAL_BASED_OUTPATIENT_CLINIC_OR_DEPARTMENT_OTHER): Payer: Medicare Other | Admitting: Internal Medicine

## 2020-04-03 ENCOUNTER — Encounter (HOSPITAL_BASED_OUTPATIENT_CLINIC_OR_DEPARTMENT_OTHER): Payer: Medicare Other | Admitting: Internal Medicine

## 2020-04-07 NOTE — Progress Notes (Signed)
[name], [name] ([id number]) Visit Report for 03/20/2020 Arrival Information Details Patient Name: Date of Service: REDMO NDAnand, [name]. 03/20/2020 10:30 A [name] Medical Record Number: [id number] Patient Account Number: [id number] Date of Birth/Sex: Treating RN: [birth year] (69 y.o. [name] Primary Care Kaitlynd Phillips: Aura Dials Other Clinician: Referring Nakeya Adinolfi: Treating Chanah Tidmore/Extender: Gabriel Carina in Treatment: 6 Visit Information History Since Last Visit Added or deleted any medications: No Patient Arrived: Ambulatory Any new allergies or adverse reactions: No Arrival Time: 10:20 Had a fall or experienced change in No Accompanied By: self activities of daily living that may affect Transfer Assistance: None risk of falls: Patient Identification Verified: Yes Signs or symptoms of abuse/neglect since last visito No Secondary Verification Process Completed: Yes Hospitalized since last visit: No Patient Has Alerts: Yes Implantable device outside of the clinic excluding No Patient Alerts: Patient on Blood Thinner cellular tissue based products placed in the center ABI, BIL: non compress since last visit: Has Dressing in Place as Prescribed: Yes Pain Present Now: Yes Electronic Signature(s) Signed: 03/25/2020 8:52:48 AM By: Sandre Kitty Entered By: Sandre Kitty on 03/20/2020 10:25:36 -------------------------------------------------------------------------------- Compression Therapy Details Patient Name: Date of Service: [name], [name]. 03/20/2020 10:30 A [name] Medical Record Number: [id number] Patient Account Number: [id number] Date of Birth/Sex: Treating RN: Sep 09, 1951 (69 y.o. [name] Primary Care Bernardo Brayman: Aura Dials Other Clinician: Referring Carina Chaplin: Treating Brynden Thune/Extender: Gabriel Carina in Treatment: 6 Compression Therapy Performed for Wound Assessment: Wound #1 Left,Circumferential Lower  Leg Performed By: Clinician Baruch Gouty, RN Compression Type: Four Layer Post Procedure Diagnosis Same as Pre-procedure Electronic Signature(s) Signed: 03/20/2020 5:34:26 PM By: Deon Pilling Entered By: Deon Pilling on 03/20/2020 11:13:12 -------------------------------------------------------------------------------- Encounter Discharge Information Details Patient Name: Date of Service: [name], [name]. 03/20/2020 10:30 A [name] Medical Record Number: [id number] Patient Account Number: [id number] Date of Birth/Sex: Treating RN: May 01, 1951 (69 y.o. [name] Primary Care Rebecca Motta: Aura Dials Other Clinician: Referring Larnie Heart: Treating Adeola Dennen/Extender: Gabriel Carina in Treatment: 6 Encounter Discharge Information Items Discharge Condition: Stable Ambulatory Status: Ambulatory Discharge Destination: Home Transportation: Private Auto Accompanied By: self Schedule Follow-up Appointment: Yes Clinical Summary of Care: Patient Declined Electronic Signature(s) Signed: 03/20/2020 5:01:19 PM By: Baruch Gouty RN, BSN Entered By: Baruch Gouty on 03/20/2020 11:35:05 -------------------------------------------------------------------------------- Lower Extremity Assessment Details Patient Name: Date of Service: [name], [name]. 03/20/2020 10:30 A [name] Medical Record Number: [id number] Patient Account Number: [id number] Date of Birth/Sex: Treating RN: 01-04-1951 (68 y.o. [name]) Carlene Coria Primary Care Fuad Forget: Aura Dials Other Clinician: Referring Whit Bruni: Treating Shayden Bobier/Extender: Gabriel Carina in Treatment: 6 Edema Assessment Assessed: Shirlyn Goltz: No] [Right: No] Edema: [Left: Yes] [Right: Yes] Calf Left: Right: Point of Measurement: 42 cm From Medial Instep 55 cm 47 cm Ankle Left: Right: Point of Measurement: 17 cm From Medial Instep 32 cm 31 cm Electronic Signature(s) Signed: 03/20/2020 5:04:33 PM By: Carlene Coria RN Entered By: Carlene Coria on 03/20/2020 10:48:10 -------------------------------------------------------------------------------- Multi Wound Chart Details Patient Name: Date of Service: [name], [name]. 03/20/2020 10:30 A [name] Medical Record Number: [id number] Patient Account Number: [id number] Date of Birth/Sex: Treating RN: Dec 29, 1950 (69 y.o. [name] Primary Care Shaquandra Galano: Aura Dials Other Clinician: Referring Seferina Brokaw: Treating Lakai Moree/Extender: Gabriel Carina in Treatment: 6 Vital Signs Height(in): 69 Pulse(bpm): 71 Weight(lbs): 280 Blood Pressure(mmHg): 132/62 Body Mass Index(BMI): 41 Temperature(F): 98.1 Respiratory Rate(breaths/min): 20 Photos: [1:No Photos Left, Circumferential Lower Leg] [N/A:N/A N/A] Wound Location: [1:Gradually  Appeared] [N/A:N/A] Wounding Event: [1:Lymphedema] [N/A:N/A] Primary Etiology: [1:07/18/2019] [N/A:N/A] Date Acquired: [1:6] [N/A:N/A] Weeks of Treatment: [1:Open] [N/A:N/A] Wound Status: [1:27x41.5x0.1] [N/A:N/A] Measurements L x W x D (cm) [1:880.039] [N/A:N/A] A (cm) : rea [1:88.004] [N/A:N/A] Volume (cm) : [1:-169.40%] [N/A:N/A] % Reduction in A rea: [1:-169.30%] [N/A:N/A] % Reduction in Volume: [1:Full Thickness Without Exposed] [N/A:N/A] Classification: [1:Support Structures] Treatment Notes Electronic Signature(s) Signed: 03/20/2020 5:30:42 PM By: Linton Ham MD Signed: 04/07/2020 1:34:25 PM By: Deon Pilling Entered By: Linton Ham on 03/20/2020 11:11:05 -------------------------------------------------------------------------------- Multi-Disciplinary Care Plan Details Patient Name: Date of Service: [name], [name]. 03/20/2020 10:30 A [name] Medical Record Number: [id number] Patient Account Number: [id number] Date of Birth/Sex: Treating RN: 23-Jul-1951 (69 y.o. [name] Primary Care Barb Shear: Aura Dials Other Clinician: Referring Heith Haigler: Treating  Jaiyanna Safran/Extender: Gabriel Carina in Treatment: 6 Active Inactive Wound/Skin Impairment Nursing Diagnoses: Knowledge deficit related to ulceration/compromised skin integrity Goals: Patient/caregiver will verbalize understanding of skin care regimen Date Initiated: 02/28/2020 Target Resolution Date: 04/11/2020 Goal Status: Active Interventions: Assess patient/caregiver ability to perform ulcer/skin care regimen upon admission and as needed Provide education on ulcer and skin care Treatment Activities: Skin care regimen initiated : 02/28/2020 Topical wound management initiated : 02/28/2020 Notes: Electronic Signature(s) Signed: 03/20/2020 5:34:26 PM By: Deon Pilling Signed: 04/07/2020 1:34:25 PM By: Deon Pilling Entered By: Deon Pilling on 03/20/2020 10:55:41 -------------------------------------------------------------------------------- Pain Assessment Details Patient Name: Date of Service: [name], Briston [name]. 03/20/2020 10:30 A [name] Medical Record Number: [id number] Patient Account Number: [id number] Date of Birth/Sex: Treating RN: [birth year] (69 y.o. [name] Primary Care Emanuel Campos: Aura Dials Other Clinician: Referring Meggan Dhaliwal: Treating Briyonna Omara/Extender: Gabriel Carina in Treatment: 6 Active Problems Location of Pain Severity and Description of Pain Patient Has Paino Yes Site Locations Rate the pain. Current Pain Level: 4 Pain Management and Medication Current Pain Management: Electronic Signature(s) Signed: 03/25/2020 8:52:48 AM By: Sandre Kitty Signed: 04/07/2020 1:34:25 PM By: Deon Pilling Entered By: Sandre Kitty on 03/20/2020 10:25:49 -------------------------------------------------------------------------------- Patient/Caregiver Education Details Patient Name: Date of Service: [name], [name] 5/6/2021andnbsp10:30 A [name] Medical Record Number: [id number] Patient Account Number: [id number] Date of  Birth/Gender: Treating RN: May 22, 1951 (69 y.o. [name] Primary Care Physician: Aura Dials Other Clinician: Referring Physician: Treating Physician/Extender: Gabriel Carina in Treatment: 6 Education Assessment Education Provided To: Patient Education Topics Provided Wound/Skin Impairment: Handouts: Skin Care Do's and Dont's Methods: Explain/Verbal Responses: Reinforcements needed Electronic Signature(s) Signed: 03/20/2020 5:34:26 PM By: Deon Pilling Entered By: Deon Pilling on 03/20/2020 10:55:53 -------------------------------------------------------------------------------- Wound Assessment Details Patient Name: Date of Service: [name], [name]. 03/20/2020 10:30 A [name] Medical Record Number: [id number] Patient Account Number: [id number] Date of Birth/Sex: Treating RN: 04/06/1951 (69 y.o. [name] Primary Care Rody Keadle: Aura Dials Other Clinician: Referring Achille Xiang: Treating Kaylin Schellenberg/Extender: Gabriel Carina in Treatment: 6 Wound Status Wound Number: 1 Primary Etiology: Lymphedema Wound Location: Left, Circumferential Lower Leg Wound Status: Open Wounding Event: Gradually Appeared Date Acquired: 07/18/2019 Weeks Of Treatment: 6 Clustered Wound: No Photos Photo Uploaded By: Mikeal Hawthorne on 03/21/2020 08:10:54 Wound Measurements Length: (cm) 27 Width: (cm) 41.5 Depth: (cm) 0.1 Area: (cm) 880.039 Volume: (cm) 88.004 % Reduction in Area: -169.4% % Reduction in Volume: -169.3% Wound Description Classification: Full Thickness Without Exposed Support Structur es Electronic Signature(s) Signed: 03/25/2020 8:52:48 AM By: Sandre Kitty Signed: 04/07/2020 1:34:25 PM By: Deon Pilling Entered By: Sandre Kitty on 03/20/2020 10:38:12 -------------------------------------------------------------------------------- Vitals Details Patient Name: Date of Service:  [name], Ricky [name]. 03/20/2020 10:30 A  [name] Medical Record Number: [id number] Patient Account Number: [id number] Date of Birth/Sex: Treating RN: [birth year] (69 y.o. [name] Primary Care Jonmichael Beadnell: Aura Dials Other Clinician: Referring Biannca Scantlin: Treating Owenn Rothermel/Extender: Gabriel Carina in Treatment: 6 Vital Signs Time Taken: 10:25 Temperature (F): 98.1 Height (in): 69 Pulse (bpm): 79 Weight (lbs): 280 Respiratory Rate (breaths/min): 20 Body Mass Index (BMI): 41.3 Blood Pressure (mmHg): 132/62 Reference Range: 80 - 120 mg / dl Electronic Signature(s) Signed: 03/25/2020 8:52:48 AM By: Sandre Kitty Entered By: Sandre Kitty on 03/20/2020 10:28:57

## 2020-04-07 NOTE — Progress Notes (Signed)
MAC, DOWDELL ([id number]) Visit Report for 03/27/2020 Arrival Information Details Patient Name: Date of Service: REDMO NDGarris, Melhorn. 03/27/2020 8:00 A M Medical Record Number: [id number] Patient Account Number: [id number] Date of Birth/Sex: Treating RN: 1951/11/13 (69 y.o. [name] Primary Care Lukisha Procida: Aura Dials Other Clinician: Referring Wendi Lastra: Treating Carsin Randazzo/Extender: Gabriel Carina in Treatment: 7 Visit Information History Since Last Visit Added or deleted any medications: No Patient Arrived: Ambulatory Any new allergies or adverse reactions: No Arrival Time: 08:00 Had a fall or experienced change in No Accompanied By: self activities of daily living that may affect Transfer Assistance: None risk of falls: Patient Identification Verified: Yes Signs or symptoms of abuse/neglect since last visito No Secondary Verification Process Completed: Yes Hospitalized since last visit: No Patient Requires Transmission-Based Precautions: No Implantable device outside of the clinic excluding No Patient Has Alerts: Yes cellular tissue based products placed in the center Patient Alerts: Patient on Blood Thinner since last visit: ABI, BIL: non compress Has Dressing in Place as Prescribed: Yes Has Compression in Place as Prescribed: Yes Pain Present Now: No Electronic Signature(s) Signed: 03/27/2020 12:21:39 PM By: Deon Pilling Entered By: Deon Pilling on 03/27/2020 08:04:36 -------------------------------------------------------------------------------- Compression Therapy Details Patient Name: Date of Service: [name], [name]. 03/27/2020 8:00 A M Medical Record Number: [id number] Patient Account Number: [id number] Date of Birth/Sex: Treating RN: [birth year] (69 y.o. [name] Primary Care Jahniyah Revere: Aura Dials Other Clinician: Referring Omero Kowal: Treating Gaberiel Youngblood/Extender: Gabriel Carina in Treatment:  7 Compression Therapy Performed for Wound Assessment: Wound #1 Left,Circumferential Lower Leg Performed By: Clinician Deon Pilling, RN Compression Type: Four Layer Electronic Signature(s) Signed: 03/27/2020 12:21:39 PM By: Deon Pilling Entered By: Deon Pilling on 03/27/2020 08:15:41 -------------------------------------------------------------------------------- Encounter Discharge Information Details Patient Name: Date of Service: [name], Bruce M. 03/27/2020 8:00 A M Medical Record Number: [id number] Patient Account Number: [id number] Date of Birth/Sex: Treating RN: [birth year] (69 y.o. [name] Primary Care Elzabeth Mcquerry: Aura Dials Other Clinician: Referring Pearlina Friedly: Treating Janese Radabaugh/Extender: Gabriel Carina in Treatment: 7 Encounter Discharge Information Items Discharge Condition: Stable Ambulatory Status: Ambulatory Discharge Destination: Home Transportation: Private Auto Accompanied By: self Schedule Follow-up Appointment: Yes Clinical Summary of Care: Electronic Signature(s) Signed: 03/27/2020 12:21:39 PM By: Deon Pilling Entered By: Deon Pilling on 03/27/2020 08:17:12 -------------------------------------------------------------------------------- Patient/Caregiver Education Details Patient Name: Date of Service: [name], [name]. 5/13/2021andnbsp8:00 A M Medical Record Number: [id number] Patient Account Number: [id number] Date of Birth/Gender: Treating RN: 10/23/1951 (69 y.o. [name] Primary Care Physician: Aura Dials Other Clinician: Referring Physician: Treating Physician/Extender: Gabriel Carina in Treatment: 7 Education Assessment Education Provided To: Patient Education Topics Provided Wound/Skin Impairment: Handouts: Skin Care Do's and Dont's Methods: Explain/Verbal Responses: Reinforcements needed Electronic Signature(s) Signed: 03/27/2020 12:21:39 PM By: Deon Pilling Entered  By: Deon Pilling on 03/27/2020 08:17:03 -------------------------------------------------------------------------------- Wound Assessment Details Patient Name: Date of Service: [name], [name]. 03/27/2020 8:00 A M Medical Record Number: [id number] Patient Account Number: [id number] Date of Birth/Sex: Treating RN: [birth year] (69 y.o. [name] Primary Care Kyrstyn Greear: Aura Dials Other Clinician: Referring Elliannah Wayment: Treating Ajene Carchi/Extender: Gabriel Carina in Treatment: 7 Wound Status Wound Number: 1 Primary Lymphedema Etiology: Wound Location: Left, Circumferential Lower Leg Wound Status: Open Wounding Event: Gradually Appeared Comorbid Arrhythmia, Congestive Heart Failure, Hypertension, Type Date Acquired: 07/18/2019 History: II Diabetes Weeks Of Treatment: 7 Clustered Wound: No Wound Measurements Length: (cm) 27 Width: (cm) 41.5 Depth: (cm) 0.1 Area: (cm)  880.039 Volume: (cm) 88.004 % Reduction in Area: -169.4% % Reduction in Volume: -169.3% Epithelialization: Small (1-33%) Tunneling: No Undermining: No Wound Description Classification: Full Thickness Without Exposed Support Structures Wound Margin: Distinct, outline attached Exudate Amount: Large Exudate Type: Serosanguineous Exudate Color: red, brown Foul Odor After Cleansing: No Slough/Fibrino No Wound Bed Granulation Amount: Large (67-100%) Exposed Structure Granulation Quality: Red Fascia Exposed: No Necrotic Amount: None Present (0%) Fat Layer (Subcutaneous Tissue) Exposed: Yes Tendon Exposed: No Muscle Exposed: No Joint Exposed: No Bone Exposed: No Treatment Notes Wound #1 (Left, Circumferential Lower Leg) 1. Cleanse With Wound Cleanser Soap and water 2. Periwound Care Antifungal cream Barrier cream TCA Cream 3. Primary Dressing Applied Calcium Alginate Ag 4. Secondary Dressing ABD Pad Dry Gauze Kerramax/Xtrasorb Other secondary dressing (specify in  notes) 6. Support Layer Applied 4 layer compression wrap Notes carboflex secondary. stockinette. Electronic Signature(s) Signed: 03/27/2020 12:21:39 PM By: Deon Pilling Signed: 04/07/2020 1:34:25 PM By: Deon Pilling Entered By: Deon Pilling on 03/27/2020 08:15:26 -------------------------------------------------------------------------------- Vitals Details Patient Name: Date of Service: [name], Zacharias M. 03/27/2020 8:00 A M Medical Record Number: [id number] Patient Account Number: [id number] Date of Birth/Sex: Treating RN: 11-01-1951 (69 y.o. [name] Primary Care Zehava Turski: Aura Dials Other Clinician: Referring Sharian Delia: Treating Ryin Schillo/Extender: Gabriel Carina in Treatment: 7 Vital Signs Time Taken: 08:00 Temperature (F): 98 Height (in): 69 Pulse (bpm): 75 Weight (lbs): 280 Respiratory Rate (breaths/min): 20 Body Mass Index (BMI): 41.3 Blood Pressure (mmHg): 103/75 Reference Range: 80 - 120 mg / dl Electronic Signature(s) Signed: 03/27/2020 12:21:39 PM By: Deon Pilling Entered By: Deon Pilling on 03/27/2020 08:05:05

## 2020-04-10 ENCOUNTER — Encounter (HOSPITAL_BASED_OUTPATIENT_CLINIC_OR_DEPARTMENT_OTHER): Payer: Medicare Other | Admitting: Internal Medicine

## 2020-04-10 DIAGNOSIS — L97221 Non-pressure chronic ulcer of left calf limited to breakdown of skin: Secondary | ICD-10-CM | POA: Diagnosis not present

## 2020-04-10 NOTE — Progress Notes (Signed)
DESTIN, VINSANT ([id number]) Visit Report for 04/10/2020 HPI Details Patient Name: Date of Service: REDMO NDDaymion, [name]. 04/10/2020 8:00 A M Medical Record Number: [id number] Patient Account Number: [id number] Date of Birth/Sex: Treating RN: 1951/07/01 (69 y.o. [name] Primary Care Provider: Aura Dials Other Clinician: Referring Provider: Treating Provider/Extender: Olean Ree in Treatment: 9 History of Present Illness HPI Description: [place] History of Present Illness (HPI) 09/27/2019 on evaluation today patient appears to be doing poorly in regard to his bilateral lower extremities he does have bilateral lower extremity lymphedema. With that being said he also has a history of diabetes type 2, hypertension, congestive heart failure, and apparently has chronic venous insufficiency which has led to the lymphedema. Currently he also shows signs of an infection of the left lower extremity which does have me concerned at this point as well. He tells me that he is not really having any significant pain but has had a lot of trouble with his legs in general. He does have a compression stocking he is wearing on the right lower extremity he was wrapped with an Unna boot on the left lower extremity. The patient tells me that overall he has been doing with this for several weeks and home health has been initiated. He has not been on any antibiotics yet and he tells me that the home health nurse was not sure that it was infected based on what I see today I am concerned that this likely is infected at this point. Fortunately however there does not appear to be any evidence of systemic infection which is good news. The Unna boot does seem to be helping control the edema although he may benefit from a stronger compression wrap I think that at this point without having the ABIs completed the Unna boot is where I would stand. He is supposed to be having an arterial study  with ABI as ordered by his cardiologist but he states he may have missed that appointment and has not called to reschedule he needs to contact them to get this rescheduled as soon as possible I explained to him. He tells me that he is draining a lot as far as the left lower extremity is concerned and that is something that he feels like is not lasting very long as far as the wraps are concerned when they put him on they seem to get wet extremely quickly. 10/04/2019 on evaluation today patient appears to be doing better compared to last week's evaluation. Fortunately there is no signs of active infection at this time. He has been tolerating the dressing changes without complication. He did undergo arterial studies yesterday and has an appointment tomorrow with the vascular specialist. With that being said his ABI on the left was 1.25 on the right 1.19 with a TBI of 0.61 on the left and 0.69 on the right. There was stated to be no evidence of obvious significant arterial disease and the patient had pretty much triphasic blood flow throughout. Overall I am very pleased with how things seem to be progressing. We will see if the vascular doctor says anything different tomorrow but I even at this point I feel like he would do well with a stronger compression wrap to try to get some the fluid out of his leg at this point. The good news is his drainage has slowed down considerably I do believe the antibiotics have been beneficial for him. 10/18/2019 on evaluation today patient appears to be doing  poorly in regard to his left lower extremity. He still having a lot of drainage and weeping from his leg this appears to be much more erythematous and in fact I am concerned that the erythema may be spreading up his leg as well. He has not experienced any fevers, chills, nausea, vomiting, or diarrhea up to this point. I explained to him that I really feel like based on what I am seeing he may need to potentially go to  the hospital for evaluation and treatment potentially even with IV antibiotics depending on what they see. With that being said he tells me that he is not really able to do that due to the fact that he is the primary caregiver for his wife who is legally blind. He states he is not good to be able to have anything to help as far as caring for her if he were to go into the hospital. Nonetheless he is unsure what to do in this regard. I really think that he needs faster treatment. For that reason I am going to likely try to get in touch with the infectious disease office to see if I can get him in sooner than [place]. 10/25/2019 on evaluation today patient appears to be doing a little better in regard to his weeping and edema compared to the last time I saw him. The dorsal surface of his foot is a little bit drier compared to what was this is good news. He did see Dr. Drucilla Schmidt on 10/22/2019. Dr. Drucilla Schmidt did feel that potentially this could be secondary to some cellulitis and he recommended subsequently placing the patient on Zyvox which the patient feels like has helped as well. There does not appear to be any signs of active infection at this time which is good news systemically. I am very pleased that Dr. Drucilla Schmidt was able to see him so quickly I do appreciate this greatly. With that being said he also did recommend an MRI for the patient to evaluate for any deeper signs of infection. That is actually scheduled for this coming Tuesday. He also has a follow-up appoint with Dr. Drucilla Schmidt on Tuesday. The last thing Dr. Drucilla Schmidt did mention was the possibility of more aggressive diuresis. The patient is on torsemide. With that being said this is managed by his cardiologist. I think we may want to get in touch with her clinic and see if possibly this is something that could be increased for a short amount of time depending on their recommendations to see if we can get some additional fluid off of him which in turn  would likely help tremendously with his weeping and edema. He is in agreement with this plan if his cardiologist is in agreement. 11/01/2019 on evaluation today patient unfortunately notes that he did not get his MRI done which was ordered by Dr. Drucilla Schmidt. Apparently when he went in it was 6:30 in the morning and they told him that he was there to get an MRI of his toes. With that being said the MRI was ordered of his ankle and foot and so long story short the patient said that he was not getting MRI of his toes and left. Dr. Drucilla Schmidt was alerted and again it does not appear that at all that was what he ordered that he did order the ankle and foot but nonetheless that is beside the point at this time. The patient still has considerable edema noted at this time the nurse that came out last after  just put a Kerlix and Coban wrap on the patient she did not even properly wrap him. Subsequently they have also discharged him being that they state he is not skilled at this point. Therefore he is going to have to be seen here in the office only. Fortunately there is no signs of systemic infection he still has a lot of erythema around the ankle and lower extremity region again there are any specific open wounds just general weeping and evidence of erythema which Dr. Drucilla Schmidt is questioning whether or not this is even infection or if it is just more secondary to the patient's lymphedema. He has had venous reflux studies which were negative for any abnormal findings unfortunately that is also not good to be an answer for [place] here. I have recommended the patient today contact his primary care provider to see if they can increase his fluid pills and I also think we need to increase his compression wrap to a 4-layer compression wrap to try to more effectively manage his edema. 11/08/2019 on evaluation today patient appears unfortunately to still be doing poorly. I did speak with his primary care provider office last week  they were supposed to call him to advise what should be done as far as his fluid pills were concerned. With that being said they did not contact him as far as the patient tells me. I am not really sure exactly what is going on as far as that is concerned. Nonetheless I really think that the patient needs to be admitted to the hospital for treatment likely as I explained to him he is going to require IV Lasix to get fluid off and to be honest also think he may need IV antibiotics. I have made all the outpatient referrals I can to try to get things under control he has been on oral antibiotics I really just have not seen any significant improvement he was even on linezolid. That was prescribed By infectious disease. Overall I am very concerned about the fact that this is not getting better. 11/22/2019 on evaluation today patient unfortunately is not doing very well with regard to his lower extremity edema. The left lower extremity is significantly swollen and still significantly erythematous. He has bright green drainage. He did just see Dr. Drucilla Schmidt on 11/19/2019 and Dr. Drucilla Schmidt did not feel like that this was infected. Subsequently he states that he feels like the issue is more 1 of not being at maximal diuresis and recommended the patient see his cardiologist to ensure that this was achieved. Subsequently Dr. Drucilla Schmidt states this is a issue with venous stasis dermatitis which again I completely understand that that may definitely be the case but unfortunately we've not been able to get things under control even with maximum compression at a 4 layer compression wrap. The patient is still having significant issues. Either way I do think the patient is getting need to follow-up with his cardiologist to see what we can do Dr. Drucilla Schmidt did make mention that the patient had a flareup of infection that Keflex 500 mg 4 times a day or Augmentin twice a day would be good options for nonpurulent cellulitis. The patient was  again supposed to have gone to the hospital on the weekend following Christmas after he got things settled with family as far as getting someone to take care of his wife. With that being said he tells me that when it came around to that time he just didn't think about it. When  asked him about why the drainage from his leg and the issues he is having that and remind him he told me "well it really hasn't changed". 11/29/2019 on evaluation today patient presents for follow-up concerning his left lower extremity edema and weeping. He did see his cardiologist at my request after I discussed with him the situation as well. This is Binnie Kand who is a family Designer, jewellery that he saw. Subsequently the patient was recommended to start metolazone 5 mg daily for the next 5 days. They also recommended that really he needed essentially bed rest with elevation of his legs over the next week. With that being said the patient states that he is unable to even get in bed due to the fact that he has back trouble and has to sleep in his recliner. He never really gets in the bed at all. This is probably part of the issue coupled with his heart not functioning quite as well as what we would like to see and then subsequently he also has the issue of being overweight which contributes to this as well. Overall were still trying to figure out how to best manage this to get his tweaking under control and prevent anything from worsening. Again Dr. Drucilla Schmidt really did not feel like there was any evidence of infectious processes going on at this point. 12/06/2019 on evaluation today patient's leg actually appears to be doing better with regard to edema control I am actually pleased in this regard. He also seems to be improving as far as drainage is concerned from the leg this is much better than what it was in the past. Fortunately there is no signs of active infection which is good news and overall the patient states he is  actually happier with where things stand at this point. 12/13/2019 on evaluation today patient unfortunately is very dry with cracked skin noted at this point. Fortunately there is no signs of active infection at this time. With that being said I think he is really needs some moisturizing lotion something really good to counter loosen up the dry cracked skin I think even if 40% urea cream would be ideal at this point. I discussed this with him today. In fact even look this up on his phone so that he can order it if he needs to although he may be able to find it locally I am just not sure. I told him to check with his pharmacy first. 12/20/2019 on evaluation today patient unfortunately continues to have issues with swelling of his left lower extremity. We did use Tubigrip last week to try to give him a chance to be able to take this off and take a shower and then reapply it. With that being said he just cut it off and then did not have anything to reapply he did that on Friday after we saw him on Thursday and had nothing on in the interim for almost an entire week since. Obviously that was not the direction that was given to him and when I asked him about it he stated that he just could not bend over to get off and he does not have anyone to help him. With that being said I do not think this is can be possible to have him take this off to take a shower simply due to the fact that again were not going to be able to keep the edema under control and he can get this off and back on it  hurts his back way too much. 12/27/2019 on evaluation today patient actually appears to be mostly dry in regard to his lower extremity wounds in general. Fortunately there is no signs of active infection at this time. No fever chills noted. I really do not see anything open at this point which is good news. He does have a lot of dry skin that is flaking off. With that being said I think that likely I recommend wrapping him 1 more  week and we can utilize the objective coating layer from the Profore wraps in order to prevent anything from sticking to the wound bed. After that I am hopeful we will be able to get him into the compression socks that he has he supposed to bring 1 of those with him next week he also is supposed to switch out and start using the new ones on his right leg as when he has is completely worn out. 01/10/2020 upon evaluation today patient appears to be doing poorly at this point in regard to his lower extremities on the left. He still continues to have significant weeping he actually took the wrap off 2 days ago because he stated "we were planning to take it off today anyway". He did not however put any compression stockings on but obviously was not part of the plan. Still he continues to have significant swelling I really do not know that this is going to make a significant improvement and less were able to keep him well compressed all the time and again also get his edema under good control. He was supposed to go back to see his cardiologist although I do not think that is actually been an appointment that he has made at this point that I can find anyway. He also is still taking the double dose of his Lasix but again I am not sure that that is doing the job here. 01/17/2020 upon evaluation today patient appears to be doing quite well with regard to his leg all things considered. This is better than last week. With that being said he still has significant edema and weeping unfortunately this is going to continue to be an issue. There is no signs of active infection. He tells me that he got a new prescription for torsemide. T be honest I was not quite sure that he had been prescribed that yet I thought he had been prescribed Lasix but either o way I think there may be a little bit of confusion here. I discussed this with the patient today as well. I also discussed with the patient the fact that I do believe he  is going to likely require additional follow-up with his cardiologist. I suggested he give them a call to make an appointment. He told me that he called them last week but he did not make an appointment. I am not exactly sure what he called in for or what he was looking for out of that. Nonetheless there does not appear to be any signs of active infection at this time. No fevers, chills, nausea, vomiting, or diarrhea. 01/24/2020 upon evaluation today patient appears to be doing poorly in regard to his left lower extremity. He has bright green almost teal colored drainage which is very consistent with likely Pseudomonas. He also has a Pseudomonas-like smell to the drainage noted at this point. Fortunately there is no signs of active systemic infection at this time. No fevers, chills, nausea, vomiting, or diarrhea. With that being said I explained to  the patient that this can change quickly that he can definitely become septic as a result of what we are seeing here if he does not treat this appropriately. With that being said I really think he needs to be in the ER as soon as possible in fact I really want him to go today he tells me there is no way he is likely to be able to get that set up in time. He takes care of his wife who is legally blind and I completely understand the constraints there nonetheless he is going to take care of himself if he is good to be around to take care of her. 01/31/2020 upon evaluation today patient unfortunately appears to be doing poorly yet again. There is no signs of systemic infection yet although that is a big concern for me at this point to be honest. No fevers, chills, nausea, vomiting, or diarrhea. With regard to the oral antibiotics at this point the patient does have a prescription for trazodone which I was under the impression he was taking. He states that he is not really sure that he is taking that on a regular basis or not. T be honest is on his list he tells me  that in his pill bottle he has set pills he takes every day and he is really unsure of exactly what he takes. He needs to go o back and have a look at that and try to figure things out. Either way if we were to place him on something like Cipro or Levaquin with him still being on trazodone that can prolong the QT interval which can be also dangerous. I am not willing to do that. The patient really needs to go to the hospital for in my opinion IV antibiotic therapy. He was positive for Pseudomonas and when he came in today his dressing was completely saturated with blue-green drainage consistent with Pseudomonas as well that I saw even before I saw the results of the culture and already knew what we were dealing with. ADMISSION 02/07/2020 This is a 69 year old man followed for a prolonged period of time at our sister clinic in [place]. When he was seen last week he was felt to have cellulitis with a greenish drainage and he was sent to the hospital. He has lymphedema chronic venous insufficiency and was being treated for a wound on his left lower leg. He is also a type II diabetic although the patient states he is not taking anything for his diabetes and does not monitor his blood sugar. He was admitted to [facility]. A discharge summary is not yet available but he was just discharged yesterday. In the hospital he was felt to have chronic or acute on chronic systolic heart failure. He was treated with IV Lasix. Felt his dry weight was 128 kg. He was aggressively changed to torsemide 40 twice daily. With regards to his lower extremity edema he was treated with an Haematologist. He was sent here because this was felt to be closer to his actual home which is in [facility]. I do not believe he received antibiotics. Other issues included paroxysmal atrial fibrillation and nonsustained V. tach with PVCs. He came in the clinic today with the Unna boots looking to be a mass. This was according to  our intake nurse 02/28/2020; the patient I saw once 3 weeks ago. He had severe edema likely secondary to chronic venous insufficiency and lymphedema with superficial wounds on his posterior lower  leg but marked stasis dermatitis. We put TCA and ketoconazole in his skin silver alginate and put him on compression. All that is the last we have seen of him. He tells me he took the wrap off 2 days ago i.e. it stayed on for 2-1/2 weeks. He also told us that he was in hospital however we could see no such hospitalization. 5/6; the patient was last here on 20 April for a nurse visit. He missed an appointment with [place] and then kept the wrap on till about a week ago when he was taken off at his primary doctor's office. The wound surface therefore is a lot larger. He has uncontrolled edema in the left leg. 04/10/20 - Patient presents at 2 weeks, his leg is a little better, however his buttock area is covered across the midline on both sides with white topped flat papules almost cauliflower configuration extending all the way from the top of the gluteal cleft all the way down to the bottom, there is significant intertriginous moisture and some degree of maceration open areas are noted here. Patient denies any burning or pain however there is some itching and there is some burning with rubbing on the area. Electronic Signature(s) Signed: 04/10/2020 8:41:27 AM By: Tobi Bastos MD, MBA Entered By: Tobi Bastos on 04/10/2020 08:41:27 -------------------------------------------------------------------------------- Physical Exam Details Patient Name: Date of Service: [name], [name] 04/10/2020 8:00 A M Medical Record Number: [id number] Patient Account Number: [id number] Date of Birth/Sex: Treating RN: Jan 27, 1951 (69 y.o. [name] Primary Care Provider: Aura Dials Other Clinician: Referring Provider: Treating Provider/Extender: Olean Ree in Treatment:  9 Constitutional alert and oriented x 3. sitting or standing blood pressure is within target range for patient.. supine blood pressure is within target range for patient.. pulse regular and within target range for patient.Marland Kitchen respirations regular, non-labored and within target range for patient.Marland Kitchen temperature within target range for patient.. . . Well- nourished and well-hydrated in no acute distress. Notes Left lower extremity wounds slightly smaller, no slough noted I washed and rubbed both the open areas with moistened gauze The buttock area has bilateral symmetric papular rash with reddish base extending from the top to the bottom of the gluteal cleft, these are clustered densely, not much tenderness, the erythema is located to immediate surroundings of some of these papules. No groin rash is noted Electronic Signature(s) Signed: 04/10/2020 8:43:03 AM By: Tobi Bastos MD, MBA Entered By: Tobi Bastos on 04/10/2020 08:43:02 -------------------------------------------------------------------------------- Physician Orders Details Patient Name: Date of Service: [name], [name] 04/10/2020 8:00 A M Medical Record Number: [id number] Patient Account Number: [id number] Date of Birth/Sex: Treating RN: 22-Mar-1951 (69 y.o. [name] Primary Care Provider: Aura Dials Other Clinician: Referring Provider: Treating Provider/Extender: Olean Ree in Treatment: 9 Verbal / Phone Orders: No Diagnosis Coding ICD-10 Coding Code Description [id number] Non-pressure chronic ulcer of left calf limited to breakdown of skin I89.0 Lymphedema, not elsewhere classified I87.322 Chronic venous hypertension (idiopathic) with inflammation of left lower extremity V89.38 Acute systolic (congestive) heart failure Follow-up Appointments ppointment in 1 week. - Thursday Return A Dressing Change Frequency Other: - home health change once a week and wound center to change once a  week. Skin Barriers/Peri-Wound Care ntifungal cream - mixed with bactroban ointment to buttock rash area in clinic today. A patient to apply lotrimin antifungal cream and neosporin mixed to buttock twice a day at home. Wound Cleansing May shower and wash wound with soap and water. -  use a cast protector. Primary Wound Dressing Wound #1 Left,Circumferential Lower Leg Calcium Alginate with Silver Secondary Dressing Wound #1 Left,Circumferential Lower Leg Dry Gauze ABD pad Edema Control 4 layer compression: Left lower extremity Patient to wear own compression stockings - apply to right leg in the morning and remove at night. Avoid standing for long periods of time Elevate legs to the level of the heart or above for 30 minutes daily and/or when sitting, a frequency of: - throughout the day. [place] skilled nursing for wound care. - encompass change once a week. Wound Center once a week. Consults dermatology - refer to dermatology related to rash like area to buttock area. [employer] [facility] RPR - check for Syphilis. Electronic Signature(s) Signed: 04/10/2020 4:57:23 PM By: Tobi Bastos MD, MBA Signed: 04/10/2020 5:42:52 PM By: Deon Pilling Entered By: Deon Pilling on 04/10/2020 08:47:51 Prescription 04/10/2020 -------------------------------------------------------------------------------- Brayton Mars MD Patient Name: Provider: 02/13/1951 [id number] Date of Birth: NPI#: [name] [id number] Sex: DEA #: [phone] Phone #: License #: [place] Patient Address: [place] [address] [place], [place] [address] [place], [place] [address] [phone] Allergies Name Reaction Severity codeine sulfur Provider's Orders dermatology - refer to dermatology related to rash like area to buttock area. Hand Signature: Date(s): Prescription 04/10/2020 Samson, Lenore Cordia  MD Patient Name: Provider: 18-Sep-1951 [id number] Date of Birth: NPI#: [name] [id number] Sex: DEA #: [phone] Phone #: License #: [place] Patient Address: [place] [address] [place], [place] [address] [place], [place] [address] [phone] Allergies Name Reaction Severity codeine sulfur Provider's Orders RPR - check for Syphilis. Hand Signature: Date(s): Electronic Signature(s) Signed: 04/10/2020 4:57:23 PM By: Tobi Bastos MD, MBA Signed: 04/10/2020 5:42:52 PM By: Deon Pilling Entered By: Deon Pilling on 04/10/2020 08:47:51 -------------------------------------------------------------------------------- Problem List Details Patient Name: Date of Service: [name], [name] 04/10/2020 8:00 A M Medical Record Number: [id number] Patient Account Number: [id number] Date of Birth/Sex: Treating RN: 11/16/1950 (69 y.o. [name] Primary Care Provider: Aura Dials Other Clinician: Referring Provider: Treating Provider/Extender: Olean Ree in Treatment: 9 Active Problems ICD-10 Encounter Code Description Active Date MDM Diagnosis L97.221 Non-pressure chronic ulcer of left calf limited to breakdown of skin 02/07/2020 No Yes I89.0 Lymphedema, not elsewhere classified 02/07/2020 No Yes I87.322 Chronic venous hypertension (idiopathic) with inflammation of left lower 02/07/2020 No Yes extremity I50.27 Acute systolic (congestive) heart failure 02/07/2020 No Yes R21 Rash and other nonspecific skin eruption 04/10/2020 No Yes Inactive Problems Resolved Problems Electronic Signature(s) Signed: 04/10/2020 8:39:37 AM By: Tobi Bastos MD, MBA Entered By: Tobi Bastos on 04/10/2020 08:39:36 -------------------------------------------------------------------------------- Progress Note Details Patient Name: Date of Service: [name], [name] 04/10/2020 8:00 A M Medical Record Number:  [id number] Patient Account Number: [id number] Date of Birth/Sex: Treating RN: 1950-12-11 (69 y.o. [name] Primary Care Provider: Aura Dials Other Clinician: Referring Provider: Treating Provider/Extender: Olean Ree in Treatment: 9 Subjective History of Present Illness (HPI) [place] History of Present Illness (HPI) 09/27/2019 on evaluation today patient appears to be doing poorly in regard to his bilateral lower extremities he does have bilateral lower extremity lymphedema. With that being said he also has a history of diabetes type 2, hypertension, congestive heart failure, and apparently has chronic venous insufficiency which has led to the lymphedema. Currently he also shows signs of an infection  of the left lower extremity which does have me concerned at this point as well. He tells me that he is not really having any significant pain but has had a lot of trouble with his legs in general. He does have a compression stocking he is wearing on the right lower extremity he was wrapped with an Unna boot on the left lower extremity. The patient tells me that overall he has been doing with this for several weeks and home health has been initiated. He has not been on any antibiotics yet and he tells me that the home health nurse was not sure that it was infected based on what I see today I am concerned that this likely is infected at this point. Fortunately however there does not appear to be any evidence of systemic infection which is good news. The Unna boot does seem to be helping control the edema although he may benefit from a stronger compression wrap I think that at this point without having the ABIs completed the Unna boot is where I would stand. He is supposed to be having an arterial study with ABI as ordered by his cardiologist but he states he may have missed that appointment and has not called to reschedule he needs to contact them to get this  rescheduled as soon as possible I explained to him. He tells me that he is draining a lot as far as the left lower extremity is concerned and that is something that he feels like is not lasting very long as far as the wraps are concerned when they put him on they seem to get wet extremely quickly. 10/04/2019 on evaluation today patient appears to be doing better compared to last week's evaluation. Fortunately there is no signs of active infection at this time. He has been tolerating the dressing changes without complication. He did undergo arterial studies yesterday and has an appointment tomorrow with the vascular specialist. With that being said his ABI on the left was 1.25 on the right 1.19 with a TBI of 0.61 on the left and 0.69 on the right. There was stated to be no evidence of obvious significant arterial disease and the patient had pretty much triphasic blood flow throughout. Overall I am very pleased with how things seem to be progressing. We will see if the vascular doctor says anything different tomorrow but I even at this point I feel like he would do well with a stronger compression wrap to try to get some the fluid out of his leg at this point. The good news is his drainage has slowed down considerably I do believe the antibiotics have been beneficial for him. 10/18/2019 on evaluation today patient appears to be doing poorly in regard to his left lower extremity. He still having a lot of drainage and weeping from his leg this appears to be much more erythematous and in fact I am concerned that the erythema may be spreading up his leg as well. He has not experienced any fevers, chills, nausea, vomiting, or diarrhea up to this point. I explained to him that I really feel like based on what I am seeing he may need to potentially go to the hospital for evaluation and treatment potentially even with IV antibiotics depending on what they see. With that being said he tells me that he is not really  able to do that due to the fact that he is the primary caregiver for his wife who is legally blind. He states he  is not good to be able to have anything to help as far as caring for her if he were to go into the hospital. Nonetheless he is unsure what to do in this regard. I really think that he needs faster treatment. For that reason I am going to likely try to get in touch with the infectious disease office to see if I can get him in sooner than [place]. 10/25/2019 on evaluation today patient appears to be doing a little better in regard to his weeping and edema compared to the last time I saw him. The dorsal surface of his foot is a little bit drier compared to what was this is good news. He did see Dr. Drucilla Schmidt on 10/22/2019. Dr. Drucilla Schmidt did feel that potentially this could be secondary to some cellulitis and he recommended subsequently placing the patient on Zyvox which the patient feels like has helped as well. There does not appear to be any signs of active infection at this time which is good news systemically. I am very pleased that Dr. Drucilla Schmidt was able to see him so quickly I do appreciate this greatly. With that being said he also did recommend an MRI for the patient to evaluate for any deeper signs of infection. That is actually scheduled for this coming Tuesday. He also has a follow-up appoint with Dr. Drucilla Schmidt on Tuesday. The last thing Dr. Drucilla Schmidt did mention was the possibility of more aggressive diuresis. The patient is on torsemide. With that being said this is managed by his cardiologist. I think we may want to get in touch with her clinic and see if possibly this is something that could be increased for a short amount of time depending on their recommendations to see if we can get some additional fluid off of him which in turn would likely help tremendously with his weeping and edema. He is in agreement with this plan if his cardiologist is in agreement. 11/01/2019 on evaluation today  patient unfortunately notes that he did not get his MRI done which was ordered by Dr. Drucilla Schmidt. Apparently when he went in it was 6:30 in the morning and they told him that he was there to get an MRI of his toes. With that being said the MRI was ordered of his ankle and foot and so long story short the patient said that he was not getting MRI of his toes and left. Dr. Drucilla Schmidt was alerted and again it does not appear that at all that was what he ordered that he did order the ankle and foot but nonetheless that is beside the point at this time. The patient still has considerable edema noted at this time the nurse that came out last after just put a Kerlix and Coban wrap on the patient she did not even properly wrap him. Subsequently they have also discharged him being that they state he is not skilled at this point. Therefore he is going to have to be seen here in the office only. Fortunately there is no signs of systemic infection he still has a lot of erythema around the ankle and lower extremity region again there are any specific open wounds just general weeping and evidence of erythema which Dr. Drucilla Schmidt is questioning whether or not this is even infection or if it is just more secondary to the patient's lymphedema. He has had venous reflux studies which were negative for any abnormal findings unfortunately that is also not good to be an answer for [place] here. I  have recommended the patient today contact his primary care provider to see if they can increase his fluid pills and I also think we need to increase his compression wrap to a 4-layer compression wrap to try to more effectively manage his edema. 11/08/2019 on evaluation today patient appears unfortunately to still be doing poorly. I did speak with his primary care provider office last week they were supposed to call him to advise what should be done as far as his fluid pills were concerned. With that being said they did not contact him as far as the  patient tells me. I am not really sure exactly what is going on as far as that is concerned. Nonetheless I really think that the patient needs to be admitted to the hospital for treatment likely as I explained to him he is going to require IV Lasix to get fluid off and to be honest also think he may need IV antibiotics. I have made all the outpatient referrals I can to try to get things under control he has been on oral antibiotics I really just have not seen any significant improvement he was even on linezolid. That was prescribed By infectious disease. Overall I am very concerned about the fact that this is not getting better. 11/22/2019 on evaluation today patient unfortunately is not doing very well with regard to his lower extremity edema. The left lower extremity is significantly swollen and still significantly erythematous. He has bright green drainage. He did just see Dr. Drucilla Schmidt on 11/19/2019 and Dr. Drucilla Schmidt did not feel like that this was infected. Subsequently he states that he feels like the issue is more 1 of not being at maximal diuresis and recommended the patient see his cardiologist to ensure that this was achieved. Subsequently Dr. Drucilla Schmidt states this is a issue with venous stasis dermatitis which again I completely understand that that may definitely be the case but unfortunately we've not been able to get things under control even with maximum compression at a 4 layer compression wrap. The patient is still having significant issues. Either way I do think the patient is getting need to follow-up with his cardiologist to see what we can do Dr. Drucilla Schmidt did make mention that the patient had a flareup of infection that Keflex 500 mg 4 times a day or Augmentin twice a day would be good options for nonpurulent cellulitis. The patient was again supposed to have gone to the hospital on the weekend following Christmas after he got things settled with family as far as getting someone to take care of  his wife. With that being said he tells me that when it came around to that time he just didn't think about it. When asked him about why the drainage from his leg and the issues he is having that and remind him he told me "well it really hasn't changed". 11/29/2019 on evaluation today patient presents for follow-up concerning his left lower extremity edema and weeping. He did see his cardiologist at my request after I discussed with him the situation as well. This is Binnie Kand who is a family Designer, jewellery that he saw. Subsequently the patient was recommended to start metolazone 5 mg daily for the next 5 days. They also recommended that really he needed essentially bed rest with elevation of his legs over the next week. With that being said the patient states that he is unable to even get in bed due to the fact that he has back trouble and  has to sleep in his recliner. He never really gets in the bed at all. This is probably part of the issue coupled with his heart not functioning quite as well as what we would like to see and then subsequently he also has the issue of being overweight which contributes to this as well. Overall were still trying to figure out how to best manage this to get his tweaking under control and prevent anything from worsening. Again Dr. Drucilla Schmidt really did not feel like there was any evidence of infectious processes going on at this point. 12/06/2019 on evaluation today patient's leg actually appears to be doing better with regard to edema control I am actually pleased in this regard. He also seems to be improving as far as drainage is concerned from the leg this is much better than what it was in the past. Fortunately there is no signs of active infection which is good news and overall the patient states he is actually happier with where things stand at this point. 12/13/2019 on evaluation today patient unfortunately is very dry with cracked skin noted at this point.  Fortunately there is no signs of active infection at this time. With that being said I think he is really needs some moisturizing lotion something really good to counter loosen up the dry cracked skin I think even if 40% urea cream would be ideal at this point. I discussed this with him today. In fact even look this up on his phone so that he can order it if he needs to although he may be able to find it locally I am just not sure. I told him to check with his pharmacy first. 12/20/2019 on evaluation today patient unfortunately continues to have issues with swelling of his left lower extremity. We did use Tubigrip last week to try to give him a chance to be able to take this off and take a shower and then reapply it. With that being said he just cut it off and then did not have anything to reapply he did that on Friday after we saw him on Thursday and had nothing on in the interim for almost an entire week since. Obviously that was not the direction that was given to him and when I asked him about it he stated that he just could not bend over to get off and he does not have anyone to help him. With that being said I do not think this is can be possible to have him take this off to take a shower simply due to the fact that again were not going to be able to keep the edema under control and he can get this off and back on it hurts his back way too much. 12/27/2019 on evaluation today patient actually appears to be mostly dry in regard to his lower extremity wounds in general. Fortunately there is no signs of active infection at this time. No fever chills noted. I really do not see anything open at this point which is good news. He does have a lot of dry skin that is flaking off. With that being said I think that likely I recommend wrapping him 1 more week and we can utilize the objective coating layer from the Profore wraps in order to prevent anything from sticking to the wound bed. After that I am hopeful  we will be able to get him into the compression socks that he has he supposed to bring 1 of those with him  next week he also is supposed to switch out and start using the new ones on his right leg as when he has is completely worn out. 01/10/2020 upon evaluation today patient appears to be doing poorly at this point in regard to his lower extremities on the left. He still continues to have significant weeping he actually took the wrap off 2 days ago because he stated "we were planning to take it off today anyway". He did not however put any compression stockings on but obviously was not part of the plan. Still he continues to have significant swelling I really do not know that this is going to make a significant improvement and less were able to keep him well compressed all the time and again also get his edema under good control. He was supposed to go back to see his cardiologist although I do not think that is actually been an appointment that he has made at this point that I can find anyway. He also is still taking the double dose of his Lasix but again I am not sure that that is doing the job here. 01/17/2020 upon evaluation today patient appears to be doing quite well with regard to his leg all things considered. This is better than last week. With that being said he still has significant edema and weeping unfortunately this is going to continue to be an issue. There is no signs of active infection. He tells me that he got a new prescription for torsemide. T be honest I was not quite sure that he had been prescribed that yet I thought he had been prescribed Lasix but either o way I think there may be a little bit of confusion here. I discussed this with the patient today as well. I also discussed with the patient the fact that I do believe he is going to likely require additional follow-up with his cardiologist. I suggested he give them a call to make an appointment. He told me that he called  them last week but he did not make an appointment. I am not exactly sure what he called in for or what he was looking for out of that. Nonetheless there does not appear to be any signs of active infection at this time. No fevers, chills, nausea, vomiting, or diarrhea. 01/24/2020 upon evaluation today patient appears to be doing poorly in regard to his left lower extremity. He has bright green almost teal colored drainage which is very consistent with likely Pseudomonas. He also has a Pseudomonas-like smell to the drainage noted at this point. Fortunately there is no signs of active systemic infection at this time. No fevers, chills, nausea, vomiting, or diarrhea. With that being said I explained to the patient that this can change quickly that he can definitely become septic as a result of what we are seeing here if he does not treat this appropriately. With that being said I really think he needs to be in the ER as soon as possible in fact I really want him to go today he tells me there is no way he is likely to be able to get that set up in time. He takes care of his wife who is legally blind and I completely understand the constraints there nonetheless he is going to take care of himself if he is good to be around to take care of her. 01/31/2020 upon evaluation today patient unfortunately appears to be doing poorly yet again. There is no signs of systemic infection  yet although that is a big concern for me at this point to be honest. No fevers, chills, nausea, vomiting, or diarrhea. With regard to the oral antibiotics at this point the patient does have a prescription for trazodone which I was under the impression he was taking. He states that he is not really sure that he is taking that on a regular basis or not. T be honest is on his list he tells me that in his pill bottle he has set pills he takes every day and he is really unsure of exactly what he takes. He needs to go o back and have a look at  that and try to figure things out. Either way if we were to place him on something like Cipro or Levaquin with him still being on trazodone that can prolong the QT interval which can be also dangerous. I am not willing to do that. The patient really needs to go to the hospital for in my opinion IV antibiotic therapy. He was positive for Pseudomonas and when he came in today his dressing was completely saturated with blue-green drainage consistent with Pseudomonas as well that I saw even before I saw the results of the culture and already knew what we were dealing with. ADMISSION 02/07/2020 This is a 69 year old man followed for a prolonged period of time at our sister clinic in [place]. When he was seen last week he was felt to have cellulitis with a greenish drainage and he was sent to the hospital. He has lymphedema chronic venous insufficiency and was being treated for a wound on his left lower leg. He is also a type II diabetic although the patient states he is not taking anything for his diabetes and does not monitor his blood sugar. He was admitted to [facility]. A discharge summary is not yet available but he was just discharged yesterday. In the hospital he was felt to have chronic or acute on chronic systolic heart failure. He was treated with IV Lasix. Felt his dry weight was 128 kg. He was aggressively changed to torsemide 40 twice daily. With regards to his lower extremity edema he was treated with an Haematologist. He was sent here because this was felt to be closer to his actual home which is in [facility]. I do not believe he received antibiotics. Other issues included paroxysmal atrial fibrillation and nonsustained V. tach with PVCs. He came in the clinic today with the Unna boots looking to be a mass. This was according to our intake nurse 02/28/2020; the patient I saw once 3 weeks ago. He had severe edema likely secondary to chronic venous insufficiency and lymphedema with  superficial wounds on his posterior lower leg but marked stasis dermatitis. We put TCA and ketoconazole in his skin silver alginate and put him on compression. All that is the last we have seen of him. He tells me he took the wrap off 2 days ago i.e. it stayed on for 2-1/2 weeks. He also told us that he was in hospital however we could see no such hospitalization. 5/6; the patient was last here on 20 April for a nurse visit. He missed an appointment with [place] and then kept the wrap on till about a week ago when he was taken off at his primary doctor's office. The wound surface therefore is a lot larger. He has uncontrolled edema in the left leg. 04/10/20 - Patient presents at 2 weeks, his leg is a little better, however  his buttock area is covered across the midline on both sides with white topped flat papules almost cauliflower configuration extending all the way from the top of the gluteal cleft all the way down to the bottom, there is significant intertriginous moisture and some degree of maceration open areas are noted here. Patient denies any burning or pain however there is some itching and there is some burning with rubbing on the area. Objective Constitutional alert and oriented x 3. sitting or standing blood pressure is within target range for patient.. supine blood pressure is within target range for patient.. pulse regular and within target range for patient.Marland Kitchen respirations regular, non-labored and within target range for patient.Marland Kitchen temperature within target range for patient.. Well- nourished and well-hydrated in no acute distress. Vitals Time Taken: 7:54 AM, Height: 69 in, Source: Stated, Weight: 280 lbs, Source: Stated, BMI: 41.3, Temperature: 98.2 F, Pulse: 73 bpm, Respiratory Rate: 20 breaths/min, Blood Pressure: 101/57 mmHg. General Notes: Left lower extremity wounds slightly smaller, no slough noted I washed and rubbed both the open areas with moistened gauze The buttock area has  bilateral symmetric papular rash with reddish base extending from the top to the bottom of the gluteal cleft, these are clustered densely, not much tenderness, the erythema is located to immediate surroundings of some of these papules. No groin rash is noted Integumentary (Hair, Skin) Wound #1 status is Open. Original cause of wound was Gradually Appeared. The wound is located on the Left,Circumferential Lower Leg. The wound measures 12cm length x 21cm width x 0.1cm depth; 197.92cm^2 area and 19.792cm^3 volume. There is Fat Layer (Subcutaneous Tissue) Exposed exposed. There is no tunneling or undermining noted. There is a medium amount of serous drainage noted. The wound margin is distinct with the outline attached to the wound base. There is large (67-100%) red, pink granulation within the wound bed. There is no necrotic tissue within the wound bed. Assessment Active Problems ICD-10 Non-pressure chronic ulcer of left calf limited to breakdown of skin Lymphedema, not elsewhere classified Chronic venous hypertension (idiopathic) with inflammation of left lower extremity Acute systolic (congestive) heart failure Rash and other nonspecific skin eruption Procedures Wound #1 Pre-procedure diagnosis of Wound #1 is a Lymphedema located on the Left,Circumferential Lower Leg . There was a Four Layer Compression Therapy Procedure by Baruch Gouty, RN. Post procedure Diagnosis Wound #1: Same as Pre-Procedure Plan Follow-up Appointments: Return Appointment in 1 week. - Thursday Dressing Change Frequency: Other: - home health change once a week and wound center to change once a week. Skin Barriers/Peri-Wound Care: Antifungal cream - mixed with bactroban ointment to buttock rash area in clinic today. Wound Cleansing: May shower and wash wound with soap and water. - use a cast protector. Primary Wound Dressing: Wound #1 Left,Circumferential Lower Leg: Calcium Alginate with Silver Secondary  Dressing: Wound #1 Left,Circumferential Lower Leg: Dry Gauze ABD pad Edema Control: 4 layer compression: Left lower extremity Patient to wear own compression stockings - apply to right leg in the morning and remove at night. Avoid standing for long periods of time Elevate legs to the level of the heart or above for 30 minutes daily and/or when sitting, a frequency of: - throughout the day. Home Health: Admit to [facility] - change once a week. Wound Center once a week. Consults ordered were: dermatology - refer to dermatology related to rash like area to buttock area. ordered were: RPR - check for Syphilis. -Venous leg ulcers on the left, continue silver alginate  and 4 layer compression, asked to keep leg elevated -Buttock rash is progressive and according to nursing started out as erythematous areas now becoming confluent papular eruption across the midline on both sides symmetrically on the buttock folds the entire distribution of the gluteal cleft, this does not resemble classic tinea cruris, does not resemble typical folliculitis, therefore be getting a syphilis test just to rule this out and rule out condyloma lata, in meantime use antibacterial/antifungal combination topical, h e did not fill his lotrimin prescribed at last visit. Derm Appt also to be initiated Electronic Signature(s) Signed: 04/10/2020 8:45:17 AM By: Tobi Bastos MD, MBA Entered By: Tobi Bastos on 04/10/2020 08:45:17 -------------------------------------------------------------------------------- SuperBill Details Patient Name: Date of Service: [name], [name] 04/10/2020 Medical Record Number: [id number] Patient Account Number: [id number] Date of Birth/Sex: Treating RN: 1951-10-24 (69 y.o. [name] Primary Care Provider: Aura Dials Other Clinician: Referring Provider: Treating Provider/Extender: Olean Ree in Treatment: 9 Diagnosis  Coding ICD-10 Codes Code Description [phone] Non-pressure chronic ulcer of left calf limited to breakdown of skin I89.0 Lymphedema, not elsewhere classified I87.322 Chronic venous hypertension (idiopathic) with inflammation of left lower extremity X72.62 Acute systolic (congestive) heart failure R21 Rash and other nonspecific skin eruption Facility Procedures The patient participates with Medicare or their insurance follows the Medicare Facility Guidelines: CPT4 Code Description Modifier Quantity [id number] (Facility Use Only) [phone] - [place] 1 Physician Procedures : CPT4 Code Description Modifier [id number] - WC PHYS LEVEL 3 - EST PT ICD-10 Diagnosis Description L97.221 Non-pressure chronic ulcer of left calf limited to breakdown of skin Quantity: 1 Electronic Signature(s) Signed: 04/10/2020 4:57:23 PM By: Tobi Bastos MD, MBA Signed: 04/10/2020 5:42:52 PM By: Deon Pilling Previous Signature: 04/10/2020 8:45:36 AM Version By: Tobi Bastos MD, MBA Entered By: Deon Pilling on 04/10/2020 08:59:03

## 2020-04-10 NOTE — Progress Notes (Signed)
WESTYN, DRIGGERS ([id number]) Visit Report for 04/10/2020 Arrival Information Details Patient Name: Date of Service: REDMO NDKeevon, Henney. 04/10/2020 8:00 A M Medical Record Number: [id number] Patient Account Number: [id number] Date of Birth/Sex: Treating RN: 09/18/1951 (69 y.o. [name] Primary Care Yolonda Purtle: Aura Dials Other Clinician: Referring Williams Dietrick: Treating Lamario Mani/Extender: Olean Ree in Treatment: 9 Visit Information History Since Last Visit Added or deleted any medications: No Patient Arrived: Ambulatory Any new allergies or adverse reactions: No Arrival Time: 07:54 Had a fall or experienced change in No Accompanied By: self activities of daily living that may affect Transfer Assistance: None risk of falls: Patient Identification Verified: Yes Signs or symptoms of abuse/neglect since last visito No Secondary Verification Process Completed: Yes Hospitalized since last visit: No Patient Requires Transmission-Based Precautions: No Implantable device outside of the clinic excluding No Patient Has Alerts: Yes cellular tissue based products placed in the center Patient Alerts: Patient on Blood Thinner since last visit: ABI, BIL: non compress Has Dressing in Place as Prescribed: Yes Has Compression in Place as Prescribed: Yes Pain Present Now: Yes Electronic Signature(s) Signed: 04/10/2020 5:31:16 PM By: Baruch Gouty RN, BSN Entered By: Baruch Gouty on 04/10/2020 07:54:23 -------------------------------------------------------------------------------- Compression Therapy Details Patient Name: Date of Service: [name], Byran M. 04/10/2020 8:00 A M Medical Record Number: [id number] Patient Account Number: [id number] Date of Birth/Sex: Treating RN: [birth year] (69 y.o. [name] Primary Care Tasharra Nodine: Aura Dials Other Clinician: Referring Vianna Venezia: Treating Takeyah Wieman/Extender: Olean Ree in  Treatment: 9 Compression Therapy Performed for Wound Assessment: Wound #1 Left,Circumferential Lower Leg Performed By: Clinician Baruch Gouty, RN Compression Type: Four Layer Post Procedure Diagnosis Same as Pre-procedure Electronic Signature(s) Signed: 04/10/2020 5:42:52 PM By: Deon Pilling Entered By: Deon Pilling on 04/10/2020 08:22:59 -------------------------------------------------------------------------------- Encounter Discharge Information Details Patient Name: Date of Service: [name], Morse M. 04/10/2020 8:00 A M Medical Record Number: [id number] Patient Account Number: [id number] Date of Birth/Sex: Treating RN: 1951/08/17 (69 y.o. [name] Primary Care Kaidance Pantoja: Aura Dials Other Clinician: Referring Denya Buckingham: Treating Somalia Segler/Extender: Olean Ree in Treatment: 9 Encounter Discharge Information Items Discharge Condition: Stable Ambulatory Status: Ambulatory Discharge Destination: Home Transportation: Private Auto Accompanied By: self Schedule Follow-up Appointment: Yes Clinical Summary of Care: Patient Declined Electronic Signature(s) Signed: 04/10/2020 5:31:16 PM By: Baruch Gouty RN, BSN Entered By: Baruch Gouty on 04/10/2020 08:52:19 -------------------------------------------------------------------------------- Lower Extremity Assessment Details Patient Name: Date of Service: [name], Zymere M. 04/10/2020 8:00 A M Medical Record Number: [id number] Patient Account Number: [id number] Date of Birth/Sex: Treating RN: [birth year] (69 y.o. [name] Primary Care Gabryel Talamo: Aura Dials Other Clinician: Referring Sonia Stickels: Treating Liberta Gimpel/Extender: Olean Ree in Treatment: 9 Edema Assessment Assessed: Shirlyn Goltz: No] Patrice Paradise: No] Edema: [Left: Ye] [Right: s] Calf Left: Right: Point of Measurement: 42 cm From Medial Instep 42.3 cm cm Ankle Left: Right: Point of Measurement:  17 cm From Medial Instep 30.5 cm cm Vascular Assessment Pulses: Dorsalis Pedis Palpable: [Left:No] Electronic Signature(s) Signed: 04/10/2020 5:31:16 PM By: Baruch Gouty RN, BSN Entered By: Baruch Gouty on 04/10/2020 08:11:02 -------------------------------------------------------------------------------- [place] Details Patient Name: Date of Service: [name], Zaine M. 04/10/2020 8:00 A M Medical Record Number: [id number] Patient Account Number: [id number] Date of Birth/Sex: Treating RN: 1951/01/19 (69 y.o. [name] Primary Care Amiah Frohlich: Aura Dials Other Clinician: Referring Muna Demers: Treating Destin Kittler/Extender: Olean Ree in Treatment: 9 Active Inactive Wound/Skin Impairment Nursing Diagnoses: Knowledge deficit related to ulceration/compromised skin  integrity Goals: Patient/caregiver will verbalize understanding of skin care regimen Date Initiated: 02/28/2020 Target Resolution Date: 04/25/2020 Goal Status: Active Interventions: Assess patient/caregiver ability to perform ulcer/skin care regimen upon admission and as needed Provide education on ulcer and skin care Treatment Activities: Skin care regimen initiated : 02/28/2020 Topical wound management initiated : 02/28/2020 Notes: Electronic Signature(s) Signed: 04/10/2020 5:42:52 PM By: Deon Pilling Entered By: Deon Pilling on 04/10/2020 07:54:30 -------------------------------------------------------------------------------- Pain Assessment Details Patient Name: Date of Service: [name], KAIDENCE CALLAWAY. 04/10/2020 8:00 A M Medical Record Number: [id number] Patient Account Number: [id number] Date of Birth/Sex: Treating RN: 12-10-1950 (69 y.o. [name] Primary Care Daimian Sudberry: Aura Dials Other Clinician: Referring Musab Wingard: Treating Arvella Massingale/Extender: Olean Ree in Treatment: 9 Active Problems Location of Pain Severity  and Description of Pain Patient Has Paino Yes Site Locations Pain Location: Pain Location: Generalized Pain With Dressing Change: No Duration of the Pain. Constant / Intermittento Intermittent Rate the pain. Current Pain Level: 8 Character of Pain Describe the Pain: Aching Pain Management and Medication Current Pain Management: Notes c/o of bilateral gluteal pain with sitting Electronic Signature(s) Signed: 04/10/2020 5:31:16 PM By: Baruch Gouty RN, BSN Entered By: Baruch Gouty on 04/10/2020 08:00:54 -------------------------------------------------------------------------------- Patient/Caregiver Education Details Patient Name: Date of Service: [name], Jaquay M. 5/27/2021andnbsp8:00 A M Medical Record Number: [id number] Patient Account Number: [id number] Date of Birth/Gender: Treating RN: 04-07-1951 (69 y.o. [name] Primary Care Physician: Aura Dials Other Clinician: Referring Physician: Treating Physician/Extender: Olean Ree in Treatment: 9 Education Assessment Education Provided To: Patient Education Topics Provided Wound/Skin Impairment: Handouts: Skin Care Do's and Dont's Methods: Explain/Verbal Responses: Reinforcements needed Electronic Signature(s) Signed: 04/10/2020 5:42:52 PM By: Deon Pilling Entered By: Deon Pilling on 04/10/2020 07:54:40 -------------------------------------------------------------------------------- Wound Assessment Details Patient Name: Date of Service: [name], [name]. 04/10/2020 8:00 A M Medical Record Number: [id number] Patient Account Number: [id number] Date of Birth/Sex: Treating RN: [birth year] (69 y.o. [name] Primary Care Santhiago Collingsworth: Aura Dials Other Clinician: Referring Yuvan Medinger: Treating Azucena Dart/Extender: Olean Ree in Treatment: 9 Wound Status Wound Number: 1 Primary Lymphedema Etiology: Wound Location: Left, Circumferential Lower  Leg Wound Status: Open Wounding Event: Gradually Appeared Comorbid Arrhythmia, Congestive Heart Failure, Hypertension, Type Date Acquired: 07/18/2019 History: II Diabetes Weeks Of Treatment: 9 Clustered Wound: No Wound Measurements Length: (cm) 12 Width: (cm) 21 Depth: (cm) 0.1 Area: (cm) 197.92 Volume: (cm) 19.792 % Reduction in Area: 39.4% % Reduction in Volume: 39.4% Epithelialization: Small (1-33%) Tunneling: No Undermining: No Wound Description Classification: Full Thickness Without Exposed Support Structures Wound Margin: Distinct, outline attached Exudate Amount: Medium Exudate Type: Serous Exudate Color: amber Foul Odor After Cleansing: No Slough/Fibrino No Wound Bed Granulation Amount: Large (67-100%) Exposed Structure Granulation Quality: Red, Pink Fascia Exposed: No Necrotic Amount: None Present (0%) Fat Layer (Subcutaneous Tissue) Exposed: Yes Tendon Exposed: No Muscle Exposed: No Joint Exposed: No Bone Exposed: No Treatment Notes Wound #1 (Left, Circumferential Lower Leg) 2. Periwound Care Antifungal cream Moisturizing lotion TCA Cream 3. Primary Dressing Applied Calcium Alginate Ag 4. Secondary Dressing ABD Pad 6. Support Layer Applied 4 layer compression Water quality scientist) Signed: 04/10/2020 5:31:16 PM By: Baruch Gouty RN, BSN Entered By: Baruch Gouty on 04/10/2020 08:13:16 -------------------------------------------------------------------------------- [place] Details Patient Name: Date of Service: [name], Christen M. 04/10/2020 8:00 A M Medical Record Number: [id number] Patient Account Number: [id number] Date of Birth/Sex: Treating RN: Apr 06, 1951 (69 y.o. [name] Primary Care Lokelani Lutes: Aura Dials Other Clinician: Referring Daleyssa Loiselle: Treating  Ineze Serrao/Extender: Olean Ree in Treatment: 9 Vital Signs Time Taken: 07:54 Temperature (F): 98.2 Height (in): 69 Pulse (bpm):  73 Source: Stated Respiratory Rate (breaths/min): 20 Weight (lbs): 280 Blood Pressure (mmHg): 101/57 Source: Stated Reference Range: 80 - 120 mg / dl Body Mass Index (BMI): 41.3 Electronic Signature(s) Signed: 04/10/2020 5:31:16 PM By: Baruch Gouty RN, BSN Entered By: Baruch Gouty on 04/10/2020 08:03:15

## 2020-04-17 ENCOUNTER — Encounter (HOSPITAL_BASED_OUTPATIENT_CLINIC_OR_DEPARTMENT_OTHER): Payer: Medicare Other | Attending: Internal Medicine | Admitting: Internal Medicine

## 2020-04-17 DIAGNOSIS — I890 Lymphedema, not elsewhere classified: Secondary | ICD-10-CM | POA: Diagnosis not present

## 2020-04-17 DIAGNOSIS — I87322 Chronic venous hypertension (idiopathic) with inflammation of left lower extremity: Secondary | ICD-10-CM | POA: Diagnosis not present

## 2020-04-17 DIAGNOSIS — L97229 Non-pressure chronic ulcer of left calf with unspecified severity: Secondary | ICD-10-CM | POA: Diagnosis present

## 2020-04-17 DIAGNOSIS — R21 Rash and other nonspecific skin eruption: Secondary | ICD-10-CM | POA: Insufficient documentation

## 2020-04-17 DIAGNOSIS — L97221 Non-pressure chronic ulcer of left calf limited to breakdown of skin: Secondary | ICD-10-CM | POA: Insufficient documentation

## 2020-04-17 DIAGNOSIS — I5021 Acute systolic (congestive) heart failure: Secondary | ICD-10-CM | POA: Diagnosis not present

## 2020-04-18 NOTE — Progress Notes (Signed)
[name], BRISK ([id number]) Visit Report for 04/17/2020 HPI Details Patient Name: Date of Service: [name], [name]. 04/17/2020 8:00 A M Medical Record Number: [id number] Patient Account Number: [id number] Date of Birth/Sex: Treating RN: 17-Dec-1950 (69 y.o. [name] Primary Care Provider: Aura Dials Other Clinician: Referring Provider: Treating Provider/Extender: Gabriel Carina in Treatment: 10 History of Present Illness HPI Description: [place] History of Present Illness (HPI) 09/27/2019 on evaluation today patient appears to be doing poorly in regard to his bilateral lower extremities he does have bilateral lower extremity lymphedema. With that being said he also has a history of diabetes type 2, hypertension, congestive heart failure, and apparently has chronic venous insufficiency which has led to the lymphedema. Currently he also shows signs of an infection of the left lower extremity which does have me concerned at this point as well. He tells me that he is not really having any significant pain but has had a lot of trouble with his legs in general. He does have a compression stocking he is wearing on the right lower extremity he was wrapped with an Unna boot on the left lower extremity. The patient tells me that overall he has been doing with this for several weeks and home health has been initiated. He has not been on any antibiotics yet and he tells me that the home health nurse was not sure that it was infected based on what I see today I am concerned that this likely is infected at this point. Fortunately however there does not appear to be any evidence of systemic infection which is good news. The Unna boot does seem to be helping control the edema although he may benefit from a stronger compression wrap I think that at this point without having the ABIs completed the Unna boot is where I would stand. He is supposed to be having an arterial study  with ABI as ordered by his cardiologist but he states he may have missed that appointment and has not called to reschedule he needs to contact them to get this rescheduled as soon as possible I explained to him. He tells me that he is draining a lot as far as the left lower extremity is concerned and that is something that he feels like is not lasting very long as far as the wraps are concerned when they put him on they seem to get wet extremely quickly. 10/04/2019 on evaluation today patient appears to be doing better compared to last week's evaluation. Fortunately there is no signs of active infection at this time. He has been tolerating the dressing changes without complication. He did undergo arterial studies yesterday and has an appointment tomorrow with the vascular specialist. With that being said his ABI on the left was 1.25 on the right 1.19 with a TBI of 0.61 on the left and 0.69 on the right. There was stated to be no evidence of obvious significant arterial disease and the patient had pretty much triphasic blood flow throughout. Overall I am very pleased with how things seem to be progressing. We will see if the vascular doctor says anything different tomorrow but I even at this point I feel like he would do well with a stronger compression wrap to try to get some the fluid out of his leg at this point. The good news is his drainage has slowed down considerably I do believe the antibiotics have been beneficial for him. 10/18/2019 on evaluation today patient appears to be doing  poorly in regard to his left lower extremity. He still having a lot of drainage and weeping from his leg this appears to be much more erythematous and in fact I am concerned that the erythema may be spreading up his leg as well. He has not experienced any fevers, chills, nausea, vomiting, or diarrhea up to this point. I explained to him that I really feel like based on what I am seeing he may need to potentially go to  the hospital for evaluation and treatment potentially even with IV antibiotics depending on what they see. With that being said he tells me that he is not really able to do that due to the fact that he is the primary caregiver for his wife who is legally blind. He states he is not good to be able to have anything to help as far as caring for her if he were to go into the hospital. Nonetheless he is unsure what to do in this regard. I really think that he needs faster treatment. For that reason I am going to likely try to get in touch with the infectious disease office to see if I can get him in sooner than [facility]. 10/25/2019 on evaluation today patient appears to be doing a little better in regard to his weeping and edema compared to the last time I saw him. The dorsal surface of his foot is a little bit drier compared to what was this is good news. He did see Dr. Drucilla Schmidt on 10/22/2019. Dr. Drucilla Schmidt did feel that potentially this could be secondary to some cellulitis and he recommended subsequently placing the patient on Zyvox which the patient feels like has helped as well. There does not appear to be any signs of active infection at this time which is good news systemically. I am very pleased that Dr. Drucilla Schmidt was able to see him so quickly I do appreciate this greatly. With that being said he also did recommend an MRI for the patient to evaluate for any deeper signs of infection. That is actually scheduled for this coming Tuesday. He also has a follow-up appoint with Dr. Drucilla Schmidt on Tuesday. The last thing Dr. Drucilla Schmidt did mention was the possibility of more aggressive diuresis. The patient is on torsemide. With that being said this is managed by his cardiologist. I think we may want to get in touch with her clinic and see if possibly this is something that could be increased for a short amount of time depending on their recommendations to see if we can get some additional fluid off of him which in turn  would likely help tremendously with his weeping and edema. He is in agreement with this plan if his cardiologist is in agreement. 11/01/2019 on evaluation today patient unfortunately notes that he did not get his MRI done which was ordered by Dr. Drucilla Schmidt. Apparently when he went in it was 6:30 in the morning and they told him that he was there to get an MRI of his toes. With that being said the MRI was ordered of his ankle and foot and so long story short the patient said that he was not getting MRI of his toes and left. Dr. Drucilla Schmidt was alerted and again it does not appear that at all that was what he ordered that he did order the ankle and foot but nonetheless that is beside the point at this time. The patient still has considerable edema noted at this time the nurse that came out last after  just put a Kerlix and Coban wrap on the patient she did not even properly wrap him. Subsequently they have also discharged him being that they state he is not skilled at this point. Therefore he is going to have to be seen here in the office only. Fortunately there is no signs of systemic infection he still has a lot of erythema around the ankle and lower extremity region again there are any specific open wounds just general weeping and evidence of erythema which Dr. Drucilla Schmidt is questioning whether or not this is even infection or if it is just more secondary to the patient's lymphedema. He has had venous reflux studies which were negative for any abnormal findings unfortunately that is also not good to be an answer for [place] here. I have recommended the patient today contact his primary care provider to see if they can increase his fluid pills and I also think we need to increase his compression wrap to a 4-layer compression wrap to try to more effectively manage his edema. 11/08/2019 on evaluation today patient appears unfortunately to still be doing poorly. I did speak with his primary care provider office last week  they were supposed to call him to advise what should be done as far as his fluid pills were concerned. With that being said they did not contact him as far as the patient tells me. I am not really sure exactly what is going on as far as that is concerned. Nonetheless I really think that the patient needs to be admitted to the hospital for treatment likely as I explained to him he is going to require IV Lasix to get fluid off and to be honest also think he may need IV antibiotics. I have made all the outpatient referrals I can to try to get things under control he has been on oral antibiotics I really just have not seen any significant improvement he was even on linezolid. That was prescribed By infectious disease. Overall I am very concerned about the fact that this is not getting better. 11/22/2019 on evaluation today patient unfortunately is not doing very well with regard to his lower extremity edema. The left lower extremity is significantly swollen and still significantly erythematous. He has bright green drainage. He did just see Dr. Drucilla Schmidt on 11/19/2019 and Dr. Drucilla Schmidt did not feel like that this was infected. Subsequently he states that he feels like the issue is more 1 of not being at maximal diuresis and recommended the patient see his cardiologist to ensure that this was achieved. Subsequently Dr. Drucilla Schmidt states this is a issue with venous stasis dermatitis which again I completely understand that that may definitely be the case but unfortunately we've not been able to get things under control even with maximum compression at a 4 layer compression wrap. The patient is still having significant issues. Either way I do think the patient is getting need to follow-up with his cardiologist to see what we can do Dr. Drucilla Schmidt did make mention that the patient had a flareup of infection that Keflex 500 mg 4 times a day or Augmentin twice a day would be good options for nonpurulent cellulitis. The patient was  again supposed to have gone to the hospital on the weekend following Christmas after he got things settled with family as far as getting someone to take care of his wife. With that being said he tells me that when it came around to that time he just didn't think about it. When  asked him about why the drainage from his leg and the issues he is having that and remind him he told me "well it really hasn't changed". 11/29/2019 on evaluation today patient presents for follow-up concerning his left lower extremity edema and weeping. He did see his cardiologist at my request after I discussed with him the situation as well. This is Binnie Kand who is a family Designer, jewellery that he saw. Subsequently the patient was recommended to start metolazone 5 mg daily for the next 5 days. They also recommended that really he needed essentially bed rest with elevation of his legs over the next week. With that being said the patient states that he is unable to even get in bed due to the fact that he has back trouble and has to sleep in his recliner. He never really gets in the bed at all. This is probably part of the issue coupled with his heart not functioning quite as well as what we would like to see and then subsequently he also has the issue of being overweight which contributes to this as well. Overall were still trying to figure out how to best manage this to get his tweaking under control and prevent anything from worsening. Again Dr. Drucilla Schmidt really did not feel like there was any evidence of infectious processes going on at this point. 12/06/2019 on evaluation today patient's leg actually appears to be doing better with regard to edema control I am actually pleased in this regard. He also seems to be improving as far as drainage is concerned from the leg this is much better than what it was in the past. Fortunately there is no signs of active infection which is good news and overall the patient states he is  actually happier with where things stand at this point. 12/13/2019 on evaluation today patient unfortunately is very dry with cracked skin noted at this point. Fortunately there is no signs of active infection at this time. With that being said I think he is really needs some moisturizing lotion something really good to counter loosen up the dry cracked skin I think even if 40% urea cream would be ideal at this point. I discussed this with him today. In fact even look this up on his phone so that he can order it if he needs to although he may be able to find it locally I am just not sure. I told him to check with his pharmacy first. 12/20/2019 on evaluation today patient unfortunately continues to have issues with swelling of his left lower extremity. We did use Tubigrip last week to try to give him a chance to be able to take this off and take a shower and then reapply it. With that being said he just cut it off and then did not have anything to reapply he did that on Friday after we saw him on Thursday and had nothing on in the interim for almost an entire week since. Obviously that was not the direction that was given to him and when I asked him about it he stated that he just could not bend over to get off and he does not have anyone to help him. With that being said I do not think this is can be possible to have him take this off to take a shower simply due to the fact that again were not going to be able to keep the edema under control and he can get this off and back on it  hurts his back way too much. 12/27/2019 on evaluation today patient actually appears to be mostly dry in regard to his lower extremity wounds in general. Fortunately there is no signs of active infection at this time. No fever chills noted. I really do not see anything open at this point which is good news. He does have a lot of dry skin that is flaking off. With that being said I think that likely I recommend wrapping him 1 more  week and we can utilize the objective coating layer from the Profore wraps in order to prevent anything from sticking to the wound bed. After that I am hopeful we will be able to get him into the compression socks that he has he supposed to bring 1 of those with him next week he also is supposed to switch out and start using the new ones on his right leg as when he has is completely worn out. 01/10/2020 upon evaluation today patient appears to be doing poorly at this point in regard to his lower extremities on the left. He still continues to have significant weeping he actually took the wrap off 2 days ago because he stated "we were planning to take it off today anyway". He did not however put any compression stockings on but obviously was not part of the plan. Still he continues to have significant swelling I really do not know that this is going to make a significant improvement and less were able to keep him well compressed all the time and again also get his edema under good control. He was supposed to go back to see his cardiologist although I do not think that is actually been an appointment that he has made at this point that I can find anyway. He also is still taking the double dose of his Lasix but again I am not sure that that is doing the job here. 01/17/2020 upon evaluation today patient appears to be doing quite well with regard to his leg all things considered. This is better than last week. With that being said he still has significant edema and weeping unfortunately this is going to continue to be an issue. There is no signs of active infection. He tells me that he got a new prescription for torsemide. T be honest I was not quite sure that he had been prescribed that yet I thought he had been prescribed Lasix but either o way I think there may be a little bit of confusion here. I discussed this with the patient today as well. I also discussed with the patient the fact that I do believe he  is going to likely require additional follow-up with his cardiologist. I suggested he give them a call to make an appointment. He told me that he called them last week but he did not make an appointment. I am not exactly sure what he called in for or what he was looking for out of that. Nonetheless there does not appear to be any signs of active infection at this time. No fevers, chills, nausea, vomiting, or diarrhea. 01/24/2020 upon evaluation today patient appears to be doing poorly in regard to his left lower extremity. He has bright green almost teal colored drainage which is very consistent with likely Pseudomonas. He also has a Pseudomonas-like smell to the drainage noted at this point. Fortunately there is no signs of active systemic infection at this time. No fevers, chills, nausea, vomiting, or diarrhea. With that being said I explained to  the patient that this can change quickly that he can definitely become septic as a result of what we are seeing here if he does not treat this appropriately. With that being said I really think he needs to be in the ER as soon as possible in fact I really want him to go today he tells me there is no way he is likely to be able to get that set up in time. He takes care of his wife who is legally blind and I completely understand the constraints there nonetheless he is going to take care of himself if he is good to be around to take care of her. 01/31/2020 upon evaluation today patient unfortunately appears to be doing poorly yet again. There is no signs of systemic infection yet although that is a big concern for me at this point to be honest. No fevers, chills, nausea, vomiting, or diarrhea. With regard to the oral antibiotics at this point the patient does have a prescription for trazodone which I was under the impression he was taking. He states that he is not really sure that he is taking that on a regular basis or not. T be honest is on his list he tells me  that in his pill bottle he has set pills he takes every day and he is really unsure of exactly what he takes. He needs to go o back and have a look at that and try to figure things out. Either way if we were to place him on something like Cipro or Levaquin with him still being on trazodone that can prolong the QT interval which can be also dangerous. I am not willing to do that. The patient really needs to go to the hospital for in my opinion IV antibiotic therapy. He was positive for Pseudomonas and when he came in today his dressing was completely saturated with blue-green drainage consistent with Pseudomonas as well that I saw even before I saw the results of the culture and already knew what we were dealing with. ADMISSION 02/07/2020 This is a 69 year old man followed for a prolonged period of time at our sister clinic in [place]. When he was seen last week he was felt to have cellulitis with a greenish drainage and he was sent to the hospital. He has lymphedema chronic venous insufficiency and was being treated for a wound on his left lower leg. He is also a type II diabetic although the patient states he is not taking anything for his diabetes and does not monitor his blood sugar. He was admitted to [facility]. A discharge summary is not yet available but he was just discharged yesterday. In the hospital he was felt to have chronic or acute on chronic systolic heart failure. He was treated with IV Lasix. Felt his dry weight was 128 kg. He was aggressively changed to torsemide 40 twice daily. With regards to his lower extremity edema he was treated with an Haematologist. He was sent here because this was felt to be closer to his actual home which is in [facility]. I do not believe he received antibiotics. Other issues included paroxysmal atrial fibrillation and nonsustained V. tach with PVCs. He came in the clinic today with the Unna boots looking to be a mass. This was according to  our intake nurse 02/28/2020; the patient I saw once 3 weeks ago. He had severe edema likely secondary to chronic venous insufficiency and lymphedema with superficial wounds on his posterior lower  leg but marked stasis dermatitis. We put TCA and ketoconazole in his skin silver alginate and put him on compression. All that is the last we have seen of him. He tells me he took the wrap off 2 days ago i.e. it stayed on for 2-1/2 weeks. He also told us that he was in hospital however we could see no such hospitalization. 5/6; the patient was last here on 20 April for a nurse visit. He missed an appointment with [place] and then kept the wrap on till about a week ago when he was taken off at his primary doctor's office. The wound surface therefore is a lot larger. He has uncontrolled edema in the left leg. 04/10/20 - Patient presents at 2 weeks, his leg is a little better, however his buttock area is covered across the midline on both sides with white topped flat papules almost cauliflower configuration extending all the way from the top of the gluteal cleft all the way down to the bottom, there is significant intertriginous moisture and some degree of maceration open areas are noted here. Patient denies any burning or pain however there is some itching and there is some burning with rubbing on the area. 6/3; the patient's wound is better on his left leg still open and crusted around the circumference it is small. He has severe bilateral venous inflammation chronic venous insufficiency. He is going to need ongoing compression with stockings however I am not sure exactly how were going to maintain skin integrity in this leg. Nevertheless he is close to healing in this area. When he was here 2 weeks ago he had a light rash on his buttocks that looked possibly to kneel to me. I gave him some Lotrisone. He came in last week with this a lot worse and indeed today with a horrible looking rash involving his gluteal cleft  all the way down to the perianal. There is some satellite pustular areas. He did not stay for the lab work that Dr. Evette Doffing ordered. We did arrange for him to have a dermatology appointment this afternoon. I wonder if this is herpes simplex. He does have perianal and intertriginous moisture and maceration. I think a lot of this is open as well. Electronic Signature(s) Signed: 04/18/2020 8:05:25 AM By: Linton Ham MD Entered By: Linton Ham on 04/17/2020 09:51:03 -------------------------------------------------------------------------------- Physical Exam Details Patient Name: Date of Service: [name], Zacharius M. 04/17/2020 8:00 A M Medical Record Number: [id number] Patient Account Number: [id number] Date of Birth/Sex: Treating RN: 1951/05/19 (69 y.o. [name] Primary Care Provider: Aura Dials Other Clinician: Referring Provider: Treating Provider/Extender: Gabriel Carina in Treatment: 10 Constitutional Sitting or standing Blood Pressure is within target range for patient.. Pulse regular and within target range for patient.Marland Kitchen Respirations regular, non-labored and within target range.. Temperature is normal and within the target range for the patient.Marland Kitchen Appears in no distress. Notes Wound exam We have been responsible for the left lower extremity. This is smaller but not completely closed. There is eschar slough around the wound circumference. This does not look unhealthy but the condition of the skin on the lower leg certainly not viable with severe venous inflammation, dry callused skin. The area on his buttock is really miserable. There is obviously a lot of drainage staining his undergarments. Angry wet macerated tissue between the gluteal folds. There is some pustular areas around the edge of this which are blisters. I wonder about herpes simplex. He says he has never had anything  like this before Electronic Signature(s) Signed: 04/18/2020 8:05:25 AM  By: Linton Ham MD Entered By: Linton Ham on 04/17/2020 09:52:51 -------------------------------------------------------------------------------- Physician Orders Details Patient Name: Date of Service: [name], Riot M. 04/17/2020 8:00 A M Medical Record Number: [id number] Patient Account Number: [id number] Date of Birth/Sex: Treating RN: 27-Feb-1951 (69 y.o. [name] Primary Care Provider: Aura Dials Other Clinician: Referring Provider: Treating Provider/Extender: Gabriel Carina in Treatment: 10 Verbal / Phone Orders: No Diagnosis Coding ICD-10 Coding Code Description [id number] Non-pressure chronic ulcer of left calf limited to breakdown of skin I89.0 Lymphedema, not elsewhere classified I87.322 Chronic venous hypertension (idiopathic) with inflammation of left lower extremity N23.55 Acute systolic (congestive) heart failure R21 Rash and other nonspecific skin eruption Follow-up Appointments ppointment in 1 week. - Thursday Return A ****Patient to call Dermatology today to schedule related to rash area to buttock.**** Dressing Change Frequency Wound #1 Left,Circumferential Lower Leg Other: - home health change once a week and wound center to change once a week. Skin Barriers/Peri-Wound Care ntifungal cream - mixed with bactroban ointment to buttock rash area in clinic today. A patient to apply lotrimin antifungal cream and neosporin mixed to buttock apply at least twice a day and each time to restroom. Please clean area with soap and water or baby wipes to clean area before apply creams each time. ***In clinic apply triple abx ointment and antifungal cream. cover with alginate Ag and abd pad.*** Wound Cleansing May shower and wash wound with soap and water. - use a cast protector. Primary Wound Dressing Wound #1 Left,Circumferential Lower Leg Calcium Alginate with Silver Secondary Dressing Wound #1 Left,Circumferential Lower Leg Dry  Gauze Edema Control 4 layer compression: Left lower extremity Patient to wear own compression stockings - apply to right leg in the morning and remove at night. Avoid standing for long periods of time Elevate legs to the level of the heart or above for 30 minutes daily and/or when sitting, a frequency of: - throughout the day. Support Garment 20-30 mm/Hg pressure to: - ****patient to order compression stockings from [facility] [employer]. Please bring stockings in next week to apply. [facility] for wound care. - [facility] change once a week. Wound Center once a week. Electronic Signature(s) Signed: 04/17/2020 6:01:01 PM By: Deon Pilling Signed: 04/18/2020 8:05:25 AM By: Linton Ham MD Entered By: Deon Pilling on 04/17/2020 09:18:12 -------------------------------------------------------------------------------- Problem List Details Patient Name: Date of Service: [name], Graeme M. 04/17/2020 8:00 A M Medical Record Number: [id number] Patient Account Number: [id number] Date of Birth/Sex: Treating RN: [birth year] (69 y.o. [name] Primary Care Provider: Aura Dials Other Clinician: Referring Provider: Treating Provider/Extender: Gabriel Carina in Treatment: 10 Active Problems ICD-10 Encounter Code Description Active Date MDM Diagnosis L97.221 Non-pressure chronic ulcer of left calf limited to breakdown of skin 02/07/2020 No Yes I89.0 Lymphedema, not elsewhere classified 02/07/2020 No Yes I87.322 Chronic venous hypertension (idiopathic) with inflammation of left lower 02/07/2020 No Yes extremity H06.23 Acute systolic (congestive) heart failure 02/07/2020 No Yes R21 Rash and other nonspecific skin eruption 04/10/2020 No Yes Inactive Problems Resolved Problems Electronic Signature(s) Signed: 04/18/2020 8:05:25 AM By: Linton Ham MD Entered By: Linton Ham on 04/17/2020  09:47:51 -------------------------------------------------------------------------------- Progress Note Details Patient Name: Date of Service: [name], Diamante M. 04/17/2020 8:00 A M Medical Record Number: [id number] Patient Account Number: [id number] Date of Birth/Sex: Treating RN: 05/04/1951 (69 y.o. [name] Primary Care Provider: Aura Dials Other Clinician: Referring Provider:  Treating Provider/Extender: Gabriel Carina in Treatment: 10 Subjective History of Present Illness (HPI)  History of Present Illness (HPI) 09/27/2019 on evaluation today patient appears to be doing poorly in regard to his bilateral lower extremities he does have bilateral lower extremity lymphedema. With that being said he also has a history of diabetes type 2, hypertension, congestive heart failure, and apparently has chronic venous insufficiency which has led to the lymphedema. Currently he also shows signs of an infection of the left lower extremity which does have me concerned at this point as well. He tells me that he is not really having any significant pain but has had a lot of trouble with his legs in general. He does have a compression stocking he is wearing on the right lower extremity he was wrapped with an Unna boot on the left lower extremity. The patient tells me that overall he has been doing with this for several weeks and home health has been initiated. He has not been on any antibiotics yet and he tells me that the home health nurse was not sure that it was infected based on what I see today I am concerned that this likely is infected at this point. Fortunately however there does not appear to be any evidence of systemic infection which is good news. The Unna boot does seem to be helping control the edema although he may benefit from a stronger compression wrap I think that at this point without having the ABIs completed the Unna boot is where I would stand. He  is supposed to be having an arterial study with ABI as ordered by his cardiologist but he states he may have missed that appointment and has not called to reschedule he needs to contact them to get this rescheduled as soon as possible I explained to him. He tells me that he is draining a lot as far as the left lower extremity is concerned and that is something that he feels like is not lasting very long as far as the wraps are concerned when they put him on they seem to get wet extremely quickly. 10/04/2019 on evaluation today patient appears to be doing better compared to last week's evaluation. Fortunately there is no signs of active infection at this time. He has been tolerating the dressing changes without complication. He did undergo arterial studies yesterday and has an appointment tomorrow with the vascular specialist. With that being said his ABI on the left was 1.25 on the right 1.19 with a TBI of 0.61 on the left and 0.69 on the right. There was stated to be no evidence of obvious significant arterial disease and the patient had pretty much triphasic blood flow throughout. Overall I am very pleased with how things seem to be progressing. We will see if the vascular doctor says anything different tomorrow but I even at this point I feel like he would do well with a stronger compression wrap to try to get some the fluid out of his leg at this point. The good news is his drainage has slowed down considerably I do believe the antibiotics have been beneficial for him. 10/18/2019 on evaluation today patient appears to be doing poorly in regard to his left lower extremity. He still having a lot of drainage and weeping from his leg this appears to be much more erythematous and in fact I am concerned that the erythema may be spreading up his leg as well. He has not experienced any fevers, chills,  nausea, vomiting, or diarrhea up to this point. I explained to him that I really feel like based on what I  am seeing he may need to potentially go to the hospital for evaluation and treatment potentially even with IV antibiotics depending on what they see. With that being said he tells me that he is not really able to do that due to the fact that he is the primary caregiver for his wife who is legally blind. He states he is not good to be able to have anything to help as far as caring for her if he were to go into the hospital. Nonetheless he is unsure what to do in this regard. I really think that he needs faster treatment. For that reason I am going to likely try to get in touch with the infectious disease office to see if I can get him in sooner than [place]. 10/25/2019 on evaluation today patient appears to be doing a little better in regard to his weeping and edema compared to the last time I saw him. The dorsal surface of his foot is a little bit drier compared to what was this is good news. He did see Dr. Drucilla Schmidt on 10/22/2019. Dr. Drucilla Schmidt did feel that potentially this could be secondary to some cellulitis and he recommended subsequently placing the patient on Zyvox which the patient feels like has helped as well. There does not appear to be any signs of active infection at this time which is good news systemically. I am very pleased that Dr. Drucilla Schmidt was able to see him so quickly I do appreciate this greatly. With that being said he also did recommend an MRI for the patient to evaluate for any deeper signs of infection. That is actually scheduled for this coming Tuesday. He also has a follow-up appoint with Dr. Drucilla Schmidt on Tuesday. The last thing Dr. Drucilla Schmidt did mention was the possibility of more aggressive diuresis. The patient is on torsemide. With that being said this is managed by his cardiologist. I think we may want to get in touch with her clinic and see if possibly this is something that could be increased for a short amount of time depending on their recommendations to see if we can get some  additional fluid off of him which in turn would likely help tremendously with his weeping and edema. He is in agreement with this plan if his cardiologist is in agreement. 11/01/2019 on evaluation today patient unfortunately notes that he did not get his MRI done which was ordered by Dr. Drucilla Schmidt. Apparently when he went in it was 6:30 in the morning and they told him that he was there to get an MRI of his toes. With that being said the MRI was ordered of his ankle and foot and so long story short the patient said that he was not getting MRI of his toes and left. Dr. Drucilla Schmidt was alerted and again it does not appear that at all that was what he ordered that he did order the ankle and foot but nonetheless that is beside the point at this time. The patient still has considerable edema noted at this time the nurse that came out last after just put a Kerlix and Coban wrap on the patient she did not even properly wrap him. Subsequently they have also discharged him being that they state he is not skilled at this point. Therefore he is going to have to be seen here in the office only. Fortunately there is  no signs of systemic infection he still has a lot of erythema around the ankle and lower extremity region again there are any specific open wounds just general weeping and evidence of erythema which Dr. Drucilla Schmidt is questioning whether or not this is even infection or if it is just more secondary to the patient's lymphedema. He has had venous reflux studies which were negative for any abnormal findings unfortunately that is also not good to be an answer for [place] here. I have recommended the patient today contact his primary care provider to see if they can increase his fluid pills and I also think we need to increase his compression wrap to a 4-layer compression wrap to try to more effectively manage his edema. 11/08/2019 on evaluation today patient appears unfortunately to still be doing poorly. I did speak with his  primary care provider office last week they were supposed to call him to advise what should be done as far as his fluid pills were concerned. With that being said they did not contact him as far as the patient tells me. I am not really sure exactly what is going on as far as that is concerned. Nonetheless I really think that the patient needs to be admitted to the hospital for treatment likely as I explained to him he is going to require IV Lasix to get fluid off and to be honest also think he may need IV antibiotics. I have made all the outpatient referrals I can to try to get things under control he has been on oral antibiotics I really just have not seen any significant improvement he was even on linezolid. That was prescribed By infectious disease. Overall I am very concerned about the fact that this is not getting better. 11/22/2019 on evaluation today patient unfortunately is not doing very well with regard to his lower extremity edema. The left lower extremity is significantly swollen and still significantly erythematous. He has bright green drainage. He did just see Dr. Drucilla Schmidt on 11/19/2019 and Dr. Drucilla Schmidt did not feel like that this was infected. Subsequently he states that he feels like the issue is more 1 of not being at maximal diuresis and recommended the patient see his cardiologist to ensure that this was achieved. Subsequently Dr. Drucilla Schmidt states this is a issue with venous stasis dermatitis which again I completely understand that that may definitely be the case but unfortunately we've not been able to get things under control even with maximum compression at a 4 layer compression wrap. The patient is still having significant issues. Either way I do think the patient is getting need to follow-up with his cardiologist to see what we can do Dr. Drucilla Schmidt did make mention that the patient had a flareup of infection that Keflex 500 mg 4 times a day or Augmentin twice a day would be good options for  nonpurulent cellulitis. The patient was again supposed to have gone to the hospital on the weekend following Christmas after he got things settled with family as far as getting someone to take care of his wife. With that being said he tells me that when it came around to that time he just didn't think about it. When asked him about why the drainage from his leg and the issues he is having that and remind him he told me "well it really hasn't changed". 11/29/2019 on evaluation today patient presents for follow-up concerning his left lower extremity edema and weeping. He did see his cardiologist at my request  after I discussed with him the situation as well. This is Binnie Kand who is a family Designer, jewellery that he saw. Subsequently the patient was recommended to start metolazone 5 mg daily for the next 5 days. They also recommended that really he needed essentially bed rest with elevation of his legs over the next week. With that being said the patient states that he is unable to even get in bed due to the fact that he has back trouble and has to sleep in his recliner. He never really gets in the bed at all. This is probably part of the issue coupled with his heart not functioning quite as well as what we would like to see and then subsequently he also has the issue of being overweight which contributes to this as well. Overall were still trying to figure out how to best manage this to get his tweaking under control and prevent anything from worsening. Again Dr. Drucilla Schmidt really did not feel like there was any evidence of infectious processes going on at this point. 12/06/2019 on evaluation today patient's leg actually appears to be doing better with regard to edema control I am actually pleased in this regard. He also seems to be improving as far as drainage is concerned from the leg this is much better than what it was in the past. Fortunately there is no signs of active infection which is good news  and overall the patient states he is actually happier with where things stand at this point. 12/13/2019 on evaluation today patient unfortunately is very dry with cracked skin noted at this point. Fortunately there is no signs of active infection at this time. With that being said I think he is really needs some moisturizing lotion something really good to counter loosen up the dry cracked skin I think even if 40% urea cream would be ideal at this point. I discussed this with him today. In fact even look this up on his phone so that he can order it if he needs to although he may be able to find it locally I am just not sure. I told him to check with his pharmacy first. 12/20/2019 on evaluation today patient unfortunately continues to have issues with swelling of his left lower extremity. We did use Tubigrip last week to try to give him a chance to be able to take this off and take a shower and then reapply it. With that being said he just cut it off and then did not have anything to reapply he did that on Friday after we saw him on Thursday and had nothing on in the interim for almost an entire week since. Obviously that was not the direction that was given to him and when I asked him about it he stated that he just could not bend over to get off and he does not have anyone to help him. With that being said I do not think this is can be possible to have him take this off to take a shower simply due to the fact that again were not going to be able to keep the edema under control and he can get this off and back on it hurts his back way too much. 12/27/2019 on evaluation today patient actually appears to be mostly dry in regard to his lower extremity wounds in general. Fortunately there is no signs of active infection at this time. No fever chills noted. I really do not see anything open at this point  which is good news. He does have a lot of dry skin that is flaking off. With that being said I think that  likely I recommend wrapping him 1 more week and we can utilize the objective coating layer from the Profore wraps in order to prevent anything from sticking to the wound bed. After that I am hopeful we will be able to get him into the compression socks that he has he supposed to bring 1 of those with him next week he also is supposed to switch out and start using the new ones on his right leg as when he has is completely worn out. 01/10/2020 upon evaluation today patient appears to be doing poorly at this point in regard to his lower extremities on the left. He still continues to have significant weeping he actually took the wrap off 2 days ago because he stated "we were planning to take it off today anyway". He did not however put any compression stockings on but obviously was not part of the plan. Still he continues to have significant swelling I really do not know that this is going to make a significant improvement and less were able to keep him well compressed all the time and again also get his edema under good control. He was supposed to go back to see his cardiologist although I do not think that is actually been an appointment that he has made at this point that I can find anyway. He also is still taking the double dose of his Lasix but again I am not sure that that is doing the job here. 01/17/2020 upon evaluation today patient appears to be doing quite well with regard to his leg all things considered. This is better than last week. With that being said he still has significant edema and weeping unfortunately this is going to continue to be an issue. There is no signs of active infection. He tells me that he got a new prescription for torsemide. T be honest I was not quite sure that he had been prescribed that yet I thought he had been prescribed Lasix but either o way I think there may be a little bit of confusion here. I discussed this with the patient today as well. I also discussed with the  patient the fact that I do believe he is going to likely require additional follow-up with his cardiologist. I suggested he give them a call to make an appointment. He told me that he called them last week but he did not make an appointment. I am not exactly sure what he called in for or what he was looking for out of that. Nonetheless there does not appear to be any signs of active infection at this time. No fevers, chills, nausea, vomiting, or diarrhea. 01/24/2020 upon evaluation today patient appears to be doing poorly in regard to his left lower extremity. He has bright green almost teal colored drainage which is very consistent with likely Pseudomonas. He also has a Pseudomonas-like smell to the drainage noted at this point. Fortunately there is no signs of active systemic infection at this time. No fevers, chills, nausea, vomiting, or diarrhea. With that being said I explained to the patient that this can change quickly that he can definitely become septic as a result of what we are seeing here if he does not treat this appropriately. With that being said I really think he needs to be in the ER as soon as possible in fact I really  want him to go today he tells me there is no way he is likely to be able to get that set up in time. He takes care of his wife who is legally blind and I completely understand the constraints there nonetheless he is going to take care of himself if he is good to be around to take care of her. 01/31/2020 upon evaluation today patient unfortunately appears to be doing poorly yet again. There is no signs of systemic infection yet although that is a big concern for me at this point to be honest. No fevers, chills, nausea, vomiting, or diarrhea. With regard to the oral antibiotics at this point the patient does have a prescription for trazodone which I was under the impression he was taking. He states that he is not really sure that he is taking that on a regular basis or  not. T be honest is on his list he tells me that in his pill bottle he has set pills he takes every day and he is really unsure of exactly what he takes. He needs to go o back and have a look at that and try to figure things out. Either way if we were to place him on something like Cipro or Levaquin with him still being on trazodone that can prolong the QT interval which can be also dangerous. I am not willing to do that. The patient really needs to go to the hospital for in my opinion IV antibiotic therapy. He was positive for Pseudomonas and when he came in today his dressing was completely saturated with blue-green drainage consistent with Pseudomonas as well that I saw even before I saw the results of the culture and already knew what we were dealing with. ADMISSION 02/07/2020 This is a 69 year old man followed for a prolonged period of time at our sister clinic in [place]. When he was seen last week he was felt to have cellulitis with a greenish drainage and he was sent to the hospital. He has lymphedema chronic venous insufficiency and was being treated for a wound on his left lower leg. He is also a type II diabetic although the patient states he is not taking anything for his diabetes and does not monitor his blood sugar. He was admitted to [facility]. A discharge summary is not yet available but he was just discharged yesterday. In the hospital he was felt to have chronic or acute on chronic systolic heart failure. He was treated with IV Lasix. Felt his dry weight was 128 kg. He was aggressively changed to torsemide 40 twice daily. With regards to his lower extremity edema he was treated with an Haematologist. He was sent here because this was felt to be closer to his actual home which is in [facility]. I do not believe he received antibiotics. Other issues included paroxysmal atrial fibrillation and nonsustained V. tach with PVCs. He came in the clinic today with the Unna boots  looking to be a mass. This was according to our intake nurse 02/28/2020; the patient I saw once 3 weeks ago. He had severe edema likely secondary to chronic venous insufficiency and lymphedema with superficial wounds on his posterior lower leg but marked stasis dermatitis. We put TCA and ketoconazole in his skin silver alginate and put him on compression. All that is the last we have seen of him. He tells me he took the wrap off 2 days ago i.e. it stayed on for 2-1/2 weeks. He also told  us that he was in hospital however we could see no such hospitalization. 5/6; the patient was last here on 20 April for a nurse visit. He missed an appointment with [place] and then kept the wrap on till about a week ago when he was taken off at his primary doctor's office. The wound surface therefore is a lot larger. He has uncontrolled edema in the left leg. 04/10/20 - Patient presents at 2 weeks, his leg is a little better, however his buttock area is covered across the midline on both sides with white topped flat papules almost cauliflower configuration extending all the way from the top of the gluteal cleft all the way down to the bottom, there is significant intertriginous moisture and some degree of maceration open areas are noted here. Patient denies any burning or pain however there is some itching and there is some burning with rubbing on the area. 6/3; the patient's wound is better on his left leg still open and crusted around the circumference it is small. He has severe bilateral venous inflammation chronic venous insufficiency. He is going to need ongoing compression with stockings however I am not sure exactly how were going to maintain skin integrity in this leg. Nevertheless he is close to healing in this area. When he was here 2 weeks ago he had a light rash on his buttocks that looked possibly to kneel to me. I gave him some Lotrisone. He came in last week with this a lot worse and indeed today with a  horrible looking rash involving his gluteal cleft all the way down to the perianal. There is some satellite pustular areas. He did not stay for the lab work that Dr. Evette Doffing ordered. We did arrange for him to have a dermatology appointment this afternoon. I wonder if this is herpes simplex. He does have perianal and intertriginous moisture and maceration. I think a lot of this is open as well. Objective Constitutional Sitting or standing Blood Pressure is within target range for patient.. Pulse regular and within target range for patient.Marland Kitchen Respirations regular, non-labored and within target range.. Temperature is normal and within the target range for the patient.Marland Kitchen Appears in no distress. Vitals Time Taken: 8:12 AM, Height: 69 in, Source: Stated, Weight: 280 lbs, Source: Stated, BMI: 41.3, Temperature: 98.2 F, Pulse: 106 bpm, Respiratory Rate: 18 breaths/min, Blood Pressure: 103/75 mmHg. General Notes: Wound exam ooWe have been responsible for the left lower extremity. This is smaller but not completely closed. There is eschar slough around the wound circumference. This does not look unhealthy but the condition of the skin on the lower leg certainly not viable with severe venous inflammation, dry callused skin. ooThe area on his buttock is really miserable. There is obviously a lot of drainage staining his undergarments. Angry wet macerated tissue between the gluteal folds. There is some pustular areas around the edge of this which are blisters. I wonder about herpes simplex. He says he has never had anything like this before Integumentary (Hair, Skin) Wound #1 status is Open. Original cause of wound was Gradually Appeared. The wound is located on the Left,Circumferential Lower Leg. The wound measures 2cm length x 2cm width x 0.1cm depth; 3.142cm^2 area and 0.314cm^3 volume. There is no tunneling or undermining noted. There is a small amount of serosanguineous drainage noted. The wound margin is  indistinct and nonvisible. There is large (67-100%) red granulation within the wound bed. There is no necrotic tissue within the wound bed. Assessment Active Problems ICD-10  Non-pressure chronic ulcer of left calf limited to breakdown of skin Lymphedema, not elsewhere classified Chronic venous hypertension (idiopathic) with inflammation of left lower extremity Acute systolic (congestive) heart failure Rash and other nonspecific skin eruption Procedures Wound #1 Pre-procedure diagnosis of Wound #1 is a Lymphedema located on the Left,Circumferential Lower Leg . There was a Four Layer Compression Therapy Procedure by Baruch Gouty, RN. Post procedure Diagnosis Wound #1: Same as Pre-Procedure Plan Follow-up Appointments: Return Appointment in 1 week. - Thursday ****Patient to call Dermatology today to schedule related to rash area to buttock.**** Dressing Change Frequency: Wound #1 Left,Circumferential Lower Leg: Other: - home health change once a week and wound center to change once a week. Skin Barriers/Peri-Wound Care: Antifungal cream - mixed with bactroban ointment to buttock rash area in clinic today. patient to apply lotrimin antifungal cream and neosporin mixed to buttock apply at least twice a day and each time to restroom. Please clean area with soap and water or baby wipes to clean area before apply creams each time. ***In clinic apply triple abx ointment and antifungal cream. cover with alginate Ag and abd pad.*** Wound Cleansing: May shower and wash wound with soap and water. - use a cast protector. Primary Wound Dressing: Wound #1 Left,Circumferential Lower Leg: Calcium Alginate with Silver Secondary Dressing: Wound #1 Left,Circumferential Lower Leg: Dry Gauze Edema Control: 4 layer compression: Left lower extremity Patient to wear own compression stockings - apply to right leg in the morning and remove at night. Avoid standing for long periods of time Elevate legs to  the level of the heart or above for 30 minutes daily and/or when sitting, a frequency of: - throughout the day. Support Garment 20-30 mm/Hg pressure to: - ****patient to order compression stockings from [facility]. Please bring stockings in next week to apply. Home Health: [facility] for wound care. - [facility] change once a week. Wound Center once a week. 1. We applied silver alginate and ABDs to the buttocks. He fortunately is going to see dermatology this afternoon. I am not sure what this is I wondered about herpes simplex. He did not stay for the lab work I think which was a syphilis screen the doctor Madduri ordered but I have never really seen syphilis even the pictures look like this 2. We will continue with silver alginate to the left leg Liberal TCA and moisturizer. He is going to need compression stockings. Electronic Signature(s) Signed: 04/18/2020 8:05:25 AM By: Linton Ham MD Entered By: Linton Ham on 04/17/2020 09:53:57 -------------------------------------------------------------------------------- SuperBill Details Patient Name: Date of Service: [name], Aksel M. 04/17/2020 Medical Record Number: [id number] Patient Account Number: [id number] Date of Birth/Sex: Treating RN: [birth year] (69 y.o. [name] Primary Care Provider: Aura Dials Other Clinician: Referring Provider: Treating Provider/Extender: Gabriel Carina in Treatment: 10 Diagnosis Coding ICD-10 Codes Code Description [phone] Non-pressure chronic ulcer of left calf limited to breakdown of skin I89.0 Lymphedema, not elsewhere classified I87.322 Chronic venous hypertension (idiopathic) with inflammation of left lower extremity Y51.10 Acute systolic (congestive) heart failure R21 Rash and other nonspecific skin eruption Facility Procedures The patient participates with Medicare or their insurance follows the Medicare Facility Guidelines:  CPT4 Code Description Modifier Quantity [id number] (Facility Use Only) [phone] - [place] [facility] Physician Procedures : CPT4 Code Description Modifier [id number] - WC PHYS LEVEL 3 - EST PT ICD-10 Diagnosis Description L97.221 Non-pressure chronic ulcer of left calf limited to breakdown of skin I89.0  Lymphedema, not elsewhere classified R21 Rash and other nonspecific  skin eruption Quantity: 1 Electronic Signature(s) Signed: 04/18/2020 8:05:25 AM By: Linton Ham MD Entered By: Linton Ham on 04/17/2020 09:54:18

## 2020-04-21 NOTE — Progress Notes (Signed)
SAGE, KOPERA ([id number]) Visit Report for 04/17/2020 Arrival Information Details Patient Name: Date of Service: [name], [name] 04/17/2020 8:00 A M Medical Record Number: [id number] Patient Account Number: [id number] Date of Birth/Sex: Treating RN: [birth year] (69 y.o. [name] Primary Care Lezette Kitts: Aura Dials Other Clinician: Referring Dorsie Burich: Treating Khang Hannum/Extender: Gabriel Carina in Treatment: 10 Visit Information History Since Last Visit All ordered tests and consults were completed: No Patient Arrived: Ambulatory Added or deleted any medications: No Arrival Time: 08:11 Any new allergies or adverse reactions: No Accompanied By: self Had a fall or experienced change in No Transfer Assistance: None activities of daily living that may affect Patient Identification Verified: Yes risk of falls: Secondary Verification Process Completed: Yes Signs or symptoms of abuse/neglect since last visito No Patient Requires Transmission-Based Precautions: No Hospitalized since last visit: No Patient Has Alerts: Yes Implantable device outside of the clinic excluding No Patient Alerts: Patient on Blood Thinner cellular tissue based products placed in the center ABI, BIL: non compress since last visit: Has Dressing in Place as Prescribed: Yes Has Compression in Place as Prescribed: Yes Pain Present Now: Yes Electronic Signature(s) Signed: 04/17/2020 5:32:26 PM By: Baruch Gouty RN, BSN Entered By: Baruch Gouty on 04/17/2020 08:12:06 -------------------------------------------------------------------------------- Compression Therapy Details Patient Name: Date of Service: [name], [name] 04/17/2020 8:00 A M Medical Record Number: [id number] Patient Account Number: [id number] Date of Birth/Sex: Treating RN: 1951-10-05 (69 y.o. [name] Primary Care Rayvn Rickerson: Aura Dials Other Clinician: Referring Jazma Pickel: Treating  Letricia Krinsky/Extender: Gabriel Carina in Treatment: 10 Compression Therapy Performed for Wound Assessment: Wound #1 Left,Circumferential Lower Leg Performed By: Clinician Baruch Gouty, RN Compression Type: Four Layer Post Procedure Diagnosis Same as Pre-procedure Electronic Signature(s) Signed: 04/17/2020 6:01:01 PM By: Deon Pilling Entered By: Deon Pilling on 04/17/2020 09:13:16 -------------------------------------------------------------------------------- Encounter Discharge Information Details Patient Name: Date of Service: [name], [name] 04/17/2020 8:00 A M Medical Record Number: [id number] Patient Account Number: [id number] Date of Birth/Sex: Treating RN: 02-06-1951 (69 y.o. [name] Primary Care Eder Macek: Aura Dials Other Clinician: Referring Amarius Toto: Treating Monna Crean/Extender: Gabriel Carina in Treatment: 10 Encounter Discharge Information Items Discharge Condition: Stable Ambulatory Status: Ambulatory Discharge Destination: Home Transportation: Private Auto Accompanied By: alone Schedule Follow-up Appointment: Yes Clinical Summary of Care: Patient Declined Electronic Signature(s) Signed: 04/21/2020 6:28:13 PM By: Levan Hurst RN, BSN Entered By: Levan Hurst on 04/17/2020 09:51:25 -------------------------------------------------------------------------------- Lower Extremity Assessment Details Patient Name: Date of Service: [name], [name] 04/17/2020 8:00 A M Medical Record Number: [id number] Patient Account Number: [id number] Date of Birth/Sex: Treating RN: 06-02-1951 (69 y.o. [name] Primary Care Oshae Simmering: Aura Dials Other Clinician: Referring Francelia Mclaren: Treating Perla Echavarria/Extender: Gabriel Carina in Treatment: 10 Edema Assessment Assessed: Shirlyn Goltz: No] Patrice Paradise: No] Edema: [Left: Ye] [Right: s] Calf Left: Right: Point of Measurement: 42 cm From Medial  Instep 43.5 cm cm Ankle Left: Right: Point of Measurement: 17 cm From Medial Instep 28.5 cm cm Vascular Assessment Pulses: Dorsalis Pedis Palpable: [Left:Yes] Electronic Signature(s) Signed: 04/17/2020 5:32:26 PM By: Baruch Gouty RN, BSN Entered By: Baruch Gouty on 04/17/2020 08:26:55 -------------------------------------------------------------------------------- Multi Wound Chart Details Patient Name: Date of Service: [name], [name] 04/17/2020 8:00 A M Medical Record Number: [id number] Patient Account Number: [id number] Date of Birth/Sex: Treating RN: August 12, 1951 (69 y.o. [name] Primary Care Worthington Cruzan: Aura Dials Other Clinician: Referring Reuben Knoblock: Treating Demarie Hyneman/Extender: Gabriel Carina in Treatment: 10 Vital Signs Height(in): 47  Pulse(bpm): 106 Weight(lbs): 280 Blood Pressure(mmHg): 103/75 Body Mass Index(BMI): 41 Temperature(F): 98.2 Respiratory Rate(breaths/min): 18 Photos: [1:No Photos Left, Circumferential Lower Leg] [N/A:N/A N/A] Wound Location: [1:Gradually Appeared] [N/A:N/A] Wounding Event: [1:Lymphedema] [N/A:N/A] Primary Etiology: [1:Arrhythmia, Congestive Heart Failure,] [N/A:N/A] Comorbid History: [1:Hypertension, Type II Diabetes 07/18/2019] [N/A:N/A] Date Acquired: [1:10] [N/A:N/A] Weeks of Treatment: [1:Open] [N/A:N/A] Wound Status: [1:2x2x0.1] [N/A:N/A] Measurements L x W x D (cm) [1:3.142] [N/A:N/A] A (cm) : rea [1:0.314] [N/A:N/A] Volume (cm) : [1:99.00%] [N/A:N/A] % Reduction in Area: [1:99.00%] [N/A:N/A] % Reduction in Volume: [1:Full Thickness Without Exposed] [N/A:N/A] Classification: [1:Support Structures Small] [N/A:N/A] Exudate Amount: [1:Serosanguineous] [N/A:N/A] Exudate Type: [1:red, brown] [N/A:N/A] Exudate Color: [1:Indistinct, nonvisible] [N/A:N/A] Wound Margin: [1:Large (67-100%)] [N/A:N/A] Granulation Amount: [1:Red] [N/A:N/A] Granulation Quality: [1:None Present (0%)]  [N/A:N/A] Necrotic Amount: [1:Fascia: No] [N/A:N/A] Exposed Structures: [1:Fat Layer (Subcutaneous Tissue) Exposed: No Tendon: No Muscle: No Joint: No Bone: No Large (67-100%)] [N/A:N/A] Epithelialization: [1:Compression Therapy] [N/A:N/A] Treatment Notes Electronic Signature(s) Signed: 04/17/2020 6:01:01 PM By: Deon Pilling Signed: 04/18/2020 8:05:25 AM By: Linton Ham MD Entered By: Linton Ham on 04/17/2020 09:47:58 -------------------------------------------------------------------------------- Multi-Disciplinary Care Plan Details Patient Name: Date of Service: [name], [name] 04/17/2020 8:00 A M Medical Record Number: [id number] Patient Account Number: [id number] Date of Birth/Sex: Treating RN: 11/03/1951 (69 y.o. [name] Primary Care Marlyce Mcdougald: Aura Dials Other Clinician: Referring Sole Lengacher: Treating Dylon Correa/Extender: Gabriel Carina in Treatment: 10 Active Inactive Wound/Skin Impairment Nursing Diagnoses: Knowledge deficit related to ulceration/compromised skin integrity Goals: Patient/caregiver will verbalize understanding of skin care regimen Date Initiated: 02/28/2020 Target Resolution Date: 04/25/2020 Goal Status: Active Interventions: Assess patient/caregiver ability to perform ulcer/skin care regimen upon admission and as needed Provide education on ulcer and skin care Treatment Activities: Skin care regimen initiated : 02/28/2020 Topical wound management initiated : 02/28/2020 Notes: Electronic Signature(s) Signed: 04/17/2020 6:01:01 PM By: Deon Pilling Entered By: Deon Pilling on 04/17/2020 08:14:34 -------------------------------------------------------------------------------- Pain Assessment Details Patient Name: Date of Service: [name], Keith Mocha. 04/17/2020 8:00 A M Medical Record Number: [id number] Patient Account Number: [id number] Date of Birth/Sex: Treating RN: July 02, 1951 (69 y.o. [name] Primary  Care Kirkland Figg: Aura Dials Other Clinician: Referring Dezmon Conover: Treating Labrandon Knoch/Extender: Gabriel Carina in Treatment: 10 Active Problems Location of Pain Severity and Description of Pain Patient Has Paino Yes Site Locations Pain Location: Generalized Pain Rate the pain. Current Pain Level: 9 Character of Pain Describe the Pain: Burning Pain Management and Medication Current Pain Management: How does your wound impact your activities of daily livingo Sleep: Yes Bathing: No Appetite: No Relationship With Others: No Bladder Continence: No Emotions: Yes Bowel Continence: No Work: No Toileting: No Drive: No Dressing: No Hobbies: No Notes reports burning pain to rash on buttocks Electronic Signature(s) Signed: 04/17/2020 5:32:26 PM By: Baruch Gouty RN, BSN Entered By: Baruch Gouty on 04/17/2020 08:14:22 -------------------------------------------------------------------------------- Patient/Caregiver Education Details Patient Name: Date of Service: [name], Enrigue M. 6/3/2021andnbsp8:00 A M Medical Record Number: [id number] Patient Account Number: [id number] Date of Birth/Gender: Treating RN: 10-28-1951 (69 y.o. [name] Primary Care Physician: Aura Dials Other Clinician: Referring Physician: Treating Physician/Extender: Gabriel Carina in Treatment: 10 Education Assessment Education Provided To: Patient Education Topics Provided Wound/Skin Impairment: Handouts: Skin Care Do's and Dont's Methods: Explain/Verbal Responses: Reinforcements needed Electronic Signature(s) Signed: 04/17/2020 6:01:01 PM By: Deon Pilling Entered By: Deon Pilling on 04/17/2020 08:14:45 -------------------------------------------------------------------------------- Wound Assessment Details Patient Name: Date of Service: [name], [name] 04/17/2020 8:00 A M Medical Record Number: [id number]  Patient Account Number:  [id number] Date of Birth/Sex: Treating RN: 1951/04/01 (69 y.o. [name] Primary Care French Kendra: Aura Dials Other Clinician: Referring Khaleed Holan: Treating Brenee Gajda/Extender: Gabriel Carina in Treatment: 10 Wound Status Wound Number: 1 Primary Lymphedema Etiology: Etiology: Wound Location: Left, Circumferential Lower Leg Wound Status: Open Wounding Event: Gradually Appeared Comorbid Arrhythmia, Congestive Heart Failure, Hypertension, Type II Date Acquired: 07/18/2019 History: Diabetes Weeks Of Treatment: 10 Clustered Wound: No Photos Photo Uploaded By: Mikeal Hawthorne on 04/21/2020 11:13:50 Wound Measurements Length: (cm) 2 Width: (cm) 2 Depth: (cm) 0.1 Area: (cm) 3.142 Volume: (cm) 0.314 % Reduction in Area: 99% % Reduction in Volume: 99% Epithelialization: Large (67-100%) Tunneling: No Undermining: No Wound Description Classification: Full Thickness Without Exposed Support Structures Wound Margin: Indistinct, nonvisible Exudate Amount: Small Exudate Type: Serosanguineous Exudate Color: red, brown Foul Odor After Cleansing: No Slough/Fibrino No Wound Bed Granulation Amount: Large (67-100%) Exposed Structure Granulation Quality: Red Fascia Exposed: No Necrotic Amount: None Present (0%) Fat Layer (Subcutaneous Tissue) Exposed: No Tendon Exposed: No Muscle Exposed: No Joint Exposed: No Bone Exposed: No Treatment Notes Wound #1 (Left, Circumferential Lower Leg) 1. Cleanse With Soap and water 3. Primary Dressing Applied Calcium Alginate Ag 4. Secondary Dressing Dry Gauze 6. Support Layer Applied 4 layer compression wrap Electronic Signature(s) Signed: 04/17/2020 6:01:01 PM By: Deon Pilling Entered By: Deon Pilling on 04/17/2020 09:12:27 -------------------------------------------------------------------------------- Vitals Details Patient Name: Date of Service: [name], [name] 04/17/2020 8:00 A M Medical Record Number:  [id number] Patient Account Number: [id number] Date of Birth/Sex: Treating RN: [birth year] (69 y.o. [name] Primary Care Offie Pickron: Other Clinician: Aura Dials Referring Lorenza Winkleman: Treating Sherissa Tenenbaum/Extender: Gabriel Carina in Treatment: 10 Vital Signs Time Taken: 08:12 Temperature (F): 98.2 Height (in): 69 Pulse (bpm): 106 Source: Stated Respiratory Rate (breaths/min): 18 Weight (lbs): 280 Blood Pressure (mmHg): 103/75 Source: Stated Reference Range: 80 - 120 mg / dl Body Mass Index (BMI): 41.3 Electronic Signature(s) Signed: 04/17/2020 5:32:26 PM By: Baruch Gouty RN, BSN Entered By: Baruch Gouty on 04/17/2020 08:12:33

## 2020-04-24 ENCOUNTER — Encounter (HOSPITAL_BASED_OUTPATIENT_CLINIC_OR_DEPARTMENT_OTHER): Payer: Medicare Other | Admitting: Internal Medicine

## 2020-04-24 ENCOUNTER — Other Ambulatory Visit: Payer: Self-pay

## 2020-04-24 DIAGNOSIS — I87322 Chronic venous hypertension (idiopathic) with inflammation of left lower extremity: Secondary | ICD-10-CM | POA: Diagnosis not present

## 2020-04-28 NOTE — Progress Notes (Signed)
SHADE, KALEY ([id number]) Visit Report for 04/24/2020 HPI Details Patient Name: Date of Service: REDMO NDGurjot, Brisco. 04/24/2020 8:00 A M Medical Record Number: [id number] Patient Account Number: [id number] Date of Birth/Sex: Treating RN: [birth year] (69 y.o. [name] Primary Care Provider: Aura Dials Other Clinician: Referring Provider: Treating Provider/Extender: Gabriel Carina in Treatment: 11 History of Present Illness HPI Description: [place] History of Present Illness (HPI) 09/27/2019 on evaluation today patient appears to be doing poorly in regard to his bilateral lower extremities he does have bilateral lower extremity lymphedema. With that being said he also has a history of diabetes type 2, hypertension, congestive heart failure, and apparently has chronic venous insufficiency which has led to the lymphedema. Currently he also shows signs of an infection of the left lower extremity which does have me concerned at this point as well. He tells me that he is not really having any significant pain but has had a lot of trouble with his legs in general. He does have a compression stocking he is wearing on the right lower extremity he was wrapped with an Unna boot on the left lower extremity. The patient tells me that overall he has been doing with this for several weeks and home health has been initiated. He has not been on any antibiotics yet and he tells me that the home health nurse was not sure that it was infected based on what I see today I am concerned that this likely is infected at this point. Fortunately however there does not appear to be any evidence of systemic infection which is good news. The Unna boot does seem to be helping control the edema although he may benefit from a stronger compression wrap I think that at this point without having the ABIs completed the Unna boot is where I would stand. He is supposed to be having an arterial  study with ABI as ordered by his cardiologist but he states he may have missed that appointment and has not called to reschedule he needs to contact them to get this rescheduled as soon as possible I explained to him. He tells me that he is draining a lot as far as the left lower extremity is concerned and that is something that he feels like is not lasting very long as far as the wraps are concerned when they put him on they seem to get wet extremely quickly. 10/04/2019 on evaluation today patient appears to be doing better compared to last week's evaluation. Fortunately there is no signs of active infection at this time. He has been tolerating the dressing changes without complication. He did undergo arterial studies yesterday and has an appointment tomorrow with the vascular specialist. With that being said his ABI on the left was 1.25 on the right 1.19 with a TBI of 0.61 on the left and 0.69 on the right. There was stated to be no evidence of obvious significant arterial disease and the patient had pretty much triphasic blood flow throughout. Overall I am very pleased with how things seem to be progressing. We will see if the vascular doctor says anything different tomorrow but I even at this point I feel like he would do well with a stronger compression wrap to try to get some the fluid out of his leg at this point. The good news is his drainage has slowed down considerably I do believe the antibiotics have been beneficial for him. 10/18/2019 on evaluation today patient appears to be doing  poorly in regard to his left lower extremity. He still having a lot of drainage and weeping from his leg this appears to be much more erythematous and in fact I am concerned that the erythema may be spreading up his leg as well. He has not experienced any fevers, chills, nausea, vomiting, or diarrhea up to this point. I explained to him that I really feel like based on what I am seeing he may need to potentially  go to the hospital for evaluation and treatment potentially even with IV antibiotics depending on what they see. With that being said he tells me that he is not really able to do that due to the fact that he is the primary caregiver for his wife who is legally blind. He states he is not good to be able to have anything to help as far as caring for her if he were to go into the hospital. Nonetheless he is unsure what to do in this regard. I really think that he needs faster treatment. For that reason I am going to likely try to get in touch with the infectious disease office to see if I can get him in sooner than [place]. 10/25/2019 on evaluation today patient appears to be doing a little better in regard to his weeping and edema compared to the last time I saw him. The dorsal surface of his foot is a little bit drier compared to what was this is good news. He did see Dr. Drucilla Schmidt on 10/22/2019. Dr. Drucilla Schmidt did feel that potentially this could be secondary to some cellulitis and he recommended subsequently placing the patient on Zyvox which the patient feels like has helped as well. There does not appear to be any signs of active infection at this time which is good news systemically. I am very pleased that Dr. Drucilla Schmidt was able to see him so quickly I do appreciate this greatly. With that being said he also did recommend an MRI for the patient to evaluate for any deeper signs of infection. That is actually scheduled for this coming Tuesday. He also has a follow-up appoint with Dr. Drucilla Schmidt on Tuesday. The last thing Dr. Drucilla Schmidt did mention was the possibility of more aggressive diuresis. The patient is on torsemide. With that being said this is managed by his cardiologist. I think we may want to get in touch with her clinic and see if possibly this is something that could be increased for a short amount of time depending on their recommendations to see if we can get some additional fluid off of him which in  turn would likely help tremendously with his weeping and edema. He is in agreement with this plan if his cardiologist is in agreement. 11/01/2019 on evaluation today patient unfortunately notes that he did not get his MRI done which was ordered by Dr. Drucilla Schmidt. Apparently when he went in it was 6:30 in the morning and they told him that he was there to get an MRI of his toes. With that being said the MRI was ordered of his ankle and foot and so long story short the patient said that he was not getting MRI of his toes and left. Dr. Drucilla Schmidt was alerted and again it does not appear that at all that was what he ordered that he did order the ankle and foot but nonetheless that is beside the point at this time. The patient still has considerable edema noted at this time the nurse that came out last after  just put a Kerlix and Coban wrap on the patient she did not even properly wrap him. Subsequently they have also discharged him being that they state he is not skilled at this point. Therefore he is going to have to be seen here in the office only. Fortunately there is no signs of systemic infection he still has a lot of erythema around the ankle and lower extremity region again there are any specific open wounds just general weeping and evidence of erythema which Dr. Drucilla Schmidt is questioning whether or not this is even infection or if it is just more secondary to the patient's lymphedema. He has had venous reflux studies which were negative for any abnormal findings unfortunately that is also not good to be an answer for [place] here. I have recommended the patient today contact his primary care provider to see if they can increase his fluid pills and I also think we need to increase his compression wrap to a 4-layer compression wrap to try to more effectively manage his edema. 11/08/2019 on evaluation today patient appears unfortunately to still be doing poorly. I did speak with his primary care provider office last  week they were supposed to call him to advise what should be done as far as his fluid pills were concerned. With that being said they did not contact him as far as the patient tells me. I am not really sure exactly what is going on as far as that is concerned. Nonetheless I really think that the patient needs to be admitted to the hospital for treatment likely as I explained to him he is going to require IV Lasix to get fluid off and to be honest also think he may need IV antibiotics. I have made all the outpatient referrals I can to try to get things under control he has been on oral antibiotics I really just have not seen any significant improvement he was even on linezolid. That was prescribed By infectious disease. Overall I am very concerned about the fact that this is not getting better. 11/22/2019 on evaluation today patient unfortunately is not doing very well with regard to his lower extremity edema. The left lower extremity is significantly swollen and still significantly erythematous. He has bright green drainage. He did just see Dr. Drucilla Schmidt on 11/19/2019 and Dr. Drucilla Schmidt did not feel like that this was infected. Subsequently he states that he feels like the issue is more 1 of not being at maximal diuresis and recommended the patient see his cardiologist to ensure that this was achieved. Subsequently Dr. Drucilla Schmidt states this is a issue with venous stasis dermatitis which again I completely understand that that may definitely be the case but unfortunately we've not been able to get things under control even with maximum compression at a 4 layer compression wrap. The patient is still having significant issues. Either way I do think the patient is getting need to follow-up with his cardiologist to see what we can do Dr. Drucilla Schmidt did make mention that the patient had a flareup of infection that Keflex 500 mg 4 times a day or Augmentin twice a day would be good options for nonpurulent cellulitis. The patient  was again supposed to have gone to the hospital on the weekend following Christmas after he got things settled with family as far as getting someone to take care of his wife. With that being said he tells me that when it came around to that time he just didn't think about it. When  asked him about why the drainage from his leg and the issues he is having that and remind him he told me "well it really hasn't changed". 11/29/2019 on evaluation today patient presents for follow-up concerning his left lower extremity edema and weeping. He did see his cardiologist at my request after I discussed with him the situation as well. This is Binnie Kand who is a family Designer, jewellery that he saw. Subsequently the patient was recommended to start metolazone 5 mg daily for the next 5 days. They also recommended that really he needed essentially bed rest with elevation of his legs over the next week. With that being said the patient states that he is unable to even get in bed due to the fact that he has back trouble and has to sleep in his recliner. He never really gets in the bed at all. This is probably part of the issue coupled with his heart not functioning quite as well as what we would like to see and then subsequently he also has the issue of being overweight which contributes to this as well. Overall were still trying to figure out how to best manage this to get his tweaking under control and prevent anything from worsening. Again Dr. Drucilla Schmidt really did not feel like there was any evidence of infectious processes going on at this point. 12/06/2019 on evaluation today patient's leg actually appears to be doing better with regard to edema control I am actually pleased in this regard. He also seems to be improving as far as drainage is concerned from the leg this is much better than what it was in the past. Fortunately there is no signs of active infection which is good news and overall the patient states he is  actually happier with where things stand at this point. 12/13/2019 on evaluation today patient unfortunately is very dry with cracked skin noted at this point. Fortunately there is no signs of active infection at this time. With that being said I think he is really needs some moisturizing lotion something really good to counter loosen up the dry cracked skin I think even if 40% urea cream would be ideal at this point. I discussed this with him today. In fact even look this up on his phone so that he can order it if he needs to although he may be able to find it locally I am just not sure. I told him to check with his pharmacy first. 12/20/2019 on evaluation today patient unfortunately continues to have issues with swelling of his left lower extremity. We did use Tubigrip last week to try to give him a chance to be able to take this off and take a shower and then reapply it. With that being said he just cut it off and then did not have anything to reapply he did that on Friday after we saw him on Thursday and had nothing on in the interim for almost an entire week since. Obviously that was not the direction that was given to him and when I asked him about it he stated that he just could not bend over to get off and he does not have anyone to help him. With that being said I do not think this is can be possible to have him take this off to take a shower simply due to the fact that again were not going to be able to keep the edema under control and he can get this off and back on it  hurts his back way too much. 12/27/2019 on evaluation today patient actually appears to be mostly dry in regard to his lower extremity wounds in general. Fortunately there is no signs of active infection at this time. No fever chills noted. I really do not see anything open at this point which is good news. He does have a lot of dry skin that is flaking off. With that being said I think that likely I recommend wrapping him 1 more  week and we can utilize the objective coating layer from the Profore wraps in order to prevent anything from sticking to the wound bed. After that I am hopeful we will be able to get him into the compression socks that he has he supposed to bring 1 of those with him next week he also is supposed to switch out and start using the new ones on his right leg as when he has is completely worn out. 01/10/2020 upon evaluation today patient appears to be doing poorly at this point in regard to his lower extremities on the left. He still continues to have significant weeping he actually took the wrap off 2 days ago because he stated "we were planning to take it off today anyway". He did not however put any compression stockings on but obviously was not part of the plan. Still he continues to have significant swelling I really do not know that this is going to make a significant improvement and less were able to keep him well compressed all the time and again also get his edema under good control. He was supposed to go back to see his cardiologist although I do not think that is actually been an appointment that he has made at this point that I can find anyway. He also is still taking the double dose of his Lasix but again I am not sure that that is doing the job here. 01/17/2020 upon evaluation today patient appears to be doing quite well with regard to his leg all things considered. This is better than last week. With that being said he still has significant edema and weeping unfortunately this is going to continue to be an issue. There is no signs of active infection. He tells me that he got a new prescription for torsemide. T be honest I was not quite sure that he had been prescribed that yet I thought he had been prescribed Lasix but either o way I think there may be a little bit of confusion here. I discussed this with the patient today as well. I also discussed with the patient the fact that I do believe he  is going to likely require additional follow-up with his cardiologist. I suggested he give them a call to make an appointment. He told me that he called them last week but he did not make an appointment. I am not exactly sure what he called in for or what he was looking for out of that. Nonetheless there does not appear to be any signs of active infection at this time. No fevers, chills, nausea, vomiting, or diarrhea. 01/24/2020 upon evaluation today patient appears to be doing poorly in regard to his left lower extremity. He has bright green almost teal colored drainage which is very consistent with likely Pseudomonas. He also has a Pseudomonas-like smell to the drainage noted at this point. Fortunately there is no signs of active systemic infection at this time. No fevers, chills, nausea, vomiting, or diarrhea. With that being said I explained to  the patient that this can change quickly that he can definitely become septic as a result of what we are seeing here if he does not treat this appropriately. With that being said I really think he needs to be in the ER as soon as possible in fact I really want him to go today he tells me there is no way he is likely to be able to get that set up in time. He takes care of his wife who is legally blind and I completely understand the constraints there nonetheless he is going to take care of himself if he is good to be around to take care of her. 01/31/2020 upon evaluation today patient unfortunately appears to be doing poorly yet again. There is no signs of systemic infection yet although that is a big concern for me at this point to be honest. No fevers, chills, nausea, vomiting, or diarrhea. With regard to the oral antibiotics at this point the patient does have a prescription for trazodone which I was under the impression he was taking. He states that he is not really sure that he is taking that on a regular basis or not. T be honest is on his list he tells me  that in his pill bottle he has set pills he takes every day and he is really unsure of exactly what he takes. He needs to go o back and have a look at that and try to figure things out. Either way if we were to place him on something like Cipro or Levaquin with him still being on trazodone that can prolong the QT interval which can be also dangerous. I am not willing to do that. The patient really needs to go to the hospital for in my opinion IV antibiotic therapy. He was positive for Pseudomonas and when he came in today his dressing was completely saturated with blue-green drainage consistent with Pseudomonas as well that I saw even before I saw the results of the culture and already knew what we were dealing with. ADMISSION 02/07/2020 This is a 69 year old man followed for a prolonged period of time at our sister clinic in [place]. When he was seen last week he was felt to have cellulitis with a greenish drainage and he was sent to the hospital. He has lymphedema chronic venous insufficiency and was being treated for a wound on his left lower leg. He is also a type II diabetic although the patient states he is not taking anything for his diabetes and does not monitor his blood sugar. He was admitted to [facility]. A discharge summary is not yet available but he was just discharged yesterday. In the hospital he was felt to have chronic or acute on chronic systolic heart failure. He was treated with IV Lasix. Felt his dry weight was 128 kg. He was aggressively changed to torsemide 40 twice daily. With regards to his lower extremity edema he was treated with an Haematologist. He was sent here because this was felt to be closer to his actual home which is in [facility]. I do not believe he received antibiotics. Other issues included paroxysmal atrial fibrillation and nonsustained V. tach with PVCs. He came in the clinic today with the Unna boots looking to be a mass. This was according to  our intake nurse 02/28/2020; the patient I saw once 3 weeks ago. He had severe edema likely secondary to chronic venous insufficiency and lymphedema with superficial wounds on his posterior lower  leg but marked stasis dermatitis. We put TCA and ketoconazole in his skin silver alginate and put him on compression. All that is the last we have seen of him. He tells me he took the wrap off 2 days ago i.e. it stayed on for 2-1/2 weeks. He also told us that he was in hospital however we could see no such hospitalization. 5/6; the patient was last here on 20 April for a nurse visit. He missed an appointment with [place] and then kept the wrap on till about a week ago when he was taken off at his primary doctor's office. The wound surface therefore is a lot larger. He has uncontrolled edema in the left leg. 04/10/20 - Patient presents at 2 weeks, his leg is a little better, however his buttock area is covered across the midline on both sides with white topped flat papules almost cauliflower configuration extending all the way from the top of the gluteal cleft all the way down to the bottom, there is significant intertriginous moisture and some degree of maceration open areas are noted here. Patient denies any burning or pain however there is some itching and there is some burning with rubbing on the area. 6/3; the patient's wound is better on his left leg still open and crusted around the circumference it is small. He has severe bilateral venous inflammation chronic venous insufficiency. He is going to need ongoing compression with stockings however I am not sure exactly how were going to maintain skin integrity in this leg. Nevertheless he is close to healing in this area. When he was here 2 weeks ago he had a light rash on his buttocks that looked possibly to kneel to me. I gave him some Lotrisone. He came in last week with this a lot worse and indeed today with a horrible looking rash involving his gluteal cleft  all the way down to the perianal. There is some satellite pustular areas. He did not stay for the lab work that Dr. Evette Doffing ordered. We did arrange for him to have a dermatology appointment this afternoon. I wonder if this is herpes simplex. He does have perianal and intertriginous moisture and maceration. I think a lot of this is open as well. 6/10; the patient has no open wounds on his bilateral legs but he still has severe chronic venous insufficiency with stage III lymphedema. He did not obtain his stockings He went to see [facility] dermatology this was arranged by Dr. Evette Doffing. He was given a combination of fluconazole for 7 days, Keflex for 7 days and triamcinolone. They did a culture of this and surprisingly this showed Pseudomonas aeruginosa. He was therefore put on ciprofloxacin so he is on 2 antibiotics and antifungal and getting triamcinolone. He still says the rash is very painful Electronic Signature(s) Signed: 04/28/2020 8:32:12 AM By: Linton Ham MD Entered By: Linton Ham on 04/24/2020 09:38:23 -------------------------------------------------------------------------------- Physical Exam Details Patient Name: Date of Service: [name], Treyshon M. 04/24/2020 8:00 A M Medical Record Number: [id number] Patient Account Number: [id number] Date of Birth/Sex: Treating RN: [birth year] (69 y.o. [name] Primary Care Provider: Aura Dials Other Clinician: Referring Provider: Treating Provider/Extender: Gabriel Carina in Treatment: 11 Constitutional Patient is hypertensive.. Pulse regular and within target range for patient.Marland Kitchen Respirations regular, non-labored and within target range.. Temperature is normal and within the target range for the patient.Marland Kitchen Appears in no distress. Cardiovascular Pedal pulses are palpable. Nonpitting edema right greater than left. Integumentary (Hair, Skin) Skin changes of  lymphedema in his bilateral lower legs. The rash  on his buttocks looks a lot more fungal today than it did when I initially saw this or last week. I think the correct course of action here would be Diflucan plus ketoconazole. I will look at it this again next week but it looks better today. Notes Wound exam; He has been healed in his bilateral lower extremities. For second straight week he comes in with no compression stockings. I do not think the right leg will hold without compression for that reason I have rewrapped him today but I will not do it again and I told him that. Electronic Signature(s) Signed: 04/28/2020 8:32:12 AM By: Linton Ham MD Entered By: Linton Ham on 04/24/2020 09:41:50 -------------------------------------------------------------------------------- Physician Orders Details Patient Name: Date of Service: [name], Bishop M. 04/24/2020 8:00 A M Medical Record Number: [id number] Patient Account Number: [id number] Date of Birth/Sex: Treating RN: [birth year] (69 y.o. [name] Primary Care Provider: Aura Dials Other Clinician: Referring Provider: Treating Provider/Extender: Gabriel Carina in Treatment: 11 Verbal / Phone Orders: No Diagnosis Coding ICD-10 Coding Code Description [id number] Non-pressure chronic ulcer of left calf limited to breakdown of skin I89.0 Lymphedema, not elsewhere classified I87.322 Chronic venous hypertension (idiopathic) with inflammation of left lower extremity T03.54 Acute systolic (congestive) heart failure R21 Rash and other nonspecific skin eruption Follow-up Appointments ppointment in 1 week. - Thursday Return A Skin Barriers/Peri-Wound Care TCA Cream or Ointment - mixed with lotion both legs. Wound Cleansing May shower and wash wound with soap and water. - use a cast protector. Edema Control 4 layer compression - Bilateral Avoid standing for long periods of time Elevate legs to the level of the heart or above for 30 minutes daily and/or  when sitting, a frequency of: - throughout the day. Support Garment 20-30 mm/Hg pressure to: - ****patient to order compression stockings from [employer]. Please bring stockings in next week to apply.**** Additional Orders / Instructions Other: - patient to follow dermatology instructions for buttock rash. [facility] for wound care. - [facility] change once a week. Wound [facility] once a week. Electronic Signature(s) Signed: 04/24/2020 5:25:48 PM By: Deon Pilling Signed: 04/28/2020 8:32:12 AM By: Linton Ham MD Entered By: Deon Pilling on 04/24/2020 08:39:42 -------------------------------------------------------------------------------- Problem List Details Patient Name: Date of Service: [name], Reise M. 04/24/2020 8:00 A M Medical Record Number: [id number] Patient Account Number: [id number] Date of Birth/Sex: Treating RN: [birth year] (69 y.o. [name] Primary Care Provider: Aura Dials Other Clinician: Referring Provider: Treating Provider/Extender: Gabriel Carina in Treatment: 11 Active Problems ICD-10 Encounter Code Description Active Date MDM Diagnosis L97.221 Non-pressure chronic ulcer of left calf limited to breakdown of skin 02/07/2020 No Yes I89.0 Lymphedema, not elsewhere classified 02/07/2020 No Yes I87.322 Chronic venous hypertension (idiopathic) with inflammation of left lower 02/07/2020 No Yes extremity Z00.17 Acute systolic (congestive) heart failure 02/07/2020 No Yes R21 Rash and other nonspecific skin eruption 04/10/2020 No Yes Inactive Problems Resolved Problems Electronic Signature(s) Signed: 04/28/2020 8:32:12 AM By: Linton Ham MD Entered By: Linton Ham on 04/24/2020 09:35:44 -------------------------------------------------------------------------------- Progress Note Details Patient Name: Date of Service: [name], Micael M. 04/24/2020 8:00 A M Medical Record Number:  [id number] Patient Account Number: [id number] Date of Birth/Sex: Treating RN: 27-Jan-1951 (69 y.o. [name] Primary Care Provider: Aura Dials Other Clinician: Referring Provider: Treating Provider/Extender: Gabriel Carina in Treatment: 11 Subjective History of Present Illness (HPI) [place] History  of Present Illness (HPI) 09/27/2019 on evaluation today patient appears to be doing poorly in regard to his bilateral lower extremities he does have bilateral lower extremity lymphedema. With that being said he also has a history of diabetes type 2, hypertension, congestive heart failure, and apparently has chronic venous insufficiency which has led to the lymphedema. Currently he also shows signs of an infection of the left lower extremity which does have me concerned at this point as well. He tells me that he is not really having any significant pain but has had a lot of trouble with his legs in general. He does have a compression stocking he is wearing on the right lower extremity he was wrapped with an Unna boot on the left lower extremity. The patient tells me that overall he has been doing with this for several weeks and home health has been initiated. He has not been on any antibiotics yet and he tells me that the home health nurse was not sure that it was infected based on what I see today I am concerned that this likely is infected at this point. Fortunately however there does not appear to be any evidence of systemic infection which is good news. The Unna boot does seem to be helping control the edema although he may benefit from a stronger compression wrap I think that at this point without having the ABIs completed the Unna boot is where I would stand. He is supposed to be having an arterial study with ABI as ordered by his cardiologist but he states he may have missed that appointment and has not called to reschedule he needs to contact them to get this  rescheduled as soon as possible I explained to him. He tells me that he is draining a lot as far as the left lower extremity is concerned and that is something that he feels like is not lasting very long as far as the wraps are concerned when they put him on they seem to get wet extremely quickly. 10/04/2019 on evaluation today patient appears to be doing better compared to last week's evaluation. Fortunately there is no signs of active infection at this time. He has been tolerating the dressing changes without complication. He did undergo arterial studies yesterday and has an appointment tomorrow with the vascular specialist. With that being said his ABI on the left was 1.25 on the right 1.19 with a TBI of 0.61 on the left and 0.69 on the right. There was stated to be no evidence of obvious significant arterial disease and the patient had pretty much triphasic blood flow throughout. Overall I am very pleased with how things seem to be progressing. We will see if the vascular doctor says anything different tomorrow but I even at this point I feel like he would do well with a stronger compression wrap to try to get some the fluid out of his leg at this point. The good news is his drainage has slowed down considerably I do believe the antibiotics have been beneficial for him. 10/18/2019 on evaluation today patient appears to be doing poorly in regard to his left lower extremity. He still having a lot of drainage and weeping from his leg this appears to be much more erythematous and in fact I am concerned that the erythema may be spreading up his leg as well. He has not experienced any fevers, chills, nausea, vomiting, or diarrhea up to this point. I explained to him that I really feel like based  on what I am seeing he may need to potentially go to the hospital for evaluation and treatment potentially even with IV antibiotics depending on what they see. With that being said he tells me that he is not really  able to do that due to the fact that he is the primary caregiver for his wife who is legally blind. He states he is not good to be able to have anything to help as far as caring for her if he were to go into the hospital. Nonetheless he is unsure what to do in this regard. I really think that he needs faster treatment. For that reason I am going to likely try to get in touch with the infectious disease office to see if I can get him in sooner than [place]. 10/25/2019 on evaluation today patient appears to be doing a little better in regard to his weeping and edema compared to the last time I saw him. The dorsal surface of his foot is a little bit drier compared to what was this is good news. He did see Dr. Drucilla Schmidt on 10/22/2019. Dr. Drucilla Schmidt did feel that potentially this could be secondary to some cellulitis and he recommended subsequently placing the patient on Zyvox which the patient feels like has helped as well. There does not appear to be any signs of active infection at this time which is good news systemically. I am very pleased that Dr. Drucilla Schmidt was able to see him so quickly I do appreciate this greatly. With that being said he also did recommend an MRI for the patient to evaluate for any deeper signs of infection. That is actually scheduled for this coming Tuesday. He also has a follow-up appoint with Dr. Drucilla Schmidt on Tuesday. The last thing Dr. Drucilla Schmidt did mention was the possibility of more aggressive diuresis. The patient is on torsemide. With that being said this is managed by his cardiologist. I think we may want to get in touch with her clinic and see if possibly this is something that could be increased for a short amount of time depending on their recommendations to see if we can get some additional fluid off of him which in turn would likely help tremendously with his weeping and edema. He is in agreement with this plan if his cardiologist is in agreement. 11/01/2019 on evaluation today  patient unfortunately notes that he did not get his MRI done which was ordered by Dr. Drucilla Schmidt. Apparently when he went in it was 6:30 in the morning and they told him that he was there to get an MRI of his toes. With that being said the MRI was ordered of his ankle and foot and so long story short the patient said that he was not getting MRI of his toes and left. Dr. Drucilla Schmidt was alerted and again it does not appear that at all that was what he ordered that he did order the ankle and foot but nonetheless that is beside the point at this time. The patient still has considerable edema noted at this time the nurse that came out last after just put a Kerlix and Coban wrap on the patient she did not even properly wrap him. Subsequently they have also discharged him being that they state he is not skilled at this point. Therefore he is going to have to be seen here in the office only. Fortunately there is no signs of systemic infection he still has a lot of erythema around the ankle and lower extremity  region again there are any specific open wounds just general weeping and evidence of erythema which Dr. Drucilla Schmidt is questioning whether or not this is even infection or if it is just more secondary to the patient's lymphedema. He has had venous reflux studies which were negative for any abnormal findings unfortunately that is also not good to be an answer for [place] here. I have recommended the patient today contact his primary care provider to see if they can increase his fluid pills and I also think we need to increase his compression wrap to a 4-layer compression wrap to try to more effectively manage his edema. 11/08/2019 on evaluation today patient appears unfortunately to still be doing poorly. I did speak with his primary care provider office last week they were supposed to call him to advise what should be done as far as his fluid pills were concerned. With that being said they did not contact him as far as the  patient tells me. I am not really sure exactly what is going on as far as that is concerned. Nonetheless I really think that the patient needs to be admitted to the hospital for treatment likely as I explained to him he is going to require IV Lasix to get fluid off and to be honest also think he may need IV antibiotics. I have made all the outpatient referrals I can to try to get things under control he has been on oral antibiotics I really just have not seen any significant improvement he was even on linezolid. That was prescribed By infectious disease. Overall I am very concerned about the fact that this is not getting better. 11/22/2019 on evaluation today patient unfortunately is not doing very well with regard to his lower extremity edema. The left lower extremity is significantly swollen and still significantly erythematous. He has bright green drainage. He did just see Dr. Drucilla Schmidt on 11/19/2019 and Dr. Drucilla Schmidt did not feel like that this was infected. Subsequently he states that he feels like the issue is more 1 of not being at maximal diuresis and recommended the patient see his cardiologist to ensure that this was achieved. Subsequently Dr. Drucilla Schmidt states this is a issue with venous stasis dermatitis which again I completely understand that that may definitely be the case but unfortunately we've not been able to get things under control even with maximum compression at a 4 layer compression wrap. The patient is still having significant issues. Either way I do think the patient is getting need to follow-up with his cardiologist to see what we can do Dr. Drucilla Schmidt did make mention that the patient had a flareup of infection that Keflex 500 mg 4 times a day or Augmentin twice a day would be good options for nonpurulent cellulitis. The patient was again supposed to have gone to the hospital on the weekend following Christmas after he got things settled with family as far as getting someone to take care of  his wife. With that being said he tells me that when it came around to that time he just didn't think about it. When asked him about why the drainage from his leg and the issues he is having that and remind him he told me "well it really hasn't changed". 11/29/2019 on evaluation today patient presents for follow-up concerning his left lower extremity edema and weeping. He did see his cardiologist at my request after I discussed with him the situation as well. This is Binnie Kand who is a family nurse  practitioner that he saw. Subsequently the patient was recommended to start metolazone 5 mg daily for the next 5 days. They also recommended that really he needed essentially bed rest with elevation of his legs over the next week. With that being said the patient states that he is unable to even get in bed due to the fact that he has back trouble and has to sleep in his recliner. He never really gets in the bed at all. This is probably part of the issue coupled with his heart not functioning quite as well as what we would like to see and then subsequently he also has the issue of being overweight which contributes to this as well. Overall were still trying to figure out how to best manage this to get his tweaking under control and prevent anything from worsening. Again Dr. Drucilla Schmidt really did not feel like there was any evidence of infectious processes going on at this point. 12/06/2019 on evaluation today patient's leg actually appears to be doing better with regard to edema control I am actually pleased in this regard. He also seems to be improving as far as drainage is concerned from the leg this is much better than what it was in the past. Fortunately there is no signs of active infection which is good news and overall the patient states he is actually happier with where things stand at this point. 12/13/2019 on evaluation today patient unfortunately is very dry with cracked skin noted at this point.  Fortunately there is no signs of active infection at this time. With that being said I think he is really needs some moisturizing lotion something really good to counter loosen up the dry cracked skin I think even if 40% urea cream would be ideal at this point. I discussed this with him today. In fact even look this up on his phone so that he can order it if he needs to although he may be able to find it locally I am just not sure. I told him to check with his pharmacy first. 12/20/2019 on evaluation today patient unfortunately continues to have issues with swelling of his left lower extremity. We did use Tubigrip last week to try to give him a chance to be able to take this off and take a shower and then reapply it. With that being said he just cut it off and then did not have anything to reapply he did that on Friday after we saw him on Thursday and had nothing on in the interim for almost an entire week since. Obviously that was not the direction that was given to him and when I asked him about it he stated that he just could not bend over to get off and he does not have anyone to help him. With that being said I do not think this is can be possible to have him take this off to take a shower simply due to the fact that again were not going to be able to keep the edema under control and he can get this off and back on it hurts his back way too much. 12/27/2019 on evaluation today patient actually appears to be mostly dry in regard to his lower extremity wounds in general. Fortunately there is no signs of active infection at this time. No fever chills noted. I really do not see anything open at this point which is good news. He does have a lot of dry skin that is flaking off. With that  being said I think that likely I recommend wrapping him 1 more week and we can utilize the objective coating layer from the Profore wraps in order to prevent anything from sticking to the wound bed. After that I am hopeful  we will be able to get him into the compression socks that he has he supposed to bring 1 of those with him next week he also is supposed to switch out and start using the new ones on his right leg as when he has is completely worn out. 01/10/2020 upon evaluation today patient appears to be doing poorly at this point in regard to his lower extremities on the left. He still continues to have significant weeping he actually took the wrap off 2 days ago because he stated "we were planning to take it off today anyway". He did not however put any compression stockings on but obviously was not part of the plan. Still he continues to have significant swelling I really do not know that this is going to make a significant improvement and less were able to keep him well compressed all the time and again also get his edema under good control. He was supposed to go back to see his cardiologist although I do not think that is actually been an appointment that he has made at this point that I can find anyway. He also is still taking the double dose of his Lasix but again I am not sure that that is doing the job here. 01/17/2020 upon evaluation today patient appears to be doing quite well with regard to his leg all things considered. This is better than last week. With that being said he still has significant edema and weeping unfortunately this is going to continue to be an issue. There is no signs of active infection. He tells me that he got a new prescription for torsemide. T be honest I was not quite sure that he had been prescribed that yet I thought he had been prescribed Lasix but either o way I think there may be a little bit of confusion here. I discussed this with the patient today as well. I also discussed with the patient the fact that I do believe he is going to likely require additional follow-up with his cardiologist. I suggested he give them a call to make an appointment. He told me that he called  them last week but he did not make an appointment. I am not exactly sure what he called in for or what he was looking for out of that. Nonetheless there does not appear to be any signs of active infection at this time. No fevers, chills, nausea, vomiting, or diarrhea. 01/24/2020 upon evaluation today patient appears to be doing poorly in regard to his left lower extremity. He has bright green almost teal colored drainage which is very consistent with likely Pseudomonas. He also has a Pseudomonas-like smell to the drainage noted at this point. Fortunately there is no signs of active systemic infection at this time. No fevers, chills, nausea, vomiting, or diarrhea. With that being said I explained to the patient that this can change quickly that he can definitely become septic as a result of what we are seeing here if he does not treat this appropriately. With that being said I really think he needs to be in the ER as soon as possible in fact I really want him to go today he tells me there is no way he is likely to be able  to get that set up in time. He takes care of his wife who is legally blind and I completely understand the constraints there nonetheless he is going to take care of himself if he is good to be around to take care of her. 01/31/2020 upon evaluation today patient unfortunately appears to be doing poorly yet again. There is no signs of systemic infection yet although that is a big concern for me at this point to be honest. No fevers, chills, nausea, vomiting, or diarrhea. With regard to the oral antibiotics at this point the patient does have a prescription for trazodone which I was under the impression he was taking. He states that he is not really sure that he is taking that on a regular basis or not. T be honest is on his list he tells me that in his pill bottle he has set pills he takes every day and he is really unsure of exactly what he takes. He needs to go o back and have a look at  that and try to figure things out. Either way if we were to place him on something like Cipro or Levaquin with him still being on trazodone that can prolong the QT interval which can be also dangerous. I am not willing to do that. The patient really needs to go to the hospital for in my opinion IV antibiotic therapy. He was positive for Pseudomonas and when he came in today his dressing was completely saturated with blue-green drainage consistent with Pseudomonas as well that I saw even before I saw the results of the culture and already knew what we were dealing with. ADMISSION 02/07/2020 This is a 69 year old man followed for a prolonged period of time at our sister clinic in [place]. When he was seen last week he was felt to have cellulitis with a greenish drainage and he was sent to the hospital. He has lymphedema chronic venous insufficiency and was being treated for a wound on his left lower leg. He is also a type II diabetic although the patient states he is not taking anything for his diabetes and does not monitor his blood sugar. He was admitted to [facility]. A discharge summary is not yet available but he was just discharged yesterday. In the hospital he was felt to have chronic or acute on chronic systolic heart failure. He was treated with IV Lasix. Felt his dry weight was 128 kg. He was aggressively changed to torsemide 40 twice daily. With regards to his lower extremity edema he was treated with an Haematologist. He was sent here because this was felt to be closer to his actual home which is in [facility]. I do not believe he received antibiotics. Other issues included paroxysmal atrial fibrillation and nonsustained V. tach with PVCs. He came in the clinic today with the Unna boots looking to be a mass. This was according to our intake nurse 02/28/2020; the patient I saw once 3 weeks ago. He had severe edema likely secondary to chronic venous insufficiency and lymphedema with  superficial wounds on his posterior lower leg but marked stasis dermatitis. We put TCA and ketoconazole in his skin silver alginate and put him on compression. All that is the last we have seen of him. He tells me he took the wrap off 2 days ago i.e. it stayed on for 2-1/2 weeks. He also told us that he was in hospital however we could see no such hospitalization. 5/6; the patient was last  here on 20 April for a nurse visit. He missed an appointment with [place] and then kept the wrap on till about a week ago when he was taken off at his primary doctor's office. The wound surface therefore is a lot larger. He has uncontrolled edema in the left leg. 04/10/20 - Patient presents at 2 weeks, his leg is a little better, however his buttock area is covered across the midline on both sides with white topped flat papules almost cauliflower configuration extending all the way from the top of the gluteal cleft all the way down to the bottom, there is significant intertriginous moisture and some degree of maceration open areas are noted here. Patient denies any burning or pain however there is some itching and there is some burning with rubbing on the area. 6/3; the patient's wound is better on his left leg still open and crusted around the circumference it is small. He has severe bilateral venous inflammation chronic venous insufficiency. He is going to need ongoing compression with stockings however I am not sure exactly how were going to maintain skin integrity in this leg. Nevertheless he is close to healing in this area. When he was here 2 weeks ago he had a light rash on his buttocks that looked possibly to kneel to me. I gave him some Lotrisone. He came in last week with this a lot worse and indeed today with a horrible looking rash involving his gluteal cleft all the way down to the perianal. There is some satellite pustular areas. He did not stay for the lab work that Dr. Evette Doffing ordered. We did arrange for  him to have a dermatology appointment this afternoon. I wonder if this is herpes simplex. He does have perianal and intertriginous moisture and maceration. I think a lot of this is open as well. 6/10; the patient has no open wounds on his bilateral legs but he still has severe chronic venous insufficiency with stage III lymphedema. He did not obtain his stockings He went to see [facility] dermatology this was arranged by Dr. Evette Doffing. He was given a combination of fluconazole for 7 days, Keflex for 7 days and triamcinolone. They did a culture of this and surprisingly this showed Pseudomonas aeruginosa. He was therefore put on ciprofloxacin so he is on 2 antibiotics and antifungal and getting triamcinolone. He still says the rash is very painful Objective Constitutional Patient is hypertensive.. Pulse regular and within target range for patient.Marland Kitchen Respirations regular, non-labored and within target range.. Temperature is normal and within the target range for the patient.Marland Kitchen Appears in no distress. Vitals Time Taken: 8:10 AM, Height: 69 in, Weight: 280 lbs, BMI: 41.3, Temperature: 98.2 F, Pulse: 98 bpm, Respiratory Rate: 18 breaths/min, Blood Pressure: 138/91 mmHg. Cardiovascular Pedal pulses are palpable. Nonpitting edema right greater than left. General Notes: Wound exam; ooHe has been healed in his bilateral lower extremities. For second straight week he comes in with no compression stockings. I do not think the right leg will hold without compression for that reason I have rewrapped him today but I will not do it again and I told him that. oo Integumentary (Hair, Skin) Skin changes of lymphedema in his bilateral lower legs. The rash on his buttocks looks a lot more fungal today than it did when I initially saw this or last week. I think the correct course of action here would be Diflucan plus ketoconazole. I will look at it this again next week but it looks better today. Wound #1  status is  Open. Original cause of wound was Gradually Appeared. The wound is located on the Left,Circumferential Lower Leg. The wound measures 0cm length x 0cm width x 0cm depth; 0cm^2 area and 0cm^3 volume. There is no tunneling or undermining noted. There is a none present amount of drainage noted. The wound margin is indistinct and nonvisible. There is no granulation within the wound bed. There is no necrotic tissue within the wound bed. Assessment Active Problems ICD-10 Non-pressure chronic ulcer of left calf limited to breakdown of skin Lymphedema, not elsewhere classified Chronic venous hypertension (idiopathic) with inflammation of left lower extremity Acute systolic (congestive) heart failure Rash and other nonspecific skin eruption Procedures There was a Four Layer Compression Therapy Procedure by Carlene Coria, RN. Post procedure Diagnosis Wound #: Same as Pre-Procedure Plan Follow-up Appointments: Return Appointment in 1 week. - Thursday Skin Barriers/Peri-Wound Care: TCA Cream or Ointment - mixed with lotion both legs. Wound Cleansing: May shower and wash wound with soap and water. - use a cast protector. Edema Control: 4 layer compression - Bilateral Avoid standing for long periods of time Elevate legs to the level of the heart or above for 30 minutes daily and/or when sitting, a frequency of: - throughout the day. Support Garment 20-30 mm/Hg pressure to: - ****patient to order compression stockings from [facility]. Please bring stockings in next week to apply.**** Additional Orders / Instructions: Other: - patient to follow dermatology instructions for buttock rash. Home Health: [facility] for wound care. - [facility] once a week. Wound Center once a week. 1. I put him in 4 layer compression bilaterally 2. I think his extensive rash in his bilateral buttocks, gluteal cleft and scrotum is all fungal it. I am a little more convinced about this and  I was last week. I would extend his Diflucan and ketoconazole. 3. The Pseudomonas cultured I am not sure what exact role this had in things but certainly needs to be treated. I do not think he has a follow-up with dermatology Electronic Signature(s) Signed: 04/28/2020 8:32:12 AM By: Linton Ham MD Entered By: Linton Ham on 04/24/2020 09:43:45 -------------------------------------------------------------------------------- SuperBill Details Patient Name: Date of Service: [name], [place] M. 04/24/2020 Medical Record Number: [id number] Patient Account Number: [id number] Date of Birth/Sex: Treating RN: January 06, 1951 (69 y.o. [name] Primary Care Provider: Aura Dials Other Clinician: Referring Provider: Treating Provider/Extender: Gabriel Carina in Treatment: 11 Diagnosis Coding ICD-10 Codes Code Description [id number] Non-pressure chronic ulcer of left calf limited to breakdown of skin I89.0 Lymphedema, not elsewhere classified I87.322 Chronic venous hypertension (idiopathic) with inflammation of left lower extremity R74.08 Acute systolic (congestive) heart failure R21 Rash and other nonspecific skin eruption Facility Procedures Physician Procedures : CPT4 Code Description Modifier [id number] - WC PHYS LEVEL 3 - EST PT ICD-10 Diagnosis Description L97.221 Non-pressure chronic ulcer of left calf limited to breakdown of skin I89.0 Lymphedema, not elsewhere classified I87.322 Chronic venous  hypertension (idiopathic) with inflammation of left lower extremity Quantity: 1 Electronic Signature(s) Signed: 04/28/2020 8:32:12 AM By: Linton Ham MD Entered By: Linton Ham on 04/24/2020 09:44:15

## 2020-04-30 NOTE — Progress Notes (Addendum)
[name], [name] ([id number]) Visit Report for 04/24/2020 Arrival Information Details Patient Name: Date of Service: [name], [name] 04/24/2020 8:00 A M Medical Record Number: [id number] Patient Account Number: [id number] Date of Birth/Sex: Treating RN: 07/31/1951 (69 y.o. [name]) Carlene Coria Primary Care Tenisha Fleece: Aura Dials Other Clinician: Referring Davelle Anselmi: Treating Stpehen Petitjean/Extender: Gabriel Carina in Treatment: 11 Visit Information History Since Last Visit All ordered tests and consults were completed: No Patient Arrived: Ambulatory Added or deleted any medications: No Arrival Time: 08:05 Any new allergies or adverse reactions: No Accompanied By: self Had a fall or experienced change in No Transfer Assistance: None activities of daily living that may affect Patient Identification Verified: Yes risk of falls: Secondary Verification Process Completed: Yes Signs or symptoms of abuse/neglect since last visito No Patient Requires Transmission-Based Precautions: No Hospitalized since last visit: No Patient Has Alerts: Yes Implantable device outside of the clinic excluding No Patient Alerts: Patient on Blood Thinner cellular tissue based products placed in the center ABI, BIL: non compress since last visit: Has Dressing in Place as Prescribed: Yes Has Compression in Place as Prescribed: Yes Pain Present Now: No Electronic Signature(s) Signed: 04/30/2020 9:54:21 AM By: Carlene Coria RN Entered By: Carlene Coria on 04/24/2020 08:10:22 -------------------------------------------------------------------------------- Compression Therapy Details Patient Name: Date of Service: [name], [name] 04/24/2020 8:00 A M Medical Record Number: [id number] Patient Account Number: [id number] Date of Birth/Sex: Treating RN: 29-Apr-1951 (69 y.o. [name] Primary Care Esker Dever: Aura Dials Other Clinician: Referring Korianna Washer: Treating Bethan Adamek/Extender: Gabriel Carina in Treatment: 11 Compression Therapy Performed for Wound Assessment: NonWound Condition Lymphedema - Bilateral Leg Performed By: Clinician Carlene Coria, RN Compression Type: Four Layer Post Procedure Diagnosis Same as Pre-procedure Electronic Signature(s) Signed: 04/24/2020 5:25:48 PM By: Deon Pilling Entered By: Deon Pilling on 04/24/2020 08:40:14 -------------------------------------------------------------------------------- Encounter Discharge Information Details Patient Name: Date of Service: [name], [name] 04/24/2020 8:00 A M Medical Record Number: [id number] Patient Account Number: [id number] Date of Birth/Sex: Treating RN: [birth year] (68 y.o. [name]) Carlene Coria Primary Care Raya Mckinstry: Aura Dials Other Clinician: Referring Jrue Jarriel: Treating Mc Bloodworth/Extender: Gabriel Carina in Treatment: 11 Encounter Discharge Information Items Discharge Condition: Stable Ambulatory Status: Ambulatory Discharge Destination: Home Transportation: Private Auto Accompanied By: self Schedule Follow-up Appointment: Yes Clinical Summary of Care: Patient Declined Electronic Signature(s) Signed: 04/30/2020 9:54:21 AM By: Carlene Coria RN Entered By: Carlene Coria on 04/24/2020 09:07:02 -------------------------------------------------------------------------------- Lower Extremity Assessment Details Patient Name: Date of Service: [name], [name]. 04/24/2020 8:00 A M Medical Record Number: [id number] Patient Account Number: [id number] Date of Birth/Sex: Treating RN: 06/02/1951 (69 y.o. [name]) Carlene Coria Primary Care Katrinna Travieso: Aura Dials Other Clinician: Referring Garrette Caine: Treating Moriyah Byington/Extender: Gabriel Carina in Treatment: 11 Edema Assessment Assessed: Shirlyn Goltz: No] Patrice Paradise: No] Edema: [Left: Ye] [Right: s] Calf Left: Right: Point of Measurement: 42 cm From Medial Instep 43.5 cm cm Ankle Left:  Right: Point of Measurement: 17 cm From Medial Instep 28.5 cm cm Electronic Signature(s) Signed: 04/30/2020 9:54:21 AM By: Carlene Coria RN Entered By: Carlene Coria on 04/24/2020 08:16:52 -------------------------------------------------------------------------------- Multi Wound Chart Details Patient Name: Date of Service: [name], [name] 04/24/2020 8:00 A M Medical Record Number: [id number] Patient Account Number: [id number] Date of Birth/Sex: Treating RN: [birth year] (69 y.o. [name] Primary Care Johanna Matto: Aura Dials Other Clinician: Referring Arda Keadle: Treating Shelaine Frie/Extender: Gabriel Carina in Treatment: 11 Vital Signs Height(in): 69 Pulse(bpm): 77 Weight(lbs): 280 Blood Pressure(mmHg): 138/91 Body Mass  Index(BMI): 41 Temperature(F): 98.2 Respiratory Rate(breaths/min): 18 Photos: [1:No Photos Left, Circumferential Lower Leg] [N/A:N/A N/A] Wound Location: [1:Gradually Appeared] [N/A:N/A] Wounding Event: [1:Lymphedema] [N/A:N/A] Primary Etiology: [1:Arrhythmia, Congestive Heart Failure,] [N/A:N/A] Comorbid History: [1:Hypertension, Type II Diabetes 07/18/2019] [N/A:N/A] Date Acquired: [1:11] [N/A:N/A] Weeks of Treatment: [1:Open] [N/A:N/A] Wound Status: [1:0x0x0] [N/A:N/A] Measurements L x W x D (cm) [1:0] [N/A:N/A] A (cm) : rea [1:0] [N/A:N/A] Volume (cm) : [1:100.00%] [N/A:N/A] % Reduction in Area: [1:100.00%] [N/A:N/A] % Reduction in Volume: [1:Full Thickness Without Exposed] [N/A:N/A] Classification: [1:Support Structures None Present] [N/A:N/A] Exudate Amount: [1:Indistinct, nonvisible] [N/A:N/A] Wound Margin: [1:None Present (0%)] [N/A:N/A] Granulation Amount: [1:None Present (0%)] [N/A:N/A] Necrotic Amount: [1:Fascia: No] [N/A:N/A] Exposed Structures: [1:Fat Layer (Subcutaneous Tissue) Exposed: No Tendon: No Muscle: No Joint: No Bone: No Large (67-100%)] [N/A:N/A] Treatment Notes Electronic Signature(s) Signed:  04/24/2020 5:25:48 PM By: Deon Pilling Signed: 04/28/2020 8:32:12 AM By: Linton Ham MD Entered By: Linton Ham on 04/24/2020 09:35:50 -------------------------------------------------------------------------------- Multi-Disciplinary Care Plan Details Patient Name: Date of Service: [name], Lanell M. 04/24/2020 8:00 A M Medical Record Number: [id number] Patient Account Number: [id number] Date of Birth/Sex: Treating RN: 06/13/1951 (69 y.o. [name] Primary Care Margaret Staggs: Aura Dials Other Clinician: Referring Caiden Arteaga: Treating Anaya Bovee/Extender: Gabriel Carina in Treatment: 11 Active Inactive Electronic Signature(s) Signed: 05/08/2020 5:34:37 PM By: Deon Pilling Signed: 05/27/2020 7:39:20 AM By: Kela Millin Signed: 05/27/2020 7:39:20 AM By: Kela Millin Previous Signature: 04/24/2020 5:25:48 PM Version By: Deon Pilling Entered By: Kela Millin on 05/08/2020 09:50:53 -------------------------------------------------------------------------------- Non-Wound Condition Assessment Details Patient Name: Date of Service: [name], Tregan M. 04/24/2020 8:00 A M Medical Record Number: [id number] Patient Account Number: [id number] Date of Birth/Sex: Treating RN: [birth year] (68 y.o. [name]) Carlene Coria Primary Care Ellison Rieth: Aura Dials Other Clinician: Referring Yariana Hoaglund: Treating Kree Armato/Extender: Gabriel Carina in Treatment: 11 Non-Wound Condition: Condition: Lymphedema Location: Leg Side: Bilateral Notes right leg with 2 plus pitting edema Electronic Signature(s) Signed: 04/30/2020 9:54:21 AM By: Carlene Coria RN Entered By: Carlene Coria on 04/24/2020 08:17:59 -------------------------------------------------------------------------------- Non-Wound Condition Assessment Details Patient Name: Date of Service: [name], [name]. 04/24/2020 8:00 A M Medical Record Number: [id number] Patient Account Number:  [id number] Date of Birth/Sex: Treating RN: 1951/04/20 (70 y.o. [name]) Carlene Coria Primary Care Ardel Jagger: Aura Dials Other Clinician: Referring Lakeisa Heninger: Treating Bain Whichard/Extender: Gabriel Carina in Treatment: 11 Non-Wound Condition: Condition: Rash / Dermatitis Location: Gluteus Side: Bilateral Notes: pustule rash to bil gluteus near midline. pt reporting discomfort with sitting Notes red , swollen , irritated Electronic Signature(s) Signed: 04/30/2020 9:54:21 AM By: Carlene Coria RN Entered By: Carlene Coria on 04/24/2020 08:20:37 -------------------------------------------------------------------------------- Pain Assessment Details Patient Name: Date of Service: [name], Goku M. 04/24/2020 8:00 A M Medical Record Number: [id number] Patient Account Number: [id number] Date of Birth/Sex: Treating RN: 1951-07-16 (69 y.o. [name]) Carlene Coria Primary Care Javione Gunawan: Other Clinician: Aura Dials Referring Fenris Cauble: Treating Syon Tews/Extender: Gabriel Carina in Treatment: 11 Active Problems Location of Pain Severity and Description of Pain Patient Has Paino No Site Locations Pain Management and Medication Current Pain Management: Electronic Signature(s) Signed: 04/30/2020 9:54:21 AM By: Carlene Coria RN Entered By: Carlene Coria on 04/24/2020 08:11:16 -------------------------------------------------------------------------------- Patient/Caregiver Education Details Patient Name: Date of Service: [name], Barnabas M. 6/10/2021andnbsp8:00 A M Medical Record Number: [id number] Patient Account Number: [id number] Date of Birth/Gender: Treating RN: 09-11-1951 (69 y.o. [name] Primary Care Physician: Aura Dials Other Clinician: Referring Physician: Treating Physician/Extender: Gabriel Carina in Treatment: [phone]  Education Assessment Education Provided To: Patient Education Topics Provided Wound/Skin  Impairment: Handouts: Skin Care Do's and Dont's Methods: Explain/Verbal Responses: Reinforcements needed Electronic Signature(s) Signed: 04/24/2020 5:25:48 PM By: Deon Pilling Entered By: Deon Pilling on 04/24/2020 08:09:18 -------------------------------------------------------------------------------- Wound Assessment Details Patient Name: Date of Service: [name], Lieutenant M. 04/24/2020 8:00 A M Medical Record Number: [id number] Patient Account Number: [id number] Date of Birth/Sex: Treating RN: Jul 01, 1951 (69 y.o. [name]) Carlene Coria Primary Care Malick Netz: Aura Dials Other Clinician: Referring Tanayah Squitieri: Treating Judas Mohammad/Extender: Gabriel Carina in Treatment: 11 Wound Status Wound Number: 1 Primary Lymphedema Etiology: Wound Location: Left, Circumferential Lower Leg Wound Status: Open Wounding Event: Gradually Appeared Comorbid Arrhythmia, Congestive Heart Failure, Hypertension, Type II Date Acquired: 07/18/2019 History: Diabetes Weeks Of Treatment: 11 Clustered Wound: No Wound Measurements Length: (cm) Width: (cm) Depth: (cm) Area: (cm) Volume: (cm) 0 % Reduction in Area: 100% 0 % Reduction in Volume: 100% 0 Epithelialization: Large (67-100%) 0 Tunneling: No 0 Undermining: No Wound Description Classification: Full Thickness Without Exposed Support Structures Wound Margin: Indistinct, nonvisible Exudate Amount: None Present Foul Odor After Cleansing: No Slough/Fibrino No Wound Bed Granulation Amount: None Present (0%) Exposed Structure Necrotic Amount: None Present (0%) Fascia Exposed: No Fat Layer (Subcutaneous Tissue) Exposed: No Tendon Exposed: No Muscle Exposed: No Joint Exposed: No Bone Exposed: No Electronic Signature(s) Signed: 04/30/2020 9:54:21 AM By: Carlene Coria RN Entered By: Carlene Coria on 04/24/2020 08:17:16 -------------------------------------------------------------------------------- [place] Details Patient  Name: Date of Service: [name], Keric M. 04/24/2020 8:00 A M Medical Record Number: [id number] Patient Account Number: [id number] Date of Birth/Sex: Treating RN: 1951-01-05 (69 y.o. [name]) Carlene Coria Primary Care Anson Peddie: Aura Dials Other Clinician: Referring Jacqualynn Parco: Treating Lafonda Patron/Extender: Gabriel Carina in Treatment: 11 Vital Signs Time Taken: 08:10 Temperature (F): 98.2 Height (in): 69 Pulse (bpm): 98 Weight (lbs): 280 Respiratory Rate (breaths/min): 18 Body Mass Index (BMI): 41.3 Blood Pressure (mmHg): 138/91 Reference Range: 80 - 120 mg / dl Electronic Signature(s) Signed: 04/30/2020 9:54:21 AM By: Carlene Coria RN Entered By: Carlene Coria on 04/24/2020 08:11:08

## 2020-05-01 ENCOUNTER — Encounter (HOSPITAL_BASED_OUTPATIENT_CLINIC_OR_DEPARTMENT_OTHER): Payer: Medicare Other | Admitting: Internal Medicine

## 2020-05-08 ENCOUNTER — Encounter (HOSPITAL_BASED_OUTPATIENT_CLINIC_OR_DEPARTMENT_OTHER): Payer: Medicare Other | Admitting: Internal Medicine

## 2020-05-08 ENCOUNTER — Other Ambulatory Visit: Payer: Self-pay

## 2020-05-08 DIAGNOSIS — I87322 Chronic venous hypertension (idiopathic) with inflammation of left lower extremity: Secondary | ICD-10-CM | POA: Diagnosis not present

## 2020-05-08 NOTE — Progress Notes (Signed)
VERNICE, BOWKER ([id number]) Visit Report for 05/08/2020 Arrival Information Details Patient Name: Date of Service: REDMO NDKelcey, Wickstrom. 05/08/2020 9:30 A M Medical Record Number: [id number] Patient Account Number: [id number] Date of Birth/Sex: Treating RN: 1951-07-11 (69 y.o. Marvis Repress Primary Care Brolin Dambrosia: Aura Dials Other Clinician: Referring Cheryn Lundquist: Treating Herminio Kniskern/Extender: Gabriel Carina in Treatment: 36 Visit Information History Since Last Visit Added or deleted any medications: No Patient Arrived: Ambulatory Any new allergies or adverse reactions: No Arrival Time: 09:33 Had a fall or experienced change in No Accompanied By: self activities of daily living that may affect Transfer Assistance: None risk of falls: Patient Identification Verified: Yes Signs or symptoms of abuse/neglect since last visito No Secondary Verification Process Completed: Yes Hospitalized since last visit: No Patient Requires Transmission-Based Precautions: No Implantable device outside of the clinic excluding No Patient Has Alerts: Yes cellular tissue based products placed in the center Patient Alerts: Patient on Blood Thinner since last visit: ABI, BIL: non compress Has Dressing in Place as Prescribed: No Has Compression in Place as Prescribed: No Pain Present Now: No Electronic Signature(s) Signed: 05/08/2020 5:29:15 PM By: Kela Millin Entered By: Kela Millin on 05/08/2020 09:41:03 -------------------------------------------------------------------------------- Clinic Level of Care Assessment Details Patient Name: Date of Service: REDMO NDJerran, Tappan 05/08/2020 9:30 A M Medical Record Number: [id number] Patient Account Number: [id number] Date of Birth/Sex: Treating RN: 1951/02/05 (69 y.o. Marvis Repress Primary Care Keiondre Colee: Aura Dials Other Clinician: Referring Madalynne Gutmann: Treating Reiley Bertagnolli/Extender: Gabriel Carina in Treatment: 13 Clinic Level of Care Assessment Items TOOL 4 Quantity Score X- 1 0 Use when only an EandM is performed on FOLLOW-UP visit ASSESSMENTS - Nursing Assessment / Reassessment X- 1 10 Reassessment of Co-morbidities (includes updates in patient status) X- 1 5 Reassessment of Adherence to Treatment Plan ASSESSMENTS - Wound and Skin A ssessment / Reassessment X - Simple Wound Assessment / Reassessment - one wound 1 5 []  - 0 Complex Wound Assessment / Reassessment - multiple wounds []  - 0 Dermatologic / Skin Assessment (not related to wound area) ASSESSMENTS - Focused Assessment []  - 0 Circumferential Edema Measurements - multi extremities []  - 0 Nutritional Assessment / Counseling / Intervention []  - 0 Lower Extremity Assessment (monofilament, tuning fork, pulses) []  - 0 Peripheral Arterial Disease Assessment (using hand held doppler) ASSESSMENTS - Ostomy and/or Continence Assessment and Care []  - 0 Incontinence Assessment and Management []  - 0 Ostomy Care Assessment and Management (repouching, etc.) PROCESS - Coordination of Care X - Simple Patient / Family Education for ongoing care 1 15 []  - 0 Complex (extensive) Patient / Family Education for ongoing care X- 1 10 Staff obtains Programmer, systems, Records, T Results / Process Orders est []  - 0 Staff telephones HHA, Nursing Homes / Clarify orders / etc []  - 0 Routine Transfer to another Facility (non-emergent condition) []  - 0 Routine Hospital Admission (non-emergent condition) []  - 0 New Admissions / Biomedical engineer / Ordering NPWT Apligraf, etc. , []  - 0 Emergency Hospital Admission (emergent condition) X- 1 10 Simple Discharge Coordination []  - 0 Complex (extensive) Discharge Coordination PROCESS - Special Needs []  - 0 Pediatric / Minor Patient Management []  - 0 Isolation Patient Management []  - 0 Hearing / Language / Visual special needs []  - 0 Assessment of Community assistance  (transportation, D/C planning, etc.) []  - 0 Additional assistance / Altered mentation []  - 0 Support Surface(s) Assessment (bed, cushion, seat, etc.) INTERVENTIONS - Wound Cleansing / Measurement X -  Simple Wound Cleansing - one wound 1 5 []  - 0 Complex Wound Cleansing - multiple wounds []  - 0 Wound Imaging (photographs - any number of wounds) []  - 0 Wound Tracing (instead of photographs) []  - 0 Simple Wound Measurement - one wound []  - 0 Complex Wound Measurement - multiple wounds INTERVENTIONS - Wound Dressings []  - 0 Small Wound Dressing one or multiple wounds []  - 0 Medium Wound Dressing one or multiple wounds []  - 0 Large Wound Dressing one or multiple wounds []  - 0 Application of Medications - topical []  - 0 Application of Medications - injection INTERVENTIONS - Miscellaneous []  - 0 External ear exam []  - 0 Specimen Collection (cultures, biopsies, blood, body fluids, etc.) []  - 0 Specimen(s) / Culture(s) sent or taken to Lab for analysis []  - 0 Patient Transfer (multiple staff / Civil Service fast streamer / Similar devices) []  - 0 Simple Staple / Suture removal (25 or less) []  - 0 Complex Staple / Suture removal (26 or more) []  - 0 Hypo / Hyperglycemic Management (close monitor of Blood Glucose) []  - 0 Ankle / Brachial Index (ABI) - do not check if billed separately X- 1 5 Vital Signs Has the patient been seen at the hospital within the last three years: Yes Total Score: 65 Level Of Care: New/Established - Level 2 Electronic Signature(s) Signed: 05/08/2020 5:29:15 PM By: Kela Millin Entered By: Kela Millin on 05/08/2020 09:50:09 -------------------------------------------------------------------------------- Encounter Discharge Information Details Patient Name: Date of Service: [name], [name]. 05/08/2020 9:30 A M Medical Record Number: [id number] Patient Account Number: [id number] Date of Birth/Sex: Treating RN: Dec 07, 1950 (69 y.o. Marvis Repress Primary Care Maevyn Riordan: Aura Dials Other Clinician: Referring Labradford Schnitker: Treating Christalynn Boise/Extender: Gabriel Carina in Treatment: 13 Encounter Discharge Information Items Discharge Condition: Stable Ambulatory Status: Ambulatory Discharge Destination: Home Transportation: Private Auto Accompanied By: self Schedule Follow-up Appointment: No Clinical Summary of Care: Notes patient is healed. T wear stockings bilaterally. o Electronic Signature(s) Signed: 05/08/2020 5:29:15 PM By: Kela Millin Entered By: Kela Millin on 05/08/2020 09:45:21 -------------------------------------------------------------------------------- Patient/Caregiver Education Details Patient Name: Date of Service: [name], Eann M. 6/24/2021andnbsp9:30 A M Medical Record Number: [id number] Patient Account Number: [id number] Date of Birth/Gender: Treating RN: 1951-10-30 (69 y.o. Marvis Repress Primary Care Physician: Aura Dials Other Clinician: Referring Physician: Treating Physician/Extender: Gabriel Carina in Treatment: 13 Education Assessment Education Provided To: Patient Education Topics Provided Wound/Skin Impairment: Handouts: Caring for Your Ulcer Methods: Explain/Verbal Responses: State content correctly Electronic Signature(s) Signed: 05/08/2020 5:29:15 PM By: Kela Millin Entered By: Kela Millin on 05/08/2020 09:44:50 -------------------------------------------------------------------------------- Vitals Details Patient Name: Date of Service: [name], Cardell M. 05/08/2020 9:30 A M Medical Record Number: [id number] Patient Account Number: [id number] Date of Birth/Sex: Treating RN: [birth year] (69 y.o. Marvis Repress Primary Care Doye Montilla: Aura Dials Other Clinician: Referring Greyden Besecker: Treating Samaya Boardley/Extender: Gabriel Carina in Treatment: 13 Vital Signs Time Taken:  09:40 Temperature (F): 98.1 Height (in): 69 Pulse (bpm): 106 Weight (lbs): 280 Respiratory Rate (breaths/min): 19 Body Mass Index (BMI): 41.3 Blood Pressure (mmHg): 146/89 Reference Range: 80 - 120 mg / dl Electronic Signature(s) Signed: 05/08/2020 5:29:15 PM By: Kela Millin Entered By: Kela Millin on 05/08/2020 09:41:51

## 2020-05-09 NOTE — Progress Notes (Signed)
LISLE, SKILLMAN ([id number]) Visit Report for 05/08/2020 SuperBill Details Patient Name: Date of Service: REDMO NDWoods, Gangemi 05/08/2020 Medical Record Number: [id number] Patient Account Number: [id number] Date of Birth/Sex: Treating RN: November 23, 1950 (69 y.o. Marvis Repress Primary Care Provider: Aura Dials Other Clinician: Referring Provider: Treating Provider/Extender: Gabriel Carina in Treatment: 13 Diagnosis Coding ICD-10 Codes Code Description [phone] Non-pressure chronic ulcer of left calf limited to breakdown of skin I89.0 Lymphedema, not elsewhere classified I87.322 Chronic venous hypertension (idiopathic) with inflammation of left lower extremity U03.70 Acute systolic (congestive) heart failure R21 Rash and other nonspecific skin eruption Facility Procedures The patient participates with Medicare or their insurance follows the Medicare Facility Guidelines CPT4 Code Description Modifier Quantity [id number] - WOUND CARE VISIT-LEV 2 EST PT 1 Electronic Signature(s) Signed: 05/08/2020 5:29:15 PM By: Kela Millin Signed: 05/09/2020 5:27:17 PM By: Linton Ham MD Entered By: Kela Millin on 05/08/2020 09:50:19

## 2020-05-09 NOTE — Progress Notes (Signed)
MABRY, TIFT ([id number]) Visit Report for 01/31/2020 Arrival Information Details Patient Name: [name], [name]. Date of Service: 01/31/2020 8:30 AM Medical Record Number: [id number] Patient Account Number: [id number] Date of Birth/Sex: 05-16-1951 (69 y.o. M) Treating RN: Cornell Barman Primary Care Aveyah Greenwood: Aura Dials Other Clinician: Referring Terrel Manalo: Aura Dials Treating Orby Tangen/Extender: Melburn Hake, HOYT Weeks in Treatment: 18 Visit Information History Since Last Visit Added or deleted any medications: No Patient Arrived: Ambulatory Any new allergies or adverse reactions: No Arrival Time: 08:36 Had a fall or experienced change in No Accompanied By: self activities of daily living that may affect Transfer Assistance: None risk of falls: Patient Identification Verified: Yes Signs or symptoms of abuse/neglect since last visito No Secondary Verification Process Completed: Yes Hospitalized since last visit: No Patient Requires Transmission-Based No Implantable device outside of the clinic excluding No Precautions: cellular tissue based products placed in the center Patient Has Alerts: Yes since last visit: Patient Alerts: DM II Has Dressing in Place as Prescribed: Yes ABI11/18/20 L 1.25 R Has Compression in Place as Prescribed: Yes 1.19 Pain Present Now: No Electronic Signature(s) Signed: 01/31/2020 3:52:07 PM By: Lorine Bears RCP, RRT, CHT Entered By: Becky Sax, Amado Nash on 01/31/2020 08:36:57 Vanhorne, [name] ([id number]) -------------------------------------------------------------------------------- Clinic Level of Care Assessment Details Patient Name: [name]. Date of Service: 01/31/2020 8:30 AM Medical Record Number: [id number] Patient Account Number: [id number] Date of Birth/Sex: [birth year] (69 y.o. M) Treating RN: Montey Hora Primary Care Kaily Wragg: Aura Dials Other Clinician: Referring Valli Randol: Aura Dials Treating Jshawn Hurta/Extender: Melburn Hake, HOYT Weeks in Treatment: 18 Clinic Level of Care Assessment Items TOOL 4 Quantity Score []  - Use when only an EandM is performed on FOLLOW-UP visit 0 ASSESSMENTS - Nursing Assessment / Reassessment X - Reassessment of Co-morbidities (includes updates in patient status) 1 10 X- 1 5 Reassessment of Adherence to Treatment Plan ASSESSMENTS - Wound and Skin Assessment / Reassessment []  - Simple Wound Assessment / Reassessment - one wound 0 []  - 0 Complex Wound Assessment / Reassessment - multiple wounds X- 1 10 Dermatologic / Skin Assessment (not related to wound area) ASSESSMENTS - Focused Assessment X - Circumferential Edema Measurements - multi extremities 1 5 []  - 0 Nutritional Assessment / Counseling / Intervention X- 1 5 Lower Extremity Assessment (monofilament, tuning fork, pulses) []  - 0 Peripheral Arterial Disease Assessment (using hand held doppler) ASSESSMENTS - Ostomy and/or Continence Assessment and Care []  - Incontinence Assessment and Management 0 []  - 0 Ostomy Care Assessment and Management (repouching, etc.) PROCESS - Coordination of Care X - Simple Patient / Family Education for ongoing care 1 15 []  - 0 Complex (extensive) Patient / Family Education for ongoing care X- 1 10 Staff obtains Programmer, systems, Records, Test Results / Process Orders []  - 0 Staff telephones HHA, Nursing Homes / Clarify orders / etc []  - 0 Routine Transfer to another Facility (non-emergent condition) []  - 0 Routine Hospital Admission (non-emergent condition) []  - 0 New Admissions / Biomedical engineer / Ordering NPWT, Apligraf, etc. []  - 0 Emergency Hospital Admission (emergent condition) X- 1 10 Simple Discharge Coordination []  - 0 Complex (extensive) Discharge Coordination PROCESS - Special Needs []  - Pediatric / Minor Patient Management 0 []  - 0 Isolation Patient Management []  - 0 Hearing / Language / Visual special needs []  -  0 Assessment of Community assistance (transportation, D/C planning, etc.) []  - 0 Additional assistance / Altered mentation []  - 0 Support Surface(s) Assessment (bed, cushion, seat, etc.) INTERVENTIONS - Wound Cleansing /  Measurement Ciampa, [name]. ([id number]) []  - 0 Simple Wound Cleansing - one wound []  - 0 Complex Wound Cleansing - multiple wounds X- 1 5 Wound Imaging (photographs - any number of wounds) []  - 0 Wound Tracing (instead of photographs) []  - 0 Simple Wound Measurement - one wound []  - 0 Complex Wound Measurement - multiple wounds INTERVENTIONS - Wound Dressings []  - Small Wound Dressing one or multiple wounds 0 X- 1 15 Medium Wound Dressing one or multiple wounds []  - 0 Large Wound Dressing one or multiple wounds []  - 0 Application of Medications - topical []  - 0 Application of Medications - injection INTERVENTIONS - Miscellaneous []  - External ear exam 0 []  - 0 Specimen Collection (cultures, biopsies, blood, body fluids, etc.) []  - 0 Specimen(s) / Culture(s) sent or taken to Lab for analysis []  - 0 Patient Transfer (multiple staff / Civil Service fast streamer / Similar devices) []  - 0 Simple Staple / Suture removal (25 or less) []  - 0 Complex Staple / Suture removal (26 or more) []  - 0 Hypo / Hyperglycemic Management (close monitor of Blood Glucose) []  - 0 Ankle / Brachial Index (ABI) - do not check if billed separately X- 1 5 Vital Signs Has the patient been seen at the hospital within the last three years: Yes Total Score: 95 Level Of Care: New/Established - Level 3 Electronic Signature(s) Signed: 01/31/2020 4:33:07 PM By: Montey Hora Entered By: Montey Hora on 01/31/2020 09:00:19 [name], [name] ([id number]) -------------------------------------------------------------------------------- Encounter Discharge Information Details Patient Name: [name]. Date of Service: 01/31/2020 8:30 AM Medical Record Number: [id number] Patient Account  Number: [id number] Date of Birth/Sex: Aug 03, 1951 (69 y.o. M) Treating RN: Montey Hora Primary Care Keala Drum: Aura Dials Other Clinician: Referring Amar Keenum: Aura Dials Treating Dominika Losey/Extender: Melburn Hake, HOYT Weeks in Treatment: 6 Encounter Discharge Information Items Discharge Condition: Stable Ambulatory Status: Ambulatory Discharge Destination: Home Transportation: Private Auto Accompanied By: self Schedule Follow-up Appointment: Yes Clinical Summary of Care: Electronic Signature(s) Signed: 01/31/2020 4:33:07 PM By: Montey Hora Entered By: Montey Hora on 01/31/2020 09:01:17 [name], [name] ([id number]) -------------------------------------------------------------------------------- Lower Extremity Assessment Details Patient Name: [name]. Date of Service: 01/31/2020 8:30 AM Medical Record Number: [id number] Patient Account Number: [id number] Date of Birth/Sex: Aug 30, 1951 (69 y.o. M) Treating RN: Montey Hora Primary Care Gavino Fouch: Aura Dials Other Clinician: Referring Clemma Johnsen: Aura Dials Treating Jessi Jessop/Extender: Melburn Hake, HOYT Weeks in Treatment: 18 Edema Assessment Assessed: [Left: No] [Right: No] Edema: [Left: Ye] [Right: s] Calf Left: Right: Point of Measurement: 36 cm From Medial Instep 52.5 cm cm Ankle Left: Right: Point of Measurement: 12 cm From Medial Instep 32.5 cm cm Vascular Assessment Pulses: Dorsalis Pedis Palpable: [Left:Yes] Electronic Signature(s) Signed: 01/31/2020 4:33:07 PM By: Montey Hora Entered By: Montey Hora on 01/31/2020 08:45:23 [name], [name] ([id number]) -------------------------------------------------------------------------------- Multi Wound Chart Details Patient Name: [name]. Date of Service: 01/31/2020 8:30 AM Medical Record Number: [id number] Patient Account Number: [id number] Date of Birth/Sex: 01-13-1951 (69 y.o. M) Treating RN: Montey Hora Primary Care Aaliyah Cancro:  Aura Dials Other Clinician: Referring Alechia Lezama: Aura Dials Treating Jaynie Hitch/Extender: Melburn Hake, HOYT Weeks in Treatment: 18 Vital Signs Height(in): 69 Pulse(bpm): 99 Weight(lbs): 607 Blood Pressure(mmHg): 134/78 Body Mass Index(BMI): 44 Temperature(F): 98.1 Respiratory Rate(breaths/min): 18 Wound Assessments Treatment Notes Electronic Signature(s) Signed: 01/31/2020 4:33:07 PM By: Montey Hora Entered By: Montey Hora on 01/31/2020 08:54:51 [name], [name] ([id number]) -------------------------------------------------------------------------------- Multi-Disciplinary Care Plan Details Patient Name: [name]. Date of Service: 01/31/2020 8:30 AM Medical Record Number: [id number] Patient  Account Number: [id number] Date of Birth/Sex: 01-18-1951 (69 y.o. M) Treating RN: Montey Hora Primary Care Erlinda Solinger: Aura Dials Other Clinician: Referring Kayvon Mo: Aura Dials Treating Arling Cerone/Extender: Melburn Hake, HOYT Weeks in Treatment: 71 Active Inactive Electronic Signature(s) Signed: 02/18/2020 3:07:23 PM By: Gretta Cool BSN, RN, CWS, Kim RN, BSN Signed: 05/09/2020 10:50:41 AM By: Montey Hora Previous Signature: 01/31/2020 4:33:07 PM Version By: Montey Hora Entered By: Gretta Cool BSN, RN, CWS, Kim on 02/18/2020 15:07:22 [name], [name] ([id number]) -------------------------------------------------------------------------------- Non-Wound Condition Assessment Details Patient Name: JAMIRE, SHABAZZ. Date of Service: 01/31/2020 8:30 AM Medical Record Number: [id number] Patient Account Number: [id number] Date of Birth/Sex: Dec 27, 1950 (69 y.o. M) Treating RN: Montey Hora Primary Care Gera Inboden: Aura Dials Other Clinician: Referring Treyshaun Keatts: Aura Dials Treating Nyan Dufresne/Extender: STONE III, HOYT Weeks in Treatment: 18 Non-Wound Condition: Condition: Lymphedema Location: Leg Side: Bilateral Photos Electronic Signature(s) Signed: 01/31/2020 4:33:07  PM By: Montey Hora Entered By: Montey Hora on 01/31/2020 08:46:22 [name], [name] ([id number]) -------------------------------------------------------------------------------- Pain Assessment Details Patient Name: [name]. Date of Service: 01/31/2020 8:30 AM Medical Record Number: [id number] Patient Account Number: [id number] Date of Birth/Sex: 01/07/1951 (69 y.o. M) Treating RN: Montey Hora Primary Care Shevelle Smither: Aura Dials Other Clinician: Referring Shaw Dobek: Aura Dials Treating Jrake Rodriquez/Extender: Melburn Hake, HOYT Weeks in Treatment: 18 Active Problems Location of Pain Severity and Description of Pain Patient Has Paino No Site Locations Pain Management and Medication Current Pain Management: Electronic Signature(s) Signed: 01/31/2020 4:33:07 PM By: Montey Hora Entered By: Montey Hora on 01/31/2020 08:44:00 [name], [name] ([id number]) -------------------------------------------------------------------------------- Patient/Caregiver Education Details Patient Name: [name]. Date of Service: 01/31/2020 8:30 AM Medical Record Number: [id number] Patient Account Number: [id number] Date of Birth/Gender: 04/19/1951 (69 y.o. M) Treating RN: Montey Hora Primary Care Physician: Aura Dials Other Clinician: Referring Physician: Aura Dials Treating Physician/Extender: Sharalyn Ink in Treatment: 58 Education Assessment Education Provided To: Patient Education Topics Provided Wound/Skin Impairment: Handouts: Other: need to go to ED Methods: Explain/Verbal Responses: State content correctly Electronic Signature(s) Signed: 01/31/2020 4:33:07 PM By: Montey Hora Entered By: Montey Hora on 01/31/2020 09:00:39 [name], [name] ([id number]) -------------------------------------------------------------------------------- Vitals Details Patient Name: [name]. Date of Service: 01/31/2020 8:30 AM Medical Record Number:  [id number] Patient Account Number: [id number] Date of Birth/Sex: 09/25/1951 (69 y.o. M) Treating RN: Cornell Barman Primary Care Husam Hohn: Aura Dials Other Clinician: Referring Alyssa Rotondo: Aura Dials Treating Kymorah Korf/Extender: Melburn Hake, HOYT Weeks in Treatment: 18 Vital Signs Time Taken: 08:35 Temperature (F): 98.1 Height (in): 69 Pulse (bpm): 99 Weight (lbs): 295 Respiratory Rate (breaths/min): 18 Body Mass Index (BMI): 43.6 Blood Pressure (mmHg): 134/78 Reference Range: 80 - 120 mg / dl Electronic Signature(s) Signed: 01/31/2020 3:52:07 PM By: Lorine Bears RCP, RRT, CHT Entered By: Lorine Bears on 01/31/2020 08:40:59

## 2020-09-05 ENCOUNTER — Ambulatory Visit (HOSPITAL_BASED_OUTPATIENT_CLINIC_OR_DEPARTMENT_OTHER): Payer: Medicare Other | Admitting: Internal Medicine

## 2020-09-05 ENCOUNTER — Encounter (HOSPITAL_BASED_OUTPATIENT_CLINIC_OR_DEPARTMENT_OTHER): Payer: Medicare Other | Admitting: Internal Medicine

## 2020-09-08 ENCOUNTER — Inpatient Hospital Stay (HOSPITAL_COMMUNITY): Payer: Medicare Other

## 2020-09-08 ENCOUNTER — Inpatient Hospital Stay (HOSPITAL_COMMUNITY)
Admission: EM | Admit: 2020-09-08 | Discharge: 2020-10-01 | DRG: 853 | Discharge status: Against Medical Advice | Payer: Medicare Other | Attending: Internal Medicine | Admitting: Internal Medicine

## 2020-09-08 ENCOUNTER — Emergency Department (HOSPITAL_COMMUNITY): Payer: Medicare Other

## 2020-09-08 ENCOUNTER — Encounter (HOSPITAL_COMMUNITY): Payer: Self-pay | Admitting: *Deleted

## 2020-09-08 DIAGNOSIS — U071 COVID-19: Secondary | ICD-10-CM | POA: Diagnosis not present

## 2020-09-08 DIAGNOSIS — Z79899 Other long term (current) drug therapy: Secondary | ICD-10-CM

## 2020-09-08 DIAGNOSIS — R319 Hematuria, unspecified: Secondary | ICD-10-CM | POA: Diagnosis not present

## 2020-09-08 DIAGNOSIS — I4819 Other persistent atrial fibrillation: Secondary | ICD-10-CM | POA: Diagnosis present

## 2020-09-08 DIAGNOSIS — I5033 Acute on chronic diastolic (congestive) heart failure: Secondary | ICD-10-CM | POA: Diagnosis not present

## 2020-09-08 DIAGNOSIS — M869 Osteomyelitis, unspecified: Secondary | ICD-10-CM | POA: Diagnosis not present

## 2020-09-08 DIAGNOSIS — L97519 Non-pressure chronic ulcer of other part of right foot with unspecified severity: Secondary | ICD-10-CM | POA: Diagnosis present

## 2020-09-08 DIAGNOSIS — Z7901 Long term (current) use of anticoagulants: Secondary | ICD-10-CM

## 2020-09-08 DIAGNOSIS — N1832 Chronic kidney disease, stage 3b: Secondary | ICD-10-CM | POA: Diagnosis not present

## 2020-09-08 DIAGNOSIS — N401 Enlarged prostate with lower urinary tract symptoms: Secondary | ICD-10-CM | POA: Diagnosis present

## 2020-09-08 DIAGNOSIS — E11628 Type 2 diabetes mellitus with other skin complications: Secondary | ICD-10-CM | POA: Diagnosis present

## 2020-09-08 DIAGNOSIS — M86171 Other acute osteomyelitis, right ankle and foot: Principal | ICD-10-CM | POA: Diagnosis present

## 2020-09-08 DIAGNOSIS — N186 End stage renal disease: Secondary | ICD-10-CM | POA: Diagnosis not present

## 2020-09-08 DIAGNOSIS — I872 Venous insufficiency (chronic) (peripheral): Secondary | ICD-10-CM | POA: Diagnosis present

## 2020-09-08 DIAGNOSIS — T82838A Hemorrhage of vascular prosthetic devices, implants and grafts, initial encounter: Secondary | ICD-10-CM | POA: Diagnosis not present

## 2020-09-08 DIAGNOSIS — I4891 Unspecified atrial fibrillation: Secondary | ICD-10-CM | POA: Diagnosis not present

## 2020-09-08 DIAGNOSIS — R609 Edema, unspecified: Secondary | ICD-10-CM

## 2020-09-08 DIAGNOSIS — E1165 Type 2 diabetes mellitus with hyperglycemia: Secondary | ICD-10-CM | POA: Diagnosis present

## 2020-09-08 DIAGNOSIS — L8990 Pressure ulcer of unspecified site, unspecified stage: Secondary | ICD-10-CM | POA: Insufficient documentation

## 2020-09-08 DIAGNOSIS — L97509 Non-pressure chronic ulcer of other part of unspecified foot with unspecified severity: Secondary | ICD-10-CM | POA: Diagnosis not present

## 2020-09-08 DIAGNOSIS — I10 Essential (primary) hypertension: Secondary | ICD-10-CM | POA: Diagnosis present

## 2020-09-08 DIAGNOSIS — Z96641 Presence of right artificial hip joint: Secondary | ICD-10-CM | POA: Diagnosis present

## 2020-09-08 DIAGNOSIS — Z992 Dependence on renal dialysis: Secondary | ICD-10-CM | POA: Diagnosis not present

## 2020-09-08 DIAGNOSIS — K219 Gastro-esophageal reflux disease without esophagitis: Secondary | ICD-10-CM | POA: Diagnosis present

## 2020-09-08 DIAGNOSIS — Z515 Encounter for palliative care: Secondary | ICD-10-CM

## 2020-09-08 DIAGNOSIS — L03116 Cellulitis of left lower limb: Secondary | ICD-10-CM | POA: Diagnosis present

## 2020-09-08 DIAGNOSIS — L089 Local infection of the skin and subcutaneous tissue, unspecified: Secondary | ICD-10-CM | POA: Diagnosis not present

## 2020-09-08 DIAGNOSIS — R338 Other retention of urine: Secondary | ICD-10-CM | POA: Diagnosis present

## 2020-09-08 DIAGNOSIS — Z7189 Other specified counseling: Secondary | ICD-10-CM | POA: Diagnosis not present

## 2020-09-08 DIAGNOSIS — I5043 Acute on chronic combined systolic (congestive) and diastolic (congestive) heart failure: Secondary | ICD-10-CM | POA: Diagnosis present

## 2020-09-08 DIAGNOSIS — M86271 Subacute osteomyelitis, right ankle and foot: Secondary | ICD-10-CM | POA: Diagnosis not present

## 2020-09-08 DIAGNOSIS — R6520 Severe sepsis without septic shock: Secondary | ICD-10-CM | POA: Diagnosis present

## 2020-09-08 DIAGNOSIS — L97419 Non-pressure chronic ulcer of right heel and midfoot with unspecified severity: Secondary | ICD-10-CM | POA: Diagnosis present

## 2020-09-08 DIAGNOSIS — L89316 Pressure-induced deep tissue damage of right buttock: Secondary | ICD-10-CM | POA: Diagnosis not present

## 2020-09-08 DIAGNOSIS — E1169 Type 2 diabetes mellitus with other specified complication: Secondary | ICD-10-CM | POA: Diagnosis present

## 2020-09-08 DIAGNOSIS — E11621 Type 2 diabetes mellitus with foot ulcer: Secondary | ICD-10-CM | POA: Diagnosis present

## 2020-09-08 DIAGNOSIS — W1830XA Fall on same level, unspecified, initial encounter: Secondary | ICD-10-CM | POA: Diagnosis present

## 2020-09-08 DIAGNOSIS — G4700 Insomnia, unspecified: Secondary | ICD-10-CM | POA: Diagnosis present

## 2020-09-08 DIAGNOSIS — Z9114 Patient's other noncompliance with medication regimen: Secondary | ICD-10-CM

## 2020-09-08 DIAGNOSIS — N170 Acute kidney failure with tubular necrosis: Secondary | ICD-10-CM | POA: Diagnosis not present

## 2020-09-08 DIAGNOSIS — A419 Sepsis, unspecified organism: Secondary | ICD-10-CM | POA: Diagnosis present

## 2020-09-08 DIAGNOSIS — I953 Hypotension of hemodialysis: Secondary | ICD-10-CM | POA: Diagnosis not present

## 2020-09-08 DIAGNOSIS — Z713 Dietary counseling and surveillance: Secondary | ICD-10-CM

## 2020-09-08 DIAGNOSIS — W19XXXA Unspecified fall, initial encounter: Secondary | ICD-10-CM

## 2020-09-08 DIAGNOSIS — J1282 Pneumonia due to coronavirus disease 2019: Secondary | ICD-10-CM | POA: Diagnosis not present

## 2020-09-08 DIAGNOSIS — E1122 Type 2 diabetes mellitus with diabetic chronic kidney disease: Secondary | ICD-10-CM | POA: Diagnosis present

## 2020-09-08 DIAGNOSIS — Z6841 Body Mass Index (BMI) 40.0 and over, adult: Secondary | ICD-10-CM | POA: Diagnosis not present

## 2020-09-08 DIAGNOSIS — Z452 Encounter for adjustment and management of vascular access device: Secondary | ICD-10-CM

## 2020-09-08 DIAGNOSIS — N179 Acute kidney failure, unspecified: Secondary | ICD-10-CM

## 2020-09-08 DIAGNOSIS — Z419 Encounter for procedure for purposes other than remedying health state, unspecified: Secondary | ICD-10-CM

## 2020-09-08 DIAGNOSIS — Y841 Kidney dialysis as the cause of abnormal reaction of the patient, or of later complication, without mention of misadventure at the time of the procedure: Secondary | ICD-10-CM | POA: Diagnosis not present

## 2020-09-08 DIAGNOSIS — E119 Type 2 diabetes mellitus without complications: Secondary | ICD-10-CM

## 2020-09-08 DIAGNOSIS — D6859 Other primary thrombophilia: Secondary | ICD-10-CM | POA: Diagnosis not present

## 2020-09-08 DIAGNOSIS — N1831 Chronic kidney disease, stage 3a: Secondary | ICD-10-CM | POA: Diagnosis not present

## 2020-09-08 DIAGNOSIS — D631 Anemia in chronic kidney disease: Secondary | ICD-10-CM | POA: Diagnosis present

## 2020-09-08 DIAGNOSIS — R0602 Shortness of breath: Secondary | ICD-10-CM

## 2020-09-08 DIAGNOSIS — G8918 Other acute postprocedural pain: Secondary | ICD-10-CM | POA: Diagnosis not present

## 2020-09-08 DIAGNOSIS — N189 Chronic kidney disease, unspecified: Secondary | ICD-10-CM | POA: Diagnosis not present

## 2020-09-08 DIAGNOSIS — Z7984 Long term (current) use of oral hypoglycemic drugs: Secondary | ICD-10-CM

## 2020-09-08 DIAGNOSIS — Z9119 Patient's noncompliance with other medical treatment and regimen: Secondary | ICD-10-CM

## 2020-09-08 DIAGNOSIS — E1140 Type 2 diabetes mellitus with diabetic neuropathy, unspecified: Secondary | ICD-10-CM | POA: Diagnosis present

## 2020-09-08 DIAGNOSIS — Z882 Allergy status to sulfonamides status: Secondary | ICD-10-CM

## 2020-09-08 DIAGNOSIS — L03115 Cellulitis of right lower limb: Secondary | ICD-10-CM | POA: Diagnosis present

## 2020-09-08 DIAGNOSIS — Z23 Encounter for immunization: Secondary | ICD-10-CM

## 2020-09-08 DIAGNOSIS — Q6689 Other  specified congenital deformities of feet: Secondary | ICD-10-CM

## 2020-09-08 DIAGNOSIS — Z885 Allergy status to narcotic agent status: Secondary | ICD-10-CM

## 2020-09-08 DIAGNOSIS — E1152 Type 2 diabetes mellitus with diabetic peripheral angiopathy with gangrene: Secondary | ICD-10-CM | POA: Diagnosis present

## 2020-09-08 DIAGNOSIS — I959 Hypotension, unspecified: Secondary | ICD-10-CM | POA: Diagnosis not present

## 2020-09-08 DIAGNOSIS — I130 Hypertensive heart and chronic kidney disease with heart failure and stage 1 through stage 4 chronic kidney disease, or unspecified chronic kidney disease: Secondary | ICD-10-CM | POA: Diagnosis present

## 2020-09-08 DIAGNOSIS — G9341 Metabolic encephalopathy: Secondary | ICD-10-CM | POA: Diagnosis not present

## 2020-09-08 DIAGNOSIS — L0390 Cellulitis, unspecified: Secondary | ICD-10-CM | POA: Diagnosis not present

## 2020-09-08 DIAGNOSIS — I890 Lymphedema, not elsewhere classified: Secondary | ICD-10-CM | POA: Diagnosis present

## 2020-09-08 DIAGNOSIS — N400 Enlarged prostate without lower urinary tract symptoms: Secondary | ICD-10-CM | POA: Diagnosis present

## 2020-09-08 DIAGNOSIS — I96 Gangrene, not elsewhere classified: Secondary | ICD-10-CM | POA: Diagnosis not present

## 2020-09-08 DIAGNOSIS — E876 Hypokalemia: Secondary | ICD-10-CM | POA: Diagnosis present

## 2020-09-08 DIAGNOSIS — L89326 Pressure-induced deep tissue damage of left buttock: Secondary | ICD-10-CM | POA: Diagnosis not present

## 2020-09-08 DIAGNOSIS — A084 Viral intestinal infection, unspecified: Secondary | ICD-10-CM | POA: Diagnosis not present

## 2020-09-08 LAB — CBC WITH DIFFERENTIAL/PLATELET
Abs Immature Granulocytes: 0.15 10*3/uL — ABNORMAL HIGH (ref 0.00–0.07)
Abs Immature Granulocytes: 0.08 10*3/uL — ABNORMAL HIGH (ref 0.00–0.07)
Basophils Absolute: 0 10*3/uL (ref 0.0–0.1)
Basophils Absolute: 0 10*3/uL (ref 0.0–0.1)
Basophils Relative: 0 %
Basophils Relative: 0 %
Eosinophils Absolute: 0 10*3/uL (ref 0.0–0.5)
Eosinophils Absolute: 0 10*3/uL (ref 0.0–0.5)
Eosinophils Relative: 0 %
Eosinophils Relative: 0 %
HCT: 41.7 % (ref 39.0–52.0)
HCT: 38.7 % — ABNORMAL LOW (ref 39.0–52.0)
Hemoglobin: 13.1 g/dL (ref 13.0–17.0)
Hemoglobin: 12.4 g/dL — ABNORMAL LOW (ref 13.0–17.0)
Immature Granulocytes: 1 %
Immature Granulocytes: 1 %
Lymphocytes Relative: 4 %
Lymphocytes Relative: 6 %
Lymphs Abs: 0.8 10*3/uL (ref 0.7–4.0)
Lymphs Abs: 1 10*3/uL (ref 0.7–4.0)
MCH: 26.8 pg (ref 26.0–34.0)
MCH: 27.3 pg (ref 26.0–34.0)
MCHC: 31.4 g/dL (ref 30.0–36.0)
MCHC: 32 g/dL (ref 30.0–36.0)
MCV: 85.3 fL (ref 80.0–100.0)
MCV: 85.2 fL (ref 80.0–100.0)
Monocytes Absolute: 1.5 10*3/uL — ABNORMAL HIGH (ref 0.1–1.0)
Monocytes Absolute: 1.5 10*3/uL — ABNORMAL HIGH (ref 0.1–1.0)
Monocytes Relative: 8 %
Monocytes Relative: 8 %
Neutro Abs: 17.7 10*3/uL — ABNORMAL HIGH (ref 1.7–7.7)
Neutro Abs: 15.2 10*3/uL — ABNORMAL HIGH (ref 1.7–7.7)
Neutrophils Relative %: 87 %
Neutrophils Relative %: 85 %
Platelets: 299 10*3/uL (ref 150–400)
Platelets: 280 10*3/uL (ref 150–400)
RBC: 4.89 MIL/uL (ref 4.22–5.81)
RBC: 4.54 MIL/uL (ref 4.22–5.81)
RDW: 14.1 % (ref 11.5–15.5)
RDW: 14.3 % (ref 11.5–15.5)
WBC: 20.1 10*3/uL — ABNORMAL HIGH (ref 4.0–10.5)
WBC: 17.7 10*3/uL — ABNORMAL HIGH (ref 4.0–10.5)
nRBC: 0 % (ref 0.0–0.2)
nRBC: 0 % (ref 0.0–0.2)

## 2020-09-08 LAB — C-REACTIVE PROTEIN: CRP: 8.2 mg/dL — ABNORMAL HIGH (ref <1.0)

## 2020-09-08 LAB — COMPREHENSIVE METABOLIC PANEL
ALT: 18 U/L (ref 0–44)
AST: 23 U/L (ref 15–41)
Albumin: 2.8 g/dL — ABNORMAL LOW (ref 3.5–5.0)
Alkaline Phosphatase: 86 U/L (ref 38–126)
Anion gap: 14 (ref 5–15)
BUN: 37 mg/dL — ABNORMAL HIGH (ref 8–23)
CO2: 22 mmol/L (ref 22–32)
Calcium: 8 mg/dL — ABNORMAL LOW (ref 8.9–10.3)
Chloride: 96 mmol/L — ABNORMAL LOW (ref 98–111)
Creatinine, Ser: 1.98 mg/dL — ABNORMAL HIGH (ref 0.61–1.24)
GFR, Estimated: 35 mL/min — ABNORMAL LOW (ref >60)
Glucose, Bld: 260 mg/dL — ABNORMAL HIGH (ref 70–99)
Potassium: 3.5 mmol/L (ref 3.5–5.1)
Sodium: 132 mmol/L — ABNORMAL LOW (ref 135–145)
Total Bilirubin: 1.2 mg/dL (ref 0.3–1.2)
Total Protein: 7.9 g/dL (ref 6.5–8.1)

## 2020-09-08 LAB — BASIC METABOLIC PANEL
Anion gap: 12 (ref 5–15)
BUN: 40 mg/dL — ABNORMAL HIGH (ref 8–23)
CO2: 23 mmol/L (ref 22–32)
Calcium: 7.7 mg/dL — ABNORMAL LOW (ref 8.9–10.3)
Chloride: 99 mmol/L (ref 98–111)
Creatinine, Ser: 1.96 mg/dL — ABNORMAL HIGH (ref 0.61–1.24)
GFR, Estimated: 36 mL/min — ABNORMAL LOW (ref >60)
Glucose, Bld: 205 mg/dL — ABNORMAL HIGH (ref 70–99)
Potassium: 3.8 mmol/L (ref 3.5–5.1)
Sodium: 134 mmol/L — ABNORMAL LOW (ref 135–145)

## 2020-09-08 LAB — APTT: aPTT: 44 seconds — ABNORMAL HIGH (ref 24–36)

## 2020-09-08 LAB — RESPIRATORY PANEL BY RT PCR (FLU A&B, COVID)
Influenza A by PCR: NEGATIVE
Influenza B by PCR: NEGATIVE
SARS Coronavirus 2 by RT PCR: NEGATIVE

## 2020-09-08 LAB — PREALBUMIN: Prealbumin: 11.1 mg/dL — ABNORMAL LOW (ref 18–38)

## 2020-09-08 LAB — LACTIC ACID, PLASMA
Lactic Acid, Venous: 2.7 mmol/L (ref 0.5–1.9)
Lactic Acid, Venous: 1.8 mmol/L (ref 0.5–1.9)

## 2020-09-08 LAB — SEDIMENTATION RATE: Sed Rate: 55 mm/hr — ABNORMAL HIGH (ref 0–16)

## 2020-09-08 LAB — HEPARIN LEVEL (UNFRACTIONATED): Heparin Unfractionated: <0.1 IU/mL — ABNORMAL LOW (ref 0.30–0.70)

## 2020-09-08 LAB — GLUCOSE, CAPILLARY: Glucose-Capillary: 220 mg/dL — ABNORMAL HIGH (ref 70–99)

## 2020-09-08 MED ORDER — HEPARIN (PORCINE) 25000 UT/250ML-% IV SOLN
1500.0000 [IU]/h | INTRAVENOUS | Status: DC
  Administered 2020-09-08: 1500 [IU]/h via INTRAVENOUS
  Filled 2020-09-08: qty 250

## 2020-09-08 MED ORDER — INSULIN ASPART 100 UNIT/ML ~~LOC~~ SOLN
0.0000 [IU] | Freq: Every day | SUBCUTANEOUS | Status: DC
  Administered 2020-09-08 – 2020-09-13 (×3): 2 [IU] via SUBCUTANEOUS
  Filled 2020-09-08: qty 0.05

## 2020-09-08 MED ORDER — SODIUM CHLORIDE 0.9 % IV BOLUS
1000.0000 mL | Freq: Once | INTRAVENOUS | Status: AC
  Administered 2020-09-08: 1000 mL via INTRAVENOUS

## 2020-09-08 MED ORDER — HEPARIN BOLUS VIA INFUSION
5000.0000 [IU] | Freq: Once | INTRAVENOUS | Status: AC
  Administered 2020-09-08: 5000 [IU] via INTRAVENOUS
  Filled 2020-09-08: qty 5000

## 2020-09-08 MED ORDER — SODIUM CHLORIDE 0.9 % IV SOLN
2.0000 g | Freq: Once | INTRAVENOUS | Status: AC
  Administered 2020-09-08: 2 g via INTRAVENOUS
  Filled 2020-09-08: qty 2

## 2020-09-08 MED ORDER — METOPROLOL TARTRATE 25 MG PO TABS
25.0000 mg | ORAL_TABLET | Freq: Two times a day (BID) | ORAL | Status: DC
  Administered 2020-09-08 – 2020-09-09 (×2): 25 mg via ORAL
  Filled 2020-09-08 (×2): qty 1

## 2020-09-08 MED ORDER — VANCOMYCIN HCL IN DEXTROSE 1-5 GM/200ML-% IV SOLN: 1000.0000 mg | Freq: Once | INTRAVENOUS | Status: DC

## 2020-09-08 MED ORDER — INSULIN ASPART 100 UNIT/ML ~~LOC~~ SOLN
0.0000 [IU] | Freq: Three times a day (TID) | SUBCUTANEOUS | Status: DC
  Administered 2020-09-09 – 2020-09-10 (×4): 3 [IU] via SUBCUTANEOUS
  Administered 2020-09-10: 1 [IU] via SUBCUTANEOUS
  Administered 2020-09-10 – 2020-09-11 (×3): 3 [IU] via SUBCUTANEOUS
  Administered 2020-09-11: 2 [IU] via SUBCUTANEOUS
  Administered 2020-09-12: 3 [IU] via SUBCUTANEOUS
  Administered 2020-09-12: 2 [IU] via SUBCUTANEOUS
  Administered 2020-09-12 – 2020-09-13 (×2): 3 [IU] via SUBCUTANEOUS
  Administered 2020-09-13: 2 [IU] via SUBCUTANEOUS
  Administered 2020-09-13 – 2020-09-14 (×2): 3 [IU] via SUBCUTANEOUS
  Administered 2020-09-14: 2 [IU] via SUBCUTANEOUS
  Administered 2020-09-14: 3 [IU] via SUBCUTANEOUS
  Administered 2020-09-15: 0 [IU] via SUBCUTANEOUS
  Administered 2020-09-15 (×2): 2 [IU] via SUBCUTANEOUS
  Administered 2020-09-16: 3 [IU] via SUBCUTANEOUS
  Administered 2020-09-16: 0 [IU] via SUBCUTANEOUS
  Administered 2020-09-16 – 2020-09-17 (×2): 3 [IU] via SUBCUTANEOUS
  Administered 2020-09-17 – 2020-09-18 (×4): 2 [IU] via SUBCUTANEOUS
  Administered 2020-09-19: 3 [IU] via SUBCUTANEOUS
  Administered 2020-09-20 – 2020-09-25 (×4): 2 [IU] via SUBCUTANEOUS
  Filled 2020-09-08: qty 0.15

## 2020-09-08 MED ORDER — VANCOMYCIN HCL 1500 MG/300ML IV SOLN
1500.0000 mg | INTRAVENOUS | Status: DC
  Administered 2020-09-09: 1500 mg via INTRAVENOUS
  Filled 2020-09-08: qty 300

## 2020-09-08 MED ORDER — VANCOMYCIN HCL 2000 MG/400ML IV SOLN
2000.0000 mg | Freq: Once | INTRAVENOUS | Status: AC
  Administered 2020-09-08: 2000 mg via INTRAVENOUS
  Filled 2020-09-08: qty 400

## 2020-09-08 MED ORDER — PIPERACILLIN-TAZOBACTAM 3.375 G IVPB 30 MIN
3.3750 g | Freq: Once | INTRAVENOUS | Status: AC
  Administered 2020-09-08: 3.375 g via INTRAVENOUS
  Filled 2020-09-08: qty 50

## 2020-09-08 MED ORDER — HYDROMORPHONE HCL 1 MG/ML IJ SOLN
0.5000 mg | INTRAMUSCULAR | Status: DC | PRN
  Administered 2020-09-09 – 2020-09-10 (×2): 1 mg via INTRAVENOUS
  Administered 2020-09-13: 0.5 mg via INTRAVENOUS
  Administered 2020-09-13 – 2020-09-29 (×18): 1 mg via INTRAVENOUS
  Filled 2020-09-08 (×20): qty 1

## 2020-09-08 MED ORDER — ACETAMINOPHEN 325 MG PO TABS
650.0000 mg | ORAL_TABLET | Freq: Four times a day (QID) | ORAL | Status: DC | PRN
  Administered 2020-09-09 – 2020-09-30 (×13): 650 mg via ORAL
  Filled 2020-09-08 (×14): qty 2

## 2020-09-08 MED ORDER — LABETALOL HCL 5 MG/ML IV SOLN
5.0000 mg | INTRAVENOUS | Status: DC | PRN
  Administered 2020-09-09 – 2020-09-24 (×2): 5 mg via INTRAVENOUS
  Filled 2020-09-08: qty 4

## 2020-09-08 MED ORDER — ACETAMINOPHEN 650 MG RE SUPP
650.0000 mg | Freq: Four times a day (QID) | RECTAL | Status: DC | PRN
  Filled 2020-09-08: qty 1

## 2020-09-08 MED ORDER — SODIUM CHLORIDE 0.9 % IV SOLN
2.0000 g | Freq: Two times a day (BID) | INTRAVENOUS | Status: DC
  Administered 2020-09-09 – 2020-09-11 (×6): 2 g via INTRAVENOUS
  Filled 2020-09-08 (×7): qty 2

## 2020-09-08 MED ORDER — SODIUM CHLORIDE 0.9 % IV SOLN: INTRAVENOUS | Status: DC

## 2020-09-08 NOTE — Progress Notes (Signed)
Notified bedside nurse of need to draw blood cultures.  

## 2020-09-08 NOTE — H&P (Signed)
History and Physical    [name]. ZSW:[id number] DOB: 1951/07/19 DOA: 09/08/2020  PCP: Aura Dials, MD  Patient coming from: Home  Chief Complaint: R foot wound  HPI: [name]. is a 69 y.o. male with medical history significant of DM2, diastolic chf, hx afib on eliquis, HTN, presents to ED after being found down in the bathroom this AM during welfare check. Per report, pt noted to have an unsanitary living condition and is currently followed by adult protective services. Patient reports falling while standing up from bathroom this AM without LOC, resulting in fall onto R chest and resultant R lower chest pain. Patient was found down and brought into ED.   Of note, patient reports stopping all medications several months ago with exception of torsemide because "I didn't think I needed them anymore." Pt has only been taking torsemide PRN swelling. Denies sob or abd pain. Does complain of R lower chest pain s/p fall on the AM of presentation  ED Course: In the ED, pt found to be tachycardic with HR in the 130's, RR as high as 28 breaths. Temp of 98.23F. WBC noted to be elevated at 20.1k, Cr elevated at 1.98. Lactate was elevated at 2.7. Patient was given empiric broad spectrum abx and IVF hydration. Orthopedic Surgery was consulted who recommended medical admit. Hospitaliist consulted for consideration for admission  COVID testing pending at this time  Review of Systems:  Review of Systems  Constitutional: Negative for chills and fever.  HENT: Negative for congestion, ear discharge and ear pain.   Eyes: Negative for double vision and photophobia.  Respiratory: Negative for hemoptysis, sputum production and shortness of breath.   Cardiovascular: Negative for palpitations, orthopnea and claudication.  Gastrointestinal: Negative for abdominal pain, nausea and vomiting.  Genitourinary: Negative for frequency, hematuria and urgency.  Musculoskeletal: Positive for falls.  Negative for back pain, joint pain and neck pain.  Neurological: Negative for tingling, tremors, seizures and loss of consciousness.  Psychiatric/Behavioral: Negative for substance abuse and suicidal ideas. The patient does not have insomnia.     Past Medical History:  Diagnosis Date  . Arthritis   . Back pain    occasionally  . BPH (benign prostatic hypertrophy)    takes Uroxatral daily as well as Finasteride   . Diabetes mellitus without complication ([place])    takes Metformin daily  . Diabetic foot infection ([place]) 10/22/2019  . GERD (gastroesophageal reflux disease)    no meds  . Hepatitis hx of   at age 77  . Hypertension    takes Losartan and HCTZ daily  . Joint pain   . Lymphedema 10/22/2019  . Urinary frequency   . Urinary urgency   . Venous stasis dermatitis 11/19/2019    Past Surgical History:  Procedure Laterality Date  . APPENDECTOMY     age 40  . COLONOSCOPY    . KNEE ARTHROSCOPY  1984   right and left   . SHOULDER ARTHROSCOPY  2003   right RCR-GSC-stayed overnight-had SOB  . SHOULDER ARTHROSCOPY WITH ROTATOR CUFF REPAIR AND SUBACROMIAL DECOMPRESSION Left 03/13/2013   Procedure: LEFT SHOULDER ARTHROSCOPY WITH SUBACROMIAL DECOMPRESSION, DISTAL CLAVICLE RESECTION AND REPAIR ROTATOR CUFF AND LABRAL DEBRIDEMENT;  Surgeon: [name]., MD;  Location: [facility];  Service: Orthopedics;  Laterality: Left;  . TOTAL HIP ARTHROPLASTY Right 01/29/2015   Procedure: TOTAL HIP ARTHROPLASTY ANTERIOR APPROACH;  Surgeon: [name], MD;  Location: [facility];  Service: Orthopedics;  Laterality: Right;  .  VASECTOMY    . [place]     reports that he has never smoked. He quit smokeless tobacco use about 56 years ago.  His smokeless tobacco use included chew. He reports current alcohol use. He reports that he does not use drugs.  Allergies  Allergen Reactions  . Codeine Swelling    Water retention  . Sulfur Other (See Comments)    Not sure of  reaction a young child    Family History  Problem Relation Age of Onset  . Benign prostatic hyperplasia Father   . Kidney cancer Neg Hx   . Kidney disease Neg Hx     Prior to Admission medications   Medication Sig Start Date End Date Taking? Authorizing Provider  apixaban (ELIQUIS) 5 MG TABS tablet Take 1 tablet (5 mg total) by mouth 2 (two) times daily. 02/06/20 03/07/20  Marianna Payment, MD  Cholecalciferol (VITAMIN D3) 5000 UNITS TABS Take 1 tablet by mouth daily. Reported on 04/29/2016    [provider]  finasteride (PROSCAR) 5 MG tablet Take 5 mg by mouth daily.    [provider]  fluticasone (FLONASE) 50 MCG/ACT nasal spray Place 2 sprays into both nostrils daily as needed for allergies or rhinitis.    [provider]  GABAPENTIN PO Take 800 mg by mouth daily as needed (pain).     [provider]  glipiZIDE (GLUCOTROL XL) 5 MG 24 hr tablet Take 10 mg by mouth daily with breakfast.  01/30/19   [provider]  metoprolol tartrate (LOPRESSOR) 50 MG tablet Take 1 tablet (50 mg total) by mouth 2 (two) times daily for 30 doses. 02/06/20 02/21/20  Marianna Payment, MD  potassium chloride SA (KLOR-CON) 20 MEQ tablet Take 1 tablet by mouth daily 09/18/19   Richardson Dopp T, PA-C  torsemide (DEMADEX) 20 MG tablet Take 2 tablets (40 mg total) by mouth 2 (two) times daily. 02/06/20 03/07/20  Marianna Payment, MD    Physical Exam: Vitals:   09/08/20 1438 09/08/20 1441 09/08/20 1600  BP:  125/89 112/84  Pulse:  69   Resp:  (!) 23 (!) 28  Temp:  98.1 F (36.7 C)   TempSrc:  Oral   SpO2:  95%   Weight: 124.7 kg    Height: 5\' 9"  (1.753 m)      Constitutional: NAD, calm, comfortable Vitals:   09/08/20 1438 09/08/20 1441 09/08/20 1600  BP:  125/89 112/84  Pulse:  69   Resp:  (!) 23 (!) 28  Temp:  98.1 F (36.7 C)   TempSrc:  Oral   SpO2:  95%   Weight: 124.7 kg    Height: 5\' 9"  (1.753 m)     Eyes: PERRL, lids and conjunctivae normal ENMT: Mucous  membranes are moist. Posterior pharynx clear of any exudate or lesions.Normal dentition.   Neck: normal, supple, no masses, trachea midline Respiratory: clear to auscultation bilaterally, no wheezing, no crackles. Normal respiratory effort. No accessory muscle use.  Cardiovascular: tachycardic, s1, s2. Irregularly irregularly Abdomen: no tenderness, no masses palpated. No hepatosplenomegaly. Bowel sounds positive.  Musculoskeletal: no clubbing / cyanosis. No joint deformity upper and lower extremities. Good ROM, no contractures. Normal muscle tone.  Skin: non-drainnig ulcerations at bottom of R foot Neurologic: CN 2-12 grossly intact. Sensation intact, strength intact Psychiatric: Normal judgment and insight. Alert and oriented x 3. Normal mood.    Labs on Admission: I have personally reviewed following labs and imaging studies  CBC: Recent Labs  Lab 09/08/20 1446  WBC 20.1*  NEUTROABS 17.7*  HGB 13.1  HCT 41.7  MCV 85.3  PLT 993   Basic Metabolic Panel: Recent Labs  Lab 09/08/20 1446  NA 132*  K 3.5  CL 96*  CO2 22  GLUCOSE 260*  BUN 37*  CREATININE 1.98*  CALCIUM 8.0*   GFR: Estimated Creatinine Clearance: 44.7 mL/min (A) (by C-G formula based on SCr of 1.98 mg/dL (H)). Liver Function Tests: Recent Labs  Lab 09/08/20 1446  AST 23  ALT 18  ALKPHOS 86  BILITOT 1.2  PROT 7.9  ALBUMIN 2.8*   No results for input(s): LIPASE, AMYLASE in the last 168 hours. No results for input(s): AMMONIA in the last 168 hours. Coagulation Profile: No results for input(s): INR, PROTIME in the last 168 hours. Cardiac Enzymes: No results for input(s): CKTOTAL, CKMB, CKMBINDEX, TROPONINI in the last 168 hours. BNP (last 3 results) No results for input(s): PROBNP in the last 8760 hours. HbA1C: No results for input(s): HGBA1C in the last 72 hours. CBG: No results for input(s): GLUCAP in the last 168 hours. Lipid Profile: No results for input(s): CHOL, HDL, LDLCALC, TRIG, CHOLHDL,  LDLDIRECT in the last 72 hours. Thyroid Function Tests: No results for input(s): TSH, T4TOTAL, FREET4, T3FREE, THYROIDAB in the last 72 hours. Anemia Panel: No results for input(s): VITAMINB12, FOLATE, FERRITIN, TIBC, IRON, RETICCTPCT in the last 72 hours. Urine analysis:    Component Value Date/Time   COLORURINE YELLOW 01/17/2015 1038   APPEARANCEUR CLEAR 01/17/2015 1038   LABSPEC 1.024 01/17/2015 1038   PHURINE 5.0 01/17/2015 1038   GLUCOSEU NEGATIVE 01/17/2015 1038   HGBUR NEGATIVE 01/17/2015 1038   BILIRUBINUR NEGATIVE 01/17/2015 1038   KETONESUR NEGATIVE 01/17/2015 1038   PROTEINUR NEGATIVE 01/17/2015 1038   UROBILINOGEN 0.2 01/17/2015 1038   NITRITE NEGATIVE 01/17/2015 1038   LEUKOCYTESUR NEGATIVE 01/17/2015 1038   Sepsis Labs: !!!!!!!!!!!!!!!!!!!!!!!!!!!!!!!!!!!!!!!!!!!! @LABRCNTIP (procalcitonin:4,lacticidven:4) )No results found for this or any previous visit (from the past 240 hour(s)).   Radiological Exams on Admission: DG Foot Complete Right  Result Date: 09/08/2020 CLINICAL DATA:  Bone deep wound on right foot, extensive gangrene in both legs EXAM: RIGHT FOOT COMPLETE - 3+ VIEW COMPARISON:  None. FINDINGS: Extensive soft tissue swelling/thickening of the lower extremity. Reported site of ulceration is difficult to visualize radiographically. Suspect some ulceration along the plantar aspect of the calcaneus and involving the digits. Extensive radiodense foreign bodies projecting over digits appear external to the soft tissues. Some additional mineralization is noted along the plantar surface as well. There is a likely fracture dislocation of the fifth proximal interphalangeal joint with dorsal-lateral subluxation. Additional clawtoe deformities of the second through fourth rays. Marked subcortical lucency of the head of the fifth and to a lesser extent fourth metatarsals raise suspicion for potential osteomyelitis. Correlate for sites of ulceration and clinical findings.  Atherosclerotic calcification of the soft tissues. IMPRESSION: 1. Extensive soft tissue swelling/thickening of the lower extremity. 2. Suspect ulceration along the plantar aspect of the calcaneus and involving the digits/head of the metatarsals. 3. Subcortical lucency particularly involving the head of the fifth and to a lesser extent fourth metatarsals raises suspicion for possible osteomyelitis. 4. Likely fracture dislocation of the fifth proximal interphalangeal joint with dorsal-lateral subluxation. 5. Extensive radiodense foreign bodies projecting along the plantar skin surface and about the digits, correlate with visual inspection. Electronically Signed   By: Lovena Le M.D.   On: 09/08/2020 16:26    EKG: Independently reviewed. Prior EKG from 3/21  reviewed. Findings notable for afib. Repeat EKG pending  Assessment/Plan Principal Problem:   Pyogenic inflammation of bone (HCC) Active Problems:   Type 2 diabetes mellitus (HCC)   Essential hypertension   Acute on chronic heart failure with preserved ejection fraction (HFpEF) ([place])   Diabetic foot infection ([place])   Atrial fibrillation ([place])   Sepsis ([place])   ARF (acute renal failure) ([place])   1. Necrotic diabetic foot infection, possible osteomyelitis with sepsis present on admit with lactic acidosis 1. Pt followed by wound clinic.  2. Chart reviewed, pt had prior concerns of vasculopathy prior to admit, remote outpatient ABI noted to be unremarkable 3. Orthopedic Surgery consulted by ED who will consult Dr. Sharol Given 4. Will order repeat ABI 5. Vanc and cefepime started in ED, will continue 6. Follow culture results 2. DM2  1. Will check a1c. Most recent from 3/21 noted to be 7.3 2. Will hold oral hyperglycemic meds while in hospital, continue SSI coverage for now 3. HTN 1. BP currently stable 2. Pt has been noncompliant with home meds 3. Would cont on metoprolol for now per below 4. Chronic diastolic CHF 1. Appears euvolemic at this  time 2. Given presenting sepsis, would hold diuretics 3. Would resume home torsemide as pt's hemodynamics improve 5. Afib with RVR 1. Tachycardic in ED, likely secondary to presenting sepsis 2. Per EDP, HR is fluid responsive and had since improved following IVF hydration 3. When seen, pt remains in afib RVR 4. Pt has not been taking metoprolol x several months 5. Will start metoprolol 25mg  BID with PRN labetalol IV for HR>120 6. Supposed to be on eliquis prior to admit, however pt has been noncompliant and not taken eliquis x several months 7. Will continue on heparin gtt 6. ARF 1. Presenting Cr of 1.9 2. Continue IVF hydration 3. Repeat bmet in AM 7. Acquired thrombophilia  1. Per above, hold eliquis while in hospital until OK to resume per Orthopedic Surgery service. Last dose was several months ago 2. Continue on heparin gtt 8. Medical noncompliance  DVT prophylaxis: Heparin gtt  Code Status: Full, confirmed with patient Family Communication: Pt in room  Disposition Plan: Uncertain at this time  Consults called: Orthopedic Surgery Admission status: Inpatient as pt presents with sepsis related to diabetic foot infection and possible osteomyelitis may result in increased morbility and mortality. Will therefore likely require greater than 2 midnight stay for iv abx and surgical consultation   Marylu Lund MD [facility] Pager On Amion  If 7PM-7AM, please contact night-coverage  09/08/2020, 5:08 PM

## 2020-09-08 NOTE — ED Notes (Signed)
Nurse attempted to gather urine sample from pt, pt said he is unable to urinate at this time.

## 2020-09-08 NOTE — ED Provider Notes (Signed)
[facility] DEPT Provider Note   CSN: [id number] Arrival date & time: 09/08/20  1425     History Chief Complaint  Patient presents with  . Wound Infection    [name]. is a 69 y.o. male with past medical history of type 2 diabetes, venous insufficiencies, lymphedema who presented to the ED via EMS due to right foot wound.  Patient denies any pain, fever, chills, chest pain, shortness of breath, abdominal pain, urinary symptoms.  He is not sure if his legs are more swollen.  He also did not check his feet.  Patient lives at home with his wife who is hospitalized since 2 weeks ago.  Patient takes poor care of himself and is not taking any medications.  Patient states that he has not taking a shower for a few weeks.  He states that he still ambulating around the house.  Her report a episode of fall this morning in the bathroom and denies head injuries or loss of consciousness.  He pulled himself up.  Patient was seen recently at the wound care center for left foot wound.  HPI     Past Medical History:  Diagnosis Date  . Arthritis   . Back pain    occasionally  . BPH (benign prostatic hypertrophy)    takes Uroxatral daily as well as Finasteride   . Diabetes mellitus without complication ([place])    takes Metformin daily  . Diabetic foot infection ([place]) 10/22/2019  . GERD (gastroesophageal reflux disease)    no meds  . Hepatitis hx of   at age 48  . Hypertension    takes Losartan and HCTZ daily  . Joint pain   . Lymphedema 10/22/2019  . Urinary frequency   . Urinary urgency   . Venous stasis dermatitis 11/19/2019    Patient Active Problem List   Diagnosis Date Noted  . Sepsis ([place]) 09/08/2020  . Pyogenic inflammation of bone ([place]) 09/08/2020  . Acute exacerbation of CHF (congestive heart failure) ([place]) 02/02/2020  . Atrial fibrillation ([place]) 01/31/2020  . Venous stasis dermatitis 11/19/2019  . Diabetic foot infection ([place]) 10/22/2019  .  Lymphedema 10/22/2019  . Acute on chronic heart failure with preserved ejection fraction (HFpEF) ([place]) 06/20/2018  . Benign prostatic hyperplasia with urinary obstruction 01/30/2016  . Primary localized osteoarthrosis of pelvic region 01/29/2015  . Benign fibroma of prostate 06/17/2014  . Bladder neck obstruction 12/17/2013  . Elevated prostate specific antigen (PSA) 12/17/2013  . ED (erectile dysfunction) of organic origin 12/17/2013  . Decreased libido 12/17/2013  . Type 2 diabetes mellitus ([place]) 03/19/2013  . Essential hypertension 03/19/2013    Past Surgical History:  Procedure Laterality Date  . APPENDECTOMY     age 85  . COLONOSCOPY    . KNEE ARTHROSCOPY  1984   right and left   . SHOULDER ARTHROSCOPY  2003   right RCR-GSC-stayed overnight-had SOB  . SHOULDER ARTHROSCOPY WITH ROTATOR CUFF REPAIR AND SUBACROMIAL DECOMPRESSION Left 03/13/2013   Procedure: LEFT SHOULDER ARTHROSCOPY WITH SUBACROMIAL DECOMPRESSION, DISTAL CLAVICLE RESECTION AND REPAIR ROTATOR CUFF AND LABRAL DEBRIDEMENT;  Surgeon: [name]., MD;  Location: [facility];  Service: Orthopedics;  Laterality: Left;  . TOTAL HIP ARTHROPLASTY Right 01/29/2015   Procedure: TOTAL HIP ARTHROPLASTY ANTERIOR APPROACH;  Surgeon: [name], MD;  Location: [facility];  Service: Orthopedics;  Laterality: Right;  Marland Kitchen VASECTOMY    . [place]       Family History  Problem Relation Age of Onset  . Benign prostatic hyperplasia Father   . Kidney cancer Neg Hx   . Kidney disease Neg Hx     Social History   Tobacco Use  . Smoking status: Never Smoker  . Smokeless tobacco: Former Systems developer    Types: Secondary school teacher  . Vaping Use: Never used  Substance Use Topics  . Alcohol use: Yes    Alcohol/week: 0.0 standard drinks    Comment: occasionally beer or scotch  . Drug use: No    Home Medications Prior to Admission medications   Medication Sig Start Date End Date Taking? Authorizing Provider   apixaban (ELIQUIS) 5 MG TABS tablet Take 1 tablet (5 mg total) by mouth 2 (two) times daily. 02/06/20 03/07/20  Marianna Payment, MD  Cholecalciferol (VITAMIN D3) 5000 UNITS TABS Take 1 tablet by mouth daily. Reported on 04/29/2016    [provider]  finasteride (PROSCAR) 5 MG tablet Take 5 mg by mouth daily.    [provider]  fluticasone (FLONASE) 50 MCG/ACT nasal spray Place 2 sprays into both nostrils daily as needed for allergies or rhinitis.    [provider]  GABAPENTIN PO Take 800 mg by mouth daily as needed (pain).     [provider]  glipiZIDE (GLUCOTROL XL) 5 MG 24 hr tablet Take 10 mg by mouth daily with breakfast.  01/30/19   [provider]  metoprolol tartrate (LOPRESSOR) 50 MG tablet Take 1 tablet (50 mg total) by mouth 2 (two) times daily for 30 doses. 02/06/20 02/21/20  Marianna Payment, MD  potassium chloride SA (KLOR-CON) 20 MEQ tablet Take 1 tablet by mouth daily 09/18/19   Richardson Dopp T, PA-C  torsemide (DEMADEX) 20 MG tablet Take 2 tablets (40 mg total) by mouth 2 (two) times daily. 02/06/20 03/07/20  Marianna Payment, MD    Allergies    Codeine and Sulfur  Review of Systems   Review of Systems  Constitutional: Negative for appetite change, chills and fever.  Respiratory: Negative for shortness of breath.   Cardiovascular: Positive for leg swelling. Negative for chest pain.  Gastrointestinal: Negative for abdominal pain, constipation and diarrhea.  Genitourinary: Negative for difficulty urinating and dysuria.    Physical Exam     Updated Vital Signs BP 112/84   Pulse 69   Temp 98.1 F (36.7 C) (Oral)   Resp (!) 28   Ht 5\' 9"  (1.753 m)   Wt 124.7 kg   SpO2 95%   BMI 40.61 kg/m   Physical Exam Constitutional:      General: He is not in acute distress.    Appearance: He is obese.  HENT:     Head: Normocephalic.  Eyes:     General:        Right eye: No discharge.        Left eye: No discharge.  Cardiovascular:      Rate and Rhythm: Tachycardia present.     Heart sounds: Normal heart sounds.  Pulmonary:     Effort: No respiratory distress.     Breath sounds: Normal breath sounds. No wheezing.  Abdominal:     General: Bowel sounds are normal.     Palpations: Abdomen is soft.     Tenderness: There is no abdominal tenderness.  Musculoskeletal:     Cervical back: Normal range of motion.     Right lower leg: Edema present.     Left lower leg: Edema present.     Comments:  Severe lower extremity edema with hardened skin.  Skin:    Comments: See picture for foot wound  Neurological:     Mental Status: He is alert.  Psychiatric:        Mood and Affect: Mood normal.     ED Results / Procedures / Treatments   Labs (all labs ordered are listed, but only abnormal results are displayed) Labs Reviewed  COMPREHENSIVE METABOLIC PANEL - Abnormal; Notable for the following components:      Result Value   Sodium 132 (*)    Chloride 96 (*)    Glucose, Bld 260 (*)    BUN 37 (*)    Creatinine, Ser 1.98 (*)    Calcium 8.0 (*)    Albumin 2.8 (*)    GFR, Estimated 35 (*)    All other components within normal limits  CBC WITH DIFFERENTIAL/PLATELET - Abnormal; Notable for the following components:   WBC 20.1 (*)    Neutro Abs 17.7 (*)    Monocytes Absolute 1.5 (*)    Abs Immature Granulocytes 0.15 (*)    All other components within normal limits  RESPIRATORY PANEL BY RT PCR (FLU A&B, COVID)  LACTIC ACID, PLASMA  LACTIC ACID, PLASMA  URINALYSIS, ROUTINE W REFLEX MICROSCOPIC    EKG None  Radiology DG Foot Complete Right  Result Date: 09/08/2020 CLINICAL DATA:  Bone deep wound on right foot, extensive gangrene in both legs EXAM: RIGHT FOOT COMPLETE - 3+ VIEW COMPARISON:  None. FINDINGS: Extensive soft tissue swelling/thickening of the lower extremity. Reported site of ulceration is difficult to visualize radiographically. Suspect some ulceration along the plantar aspect of the calcaneus and involving  the digits. Extensive radiodense foreign bodies projecting over digits appear external to the soft tissues. Some additional mineralization is noted along the plantar surface as well. There is a likely fracture dislocation of the fifth proximal interphalangeal joint with dorsal-lateral subluxation. Additional clawtoe deformities of the second through fourth rays. Marked subcortical lucency of the head of the fifth and to a lesser extent fourth metatarsals raise suspicion for potential osteomyelitis. Correlate for sites of ulceration and clinical findings. Atherosclerotic calcification of the soft tissues. IMPRESSION: 1. Extensive soft tissue swelling/thickening of the lower extremity. 2. Suspect ulceration along the plantar aspect of the calcaneus and involving the digits/head of the metatarsals. 3. Subcortical lucency particularly involving the head of the fifth and to a lesser extent fourth metatarsals raises suspicion for possible osteomyelitis. 4. Likely fracture dislocation of the fifth proximal interphalangeal joint with dorsal-lateral subluxation. 5. Extensive radiodense foreign bodies projecting along the plantar skin surface and about the digits, correlate with visual inspection. Electronically Signed   By: Lovena Le M.D.   On: 09/08/2020 16:26    Procedures Procedures (including critical care time)  Medications Ordered in ED Medications  vancomycin (VANCOREADY) IVPB 2000 mg/400 mL (2,000 mg Intravenous New Bag/Given 09/08/20 1642)  sodium chloride 0.9 % bolus 1,000 mL (1,000 mLs Intravenous New Bag/Given 09/08/20 1546)  piperacillin-tazobactam (ZOSYN) IVPB 3.375 g (0 g Intravenous Stopped 09/08/20 1645)    ED Course  I have reviewed the triage vital signs and the nursing notes.  Pertinent labs & imaging results that were available during my care of the patient were reviewed by me and considered in my medical decision making (see chart for details).    MDM Rules/Calculators/A&P  Patient presents to the ED for right foot wound.  He is a diabetic patient but is noncompliant with medications.  He also takes really poor care of himself and does not pay attention to his feet.  He has severe edema of bilateral lower extremities with hardened skin changes.  There is a circular wound in the upper lateral sole and a large wound of the right heel.  Patient denies fever, chills, chest pain, shortness of breath, abdominal pain or urinary symptoms.  X-ray of his right foot reveals bony involvement of the fifth toe suspicious for osteomyelitis.  CBC shows leukocytosis of 20.1.  CMP shows mild hyponatremia 132, hypochloremia 96 and AKI with creatinine 1.98.  Antibiotics with vancomycin and Zosyn were started empirically.  Patient will be admitted to the hospital for possible surgical intervention of his right foot wound.  He may need vascular study prior to the operation.  Signed out to Dr. Wyline Copas, the hospital medicine physician.  Consulted Dr. Jerene Pitch, orthopedic surgeon and he will see the patient.    Final Clinical Impression(s) / ED Diagnoses Final diagnoses:  Other acute osteomyelitis of right foot [facility])    Rx / DC Orders ED Discharge Orders    None       Gaylan Gerold, DO 09/08/20 [place], [place], DO 09/09/20 0745

## 2020-09-08 NOTE — ED Triage Notes (Signed)
Pt arrived via EMS from home. Welfare check from family members. EMS had to convince pt to go to ER. Patient has a bone-deep wound on right foot, extensive gangrene on both legs. HX of diabetes, possible UTI. Patient fell in the bathroom this morning. Noncompliant with medications. Unsanitary living environment. Pt has a case with adult protective services open. A & O times 4.

## 2020-09-08 NOTE — Progress Notes (Signed)
Notified bedside nurse of need to draw repeat lactic acid. 

## 2020-09-08 NOTE — Consult Note (Signed)
Chief Complaint: Bilateral lower extremity swelling and foul necrotic odor from his foot and ankle region for several weeks.  History: [name]. is a 69 y.o. male with past medical history of type 2 diabetes, venous insufficiencies, lymphedema who presented to the ED via EMS due to right foot wound.  Patient denies any pain, fever, chills, chest pain, shortness of breath, abdominal pain, urinary symptoms.  He is not sure if his legs are more swollen.  He also did not check his feet.  Patient lives at home with his wife who is hospitalized since 2 weeks ago.  Patient takes poor care of himself and is not taking any medications.  Patient states that he has not taking a shower for a few weeks.  He states that he still ambulating around the house.  Her report a episode of fall this morning in the bathroom and denies head injuries or loss of consciousness.  He pulled himself up.  Patient was seen recently at the wound care center for left foot wound.   Past Medical History:  Diagnosis Date  . Arthritis   . Back pain    occasionally  . BPH (benign prostatic hypertrophy)    takes Uroxatral daily as well as Finasteride   . Diabetes mellitus without complication ([place])    takes Metformin daily  . Diabetic foot infection ([place]) 10/22/2019  . GERD (gastroesophageal reflux disease)    no meds  . Hepatitis hx of   at age 69  . Hypertension    takes Losartan and HCTZ daily  . Joint pain   . Lymphedema 10/22/2019  . Urinary frequency   . Urinary urgency   . Venous stasis dermatitis 11/19/2019    Allergies  Allergen Reactions  . Codeine Swelling    Water retention  . Sulfur Other (See Comments)    Not sure of reaction a young child   Review of systems: No fevers chills or unintended weight loss. History of reflux.  No current nausea or vomiting. See past medical history for remainder of the pertinent positives.   No current facility-administered medications on file prior to  encounter.   Current Outpatient Medications on File Prior to Encounter  Medication Sig Dispense Refill  . apixaban (ELIQUIS) 5 MG TABS tablet Take 1 tablet (5 mg total) by mouth 2 (two) times daily. 60 tablet 0  . Cholecalciferol (VITAMIN D3) 5000 UNITS TABS Take 1 tablet by mouth daily. Reported on 04/29/2016    . finasteride (PROSCAR) 5 MG tablet Take 5 mg by mouth daily.    . fluticasone (FLONASE) 50 MCG/ACT nasal spray Place 2 sprays into both nostrils daily as needed for allergies or rhinitis.    Marland Kitchen GABAPENTIN PO Take 800 mg by mouth daily as needed (pain).     Marland Kitchen glipiZIDE (GLUCOTROL XL) 5 MG 24 hr tablet Take 10 mg by mouth daily with breakfast.     . metoprolol tartrate (LOPRESSOR) 50 MG tablet Take 1 tablet (50 mg total) by mouth 2 (two) times daily for 30 doses. 60 tablet 3  . potassium chloride SA (KLOR-CON) 20 MEQ tablet Take 1 tablet by mouth daily 90 tablet 1  . torsemide (DEMADEX) 20 MG tablet Take 2 tablets (40 mg total) by mouth 2 (two) times daily. 120 tablet 0    Physical Exam: Vitals:   09/08/20 1441 09/08/20 1600  BP: 125/89 112/84  Pulse: 69   Resp: (!) 23 (!) 28  Temp: 98.1 F (36.7  C)   SpO2: 95%    Body mass index is 40.61 kg/m. Patient is alert and oriented x3 No shortness of breath or chest pain.  Lungs are clear to auscultation bilaterally. Cardiac: Regular rate and rhythm no rubs gallops murmurs. Abdomen is soft and nontender with no rebound tenderness.  He denies a history of incontinence of bowel and bladder. Bilateral lower extremity swelling with multiple eschar/nonwound healing wounds in the foot and ankle region.  Since swelling and thickened skin prohibit palpation of his pulses bilaterally. No apparent hip or knee pain with isolated joint range of motion.  Image: DG Foot Complete Right  Result Date: 09/08/2020 CLINICAL DATA:  Bone deep wound on right foot, extensive gangrene in both legs EXAM: RIGHT FOOT COMPLETE - 3+ VIEW COMPARISON:  None.  FINDINGS: Extensive soft tissue swelling/thickening of the lower extremity. Reported site of ulceration is difficult to visualize radiographically. Suspect some ulceration along the plantar aspect of the calcaneus and involving the digits. Extensive radiodense foreign bodies projecting over digits appear external to the soft tissues. Some additional mineralization is noted along the plantar surface as well. There is a likely fracture dislocation of the fifth proximal interphalangeal joint with dorsal-lateral subluxation. Additional clawtoe deformities of the second through fourth rays. Marked subcortical lucency of the head of the fifth and to a lesser extent fourth metatarsals raise suspicion for potential osteomyelitis. Correlate for sites of ulceration and clinical findings. Atherosclerotic calcification of the soft tissues. IMPRESSION: 1. Extensive soft tissue swelling/thickening of the lower extremity. 2. Suspect ulceration along the plantar aspect of the calcaneus and involving the digits/head of the metatarsals. 3. Subcortical lucency particularly involving the head of the fifth and to a lesser extent fourth metatarsals raises suspicion for possible osteomyelitis. 4. Likely fracture dislocation of the fifth proximal interphalangeal joint with dorsal-lateral subluxation. 5. Extensive radiodense foreign bodies projecting along the plantar skin surface and about the digits, correlate with visual inspection. Electronically Signed   By: Lovena Le M.D.   On: 09/08/2020 16:26    A/P: [name] is a very pleasant 69 year old diabetic who is poorly controlled has had several weeks of significant bilateral lower extremity swelling as well as foul-smelling odor secondary to eschar and multiple ulcerations.  He presents to the ER today for further treatment and evaluation.  Clinical exam demonstrates significant swelling in the lower extremity.  Loss of sensation light touch diffusely in the lower extremity.   Significant ecchymosis from the mid calf down.  Unable to assess his vascular status.  Plan: At this point time the patient has significant advanced bilateral lower extremity cellulitis and nonhealing wound ulcers.  Recommend bilateral lower extremity ABIs to determine if he still has blood supply.  Patient more than likely require surgical debridement and possible amputation.  As this is beyond the scope of my practice I will consult Dr. Meridee Score to take over care for definitive management.  Recommend the patient is admitted to the medical service for IV antibiotic therapy.  Pictures of the wound are in the ER resident note.  There is any further questions or concerns please do not hesitate to contact me.

## 2020-09-08 NOTE — Progress Notes (Signed)
ANTICOAGULATION CONSULT NOTE - Initial Consult  Pharmacy Consult for heparin infusion Indication: atrial fibrillation  Allergies  Allergen Reactions  . Codeine Swelling    Water retention  . Sulfur Other (See Comments)    Not sure of reaction a young child    Patient Measurements: Height: 5\' 9"  (175.3 cm) Weight: 124.7 kg (275 lb) IBW/kg (Calculated) : 70.7 Heparin Dosing Weight: 99.3 kg  Vital Signs: Temp: 98.1 F (36.7 C) (10/25 1441) Temp Source: Oral (10/25 1441) BP: 124/86 (10/25 1726) Pulse Rate: 72 (10/25 1726)  Labs: Recent Labs    09/08/20 1446  HGB 13.1  HCT 41.7  PLT 299  CREATININE 1.98*    Estimated Creatinine Clearance: 44.7 mL/min (A) (by C-G formula based on SCr of 1.98 mg/dL (H)).   Medical History: Past Medical History:  Diagnosis Date  . Arthritis   . Back pain    occasionally  . BPH (benign prostatic hypertrophy)    takes Uroxatral daily as well as Finasteride   . Diabetes mellitus without complication ([place])    takes Metformin daily  . Diabetic foot infection ([place]) 10/22/2019  . GERD (gastroesophageal reflux disease)    no meds  . Hepatitis hx of   at age 9  . Hypertension    takes Losartan and HCTZ daily  . Joint pain   . Lymphedema 10/22/2019  . Urinary frequency   . Urinary urgency   . Venous stasis dermatitis 11/19/2019    Medications:  Although apixaban 5 mg po BID was prescribed in March 2021 for atrial fibrillation, there is no dispense history.  Per today's medication reconciliation, patient is not taking apixaban.   Assessment: Initiate heparin infusion per pharmacy consult for atrial fibrillation.  Except for elevated WBC, CBC WNL. No PTT or baseline heparin level.  Goal of Therapy:  Heparin level 0.3-0.7 units/ml Monitor platelets by anticoagulation protocol: Yes   Plan:  Baseline PTT and heparin level Heparin 5000 units IV bolus then Heparin 1500 units/hr IV infusion Obtain 8 hr heparin level after initiation of  infusion Monitor daily HL, CBC, s/s bleeding  G[place]ola Wheat P. Legrand Como, PharmD, BCPS Clinical Pharmacist [facility] Sidle 09/08/2020 7:03 PM

## 2020-09-08 NOTE — Progress Notes (Signed)
A consult was received from an ED physician for Zosyn and Vancomycin per pharmacy dosing.  The patient's profile has been reviewed for ht/wt/allergies/indication/available labs.   A one time order has been placed for Zosyn 3.375g IV over 30 minutes x 1 and Vancomycin 2 g IV x 1.  Further antibiotics/pharmacy consults should be ordered by admitting physician if indicated.                       Thank you, Efraim Kaufmann, PharmD, BCPS 09/08/2020  4:44 PM

## 2020-09-08 NOTE — Progress Notes (Signed)
Pharmacy Antibiotic Note  [name]. is a 69 y.o. male admitted on 09/08/2020 with wound infection.  Pharmacy has been consulted for cefepime and vancomycin dosing.  This evening patient is afebrile, WBC elevated, and serum creatinine elevated.  Vancomycin 2 g IV x 1 and Zosyn 3.375 g IV x 1 administered today in ED.  Plan: Cefepime 2 g IV every 12 hours Continue vancomycin 1500 mg IV every 24 hours to reach predicted target trough of 15-20 Monitor clinical picture, renal function, vancomycin trough level if indicated F/U C&S, abx deescalation / LOT   Height: 5\' 9"  (175.3 cm) Weight: 124.7 kg (275 lb) IBW/kg (Calculated) : 70.7  Temp (24hrs), Avg:98.1 F (36.7 C), Min:98.1 F (36.7 C), Max:98.1 F (36.7 C)  Recent Labs  Lab 09/08/20 1446 09/08/20 1726  WBC 20.1*  --   CREATININE 1.98*  --   LATICACIDVEN 2.7* 1.8    Estimated Creatinine Clearance: 44.7 mL/min (A) (by C-G formula based on SCr of 1.98 mg/dL (H)).    Allergies  Allergen Reactions  . Codeine Swelling    Water retention  . Sulfur Other (See Comments)    Not sure of reaction a young child    Antimicrobials this admission: 10/25 Zosyn x 1 10/25 Vancomycin  >>  10/25 cefepime >>  Dose adjustments this admission:   Microbiology results: 10/25 BCx: sent 10/25 Resp panel: negative  Thank you for allowing pharmacy to be a part of this patient's care.  Efraim Kaufmann, PharmD, BCPS 09/08/2020 7:58 PM

## 2020-09-09 ENCOUNTER — Other Ambulatory Visit: Payer: Self-pay

## 2020-09-09 ENCOUNTER — Encounter (HOSPITAL_COMMUNITY): Payer: Medicare Other

## 2020-09-09 DIAGNOSIS — E11628 Type 2 diabetes mellitus with other skin complications: Secondary | ICD-10-CM | POA: Diagnosis not present

## 2020-09-09 DIAGNOSIS — L089 Local infection of the skin and subcutaneous tissue, unspecified: Secondary | ICD-10-CM | POA: Diagnosis not present

## 2020-09-09 DIAGNOSIS — I5033 Acute on chronic diastolic (congestive) heart failure: Secondary | ICD-10-CM | POA: Diagnosis not present

## 2020-09-09 DIAGNOSIS — L97509 Non-pressure chronic ulcer of other part of unspecified foot with unspecified severity: Secondary | ICD-10-CM

## 2020-09-09 DIAGNOSIS — E11621 Type 2 diabetes mellitus with foot ulcer: Secondary | ICD-10-CM

## 2020-09-09 DIAGNOSIS — M869 Osteomyelitis, unspecified: Secondary | ICD-10-CM

## 2020-09-09 DIAGNOSIS — M86271 Subacute osteomyelitis, right ankle and foot: Secondary | ICD-10-CM | POA: Diagnosis not present

## 2020-09-09 DIAGNOSIS — I872 Venous insufficiency (chronic) (peripheral): Secondary | ICD-10-CM

## 2020-09-09 LAB — COMPREHENSIVE METABOLIC PANEL
ALT: 19 U/L (ref 0–44)
AST: 26 U/L (ref 15–41)
Albumin: 2.5 g/dL — ABNORMAL LOW (ref 3.5–5.0)
Alkaline Phosphatase: 74 U/L (ref 38–126)
Anion gap: 15 (ref 5–15)
BUN: 42 mg/dL — ABNORMAL HIGH (ref 8–23)
CO2: 20 mmol/L — ABNORMAL LOW (ref 22–32)
Calcium: 7.6 mg/dL — ABNORMAL LOW (ref 8.9–10.3)
Chloride: 97 mmol/L — ABNORMAL LOW (ref 98–111)
Creatinine, Ser: 2.19 mg/dL — ABNORMAL HIGH (ref 0.61–1.24)
GFR, Estimated: 31 mL/min — ABNORMAL LOW (ref >60)
Glucose, Bld: 184 mg/dL — ABNORMAL HIGH (ref 70–99)
Potassium: 3.8 mmol/L (ref 3.5–5.1)
Sodium: 132 mmol/L — ABNORMAL LOW (ref 135–145)
Total Bilirubin: 0.9 mg/dL (ref 0.3–1.2)
Total Protein: 7.3 g/dL (ref 6.5–8.1)

## 2020-09-09 LAB — HEMOGLOBIN A1C
Hgb A1c MFr Bld: 9.6 % — ABNORMAL HIGH (ref 4.8–5.6)
Mean Plasma Glucose: 229 mg/dL

## 2020-09-09 LAB — HEPARIN LEVEL (UNFRACTIONATED)
Heparin Unfractionated: <0.1 IU/mL — ABNORMAL LOW (ref 0.30–0.70)
Heparin Unfractionated: 0.15 IU/mL — ABNORMAL LOW (ref 0.30–0.70)

## 2020-09-09 LAB — GLUCOSE, CAPILLARY
Glucose-Capillary: 176 mg/dL — ABNORMAL HIGH (ref 70–99)
Glucose-Capillary: 190 mg/dL — ABNORMAL HIGH (ref 70–99)
Glucose-Capillary: 196 mg/dL — ABNORMAL HIGH (ref 70–99)
Glucose-Capillary: 134 mg/dL — ABNORMAL HIGH (ref 70–99)

## 2020-09-09 LAB — MAGNESIUM: Magnesium: 1.4 mg/dL — ABNORMAL LOW (ref 1.7–2.4)

## 2020-09-09 LAB — CBC
HCT: 37.5 % — ABNORMAL LOW (ref 39.0–52.0)
Hemoglobin: 11.7 g/dL — ABNORMAL LOW (ref 13.0–17.0)
MCH: 27.2 pg (ref 26.0–34.0)
MCHC: 31.2 g/dL (ref 30.0–36.0)
MCV: 87.2 fL (ref 80.0–100.0)
Platelets: 238 10*3/uL (ref 150–400)
RBC: 4.3 MIL/uL (ref 4.22–5.81)
RDW: 14.5 % (ref 11.5–15.5)
WBC: 12.7 10*3/uL — ABNORMAL HIGH (ref 4.0–10.5)
nRBC: 0 % (ref 0.0–0.2)

## 2020-09-09 LAB — HIV ANTIBODY (ROUTINE TESTING W REFLEX): HIV Screen 4th Generation wRfx: NONREACTIVE

## 2020-09-09 MED ORDER — FUROSEMIDE 10 MG/ML IJ SOLN
40.0000 mg | Freq: Every day | INTRAMUSCULAR | Status: DC
  Administered 2020-09-09 – 2020-09-12 (×4): 40 mg via INTRAVENOUS
  Filled 2020-09-09 (×4): qty 4

## 2020-09-09 MED ORDER — HEPARIN (PORCINE) 25000 UT/250ML-% IV SOLN
1800.0000 [IU]/h | INTRAVENOUS | Status: DC
  Administered 2020-09-09 (×2): 1800 [IU]/h via INTRAVENOUS
  Filled 2020-09-09: qty 250

## 2020-09-09 MED ORDER — HEPARIN BOLUS VIA INFUSION
3000.0000 [IU] | INTRAVENOUS | Status: AC
  Administered 2020-09-09: 3000 [IU] via INTRAVENOUS
  Filled 2020-09-09: qty 3000

## 2020-09-09 MED ORDER — HEPARIN BOLUS VIA INFUSION
2500.0000 [IU] | Freq: Once | INTRAVENOUS | Status: AC
  Administered 2020-09-09: 2500 [IU] via INTRAVENOUS
  Filled 2020-09-09: qty 2500

## 2020-09-09 MED ORDER — MELATONIN 3 MG PO TABS
6.0000 mg | ORAL_TABLET | Freq: Every evening | ORAL | Status: DC | PRN
  Administered 2020-09-09 – 2020-09-23 (×13): 6 mg via ORAL
  Filled 2020-09-09 (×13): qty 2

## 2020-09-09 MED ORDER — DILTIAZEM HCL-DEXTROSE 125-5 MG/125ML-% IV SOLN (PREMIX): 5.0000 mg/h | INTRAVENOUS | Status: DC

## 2020-09-09 MED ORDER — HEPARIN (PORCINE) 25000 UT/250ML-% IV SOLN
2100.0000 [IU]/h | INTRAVENOUS | Status: DC
  Administered 2020-09-09 (×2): 2100 [IU]/h via INTRAVENOUS
  Filled 2020-09-09: qty 250

## 2020-09-09 MED ORDER — LIVING WELL WITH DIABETES BOOK
Freq: Once | Status: AC
  Filled 2020-09-09: qty 1

## 2020-09-09 MED ORDER — METOPROLOL TARTRATE 25 MG PO TABS
25.0000 mg | ORAL_TABLET | Freq: Once | ORAL | Status: AC
  Administered 2020-09-09: 25 mg via ORAL
  Filled 2020-09-09: qty 1

## 2020-09-09 MED ORDER — DILTIAZEM LOAD VIA INFUSION
20.0000 mg | Freq: Once | INTRAVENOUS | Status: DC
  Filled 2020-09-09: qty 20

## 2020-09-09 MED ORDER — METOPROLOL TARTRATE 50 MG PO TABS
50.0000 mg | ORAL_TABLET | Freq: Two times a day (BID) | ORAL | Status: DC
  Administered 2020-09-09 – 2020-09-11 (×4): 50 mg via ORAL
  Filled 2020-09-09 (×4): qty 1

## 2020-09-09 NOTE — Progress Notes (Signed)
Patient arrived via stretcher to room 1416. A&Ox4.  Patient transferred to bed and given a bath.  Skin warm,dry and flaky. Moist under abdominal folds and groin.  Reddened under abdominal folds with small open area approx 4cm L and 1cm wide to right abd fold.  Coccyx and buttocks purple in color but no open areas noted.  BLE discolored, thick skin with wounds to right lateral sole approx 3x3 cm and wound to right heel approx 6x6cm.  Both feet have a foul odor with dark appearance with hairs coming out (see pictures MD notes).

## 2020-09-09 NOTE — Progress Notes (Signed)
PROGRESS NOTE    Clements.  DVV:[id number] DOB: 14-Nov-1951 DOA: 09/08/2020 PCP: Aura Dials, MD    Brief Narrative:  69 y.o. male with medical history significant of DM2, diastolic chf, hx afib on eliquis, HTN, presents to ED after being found down in the bathroom this AM during welfare check. Per report, pt noted to have an unsanitary living condition and is currently followed by adult protective services. Patient reports falling while standing up from bathroom this AM without LOC, resulting in fall onto R chest and resultant R lower chest pain. Patient was found down and brought into ED.   Of note, patient reports stopping all medications several months ago with exception of torsemide because "I didn't think I needed them anymore." Pt has only been taking torsemide PRN swelling.  Assessment & Plan:   Principal Problem:   Osteomyelitis of fifth toe of right foot (HCC) Active Problems:   Type 2 diabetes mellitus (HCC)   Essential hypertension   Acute on chronic heart failure with preserved ejection fraction (HFpEF) ([place])   Diabetic foot infection ([place])   Atrial fibrillation ([place])   Sepsis ([place])   ARF (acute renal failure) ([place])  1. Necrotic diabetic foot infection, possible osteomyelitis with sepsis present on admit with lactic acidosis 1. Pt was previously followed by wound clinic.  2. Chart reviewed, pt had prior concerns of vasculopathy prior to admit 3. Repeat ABI has been ordered, pending 4. If ABI is abnormal, consider Vascular Surgery at that time 5. Orthopedic Surgery consulted. Seen by Dr. Sharol Given this AM. Recommendation for keeping foot and area around wound clean. Also recommendation for 5th toe amputation, however pt apparently declined surgery this AM. Question pt's understanding of his severity of illness 6. Vanc and cefepime was started in ED, will continue 7. Follow culture results 8. BP stable. See below. Pt with LE edema in the setting of known CHF,  thus will hold further IVF 2. DM2  1. A1c now up to 9.6 from 7.3 in 01/31/20 2. Pt supposed to be on oral hyperglycemic meds, however admits to being noncompliant because "I didn't think I need it" 3. Now on SSI coverage 3. HTN 1. BP currently stable 2. Pt has been noncompliant with home meds 3. Metoprolol restarted, titating as BP and HR can tolerate 4. Acute on Chronic diastolic CHF 1. Initially appeared euvolemic on presentation 2. Pt was given IVF hydration on admit for presenting sepsis 3. BP remains stable. LE now with pitting edema. 4. Most recent 2d echo from 3/21 with EF of 40-45%, unable to evaluate diastolic dysfunction at that time 5. Will hold further IVF and start daily IV lasix 6. Repeat bmet in AM 7. Follow I/o and daily wts 5. Afib with RVR 1. Tachycardic in ED, likely secondary to presenting sepsis 2. Given IVF hydration since presentation 3. Pt admits noncompliance to beta blocker prior to admit. Metoprolol restarted at time of presentation. 4. Remains tachycardic this AM. Increase metoprolol to 50mg  BID 5. Continue PRN labetalol IV for HR>120 6. Supposed to be on eliquis prior to admit, however pt has been noncompliant and not taken eliquis x several months 7. Will continue on heparin gtt for now 6. ARF 1. Presenting Cr of 1.9 2. Cr improved to 1.12 with IVF overnight 3. Repeat bmet in AM 7. Acquired thrombophilia  1. Per above, pt had been noncompliant with eliquis x several months prior to admit 2. Continue on heparin gtt for now 8. Medical noncompliance  DVT prophylaxis: Heparin gtt Code Status: Full Family Communication: Pt in room, family not at bedside  Status is: Inpatient  Remains inpatient appropriate because:Unsafe d/c plan, IV treatments appropriate due to intensity of illness or inability to take PO and Inpatient level of care appropriate due to severity of illness   Dispo: The patient is from: Home              Anticipated d/c is to:  Unclear at this time, awaiting PT/OT eval              Anticipated d/c date is: 2 days              Patient currently is not medically stable to d/c.       Consultants:   Orthopedic Surgery  Procedures:     Antimicrobials: Anti-infectives (From admission, onward)   Start     Dose/Rate Route Frequency Ordered Stop   09/09/20 1700  vancomycin (VANCOREADY) IVPB 1500 mg/300 mL        1,500 mg 150 mL/hr over 120 Minutes Intravenous Every 24 hours 09/08/20 1956     09/09/20 1000  ceFEPIme (MAXIPIME) 2 g in sodium chloride 0.9 % 100 mL IVPB        2 g 200 mL/hr over 30 Minutes Intravenous Every 12 hours 09/08/20 1947     09/08/20 2200  ceFEPIme (MAXIPIME) 2 g in sodium chloride 0.9 % 100 mL IVPB        2 g 200 mL/hr over 30 Minutes Intravenous  Once 09/08/20 1754 09/08/20 2206   09/08/20 1800  vancomycin (VANCOCIN) IVPB 1000 mg/200 mL premix  Status:  Discontinued        1,000 mg 200 mL/hr over 60 Minutes Intravenous  Once 09/08/20 1754 09/08/20 1756   09/08/20 1630  vancomycin (VANCOREADY) IVPB 2000 mg/400 mL        2,000 mg 200 mL/hr over 120 Minutes Intravenous  Once 09/08/20 1551 09/08/20 1842   09/08/20 1600  piperacillin-tazobactam (ZOSYN) IVPB 3.375 g        3.375 g 100 mL/hr over 30 Minutes Intravenous  Once 09/08/20 1549 09/08/20 1645       Subjective: States feeling better today. When told about his rapid HR, pt state "just fix it"  Objective: Vitals:   09/09/20 0013 09/09/20 0357 09/09/20 0843 09/09/20 1323  BP: 102/75 100/85 107/71 (!) 156/122  Pulse: (!) 117 70 (!) 107 79  Resp: 20 18  16   Temp: 97.7 F (36.5 C) 98.3 F (36.8 C)  98.8 F (37.1 C)  TempSrc: Oral Oral  Oral  SpO2: 97% 97%  91%  Weight:      Height:        Intake/Output Summary (Last 24 hours) at 09/09/2020 1712 Last data filed at 09/09/2020 1500 Gross per 24 hour  Intake 1938.38 ml  Output --  Net 1938.38 ml   Filed Weights   09/08/20 1438  Weight: 124.7 kg     Examination:  General exam: Appears calm and comfortable  Respiratory system: Clear to auscultation. Respiratory effort normal. Cardiovascular system: S1 & S2 heard, tachycardic, irregularly irregular Gastrointestinal system: Abdomen is nondistended, soft and nontender. No organomegaly or masses felt. Normal bowel sounds heard. Central nervous system: Alert. No focal neurological deficits. Extremities: Symmetric 5 x 5 power. Skin: No rashes, B LE with dressings wrapped in place Psychiatry: Mood appears normal, affect seems appropriate.   Data Reviewed: I have personally reviewed following labs and imaging studies  CBC:  Recent Labs  Lab 09/08/20 1446 09/08/20 1904 09/09/20 0726  WBC 20.1* 17.7* 12.7*  NEUTROABS 17.7* 15.2*  --   HGB 13.1 12.4* 11.7*  HCT 41.7 38.7* 37.5*  MCV 85.3 85.2 87.2  PLT 299 280 573   Basic Metabolic Panel: Recent Labs  Lab 09/08/20 1446 09/08/20 1904 09/09/20 0722 09/09/20 0739  NA 132* 134* 132*  --   K 3.5 3.8 3.8  --   CL 96* 99 97*  --   CO2 22 23 20*  --   GLUCOSE 260* 205* 184*  --   BUN 37* 40* 42*  --   CREATININE 1.98* 1.96* 2.19*  --   CALCIUM 8.0* 7.7* 7.6*  --   MG  --   --   --  1.4*   GFR: Estimated Creatinine Clearance: 40.4 mL/min (A) (by C-G formula based on SCr of 2.19 mg/dL (H)). Liver Function Tests: Recent Labs  Lab 09/08/20 1446 09/09/20 0722  AST 23 26  ALT 18 19  ALKPHOS 86 74  BILITOT 1.2 0.9  PROT 7.9 7.3  ALBUMIN 2.8* 2.5*   No results for input(s): LIPASE, AMYLASE in the last 168 hours. No results for input(s): AMMONIA in the last 168 hours. Coagulation Profile: No results for input(s): INR, PROTIME in the last 168 hours. Cardiac Enzymes: No results for input(s): CKTOTAL, CKMB, CKMBINDEX, TROPONINI in the last 168 hours. BNP (last 3 results) No results for input(s): PROBNP in the last 8760 hours. HbA1C: Recent Labs    09/08/20 1904  HGBA1C 9.6*   CBG: Recent Labs  Lab 09/08/20 2101  09/09/20 0739 09/09/20 1129 09/09/20 1700  GLUCAP 220* 176* 190* 196*   Lipid Profile: No results for input(s): CHOL, HDL, LDLCALC, TRIG, CHOLHDL, LDLDIRECT in the last 72 hours. Thyroid Function Tests: No results for input(s): TSH, T4TOTAL, FREET4, T3FREE, THYROIDAB in the last 72 hours. Anemia Panel: No results for input(s): VITAMINB12, FOLATE, FERRITIN, TIBC, IRON, RETICCTPCT in the last 72 hours. Sepsis Labs: Recent Labs  Lab 09/08/20 1446 09/08/20 1726  LATICACIDVEN 2.7* 1.8    Recent Results (from the past 240 hour(s))  Respiratory Panel by RT PCR (Flu A&B, Covid) - Nasopharyngeal Swab     Status: None   Collection Time: 09/08/20  5:13 PM   Specimen: Nasopharyngeal Swab  Result Value Ref Range Status   SARS Coronavirus 2 by RT PCR NEGATIVE NEGATIVE Final    Comment: (NOTE) SARS-CoV-2 target nucleic acids are NOT DETECTED.  The SARS-CoV-2 RNA is generally detectable in upper respiratoy specimens during the acute phase of infection. The lowest concentration of SARS-CoV-2 viral copies this assay can detect is 131 copies/mL. A negative result does not preclude SARS-Cov-2 infection and should not be used as the sole basis for treatment or other patient management decisions. A negative result may occur with  improper specimen collection/handling, submission of specimen other than nasopharyngeal swab, presence of viral mutation(s) within the areas targeted by this assay, and inadequate number of viral copies (<131 copies/mL). A negative result must be combined with clinical observations, patient history, and epidemiological information. The expected result is Negative.  Fact Sheet for Patients:  PinkCheek.be  Fact Sheet for Healthcare Providers:  GravelBags.it  This test is no t yet approved or cleared by the [place] FDA and  has been authorized for detection and/or diagnosis of SARS-CoV-2 by FDA under an  Emergency Use Authorization (EUA). This EUA will remain  in effect (meaning this test can be used) for the duration  of the COVID-19 declaration under Section 564(b)(1) of the Act, 21 U.S.C. section 360bbb-3(b)(1), unless the authorization is terminated or revoked sooner.     Influenza A by PCR NEGATIVE NEGATIVE Final   Influenza B by PCR NEGATIVE NEGATIVE Final    Comment: (NOTE) The Xpert Xpress SARS-CoV-2/FLU/RSV assay is intended as an aid in  the diagnosis of influenza from Nasopharyngeal swab specimens and  should not be used as a sole basis for treatment. Nasal washings and  aspirates are unacceptable for Xpert Xpress SARS-CoV-2/FLU/RSV  testing.  Fact Sheet for Patients: PinkCheek.be  Fact Sheet for Healthcare Providers: GravelBags.it  This test is not yet approved or cleared by the [place] FDA and  has been authorized for detection and/or diagnosis of SARS-CoV-2 by  FDA under an Emergency Use Authorization (EUA). This EUA will remain  in effect (meaning this test can be used) for the duration of the  Covid-19 declaration under Section 564(b)(1) of the Act, 21  U.S.C. section 360bbb-3(b)(1), unless the authorization is  terminated or revoked. Performed at [facility], [place] [address], [place], [place] [address]   Blood Cultures x 2 sites     Status: None (Preliminary result)   Collection Time: 09/08/20  7:04 PM   Specimen: BLOOD  Result Value Ref Range Status   Specimen Description   Final    BLOOD LEFT WRIST Performed at [facility] [address] [place], [place] [address]    Special Requests   Final    BOTTLES DRAWN AEROBIC AND ANAEROBIC Blood Culture adequate volume Performed at [facility] [address] [place], [place] [address]    Culture   Final    NO GROWTH < 12 HOURS Performed at [facility] [address]  [place], [place] [address]    Report Status PENDING  Incomplete  Culture, blood (x 2)     Status: None (Preliminary result)   Collection Time: 09/08/20  9:03 PM   Specimen: BLOOD LEFT HAND  Result Value Ref Range Status   Specimen Description   Final    BLOOD LEFT HAND Performed at [facility] [address], [place], [place] [address]    Special Requests   Final    BOTTLES DRAWN AEROBIC AND ANAEROBIC Blood Culture adequate volume Performed at [facility] [address] [place], [place] [address]    Culture   Final    NO GROWTH < 12 HOURS Performed at [facility] [address] [place], [place] [address]    Report Status PENDING  Incomplete     Radiology Studies: DG Foot Complete Right  Result Date: 09/08/2020 CLINICAL DATA:  Bone deep wound on right foot, extensive gangrene in both legs EXAM: RIGHT FOOT COMPLETE - 3+ VIEW COMPARISON:  None. FINDINGS: Extensive soft tissue swelling/thickening of the lower extremity. Reported site of ulceration is difficult to visualize radiographically. Suspect some ulceration along the plantar aspect of the calcaneus and involving the digits. Extensive radiodense foreign bodies projecting over digits appear external to the soft tissues. Some additional mineralization is noted along the plantar surface as well. There is a likely fracture dislocation of the fifth proximal interphalangeal joint with dorsal-lateral subluxation. Additional clawtoe deformities of the second through fourth rays. Marked subcortical lucency of the head of the fifth and to a lesser extent fourth metatarsals raise suspicion for potential osteomyelitis. Correlate for sites of ulceration and clinical findings. Atherosclerotic calcification of the  soft tissues. IMPRESSION: 1. Extensive soft tissue swelling/thickening of the lower extremity. 2. Suspect ulceration along the plantar aspect of the calcaneus and involving the digits/head of the  metatarsals. 3. Subcortical lucency particularly involving the head of the fifth and to a lesser extent fourth metatarsals raises suspicion for possible osteomyelitis. 4. Likely fracture dislocation of the fifth proximal interphalangeal joint with dorsal-lateral subluxation. 5. Extensive radiodense foreign bodies projecting along the plantar skin surface and about the digits, correlate with visual inspection. Electronically Signed   By: Lovena Le M.D.   On: 09/08/2020 16:26    Scheduled Meds: . insulin aspart  0-15 Units Subcutaneous TID WC  . insulin aspart  0-5 Units Subcutaneous QHS  . metoprolol tartrate  50 mg Oral BID   Continuous Infusions: . sodium chloride 75 mL/hr at 09/08/20 2024  . ceFEPime (MAXIPIME) IV 2 g (09/09/20 1015)  . heparin 1,800 Units/hr (09/09/20 0946)  . vancomycin       LOS: 1 day   Marylu Lund, MD [facility] Pager On Amion  If 7PM-7AM, please contact night-coverage 09/09/2020, 5:12 PM

## 2020-09-09 NOTE — Consult Note (Addendum)
[facility] Nurse Consult Note: Reason for Consult: Consult requested for bilat feet/legs. X-ray indicates: "There is a likely fracture dislocation of the fifth proximal interphalangeal joint with dorsal-lateral subluxation. Additional clawtoe deformities of the second through fourth rays. Marked subcortical lucency of the head of the fifth and to a lesser extent fourth metatarsals raise suspicion for potential osteomyelitis." This is beyond the scope of practice for [facility] nurses.  Dr Sharol Given has performed a consult and pt declined surgery at this time.  [facility] team has been requested to apply Aquacel to the right foot wounds and then Profore to bilat legs.  Left leg with generalized edema and dry crusted skin; no open wounds or weeping.  Dry black foul-smelling crusted scabs to plantar foot and between toes. Attempted to wash/clean/remove but it is difficult since the layers of scabs are very adhered.  Right leg with generalized edema and dry crusted skin. Dry black foul-smelling crusted scabs to plantar foot and between toes.  Attempted to wash/clean/remove but it is difficult since the layers of scabs are very adhered.  Full thickness wound to right anterior foot is 3X3X.8cm, 100% yellow/black slough, strong foul odor, mod amt brown drainage. Right plantar heel with full thickness wound; 8X8X.5cm, 25% red, 75% slough/eschar, strong foul odor, mod amt brown drainage. Right anterior calf with red moist partial thickness wound, 5X5X.2cm, mod amt red drainage, some odor Applied Aquacel to wounds on RLE, then applied 4 layer Profore compression wraps to bilat legs as requested.  WOC will change Q Tues while in the hospital; pt will need home health assistance to change Aquacel/compression wraps weekly after discharge.   One hour spent performing this consult.  Julien Girt MSN, RN, [place], [place], [place]

## 2020-09-09 NOTE — Progress Notes (Signed)
ANTICOAGULATION CONSULT NOTE - Follow Up Consult  Pharmacy Consult for Heparin Indication: History of Afib  Allergies  Allergen Reactions  . Codeine Swelling    Water retention  . Sulfur Other (See Comments)    Not sure of reaction a young child   Patient Measurements: Height: 5\' 9"  (175.3 cm) Weight: 124.7 kg (275 lb) IBW/kg (Calculated) : 70.7 Heparin Dosing Weight: 99 kg  Vital Signs: Temp: 98.8 F (37.1 C) (10/26 1323) Temp Source: Oral (10/26 1323) BP: 156/122 (10/26 1323) Pulse Rate: 79 (10/26 1323)  Labs: Recent Labs    09/08/20 1446 09/08/20 1446 09/08/20 1904 09/08/20 2103 09/09/20 0722 09/09/20 0726 09/09/20 0739 09/09/20 1733  HGB 13.1   < > 12.4*  --   --  11.7*  --   --   HCT 41.7  --  38.7*  --   --  37.5*  --   --   PLT 299  --  280  --   --  238  --   --   APTT  --   --   --  44*  --   --   --   --   HEPARINUNFRC  --   --   --  <0.10*  --   --  <0.10* 0.15*  CREATININE 1.98*  --  1.96*  --  2.19*  --   --   --    < > = values in this interval not displayed.   Estimated Creatinine Clearance: 40.4 mL/min (A) (by C-G formula based on SCr of 2.19 mg/dL (H)).  Medications:  Infusions:  . ceFEPime (MAXIPIME) IV 2 g (09/09/20 1015)  . heparin 1,800 Units/hr (09/09/20 0946)  . vancomycin 1,500 mg (09/09/20 1826)   Assessment: 30 yoM admitted on 10/25 with necrotic diabetic foot infection, orthopedic surgery is consulted for management.  PMH is significant for Afib and was prescribed apixaban, but was NOT taking any medications at home.  Baseline coags confirm no recent DOAC use as anti-Xa level is undetectable.   Pharmacy is consulted to dose Heparin.    Heparin level < 0.1 on heparin 1500 units/hr SCr elevated and increased to 2.19 CBC: Hgb decreased to 11.7, Plt WNL No s/s bleeding reported or documented  1730 HL = 0.15 units/ml, still subtherapeutic No bleed reported  Goal of Therapy:  Heparin level 0.3-0.7 units/ml Monitor platelets by  anticoagulation protocol: Yes   Plan:   Give heparin 2500 units bolus IV x 1  Increase Heparin rate to 2100 units/hr  Heparin level 8 hours after rate change  Daily heparin level and CBC  Continue to monitor H&H and platelets  Noralee Dutko, [place] [phone] 09/09/2020, 6:44 PM

## 2020-09-09 NOTE — Plan of Care (Signed)
Problem: Education: Goal: Knowledge of General Education information will improve Description: Including pain rating scale, medication(s)/side effects and non-pharmacologic comfort measures 09/09/2020 0215 by Micheline Chapman, RN Outcome: Progressing 09/09/2020 0211 by Micheline Chapman, RN Outcome: Progressing   Problem: Health Behavior/Discharge Planning: Goal: Ability to manage health-related needs will improve 09/09/2020 0215 by Micheline Chapman, RN Outcome: Progressing 09/09/2020 0211 by Micheline Chapman, RN Outcome: Progressing   Problem: Clinical Measurements: Goal: Ability to maintain clinical measurements within normal limits will improve 09/09/2020 0215 by Micheline Chapman, RN Outcome: Progressing 09/09/2020 0211 by Micheline Chapman, RN Outcome: Progressing Goal: Will remain free from infection 09/09/2020 0215 by Micheline Chapman, RN Outcome: Progressing 09/09/2020 0211 by Micheline Chapman, RN Outcome: Progressing Goal: Diagnostic test results will improve 09/09/2020 0215 by Micheline Chapman, RN Outcome: Progressing 09/09/2020 0211 by Micheline Chapman, RN Outcome: Progressing Goal: Respiratory complications will improve 09/09/2020 0215 by Micheline Chapman, RN Outcome: Progressing 09/09/2020 0211 by Micheline Chapman, RN Outcome: Progressing Goal: Cardiovascular complication will be avoided 09/09/2020 0215 by Micheline Chapman, RN Outcome: Progressing 09/09/2020 0211 by Micheline Chapman, RN Outcome: Progressing   Problem: Activity: Goal: Risk for activity intolerance will decrease 09/09/2020 0215 by Micheline Chapman, RN Outcome: Progressing 09/09/2020 0211 by Micheline Chapman, RN Outcome: Progressing   Problem: Nutrition: Goal: Adequate nutrition will be maintained 09/09/2020 0215 by Micheline Chapman, RN Outcome: Progressing 09/09/2020 0211 by Micheline Chapman, RN Outcome: Progressing   Problem: Coping: Goal: Level of anxiety will decrease 09/09/2020 0215 by Micheline Chapman, RN Outcome: Progressing 09/09/2020 0211 by Micheline Chapman, RN Outcome: Progressing   Problem: Elimination: Goal: Will not experience complications related to bowel motility 09/09/2020 0215 by Micheline Chapman, RN Outcome: Progressing 09/09/2020 0211 by Micheline Chapman, RN Outcome: Progressing Goal: Will not experience complications related to urinary retention 09/09/2020 0215 by Micheline Chapman, RN Outcome: Progressing 09/09/2020 0211 by Micheline Chapman, RN Outcome: Progressing   Problem: Pain Managment: Goal: General experience of comfort will improve 09/09/2020 0215 by Micheline Chapman, RN Outcome: Progressing 09/09/2020 0211 by Micheline Chapman, RN Outcome: Progressing   Problem: Safety: Goal: Ability to remain free from injury will improve 09/09/2020 0215 by Micheline Chapman, RN Outcome: Progressing 09/09/2020 0211 by Micheline Chapman, RN Outcome: Progressing   Problem: Skin Integrity: Goal: Risk for impaired skin integrity will decrease 09/09/2020 0215 by Micheline Chapman, RN Outcome: Progressing 09/09/2020 0211 by Micheline Chapman, RN Outcome: Progressing   Problem: Education: Goal: Ability to describe self-care measures that may prevent or decrease complications (Diabetes Survival Skills Education) will improve Outcome: Progressing Goal: Individualized Educational Video(s) Outcome: Progressing   Problem: Coping: Goal: Ability to adjust to condition or change in health will improve Outcome: Progressing   Problem: Fluid Volume: Goal: Ability to maintain a balanced intake and output will improve Outcome: Progressing   Problem: Health Behavior/Discharge Planning: Goal: Ability to identify and utilize available resources and services will improve Outcome: Progressing Goal: Ability to manage health-related needs will improve Outcome: Progressing   Problem: Metabolic: Goal: Ability to maintain appropriate glucose levels will improve Outcome:  Progressing   Problem: Nutritional: Goal: Maintenance of adequate nutrition will improve Outcome: Progressing Goal: Progress toward achieving an optimal weight will improve Outcome: Progressing   Problem: Skin Integrity: Goal: Risk for impaired skin integrity will decrease Outcome: Progressing   Problem: Tissue Perfusion: Goal: Adequacy of tissue perfusion will improve Outcome:  Progressing

## 2020-09-09 NOTE — Progress Notes (Signed)
Patient ID: [name]., male   DOB: 05-31-1951, 69 y.o.   MRN: [id number] Foot was cleansed by wound ostomy continence nursing therefore hydrotherapy was discontinued.  I will Follow-up with patient as an outpatient.  He will need to wear the [facility] on the right foot around the clock.

## 2020-09-09 NOTE — Progress Notes (Signed)
ANTICOAGULATION CONSULT NOTE - Follow Up Consult  Pharmacy Consult for Heparin Indication: History of Afib  Allergies  Allergen Reactions  . Codeine Swelling    Water retention  . Sulfur Other (See Comments)    Not sure of reaction a young child    Patient Measurements: Height: 5\' 9"  (175.3 cm) Weight: 124.7 kg (275 lb) IBW/kg (Calculated) : 70.7 Heparin Dosing Weight: 99 kg  Vital Signs: Temp: 98.3 F (36.8 C) (10/26 0357) Temp Source: Oral (10/26 0357) BP: 100/85 (10/26 0357) Pulse Rate: 70 (10/26 0357)  Labs: Recent Labs    09/08/20 1446 09/08/20 1904 09/08/20 2103  HGB 13.1 12.4*  --   HCT 41.7 38.7*  --   PLT 299 280  --   APTT  --   --  44*  HEPARINUNFRC  --   --  <0.10*  CREATININE 1.98* 1.96*  --     Estimated Creatinine Clearance: 45.1 mL/min (A) (by C-G formula based on SCr of 1.96 mg/dL (H)).   Medications:  Infusions:  . sodium chloride 75 mL/hr at 09/08/20 2024  . ceFEPime (MAXIPIME) IV    . heparin 1,500 Units/hr (09/08/20 2257)  . vancomycin      Assessment: 71 yoM admitted on 10/25 with necrotic diabetic foot infection, orthopedic surgery is consulted for management.  PMH is significant for Afib and was prescribed apixaban, but was NOT taking any medications at home.  Baseline coags confirm no recent DOAC use as anti-Xa level is undetectable.   Pharmacy is consulted to dose Heparin.    Heparin level < 0.1 on heparin 1500 units/hr SCr elevated and increased to 2.19 CBC: Hgb decreased to 11.7, Plt WNL No s/s bleeding reported or documented   Goal of Therapy:  Heparin level 0.3-0.7 units/ml Monitor platelets by anticoagulation protocol: Yes   Plan:   Give heparin 3000 units bolus IV x 1  Increase to heparin IV infusion at 1800 units/hr  Heparin level 8 hours after rate change  Daily heparin level and CBC  Continue to monitor H&H and platelets   Gretta Arab PharmD, BCPS Clinical Pharmacist [facility] main pharmacy  [phone] 09/09/2020 7:45 AM

## 2020-09-09 NOTE — Consult Note (Signed)
ORTHOPAEDIC CONSULTATION  REQUESTING PHYSICIAN: Donne Hazel, MD  Chief Complaint: Swelling ulceration right foot.  HPI: [name]. is a 69 y.o. male who presents with necrotic ulcer beneath the fifth metatarsal head right foot with venous insufficiency massive swelling in the right leg compared to the left with a large right heel ulcer  Past Medical History:  Diagnosis Date  . Arthritis   . Back pain    occasionally  . BPH (benign prostatic hypertrophy)    takes Uroxatral daily as well as Finasteride   . Diabetes mellitus without complication ([place])    takes Metformin daily  . Diabetic foot infection ([place]) 10/22/2019  . GERD (gastroesophageal reflux disease)    no meds  . Hepatitis hx of   at age 66  . Hypertension    takes Losartan and HCTZ daily  . Joint pain   . Lymphedema 10/22/2019  . Urinary frequency   . Urinary urgency   . Venous stasis dermatitis 11/19/2019   Past Surgical History:  Procedure Laterality Date  . APPENDECTOMY     age 85  . COLONOSCOPY    . KNEE ARTHROSCOPY  1984   right and left   . SHOULDER ARTHROSCOPY  2003   right RCR-GSC-stayed overnight-had SOB  . SHOULDER ARTHROSCOPY WITH ROTATOR CUFF REPAIR AND SUBACROMIAL DECOMPRESSION Left 03/13/2013   Procedure: LEFT SHOULDER ARTHROSCOPY WITH SUBACROMIAL DECOMPRESSION, DISTAL CLAVICLE RESECTION AND REPAIR ROTATOR CUFF AND LABRAL DEBRIDEMENT;  Surgeon: [name]., MD;  Location: [facility];  Service: Orthopedics;  Laterality: Left;  . TOTAL HIP ARTHROPLASTY Right 01/29/2015   Procedure: TOTAL HIP ARTHROPLASTY ANTERIOR APPROACH;  Surgeon: [name], MD;  Location: [facility];  Service: Orthopedics;  Laterality: Right;  Marland Kitchen VASECTOMY    . [place]   Social History   Socioeconomic History  . Marital status: Married    Spouse name: Not on file  . Number of children: 3  . Years of education: Not on file  . Highest education level: Not on file    Occupational History  . Not on file  Tobacco Use  . Smoking status: Never Smoker  . Smokeless tobacco: Former Systems developer    Types: Secondary school teacher  . Vaping Use: Never used  Substance and Sexual Activity  . Alcohol use: Yes    Alcohol/week: 0.0 standard drinks    Comment: occasionally beer or scotch  . Drug use: No  . Sexual activity: Yes  Other Topics Concern  . Not on file  Social History Narrative  . Not on file   Social Determinants of Health   Financial Resource Strain:   . Difficulty of Paying Living Expenses: Not on file  Food Insecurity:   . Worried About Charity fundraiser in the Last Year: Not on file  . Ran Out of Food in the Last Year: Not on file  Transportation Needs:   . Lack of Transportation (Medical): Not on file  . Lack of Transportation (Non-Medical): Not on file  Physical Activity:   . Days of Exercise per Week: Not on file  . Minutes of Exercise per Session: Not on file  Stress:   . Feeling of Stress : Not on file  Social Connections:   . Frequency of Communication with Friends and Family: Not on file  . Frequency of Social Gatherings with Friends and Family: Not on file  . Attends Religious Services: Not on file  . Active Member of  Clubs or Organizations: Not on file  . Attends Archivist Meetings: Not on file  . Marital Status: Not on file   Family History  Problem Relation Age of Onset  . Benign prostatic hyperplasia Father   . Kidney cancer Neg Hx   . Kidney disease Neg Hx    - negative except otherwise stated in the family history section Allergies  Allergen Reactions  . Codeine Swelling    Water retention  . Sulfur Other (See Comments)    Not sure of reaction a young child   Prior to Admission medications   Medication Sig Start Date End Date Taking? Authorizing Provider  apixaban (ELIQUIS) 5 MG TABS tablet Take 1 tablet (5 mg total) by mouth 2 (two) times daily. Patient not taking: Reported on 09/08/2020 02/06/20 03/07/20   Marianna Payment, MD  metoprolol tartrate (LOPRESSOR) 50 MG tablet Take 1 tablet (50 mg total) by mouth 2 (two) times daily for 30 doses. Patient not taking: Reported on 09/08/2020 02/06/20 02/21/20  Marianna Payment, MD  potassium chloride SA (KLOR-CON) 20 MEQ tablet Take 1 tablet by mouth daily Patient not taking: Reported on 09/08/2020 09/18/19   Richardson Dopp T, PA-C  torsemide (DEMADEX) 20 MG tablet Take 2 tablets (40 mg total) by mouth 2 (two) times daily. Patient not taking: Reported on 09/08/2020 02/06/20 03/07/20  Marianna Payment, MD   DG Foot Complete Right  Result Date: 09/08/2020 CLINICAL DATA:  Bone deep wound on right foot, extensive gangrene in both legs EXAM: RIGHT FOOT COMPLETE - 3+ VIEW COMPARISON:  None. FINDINGS: Extensive soft tissue swelling/thickening of the lower extremity. Reported site of ulceration is difficult to visualize radiographically. Suspect some ulceration along the plantar aspect of the calcaneus and involving the digits. Extensive radiodense foreign bodies projecting over digits appear external to the soft tissues. Some additional mineralization is noted along the plantar surface as well. There is a likely fracture dislocation of the fifth proximal interphalangeal joint with dorsal-lateral subluxation. Additional clawtoe deformities of the second through fourth rays. Marked subcortical lucency of the head of the fifth and to a lesser extent fourth metatarsals raise suspicion for potential osteomyelitis. Correlate for sites of ulceration and clinical findings. Atherosclerotic calcification of the soft tissues. IMPRESSION: 1. Extensive soft tissue swelling/thickening of the lower extremity. 2. Suspect ulceration along the plantar aspect of the calcaneus and involving the digits/head of the metatarsals. 3. Subcortical lucency particularly involving the head of the fifth and to a lesser extent fourth metatarsals raises suspicion for possible osteomyelitis. 4. Likely fracture  dislocation of the fifth proximal interphalangeal joint with dorsal-lateral subluxation. 5. Extensive radiodense foreign bodies projecting along the plantar skin surface and about the digits, correlate with visual inspection. Electronically Signed   By: Lovena Le M.D.   On: 09/08/2020 16:26   - pertinent xrays, CT, MRI studies were reviewed and independently interpreted  Positive ROS: All other systems have been reviewed and were otherwise negative with the exception of those mentioned in the HPI and as above.  Physical Exam: General: Alert, no acute distress Psychiatric: Patient is competent for consent with normal mood and affect Lymphatic: No axillary or cervical lymphadenopathy Cardiovascular: No pedal edema Respiratory: No cyanosis, no use of accessory musculature GI: No organomegaly, abdomen is soft and non-tender    Images:  @ENCIMAGES @  Labs:  Lab Results  Component Value Date   HGBA1C 9.6 (H) 09/08/2020   HGBA1C 7.3 (H) 01/31/2020   ESRSEDRATE 55 (H) 09/08/2020   ESRSEDRATE 22 (  H) 10/22/2019   CRP 8.2 (H) 09/08/2020   CRP 3.7 10/22/2019   REPTSTATUS 02/05/2020 FINAL 01/31/2020   GRAMSTAIN  01/24/2020    NO WBC SEEN ABUNDANT GRAM NEGATIVE RODS FEW GRAM POSITIVE COCCI    CULT NO GROWTH 5 DAYS 01/31/2020   LABORGA PSEUDOMONAS AERUGINOSA 01/24/2020    Lab Results  Component Value Date   ALBUMIN 2.8 (L) 09/08/2020   ALBUMIN 3.1 (L) 02/05/2020   ALBUMIN 3.0 (L) 01/31/2020   PREALBUMIN 11.1 (L) 09/08/2020    Neurologic: Patient does not have protective sensation bilateral lower extremities.   MUSCULOSKELETAL:   Skin: Examination patient has a large ulcer over the right heel, 6 cm in diameter 2 mm deep with no exposed bone with granulation tissue, he has a necrotic ulcer beneath the fifth metatarsal head of the right foot with exposed bone.  Patient has massive venous and lymphatic swelling worse on the right than the left lower extremity with pitting edema to  the tibial tubercle.  Patient has animal hairs and feces caked on his feet.  Patient's last ankle-brachial indices in November 2020 showed triphasic flow.  I cannot palpate a pulse due to the swelling.  Review of the radiographs show bony changes of the fifth metatarsal head consistent with osteomyelitis.  Albumin 2.8 hemoglobin A1c 9.6  Assessment: Assessment diabetic insensate neuropathy with venous and lymphatic swelling both lower extremities right worse than left with a large right heel ulcer and a necrotic ulcer beneath the fifth metatarsal head on the right with osteomyelitis of the fifth metatarsal head and animal feces and animal hair caked to his foot.  Plan: Plan: Patient will need his extremities cleansed, he will need compression therapy for the massive swelling.  PRAFO boot on the right.  And anticipate with pressure unloading the right heel ulcer may resolve however patient would require 5th ray amputation on the right.  Patient states he will not consider surgical intervention at this time.  Thank you for the consult and the opportunity to see Mr. Hason Ofarrell, [place] [phone] 6:40 AM

## 2020-09-09 NOTE — Progress Notes (Signed)
Orthopedic Tech Progress Note Patient Details:  [name]. 09-13-1951 [id number]  Ortho Devices Type of Ortho Device: Prafo boot/shoe Ortho Device/Splint Location: right Ortho Device/Splint Interventions: Application   Post Interventions Patient Tolerated: Well Instructions Provided: Care of device   [name] 09/09/2020, 11:50 AM

## 2020-09-09 NOTE — Plan of Care (Signed)

## 2020-09-10 ENCOUNTER — Inpatient Hospital Stay (HOSPITAL_COMMUNITY): Payer: Medicare Other

## 2020-09-10 DIAGNOSIS — L0390 Cellulitis, unspecified: Secondary | ICD-10-CM | POA: Diagnosis not present

## 2020-09-10 DIAGNOSIS — I96 Gangrene, not elsewhere classified: Secondary | ICD-10-CM

## 2020-09-10 DIAGNOSIS — M869 Osteomyelitis, unspecified: Secondary | ICD-10-CM | POA: Diagnosis not present

## 2020-09-10 LAB — HEPARIN LEVEL (UNFRACTIONATED)
Heparin Unfractionated: 0.33 IU/mL (ref 0.30–0.70)
Heparin Unfractionated: 0.29 IU/mL — ABNORMAL LOW (ref 0.30–0.70)

## 2020-09-10 LAB — CBC
HCT: 35.8 % — ABNORMAL LOW (ref 39.0–52.0)
Hemoglobin: 11.5 g/dL — ABNORMAL LOW (ref 13.0–17.0)
MCH: 27.6 pg (ref 26.0–34.0)
MCHC: 32.1 g/dL (ref 30.0–36.0)
MCV: 85.9 fL (ref 80.0–100.0)
Platelets: 238 10*3/uL (ref 150–400)
RBC: 4.17 MIL/uL — ABNORMAL LOW (ref 4.22–5.81)
RDW: 14.3 % (ref 11.5–15.5)
WBC: 15.7 10*3/uL — ABNORMAL HIGH (ref 4.0–10.5)
nRBC: 0 % (ref 0.0–0.2)

## 2020-09-10 LAB — COMPREHENSIVE METABOLIC PANEL
ALT: 16 U/L (ref 0–44)
AST: 20 U/L (ref 15–41)
Albumin: 2.4 g/dL — ABNORMAL LOW (ref 3.5–5.0)
Alkaline Phosphatase: 72 U/L (ref 38–126)
Anion gap: 14 (ref 5–15)
BUN: 49 mg/dL — ABNORMAL HIGH (ref 8–23)
CO2: 19 mmol/L — ABNORMAL LOW (ref 22–32)
Calcium: 7.7 mg/dL — ABNORMAL LOW (ref 8.9–10.3)
Chloride: 100 mmol/L (ref 98–111)
Creatinine, Ser: 2.77 mg/dL — ABNORMAL HIGH (ref 0.61–1.24)
GFR, Estimated: 24 mL/min — ABNORMAL LOW (ref >60)
Glucose, Bld: 144 mg/dL — ABNORMAL HIGH (ref 70–99)
Potassium: 4.1 mmol/L (ref 3.5–5.1)
Sodium: 133 mmol/L — ABNORMAL LOW (ref 135–145)
Total Bilirubin: 0.7 mg/dL (ref 0.3–1.2)
Total Protein: 7.1 g/dL (ref 6.5–8.1)

## 2020-09-10 LAB — GLUCOSE, CAPILLARY
Glucose-Capillary: 125 mg/dL — ABNORMAL HIGH (ref 70–99)
Glucose-Capillary: 163 mg/dL — ABNORMAL HIGH (ref 70–99)
Glucose-Capillary: 175 mg/dL — ABNORMAL HIGH (ref 70–99)
Glucose-Capillary: 233 mg/dL — ABNORMAL HIGH (ref 70–99)

## 2020-09-10 LAB — MAGNESIUM: Magnesium: 1.6 mg/dL — ABNORMAL LOW (ref 1.7–2.4)

## 2020-09-10 MED ORDER — PROSOURCE PLUS PO LIQD
30.0000 mL | Freq: Four times a day (QID) | ORAL | Status: DC
  Administered 2020-09-10 – 2020-09-29 (×68): 30 mL via ORAL
  Filled 2020-09-10 (×66): qty 30

## 2020-09-10 MED ORDER — HEPARIN (PORCINE) 25000 UT/250ML-% IV SOLN
2300.0000 [IU]/h | INTRAVENOUS | Status: DC
  Administered 2020-09-10: 2300 [IU]/h via INTRAVENOUS
  Filled 2020-09-10: qty 250

## 2020-09-10 MED ORDER — ADULT MULTIVITAMIN W/MINERALS CH
1.0000 | ORAL_TABLET | Freq: Every day | ORAL | Status: DC
  Administered 2020-09-10 – 2020-09-17 (×8): 1 via ORAL
  Filled 2020-09-10 (×8): qty 1

## 2020-09-10 MED ORDER — SODIUM CHLORIDE 0.9 % IV SOLN
740.0000 mg | Freq: Every day | INTRAVENOUS | Status: DC
  Administered 2020-09-10 – 2020-09-11 (×2): 740 mg via INTRAVENOUS
  Filled 2020-09-10 (×2): qty 14.8

## 2020-09-10 MED ORDER — METRONIDAZOLE IN NACL 5-0.79 MG/ML-% IV SOLN
500.0000 mg | Freq: Three times a day (TID) | INTRAVENOUS | Status: DC
  Administered 2020-09-10 – 2020-09-12 (×6): 500 mg via INTRAVENOUS
  Filled 2020-09-10 (×6): qty 100

## 2020-09-10 MED ORDER — VANCOMYCIN VARIABLE DOSE PER UNSTABLE RENAL FUNCTION (PHARMACIST DOSING): Status: DC

## 2020-09-10 MED ORDER — APIXABAN 5 MG PO TABS
5.0000 mg | ORAL_TABLET | Freq: Two times a day (BID) | ORAL | Status: DC
  Administered 2020-09-10 – 2020-09-15 (×10): 5 mg via ORAL
  Filled 2020-09-10 (×2): qty 2
  Filled 2020-09-10 (×9): qty 1

## 2020-09-10 MED ORDER — LIVING WELL WITH DIABETES BOOK
Freq: Once | Status: AC
  Filled 2020-09-10: qty 1

## 2020-09-10 NOTE — Assessment & Plan Note (Addendum)
-   has been mildly hypotensive/low normal without any medication - continue monitoring BP - lasix now on hold

## 2020-09-10 NOTE — Progress Notes (Signed)
ANTICOAGULATION CONSULT NOTE - Follow Up Consult  Pharmacy Consult for Heparin Indication: History of Afib  Allergies  Allergen Reactions   Codeine Swelling    Water retention   Sulfur Other (See Comments)    Not sure of reaction a young child   Patient Measurements: Height: 5\' 9"  (175.3 cm) Weight: 124.7 kg (275 lb) IBW/kg (Calculated) : 70.7 Heparin Dosing Weight: 99 kg  Vital Signs: Temp: 97.7 F (36.5 C) (10/27 0644) Temp Source: Oral (10/26 2104) BP: 107/65 (10/27 0644) Pulse Rate: 94 (10/27 0644)  Labs: Recent Labs    09/08/20 1904 09/08/20 1904 09/08/20 2103 09/08/20 2103 09/09/20 0722 09/09/20 0726 09/09/20 0739 09/09/20 1733 09/10/20 0236 09/10/20 0450  HGB 12.4*   < >  --   --   --  11.7*  --   --   --  11.5*  HCT 38.7*  --   --   --   --  37.5*  --   --   --  35.8*  PLT 280  --   --   --   --  238  --   --   --  238  APTT  --   --  44*  --   --   --   --   --   --   --   HEPARINUNFRC  --   --  <0.10*   < >  --   --  <0.10* 0.15* 0.33  --   CREATININE 1.96*  --   --   --  2.19*  --   --   --   --  2.77*   < > = values in this interval not displayed.   Estimated Creatinine Clearance: 31.9 mL/min (A) (by C-G formula based on SCr of 2.77 mg/dL (H)).  Medications:  Infusions:   ceFEPime (MAXIPIME) IV 2 g (09/09/20 2227)   heparin 2,100 Units/hr (09/09/20 2335)   vancomycin 1,500 mg (09/09/20 1826)   Assessment: 78 yoM admitted on 10/25 with necrotic diabetic foot infection, orthopedic surgery is consulted for management.  PMH is significant for Afib and was prescribed apixaban, but was NOT taking any medications at home.  Baseline coags confirm no recent DOAC use as anti-Xa level is undetectable.   Pharmacy is consulted to dose Heparin.    09/10/2020: Heparin level 0.29, just below therapeutic on heparin 2100 units/hr CBC: Hgb decreased to 11.5, Plt remain WNL. No bleeding or infusion related issues reported  Goal of Therapy:  Heparin level  0.3-0.7 units/ml Monitor platelets by anticoagulation protocol: Yes   Plan:   Increase to Heparin infusion at 2300 units/hr  Recheck heparin level in 8 hours after rate change  Daily heparin level and CBC  Continue to monitor H&H and platelets  Gretta Arab PharmD, BCPS Clinical Pharmacist [facility] main pharmacy [phone] 09/10/2020 8:37 AM

## 2020-09-10 NOTE — Hospital Course (Addendum)
[name] is a 69 y.o. male with medical history significant of DM2, dCHF, PAF (supposed to be on Eliquis), HTN who presented to ED after being found down in the bathroom during welfare check.   Per report, patient noted to have an unsanitary living condition and is currently followed by adult protective services. Patient reports falling while standing up from bathroom without LOC, resulting in fall onto R chest and resultant R lower chest pain. Patient was found down and brought into ED.    Of note, patient reports stopping all medications several months ago with exception of torsemide because "I didn't think I needed them anymore." Pt has only been taking torsemide PRN swelling.  He was found to have necrotic foot infections and underwent xray of right foot, which revealed subcortical lucency involving 5th digit concerning for OM.  He was started on vancomycin and cefepime and orthopedic surgery was consulted. ABIs were also ordered.  Test was inconclusive due to severe lower extremity edema.   Initially patient was also against surgical treatment options despite severity of his wounds, their obvious chronicity, and inability to explain consequences for avoiding surgery. We requested psychiatry to evaluate patient for decision making ability in this setting. After repeat explanation of his diagnosis and possible treatment options he was able to voice adequate understanding to psychiatry and was considered to have decision making capacity regarding his current infection.   He will need to continue with serial compression wrappings at discharge in efforts to promote better chance of wound healing and proper assessment of ABIs to help decide best treatment course.   Given his significant deconditioning and after discussion with his daughter, there is concern for his ability to safely return home due to poor living situation; Physical therapy was consulted. After PT evaluation he was recommended for  SNF at discharge.  He was accepting of this and placement was pursued.  Overall, he had good clinical response to IV antibiotics.  He was deescalated to Augmentin and doxycycline to finish out a course. He will continue with serial compression wrappings at discharge to rehab as well as following up with Ortho for further management.  During hospitalization he was started on IV Lasix for diuresis given his severe lower extremity edema.  His creatinine continued to slowly uptrend.  Urine output measuring was inaccurate and difficult to gauge response. Nephrology was consulted on 09/14/2020 given his uptrending creatinine in setting of multiple antibiotics initially and some transient hypotension.

## 2020-09-10 NOTE — Assessment & Plan Note (Addendum)
-  probable OM per xray; ABIs indeterminate due to LE edema - patient against inpatient surgery at this time regardless - patient needs serial compression wrappings to LE to aid with improving edema - CRP 8.2, ESR 55. Will serve as baseline going forward  -Outpatient follow-up with orthopedic surgery -Discontinue IV antibiotics.  Start Augmentin and doxycycline.  will continue for 2-week course

## 2020-09-10 NOTE — Progress Notes (Signed)
ABI completed. Refer to "CV Proc" under chart review to view preliminary results.  09/10/2020 9:37 AM Kelby Aline., MHA, RVT, RDCS, RDMS

## 2020-09-10 NOTE — Assessment & Plan Note (Addendum)
-   RVR considered in setting of sepsis on admission - responded to BB (patient noncompliant at home) - started on heparin drip on admission; given no upcoming surgery at this point, d/c heparin drip and resume Eliquis (will need to counsel patient on compliance at d/c once again) - now with need for possible HD and vascular access, will d/c Eliquis once again and place back on heparin drip for now  - resume home BB and adjust as necessary

## 2020-09-10 NOTE — Consult Note (Signed)
[name] is a 69 year old poorly controlled diabetes who presented to the emergency room due to right foot wound.  There was an initial concern regarding sepsis, patient was admitted to the floor for evaluation of necrotic foot wound and osteomyelitis.  Clinical exam demonstrates significant swelling in her lower extremities, decreased sensation, significant ecchymosis, erythema and edema from mid calf down.  Vascular status has been obtained through ABI which was limited due to current clinical presentation of drainage, wounds, and gangrene.  Patient has been ultimately diagnosed with advanced bilateral lower extremity cellulitis and nonhealing wound ulcers, he was admitted to medical service for IV antibiotics and surgery.  Psychiatric consult was placed for capacity evaluation as patient has declined surgical interventions at this time.  [name] is a very pleasant 69 year old male with poorly controlled diabetes, who was diagnosed with osteomyelitis of the right fifth toe.  It has been recommended the patient receive surgical debridement and amputation, however he has declined surgical interventions at this time.  During initial evaluation patient originally reported " I did not decline any surgery.  However did not decline and I was not aware that are needed surgery."  Patient states he was not aware able to make an informed decision as he had not been notified of his treatment options.  It was at this time Probation officer contacted referring provider Dr. Thressa Sheller, who was able to join me at the bedside.  We were able to provide a chart review, photographs, and address all questions and concerns at that time.   Mental Capacity Assessment: I have evaluated the following areas to assess the Keith Haven Jr.'s mental capacity regarding medical decision-making ability which pertains to competency to accept or refuse medical treatment.   The specific treatment or service in question is: refusing surgical  interventions.  Communication: The patient was able to clearly state preferred treatment options for his medical care at this time. The patient was able to decide that he would like additional time to decide on surgery.  He would like to continue to receive medical treatment to include IV antibiotics to prevent the spread of infection.  He is also requested receiving wound care.  Factors that could compromise this communication process includes sepsis, hypothermia, and increased lactic acidosis.    Understanding: The patient was able to recall information, link causal relationships, and process general probabilities regarding life situations and medical treatment scenarios . He was able to paraphrase his view of the current situation and his thoughts about it including exhausting all medical options prior to invasive options.  He did not present with any impairment in memory, attention span, and or intelligence.  He was also able to properly identify the animal hair located in his wound was a result of 2 pets at home.  He states his right toe has always been a problem, and is no different from the others.  Appreciation: The patient was able to identify and describe her various illnesses and treatment options with potential outcomes. The patient did not present with concerns such as denial or delusional thought-process. " I am confused as to why I am talking to you since I dont have anything wrong. They once mentioned P S something that I do not have. There is nothing wrong with me. "    Rationalization: The patient was able to weigh risks and benefits and come to a conclusion congruent with patient's perceived goals. Concerns regarding this category are: depression, elevated inflammatory markers, electrolyte imbalances,  In conclusion,  the patient is-not experiencing an acute medical scenario: That would prevent him from make an appropriate decision making.   Conclusion: At this time, there is insufficient  evidence to warrant removal of the patient's rights for medical decision-making.  He can clearly determine mental capacity for decision-making due to presence of osteomyelitis in the setting of poorly controlled diabetes, and multiple infected wounds of the lower extremities.  At this time, we can determine that the patient does have functional mental capacity for medical decision-making including the right to accept or refuse surgery.    A long discussion was had with patient regarding his medical complaints and he is prognosis if he chooses not to engage in surgery for removal of his toe.  Patient expresses an understanding of both his medical condition and the proposed plan.  Patient is able to express a choice to proceed with all medical treatment options prior to surgical intervention.  He does inform his primary medical team he would like additional time to decide on surgery.  He remains oppositional about surgery at this time, but appears to have some improved insight.  Patient has an appreciation of the fact that he may be harmed if he refuses surgery, to include needing amputation of lower extremity and or sepsis.    Patient is able to reason with writer and explain why he has made his decision the way he has made it.  For these reasons writer feels that patient has capacity at this point in time to participate in treatment

## 2020-09-10 NOTE — Progress Notes (Signed)
ANTICOAGULATION CONSULT NOTE - Follow Up Consult  Pharmacy Consult for Heparin Indication: History of Afib  Allergies  Allergen Reactions  . Codeine Swelling    Water retention  . Sulfur Other (See Comments)    Not sure of reaction a young child   Patient Measurements: Height: 5\' 9"  (175.3 cm) Weight: 124.7 kg (275 lb) IBW/kg (Calculated) : 70.7 Heparin Dosing Weight: 99 kg  Vital Signs: Temp: 97.7 F (36.5 C) (10/26 2104) Temp Source: Oral (10/26 2104) BP: 119/78 (10/26 2104) Pulse Rate: 66 (10/26 2104)  Labs: Recent Labs    09/08/20 1446 09/08/20 1446 09/08/20 1904 09/08/20 2103 09/08/20 2103 09/09/20 0722 09/09/20 0726 09/09/20 0739 09/09/20 1733 09/10/20 0236  HGB 13.1   < > 12.4*  --   --   --  11.7*  --   --   --   HCT 41.7  --  38.7*  --   --   --  37.5*  --   --   --   PLT 299  --  280  --   --   --  238  --   --   --   APTT  --   --   --  44*  --   --   --   --   --   --   HEPARINUNFRC  --   --   --  <0.10*   < >  --   --  <0.10* 0.15* 0.33  CREATININE 1.98*  --  1.96*  --   --  2.19*  --   --   --   --    < > = values in this interval not displayed.   Estimated Creatinine Clearance: 40.4 mL/min (A) (by C-G formula based on SCr of 2.19 mg/dL (H)).  Medications:  Infusions:  . ceFEPime (MAXIPIME) IV 2 g (09/09/20 2227)  . heparin 2,100 Units/hr (09/09/20 2335)  . vancomycin 1,500 mg (09/09/20 1826)   Assessment: 21 yoM admitted on 10/25 with necrotic diabetic foot infection, orthopedic surgery is consulted for management.  PMH is significant for Afib and was prescribed apixaban, but was NOT taking any medications at home.  Baseline coags confirm no recent DOAC use as anti-Xa level is undetectable.   Pharmacy is consulted to dose Heparin.    09/10/2020: Heparin level 0.33- therapeutic on heparin 2100 units/hr No bleeding or infusion related issues reported by RN  Goal of Therapy:  Heparin level 0.3-0.7 units/ml Monitor platelets by  anticoagulation protocol: Yes   Plan:   Continue Heparin infusion at 2100 units/hr  Check Heparin level at 10a to confirm therapeutic at steady-state  Daily heparin level and CBC  Continue to monitor H&H and platelets  Netta Cedars, PharmD, [place] [phone] 09/10/2020, 3:11 AM

## 2020-09-10 NOTE — Assessment & Plan Note (Addendum)
-   A1c 9.6% - continue SSI and CBG for now - patient also noncompliant with regimen at home; states "I didn't think I need it", he is now amenable to restarting medications as prescribed

## 2020-09-10 NOTE — Assessment & Plan Note (Addendum)
Most recent 2d echo from 3/21 with EF of 40-45%, unable to evaluate diastolic dysfunction at that time - lasix now on hold due to renal function - see AoCKD

## 2020-09-10 NOTE — Assessment & Plan Note (Addendum)
-   baseline creatinine approx 1.2 - 1.3 - creatinine 1.98 on admission which has uptrended (now up to 4.7 on 11/1) and BUN rising as well. Appears to be acting like ATN at this time (multifactorial cause possibly some relative hypotension, additive effect of abx, intravascular volume depletion?) - continue trending creat to peak and hopeful for diuretic phase following; otherwise may approach need for HD soon in next 1-2 days; patient is aware of renal function and would want HD if needed he says - appreciate nephrology assistance - continue daily weights and strict I&O

## 2020-09-10 NOTE — Progress Notes (Signed)
Initial Nutrition Assessment  DOCUMENTATION CODES:   Obesity unspecified  INTERVENTION:  - will order 30 ml Prosource Plus QID, each supplement provides 100 kcal and 15 grams protein.  - will order 1 tablet multivitamin with minerals/day. - recommend outpatient DM diet-related education.   NUTRITION DIAGNOSIS:   Increased nutrient needs related to wound healing, acute illness as evidenced by estimated needs.  GOAL:   Patient will meet greater than or equal to 90% of their needs  MONITOR:   PO intake, Supplement acceptance, Labs, Weight trends, I & O's  REASON FOR ASSESSMENT:   Malnutrition Screening Tool, Consult Wound healing  ASSESSMENT:   69 y.o. male with medical history significant of type 2 DM, CHF, afib on eliquis, and HTN. He presented to the ED after being found down in the bathroom during a welfare check. Patient was noted to have unsanitary living condition and is currently followed by APS. He reported falling upon standing without LOC. He fell on his R chest which resulted in R lower chest pain. He reported to ED staff that he stopped taking all of his medications several months ago (except furosemide) because "I didn't think I needed them anymore."  Unable to see patient x1 attempt this AM and x1 attempt this afternoon. The only documented meal completion was lunch yesterday when he ate 100% (650 kcal, 23 grams protein).   Weight yesterday was 275 lb and PTA the most recently documented weight was on 02/06/20 when he weighed 282 lb. This indicates 7 lb weight loss (2.5% body weight) since March, but it is documented that he currently has deep pitting edema to BLE. Will monitor weight trends closely.  Psychiatry saw patient today to assess competency and ability to understand current medical course and make decisions.    WOC is following patient due to wounds.   At this time, patient is declining surgical I&D to 5th R toe.    Labs reviewed; CBGs: 125 and 163  mg/dl, Na; 133 mmol/l, BUN: 49 mg/dl, creatinine: 2.77 mg/dl, Ca: 7.7 mg/dl, Mg: 1.6 mg/dl, GFR: 24 ml/min.  Medications reviewed; 40 mg IV lasix/day, sliding scale novolog.    NUTRITION - FOCUSED PHYSICAL EXAM:  unable to complete at this time.  Diet Order:   Diet Order            Diet Carb Modified Fluid consistency: Thin; Room service appropriate? Yes  Diet effective now                 EDUCATION NEEDS:   Not appropriate for education at this time  Skin:  Skin Assessment: Skin Integrity Issues: Skin Integrity Issues:: Unstageable, Other (Comment) Unstageable: full thickness to R anterior foot; Right plantar heel Other: partial thickness wound to R anterior calf  Last BM:  10/26 (type 6 x3)  Height:   Ht Readings from Last 1 Encounters:  09/08/20 5\' 9"  (1.753 m)    Weight:   Wt Readings from Last 1 Encounters:  09/08/20 124.7 kg     Estimated Nutritional Needs:  Kcal:  1870-2120 kcal Protein:  105-115 grams Fluid:  >/= 2.1 L/day     Jarome Matin, MS, RD, LDN, CNSC Inpatient Clinical Dietitian RD pager # available in [facility]  After hours/weekend pager # available in [facility]

## 2020-09-10 NOTE — Progress Notes (Signed)
PROGRESS NOTE    [place].   EXH:[id number]  DOB: 1951-06-07  DOA: 09/08/2020     2  PCP: Aura Dials, MD  CC: found down at home  Hospital Course: [name] is a 69 y.o. male with medical history significant of DM2, dCHF, PAF (supposed to be on Eliquis), HTN who presented to ED after being found down in the bathroom during welfare check.   Per report, [name] noted to have an unsanitary living condition and is currently followed by adult protective services. [name] reports falling while standing up from bathroom without LOC, resulting in fall onto R chest and resultant R lower chest pain. [name] was found down and brought into ED.    Of note, [name] reports stopping all medications several months ago with exception of torsemide because "I didn't think I needed them anymore." Pt has only been taking torsemide PRN swelling.  He was found to have necrotic foot infections and underwent xray of right foot, which revealed subcortical lucency involving 5th digit concerning for OM.  He was started on vancomycin and cefepime and orthopedic surgery was consulted. ABIs were also ordered.  Test was inconclusive due to severe lower extremity edema.   Initially [name] was also against surgical treatment options despite severity of his wounds, their obvious chronicity, and inability to explain consequences for avoiding surgery. We requested psychiatry to evaluate [name] for decision making ability in this setting. After repeat explanation of his diagnosis and possible treatment options he was able to voice adequate understanding to psychiatry and was considered to have decision making capacity regarding his current infection.   He will need to continue with serial compression wrappings at discharge in efforts to promote better chance of wound healing and proper assessment of ABIs to help decide best treatment course.    Interval History:  I was present in room this morning during  psych eval for capacity evaluation. He seems to better understand risks/benefits of surgery, however large component of denial at play with how sick and neglected he has treated his body. He doesn't want to lose a toe, yet I told him he may end up losing much more if he postpones and neglects his body further in the future. He continuously downplays the severity of his foot and leg wounds when asked.   Old records reviewed in assessment of this [name]  ROS: Constitutional: negative for chills and fevers, Respiratory: negative for cough, Cardiovascular: negative for chest pain and Gastrointestinal: negative for abdominal pain  Assessment & Plan: * Osteomyelitis of fifth toe of right foot ([place]) - probable OM per xray; ABIs indeterminate due to LE edema - [name] against inpatient surgery at this time regardless - tentative plan is for IV abx for next ~24 hours then will d/c home on oral abx course with outpatient follow up with ortho - [name] needs serial compression wrappings to LE to aid with improving edema - CRP 8.2, ESR 55. Will serve as baseline going forward   Sepsis ([place]) -Tachycardic, tachypneic, elevated WBC, elevated lactic acid.  Source considered right foot -See osteomyelitis above  Atrial fibrillation with RVR (HCC) - RVR considered in setting of sepsis on admission - responded to BB ([name] noncompliant at home) - started on heparin drip on admission; given no upcoming surgery at this point, d/c heparin drip and resume Eliquis (will need to counsel [name] on compliance at d/c once again)  ARF (acute renal failure) ([place]) - monitor renal function on diuresis   Acute  on chronic heart failure with preserved ejection fraction (HFpEF) ([place]) Most recent 2d echo from 3/21 with EF of 40-45%, unable to evaluate diastolic dysfunction at that time - continue lasix; albumin 2.4  Essential hypertension - continue current regimen   Type 2 diabetes mellitus (HCC) - A1c 9.6% -  continue SSI and CBG for now - [name] also noncompliant with regimen at home; states "I didn't think I need it"    Antimicrobials: Vanc 10/25>>10/27 Zosyn 10/25 x 1 Cefepime 10/25>>present Daptomycin 10/27>>present Flagyl 10/27>>present    DVT prophylaxis: Eliquis Code Status: Full Family Communication: none present Disposition Plan: Status is: Inpatient  Remains inpatient appropriate because:Unsafe d/c plan, IV treatments appropriate due to intensity of illness or inability to take PO and Inpatient level of care appropriate due to severity of illness   Dispo: The [name] is from: Home              Anticipated d/c is to: pending PT eval              Anticipated d/c date is: 2 days              [name] currently is not medically stable to d/c.  Objective: Blood pressure 107/65, pulse 94, temperature 97.7 F (36.5 C), resp. rate 18, height _0  (1.753 m), weight 124.7 kg, SpO2 95 %.  Examination: General appearance: unkempt elderly man laying in bed in NAD Head: Normocephalic, without obvious abnormality, atraumatic Eyes: EOMI Lungs: clear to auscultation bilaterally Heart: regular rate and rhythm and S1, S2 normal Abdomen: obese, soft, NT, ND, BS present Extremities: B/L LE edema noted. Feet are noted with severe necrotic lesions ulcers on plantar surfaces. Right foot noted with large heel ulcer and ball of foot ulcer Skin: mobility and turgor normal Neurologic: Grossly normal  Consultants:   Ortho  Procedures:   none  Data Reviewed: I have personally reviewed following labs and imaging studies Results for orders placed or performed during the hospital encounter of 09/08/20 (from the past 24 hour(s))  Glucose, capillary     Status: Abnormal   Collection Time: 09/09/20  5:00 PM  Result Value Ref Range   Glucose-Capillary 196 (H) 70 - 99 mg/dL  Heparin level (unfractionated)     Status: Abnormal   Collection Time: 09/09/20  5:33 PM  Result Value Ref Range    Heparin Unfractionated 0.15 (L) 0.30 - 0.70 IU/mL  Glucose, capillary     Status: Abnormal   Collection Time: 09/09/20  9:23 PM  Result Value Ref Range   Glucose-Capillary 134 (H) 70 - 99 mg/dL  Heparin level (unfractionated)     Status: None   Collection Time: 09/10/20  2:36 AM  Result Value Ref Range   Heparin Unfractionated 0.33 0.30 - 0.70 IU/mL  CBC     Status: Abnormal   Collection Time: 09/10/20  4:50 AM  Result Value Ref Range   WBC 15.7 (H) 4.0 - 10.5 K/uL   RBC 4.17 (L) 4.22 - 5.81 MIL/uL   Hemoglobin 11.5 (L) 13.0 - 17.0 g/dL   HCT 35.8 (L) 39 - 52 %   MCV 85.9 80.0 - 100.0 fL   MCH 27.6 26.0 - 34.0 pg   MCHC 32.1 30.0 - 36.0 g/dL   RDW 14.3 11.5 - 15.5 %   Platelets 238 150 - 400 K/uL   nRBC 0.0 0.0 - 0.2 %  Comprehensive metabolic panel     Status: Abnormal   Collection Time: 09/10/20  4:50 AM  Result Value Ref Range   Sodium 133 (L) 135 - 145 mmol/L   Potassium 4.1 3.5 - 5.1 mmol/L   Chloride 100 98 - 111 mmol/L   CO2 19 (L) 22 - 32 mmol/L   Glucose, Bld 144 (H) 70 - 99 mg/dL   BUN 49 (H) 8 - 23 mg/dL   Creatinine, Ser 2.77 (H) 0.61 - 1.24 mg/dL   Calcium 7.7 (L) 8.9 - 10.3 mg/dL   Total Protein 7.1 6.5 - 8.1 g/dL   Albumin 2.4 (L) 3.5 - 5.0 g/dL   AST 20 15 - 41 U/L   ALT 16 0 - 44 U/L   Alkaline Phosphatase 72 38 - 126 U/L   Total Bilirubin 0.7 0.3 - 1.2 mg/dL   GFR, Estimated 24 (L) >60 mL/min   Anion gap 14 5 - 15  Magnesium     Status: Abnormal   Collection Time: 09/10/20  4:50 AM  Result Value Ref Range   Magnesium 1.6 (L) 1.7 - 2.4 mg/dL  Glucose, capillary     Status: Abnormal   Collection Time: 09/10/20  7:24 AM  Result Value Ref Range   Glucose-Capillary 125 (H) 70 - 99 mg/dL  Heparin level (unfractionated)     Status: Abnormal   Collection Time: 09/10/20  9:56 AM  Result Value Ref Range   Heparin Unfractionated 0.29 (L) 0.30 - 0.70 IU/mL  Glucose, capillary     Status: Abnormal   Collection Time: 09/10/20 12:20 PM  Result Value Ref Range    Glucose-Capillary 163 (H) 70 - 99 mg/dL    Recent Results (from the past 240 hour(s))  Respiratory Panel by RT PCR (Flu A&B, Covid) - Nasopharyngeal Swab     Status: None   Collection Time: 09/08/20  5:13 PM   Specimen: Nasopharyngeal Swab  Result Value Ref Range Status   SARS Coronavirus 2 by RT PCR NEGATIVE NEGATIVE Final    Comment: (NOTE) SARS-CoV-2 target nucleic acids are NOT DETECTED.  The SARS-CoV-2 RNA is generally detectable in upper respiratoy specimens during the acute phase of infection. The lowest concentration of SARS-CoV-2 viral copies this assay can detect is 131 copies/mL. A negative result does not preclude SARS-Cov-2 infection and should not be used as the sole basis for treatment or other [name] management decisions. A negative result may occur with  improper specimen collection/handling, submission of specimen other than nasopharyngeal swab, presence of viral mutation(s) within the areas targeted by this assay, and inadequate number of viral copies (<131 copies/mL). A negative result must be combined with clinical observations, [name] history, and epidemiological information. The expected result is Negative.  Fact Sheet for Patients:  PinkCheek.be  Fact Sheet for Healthcare Providers:  GravelBags.it  This test is no t yet approved or cleared by the [place] FDA and  has been authorized for detection and/or diagnosis of SARS-CoV-2 by FDA under an Emergency Use Authorization (EUA). This EUA will remain  in effect (meaning this test can be used) for the duration of the COVID-19 declaration under Section 564(b)(1) of the Act, 21 U.S.C. section 360bbb-3(b)(1), unless the authorization is terminated or revoked sooner.     Influenza A by PCR NEGATIVE NEGATIVE Final   Influenza B by PCR NEGATIVE NEGATIVE Final    Comment: (NOTE) The Xpert Xpress SARS-CoV-2/FLU/RSV assay is intended as an aid in    the diagnosis of influenza from Nasopharyngeal swab specimens and  should not be used as a sole basis for treatment. Nasal washings and  aspirates  are unacceptable for Xpert Xpress SARS-CoV-2/FLU/RSV  testing.  Fact Sheet for Patients: PinkCheek.be  Fact Sheet for Healthcare Providers: GravelBags.it  This test is not yet approved or cleared by the [place] FDA and  has been authorized for detection and/or diagnosis of SARS-CoV-2 by  FDA under an Emergency Use Authorization (EUA). This EUA will remain  in effect (meaning this test can be used) for the duration of the  Covid-19 declaration under Section 564(b)(1) of the Act, 21  U.S.C. section 360bbb-3(b)(1), unless the authorization is  terminated or revoked. Performed at [facility], [place] [address] [place], [place] [address]   Blood Cultures x 2 sites     Status: None (Preliminary result)   Collection Time: 09/08/20  7:04 PM   Specimen: BLOOD  Result Value Ref Range Status   Specimen Description   Final    BLOOD LEFT WRIST Performed at [facility] [address], [place], [place] [address]    Special Requests   Final    BOTTLES DRAWN AEROBIC AND ANAEROBIC Blood Culture adequate volume Performed at [facility] [address], [place], [place] [address]    Culture   Final    NO GROWTH 2 DAYS Performed at [facility] [address], [place], [place] [address]    Report Status PENDING  Incomplete  Culture, blood (x 2)     Status: None (Preliminary result)   Collection Time: 09/08/20  9:03 PM   Specimen: BLOOD LEFT HAND  Result Value Ref Range Status   Specimen Description   Final    BLOOD LEFT HAND Performed at [facility] [address], [place], [place] [address]    Special Requests   Final    BOTTLES DRAWN AEROBIC AND ANAEROBIC Blood Culture adequate volume Performed  at [facility] [address] [place], [place] [address]    Culture   Final    NO GROWTH 2 DAYS Performed at [facility] [address] [place], [place] [address]    Report Status PENDING  Incomplete     Radiology Studies: VAS [place] ABI WITH/WO TBI  Result Date: 09/10/2020 LOWER EXTREMITY DOPPLER STUDY Indications: Ulceration, and gangrene. High Risk Factors: Hypertension, Diabetes. Other Factors: Venous stasis dermatitis.  Limitations: Today's exam was limited due to significant wounds and extremely              thick, crusted skin throughout bilateral calves, ankles, and feet,              limiting/prohibiting the penetration of ultrasound signal. Comparison Study: 10/03/2019- ABI Performing Technologist: Maudry Mayhew MHA, RVT, RDCS, RDMS  Examination Guidelines: A complete evaluation includes at minimum, Doppler waveform signals and systolic blood pressure reading at the level of bilateral brachial, anterior tibial, and posterior tibial arteries, when vessel segments are accessible. Bilateral testing is considered an integral part of a complete examination. Photoelectric Plethysmograph (PPG) waveforms and toe systolic pressure readings are included as required and additional duplex testing as needed. Limited examinations for reoccurring indications may be performed as noted.  ABI Findings: +---------+------------------+-----+--------+----------------------------------+ Right    Rt Pressure (mmHg)IndexWaveformComment                            +---------+------------------+-----+--------+----------------------------------+ Brachial  Unable to evaluate due to IV                                               location                           +---------+------------------+-----+--------+----------------------------------+ PTA                                     Unable to insonate due to                                                   technical limitations              +---------+------------------+-----+--------+----------------------------------+ DP                              biphasicUnable to obtain pressure due to                                           technical limitations              +---------+------------------+-----+--------+----------------------------------+ Great Toe                       Present                                    +---------+------------------+-----+--------+----------------------------------+ +---------+------------------+-----+--------+----------------------------------+ Left     Lt Pressure (mmHg)IndexWaveformComment                            +---------+------------------+-----+--------+----------------------------------+ Brachial                                Not evaluated                      +---------+------------------+-----+--------+----------------------------------+ PTA                                     Unable to insonate due to                                                  technical limitations              +---------+------------------+-----+--------+----------------------------------+ DP                                      Unable to insonate due to  technical limitations              +---------+------------------+-----+--------+----------------------------------+ Great Toe                       Present                                    +---------+------------------+-----+--------+----------------------------------+  Summary: Bilateral: Extremely limited exam due to technical limtiations. There is evidence of perfusion through to bilateral great toes, however unable to quantify due to technical limitations.  *See table(s) above for measurements and observations.     Preliminary    VAS [place] ABI WITH/WO TBI      DG Foot Complete Right  Final Result    DG  Ribs Unilateral W/Chest Right    (Results Pending)    Scheduled Meds: . (feeding supplement) PROSource Plus  30 mL Oral QID  . apixaban  5 mg Oral BID  . furosemide  40 mg Intravenous Daily  . insulin aspart  0-15 Units Subcutaneous TID WC  . insulin aspart  0-5 Units Subcutaneous QHS  . living well with diabetes book   Does not apply Once  . metoprolol tartrate  50 mg Oral BID  . multivitamin with minerals  1 tablet Oral Daily   PRN Meds: acetaminophen **OR** acetaminophen, HYDROmorphone (DILAUDID) injection, labetalol, melatonin Continuous Infusions: . ceFEPime (MAXIPIME) IV 2 g (09/10/20 0915)  . DAPTOmycin (CUBICIN)  IV    . metronidazole 500 mg (09/10/20 1216)     LOS: 2 days  Time spent: Greater than 50% of the 35 minute visit was spent in counseling/coordination of care for the [name] as laid out in the A&P.   Dwyane Dee, MD [facility] 09/10/2020, 4:35 PM

## 2020-09-10 NOTE — Consult Note (Addendum)
WOC Nurse Consult Note: Refer to [place] notes from yesterday.  Bilat compression wraps were removed to perform ABI earlier; requested to re-apply.   Left leg with generalized edema and dry crusted skin; no open wounds or weeping.  Dry black foul-smelling crusted scabs to plantar foot and between toes. Attempted to wash/clean/remove but it is difficult since the layers of scabs are very adhered.  Right leg with generalized edema and dry crusted skin. Dry black foul-smelling crusted scabs to plantar foot and between toes.  Attempted to wash/clean/remove but it is difficult since the layers of scabs are very adhered.  Full thickness wound to right anterior foot is 3X3X.8cm, 100% yellow/black slough, strong foul odor, mod amt brown drainage. Right plantar heel with full thickness wound; 8X8X.5cm, 50% red, 50% slough/eschar, strong foul odor, mod amt brown drainage. Right anterior calf with red moist partial thickness wound, 5X5X.1cm, small amt red drainage. Applied Aquacel to wounds on RLE, then applied 4 layer Profore compression wraps to bilat legs as requested by Dr Sharol Given.  WOC will change Q Wed while in the hospital; pt will need home health assistance to change Aquacel/compression wraps weekly after discharge.   Julien Girt MSN, RN, [place], [place], [place]

## 2020-09-10 NOTE — Assessment & Plan Note (Signed)
-  Tachycardic, tachypneic, elevated WBC, elevated lactic acid.  Source considered right foot -See osteomyelitis above

## 2020-09-10 NOTE — Plan of Care (Signed)
  Problem: Education: Goal: Knowledge of General Education information will improve Description Including pain rating scale, medication(s)/side effects and non-pharmacologic comfort measures Outcome: Progressing   Problem: Clinical Measurements: Goal: Ability to maintain clinical measurements within normal limits will improve Outcome: Progressing   Problem: Activity: Goal: Risk for activity intolerance will decrease Outcome: Progressing   

## 2020-09-11 DIAGNOSIS — N179 Acute kidney failure, unspecified: Secondary | ICD-10-CM

## 2020-09-11 DIAGNOSIS — I5033 Acute on chronic diastolic (congestive) heart failure: Secondary | ICD-10-CM | POA: Diagnosis not present

## 2020-09-11 DIAGNOSIS — M869 Osteomyelitis, unspecified: Secondary | ICD-10-CM | POA: Diagnosis not present

## 2020-09-11 DIAGNOSIS — R609 Edema, unspecified: Secondary | ICD-10-CM

## 2020-09-11 DIAGNOSIS — N1831 Chronic kidney disease, stage 3a: Secondary | ICD-10-CM

## 2020-09-11 LAB — GLUCOSE, CAPILLARY
Glucose-Capillary: 159 mg/dL — ABNORMAL HIGH (ref 70–99)
Glucose-Capillary: 156 mg/dL — ABNORMAL HIGH (ref 70–99)
Glucose-Capillary: 148 mg/dL — ABNORMAL HIGH (ref 70–99)
Glucose-Capillary: 109 mg/dL — ABNORMAL HIGH (ref 70–99)

## 2020-09-11 LAB — CBC WITH DIFFERENTIAL/PLATELET
Abs Immature Granulocytes: 0.09 10*3/uL — ABNORMAL HIGH (ref 0.00–0.07)
Basophils Absolute: 0.1 10*3/uL (ref 0.0–0.1)
Basophils Relative: 1 %
Eosinophils Absolute: 0.1 10*3/uL (ref 0.0–0.5)
Eosinophils Relative: 1 %
HCT: 37.2 % — ABNORMAL LOW (ref 39.0–52.0)
Hemoglobin: 11.7 g/dL — ABNORMAL LOW (ref 13.0–17.0)
Immature Granulocytes: 1 %
Lymphocytes Relative: 9 %
Lymphs Abs: 1.1 10*3/uL (ref 0.7–4.0)
MCH: 27.5 pg (ref 26.0–34.0)
MCHC: 31.5 g/dL (ref 30.0–36.0)
MCV: 87.3 fL (ref 80.0–100.0)
Monocytes Absolute: 1.4 10*3/uL — ABNORMAL HIGH (ref 0.1–1.0)
Monocytes Relative: 11 %
Neutro Abs: 9.9 10*3/uL — ABNORMAL HIGH (ref 1.7–7.7)
Neutrophils Relative %: 77 %
Platelets: 248 10*3/uL (ref 150–400)
RBC: 4.26 MIL/uL (ref 4.22–5.81)
RDW: 14.4 % (ref 11.5–15.5)
WBC: 12.6 10*3/uL — ABNORMAL HIGH (ref 4.0–10.5)
nRBC: 0 % (ref 0.0–0.2)

## 2020-09-11 LAB — BASIC METABOLIC PANEL
Anion gap: 13 (ref 5–15)
BUN: 56 mg/dL — ABNORMAL HIGH (ref 8–23)
CO2: 18 mmol/L — ABNORMAL LOW (ref 22–32)
Calcium: 7.8 mg/dL — ABNORMAL LOW (ref 8.9–10.3)
Chloride: 102 mmol/L (ref 98–111)
Creatinine, Ser: 2.78 mg/dL — ABNORMAL HIGH (ref 0.61–1.24)
GFR, Estimated: 24 mL/min — ABNORMAL LOW (ref >60)
Glucose, Bld: 164 mg/dL — ABNORMAL HIGH (ref 70–99)
Potassium: 4 mmol/L (ref 3.5–5.1)
Sodium: 133 mmol/L — ABNORMAL LOW (ref 135–145)

## 2020-09-11 LAB — MAGNESIUM: Magnesium: 1.6 mg/dL — ABNORMAL LOW (ref 1.7–2.4)

## 2020-09-11 MED ORDER — ALBUMIN HUMAN 25 % IV SOLN
25.0000 g | Freq: Four times a day (QID) | INTRAVENOUS | Status: AC
  Administered 2020-09-11: 12.5 g via INTRAVENOUS
  Administered 2020-09-11 – 2020-09-12 (×3): 25 g via INTRAVENOUS
  Administered 2020-09-12: 12.5 g via INTRAVENOUS
  Administered 2020-09-12 – 2020-09-13 (×3): 25 g via INTRAVENOUS
  Filled 2020-09-11 (×8): qty 100

## 2020-09-11 MED ORDER — MAGNESIUM OXIDE 400 (241.3 MG) MG PO TABS
800.0000 mg | ORAL_TABLET | Freq: Once | ORAL | Status: AC
  Administered 2020-09-11: 800 mg via ORAL
  Filled 2020-09-11: qty 2

## 2020-09-11 NOTE — Progress Notes (Signed)
Inpatient Diabetes Program Recommendations  AACE/ADA: New Consensus Statement on Inpatient Glycemic Control (2015)  Target Ranges:  Prepandial:   less than 140 mg/dL      Peak postprandial:   less than 180 mg/dL (1-2 hours)      Critically ill patients:  140 - 180 mg/dL   Lab Results  Component Value Date   GLUCAP 159 (H) 09/11/2020   HGBA1C 9.6 (H) 09/08/2020    Review of Glycemic Control  Diabetes history: DM2 Outpatient Diabetes medications: Glucotrol 10 mg qd Current orders for Inpatient glycemic control: Novolog 0-15 units tid + hs 0-5 units  Inpatient Diabetes Program Recommendations:   Spoke with pt and Discussed A1C results with them and explained what an A1C is, basic pathophysiology of DM Type 2, basic home care, basic diabetes diet nutrition principles, importance of checking CBGs and maintaining good CBG control to prevent long-term and short-term complications. Reviewed signs and symptoms of hyperglycemia and hypoglycemia and how to treat hypoglycemia at home. Also reviewed blood sugar goals at home.  RNs to provide ongoing basic DM education at bedside with this patient.   Patient was having back pain so limited engagement in conversation concerning diabetes management.  Thank you, Nani Gasser. Naiya Corral, RN, MSN, CDE  Diabetes Coordinator Inpatient Glycemic Control Team Team Pager [phone] (8am-5pm) 09/11/2020 11:32 AM

## 2020-09-11 NOTE — Care Management Important Message (Signed)
Important Message  Patient Details IM Letter given to the Patient Name: [name]. MRN: [id number] Date of Birth: [birth year]   Medicare Important Message Given:  Yes     Kerin Salen 09/11/2020, 11:04 AM

## 2020-09-11 NOTE — Assessment & Plan Note (Signed)
-  Continue lower extremity compression wrappings.  Will follow up if able to set him up with outpatient wound care center versus home health or combination -This is preventing adequate ABI evaluation

## 2020-09-11 NOTE — TOC Progression Note (Addendum)
Transition of Care (TOC) - Progression Note    Patient Details  Name: [name]. MRN: [id number] Date of Birth: 01-07-1951  Transition of Care [facility]) CM/SW Contact  Ross Ludwig, [place] Phone Number: 09/11/2020, 5:15 PM  Clinical Narrative:     CSW spoke to patient and explained recommendations for SNF and what to expect.  CSW explained how insurance will pay for the stay and the process for finding placement.  Patient is agreeable to going to SNF, CSW has began bed search in [facility], awaiting bed offers.  CSW attempted to call patient's daughter [name] and left a message waiting for a call back.   Expected Discharge Plan: Home/Self Care Barriers to Discharge: Continued Medical Work up  Expected Discharge Plan and Services Expected Discharge Plan: Home/Self Care In-house Referral: Clinical Social Work     Living arrangements for the past 2 months: Single Family Home                                       Social Determinants of Health (SDOH) Interventions    Readmission Risk Interventions No flowsheet data found.

## 2020-09-11 NOTE — Progress Notes (Signed)
C/O cant breath wants to sit up on side of bed, very aggressive. Instructed that he's fall risk that he cannot sit up on side of unless someone is at bed side.then state he has to have a BM. Placed on bedside commode, No BM. Back to bed.

## 2020-09-11 NOTE — TOC Initial Note (Signed)
Transition of Care (TOC) - Initial/Assessment Note    Patient Details  Name: [name]. MRN: [id number] Date of Birth: 1951-11-05  Transition of Care [facility]) CM/SW Contact:    Ross Ludwig, LCSW Phone Number: 09/11/2020, 12:01 PM  Clinical Narrative:                  Patient is a 69 year old male who is alert and oriented x4.  Patient lives with his wife, who is currently in a SNF for short term rehab.  Adult Protective Services are currently following patient and his wife.  CSW spoke to patient's daughter Oswaldo Milian [phone], she expressed concern that patient may not have capacity, CSW requested that psych sees patient, psych saw patient yesterday and said he does have competency and is aware of his medical needs, issues, and causes.  Patient's daughter feels that patient is not safe to return back home on his own, CSW informed her that patient has right to make poor decisions and there is not much that can be done regarding his choices.  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  CSW advised they can call Adult Protective Services again, but CSW can not do anything regarding APS.  CSW informed patient's daughter that PT was ordered to see patient and they have not seen him yet.  Patient's daughter stated that she is his 80, CSW informed her that only comes into play if patient is not able to make his own decisions.  CSW to continue to follow patient's progress throughout discharge planning and awaiting PT recommendations.  Expected Discharge Plan: Home/Self Care Barriers to Discharge: Continued Medical Work up   Patient Goals and CMS Choice Patient states their goals for this hospitalization and ongoing recovery are:: To return back home.      Expected Discharge Plan and Services Expected Discharge Plan: Home/Self Care In-house Referral: Clinical Social Work     Living  arrangements for the past 2 months: Single Family Home                                      Prior Living Arrangements/Services Living arrangements for the past 2 months: Single Family Home Lives with:: Spouse Patient language and need for interpreter reviewed:: Yes Do you feel safe going back to the place where you live?: Yes      Need for Family Participation in Patient Care: No (Comment) Care giver support system in place?: No (comment)   Criminal Activity/Legal Involvement Pertinent to Current Situation/Hospitalization: No - Comment as needed  Activities of Daily Living Home Assistive Devices/Equipment: Eyeglasses, Cane (specify quad or straight) ADL Screening (condition at time of admission) Patient's cognitive ability adequate to safely complete daily activities?: Yes Is the patient deaf or have difficulty hearing?: No Does the patient have difficulty seeing, even when wearing glasses/contacts?: No Does the patient have difficulty concentrating, remembering, or making decisions?: No Patient able to express need for assistance with ADLs?: Yes Does the patient have difficulty dressing or bathing?: No Independently performs ADLs?: Yes (appropriate for developmental age) Does the patient have difficulty walking or climbing stairs?: No Weakness of Legs: Both Weakness of Arms/Hands: Both  Permission Sought/Granted Permission sought to share information with : Facility Sport and exercise psychologist, Family Supports Permission granted to share information with : Yes, Verbal Permission Granted  Share Information with NAME: Karie Schwalbe Daughter [phone]           Emotional Assessment Appearance:: Appears stated age   Affect (typically observed): Accepting, Appropriate, Calm, Stable Orientation: : Oriented to Self, Oriented to Place, Oriented to  Time, Oriented to Situation Alcohol / Substance Use: Not Applicable Psych Involvement: Yes (comment)  Admission diagnosis:   Fall [W19.XXXA] Sepsis ([place]) [A41.9] Other acute osteomyelitis of right foot [facility]) [M86.171] Patient Active Problem List   Diagnosis Date Noted  . Sepsis ([place]) 09/08/2020  . Osteomyelitis of fifth toe of right foot ([place]) 09/08/2020  . ARF (acute renal failure) ([place]) 09/08/2020  . Acute exacerbation of CHF (congestive heart failure) ([place]) 02/02/2020  . Atrial fibrillation with RVR ([place]) 01/31/2020  . Venous stasis dermatitis 11/19/2019  . Diabetic foot infection ([place]) 10/22/2019  . Lymphedema 10/22/2019  . Acute on chronic heart failure with preserved ejection fraction (HFpEF) ([place]) 06/20/2018  . Benign prostatic hyperplasia with urinary obstruction 01/30/2016  . Primary localized osteoarthrosis of pelvic region 01/29/2015  . Benign fibroma of prostate 06/17/2014  . Bladder neck obstruction 12/17/2013  . Elevated prostate specific antigen (PSA) 12/17/2013  . ED (erectile dysfunction) of organic origin 12/17/2013  . Decreased libido 12/17/2013  . Type 2 diabetes mellitus ([place]) 03/19/2013  . Essential hypertension 03/19/2013   PCP:  Aura Dials, MD Pharmacy:   [place], [place] - [address] [place] [address] Phone: [phone] Fax: [phone]     Social Determinants of Health (SDOH) Interventions    Readmission Risk Interventions No flowsheet data found.

## 2020-09-11 NOTE — Progress Notes (Signed)
C/O has to have a BM wants up again. Very aggressive verbal. Back to bedside commode. Back to bed.

## 2020-09-11 NOTE — Progress Notes (Signed)
C/O unable to breath in bed PO 100% RA. Very aggressive verbal. OOB to recliner legs elevated in chair for 106minutes wants to get back to bed/ On telephone talking to family how the nurses is a bunch of Assholes.[name]  onece in chair he has to stay for at lease an hour states ok' Will cont to asses safety status.

## 2020-09-11 NOTE — Progress Notes (Signed)
C/O being hungry states he cannot eat without setting on side of bed. Has multi med at this time let side up on side of bed . Phillip Heal crakers and peanut butter given,  Sitting up on side of bed for 10 minutes then want to lie down again. Just back and forth. Dont want to understand reasons/

## 2020-09-11 NOTE — NC FL2 (Signed)
[place] LEVEL OF CARE SCREENING TOOL     IDENTIFICATION  Patient Name: [name]. Birthdate: 1950/12/03 Sex: male Admission Date (Current Location): 09/08/2020  [facility] and [place] Number:  Herbalist and Address:  [facility],  [place] [address] [place]      Provider Number: [id number]  Attending Physician Name and Address:  Dwyane Dee, MD  Relative Name and Phone Number:  Karie Schwalbe Daughter [phone]    Current Level of Care: Hospital Recommended Level of Care: [place] Prior Approval Number:    Date Approved/Denied:   PASRR Number: [id number]  Discharge Plan: SNF    Current Diagnoses: Patient Active Problem List   Diagnosis Date Noted  . Chronic edema 09/11/2020  . Sepsis ([place]) 09/08/2020  . Osteomyelitis of fifth toe of right foot ([place]) 09/08/2020  . Acute renal failure superimposed on stage 3a chronic kidney disease ([place]) 09/08/2020  . Acute exacerbation of CHF (congestive heart failure) ([place]) 02/02/2020  . Atrial fibrillation with RVR ([place]) 01/31/2020  . Venous stasis dermatitis 11/19/2019  . Diabetic foot infection ([place]) 10/22/2019  . Lymphedema 10/22/2019  . Acute on chronic heart failure with preserved ejection fraction (HFpEF) ([place]) 06/20/2018  . Benign prostatic hyperplasia with urinary obstruction 01/30/2016  . Primary localized osteoarthrosis of pelvic region 01/29/2015  . Benign fibroma of prostate 06/17/2014  . Bladder neck obstruction 12/17/2013  . Elevated prostate specific antigen (PSA) 12/17/2013  . ED (erectile dysfunction) of organic origin 12/17/2013  . Decreased libido 12/17/2013  . Type 2 diabetes mellitus ([place]) 03/19/2013  . Essential hypertension 03/19/2013    Orientation RESPIRATION BLADDER Height & Weight     Self, Time, Situation, Place  Normal Incontinent Weight: 275 lb (124.7 kg) Height:  5\' 9"  (175.3 cm)  BEHAVIORAL SYMPTOMS/MOOD NEUROLOGICAL  BOWEL NUTRITION STATUS      Continent Diet (Carb Modified diet)  AMBULATORY STATUS COMMUNICATION OF NEEDS Skin   Limited Assist Verbally PU Stage and Appropriate Care     PU Stage 3 Dressing:  (Weekly dressing changes)                 Personal Care Assistance Level of Assistance  Bathing, Feeding, Dressing Bathing Assistance: Limited assistance Feeding assistance: Independent Dressing Assistance: Limited assistance     Functional Limitations Info  Sight, Hearing, Speech Sight Info: Adequate Hearing Info: Adequate Speech Info: Adequate    SPECIAL CARE FACTORS FREQUENCY  PT (By licensed PT), OT (By licensed OT)     PT Frequency: Minimum 5x a week OT Frequency: Minimum 5x a week            Contractures Contractures Info: Not present    Additional Factors Info  Code Status, Allergies, Insulin Sliding Scale Code Status Info: Full Code Allergies Info: Codeine Sulfur   Insulin Sliding Scale Info: insulin aspart (novoLOG) injection 0-15 Units Minimum 3X a day with meals       Current Medications (09/11/2020):  This is the current hospital active medication list Current Facility-Administered Medications  Medication Dose Route Frequency Provider Last Rate Last Admin  . (feeding supplement) PROSource Plus liquid 30 mL  30 mL Oral QID Dwyane Dee, MD   30 mL at 09/11/20 1229  . acetaminophen (TYLENOL) tablet 650 mg  650 mg Oral Q6H PRN Donne Hazel, MD   650 mg at 09/09/20 2228   Or  . acetaminophen (TYLENOL) suppository 650 mg  650 mg Rectal Q6H PRN Donne Hazel, MD      .  albumin human 25 % solution 25 g  25 g Intravenous Q6H Girguis, Shanon Brow, MD      . apixaban Arne Cleveland) tablet 5 mg  5 mg Oral BID Dwyane Dee, MD   5 mg at 09/11/20 1049  . ceFEPIme (MAXIPIME) 2 g in sodium chloride 0.9 % 100 mL IVPB  2 g Intravenous Q12H Efraim Kaufmann, [facility]   Stopped at 09/11/20 1240  . DAPTOmycin (CUBICIN) 740 mg in sodium chloride 0.9 % IVPB  740 mg Intravenous Q2000  Dwyane Dee, MD 229.6 mL/hr at 09/10/20 2324 740 mg at 09/10/20 2324  . furosemide (LASIX) injection 40 mg  40 mg Intravenous Daily Donne Hazel, MD   40 mg at 09/11/20 1049  . HYDROmorphone (DILAUDID) injection 0.5-1 mg  0.5-1 mg Intravenous Q4H PRN Donne Hazel, MD   1 mg at 09/10/20 2206  . insulin aspart (novoLOG) injection 0-15 Units  0-15 Units Subcutaneous TID WC Donne Hazel, MD   3 Units at 09/11/20 1225  . insulin aspart (novoLOG) injection 0-5 Units  0-5 Units Subcutaneous QHS Donne Hazel, MD   2 Units at 09/10/20 2214  . labetalol (NORMODYNE) injection 5 mg  5 mg Intravenous Q2H PRN Donne Hazel, MD   5 mg at 09/09/20 0844  . melatonin tablet 6 mg  6 mg Oral QHS PRN Lang Snow, FNP   6 mg at 09/10/20 2205  . metroNIDAZOLE (FLAGYL) IVPB 500 mg  500 mg Intravenous Lenise Arena, MD 100 mL/hr at 09/11/20 1242 500 mg at 09/11/20 1242  . multivitamin with minerals tablet 1 tablet  1 tablet Oral Daily Dwyane Dee, MD   1 tablet at 09/11/20 1049     Discharge Medications: Please see discharge summary for a list of discharge medications.  Relevant Imaging Results:  Relevant Lab Results:   Additional Information SSN [id number]  Ross Ludwig, LCSW

## 2020-09-11 NOTE — Progress Notes (Signed)
Pharmacy Antibiotic Note  [name]. is a 69 y.o. male admitted on 09/08/2020 with wound infection.  Pharmacy has been consulted for cefepime dosing.   Day #4 antibiotics (cefepime/metronidazole/daptomycin) - Afebrile - WBC improved - AKI, vancomycin changed to daptomycin  Plan: Continue Cefepime 2 g IV every 12 hours Monitor renal function and adjust dosing interval as needed  Height: 5\' 9"  (175.3 cm) Weight: 124.7 kg (275 lb) IBW/kg (Calculated) : 70.7  Temp (24hrs), Avg:98.4 F (36.9 C), Min:98 F (36.7 C), Max:98.7 F (37.1 C)  Recent Labs  Lab 09/08/20 1446 09/08/20 1726 09/08/20 1904 09/09/20 0722 09/09/20 0726 09/10/20 0450 09/11/20 0410  WBC 20.1*  --  17.7*  --  12.7* 15.7* 12.6*  CREATININE 1.98*  --  1.96* 2.19*  --  2.77* 2.78*  LATICACIDVEN 2.7* 1.8  --   --   --   --   --     Estimated Creatinine Clearance: 31.8 mL/min (A) (by C-G formula based on SCr of 2.78 mg/dL (H)).    Allergies  Allergen Reactions  . Codeine Swelling    Water retention  . Sulfur Other (See Comments)    Not sure of reaction a young child    Antimicrobials this admission: 10/25 Zosyn x 1 10/25 Vancomycin >> 10/27 10/25 Cefepime >> 10/27 Flagyl >>  10/27 Dapto >>   Dose adjustments this admission:    Microbiology results: 10/25 BCx: ngtd 10/25 Resp panel: negative  Thank you for allowing pharmacy to be a part of this patient's care.  Peggyann Juba, PharmD, BCPS Pharmacy: [phone] 09/11/2020 8:21 AM

## 2020-09-11 NOTE — Progress Notes (Signed)
Orthopedic Tech Progress Note Patient Details:  [name] 1951/01/24 [id number]  Patient ID: [name], male   DOB: 06/06/1951, 69 y.o.   MRN: [id number]   [name] 09/11/2020, 2:09 PM Applied larger sized prafo

## 2020-09-11 NOTE — Evaluation (Signed)
Physical Therapy Evaluation Patient Details Name: [name]. MRN: [id number] DOB: 22-Sep-1951 Today's Date: 09/11/2020   History of Present Illness  69 y.o. male with PMH of DM2, CHF, afib, HTN, R THA in 2016 presents to ED after being found down in the bathroom this AM during welfare check; xray found osteomyelitis of 5th toe on R foot.  Clinical Impression  Pt admitted with above diagnosis. PTA, pt independent and cooks meals for spouse. Pt currently requiring mod A to return to supine in bed and min A to scoot laterally to HOB. Pt requires 2 person max assist to scoot up in bed and reposition, possibly due to back pain complaints while lying flat. Ambulation limited due to [facility] too small, will assess ambulation once PRAFO fits appropriately. Pt with decreased R foot sensation, no real pain complaints during session. Pt currently with functional limitations due to the deficits listed below (see PT Problem List). Pt will benefit from skilled PT to increase their independence and safety with mobility to allow discharge to the venue listed below.       Follow Up Recommendations SNF    Equipment Recommendations  None recommended by PT    Recommendations for Other Services       Precautions / Restrictions Precautions Precautions: Fall Required Braces or Orthoses: Other Brace Other Brace: R foot PRAFO 24/7 Restrictions Weight Bearing Restrictions: No      Mobility  Bed Mobility Overal bed mobility: Needs Assistance Bed Mobility: Sit to Supine  Sit to supine: Mod assist  General bed mobility comments: seated EOB upon arrival, mod A to lift BLE back into bed, max A x2 to scoot up in bed and reposition    Transfers Overall transfer level: Needs assistance Equipment used: None Transfers: Lateral/Scoot Transfers   Lateral/Scoot Transfers: Min assist General transfer comment: lateral scoot along bedside to scoot up to [facility] with min A, able to clear bottom from bed ~1  inch  Ambulation/Gait             General Gait Details: unable to take steps due to [facility] too small  Stairs            Wheelchair Mobility    Modified Rankin (Stroke Patients Only)       Balance Overall balance assessment: No apparent balance deficits (not formally assessed) (no deficits seated EOB)                  Pertinent Vitals/Pain Pain Assessment: No/denies pain    Home Living Family/patient expects to be discharged to:: Private residence Living Arrangements: Spouse/significant other (wife is in rehab currently) Available Help at Discharge: Family;Available PRN/intermittently (daughter is nurse, able to assist occasionally) Type of Home: House Home Access: Stairs to enter Entrance Stairs-Rails: None Entrance Stairs-Number of Steps: 4-5 into game room, 1 into house Home Layout: Able to live on main level with bedroom/bathroom;Two level Home Equipment: Walker - 2 wheels;Cane - single point;Bedside commode Additional Comments: Pt reports he cooks food or picks up take out. Pt reports spouse is d/c from SNF today.    Prior Function Level of Independence: Independent         Comments: Pt reports independent with ADLs, ambulates community distances with RW or shopping cart, 1 fall in last 6 months, drives.     Hand Dominance   Dominant Hand: Right    Extremity/Trunk Assessment   Upper Extremity Assessment Upper Extremity Assessment: Overall WFL for tasks assessed    Lower Extremity  Assessment Lower Extremity Assessment: Generalized weakness;RLE deficits/detail RLE Deficits / Details: wound dressing on, AROM WNL RLE Sensation: decreased light touch    Cervical / Trunk Assessment Cervical / Trunk Assessment: Normal  Communication   Communication: No difficulties  Cognition Arousal/Alertness: Awake/alert Behavior During Therapy: WFL for tasks assessed/performed Overall Cognitive Status: Within Functional Limits for tasks assessed   General Comments: pt A&O x4      General Comments      Exercises     Assessment/Plan    PT Assessment Patient needs continued PT services  PT Problem List Decreased strength;Decreased range of motion;Decreased activity tolerance;Decreased balance;Decreased mobility;Decreased knowledge of use of DME;Decreased safety awareness;Impaired sensation;Obesity;Decreased skin integrity       PT Treatment Interventions DME instruction;Gait training;Functional mobility training;Therapeutic activities;Therapeutic exercise;Balance training;Neuromuscular re-education;Patient/family education    PT Goals (Current goals can be found in the Care Plan section)  Acute Rehab PT Goals Patient Stated Goal: "go back home" PT Goal Formulation: With patient Time For Goal Achievement: 09/25/20 Potential to Achieve Goals: Good    Frequency Min 3X/week   Barriers to discharge        Co-evaluation               AM-PAC PT "6 Clicks" Mobility  Outcome Measure Help needed turning from your back to your side while in a flat bed without using bedrails?: A Little Help needed moving from lying on your back to sitting on the side of a flat bed without using bedrails?: A Lot Help needed moving to and from a bed to a chair (including a wheelchair)?: A Lot Help needed standing up from a chair using your arms (e.g., wheelchair or bedside chair)?: A Lot Help needed to walk in hospital room?: A Lot Help needed climbing 3-5 steps with a railing? : Total 6 Click Score: 12    End of Session   Activity Tolerance: Patient tolerated treatment well Patient left: in bed;with call bell/phone within reach;with bed alarm set Nurse Communication: Mobility status;Other (comment) (PRAFO too small) PT Visit Diagnosis: Unsteadiness on feet (R26.81);Other abnormalities of gait and mobility (R26.89);Muscle weakness (generalized) (M62.81)    Time: 2993-7169 PT Time Calculation (min) (ACUTE ONLY): 23 min   Charges:    PT Evaluation $PT Eval Moderate Complexity: 1 Mod           Tori Kiefer Opheim PT, DPT 09/11/20, 1:43 PM

## 2020-09-11 NOTE — Plan of Care (Signed)
  Problem: Education: Goal: Knowledge of General Education information will improve Description: Including pain rating scale, medication(s)/side effects and non-pharmacologic comfort measures Outcome: Progressing   Problem: Clinical Measurements: Goal: Will remain free from infection Outcome: Progressing Goal: Diagnostic test results will improve Outcome: Progressing Goal: Respiratory complications will improve Outcome: Progressing Goal: Cardiovascular complication will be avoided Outcome: Progressing   Problem: Activity: Goal: Risk for activity intolerance will decrease Outcome: Progressing   Problem: Nutrition: Goal: Adequate nutrition will be maintained Outcome: Progressing   Problem: Coping: Goal: Level of anxiety will decrease Outcome: Progressing   Problem: Elimination: Goal: Will not experience complications related to bowel motility Outcome: Progressing Goal: Will not experience complications related to urinary retention Outcome: Progressing   Problem: Pain Managment: Goal: General experience of comfort will improve Outcome: Progressing   Problem: Safety: Goal: Ability to remain free from injury will improve Outcome: Progressing   Problem: Skin Integrity: Goal: Risk for impaired skin integrity will decrease Outcome: Progressing   

## 2020-09-11 NOTE — Progress Notes (Signed)
PROGRESS NOTE    [name] [facility].   YSJ:[id number]  DOB: March 01, 1951  DOA: 09/08/2020     3  PCP: Henrine Screws, MD  CC: found down at home  Hospital Course: [name] is a 69 y.o. male with medical history significant of DM2, dCHF, PAF (supposed to be on Eliquis), HTN who presented to ED after being found down in the bathroom during welfare check.   Per report, patient noted to have an unsanitary living condition and is currently followed by adult protective services. Patient reports falling while standing up from bathroom without LOC, resulting in fall onto R chest and resultant R lower chest pain. Patient was found down and brought into ED.    Of note, patient reports stopping all medications several months ago with exception of torsemide because "I didn't think I needed them anymore." Pt has only been taking torsemide PRN swelling.  He was found to have necrotic foot infections and underwent xray of right foot, which revealed subcortical lucency involving 5th digit concerning for OM.  He was started on vancomycin and cefepime and orthopedic surgery was consulted. ABIs were also ordered.  Test was inconclusive due to severe lower extremity edema.   Initially patient was also against surgical treatment options despite severity of his wounds, their obvious chronicity, and inability to explain consequences for avoiding surgery. We requested psychiatry to evaluate patient for decision making ability in this setting. After repeat explanation of his diagnosis and possible treatment options he was able to voice adequate understanding to psychiatry and was considered to have decision making capacity regarding his current infection.   He will need to continue with serial compression wrappings at discharge in efforts to promote better chance of wound healing and proper assessment of ABIs to help decide best treatment course.   Given his significant deconditioning and after discussion with  his daughter, there is concern for his ability to safely return home due to poor living situation; Physical therapy was consulted.   Interval History:  No events overnight.  Talked with his daughter today on the phone.  There is great concern for patient returning home due to unlivable conditions and the house is in squalor.  PT has also evaluated and patient requires moderate assistance; patient most likely needs SNF.  Discussed with daughter that she needs to try and encourage patient for this recommendation as well.   Old records reviewed in assessment of this patient  ROS: Constitutional: negative for chills and fevers, Respiratory: negative for cough, Cardiovascular: negative for chest pain and Gastrointestinal: negative for abdominal pain  Assessment & Plan: * Osteomyelitis of fifth toe of right foot (HCC) - probable OM per xray; ABIs indeterminate due to LE edema - patient against inpatient surgery at this time regardless - tentative plan is for IV abx with transition to oral antibiotics on Friday  - patient needs serial compression wrappings to LE to aid with improving edema - CRP 8.2, ESR 55. Will serve as baseline going forward  -Outpatient follow-up with orthopedic surgery  Sepsis (HCC) -Tachycardic, tachypneic, elevated WBC, elevated lactic acid.  Source considered right foot -See osteomyelitis above  Chronic edema -Continue lower extremity compression wrappings.  Will follow up if able to set him up with outpatient wound care center versus home health or combination -This is preventing adequate ABI evaluation  Atrial fibrillation with RVR (HCC) - RVR considered in setting of sepsis on admission - responded to BB (patient noncompliant at home) - started on heparin  drip on admission; given no upcoming surgery at this point, d/c heparin drip and resume Eliquis (will need to counsel patient on compliance at d/c once again)  Acute renal failure superimposed on stage 3a  chronic kidney disease (HCC) - baseline creatinine approx 1.2 - 1.3 - creatinine 1.98 on admission which has uptrended - creatinine uptrending still; will give trial of albumin with lasix - monitor renal function on diuresis   Acute on chronic heart failure with preserved ejection fraction (HFpEF) ([place]) Most recent 2d echo from 3/21 with EF of 40-45%, unable to evaluate diastolic dysfunction at that time - continue lasix; albumin 2.4; given uptrending creatining with severe edema, will give trial of albumin with lasix   Essential hypertension - continue current regimen   Type 2 diabetes mellitus (HCC) - A1c 9.6% - continue SSI and CBG for now - patient also noncompliant with regimen at home; states "I didn't think I need it"   Antimicrobials: Vanc 10/25>>10/27 Zosyn 10/25 x 1 Cefepime 10/25>>present Daptomycin 10/27>>present Flagyl 10/27>>present    DVT prophylaxis: Eliquis Code Status: Full Family Communication: none present Disposition Plan: Status is: Inpatient  Remains inpatient appropriate because:Unsafe d/c plan, IV treatments appropriate due to intensity of illness or inability to take PO and Inpatient level of care appropriate due to severity of illness   Dispo: The patient is from: Home              Anticipated d/c is to: pending PT eval              Anticipated d/c date is: 2 days              Patient currently is not medically stable to d/c.  Objective: Blood pressure (!) 85/69, pulse 73, temperature 98.2 F (36.8 C), temperature source Oral, resp. rate 18, height $RemoveBe'5\' 9"'YUFDHKTaj$  (1.753 m), weight 124.7 kg, SpO2 97 %.  Examination: General appearance: unkempt elderly man laying in bed in NAD Head: Normocephalic, without obvious abnormality, atraumatic Eyes: EOMI Lungs: clear to auscultation bilaterally Heart: regular rate and rhythm and S1, S2 normal Abdomen: obese, soft, NT, ND, BS present Extremities: B/L LE edema noted. Feet are noted with severe necrotic  lesions ulcers on plantar surfaces. Right foot noted with large heel ulcer and ball of foot ulcer; legs are now wrapped in compression bandage Skin: mobility and turgor normal Neurologic: Grossly normal  Consultants:   Ortho  Procedures:   none  Data Reviewed: I have personally reviewed following labs and imaging studies Results for orders placed or performed during the hospital encounter of 09/08/20 (from the past 24 hour(s))  Glucose, capillary     Status: Abnormal   Collection Time: 09/10/20  4:32 PM  Result Value Ref Range   Glucose-Capillary 175 (H) 70 - 99 mg/dL  Glucose, capillary     Status: Abnormal   Collection Time: 09/10/20  9:44 PM  Result Value Ref Range   Glucose-Capillary 233 (H) 70 - 99 mg/dL  Basic metabolic panel     Status: Abnormal   Collection Time: 09/11/20  4:10 AM  Result Value Ref Range   Sodium 133 (L) 135 - 145 mmol/L   Potassium 4.0 3.5 - 5.1 mmol/L   Chloride 102 98 - 111 mmol/L   CO2 18 (L) 22 - 32 mmol/L   Glucose, Bld 164 (H) 70 - 99 mg/dL   BUN 56 (H) 8 - 23 mg/dL   Creatinine, Ser 2.78 (H) 0.61 - 1.24 mg/dL   Calcium 7.8 (L)  8.9 - 10.3 mg/dL   GFR, Estimated 24 (L) >60 mL/min   Anion gap 13 5 - 15  CBC with Differential/Platelet     Status: Abnormal   Collection Time: 09/11/20  4:10 AM  Result Value Ref Range   WBC 12.6 (H) 4.0 - 10.5 K/uL   RBC 4.26 4.22 - 5.81 MIL/uL   Hemoglobin 11.7 (L) 13.0 - 17.0 g/dL   HCT 37.2 (L) 39 - 52 %   MCV 87.3 80.0 - 100.0 fL   MCH 27.5 26.0 - 34.0 pg   MCHC 31.5 30.0 - 36.0 g/dL   RDW 14.4 11.5 - 15.5 %   Platelets 248 150 - 400 K/uL   nRBC 0.0 0.0 - 0.2 %   Neutrophils Relative % 77 %   Neutro Abs 9.9 (H) 1.7 - 7.7 K/uL   Lymphocytes Relative 9 %   Lymphs Abs 1.1 0.7 - 4.0 K/uL   Monocytes Relative 11 %   Monocytes Absolute 1.4 (H) 0.1 - 1.0 K/uL   Eosinophils Relative 1 %   Eosinophils Absolute 0.1 0.0 - 0.5 K/uL   Basophils Relative 1 %   Basophils Absolute 0.1 0.0 - 0.1 K/uL   Immature  Granulocytes 1 %   Abs Immature Granulocytes 0.09 (H) 0.00 - 0.07 K/uL  Magnesium     Status: Abnormal   Collection Time: 09/11/20  4:10 AM  Result Value Ref Range   Magnesium 1.6 (L) 1.7 - 2.4 mg/dL  Glucose, capillary     Status: Abnormal   Collection Time: 09/11/20  8:14 AM  Result Value Ref Range   Glucose-Capillary 159 (H) 70 - 99 mg/dL  Glucose, capillary     Status: Abnormal   Collection Time: 09/11/20 12:01 PM  Result Value Ref Range   Glucose-Capillary 156 (H) 70 - 99 mg/dL    Recent Results (from the past 240 hour(s))  Respiratory Panel by RT PCR (Flu A&B, Covid) - Nasopharyngeal Swab     Status: None   Collection Time: 09/08/20  5:13 PM   Specimen: Nasopharyngeal Swab  Result Value Ref Range Status   SARS Coronavirus 2 by RT PCR NEGATIVE NEGATIVE Final    Comment: (NOTE) SARS-CoV-2 target nucleic acids are NOT DETECTED.  The SARS-CoV-2 RNA is generally detectable in upper respiratoy specimens during the acute phase of infection. The lowest concentration of SARS-CoV-2 viral copies this assay can detect is 131 copies/mL. A negative result does not preclude SARS-Cov-2 infection and should not be used as the sole basis for treatment or other patient management decisions. A negative result may occur with  improper specimen collection/handling, submission of specimen other than nasopharyngeal swab, presence of viral mutation(s) within the areas targeted by this assay, and inadequate number of viral copies (<131 copies/mL). A negative result must be combined with clinical observations, patient history, and epidemiological information. The expected result is Negative.  Fact Sheet for Patients:  PinkCheek.be  Fact Sheet for Healthcare Providers:  GravelBags.it  This test is no t yet approved or cleared by the [place] FDA and  has been authorized for detection and/or diagnosis of SARS-CoV-2 by FDA under an  Emergency Use Authorization (EUA). This EUA will remain  in effect (meaning this test can be used) for the duration of the COVID-19 declaration under Section 564(b)(1) of the Act, 21 U.S.C. section 360bbb-3(b)(1), unless the authorization is terminated or revoked sooner.     Influenza A by PCR NEGATIVE NEGATIVE Final   Influenza B by PCR NEGATIVE NEGATIVE  Final    Comment: (NOTE) The Xpert Xpress SARS-CoV-2/FLU/RSV assay is intended as an aid in  the diagnosis of influenza from Nasopharyngeal swab specimens and  should not be used as a sole basis for treatment. Nasal washings and  aspirates are unacceptable for Xpert Xpress SARS-CoV-2/FLU/RSV  testing.  Fact Sheet for Patients: PinkCheek.be  Fact Sheet for Healthcare Providers: GravelBags.it  This test is not yet approved or cleared by the [place] FDA and  has been authorized for detection and/or diagnosis of SARS-CoV-2 by  FDA under an Emergency Use Authorization (EUA). This EUA will remain  in effect (meaning this test can be used) for the duration of the  Covid-19 declaration under Section 564(b)(1) of the Act, 21  U.S.C. section 360bbb-3(b)(1), unless the authorization is  terminated or revoked. Performed at [facility], [place] [address], [place], [place] [address]   Blood Cultures x 2 sites     Status: None (Preliminary result)   Collection Time: 09/08/20  7:04 PM   Specimen: BLOOD  Result Value Ref Range Status   Specimen Description   Final    BLOOD LEFT WRIST Performed at [facility] [address] [place], [place] [address]    Special Requests   Final    BOTTLES DRAWN AEROBIC AND ANAEROBIC Blood Culture adequate volume Performed at [facility] [address] [place], [place] [address]    Culture   Final    NO GROWTH 3 DAYS Performed at [facility], [place] [address] [place],  [place] [address]    Report Status PENDING  Incomplete  Culture, blood (x 2)     Status: None (Preliminary result)   Collection Time: 09/08/20  9:03 PM   Specimen: BLOOD LEFT HAND  Result Value Ref Range Status   Specimen Description   Final    BLOOD LEFT HAND Performed at [facility] [address] [place], [place] [address]    Special Requests   Final    BOTTLES DRAWN AEROBIC AND ANAEROBIC Blood Culture adequate volume Performed at [facility] [address] [place], [place] [address]    Culture   Final    NO GROWTH 3 DAYS Performed at [facility], [place] [address], [place], [place] [address]    Report Status PENDING  Incomplete     Radiology Studies: VAS [place] ABI WITH/WO TBI  Result Date: 09/10/2020 LOWER EXTREMITY DOPPLER STUDY Indications: Ulceration, and gangrene. High Risk Factors: Hypertension, Diabetes. Other Factors: Venous stasis dermatitis.  Limitations: Today's exam was limited due to significant wounds and extremely              thick, crusted skin throughout bilateral calves, ankles, and feet,              limiting/prohibiting the penetration of ultrasound signal. Comparison Study: 10/03/2019- ABI Performing Technologist: Maudry Mayhew MHA, RVT, RDCS, RDMS  Examination Guidelines: A complete evaluation includes at minimum, Doppler waveform signals and systolic blood pressure reading at the level of bilateral brachial, anterior tibial, and posterior tibial arteries, when vessel segments are accessible. Bilateral testing is considered an integral part of a complete examination. Photoelectric Plethysmograph (PPG) waveforms and toe systolic pressure readings are included as required and additional duplex testing as needed. Limited examinations for reoccurring indications may be performed as noted.  ABI Findings: +---------+------------------+-----+--------+----------------------------------+ Right    Rt Pressure  (mmHg)IndexWaveformComment                            +---------+------------------+-----+--------+----------------------------------+  Brachial                                Unable to evaluate due to IV                                               location                           +---------+------------------+-----+--------+----------------------------------+ PTA                                     Unable to insonate due to                                                  technical limitations              +---------+------------------+-----+--------+----------------------------------+ DP                              biphasicUnable to obtain pressure due to                                           technical limitations              +---------+------------------+-----+--------+----------------------------------+ Great Toe                       Present                                    +---------+------------------+-----+--------+----------------------------------+ +---------+------------------+-----+--------+----------------------------------+ Left     Lt Pressure (mmHg)IndexWaveformComment                            +---------+------------------+-----+--------+----------------------------------+ Brachial                                Not evaluated                      +---------+------------------+-----+--------+----------------------------------+ PTA                                     Unable to insonate due to                                                  technical limitations              +---------+------------------+-----+--------+----------------------------------+ DP  Unable to insonate due to                                                  technical limitations              +---------+------------------+-----+--------+----------------------------------+ Great Toe                        Present                                    +---------+------------------+-----+--------+----------------------------------+  Summary: Bilateral: Extremely limited exam due to technical limtiations. There is evidence of perfusion through to bilateral great toes, however unable to quantify due to technical limitations.  *See table(s) above for measurements and observations.  Electronically signed by Harold Barban MD on 09/10/2020 at 8:55:32 PM.    Final    VAS [place] ABI WITH/WO TBI  Final Result    DG Foot Complete Right  Final Result      Scheduled Meds: . (feeding supplement) PROSource Plus  30 mL Oral QID  . apixaban  5 mg Oral BID  . furosemide  40 mg Intravenous Daily  . insulin aspart  0-15 Units Subcutaneous TID WC  . insulin aspart  0-5 Units Subcutaneous QHS  . multivitamin with minerals  1 tablet Oral Daily   PRN Meds: acetaminophen **OR** acetaminophen, HYDROmorphone (DILAUDID) injection, labetalol, melatonin Continuous Infusions: . albumin human    . ceFEPime (MAXIPIME) IV 2 g (09/11/20 1100)  . DAPTOmycin (CUBICIN)  IV 740 mg (09/10/20 2324)  . metronidazole 500 mg (09/11/20 0408)     LOS: 3 days  Time spent: Greater than 50% of the 35 minute visit was spent in counseling/coordination of care for the patient as laid out in the A&P.   Dwyane Dee, MD [facility] 09/11/2020, 12:37 PM

## 2020-09-12 ENCOUNTER — Inpatient Hospital Stay (HOSPITAL_COMMUNITY): Payer: Medicare Other

## 2020-09-12 DIAGNOSIS — M869 Osteomyelitis, unspecified: Secondary | ICD-10-CM | POA: Diagnosis not present

## 2020-09-12 DIAGNOSIS — L8990 Pressure ulcer of unspecified site, unspecified stage: Secondary | ICD-10-CM | POA: Insufficient documentation

## 2020-09-12 LAB — GLUCOSE, CAPILLARY
Glucose-Capillary: 138 mg/dL — ABNORMAL HIGH (ref 70–99)
Glucose-Capillary: 181 mg/dL — ABNORMAL HIGH (ref 70–99)
Glucose-Capillary: 157 mg/dL — ABNORMAL HIGH (ref 70–99)
Glucose-Capillary: 152 mg/dL — ABNORMAL HIGH (ref 70–99)

## 2020-09-12 LAB — BASIC METABOLIC PANEL
Anion gap: 16 — ABNORMAL HIGH (ref 5–15)
BUN: 68 mg/dL — ABNORMAL HIGH (ref 8–23)
CO2: 15 mmol/L — ABNORMAL LOW (ref 22–32)
Calcium: 7.6 mg/dL — ABNORMAL LOW (ref 8.9–10.3)
Chloride: 100 mmol/L (ref 98–111)
Creatinine, Ser: 2.75 mg/dL — ABNORMAL HIGH (ref 0.61–1.24)
GFR, Estimated: 24 mL/min — ABNORMAL LOW (ref >60)
Glucose, Bld: 173 mg/dL — ABNORMAL HIGH (ref 70–99)
Potassium: 3.2 mmol/L — ABNORMAL LOW (ref 3.5–5.1)
Sodium: 131 mmol/L — ABNORMAL LOW (ref 135–145)

## 2020-09-12 LAB — CBC WITH DIFFERENTIAL/PLATELET
Abs Immature Granulocytes: 0.09 10*3/uL — ABNORMAL HIGH (ref 0.00–0.07)
Basophils Absolute: 0.1 10*3/uL (ref 0.0–0.1)
Basophils Relative: 1 %
Eosinophils Absolute: 0.1 10*3/uL (ref 0.0–0.5)
Eosinophils Relative: 1 %
HCT: 34.9 % — ABNORMAL LOW (ref 39.0–52.0)
Hemoglobin: 10.9 g/dL — ABNORMAL LOW (ref 13.0–17.0)
Immature Granulocytes: 1 %
Lymphocytes Relative: 13 %
Lymphs Abs: 1.4 10*3/uL (ref 0.7–4.0)
MCH: 27.1 pg (ref 26.0–34.0)
MCHC: 31.2 g/dL (ref 30.0–36.0)
MCV: 86.8 fL (ref 80.0–100.0)
Monocytes Absolute: 1.3 10*3/uL — ABNORMAL HIGH (ref 0.1–1.0)
Monocytes Relative: 12 %
Neutro Abs: 7.5 10*3/uL (ref 1.7–7.7)
Neutrophils Relative %: 72 %
Platelets: 246 10*3/uL (ref 150–400)
RBC: 4.02 MIL/uL — ABNORMAL LOW (ref 4.22–5.81)
RDW: 14.5 % (ref 11.5–15.5)
WBC: 10.4 10*3/uL (ref 4.0–10.5)
nRBC: 0 % (ref 0.0–0.2)

## 2020-09-12 LAB — MAGNESIUM: Magnesium: 1.7 mg/dL (ref 1.7–2.4)

## 2020-09-12 MED ORDER — DOXYCYCLINE HYCLATE 100 MG PO TABS
100.0000 mg | ORAL_TABLET | Freq: Two times a day (BID) | ORAL | Status: DC
  Administered 2020-09-12 – 2020-10-01 (×39): 100 mg via ORAL
  Filled 2020-09-12 (×40): qty 1

## 2020-09-12 MED ORDER — AMOXICILLIN-POT CLAVULANATE 875-125 MG PO TABS
1.0000 | ORAL_TABLET | Freq: Two times a day (BID) | ORAL | Status: DC
  Administered 2020-09-12 – 2020-09-14 (×5): 1 via ORAL
  Filled 2020-09-12 (×5): qty 1

## 2020-09-12 MED ORDER — POTASSIUM CHLORIDE CRYS ER 20 MEQ PO TBCR
40.0000 meq | EXTENDED_RELEASE_TABLET | Freq: Once | ORAL | Status: AC
  Administered 2020-09-12: 40 meq via ORAL
  Filled 2020-09-12: qty 2

## 2020-09-12 MED ORDER — MAGNESIUM OXIDE 400 (241.3 MG) MG PO TABS
800.0000 mg | ORAL_TABLET | Freq: Once | ORAL | Status: AC
  Administered 2020-09-12: 800 mg via ORAL
  Filled 2020-09-12: qty 2

## 2020-09-12 NOTE — Plan of Care (Signed)
  Problem: Education: Goal: Knowledge of General Education information will improve Description: Including pain rating scale, medication(s)/side effects and non-pharmacologic comfort measures Outcome: Progressing   Problem: Health Behavior/Discharge Planning: Goal: Ability to manage health-related needs will improve Outcome: Progressing   Problem: Clinical Measurements: Goal: Ability to maintain clinical measurements within normal limits will improve Outcome: Progressing Goal: Will remain free from infection Outcome: Progressing Goal: Diagnostic test results will improve Outcome: Progressing Goal: Cardiovascular complication will be avoided Outcome: Progressing   Problem: Activity: Goal: Risk for activity intolerance will decrease Outcome: Progressing   Problem: Coping: Goal: Level of anxiety will decrease Outcome: Progressing   Problem: Elimination: Goal: Will not experience complications related to bowel motility Outcome: Progressing Goal: Will not experience complications related to urinary retention Outcome: Progressing   Problem: Pain Managment: Goal: General experience of comfort will improve Outcome: Progressing   Problem: Safety: Goal: Ability to remain free from injury will improve Outcome: Progressing   Problem: Skin Integrity: Goal: Risk for impaired skin integrity will decrease Outcome: Progressing   Problem: Education: Goal: Ability to describe self-care measures that may prevent or decrease complications (Diabetes Survival Skills Education) will improve Outcome: Progressing   Problem: Coping: Goal: Ability to adjust to condition or change in health will improve Outcome: Progressing   Problem: Fluid Volume: Goal: Ability to maintain a balanced intake and output will improve Outcome: Progressing   Problem: Health Behavior/Discharge Planning: Goal: Ability to identify and utilize available resources and services will improve Outcome:  Progressing Goal: Ability to manage health-related needs will improve Outcome: Progressing   Problem: Metabolic: Goal: Ability to maintain appropriate glucose levels will improve Outcome: Progressing   Problem: Nutritional: Goal: Maintenance of adequate nutrition will improve Outcome: Progressing   Problem: Skin Integrity: Goal: Risk for impaired skin integrity will decrease Outcome: Progressing

## 2020-09-12 NOTE — Progress Notes (Signed)
Night provider notified pt seating on edge of bed -SOB, afib HR and need for more frequent leg dressing changes as they are saturated with fluid weeping from legs.  If HR sustains over 120 and bp stable will give PRN labetalol. Provider ordered stat chest xray.

## 2020-09-12 NOTE — Progress Notes (Signed)
C/O has to have BM, Demanding has to get up to [facility]. OOB to [facility]. No BM. Verbal aggressive. Back to bed.

## 2020-09-12 NOTE — Progress Notes (Signed)
Stayed in chair  One hour states has to use BR To bedside commode had BM. Back to bed.

## 2020-09-12 NOTE — Progress Notes (Signed)
PROGRESS NOTE    [place].   KDX:[id number]  DOB: 05/19/1951  DOA: 09/08/2020     4  PCP: Aura Dials, MD  CC: found down at home  Hospital Course: [name] is a 69 y.o. male with medical history significant of DM2, dCHF, PAF (supposed to be on Eliquis), HTN who presented to ED after being found down in the bathroom during welfare check.   Per report, patient noted to have an unsanitary living condition and is currently followed by adult protective services. Patient reports falling while standing up from bathroom without LOC, resulting in fall onto R chest and resultant R lower chest pain. Patient was found down and brought into ED.    Of note, patient reports stopping all medications several months ago with exception of torsemide because "I didn't think I needed them anymore." Pt has only been taking torsemide PRN swelling.  He was found to have necrotic foot infections and underwent xray of right foot, which revealed subcortical lucency involving 5th digit concerning for OM.  He was started on vancomycin and cefepime and orthopedic surgery was consulted. ABIs were also ordered.  Test was inconclusive due to severe lower extremity edema.   Initially patient was also against surgical treatment options despite severity of his wounds, their obvious chronicity, and inability to explain consequences for avoiding surgery. We requested psychiatry to evaluate patient for decision making ability in this setting. After repeat explanation of his diagnosis and possible treatment options he was able to voice adequate understanding to psychiatry and was considered to have decision making capacity regarding his current infection.   He will need to continue with serial compression wrappings at discharge in efforts to promote better chance of wound healing and proper assessment of ABIs to help decide best treatment course.   Given his significant deconditioning and after discussion with  his daughter, there is concern for his ability to safely return home due to poor living situation; Physical therapy was consulted. After PT evaluation he was recommended for SNF at discharge.  He was accepting of this and placement was pursued.  Overall, he had good clinical response to IV antibiotics.  He was deescalated to Augmentin and doxycycline to finish out a course. He will continue with serial compression wrappings at discharge to rehab as well as following up with Ortho for further management.   Interval History:  No events overnight.  Sitting on the edge of the bed this morning eating breakfast.  He is planning for going to rehab and is open to the idea.  Understands plan is for oral antibiotics with outpatient follow-up with orthopedic surgery.  Old records reviewed in assessment of this patient  ROS: Constitutional: negative for chills and fevers, Respiratory: negative for cough, Cardiovascular: negative for chest pain and Gastrointestinal: negative for abdominal pain  Assessment & Plan: * Osteomyelitis of fifth toe of right foot ([place]) - probable OM per xray; ABIs indeterminate due to LE edema - patient against inpatient surgery at this time regardless - patient needs serial compression wrappings to LE to aid with improving edema - CRP 8.2, ESR 55. Will serve as baseline going forward  -Outpatient follow-up with orthopedic surgery -Discontinue IV antibiotics.  Start Augmentin and doxycycline.  will continue for 2-week course  Severe sepsis (HCC)-resolved as of 09/12/2020 -Tachycardic, tachypneic, elevated WBC, elevated lactic acid.  Source considered right foot -See osteomyelitis above  Chronic edema -Continue lower extremity compression wrappings.  Will follow up if able to set  him up with outpatient wound care center versus home health or combination -This is preventing adequate ABI evaluation  Atrial fibrillation with RVR (HCC) - RVR considered in setting of sepsis on  admission - responded to BB (patient noncompliant at home) - started on heparin drip on admission; given no upcoming surgery at this point, d/c heparin drip and resume Eliquis (will need to counsel patient on compliance at d/c once again)  Acute renal failure superimposed on stage 3a chronic kidney disease ([place]) - baseline creatinine approx 1.2 - 1.3 - creatinine 1.98 on admission which has uptrended - creatinine uptrending still; will give trial of albumin with lasix - monitor renal function on diuresis; urine output has picked up some.  Continue trending creatinine  Acute on chronic heart failure with preserved ejection fraction (HFpEF) ([place]) Most recent 2d echo from 3/21 with EF of 40-45%, unable to evaluate diastolic dysfunction at that time - continue lasix; albumin 2.4; given uptrending creatining with severe edema, will give trial of albumin with lasix   Essential hypertension - continue current regimen   Type 2 diabetes mellitus (HCC) - A1c 9.6% - continue SSI and CBG for now - patient also noncompliant with regimen at home; states "I didn't think I need it", he is now amenable to restarting medications as prescribed   Antimicrobials: Vanc 10/25>>10/27 Zosyn 10/25 x 1 Cefepime 10/25>> 09/12/2020 Daptomycin 10/27>> 09/12/2020 Flagyl 10/27>> 09/12/2020 Augmentin 09/12/2020>> present Doxycycline 09/12/2020>> present   DVT prophylaxis: Eliquis Code Status: Full Family Communication: none present Disposition Plan: Status is: Inpatient  Remains inpatient appropriate because:Unsafe d/c plan, IV treatments appropriate due to intensity of illness or inability to take PO and Inpatient level of care appropriate due to severity of illness   Dispo: The patient is from: Home              Anticipated d/c is to: SNF              Anticipated d/c date is: 2 days              Patient currently is not medically stable to d/c.  Objective: Blood pressure 108/69, pulse (!) 105,  temperature 97.6 F (36.4 C), temperature source Oral, resp. rate 18, height _0  (1.753 m), weight 124.7 kg, SpO2 97 %.  Examination: General appearance: Pleasant elderly man sitting on edge of bed in no distress eating breakfast Head: Normocephalic, without obvious abnormality, atraumatic Eyes: EOMI Lungs: clear to auscultation bilaterally Heart: regular rate and rhythm and S1, S2 normal Abdomen: obese, soft, NT, ND, BS present Extremities: B/L LE edema noted. Feet are noted with severe necrotic lesions ulcers on plantar surfaces. Right foot noted with large heel ulcer and ball of foot ulcer; legs are now wrapped in compression bandage Skin: mobility and turgor normal Neurologic: Grossly normal  Consultants:   Ortho  Procedures:   none  Data Reviewed: I have personally reviewed following labs and imaging studies Results for orders placed or performed during the hospital encounter of 09/08/20 (from the past 24 hour(s))  Glucose, capillary     Status: Abnormal   Collection Time: 09/11/20  5:38 PM  Result Value Ref Range   Glucose-Capillary 148 (H) 70 - 99 mg/dL   Comment 1 Notify RN    Comment 2 Document in Chart   Glucose, capillary     Status: Abnormal   Collection Time: 09/11/20  8:08 PM  Result Value Ref Range   Glucose-Capillary 109 (H) 70 - 99 mg/dL  Basic  metabolic panel     Status: Abnormal   Collection Time: 09/12/20  4:16 AM  Result Value Ref Range   Sodium 131 (L) 135 - 145 mmol/L   Potassium 3.2 (L) 3.5 - 5.1 mmol/L   Chloride 100 98 - 111 mmol/L   CO2 15 (L) 22 - 32 mmol/L   Glucose, Bld 173 (H) 70 - 99 mg/dL   BUN 68 (H) 8 - 23 mg/dL   Creatinine, Ser 2.75 (H) 0.61 - 1.24 mg/dL   Calcium 7.6 (L) 8.9 - 10.3 mg/dL   GFR, Estimated 24 (L) >60 mL/min   Anion gap 16 (H) 5 - 15  CBC with Differential/Platelet     Status: Abnormal   Collection Time: 09/12/20  4:16 AM  Result Value Ref Range   WBC 10.4 4.0 - 10.5 K/uL   RBC 4.02 (L) 4.22 - 5.81 MIL/uL    Hemoglobin 10.9 (L) 13.0 - 17.0 g/dL   HCT 34.9 (L) 39 - 52 %   MCV 86.8 80.0 - 100.0 fL   MCH 27.1 26.0 - 34.0 pg   MCHC 31.2 30.0 - 36.0 g/dL   RDW 14.5 11.5 - 15.5 %   Platelets 246 150 - 400 K/uL   nRBC 0.0 0.0 - 0.2 %   Neutrophils Relative % 72 %   Neutro Abs 7.5 1.7 - 7.7 K/uL   Lymphocytes Relative 13 %   Lymphs Abs 1.4 0.7 - 4.0 K/uL   Monocytes Relative 12 %   Monocytes Absolute 1.3 (H) 0.1 - 1.0 K/uL   Eosinophils Relative 1 %   Eosinophils Absolute 0.1 0.0 - 0.5 K/uL   Basophils Relative 1 %   Basophils Absolute 0.1 0.0 - 0.1 K/uL   Immature Granulocytes 1 %   Abs Immature Granulocytes 0.09 (H) 0.00 - 0.07 K/uL  Magnesium     Status: None   Collection Time: 09/12/20  4:16 AM  Result Value Ref Range   Magnesium 1.7 1.7 - 2.4 mg/dL  Glucose, capillary     Status: Abnormal   Collection Time: 09/12/20  7:30 AM  Result Value Ref Range   Glucose-Capillary 138 (H) 70 - 99 mg/dL  Glucose, capillary     Status: Abnormal   Collection Time: 09/12/20 11:38 AM  Result Value Ref Range   Glucose-Capillary 181 (H) 70 - 99 mg/dL    Recent Results (from the past 240 hour(s))  Respiratory Panel by RT PCR (Flu A&B, Covid) - Nasopharyngeal Swab     Status: None   Collection Time: 09/08/20  5:13 PM   Specimen: Nasopharyngeal Swab  Result Value Ref Range Status   SARS Coronavirus 2 by RT PCR NEGATIVE NEGATIVE Final    Comment: (NOTE) SARS-CoV-2 target nucleic acids are NOT DETECTED.  The SARS-CoV-2 RNA is generally detectable in upper respiratoy specimens during the acute phase of infection. The lowest concentration of SARS-CoV-2 viral copies this assay can detect is 131 copies/mL. A negative result does not preclude SARS-Cov-2 infection and should not be used as the sole basis for treatment or other patient management decisions. A negative result may occur with  improper specimen collection/handling, submission of specimen other than nasopharyngeal swab, presence of viral  mutation(s) within the areas targeted by this assay, and inadequate number of viral copies (<131 copies/mL). A negative result must be combined with clinical observations, patient history, and epidemiological information. The expected result is Negative.  Fact Sheet for Patients:  PinkCheek.be  Fact Sheet for Healthcare Providers:  GravelBags.it  This  test is no t yet approved or cleared by the [place] and  has been authorized for detection and/or diagnosis of SARS-CoV-2 by FDA under an Emergency Use Authorization (EUA). This EUA will remain  in effect (meaning this test can be used) for the duration of the COVID-19 declaration under Section 564(b)(1) of the Act, 21 U.S.C. section 360bbb-3(b)(1), unless the authorization is terminated or revoked sooner.     Influenza A by PCR NEGATIVE NEGATIVE Final   Influenza B by PCR NEGATIVE NEGATIVE Final    Comment: (NOTE) The Xpert Xpress SARS-CoV-2/FLU/RSV assay is intended as an aid in  the diagnosis of influenza from Nasopharyngeal swab specimens and  should not be used as a sole basis for treatment. Nasal washings and  aspirates are unacceptable for Xpert Xpress SARS-CoV-2/FLU/RSV  testing.  Fact Sheet for Patients: PinkCheek.be  Fact Sheet for Healthcare Providers: GravelBags.it  This test is not yet approved or cleared by the [place] FDA and  has been authorized for detection and/or diagnosis of SARS-CoV-2 by  FDA under an Emergency Use Authorization (EUA). This EUA will remain  in effect (meaning this test can be used) for the duration of the  Covid-19 declaration under Section 564(b)(1) of the Act, 21  U.S.C. section 360bbb-3(b)(1), unless the authorization is  terminated or revoked. Performed at [facility], [place] [address], [place], [place] [address]   Blood Cultures x 2  sites     Status: None (Preliminary result)   Collection Time: 09/08/20  7:04 PM   Specimen: BLOOD  Result Value Ref Range Status   Specimen Description   Final    BLOOD LEFT WRIST Performed at [facility] [address] [place], [place] [address]    Special Requests   Final    BOTTLES DRAWN AEROBIC AND ANAEROBIC Blood Culture adequate volume Performed at [facility] [address] [place], [place] [address]    Culture   Final    NO GROWTH 4 DAYS Performed at [facility], [place] [address], [place], [place] [address]    Report Status PENDING  Incomplete  Culture, blood (x 2)     Status: None (Preliminary result)   Collection Time: 09/08/20  9:03 PM   Specimen: BLOOD LEFT HAND  Result Value Ref Range Status   Specimen Description   Final    BLOOD LEFT HAND Performed at [facility] [address] [place], [place] [address]    Special Requests   Final    BOTTLES DRAWN AEROBIC AND ANAEROBIC Blood Culture adequate volume Performed at [facility] [address] [place], [place] [address]    Culture   Final    NO GROWTH 4 DAYS Performed at [facility], [place] [address], [place], [place] [address]    Report Status PENDING  Incomplete     Radiology Studies: No results found. VAS [place] ABI WITH/WO TBI  Final Result    DG Foot Complete Right  Final Result      Scheduled Meds: . (feeding supplement) PROSource Plus  30 mL Oral QID  . amoxicillin-clavulanate  1 tablet Oral Q12H  . apixaban  5 mg Oral BID  . doxycycline  100 mg Oral Q12H  . furosemide  40 mg Intravenous Daily  . insulin aspart  0-15 Units Subcutaneous TID WC  . insulin aspart  0-5 Units Subcutaneous QHS  . multivitamin with minerals  1 tablet Oral Daily  PRN Meds: acetaminophen **OR** acetaminophen, HYDROmorphone (DILAUDID) injection, labetalol, melatonin Continuous Infusions: . albumin human 25 g (09/12/20 1337)       LOS: 4 days  Time spent: Greater than 50% of the 35 minute visit was spent in counseling/coordination of care for the patient as laid out in the A&P.   Dwyane Dee, MD [facility] 09/12/2020, 3:29 PM

## 2020-09-12 NOTE — TOC Progression Note (Addendum)
Transition of Care (TOC) - Progression Note    Patient Details  Name: [name]. MRN: [id number] Date of Birth: [birth year]  Transition of Care [facility]) CM/SW Contact  Ross Ludwig, [place] Phone Number: 09/12/2020, 11:01 AM  Clinical Narrative:     CSW spoke to patient's daughter [name] and presented bed offers.  Patient's daughter agreed to patient going to [facility] for short term rehab.  CSW spoke to [facility], they can accept patient on Sunday pending insurance authorization.  CSW started insurance authorization and updated attending physician.  CSW to continue to follow patient's progress throughout discharge planning.  3:30pm  CSW received phone call from patient's insurance company that he has been approved for SNF placement with a start date of 09/14/20.  Auth number is [id number].  Expected Discharge Plan: Home/Self Care Barriers to Discharge: Continued Medical Work up  Expected Discharge Plan and Services Expected Discharge Plan: Home/Self Care In-house Referral: Clinical Social Work     Living arrangements for the past 2 months: Single Family Home                                       Social Determinants of Health (SDOH) Interventions    Readmission Risk Interventions No flowsheet data found.

## 2020-09-13 DIAGNOSIS — N1831 Chronic kidney disease, stage 3a: Secondary | ICD-10-CM | POA: Diagnosis not present

## 2020-09-13 DIAGNOSIS — M869 Osteomyelitis, unspecified: Secondary | ICD-10-CM | POA: Diagnosis not present

## 2020-09-13 DIAGNOSIS — N179 Acute kidney failure, unspecified: Secondary | ICD-10-CM | POA: Diagnosis not present

## 2020-09-13 LAB — CBC WITH DIFFERENTIAL/PLATELET
Abs Immature Granulocytes: 0.1 10*3/uL — ABNORMAL HIGH (ref 0.00–0.07)
Basophils Absolute: 0.1 10*3/uL (ref 0.0–0.1)
Basophils Relative: 1 %
Eosinophils Absolute: 0.2 10*3/uL (ref 0.0–0.5)
Eosinophils Relative: 2 %
HCT: 37.2 % — ABNORMAL LOW (ref 39.0–52.0)
Hemoglobin: 11.2 g/dL — ABNORMAL LOW (ref 13.0–17.0)
Immature Granulocytes: 1 %
Lymphocytes Relative: 13 %
Lymphs Abs: 1.4 10*3/uL (ref 0.7–4.0)
MCH: 26.7 pg (ref 26.0–34.0)
MCHC: 30.1 g/dL (ref 30.0–36.0)
MCV: 88.8 fL (ref 80.0–100.0)
Monocytes Absolute: 1 10*3/uL (ref 0.1–1.0)
Monocytes Relative: 10 %
Neutro Abs: 8 10*3/uL — ABNORMAL HIGH (ref 1.7–7.7)
Neutrophils Relative %: 73 %
Platelets: 286 10*3/uL (ref 150–400)
RBC: 4.19 MIL/uL — ABNORMAL LOW (ref 4.22–5.81)
RDW: 14.6 % (ref 11.5–15.5)
WBC: 10.8 10*3/uL — ABNORMAL HIGH (ref 4.0–10.5)
nRBC: 0 % (ref 0.0–0.2)

## 2020-09-13 LAB — BASIC METABOLIC PANEL
Anion gap: 15 (ref 5–15)
BUN: 80 mg/dL — ABNORMAL HIGH (ref 8–23)
CO2: 17 mmol/L — ABNORMAL LOW (ref 22–32)
Calcium: 7.9 mg/dL — ABNORMAL LOW (ref 8.9–10.3)
Chloride: 104 mmol/L (ref 98–111)
Creatinine, Ser: 2.99 mg/dL — ABNORMAL HIGH (ref 0.61–1.24)
GFR, Estimated: 22 mL/min — ABNORMAL LOW (ref >60)
Glucose, Bld: 143 mg/dL — ABNORMAL HIGH (ref 70–99)
Potassium: 3.5 mmol/L (ref 3.5–5.1)
Sodium: 136 mmol/L (ref 135–145)

## 2020-09-13 LAB — CULTURE, BLOOD (ROUTINE X 2)
Culture: NO GROWTH
Culture: NO GROWTH
Special Requests: ADEQUATE
Special Requests: ADEQUATE

## 2020-09-13 LAB — GLUCOSE, CAPILLARY
Glucose-Capillary: 131 mg/dL — ABNORMAL HIGH (ref 70–99)
Glucose-Capillary: 157 mg/dL — ABNORMAL HIGH (ref 70–99)
Glucose-Capillary: 183 mg/dL — ABNORMAL HIGH (ref 70–99)
Glucose-Capillary: 206 mg/dL — ABNORMAL HIGH (ref 70–99)

## 2020-09-13 LAB — MAGNESIUM: Magnesium: 1.9 mg/dL (ref 1.7–2.4)

## 2020-09-13 MED ORDER — FUROSEMIDE 10 MG/ML IJ SOLN
40.0000 mg | Freq: Once | INTRAMUSCULAR | Status: AC
  Administered 2020-09-13: 40 mg via INTRAVENOUS
  Filled 2020-09-13: qty 4

## 2020-09-13 MED ORDER — FUROSEMIDE 10 MG/ML IJ SOLN
40.0000 mg | Freq: Two times a day (BID) | INTRAMUSCULAR | Status: DC
  Administered 2020-09-13 (×2): 40 mg via INTRAVENOUS
  Filled 2020-09-13 (×2): qty 4

## 2020-09-13 NOTE — Progress Notes (Addendum)
Bed alarm off currently due to bed alarming continuously due to pt position in bed (reverse trendelenburg) to help leg pain. Pt wants to sit up and place feet on floor for pain relief and to help w SOB. He has not got any sleep tonight and is restless.   Melatonin, tylenol, and dilaudid given with no relief reports pt.  One lasix dose given per order. No UO after lasix unless it was mixed in stool. Pt unsure if he urinated.   Night shift provider notified 30 beats vtach 0612 am 09/13/20, vs stable , pt asymptomatic

## 2020-09-13 NOTE — Progress Notes (Signed)
PROGRESS NOTE    [name].   [name]  DOB: [birth year]  DOA: 09/08/2020     5  PCP: Aura Dials, MD  CC: found down at home  Hospital Course: [name] is a 69 y.o. male with medical history significant of DM2, dCHF, PAF (supposed to be on Eliquis), HTN who presented to ED after being found down in the bathroom during welfare check.   Per report, patient noted to have an unsanitary living condition and is currently followed by adult protective services. Patient reports falling while standing up from bathroom without LOC, resulting in fall onto R chest and resultant R lower chest pain. Patient was found down and brought into ED.    Of note, patient reports stopping all medications several months ago with exception of torsemide because "I didn't think I needed them anymore." Pt has only been taking torsemide PRN swelling.  He was found to have necrotic foot infections and underwent xray of right foot, which revealed subcortical lucency involving 5th digit concerning for OM.  He was started on vancomycin and cefepime and orthopedic surgery was consulted. ABIs were also ordered.  Test was inconclusive due to severe lower extremity edema.   Initially patient was also against surgical treatment options despite severity of his wounds, their obvious chronicity, and inability to explain consequences for avoiding surgery. We requested psychiatry to evaluate patient for decision making ability in this setting. After repeat explanation of his diagnosis and possible treatment options he was able to voice adequate understanding to psychiatry and was considered to have decision making capacity regarding his current infection.   He will need to continue with serial compression wrappings at discharge in efforts to promote better chance of wound healing and proper assessment of ABIs to help decide best treatment course.   Given his significant deconditioning and after discussion with  his daughter, there is concern for his ability to safely return home due to poor living situation; Physical therapy was consulted. After PT evaluation he was recommended for SNF at discharge.  He was accepting of this and placement was pursued.  Overall, he had good clinical response to IV antibiotics.  He was deescalated to Augmentin and doxycycline to finish out a course. He will continue with serial compression wrappings at discharge to rehab as well as following up with Ortho for further management.   Interval History:  Patient was restless overnight but was thankful for the responsive staff. He could not get comfortable he says.  Leg wrappings have been weeping some from compression. He thinks he is voiding well but cannot tell when he's voiding when having a BM.   Old records reviewed in assessment of this patient  ROS: Constitutional: negative for chills and fevers, Respiratory: negative for cough, Cardiovascular: negative for chest pain and Gastrointestinal: negative for abdominal pain  Assessment & Plan: * Osteomyelitis of fifth toe of right foot ([place]) - probable OM per xray; ABIs indeterminate due to LE edema - patient against inpatient surgery at this time regardless - patient needs serial compression wrappings to LE to aid with improving edema - CRP 8.2, ESR 55. Will serve as baseline going forward  -Outpatient follow-up with orthopedic surgery -Discontinue IV antibiotics.  Start Augmentin and doxycycline.  will continue for 2-week course  Acute renal failure superimposed on stage 3a chronic kidney disease (HCC) - baseline creatinine approx 1.2 - 1.3 - creatinine 1.98 on admission which has uptrended - creatinine uptrending still; no improvement yet with alb/lasix -  increase lasix to BID - strict I&O - repeat BMP in am  Severe sepsis (HCC)-resolved as of 09/12/2020 -Tachycardic, tachypneic, elevated WBC, elevated lactic acid.  Source considered right foot -See  osteomyelitis above  Chronic edema -Continue lower extremity compression wrappings.  Will follow up if able to set him up with outpatient wound care center versus home health or combination -This is preventing adequate ABI evaluation  Atrial fibrillation with RVR ([place]) - RVR considered in setting of sepsis on admission - responded to BB (patient noncompliant at home) - started on heparin drip on admission; given no upcoming surgery at this point, d/c heparin drip and resume Eliquis (will need to counsel patient on compliance at d/c once again)  Acute on chronic heart failure with preserved ejection fraction (HFpEF) ([place]) Most recent 2d echo from 3/21 with EF of 40-45%, unable to evaluate diastolic dysfunction at that time - continue lasix; albumin 2.4; given uptrending creatining with severe edema, will give trial of albumin with lasix   Essential hypertension - continue current regimen   Type 2 diabetes mellitus ([place]) - A1c 9.6% - continue SSI and CBG for now - patient also noncompliant with regimen at home; states "I didn't think I need it", he is now amenable to restarting medications as prescribed   Antimicrobials: Vanc 10/25>>10/27 Zosyn 10/25 x 1 Cefepime 10/25>> 09/12/2020 Daptomycin 10/27>> 09/12/2020 Flagyl 10/27>> 09/12/2020 Augmentin 09/12/2020>> present Doxycycline 09/12/2020>> present   DVT prophylaxis: Eliquis Code Status: Full Family Communication: none present Disposition Plan: Status is: Inpatient  Remains inpatient appropriate because:Unsafe d/c plan, IV treatments appropriate due to intensity of illness or inability to take PO and Inpatient level of care appropriate due to severity of illness   Dispo: The patient is from: Home              Anticipated d/c is to: SNF              Anticipated d/c date is: 2 days. Rehab possibly Sunday or Monday depending on renal fxn improving              Patient currently is not medically stable to  d/c.  Objective: Blood pressure 105/79, pulse (!) 57, temperature 97.6 F (36.4 C), temperature source Oral, resp. rate (!) 22, height _0  (1.753 m), weight 124.7 kg, SpO2 98 %.  Examination: General appearance: Pleasant elderly man sitting on edge of bed in no distress eating breakfast Head: Normocephalic, without obvious abnormality, atraumatic Eyes: EOMI Lungs: clear to auscultation bilaterally Heart: regular rate and rhythm and S1, S2 normal Abdomen: obese, soft, NT, ND, BS present Extremities: B/L LE edema noted. Feet are noted with severe necrotic lesions ulcers on plantar surfaces. Right foot noted with large heel ulcer and ball of foot ulcer; legs are now wrapped in compression bandage Skin: mobility and turgor normal Neurologic: Grossly normal  Consultants:   Ortho  Procedures:   none  Data Reviewed: I have personally reviewed following labs and imaging studies Results for orders placed or performed during the hospital encounter of 09/08/20 (from the past 24 hour(s))  Glucose, capillary     Status: Abnormal   Collection Time: 09/12/20  5:20 PM  Result Value Ref Range   Glucose-Capillary 157 (H) 70 - 99 mg/dL  Glucose, capillary     Status: Abnormal   Collection Time: 09/12/20  9:33 PM  Result Value Ref Range   Glucose-Capillary 152 (H) 70 - 99 mg/dL  Basic metabolic panel     Status: Abnormal  Collection Time: 09/13/20  5:26 AM  Result Value Ref Range   Sodium 136 135 - 145 mmol/L   Potassium 3.5 3.5 - 5.1 mmol/L   Chloride 104 98 - 111 mmol/L   CO2 17 (L) 22 - 32 mmol/L   Glucose, Bld 143 (H) 70 - 99 mg/dL   BUN 80 (H) 8 - 23 mg/dL   Creatinine, Ser 2.99 (H) 0.61 - 1.24 mg/dL   Calcium 7.9 (L) 8.9 - 10.3 mg/dL   GFR, Estimated 22 (L) >60 mL/min   Anion gap 15 5 - 15  CBC with Differential/Platelet     Status: Abnormal   Collection Time: 09/13/20  5:26 AM  Result Value Ref Range   WBC 10.8 (H) 4.0 - 10.5 K/uL   RBC 4.19 (L) 4.22 - 5.81 MIL/uL    Hemoglobin 11.2 (L) 13.0 - 17.0 g/dL   HCT 37.2 (L) 39 - 52 %   MCV 88.8 80.0 - 100.0 fL   MCH 26.7 26.0 - 34.0 pg   MCHC 30.1 30.0 - 36.0 g/dL   RDW 14.6 11.5 - 15.5 %   Platelets 286 150 - 400 K/uL   nRBC 0.0 0.0 - 0.2 %   Neutrophils Relative % 73 %   Neutro Abs 8.0 (H) 1.7 - 7.7 K/uL   Lymphocytes Relative 13 %   Lymphs Abs 1.4 0.7 - 4.0 K/uL   Monocytes Relative 10 %   Monocytes Absolute 1.0 0.1 - 1.0 K/uL   Eosinophils Relative 2 %   Eosinophils Absolute 0.2 0.0 - 0.5 K/uL   Basophils Relative 1 %   Basophils Absolute 0.1 0.0 - 0.1 K/uL   Immature Granulocytes 1 %   Abs Immature Granulocytes 0.10 (H) 0.00 - 0.07 K/uL  Magnesium     Status: None   Collection Time: 09/13/20  5:26 AM  Result Value Ref Range   Magnesium 1.9 1.7 - 2.4 mg/dL  Glucose, capillary     Status: Abnormal   Collection Time: 09/13/20  7:45 AM  Result Value Ref Range   Glucose-Capillary 131 (H) 70 - 99 mg/dL  Glucose, capillary     Status: Abnormal   Collection Time: 09/13/20 12:30 PM  Result Value Ref Range   Glucose-Capillary 157 (H) 70 - 99 mg/dL    Recent Results (from the past 240 hour(s))  Respiratory Panel by RT PCR (Flu A&B, Covid) - Nasopharyngeal Swab     Status: None   Collection Time: 09/08/20  5:13 PM   Specimen: Nasopharyngeal Swab  Result Value Ref Range Status   SARS Coronavirus 2 by RT PCR NEGATIVE NEGATIVE Final    Comment: (NOTE) SARS-CoV-2 target nucleic acids are NOT DETECTED.  The SARS-CoV-2 RNA is generally detectable in upper respiratoy specimens during the acute phase of infection. The lowest concentration of SARS-CoV-2 viral copies this assay can detect is 131 copies/mL. A negative result does not preclude SARS-Cov-2 infection and should not be used as the sole basis for treatment or other patient management decisions. A negative result may occur with  improper specimen collection/handling, submission of specimen other than nasopharyngeal swab, presence of viral  mutation(s) within the areas targeted by this assay, and inadequate number of viral copies (<131 copies/mL). A negative result must be combined with clinical observations, patient history, and epidemiological information. The expected result is Negative.  Fact Sheet for Patients:  PinkCheek.be  Fact Sheet for Healthcare Providers:  GravelBags.it  This test is no t yet approved or cleared by the [place]  FDA and  has been authorized for detection and/or diagnosis of SARS-CoV-2 by FDA under an Emergency Use Authorization (EUA). This EUA will remain  in effect (meaning this test can be used) for the duration of the COVID-19 declaration under Section 564(b)(1) of the Act, 21 U.S.C. section 360bbb-3(b)(1), unless the authorization is terminated or revoked sooner.     Influenza A by PCR NEGATIVE NEGATIVE Final   Influenza B by PCR NEGATIVE NEGATIVE Final    Comment: (NOTE) The Xpert Xpress SARS-CoV-2/FLU/RSV assay is intended as an aid in  the diagnosis of influenza from Nasopharyngeal swab specimens and  should not be used as a sole basis for treatment. Nasal washings and  aspirates are unacceptable for Xpert Xpress SARS-CoV-2/FLU/RSV  testing.  Fact Sheet for Patients: PinkCheek.be  Fact Sheet for Healthcare Providers: GravelBags.it  This test is not yet approved or cleared by the [place] FDA and  has been authorized for detection and/or diagnosis of SARS-CoV-2 by  FDA under an Emergency Use Authorization (EUA). This EUA will remain  in effect (meaning this test can be used) for the duration of the  Covid-19 declaration under Section 564(b)(1) of the Act, 21  U.S.C. section 360bbb-3(b)(1), unless the authorization is  terminated or revoked. Performed at [facility], [place] [address] [place], [place] [address]   Blood Cultures x 2  sites     Status: None   Collection Time: 09/08/20  7:04 PM   Specimen: BLOOD  Result Value Ref Range Status   Specimen Description   Final    BLOOD LEFT WRIST Performed at [facility] [address] [place], [place] [address]    Special Requests   Final    BOTTLES DRAWN AEROBIC AND ANAEROBIC Blood Culture adequate volume Performed at [facility] [address] [place], [place] [address]    Culture   Final    NO GROWTH 5 DAYS Performed at [facility], [place] [address] [place], [place] [address]    Report Status 09/13/2020 FINAL  Final  Culture, blood (x 2)     Status: None   Collection Time: 09/08/20  9:03 PM   Specimen: BLOOD LEFT HAND  Result Value Ref Range Status   Specimen Description   Final    BLOOD LEFT HAND Performed at [facility] [address] [place], [place] [address]    Special Requests   Final    BOTTLES DRAWN AEROBIC AND ANAEROBIC Blood Culture adequate volume Performed at [facility] [address], [place], [place] [address]    Culture   Final    NO GROWTH 5 DAYS Performed at [facility], [place] [address] [place], [place] [address]    Report Status 09/13/2020 FINAL  Final     Radiology Studies: DG CHEST PORT 1 VIEW  Result Date: 09/12/2020 CLINICAL DATA:  Shortness of breath EXAM: PORTABLE CHEST 1 VIEW COMPARISON:  01/16/2019 FINDINGS: Borderline cardiomegaly with vascular congestion. No consolidation or effusion. Aortic atherosclerosis. No pneumothorax IMPRESSION: Borderline cardiomegaly with vascular congestion. Electronically Signed   By: Donavan Foil M.D.   On: 09/12/2020 23:34   DG CHEST PORT 1 VIEW  Final Result    VAS [place] ABI WITH/WO TBI  Final Result    DG Foot Complete Right  Final Result      Scheduled Meds: . (feeding supplement) PROSource Plus  30 mL Oral QID  . amoxicillin-clavulanate  1 tablet Oral Q12H  .  apixaban  5 mg Oral BID   . doxycycline  100 mg Oral Q12H  . furosemide  40 mg Intravenous BID  . insulin aspart  0-15 Units Subcutaneous TID WC  . insulin aspart  0-5 Units Subcutaneous QHS  . multivitamin with minerals  1 tablet Oral Daily   PRN Meds: acetaminophen **OR** acetaminophen, HYDROmorphone (DILAUDID) injection, labetalol, melatonin Continuous Infusions:    LOS: 5 days  Time spent: Greater than 50% of the 35 minute visit was spent in counseling/coordination of care for the patient as laid out in the A&P.   Dwyane Dee, MD [facility] 09/13/2020, 2:27 PM

## 2020-09-14 ENCOUNTER — Inpatient Hospital Stay (HOSPITAL_COMMUNITY): Payer: Medicare Other

## 2020-09-14 DIAGNOSIS — M869 Osteomyelitis, unspecified: Secondary | ICD-10-CM | POA: Diagnosis not present

## 2020-09-14 DIAGNOSIS — N1831 Chronic kidney disease, stage 3a: Secondary | ICD-10-CM | POA: Diagnosis not present

## 2020-09-14 DIAGNOSIS — N179 Acute kidney failure, unspecified: Secondary | ICD-10-CM | POA: Diagnosis not present

## 2020-09-14 LAB — CBC WITH DIFFERENTIAL/PLATELET
Abs Immature Granulocytes: 0.09 10*3/uL — ABNORMAL HIGH (ref 0.00–0.07)
Basophils Absolute: 0.1 10*3/uL (ref 0.0–0.1)
Basophils Relative: 1 %
Eosinophils Absolute: 0.2 10*3/uL (ref 0.0–0.5)
Eosinophils Relative: 2 %
HCT: 35.2 % — ABNORMAL LOW (ref 39.0–52.0)
Hemoglobin: 10.9 g/dL — ABNORMAL LOW (ref 13.0–17.0)
Immature Granulocytes: 1 %
Lymphocytes Relative: 12 %
Lymphs Abs: 1.3 10*3/uL (ref 0.7–4.0)
MCH: 27 pg (ref 26.0–34.0)
MCHC: 31 g/dL (ref 30.0–36.0)
MCV: 87.3 fL (ref 80.0–100.0)
Monocytes Absolute: 1.1 10*3/uL — ABNORMAL HIGH (ref 0.1–1.0)
Monocytes Relative: 10 %
Neutro Abs: 8 10*3/uL — ABNORMAL HIGH (ref 1.7–7.7)
Neutrophils Relative %: 74 %
Platelets: 295 10*3/uL (ref 150–400)
RBC: 4.03 MIL/uL — ABNORMAL LOW (ref 4.22–5.81)
RDW: 14.6 % (ref 11.5–15.5)
WBC: 10.8 10*3/uL — ABNORMAL HIGH (ref 4.0–10.5)
nRBC: 0 % (ref 0.0–0.2)

## 2020-09-14 LAB — BASIC METABOLIC PANEL
Anion gap: 14 (ref 5–15)
BUN: 87 mg/dL — ABNORMAL HIGH (ref 8–23)
CO2: 17 mmol/L — ABNORMAL LOW (ref 22–32)
Calcium: 7.9 mg/dL — ABNORMAL LOW (ref 8.9–10.3)
Chloride: 102 mmol/L (ref 98–111)
Creatinine, Ser: 3.72 mg/dL — ABNORMAL HIGH (ref 0.61–1.24)
GFR, Estimated: 17 mL/min — ABNORMAL LOW (ref >60)
Glucose, Bld: 233 mg/dL — ABNORMAL HIGH (ref 70–99)
Potassium: 3.4 mmol/L — ABNORMAL LOW (ref 3.5–5.1)
Sodium: 133 mmol/L — ABNORMAL LOW (ref 135–145)

## 2020-09-14 LAB — URINALYSIS, ROUTINE W REFLEX MICROSCOPIC
Bilirubin Urine: NEGATIVE
Glucose, UA: NEGATIVE mg/dL
Ketones, ur: 5 mg/dL — AB
Nitrite: NEGATIVE
Protein, ur: 100 mg/dL — AB
Specific Gravity, Urine: 1.015 (ref 1.005–1.030)
pH: 5 (ref 5.0–8.0)

## 2020-09-14 LAB — GLUCOSE, CAPILLARY
Glucose-Capillary: 197 mg/dL — ABNORMAL HIGH (ref 70–99)
Glucose-Capillary: 144 mg/dL — ABNORMAL HIGH (ref 70–99)
Glucose-Capillary: 170 mg/dL — ABNORMAL HIGH (ref 70–99)
Glucose-Capillary: 188 mg/dL — ABNORMAL HIGH (ref 70–99)

## 2020-09-14 LAB — PHOSPHORUS: Phosphorus: 6.3 mg/dL — ABNORMAL HIGH (ref 2.5–4.6)

## 2020-09-14 LAB — CREATININE, URINE, RANDOM: Creatinine, Urine: 166.2 mg/dL

## 2020-09-14 LAB — CK: Total CK: 41 U/L — ABNORMAL LOW (ref 49–397)

## 2020-09-14 LAB — MAGNESIUM: Magnesium: 1.9 mg/dL (ref 1.7–2.4)

## 2020-09-14 LAB — VANCOMYCIN, RANDOM: Vancomycin Rm: 10

## 2020-09-14 MED ORDER — POTASSIUM CHLORIDE CRYS ER 20 MEQ PO TBCR
20.0000 meq | EXTENDED_RELEASE_TABLET | Freq: Once | ORAL | Status: AC
  Administered 2020-09-14: 20 meq via ORAL
  Filled 2020-09-14: qty 1

## 2020-09-14 MED ORDER — AMOXICILLIN-POT CLAVULANATE 500-125 MG PO TABS
1.0000 | ORAL_TABLET | Freq: Two times a day (BID) | ORAL | Status: DC
  Administered 2020-09-14 – 2020-09-16 (×5): 500 mg via ORAL
  Filled 2020-09-14 (×6): qty 1

## 2020-09-14 NOTE — Progress Notes (Signed)
PROGRESS NOTE    [name].   BSF:[id number]  DOB: 09-18-1951  DOA: 09/08/2020     6  PCP: Henrine Screws, MD  CC: found down at home  Hospital Course: [name] is a 69 y.o. male with medical history significant of DM2, dCHF, PAF (supposed to be on Eliquis), HTN who presented to ED after being found down in the bathroom during welfare check.   Per report, patient noted to have an unsanitary living condition and is currently followed by adult protective services. Patient reports falling while standing up from bathroom without LOC, resulting in fall onto R chest and resultant R lower chest pain. Patient was found down and brought into ED.    Of note, patient reports stopping all medications several months ago with exception of torsemide because "I didn't think I needed them anymore." Pt has only been taking torsemide PRN swelling.  He was found to have necrotic foot infections and underwent xray of right foot, which revealed subcortical lucency involving 5th digit concerning for OM.  He was started on vancomycin and cefepime and orthopedic surgery was consulted. ABIs were also ordered.  Test was inconclusive due to severe lower extremity edema.   Initially patient was also against surgical treatment options despite severity of his wounds, their obvious chronicity, and inability to explain consequences for avoiding surgery. We requested psychiatry to evaluate patient for decision making ability in this setting. After repeat explanation of his diagnosis and possible treatment options he was able to voice adequate understanding to psychiatry and was considered to have decision making capacity regarding his current infection.   He will need to continue with serial compression wrappings at discharge in efforts to promote better chance of wound healing and proper assessment of ABIs to help decide best treatment course.   Given his significant deconditioning and after discussion with  his daughter, there is concern for his ability to safely return home due to poor living situation; Physical therapy was consulted. After PT evaluation he was recommended for SNF at discharge.  He was accepting of this and placement was pursued.  Overall, he had good clinical response to IV antibiotics.  He was deescalated to Augmentin and doxycycline to finish out a course. He will continue with serial compression wrappings at discharge to rehab as well as following up with Ortho for further management.  During hospitalization he was started on IV Lasix for diuresis given his severe lower extremity edema.  His creatinine continued to slowly uptrend.  Urine output measuring was inaccurate and difficult to gauge response. Nephrology was consulted on 09/14/2020 given his uptrending creatinine in setting of multiple antibiotics initially and some transient hypotension.   Interval History:  Sitting up in chair this morning. No events overnight. I discussed that he would not be able to go to rehab today due to worsening renal function and needing nephrology workup. He understands.   I also called and talked to [name] his daughter and updated her regarding staying in hospital longer and further working up renal function.   Old records reviewed in assessment of this patient  ROS: Constitutional: negative for chills and fevers, Respiratory: negative for cough, Cardiovascular: negative for chest pain and Gastrointestinal: negative for abdominal pain  Assessment & Plan: * Osteomyelitis of fifth toe of right foot (HCC) - probable OM per xray; ABIs indeterminate due to LE edema - patient against inpatient surgery at this time regardless - patient needs serial compression wrappings to LE to aid with  improving edema - CRP 8.2, ESR 55. Will serve as baseline going forward  -Outpatient follow-up with orthopedic surgery -Discontinue IV antibiotics.  Start Augmentin and doxycycline.  will continue for 2-week  course  Acute renal failure superimposed on stage 3a chronic kidney disease ([place]) - baseline creatinine approx 1.2 - 1.3 - creatinine 1.98 on admission which has uptrended (now up to 3.72 on 10/31) and BUN rising as well. No improvement despite albumin and lasix trial. Possibly cumulative effect of abx, hypotension, infection/illness? - strict I&O; discussed with nursing; patient does have a condom cath on but no recordings for past 24 hours  - nephrology consult given worsening renal function, appreciate assistance - follow up u/s, bladder scanning, and urine studies   Severe sepsis (HCC)-resolved as of 09/12/2020 -Tachycardic, tachypneic, elevated WBC, elevated lactic acid.  Source considered right foot -See osteomyelitis above  Chronic edema -Continue lower extremity compression wrappings.  Will follow up if able to set him up with outpatient wound care center versus home health or combination -This is preventing adequate ABI evaluation  Atrial fibrillation with RVR ([place]) - RVR considered in setting of sepsis on admission - responded to BB (patient noncompliant at home) - started on heparin drip on admission; given no upcoming surgery at this point, d/c heparin drip and resume Eliquis (will need to counsel patient on compliance at d/c once again)  Acute on chronic heart failure with preserved ejection fraction (HFpEF) ([place]) Most recent 2d echo from 3/21 with EF of 40-45%, unable to evaluate diastolic dysfunction at that time - lasix now on hold due to renal function - see AoCKD  Essential hypertension - has been mildly hypotensive/low normal without any medication - continue monitoring BP - lasix now on hold   Type 2 diabetes mellitus ([place]) - A1c 9.6% - continue SSI and CBG for now - patient also noncompliant with regimen at home; states "I didn't think I need it", he is now amenable to restarting medications as prescribed   Antimicrobials: Vanc 10/25>>10/27 Zosyn 10/25 x  1 Cefepime 10/25>> 09/12/2020 Daptomycin 10/27>> 09/12/2020 Flagyl 10/27>> 09/12/2020 Augmentin 09/12/2020>> present Doxycycline 09/12/2020>> present   DVT prophylaxis: Eliquis Code Status: Full Family Communication: none present Disposition Plan: Status is: Inpatient  Remains inpatient appropriate because:Unsafe d/c plan, IV treatments appropriate due to intensity of illness or inability to take PO and Inpatient level of care appropriate due to severity of illness   Dispo: The patient is from: Home              Anticipated d/c is to: SNF              Anticipated d/c date is: > 3 days. Rehab postponed until renal function improves               Patient currently is not medically stable to d/c.  Objective: Blood pressure 95/72, pulse 73, temperature 97.9 F (36.6 C), temperature source Oral, resp. rate 20, height $RemoveBe'5\' 9"'[id number]$  (1.753 m), weight 124.7 kg, SpO2 96 %.  Examination: General appearance: Pleasant elderly man sitting in chair resting Head: Normocephalic, without obvious abnormality, atraumatic Eyes: EOMI Lungs: clear to auscultation bilaterally Heart: regular rate and rhythm and S1, S2 normal Abdomen: obese, soft, NT, ND, BS present Extremities: B/L severe chronic 3+++ LE edema noted. Feet are noted with severe necrotic lesions ulcers on plantar surfaces. Right foot noted with large heel ulcer and ball of foot ulcer; legs are now wrapped in compression bandage Skin: mobility and turgor normal Neurologic:  Grossly normal  Consultants:   Ortho  Nephrology   Procedures:   none  Data Reviewed: I have personally reviewed following labs and imaging studies Results for orders placed or performed during the hospital encounter of 09/08/20 (from the past 24 hour(s))  Glucose, capillary     Status: Abnormal   Collection Time: 09/13/20 12:30 PM  Result Value Ref Range   Glucose-Capillary 157 (H) 70 - 99 mg/dL  Glucose, capillary     Status: Abnormal   Collection Time:  09/13/20  4:16 PM  Result Value Ref Range   Glucose-Capillary 183 (H) 70 - 99 mg/dL  Glucose, capillary     Status: Abnormal   Collection Time: 09/13/20  9:28 PM  Result Value Ref Range   Glucose-Capillary 206 (H) 70 - 99 mg/dL  Basic metabolic panel     Status: Abnormal   Collection Time: 09/14/20  6:07 AM  Result Value Ref Range   Sodium 133 (L) 135 - 145 mmol/L   Potassium 3.4 (L) 3.5 - 5.1 mmol/L   Chloride 102 98 - 111 mmol/L   CO2 17 (L) 22 - 32 mmol/L   Glucose, Bld 233 (H) 70 - 99 mg/dL   BUN 87 (H) 8 - 23 mg/dL   Creatinine, Ser 6.38 (H) 0.61 - 1.24 mg/dL   Calcium 7.9 (L) 8.9 - 10.3 mg/dL   GFR, Estimated 17 (L) >60 mL/min   Anion gap 14 5 - 15  CBC with Differential/Platelet     Status: Abnormal   Collection Time: 09/14/20  6:07 AM  Result Value Ref Range   WBC 10.8 (H) 4.0 - 10.5 K/uL   RBC 4.03 (L) 4.22 - 5.81 MIL/uL   Hemoglobin 10.9 (L) 13.0 - 17.0 g/dL   HCT 93.7 (L) 39 - 52 %   MCV 87.3 80.0 - 100.0 fL   MCH 27.0 26.0 - 34.0 pg   MCHC 31.0 30.0 - 36.0 g/dL   RDW 34.2 87.6 - 81.1 %   Platelets 295 150 - 400 K/uL   nRBC 0.0 0.0 - 0.2 %   Neutrophils Relative % 74 %   Neutro Abs 8.0 (H) 1.7 - 7.7 K/uL   Lymphocytes Relative 12 %   Lymphs Abs 1.3 0.7 - 4.0 K/uL   Monocytes Relative 10 %   Monocytes Absolute 1.1 (H) 0.1 - 1.0 K/uL   Eosinophils Relative 2 %   Eosinophils Absolute 0.2 0.0 - 0.5 K/uL   Basophils Relative 1 %   Basophils Absolute 0.1 0.0 - 0.1 K/uL   Immature Granulocytes 1 %   Abs Immature Granulocytes 0.09 (H) 0.00 - 0.07 K/uL  Magnesium     Status: None   Collection Time: 09/14/20  6:07 AM  Result Value Ref Range   Magnesium 1.9 1.7 - 2.4 mg/dL  Glucose, capillary     Status: Abnormal   Collection Time: 09/14/20  7:52 AM  Result Value Ref Range   Glucose-Capillary 197 (H) 70 - 99 mg/dL  Glucose, capillary     Status: Abnormal   Collection Time: 09/14/20 11:56 AM  Result Value Ref Range   Glucose-Capillary 144 (H) 70 - 99 mg/dL     Recent Results (from the past 240 hour(s))  Respiratory Panel by RT PCR (Flu A&B, Covid) - Nasopharyngeal Swab     Status: None   Collection Time: 09/08/20  5:13 PM   Specimen: Nasopharyngeal Swab  Result Value Ref Range Status   SARS Coronavirus 2 by RT PCR NEGATIVE NEGATIVE Final  Comment: (NOTE) SARS-CoV-2 target nucleic acids are NOT DETECTED.  The SARS-CoV-2 RNA is generally detectable in upper respiratoy specimens during the acute phase of infection. The lowest concentration of SARS-CoV-2 viral copies this assay can detect is 131 copies/mL. A negative result does not preclude SARS-Cov-2 infection and should not be used as the sole basis for treatment or other patient management decisions. A negative result may occur with  improper specimen collection/handling, submission of specimen other than nasopharyngeal swab, presence of viral mutation(s) within the areas targeted by this assay, and inadequate number of viral copies (<131 copies/mL). A negative result must be combined with clinical observations, patient history, and epidemiological information. The expected result is Negative.  Fact Sheet for Patients:  PinkCheek.be  Fact Sheet for Healthcare Providers:  GravelBags.it  This test is no t yet approved or cleared by the [place] FDA and  has been authorized for detection and/or diagnosis of SARS-CoV-2 by FDA under an Emergency Use Authorization (EUA). This EUA will remain  in effect (meaning this test can be used) for the duration of the COVID-19 declaration under Section 564(b)(1) of the Act, 21 U.S.C. section 360bbb-3(b)(1), unless the authorization is terminated or revoked sooner.     Influenza A by PCR NEGATIVE NEGATIVE Final   Influenza B by PCR NEGATIVE NEGATIVE Final    Comment: (NOTE) The Xpert Xpress SARS-CoV-2/FLU/RSV assay is intended as an aid in  the diagnosis of influenza from  Nasopharyngeal swab specimens and  should not be used as a sole basis for treatment. Nasal washings and  aspirates are unacceptable for Xpert Xpress SARS-CoV-2/FLU/RSV  testing.  Fact Sheet for Patients: PinkCheek.be  Fact Sheet for Healthcare Providers: GravelBags.it  This test is not yet approved or cleared by the [place] FDA and  has been authorized for detection and/or diagnosis of SARS-CoV-2 by  FDA under an Emergency Use Authorization (EUA). This EUA will remain  in effect (meaning this test can be used) for the duration of the  Covid-19 declaration under Section 564(b)(1) of the Act, 21  U.S.C. section 360bbb-3(b)(1), unless the authorization is  terminated or revoked. Performed at [facility], [place] [address], [place], [place] [address]   Blood Cultures x 2 sites     Status: None   Collection Time: 09/08/20  7:04 PM   Specimen: BLOOD  Result Value Ref Range Status   Specimen Description   Final    BLOOD LEFT WRIST Performed at [facility] [address], [place], [place] [address]    Special Requests   Final    BOTTLES DRAWN AEROBIC AND ANAEROBIC Blood Culture adequate volume Performed at [facility] [address] [place], [place] [address]    Culture   Final    NO GROWTH 5 DAYS Performed at [facility], [place] [address] [place], [place] [address]    Report Status 09/13/2020 FINAL  Final  Culture, blood (x 2)     Status: None   Collection Time: 09/08/20  9:03 PM   Specimen: BLOOD LEFT HAND  Result Value Ref Range Status   Specimen Description   Final    BLOOD LEFT HAND Performed at [facility] [address], [place], [place] [address]    Special Requests   Final    BOTTLES DRAWN AEROBIC AND ANAEROBIC Blood Culture adequate volume Performed at [facility] [address], [place],  [place] [address]    Culture  Final    NO GROWTH 5 DAYS Performed at [facility], [place] [address] [place],  [address]    Report Status 09/13/2020 FINAL  Final     Radiology Studies: DG CHEST PORT 1 VIEW  Result Date: 09/12/2020 CLINICAL DATA:  Shortness of breath EXAM: PORTABLE CHEST 1 VIEW COMPARISON:  01/16/2019 FINDINGS: Borderline cardiomegaly with vascular congestion. No consolidation or effusion. Aortic atherosclerosis. No pneumothorax IMPRESSION: Borderline cardiomegaly with vascular congestion. Electronically Signed   By: Donavan Foil M.D.   On: 09/12/2020 23:34   DG CHEST PORT 1 VIEW  Final Result    VAS [place] ABI WITH/WO TBI  Final Result    DG Foot Complete Right  Final Result    US RENAL    (Results Pending)    Scheduled Meds: . (feeding supplement) PROSource Plus  30 mL Oral QID  . amoxicillin-clavulanate  1 tablet Oral Q12H  . apixaban  5 mg Oral BID  . doxycycline  100 mg Oral Q12H  . insulin aspart  0-15 Units Subcutaneous TID WC  . insulin aspart  0-5 Units Subcutaneous QHS  . multivitamin with minerals  1 tablet Oral Daily   PRN Meds: acetaminophen **OR** acetaminophen, HYDROmorphone (DILAUDID) injection, labetalol, melatonin Continuous Infusions:    LOS: 6 days  Time spent: Greater than 50% of the 35 minute visit was spent in counseling/coordination of care for the patient as laid out in the A&P.   Dwyane Dee, MD [facility] 09/14/2020, 12:12 PM

## 2020-09-14 NOTE — Progress Notes (Signed)
PHARMACY NOTE:  ANTIMICROBIAL RENAL DOSAGE ADJUSTMENT  Current antimicrobial regimen includes a mismatch between antimicrobial dosage and estimated renal function.  As per policy approved by the Pharmacy & Therapeutics and Medical Executive Committees, the antimicrobial dosage will be adjusted accordingly.  Current antimicrobial dosage:  Augmentin 875 mg PO BID  Indication: Sepsis w/ osteomyelitis  Renal Function: Estimated Creatinine Clearance: 23.8 mL/min (A) (by C-G formula based on SCr of 3.72 mg/dL (H)).    Antimicrobial dosage has been changed to:  Augmentin 500 mg PO BID  Additional comments:  Lasix d/c today, pharmacy will follow up and re-adjust dose if improved.   Thank you for allowing pharmacy to be a part of this patient's care.  Gretta Arab PharmD, BCPS Clinical Pharmacist [facility] main pharmacy [phone] 09/14/2020 3:22 PM

## 2020-09-14 NOTE — Progress Notes (Signed)
Pt voided 136ml in the urinal, post voidal bladder scan shows 76ml. Urine specimen is collected and send to the lab.

## 2020-09-14 NOTE — Consult Note (Signed)
Reason for Consult: Renal failure Referring Physician: Dr. Sabino Gasser  Chief Complaint: Rt foot wound  Assessment/Plan: 1. AKI on CKD IIIb - BL creatinine in the 1.3-1.4 ranage in 01/2020. Creatinine had risen from 1.96 at admission to 2.75-2.78 range for a few days before increasing in the past few days and appears to be net positive 4.8L during this hospitalization; he was treated with Lasix $RemoveBe'40mg'IvCVLESVh$  IV daily for a few days. Presentation BP was in the 112-120's range but since then has been in the 44-034'V systolic range. Appears to have had progression of baseline kidney disease and now an acute component as well which is likely related to the osteomyelitis + hemodynamics (relative hypotension and being diuresed).  - Strict I&O's + daily weights. - Will send urine studies - Renal ultrasound to rule out obstruction (less likely), cortical thickness and size given the possible CKD. - Bladder scan PVR (d/w nurse) - Random Vancomycin level as well.  2. CHF with diastolic and systolic component w/ decreased EF of 40-45% 01/2020 3. HTN - relatively hypotensive at this time. 4. T2DM - [place] 5. Afib w/ RVR 6. Sepsis w/ osteomyelitis of the 5th toe on rt foot   HPI: [name]. is an 69 y.o. male DM, dCHF, afib, HTN presenting after being found down in the bathroom living in unsanitary conditions being followed by adult protective services. He did report falling while standing leading to trauma to the right chest before being found and transporting to the ED. He had stopped all medications except torsemide many months ago and was found in the ED to be tachycardic and tachypneic with a creatinine of 1.98. Patient was treated with broad spectrum abx and IVF. He had necrotic foot wounds and rt foot 5th digit concers of osteomyelitis. Noted also to have an elevated CRP/ ESR / Lactate. He was only on Vancomycin from 10/25 -> 10/26 (2 doses) + single dose of Zosyn ->  Cefepime, Daptomycin and Flagyl -> Augmentin  + doxycycline.   ROS Pertinent items are noted in HPI.  Chemistry and CBC: Creat  Date/Time Value Ref Range Status  10/22/2019 09:41 AM 1.39 (H) 0.70 - 1.18 mg/dL Final    Comment:    For patients >15 years of age, the reference limit for Creatinine is approximately 13% higher for people identified as African-American. .    Creatinine, Ser  Date/Time Value Ref Range Status  09/14/2020 06:07 AM 3.72 (H) 0.61 - 1.24 mg/dL Final  09/13/2020 05:26 AM 2.99 (H) 0.61 - 1.24 mg/dL Final  09/12/2020 04:16 AM 2.75 (H) 0.61 - 1.24 mg/dL Final  09/11/2020 04:10 AM 2.78 (H) 0.61 - 1.24 mg/dL Final  09/10/2020 04:50 AM 2.77 (H) 0.61 - 1.24 mg/dL Final  09/09/2020 07:22 AM 2.19 (H) 0.61 - 1.24 mg/dL Final  09/08/2020 07:04 PM 1.96 (H) 0.61 - 1.24 mg/dL Final  09/08/2020 02:46 PM 1.98 (H) 0.61 - 1.24 mg/dL Final  02/06/2020 05:24 AM 1.38 (H) 0.61 - 1.24 mg/dL Final  02/05/2020 04:22 AM 1.44 (H) 0.61 - 1.24 mg/dL Final  02/04/2020 04:38 AM 1.51 (H) 0.61 - 1.24 mg/dL Final  02/03/2020 04:25 AM 1.43 (H) 0.61 - 1.24 mg/dL Final  02/02/2020 07:08 AM 1.34 (H) 0.61 - 1.24 mg/dL Final  02/01/2020 01:45 AM 1.12 0.61 - 1.24 mg/dL Final  01/31/2020 11:58 AM 1.32 (H) 0.61 - 1.24 mg/dL Final  07/20/2018 10:42 AM 1.29 (H) 0.76 - 1.27 mg/dL Final  06/26/2018 10:14 AM 1.10 0.76 - 1.27 mg/dL Final  06/20/2018  12:14 PM 1.65 (H) 0.76 - 1.27 mg/dL Final  06/15/2018 11:01 AM 1.08 0.76 - 1.27 mg/dL Final  09/26/2017 08:05 AM 1.04 0.76 - 1.27 mg/dL Final  08/23/2017 09:24 AM 1.08 0.76 - 1.27 mg/dL Final  07/19/2017 09:37 AM 1.10 0.76 - 1.27 mg/dL Final  01/31/2015 05:04 AM 1.29 0.50 - 1.35 mg/dL Final  01/30/2015 07:50 AM 1.02 0.50 - 1.35 mg/dL Final  01/29/2015 08:08 PM 1.05 0.50 - 1.35 mg/dL Final  01/17/2015 10:31 AM 0.96 0.50 - 1.35 mg/dL Final  03/07/2013 02:00 PM 0.81 0.50 - 1.35 mg/dL Final   Recent Labs  Lab 09/08/20 1904 09/09/20 0722 09/10/20 0450 09/11/20 0410 09/12/20 0416 09/13/20 0526  09/14/20 0607  NA 134* 132* 133* 133* 131* 136 133*  K 3.8 3.8 4.1 4.0 3.2* 3.5 3.4*  CL 99 97* 100 102 100 104 102  CO2 23 20* 19* 18* 15* 17* 17*  GLUCOSE 205* 184* 144* 164* 173* 143* 233*  BUN 40* 42* 49* 56* 68* 80* 87*  CREATININE 1.96* 2.19* 2.77* 2.78* 2.75* 2.99* 3.72*  CALCIUM 7.7* 7.6* 7.7* 7.8* 7.6* 7.9* 7.9*   Recent Labs  Lab 09/11/20 0410 09/12/20 0416 09/13/20 0526 09/14/20 0607  WBC 12.6* 10.4 10.8* 10.8*  NEUTROABS 9.9* 7.5 8.0* 8.0*  HGB 11.7* 10.9* 11.2* 10.9*  HCT 37.2* 34.9* 37.2* 35.2*  MCV 87.3 86.8 88.8 87.3  PLT 248 246 286 295   Liver Function Tests: Recent Labs  Lab 09/08/20 1446 09/09/20 0722 09/10/20 0450  AST $Re'23 26 20  'SOx$ ALT $R'18 19 16  'hF$ ALKPHOS 86 74 72  BILITOT 1.2 0.9 0.7  PROT 7.9 7.3 7.1  ALBUMIN 2.8* 2.5* 2.4*   No results for input(s): LIPASE, AMYLASE in the last 168 hours. No results for input(s): AMMONIA in the last 168 hours. Cardiac Enzymes: No results for input(s): CKTOTAL, CKMB, CKMBINDEX, TROPONINI in the last 168 hours. Iron Studies: No results for input(s): IRON, TIBC, TRANSFERRIN, FERRITIN in the last 72 hours. PT/INR: $RemoveBefore'@LABRCNTIP'FZpCNoqeqBcTs$ (inr:5)  Xrays/Other Studies: ) Results for orders placed or performed during the hospital encounter of 09/08/20 (from the past 48 hour(s))  Glucose, capillary     Status: Abnormal   Collection Time: 09/12/20 11:38 AM  Result Value Ref Range   Glucose-Capillary 181 (H) 70 - 99 mg/dL    Comment: Glucose reference range applies only to samples taken after fasting for at least 8 hours.  Glucose, capillary     Status: Abnormal   Collection Time: 09/12/20  5:20 PM  Result Value Ref Range   Glucose-Capillary 157 (H) 70 - 99 mg/dL    Comment: Glucose reference range applies only to samples taken after fasting for at least 8 hours.  Glucose, capillary     Status: Abnormal   Collection Time: 09/12/20  9:33 PM  Result Value Ref Range   Glucose-Capillary 152 (H) 70 - 99 mg/dL    Comment: Glucose  reference range applies only to samples taken after fasting for at least 8 hours.  Basic metabolic panel     Status: Abnormal   Collection Time: 09/13/20  5:26 AM  Result Value Ref Range   Sodium 136 135 - 145 mmol/L   Potassium 3.5 3.5 - 5.1 mmol/L   Chloride 104 98 - 111 mmol/L   CO2 17 (L) 22 - 32 mmol/L   Glucose, Bld 143 (H) 70 - 99 mg/dL    Comment: Glucose reference range applies only to samples taken after fasting for at least 8 hours.  BUN 80 (H) 8 - 23 mg/dL   Creatinine, Ser 2.99 (H) 0.61 - 1.24 mg/dL   Calcium 7.9 (L) 8.9 - 10.3 mg/dL   GFR, Estimated 22 (L) >60 mL/min    Comment: (NOTE) Calculated using the CKD-EPI Creatinine Equation (2021)    Anion gap 15 5 - 15    Comment: Performed at [facility], [place] [address], [place], [place] [address]  CBC with Differential/Platelet     Status: Abnormal   Collection Time: 09/13/20  5:26 AM  Result Value Ref Range   WBC 10.8 (H) 4.0 - 10.5 K/uL   RBC 4.19 (L) 4.22 - 5.81 MIL/uL   Hemoglobin 11.2 (L) 13.0 - 17.0 g/dL   HCT 37.2 (L) 39 - 52 %   MCV 88.8 80.0 - 100.0 fL   MCH 26.7 26.0 - 34.0 pg   MCHC 30.1 30.0 - 36.0 g/dL   RDW 14.6 11.5 - 15.5 %   Platelets 286 150 - 400 K/uL   nRBC 0.0 0.0 - 0.2 %   Neutrophils Relative % 73 %   Neutro Abs 8.0 (H) 1.7 - 7.7 K/uL   Lymphocytes Relative 13 %   Lymphs Abs 1.4 0.7 - 4.0 K/uL   Monocytes Relative 10 %   Monocytes Absolute 1.0 0.1 - 1.0 K/uL   Eosinophils Relative 2 %   Eosinophils Absolute 0.2 0.0 - 0.5 K/uL   Basophils Relative 1 %   Basophils Absolute 0.1 0.0 - 0.1 K/uL   Immature Granulocytes 1 %   Abs Immature Granulocytes 0.10 (H) 0.00 - 0.07 K/uL    Comment: Performed at [facility], [place] [address], [place], [place] [address]  Magnesium     Status: None   Collection Time: 09/13/20  5:26 AM  Result Value Ref Range   Magnesium 1.9 1.7 - 2.4 mg/dL    Comment: Performed at [facility], [place] [address], [place], [place] [address]  Glucose, capillary     Status: Abnormal   Collection Time: 09/13/20  7:45 AM  Result Value Ref Range   Glucose-Capillary 131 (H) 70 - 99 mg/dL    Comment: Glucose reference range applies only to samples taken after fasting for at least 8 hours.  Glucose, capillary     Status: Abnormal   Collection Time: 09/13/20 12:30 PM  Result Value Ref Range   Glucose-Capillary 157 (H) 70 - 99 mg/dL    Comment: Glucose reference range applies only to samples taken after fasting for at least 8 hours.  Glucose, capillary     Status: Abnormal   Collection Time: 09/13/20  4:16 PM  Result Value Ref Range   Glucose-Capillary 183 (H) 70 - 99 mg/dL    Comment: Glucose reference range applies only to samples taken after fasting for at least 8 hours.  Glucose, capillary     Status: Abnormal   Collection Time: 09/13/20  9:28 PM  Result Value Ref Range   Glucose-Capillary 206 (H) 70 - 99 mg/dL    Comment: Glucose reference range applies only to samples taken after fasting for at least 8 hours.  Basic metabolic panel     Status: Abnormal   Collection Time: 09/14/20  6:07 AM  Result Value Ref Range   Sodium 133 (L) 135 - 145 mmol/L   Potassium 3.4 (L) 3.5 - 5.1 mmol/L   Chloride 102 98 - 111 mmol/L   CO2 17 (L) 22 - 32 mmol/L   Glucose, Bld 233 (H)  70 - 99 mg/dL    Comment: Glucose reference range applies only to samples taken after fasting for at least 8 hours.   BUN 87 (H) 8 - 23 mg/dL   Creatinine, Ser 3.72 (H) 0.61 - 1.24 mg/dL   Calcium 7.9 (L) 8.9 - 10.3 mg/dL   GFR, Estimated 17 (L) >60 mL/min    Comment: (NOTE) Calculated using the CKD-EPI Creatinine Equation (2021)    Anion gap 14 5 - 15    Comment: Performed at [facility], [place] [address] [place], [place] [address]  CBC with Differential/Platelet     Status: Abnormal   Collection Time: 09/14/20  6:07 AM  Result Value Ref Range   WBC 10.8 (H) 4.0 - 10.5 K/uL   RBC 4.03 (L) 4.22 - 5.81 MIL/uL    Hemoglobin 10.9 (L) 13.0 - 17.0 g/dL   HCT 35.2 (L) 39 - 52 %   MCV 87.3 80.0 - 100.0 fL   MCH 27.0 26.0 - 34.0 pg   MCHC 31.0 30.0 - 36.0 g/dL   RDW 14.6 11.5 - 15.5 %   Platelets 295 150 - 400 K/uL   nRBC 0.0 0.0 - 0.2 %   Neutrophils Relative % 74 %   Neutro Abs 8.0 (H) 1.7 - 7.7 K/uL   Lymphocytes Relative 12 %   Lymphs Abs 1.3 0.7 - 4.0 K/uL   Monocytes Relative 10 %   Monocytes Absolute 1.1 (H) 0.1 - 1.0 K/uL   Eosinophils Relative 2 %   Eosinophils Absolute 0.2 0.0 - 0.5 K/uL   Basophils Relative 1 %   Basophils Absolute 0.1 0.0 - 0.1 K/uL   Immature Granulocytes 1 %   Abs Immature Granulocytes 0.09 (H) 0.00 - 0.07 K/uL    Comment: Performed at [facility], [place] [address], [place], [place] [address]  Magnesium     Status: None   Collection Time: 09/14/20  6:07 AM  Result Value Ref Range   Magnesium 1.9 1.7 - 2.4 mg/dL    Comment: Performed at [facility], [place] [address], [place], [place] [address]  Glucose, capillary     Status: Abnormal   Collection Time: 09/14/20  7:52 AM  Result Value Ref Range   Glucose-Capillary 197 (H) 70 - 99 mg/dL    Comment: Glucose reference range applies only to samples taken after fasting for at least 8 hours.   DG CHEST PORT 1 VIEW  Result Date: 09/12/2020 CLINICAL DATA:  Shortness of breath EXAM: PORTABLE CHEST 1 VIEW COMPARISON:  01/16/2019 FINDINGS: Borderline cardiomegaly with vascular congestion. No consolidation or effusion. Aortic atherosclerosis. No pneumothorax IMPRESSION: Borderline cardiomegaly with vascular congestion. Electronically Signed   By: Donavan Foil M.D.   On: 09/12/2020 23:34    PMH:   Past Medical History:  Diagnosis Date  . Arthritis   . Back pain    occasionally  . BPH (benign prostatic hypertrophy)    takes Uroxatral daily as well as Finasteride   . Diabetes mellitus without complication ([place])    takes Metformin daily  . Diabetic foot infection ([place]) 10/22/2019  .  GERD (gastroesophageal reflux disease)    no meds  . Hepatitis hx of   at age 89  . Hypertension    takes Losartan and HCTZ daily  . Joint pain   . Lymphedema 10/22/2019  . Urinary frequency   . Urinary urgency   . Venous stasis dermatitis 11/19/2019    PSH:   Past Surgical History:  Procedure Laterality Date  . APPENDECTOMY     age 72  . COLONOSCOPY    . KNEE ARTHROSCOPY  1984   right and left   . SHOULDER ARTHROSCOPY  2003   right RCR-GSC-stayed overnight-had SOB  . SHOULDER ARTHROSCOPY WITH ROTATOR CUFF REPAIR AND SUBACROMIAL DECOMPRESSION Left 03/13/2013   Procedure: LEFT SHOULDER ARTHROSCOPY WITH SUBACROMIAL DECOMPRESSION, DISTAL CLAVICLE RESECTION AND REPAIR ROTATOR CUFF AND LABRAL DEBRIDEMENT;  Surgeon: [name]., MD;  Location: [facility];  Service: Orthopedics;  Laterality: Left;  . TOTAL HIP ARTHROPLASTY Right 01/29/2015   Procedure: TOTAL HIP ARTHROPLASTY ANTERIOR APPROACH;  Surgeon: [name], MD;  Location: [facility];  Service: Orthopedics;  Laterality: Right;  Marland Kitchen VASECTOMY    . VASECTOMY REVERSAL  1993    Allergies:  Allergies  Allergen Reactions  . Codeine Swelling    Water retention  . Sulfur Other (See Comments)    Not sure of reaction a young child    Medications:   Prior to Admission medications   Medication Sig Start Date End Date Taking? Authorizing Provider  apixaban (ELIQUIS) 5 MG TABS tablet Take 1 tablet (5 mg total) by mouth 2 (two) times daily. Patient not taking: Reported on 09/08/2020 02/06/20 03/07/20  Marianna Payment, MD  metoprolol tartrate (LOPRESSOR) 50 MG tablet Take 1 tablet (50 mg total) by mouth 2 (two) times daily for 30 doses. Patient not taking: Reported on 09/08/2020 02/06/20 02/21/20  Marianna Payment, MD  potassium chloride SA (KLOR-CON) 20 MEQ tablet Take 1 tablet by mouth daily Patient not taking: Reported on 09/08/2020 09/18/19   Richardson Dopp T, PA-C  torsemide (DEMADEX) 20 MG tablet Take 2 tablets (40 mg total)  by mouth 2 (two) times daily. Patient not taking: Reported on 09/08/2020 02/06/20 03/07/20  Marianna Payment, MD    Discontinued Meds:   Medications Discontinued During This Encounter  Medication Reason  . finasteride (PROSCAR) 5 MG tablet Patient Preference  . Cholecalciferol (VITAMIN D3) 5000 UNITS TABS Patient Preference  . fluticasone (FLONASE) 50 MCG/ACT nasal spray Patient Preference  . GABAPENTIN PO Patient Preference  . glipiZIDE (GLUCOTROL XL) 5 MG 24 hr tablet Patient Preference  . vancomycin (VANCOCIN) IVPB 1000 QM/086 mL premix Duplicate  . diltiazem (CARDIZEM) 1 mg/mL load via infusion 20 mg   . diltiazem (CARDIZEM) 125 mg in dextrose 5% 125 mL (1 mg/mL) infusion   . heparin ADULT infusion 100 units/mL (25000 units/264mL sodium chloride 0.45%)   . metoprolol tartrate (LOPRESSOR) tablet 25 mg   . 0.9 %  sodium chloride infusion   . heparin ADULT infusion 100 units/mL (25000 units/270mL sodium chloride 0.45%)   . heparin ADULT infusion 100 units/mL (25000 units/274mL sodium chloride 0.45%)   . vancomycin (VANCOREADY) IVPB 1500 mg/300 mL   . vancomycin variable dose per unstable renal function (pharmacist dosing)   . heparin ADULT infusion 100 units/mL (25000 units/237mL sodium chloride 0.45%)   . metoprolol tartrate (LOPRESSOR) tablet 50 mg   . ceFEPIme (MAXIPIME) 2 g in sodium chloride 0.9 % 100 mL IVPB   . metroNIDAZOLE (FLAGYL) IVPB 500 mg   . DAPTOmycin (CUBICIN) 740 mg in sodium chloride 0.9 % IVPB   . furosemide (LASIX) injection 40 mg   . furosemide (LASIX) injection 40 mg     Social History:  reports that he has never smoked. He quit smokeless tobacco use about 56 years ago.  His smokeless tobacco use included chew. He reports current alcohol use. He reports that he does  not use drugs.  Family History:   Family History  Problem Relation Age of Onset  . Benign prostatic hyperplasia Father   . Kidney cancer Neg Hx   . Kidney disease Neg Hx     Blood pressure  95/72, pulse 73, temperature 97.9 F (36.6 C), temperature source Oral, resp. rate 20, height $RemoveBe'5\' 9"'sQZzABTMl$  (1.753 m), weight 124.7 kg, SpO2 96 %. General appearance: alert, cooperative and appears stated age Head: Normocephalic, without obvious abnormality, atraumatic Eyes: negative Neck: no adenopathy, no carotid bruit, supple, symmetrical, trachea midline and thyroid not enlarged, symmetric, no tenderness/mass/nodules Back: symmetric, no curvature. ROM normal. No CVA tenderness. Resp: clear to auscultation bilaterally Chest wall: no tenderness Cardio: irregularly irregular rhythm and tachy GI: obese Extremities: Significant woody edema in lower extremities and very poor hygiene Pulses: 2+ and symmetric Skin: See above Lymph nodes: Cervical adenopathy: none       Jaylnn Ullery, Hunt Oris, MD 09/14/2020, 10:53 AM

## 2020-09-15 DIAGNOSIS — N179 Acute kidney failure, unspecified: Secondary | ICD-10-CM | POA: Diagnosis not present

## 2020-09-15 DIAGNOSIS — M869 Osteomyelitis, unspecified: Secondary | ICD-10-CM | POA: Diagnosis not present

## 2020-09-15 DIAGNOSIS — I5033 Acute on chronic diastolic (congestive) heart failure: Secondary | ICD-10-CM | POA: Diagnosis not present

## 2020-09-15 DIAGNOSIS — N1831 Chronic kidney disease, stage 3a: Secondary | ICD-10-CM | POA: Diagnosis not present

## 2020-09-15 LAB — BASIC METABOLIC PANEL
Anion gap: 15 (ref 5–15)
BUN: 88 mg/dL — ABNORMAL HIGH (ref 8–23)
CO2: 16 mmol/L — ABNORMAL LOW (ref 22–32)
Calcium: 8.1 mg/dL — ABNORMAL LOW (ref 8.9–10.3)
Chloride: 105 mmol/L (ref 98–111)
Creatinine, Ser: 4.79 mg/dL — ABNORMAL HIGH (ref 0.61–1.24)
GFR, Estimated: 12 mL/min — ABNORMAL LOW (ref >60)
Glucose, Bld: 187 mg/dL — ABNORMAL HIGH (ref 70–99)
Potassium: 4 mmol/L (ref 3.5–5.1)
Sodium: 136 mmol/L (ref 135–145)

## 2020-09-15 LAB — CBC WITH DIFFERENTIAL/PLATELET
Abs Immature Granulocytes: 0.14 10*3/uL — ABNORMAL HIGH (ref 0.00–0.07)
Basophils Absolute: 0.1 10*3/uL (ref 0.0–0.1)
Basophils Relative: 1 %
Eosinophils Absolute: 0.2 10*3/uL (ref 0.0–0.5)
Eosinophils Relative: 2 %
HCT: 37.9 % — ABNORMAL LOW (ref 39.0–52.0)
Hemoglobin: 11.4 g/dL — ABNORMAL LOW (ref 13.0–17.0)
Immature Granulocytes: 1 %
Lymphocytes Relative: 13 %
Lymphs Abs: 1.6 10*3/uL (ref 0.7–4.0)
MCH: 27.1 pg (ref 26.0–34.0)
MCHC: 30.1 g/dL (ref 30.0–36.0)
MCV: 90.2 fL (ref 80.0–100.0)
Monocytes Absolute: 1.4 10*3/uL — ABNORMAL HIGH (ref 0.1–1.0)
Monocytes Relative: 12 %
Neutro Abs: 8.5 10*3/uL — ABNORMAL HIGH (ref 1.7–7.7)
Neutrophils Relative %: 71 %
Platelets: 326 10*3/uL (ref 150–400)
RBC: 4.2 MIL/uL — ABNORMAL LOW (ref 4.22–5.81)
RDW: 14.7 % (ref 11.5–15.5)
WBC: 11.8 10*3/uL — ABNORMAL HIGH (ref 4.0–10.5)
nRBC: 0 % (ref 0.0–0.2)

## 2020-09-15 LAB — GLUCOSE, CAPILLARY
Glucose-Capillary: 157 mg/dL — ABNORMAL HIGH (ref 70–99)
Glucose-Capillary: 150 mg/dL — ABNORMAL HIGH (ref 70–99)
Glucose-Capillary: 144 mg/dL — ABNORMAL HIGH (ref 70–99)
Glucose-Capillary: 152 mg/dL — ABNORMAL HIGH (ref 70–99)

## 2020-09-15 LAB — SODIUM, URINE, RANDOM: Sodium, Ur: <40 mmol/L

## 2020-09-15 LAB — PHOSPHORUS: Phosphorus: 7 mg/dL — ABNORMAL HIGH (ref 2.5–4.6)

## 2020-09-15 LAB — MAGNESIUM: Magnesium: 1.9 mg/dL (ref 1.7–2.4)

## 2020-09-15 MED ORDER — HEPARIN (PORCINE) 25000 UT/250ML-% IV SOLN
1800.0000 [IU]/h | INTRAVENOUS | Status: DC
  Administered 2020-09-15: 1600 [IU]/h via INTRAVENOUS
  Administered 2020-09-16: 1800 [IU]/h via INTRAVENOUS
  Filled 2020-09-15 (×2): qty 250

## 2020-09-15 MED ORDER — METOPROLOL TARTRATE 50 MG PO TABS
50.0000 mg | ORAL_TABLET | Freq: Two times a day (BID) | ORAL | Status: DC
  Administered 2020-09-15 – 2020-09-19 (×6): 50 mg via ORAL
  Filled 2020-09-15 (×9): qty 1

## 2020-09-15 NOTE — TOC Progression Note (Signed)
Transition of Care (TOC) - Progression Note    Patient Details  Name: [name]. MRN: [id number] Date of Birth: [birth year]  Transition of [facility]) CM/SW Contact  Shade Flood, LCSW Phone Number: 09/15/2020, 11:12 AM  Clinical Narrative:     TOC following. Pt status reviewed with MD in Progression Rounds. Per MD, pt not yet medically stable for dc. Pt with increasing Creatinine and decreasing urine output. Plan remains for dc to [facility] when medically stable. TOC will need to pursue new insurance authorization as current auth expires tomorrow and MD indicated pt is unlikely to dc before Thursday of this week.   Updated Claiborne Billings at [facility] TOC will follow.  Expected Discharge Plan: Home/Self Care Barriers to Discharge: Continued Medical Work up  Expected Discharge Plan and Services Expected Discharge Plan: Home/Self Care In-house Referral: Clinical Social Work     Living arrangements for the past 2 months: Single Family Home                                       Social Determinants of Health (SDOH) Interventions    Readmission Risk Interventions No flowsheet data found.

## 2020-09-15 NOTE — Consult Note (Signed)
PV Navigator consult acknowledged and chart reviewed.  Full thickness ulcer to R 5th toe with DG suggesting osteomyelitis. Patient declines ortho recommendation for 5th ray amputation at this time. Per ortho notes, patient will need to follow up with Dr Sharol Given on an outpatient basis post discharge and wear PRAFO boot at all times to offload pressure to wound also present on R heel.  Plan is for patient to go to SNF [facility]) for short term rehab/compression wraps. Patient declines any additional testing at this time.   PV Navigator will remain available to this patient for questions or additional barriers.  Thank you, Will follow.  Cletis Media RN BSN CWS [place]

## 2020-09-15 NOTE — Care Management Important Message (Signed)
Important Message  Patient Details IM Letter given to the Patient Name: [name]. MRN: [id number] Date of Birth: [birth year]   Medicare Important Message Given:  Yes     Kerin Salen 09/15/2020, 11:07 AM

## 2020-09-15 NOTE — Progress Notes (Signed)
[facility] Progress Note  Subjective: pt seen in room, up in chair, no SOB or cough or PND.   Vitals:   09/13/20 2130 09/14/20 0526 09/14/20 1300 09/14/20 2011  BP: 100/75 95/72 101/77 110/85  Pulse: 80 73 (!) 59 (!) 109  Resp: _0 Temp: 98 F (36.7 C) 97.9 F (36.6 C) 97.7 F (36.5 C) 98.2 F (36.8 C)  TempSrc: Oral Oral Oral Oral  SpO2: 96% 96% 98% 97%  Weight:      Height:        Exam:   alert, nad   no jvd  Chest cta bilat  Cor irreg irreg no MG  Abd soft ntnd no ascites   Ext sig woody edema 2-3+ lower legs, +1-2 upper legs   Alert, NF, ox3    UA 10/31 - 100 prot, 0-5 rbc/ wbc, rare bact    UNa < 40, UCr 166.2    RUS 10/31 - 14.5/ 14.6 cm kidneys w/o hydro, normal echotexture, normal bladder appearance, limited study d/t body habitus   Summary: 69 yo hx diast CHF, DM2, afib , HTN found down in [place] during welfare check, currently f/b APS. Pt reported fall in [place] same day. Pt reported stopping all medications mos ago except torsemide, "didn't think I needed them anymore". In ED had afib /RVR and diabetic foot infection, started on IV vanc/ cefepime, po metoprolol started and pt admitted. MRI showed OM R foot. B/L creat is 1.3- 1.4, creat here 1.9 on admit, then rose up to 27 for a few days then 3.72 yesterday. Asked to see for A/C renal failure  Assessment/ Plan: 1. AKI on CKD 3b - BL creatinine in the 1.3-1.9, eGFR 35- 51 ml/min.  Creatinine had risen from 1.96 at admission to 2.75-2.78 range for a few days before increasing now to 3.7 yest and 4.7 today. He was treated with Lasix 34m IV daily for a few days. Presentation BP was in the 112-120's range but since then has been in the 977-412'Isystolic range. Appears to have underlying CKD now an acute component as well which is likely related to the osteomyelitis + hemodynamics/ relative hypotension/ diuresis. UOP down 300 cc/day.  Renal UKoreashowed no signs of obstruction. UA was unremarkable. UNa was  lowish, UCr high. Vanc level was 10 (got 2 days IV vanc then dc'd). CXR 10/30 showed mild vasc congestion. F/U wt is pending. Total I/O is + 5 L since admit.  - get wt today, no signs of uremia. B/Cr rising quickly. [facility]     2. CHF with diastolic and systolic component w/ decreased EF of 40-45% 01/2020 3. HTN - relatively hypotensive at this time. BP's 90's - 100's. Not getting any BP lowering meds now.  4. T2DM - [place] 5. Afib w/ RVR - heart rates on the higher side, 110's -120's 6. Sepsis w/ osteomyelitis of the 5th toe on rt foot - augmentin/ doxy po  7. Chronic LE edema - legs wrapped     Rob Mayford Alberg 09/15/2020, 11:52 AM   Recent Labs  Lab 09/14/20 0607 09/14/20 1222 09/15/20 0436  K 3.4*  --  4.0  BUN 87*  --  88*  CREATININE 3.72*  --  4.79*  CALCIUM 7.9*  --  8.1*  PHOS  --  6.3* 7.0*  HGB 10.9*  --  11.4*   Inpatient medications: . (feeding supplement) PROSource Plus  30 mL Oral QID  .  amoxicillin-clavulanate  1 tablet Oral BID  . apixaban  5 mg Oral BID  . doxycycline  100 mg Oral Q12H  . insulin aspart  0-15 Units Subcutaneous TID WC  . insulin aspart  0-5 Units Subcutaneous QHS  . multivitamin with minerals  1 tablet Oral Daily    acetaminophen **OR** acetaminophen, HYDROmorphone (DILAUDID) injection, labetalol, melatonin

## 2020-09-15 NOTE — Progress Notes (Addendum)
PROGRESS NOTE    [name].   IFO:[id number]  DOB: Jul 05, 1951  DOA: 09/08/2020     7  PCP: Keith Dials, MD  CC: found down at home  Hospital Course: [name] is a 69 y.o. male with medical history significant of DM2, dCHF, PAF (supposed to be on Eliquis), HTN who presented to ED after being found down in the bathroom during welfare check.   Per report, patient noted to have an unsanitary living condition and is currently followed by adult protective services. Patient reports falling while standing up from bathroom without LOC, resulting in fall onto R chest and resultant R lower chest pain. Patient was found down and brought into ED.    Of note, patient reports stopping all medications several months ago with exception of torsemide because "I didn't think I needed them anymore." Pt has only been taking torsemide PRN swelling.  He was found to have necrotic foot infections and underwent xray of right foot, which revealed subcortical lucency involving 5th digit concerning for OM.  He was started on vancomycin and cefepime and orthopedic surgery was consulted. ABIs were also ordered.  Test was inconclusive due to severe lower extremity edema.   Initially patient was also against surgical treatment options despite severity of his wounds, their obvious chronicity, and inability to explain consequences for avoiding surgery. We requested psychiatry to evaluate patient for decision making ability in this setting. After repeat explanation of his diagnosis and possible treatment options he was able to voice adequate understanding to psychiatry and was considered to have decision making capacity regarding his current infection.   He will need to continue with serial compression wrappings at discharge in efforts to promote better chance of wound healing and proper assessment of ABIs to help decide best treatment course.   Given his significant deconditioning and after discussion with  his daughter, there is concern for his ability to safely return home due to poor living situation; Physical therapy was consulted. After PT evaluation he was recommended for SNF at discharge.  He was accepting of this and placement was pursued.  Overall, he had good clinical response to IV antibiotics.  He was deescalated to Augmentin and doxycycline to finish out a course. He will continue with serial compression wrappings at discharge to rehab as well as following up with Ortho for further management.  During hospitalization he was started on IV Lasix for diuresis given his severe lower extremity edema.  His creatinine continued to slowly uptrend.  Urine output measuring was inaccurate and difficult to gauge response. Nephrology was consulted on 09/14/2020 given his uptrending creatinine in setting of multiple antibiotics initially and some transient hypotension.   Interval History:  No events overnight. Daughter on the phone this am from [place]. She is planning to visit on Thursday and requests no visitors be allowed on Thursday so that she may be allowed to visit with patient.   He feels okay, no obvious SOB at this time. Legs remain grossly swollen and UOP has decreased.   Old records reviewed in assessment of this patient  ROS: Constitutional: negative for chills and fevers, Respiratory: negative for cough, Cardiovascular: negative for chest pain and Gastrointestinal: negative for abdominal pain  Assessment & Plan: * Osteomyelitis of fifth toe of right foot ([name]) - probable OM per xray; ABIs indeterminate due to LE edema - patient against inpatient surgery at this time regardless - patient needs serial compression wrappings to LE to aid with improving edema -  CRP 8.2, ESR 55. Will serve as baseline going forward  -Outpatient follow-up with orthopedic surgery -Discontinue IV antibiotics.  Start Augmentin and doxycycline.  will continue for 2-week course  Acute renal failure superimposed  on stage 3a chronic kidney disease (HCC) - baseline creatinine approx 1.2 - 1.3 - creatinine 1.98 on admission which has uptrended (now up to 4.7 on 11/1) and BUN rising as well. Appears to be acting like ATN at this time (multifactorial cause possibly some relative hypotension, additive effect of abx, intravascular volume depletion?) - continue trending creat to peak and hopeful for diuretic phase following; otherwise may approach need for HD soon in next 1-2 days; patient is aware of renal function and would want HD if needed he says - appreciate nephrology assistance - continue daily weights and strict I&O  Severe sepsis (HCC)-resolved as of 09/12/2020 -Tachycardic, tachypneic, elevated WBC, elevated lactic acid.  Source considered right foot -See osteomyelitis above  Chronic edema -Continue lower extremity compression wrappings.  Will follow up if able to set him up with outpatient wound care center versus home health or combination -This is preventing adequate ABI evaluation  Atrial fibrillation with RVR ([name]) - RVR considered in setting of sepsis on admission - responded to BB (patient noncompliant at home) - started on heparin drip on admission; given no upcoming surgery at this point, d/c heparin drip and resume Eliquis (will need to counsel patient on compliance at d/c once again) - now with need for possible HD and vascular access, will d/c Eliquis once again and place back on heparin drip for now  - resume home BB and adjust as necessary   Acute on chronic heart failure with preserved ejection fraction (HFpEF) ([name]) Most recent 2d echo from 3/21 with EF of 40-45%, unable to evaluate diastolic dysfunction at that time - lasix now on hold due to renal function - see AoCKD  Essential hypertension - has been mildly hypotensive/low normal without any medication - continue monitoring BP - lasix now on hold   Type 2 diabetes mellitus ([name]) - A1c 9.6% - continue SSI and CBG for  now - patient also noncompliant with regimen at home; states "I didn't think I need it", he is now amenable to restarting medications as prescribed   Antimicrobials: Vanc 10/25>>10/27 Zosyn 10/25 x 1 Cefepime 10/25>> 09/12/2020 Daptomycin 10/27>> 09/12/2020 Flagyl 10/27>> 09/12/2020 Augmentin 09/12/2020>> present Doxycycline 09/12/2020>> present   DVT prophylaxis: Eliquis Code Status: Full Family Communication: none present Disposition Plan: Status is: Inpatient  Remains inpatient appropriate because:Unsafe d/c plan, IV treatments appropriate due to intensity of illness or inability to take PO and Inpatient level of care appropriate due to severity of illness   Dispo: The patient is from: Home              Anticipated d/c is to: SNF              Anticipated d/c date is: > 3 days. Rehab postponed until renal function improves               Patient currently is not medically stable to d/c.  Objective: Blood pressure 109/84, pulse 75, temperature (!) 97.5 F (36.4 C), temperature source Oral, resp. rate 17, height $RemoveBe'5\' 9"'LkmOmbFVU$  (1.753 m), weight 124.7 kg, SpO2 99 %.  Examination: General appearance: Pleasant elderly man sitting in chair resting Head: Normocephalic, without obvious abnormality, atraumatic Eyes: EOMI Lungs: clear to auscultation bilaterally Heart: regular rate and rhythm and S1, S2 normal Abdomen: obese, soft, NT,  ND, BS present Extremities: B/L severe chronic 3+++ LE edema noted. Feet are noted with severe necrotic lesions ulcers on plantar surfaces. Right foot noted with large heel ulcer and ball of foot ulcer; legs are now wrapped in compression bandage Skin: mobility and turgor normal Neurologic: Grossly normal  Consultants:   Ortho  Nephrology   Procedures:   none  Data Reviewed: I have personally reviewed following labs and imaging studies Results for orders placed or performed during the hospital encounter of 09/08/20 (from the past 24 hour(s))    Glucose, capillary     Status: Abnormal   Collection Time: 09/14/20  5:00 PM  Result Value Ref Range   Glucose-Capillary 170 (H) 70 - 99 mg/dL  Glucose, capillary     Status: Abnormal   Collection Time: 09/14/20  8:08 PM  Result Value Ref Range   Glucose-Capillary 188 (H) 70 - 99 mg/dL  Basic metabolic panel     Status: Abnormal   Collection Time: 09/15/20  4:36 AM  Result Value Ref Range   Sodium 136 135 - 145 mmol/L   Potassium 4.0 3.5 - 5.1 mmol/L   Chloride 105 98 - 111 mmol/L   CO2 16 (L) 22 - 32 mmol/L   Glucose, Bld 187 (H) 70 - 99 mg/dL   BUN 88 (H) 8 - 23 mg/dL   Creatinine, Ser 4.79 (H) 0.61 - 1.24 mg/dL   Calcium 8.1 (L) 8.9 - 10.3 mg/dL   GFR, Estimated 12 (L) >60 mL/min   Anion gap 15 5 - 15  CBC with Differential/Platelet     Status: Abnormal   Collection Time: 09/15/20  4:36 AM  Result Value Ref Range   WBC 11.8 (H) 4.0 - 10.5 K/uL   RBC 4.20 (L) 4.22 - 5.81 MIL/uL   Hemoglobin 11.4 (L) 13.0 - 17.0 g/dL   HCT 37.9 (L) 39 - 52 %   MCV 90.2 80.0 - 100.0 fL   MCH 27.1 26.0 - 34.0 pg   MCHC 30.1 30.0 - 36.0 g/dL   RDW 14.7 11.5 - 15.5 %   Platelets 326 150 - 400 K/uL   nRBC 0.0 0.0 - 0.2 %   Neutrophils Relative % 71 %   Neutro Abs 8.5 (H) 1.7 - 7.7 K/uL   Lymphocytes Relative 13 %   Lymphs Abs 1.6 0.7 - 4.0 K/uL   Monocytes Relative 12 %   Monocytes Absolute 1.4 (H) 0.1 - 1.0 K/uL   Eosinophils Relative 2 %   Eosinophils Absolute 0.2 0.0 - 0.5 K/uL   Basophils Relative 1 %   Basophils Absolute 0.1 0.0 - 0.1 K/uL   Immature Granulocytes 1 %   Abs Immature Granulocytes 0.14 (H) 0.00 - 0.07 K/uL  Magnesium     Status: None   Collection Time: 09/15/20  4:36 AM  Result Value Ref Range   Magnesium 1.9 1.7 - 2.4 mg/dL  Phosphorus     Status: Abnormal   Collection Time: 09/15/20  4:36 AM  Result Value Ref Range   Phosphorus 7.0 (H) 2.5 - 4.6 mg/dL  Glucose, capillary     Status: Abnormal   Collection Time: 09/15/20  7:28 AM  Result Value Ref Range    Glucose-Capillary 157 (H) 70 - 99 mg/dL  Glucose, capillary     Status: Abnormal   Collection Time: 09/15/20 11:18 AM  Result Value Ref Range   Glucose-Capillary 150 (H) 70 - 99 mg/dL    Recent Results (from the past 240 hour(s))  Respiratory  Panel by RT PCR (Flu A&B, Covid) - Nasopharyngeal Swab     Status: None   Collection Time: 09/08/20  5:13 PM   Specimen: Nasopharyngeal Swab  Result Value Ref Range Status   SARS Coronavirus 2 by RT PCR NEGATIVE NEGATIVE Final    Comment: (NOTE) SARS-CoV-2 target nucleic acids are NOT DETECTED.  The SARS-CoV-2 RNA is generally detectable in upper respiratoy specimens during the acute phase of infection. The lowest concentration of SARS-CoV-2 viral copies this assay can detect is 131 copies/mL. A negative result does not preclude SARS-Cov-2 infection and should not be used as the sole basis for treatment or other patient management decisions. A negative result may occur with  improper specimen collection/handling, submission of specimen other than nasopharyngeal swab, presence of viral mutation(s) within the areas targeted by this assay, and inadequate number of viral copies (<131 copies/mL). A negative result must be combined with clinical observations, patient history, and epidemiological information. The expected result is Negative.  Fact Sheet for Patients:  PinkCheek.be  Fact Sheet for Healthcare Providers:  GravelBags.it  This test is no t yet approved or cleared by the [place] FDA and  has been authorized for detection and/or diagnosis of SARS-CoV-2 by FDA under an Emergency Use Authorization (EUA). This EUA will remain  in effect (meaning this test can be used) for the duration of the COVID-19 declaration under Section 564(b)(1) of the Act, 21 U.S.C. section 360bbb-3(b)(1), unless the authorization is terminated or revoked sooner.     Influenza A by PCR NEGATIVE  NEGATIVE Final   Influenza B by PCR NEGATIVE NEGATIVE Final    Comment: (NOTE) The Xpert Xpress SARS-CoV-2/FLU/RSV assay is intended as an aid in  the diagnosis of influenza from Nasopharyngeal swab specimens and  should not be used as a sole basis for treatment. Nasal washings and  aspirates are unacceptable for Xpert Xpress SARS-CoV-2/FLU/RSV  testing.  Fact Sheet for Patients: PinkCheek.be  Fact Sheet for Healthcare Providers: GravelBags.it  This test is not yet approved or cleared by the [place] FDA and  has been authorized for detection and/or diagnosis of SARS-CoV-2 by  FDA under an Emergency Use Authorization (EUA). This EUA will remain  in effect (meaning this test can be used) for the duration of the  Covid-19 declaration under Section 564(b)(1) of the Act, 21  U.S.C. section 360bbb-3(b)(1), unless the authorization is  terminated or revoked. Performed at [facility], [place] [address], [place], [place] [address]   Blood Cultures x 2 sites     Status: None   Collection Time: 09/08/20  7:04 PM   Specimen: BLOOD  Result Value Ref Range Status   Specimen Description   Final    BLOOD LEFT WRIST Performed at [place] [address], [place], [place] [address]    Special Requests   Final    BOTTLES DRAWN AEROBIC AND ANAEROBIC Blood Culture adequate volume Performed at [place] [facility] [address], [place], [place] [address]    Culture   Final    NO GROWTH 5 DAYS Performed at [facility], [place] [address], [place], [place] [address]    Report Status 09/13/2020 FINAL  Final  Culture, blood (x 2)     Status: None   Collection Time: 09/08/20  9:03 PM   Specimen: BLOOD LEFT HAND  Result Value Ref Range Status   Specimen Description   Final    BLOOD LEFT HAND Performed at [facility]  [facility], [place] [address] [place], [place]  [address]    Special Requests   Final    BOTTLES DRAWN AEROBIC AND ANAEROBIC Blood Culture adequate volume Performed at [place] [address] [place], [place] [address]    Culture   Final    NO GROWTH 5 DAYS Performed at [facility], [place] [address], [place],  [address]    Report Status 09/13/2020 FINAL  Final     Radiology Studies: US RENAL  Result Date: 09/14/2020 CLINICAL DATA:  Acute renal failure EXAM: RENAL / URINARY TRACT ULTRASOUND COMPLETE COMPARISON:  Abdominal ultrasound dated 02/02/2016. FINDINGS: The exam is limited due to patient's body habitus and ability to participate in the exam. Right Kidney: Renal measurements: 14.5 x 6.0 x 7.2 cm = volume: 332 mL. Echogenicity within normal limits. No mass or hydronephrosis visualized. Left Kidney: Renal measurements: 14.6 x 5.6 x 6.4 cm = volume: 276 mL. Echogenicity within normal limits. No mass or hydronephrosis visualized. Bladder: Appears normal for degree of bladder distention. IMPRESSION: No hydronephrosis given the limitations of the exam. Electronically Signed   By: Zerita Boers M.D.   On: 09/14/2020 15:07   US RENAL  Final Result    DG CHEST PORT 1 VIEW  Final Result    VAS [place] ABI WITH/WO TBI  Final Result    DG Foot Complete Right  Final Result      Scheduled Meds: . (feeding supplement) PROSource Plus  30 mL Oral QID  . amoxicillin-clavulanate  1 tablet Oral BID  . doxycycline  100 mg Oral Q12H  . insulin aspart  0-15 Units Subcutaneous TID WC  . insulin aspart  0-5 Units Subcutaneous QHS  . metoprolol tartrate  50 mg Oral BID  . multivitamin with minerals  1 tablet Oral Daily   PRN Meds: acetaminophen **OR** acetaminophen, HYDROmorphone (DILAUDID) injection, labetalol, melatonin Continuous Infusions: . heparin       LOS: 7 days  Time spent: Greater than 50% of the 35 minute visit was spent in counseling/coordination of care for the patient as laid out in the A&P.    Keith Dee, MD [facility] 09/15/2020, 1:31 PM

## 2020-09-15 NOTE — Progress Notes (Signed)
[place] for Eliquis >> Heparin bridge Indication: atrial fibrillation possible need for vascular access for HD  Allergies  Allergen Reactions  . Codeine Swelling    Water retention  . Sulfur Other (See Comments)    Not sure of reaction a young child    Patient Measurements: Height: 5\' 9"  (175.3 cm) Weight: 124.7 kg (275 lb) IBW/kg (Calculated) : 70.7 Heparin Dosing Weight: 93 kg  Vital Signs: Temp: 97.5 F (36.4 C) (11/01 1314) Temp Source: Oral (11/01 1314) BP: 109/84 (11/01 1314) Pulse Rate: 75 (11/01 1314)  Labs: Recent Labs    09/13/20 0526 09/13/20 0526 09/14/20 0607 09/14/20 1222 09/15/20 0436  HGB 11.2*   < > 10.9*  --  11.4*  HCT 37.2*  --  35.2*  --  37.9*  PLT 286  --  295  --  326  CREATININE 2.99*  --  3.72*  --  4.79*  CKTOTAL  --   --   --  41*  --    < > = values in this interval not displayed.    Estimated Creatinine Clearance: 18.5 mL/min (A) (by C-G formula based on SCr of 4.79 mg/dL (H)).   Medical History: Past Medical History:  Diagnosis Date  . Arthritis   . Back pain    occasionally  . BPH (benign prostatic hypertrophy)    takes Uroxatral daily as well as Finasteride   . Diabetes mellitus without complication ([place])    takes Metformin daily  . Diabetic foot infection ([place]) 10/22/2019  . GERD (gastroesophageal reflux disease)    no meds  . Hepatitis hx of   at age 46  . Hypertension    takes Losartan and HCTZ daily  . Joint pain   . Lymphedema 10/22/2019  . Urinary frequency   . Urinary urgency   . Venous stasis dermatitis 11/19/2019    Assessment: 69 y/o M with a h/o atrial fibrillation supposed to be on Eliquis admitted for OM. Patient was transitioned from IV UFH to Eliquis with no upcoming surgery. Pharmacy consulted to place patient back on heparin bridge with possible need for dialysis catheter.   Goal of Therapy:  Heparin level 0.3-0.7 units/ml  APTT 66-102s Monitor platelets by  anticoagulation protocol: Yes   Plan:  Start heparin infusion at 1600 units/hr 12 hours after last Eliquis dose Check aPTT and anti-Xa level 8 hours after initiating infusion Monitor by aPTT and check daily HL until levels correlate Continue to monitor H&H and platelets  Ulice Dash D 09/15/2020,1:32 PM

## 2020-09-15 NOTE — Progress Notes (Signed)
Pt's BLE dressing needs to be changed, MD is notified and wound consult is ordered. Dressing is reinforced.

## 2020-09-15 NOTE — Plan of Care (Signed)

## 2020-09-15 NOTE — Progress Notes (Addendum)
Physical Therapy Treatment Patient Details Name: [name]. MRN: [id number] DOB: [birth year] Today's Date: 09/15/2020    History of Present Illness 69 y.o. male with PMH of DM2, CHF, afib, HTN, R THA in 2016 presents to ED after being found down in the bathroom this AM during welfare check; xray found osteomyelitis of 5th toe on R foot.    PT Comments    Pt found seated in chair leaned forward and asleep. He has to be persuaded to participate in physical therapy today. Due to swelling of B LEs, pt donned with non-skid disposable hospital shoe covers for gait, as opposed to non-skid socks. He requires min A for sit to stand and demos increased hip flexion during gait. Pt utilizes RW for gait to restroom; min A for safety with gait. Stand to sit descend to toilet is smooth and controlled. He requires VC's for R hand placement on the handrail during standing pivot. Min A to stand from toilet and ambulate back to his chair; independent with self hygiene after urinating. Although sleepy, he declines lying in bed because he "doesn't like the bed". He inquires about someone coming to change his B LE wraps; he claims he "doesn't remember the last time someone came to change them". Nursing was notified.  Follow Up Recommendations  SNF     Equipment Recommendations  None recommended by PT    Recommendations for Other Services       Precautions / Restrictions Precautions Precautions: Fall Required Braces or Orthoses: Other Brace    Mobility  Bed Mobility Overal bed mobility: Needs Assistance                Transfers Overall transfer level: Needs assistance Equipment used: Rolling walker (2 wheeled)   Sit to Stand: Min assist            Ambulation/Gait Ambulation/Gait assistance: Min guard     Gait Pattern/deviations: Step-through pattern;Decreased stride length    Gait Distance to and from bathroom approx 16 feet  General gait details: pt wears disposable non-skid  hospital shoe covers during gait   Stairs             Wheelchair Mobility    Modified Rankin (Stroke Patients Only)       Balance                                            Cognition Arousal/Alertness: Awake/alert Behavior During Therapy: WFL for tasks assessed/performed Overall Cognitive Status: Within Functional Limits for tasks assessed                                 General Comments: pt A&O x4      Exercises      General Comments        Pertinent Vitals/Pain Pain Assessment: No/denies pain    Home Living                      Prior Function            PT Goals (current goals can now be found in the care plan section) Acute Rehab PT Goals Patient Stated Goal: "go back home" PT Goal Formulation: With patient Time For Goal Achievement: 09/25/20    Frequency    Min 3X/week  PT Plan      Co-evaluation              AM-PAC PT "6 Clicks" Mobility   Outcome Measure  Help needed turning from your back to your side while in a flat bed without using bedrails?: A Little Help needed moving from lying on your back to sitting on the side of a flat bed without using bedrails?: A Lot Help needed moving to and from a bed to a chair (including a wheelchair)?: A Lot Help needed standing up from a chair using your arms (e.g., wheelchair or bedside chair)?: A Lot Help needed to walk in hospital room?: A Lot Help needed climbing 3-5 steps with a railing? : Total 6 Click Score: 12    End of Session Equipment Utilized During Treatment: Gait belt Activity Tolerance: Patient tolerated treatment well Patient left: in chair;with call bell/phone within reach;with chair alarm set Nurse Communication: Mobility status;Other (comment) PT Visit Diagnosis: Unsteadiness on feet (R26.81);Other abnormalities of gait and mobility (R26.89);Muscle weakness (generalized) (M62.81)     Time: 1445-1510 PT Time Calculation  (min) (ACUTE ONLY): 25 min  Charges:  $Gait Training: 8-22 mins $Therapeutic Activity: 8-22 mins                     C. Parks [place], [facility] [phone]

## 2020-09-16 ENCOUNTER — Inpatient Hospital Stay (HOSPITAL_COMMUNITY): Payer: Medicare Other

## 2020-09-16 ENCOUNTER — Inpatient Hospital Stay: Payer: Medicare Other

## 2020-09-16 DIAGNOSIS — M86171 Other acute osteomyelitis, right ankle and foot: Secondary | ICD-10-CM | POA: Diagnosis not present

## 2020-09-16 DIAGNOSIS — Z23 Encounter for immunization: Secondary | ICD-10-CM

## 2020-09-16 DIAGNOSIS — G9341 Metabolic encephalopathy: Secondary | ICD-10-CM

## 2020-09-16 DIAGNOSIS — A419 Sepsis, unspecified organism: Principal | ICD-10-CM

## 2020-09-16 DIAGNOSIS — R6520 Severe sepsis without septic shock: Secondary | ICD-10-CM

## 2020-09-16 DIAGNOSIS — N170 Acute kidney failure with tubular necrosis: Secondary | ICD-10-CM

## 2020-09-16 DIAGNOSIS — E1165 Type 2 diabetes mellitus with hyperglycemia: Secondary | ICD-10-CM | POA: Diagnosis present

## 2020-09-16 DIAGNOSIS — I5033 Acute on chronic diastolic (congestive) heart failure: Secondary | ICD-10-CM | POA: Diagnosis not present

## 2020-09-16 DIAGNOSIS — I4891 Unspecified atrial fibrillation: Secondary | ICD-10-CM

## 2020-09-16 DIAGNOSIS — M869 Osteomyelitis, unspecified: Secondary | ICD-10-CM | POA: Diagnosis not present

## 2020-09-16 HISTORY — PX: IR FLUORO GUIDE CV LINE RIGHT: IMG2283

## 2020-09-16 HISTORY — PX: IR US GUIDE VASC ACCESS RIGHT: IMG2390

## 2020-09-16 LAB — CBC WITH DIFFERENTIAL/PLATELET
Abs Immature Granulocytes: 0.1 10*3/uL — ABNORMAL HIGH (ref 0.00–0.07)
Basophils Absolute: 0.1 10*3/uL (ref 0.0–0.1)
Basophils Relative: 1 %
Eosinophils Absolute: 0.2 10*3/uL (ref 0.0–0.5)
Eosinophils Relative: 2 %
HCT: 38.3 % — ABNORMAL LOW (ref 39.0–52.0)
Hemoglobin: 11.6 g/dL — ABNORMAL LOW (ref 13.0–17.0)
Immature Granulocytes: 1 %
Lymphocytes Relative: 15 %
Lymphs Abs: 1.6 10*3/uL (ref 0.7–4.0)
MCH: 26.9 pg (ref 26.0–34.0)
MCHC: 30.3 g/dL (ref 30.0–36.0)
MCV: 88.7 fL (ref 80.0–100.0)
Monocytes Absolute: 1.1 10*3/uL — ABNORMAL HIGH (ref 0.1–1.0)
Monocytes Relative: 11 %
Neutro Abs: 7.4 10*3/uL (ref 1.7–7.7)
Neutrophils Relative %: 70 %
Platelets: 320 10*3/uL (ref 150–400)
RBC: 4.32 MIL/uL (ref 4.22–5.81)
RDW: 14.8 % (ref 11.5–15.5)
WBC: 10.5 10*3/uL (ref 4.0–10.5)
nRBC: 0 % (ref 0.0–0.2)

## 2020-09-16 LAB — BASIC METABOLIC PANEL
Anion gap: 13 (ref 5–15)
BUN: 105 mg/dL — ABNORMAL HIGH (ref 8–23)
CO2: 16 mmol/L — ABNORMAL LOW (ref 22–32)
Calcium: 7.9 mg/dL — ABNORMAL LOW (ref 8.9–10.3)
Chloride: 106 mmol/L (ref 98–111)
Creatinine, Ser: 5.91 mg/dL — ABNORMAL HIGH (ref 0.61–1.24)
GFR, Estimated: 10 mL/min — ABNORMAL LOW (ref >60)
Glucose, Bld: 168 mg/dL — ABNORMAL HIGH (ref 70–99)
Potassium: 4.3 mmol/L (ref 3.5–5.1)
Sodium: 135 mmol/L (ref 135–145)

## 2020-09-16 LAB — GLUCOSE, CAPILLARY
Glucose-Capillary: 148 mg/dL — ABNORMAL HIGH (ref 70–99)
Glucose-Capillary: 173 mg/dL — ABNORMAL HIGH (ref 70–99)
Glucose-Capillary: 164 mg/dL — ABNORMAL HIGH (ref 70–99)
Glucose-Capillary: 165 mg/dL — ABNORMAL HIGH (ref 70–99)

## 2020-09-16 LAB — HEPARIN LEVEL (UNFRACTIONATED)
Heparin Unfractionated: 0.74 IU/mL — ABNORMAL HIGH (ref 0.30–0.70)
Heparin Unfractionated: 0.51 IU/mL (ref 0.30–0.70)
Heparin Unfractionated: 0.45 IU/mL (ref 0.30–0.70)

## 2020-09-16 LAB — APTT
aPTT: 50 seconds — ABNORMAL HIGH (ref 24–36)
aPTT: 74 seconds — ABNORMAL HIGH (ref 24–36)
aPTT: 96 seconds — ABNORMAL HIGH (ref 24–36)

## 2020-09-16 LAB — MAGNESIUM: Magnesium: 2 mg/dL (ref 1.7–2.4)

## 2020-09-16 LAB — PHOSPHORUS: Phosphorus: 8 mg/dL — ABNORMAL HIGH (ref 2.5–4.6)

## 2020-09-16 MED ORDER — LIDOCAINE HCL 1 % IJ SOLN
INTRAMUSCULAR | Status: AC
  Filled 2020-09-16: qty 20

## 2020-09-16 MED ORDER — CHLORHEXIDINE GLUCONATE CLOTH 2 % EX PADS
6.0000 | MEDICATED_PAD | Freq: Every day | CUTANEOUS | Status: DC
  Administered 2020-09-16 – 2020-09-23 (×7): 6 via TOPICAL

## 2020-09-16 MED ORDER — HEPARIN SOD (PORK) LOCK FLUSH 100 UNIT/ML IV SOLN
INTRAVENOUS | Status: AC
  Filled 2020-09-16: qty 5

## 2020-09-16 MED ORDER — SODIUM CHLORIDE 0.9% FLUSH
10.0000 mL | INTRAVENOUS | Status: DC | PRN
  Administered 2020-09-21: 10 mL

## 2020-09-16 MED ORDER — LIDOCAINE HCL 1 % IJ SOLN
INTRAMUSCULAR | Status: DC | PRN
  Administered 2020-09-16: 10 mL via INTRADERMAL

## 2020-09-16 MED ORDER — HEPARIN SODIUM (PORCINE) 1000 UNIT/ML IJ SOLN
INTRAMUSCULAR | Status: AC
  Filled 2020-09-16: qty 1

## 2020-09-16 MED ORDER — CHLORHEXIDINE GLUCONATE CLOTH 2 % EX PADS
6.0000 | MEDICATED_PAD | Freq: Every day | CUTANEOUS | Status: DC
  Administered 2020-09-17 – 2020-09-20 (×3): 6 via TOPICAL

## 2020-09-16 MED ORDER — HEPARIN (PORCINE) 25000 UT/250ML-% IV SOLN
1500.0000 [IU]/h | INTRAVENOUS | Status: DC
  Administered 2020-09-17 – 2020-09-21 (×8): 1750 [IU]/h via INTRAVENOUS
  Filled 2020-09-16 (×10): qty 250

## 2020-09-16 NOTE — Progress Notes (Signed)
[place] for Eliquis >> Heparin bridge Indication: atrial fibrillation possible need for vascular access for HD  Allergies  Allergen Reactions  . Codeine Swelling    Water retention  . Sulfur Other (See Comments)    Not sure of reaction a young child    Patient Measurements: Height: 5\' 9"  (175.3 cm) Weight: 134.4 kg (296 lb 4.8 oz) IBW/kg (Calculated) : 70.7 Heparin Dosing Weight: 93 kg  Vital Signs: Temp: 97.8 F (36.6 C) (11/01 2225) Temp Source: Oral (11/01 2225) BP: 101/59 (11/01 2225) Pulse Rate: 63 (11/01 2225)  Labs: Recent Labs    09/14/20 0607 09/14/20 0607 09/14/20 1222 09/15/20 0436 09/16/20 0456  HGB 10.9*   < >  --  11.4* 11.6*  HCT 35.2*  --   --  37.9* 38.3*  PLT 295  --   --  326 320  APTT  --   --   --   --  50*  HEPARINUNFRC  --   --   --   --  0.74*  CREATININE 3.72*  --   --  4.79*  --   CKTOTAL  --   --  41*  --   --    < > = values in this interval not displayed.    Estimated Creatinine Clearance: 19.2 mL/min (A) (by C-G formula based on SCr of 4.79 mg/dL (H)).   Medical History: Past Medical History:  Diagnosis Date  . Arthritis   . Back pain    occasionally  . BPH (benign prostatic hypertrophy)    takes Uroxatral daily as well as Finasteride   . Diabetes mellitus without complication ([place])    takes Metformin daily  . Diabetic foot infection ([place]) 10/22/2019  . GERD (gastroesophageal reflux disease)    no meds  . Hepatitis hx of   at age 69  . Hypertension    takes Losartan and HCTZ daily  . Joint pain   . Lymphedema 10/22/2019  . Urinary frequency   . Urinary urgency   . Venous stasis dermatitis 11/19/2019    Assessment: 69 y/o M with a h/o atrial fibrillation supposed to be on Eliquis admitted for OM. Patient was transitioned from IV UFH to Eliquis with no upcoming surgery. Pharmacy consulted to place patient back on heparin bridge with possible need for dialysis catheter.    09/16/2020 APTT 50 seconds subtherapeutic on 1600 units/hr Hgb 11.6 plts 320  Goal of Therapy:  Heparin level 0.3-0.7 units/ml  APTT 66-102s Monitor platelets by anticoagulation protocol: Yes   Plan:  Increase heparin drip to 1800 units/hr Check aPTT and anti-Xa level 8 hours after initiating infusion Monitor by aPTT and check daily HL until levels correlate Continue to monitor H&H and platelets  Dolly Rias RPh 09/16/2020, 5:57 AM

## 2020-09-16 NOTE — Progress Notes (Signed)
[facility] for IV heparin Indication: atrial fibrillation   Allergies  Allergen Reactions  . Codeine Swelling    Water retention  . Sulfur Other (See Comments)    Not sure of reaction a young child    Patient Measurements: Height: 5\' 9"  (175.3 cm) Weight: 134.4 kg (296 lb 4.8 oz) IBW/kg (Calculated) : 70.7 Heparin Dosing Weight: 93 kg  Vital Signs: Temp: 97.6 F (36.4 C) (11/02 2016) Temp Source: Oral (11/02 2016) BP: 118/73 (11/02 2016) Pulse Rate: 101 (11/02 2016)  Labs: Recent Labs    09/14/20 8250 09/14/20 0607 09/14/20 1222 09/15/20 0436 09/16/20 0456 09/16/20 1359 09/16/20 2043  HGB 10.9*   < >  --  11.4* 11.6*  --   --   HCT 35.2*  --   --  37.9* 38.3*  --   --   PLT 295  --   --  326 320  --   --   APTT  --   --   --   --  50* 74* 96*  HEPARINUNFRC  --   --   --   --  0.74* 0.51 0.45  CREATININE 3.72*  --   --  4.79* 5.91*  --   --   CKTOTAL  --   --  41*  --   --   --   --    < > = values in this interval not displayed.    Estimated Creatinine Clearance: 15.6 mL/min (A) (by C-G formula based on SCr of 5.91 mg/dL (H)).   Medical History: Past Medical History:  Diagnosis Date  . Arthritis   . Back pain    occasionally  . BPH (benign prostatic hypertrophy)    takes Uroxatral daily as well as Finasteride   . Diabetes mellitus without complication ([place])    takes Metformin daily  . Diabetic foot infection ([place]) 10/22/2019  . GERD (gastroesophageal reflux disease)    no meds  . Hepatitis hx of   at age 69  . Hypertension    takes Losartan and HCTZ daily  . Joint pain   . Lymphedema 10/22/2019  . Urinary frequency   . Urinary urgency   . Venous stasis dermatitis 11/19/2019    Assessment: 69 y/o M with a h/o atrial fibrillation supposed to be on Eliquis admitted for OM. Patient was transitioned from IV UFH to Eliquis with no upcoming surgery. Pharmacy consulted to place patient back on heparin bridge due to  possible need for hemodialysis catheter.   Today, 09/16/20:  Placement of HD catheter done in IR today  Heparin infusion resumed at 1300 this afternoon  PM heparin level = 0.45 units/mL-therapeutic, aPTT = 96 seconds-on upper end of therapeutic range  CBC: Hgb 11.6, stable. Pltc WNL.   No bleeding or infusion issues noted per nursing   Goal of Therapy:  Heparin level 0.3-0.7 units/ml  aPTT 66-102 seconds Monitor platelets by anticoagulation protocol: Yes   Plan:   Decrease heparin infusion slightly to 1750 units/hr  Check aPTT and heparin level 8 hours after rate change  Anticipate will be able to stop checking aPTT and use heparin levels only after next lab draw  Monitor closely for s/sx of bleeding    Lindell Spar, PharmD, BCPS Clinical Pharmacist  09/16/2020,9:23 PM

## 2020-09-16 NOTE — Plan of Care (Signed)

## 2020-09-16 NOTE — Procedures (Signed)
Interventional Radiology Procedure Note  Procedure: Placement of a right IJ approach non-tunneled Hd catheter. 20cm.  Tip is positioned at the superior cavoatrial junction and catheter is ready for immediate use.  Complications: None Recommendations:  - Ok to use - Do not submerge - Routine line care   Signed,  Dulcy Fanny. Earleen Newport, DO

## 2020-09-16 NOTE — Consult Note (Signed)
WOC Nurse Consult Note: Requested to consult on patient's LE wounds. Patient was seen by my associate, D. Barbie Haggis twice last week and by Dr. Sharol Given who established the POC and who will follow this patient post discharge.  WOC Nursing is seeing weekly on Wednesdays for [place] dressing changes. Plan to see tomorrow.  Thanks, Maudie Flakes, MSN, RN, [place], Arther Abbott  Pager# [phone]

## 2020-09-16 NOTE — Progress Notes (Signed)
PROGRESS NOTE    [place].  ZYY:[id number] DOB: [birth year] DOA: 09/08/2020 PCP: Aura Dials, MD     Brief Narrative:  69 y.o.WM PMHx DM2, dCHF, PAF (supposed to be on Eliquis), HTN    who presented to ED after being found down in the bathroom during welfare check.   Per report, patient noted to have an unsanitary living condition and is currently followed by adult protective services.Patient reports falling while standing up from bathroom without LOC, resulting in fall onto R chest and resultant R lower chest pain. Patient was found down and brought into ED.   Of note, patient reports stopping all medications several months ago with exception of torsemide because "I didn't think I needed them anymore." Pt has only been taking torsemide PRN swelling.  He was found to have necrotic foot infections and underwent xray of right foot, which revealed subcortical lucency involving 5th digit concerning for OM.  He was started on vancomycin and cefepime and orthopedic surgery was consulted. ABIs were also ordered.  Test was inconclusive due to severe lower extremity edema.   Initially patient was also against surgical treatment options despite severity of his wounds, their obvious chronicity, and inability to explain consequences for avoiding surgery. We requested psychiatry to evaluate patient for decision making ability in this setting. After repeat explanation of his diagnosis and possible treatment options he was able to voice adequate understanding to psychiatry and was considered to have decision making capacity regarding his current infection.   He will need to continue with serial compression wrappings at discharge in efforts to promote better chance of wound healing and proper assessment of ABIs to help decide best treatment course.   Given his significant deconditioning and after discussion with his daughter, there is concern for his ability to safely return home due  to poor living situation; Physical therapy was consulted. After PT evaluation he was recommended for SNF at discharge.  He was accepting of this and placement was pursued.  Overall, he had good clinical response to IV antibiotics.  He was deescalated to Augmentin and doxycycline to finish out a course. He will continue with serial compression wrappings at discharge to rehab as well as following up with Ortho for further management.  During hospitalization he was started on IV Lasix for diuresis given his severe lower extremity edema.  His creatinine continued to slowly uptrend.  Urine output measuring was inaccurate and difficult to gauge response. Nephrology was consulted on 09/14/2020 given his uptrending creatinine in setting of multiple antibiotics initially and some transient hypotension   Subjective: Afebrile overnight sleepy but arousable, A/O x4 (uremic).  Negative CP, negative abdominal pain, negative lower extremity pain.  Patient's only complaint is increased fatigue.   Assessment & Plan: Covid vaccination;   Principal Problem:   Osteomyelitis of fifth toe of right foot ([place]) Active Problems:   Type 2 diabetes mellitus ([place])   Essential hypertension   Acute on chronic heart failure with preserved ejection fraction (HFpEF) (HCC)   Diabetic foot infection (HCC)   Atrial fibrillation with RVR (HCC)   Acute renal failure superimposed on stage 3a chronic kidney disease (HCC)   Chronic edema   Pressure injury of skin   Acute metabolic encephalopathy   Uncontrolled type 2 diabetes mellitus with hyperglycemia (HCC)   Severe sepsis (HCC)-resolved as of 09/12/2020 -Tachycardic, tachypneic, elevated WBC, elevated lactic acid.  Source considered right foot -See osteomyelitis above  Osteomyelitis of fifth toe of right foot ([place]) -  probable OM per xray; ABIs indeterminate due to LE edema - patient against inpatient surgery at this time regardless - patient needs serial  compression wrappings to LE to aid with improving edema - CRP 8.2, ESR 55. Will serve as baseline going forward  -Outpatient follow-up with orthopedic surgery -Discontinue IV antibiotics.  Start Augmentin and doxycycline.  will continue for 2-week course  Acute renal failure superimposed on CKD stage III a( baseline Cr ~1.2 - 1.3) Lab Results  Component Value Date   CREATININE 5.91 (H) 09/16/2020   CREATININE 4.79 (H) 09/15/2020   CREATININE 3.72 (H) 09/14/2020   CREATININE 2.99 (H) 09/13/2020   CREATININE 2.75 (H) 09/12/2020  -Discussed case with Dr. Roney Jaffe nephrology concurs that patient needs to be transferred to [facility] for hemod[facility]. -Dr. Roney Jaffe nephrology will see patient here at [facility] and have Vas-Cath placed prior to transfer -Strict in and out; +6.3 L   Acute metabolic encephalopathy -Secondary to uremia.  Patient sleepy but arousable answers questions appropriately. -Treat underlying causes with hemodialysis   Chronic edema -Edema most likely multifactorial acute on chronic CHF, patient does not know his base weight, does not weigh himself daily.  Acute on CKD stage IIIa.  A. fib with RVR.  Noncompliance.  Treat underlying causes -Continue lower extremity compression wrappings.  Will follow up if able to set him up with outpatient wound care center versus home health or combination -This is preventing adequate ABI evaluation -Edema most likely secondary to  Atrial fibrillation with RVR ([place]) - RVR considered in setting of sepsis on admission - responded to BB (patient noncompliant at home) -11/2 Heparin drip (Was on Eliquis at home).  However requires placement of Vas-Cath for HD -Metoprolol 50 mg BID  Acute on chronic heart failure with preserved ejection fraction (HFpEF) ([place]) -Most recent 2d echo from 3/21 with EF of 40-45%, unable to evaluate diastolic dysfunction at that time - lasix now on hold due to renal function - see  AoCKD  Essential hypertension - has been mildly hypotensive/low normal without any medication - continue monitoring BP  Type 2 diabetes mellitus uncontrolled with hyperglycemia (HCC) -10/25 hemoglobin A1c =9.6% - continue SSI and CBG for now - patient also noncompliant with regimen at home; states "I didn't think I need it", he is now amenable to restarting medications as prescribed   DVT prophylaxis: Heparin drip Code Status: Full  Family Communication:  Status is: Inpatient    Dispo: The patient is from: Home              Anticipated d/c is to: Home              Anticipated d/c date is: 11/7              Patient currently unstable      Consultants:  Nephrology   Procedures/Significant Events:    I have personally reviewed and interpreted all radiology studies and my findings are as above.  VENTILATOR SETTINGS:    Cultures 10/25/coronavirus negative 10/25 influenza A/B negative 10/25 HIV screen negative  Antimicrobials: Anti-infectives (From admission, onward)   Start     Ordered Stop   09/14/20 2200  amoxicillin-clavulanate (AUGMENTIN) 500-125 MG per tablet 500 mg        09/14/20 1524     09/12/20 1000  amoxicillin-clavulanate (AUGMENTIN) 875-125 MG per tablet 1 tablet  Status:  Discontinued        09/12/20 0823 09/14/20 1524   09/12/20 1000  doxycycline (  VIBRA-TABS) tablet 100 mg        09/12/20 0823     09/10/20 2000  DAPTOmycin (CUBICIN) 740 mg in sodium chloride 0.9 % IVPB  Status:  Discontinued        09/10/20 1230 09/12/20 0822   09/10/20 1200  metroNIDAZOLE (FLAGYL) IVPB 500 mg  Status:  Discontinued        09/10/20 1050 09/12/20 0822   09/10/20 1132  vancomycin variable dose per unstable renal function (pharmacist dosing)  Status:  Discontinued        09/10/20 1132 09/10/20 1230   09/09/20 1700  vancomycin (VANCOREADY) IVPB 1500 mg/300 mL  Status:  Discontinued        09/08/20 1956 09/10/20 1132   09/09/20 1000  ceFEPIme (MAXIPIME) 2 g in  sodium chloride 0.9 % 100 mL IVPB  Status:  Discontinued        09/08/20 1947 09/12/20 0822   09/08/20 2200  ceFEPIme (MAXIPIME) 2 g in sodium chloride 0.9 % 100 mL IVPB        09/08/20 1754 09/08/20 2206   09/08/20 1800  vancomycin (VANCOCIN) IVPB 1000 mg/200 mL premix  Status:  Discontinued        09/08/20 1754 09/08/20 1756   09/08/20 1630  vancomycin (VANCOREADY) IVPB 2000 mg/400 mL        09/08/20 1551 09/08/20 1842   09/08/20 1600  piperacillin-tazobactam (ZOSYN) IVPB 3.375 g        09/08/20 1549 09/08/20 1645       Devices    LINES / TUBES:      Continuous Infusions: . heparin 1,800 Units/hr (09/16/20 1552)     Objective: Vitals:   09/15/20 2225 09/16/20 0618 09/16/20 1351 09/16/20 2016  BP: (!) 101/59 92/68 117/81 118/73  Pulse: 63 (!) 59 (!) 101 (!) 101  Resp: $Remo'16 18 16 18  'dXlSz$ Temp: 97.8 F (36.6 C) 97.7 F (36.5 C) 98.1 F (36.7 C) 97.6 F (36.4 C)  TempSrc: Oral Oral Oral Oral  SpO2: 97% 97% 96% 97%  Weight:      Height:        Intake/Output Summary (Last 24 hours) at 09/16/2020 2044 Last data filed at 09/16/2020 1900 Gross per 24 hour  Intake 912.61 ml  Output --  Net 912.61 ml   Filed Weights   09/08/20 1438 09/15/20 1512  Weight: 124.7 kg 134.4 kg    Examination:  General: Sleepy but arousable, No acute respiratory distress Eyes: negative scleral hemorrhage, negative anisocoria, negative icterus ENT: Negative Runny nose, negative gingival bleeding, Neck:  Negative scars, masses, torticollis, lymphadenopathy, JVD Lungs: Clear to auscultation bilaterally without wheezes or crackles Cardiovascular: Regular rate and rhythm without murmur gallop or rub normal S1 and S2 Abdomen: negative abdominal pain, nondistended, positive soft, bowel sounds, no rebound, no ascites, no appreciable mass, anasarca Extremities: No significant cyanosis, clubbing, positive anasarca, bilateral lower extremities with dressings and wraps did not take down.  Foul  odor Skin: Osteomyelitis right foot Psychiatric:  Negative depression, negative anxiety, negative fatigue, negative mania  Central nervous system:  Cranial nerves II through XII intact, tongue/uvula midline, all extremities muscle strength 5/5, sensation intact throughout,  negative dysarthria, negative expressive aphasia, negative receptive aphasia.  .     Data Reviewed: Care during the described time interval was provided by me .  I have reviewed this patient's available data, including medical history, events of note, physical examination, and all test results as part of my evaluation.  CBC: Recent Labs  Lab 09/12/20 0416 09/13/20 0526 09/14/20 0607 09/15/20 0436 09/16/20 0456  WBC 10.4 10.8* 10.8* 11.8* 10.5  NEUTROABS 7.5 8.0* 8.0* 8.5* 7.4  HGB 10.9* 11.2* 10.9* 11.4* 11.6*  HCT 34.9* 37.2* 35.2* 37.9* 38.3*  MCV 86.8 88.8 87.3 90.2 88.7  PLT 246 286 295 326 761   Basic Metabolic Panel: Recent Labs  Lab 09/12/20 0416 09/13/20 0526 09/14/20 0607 09/14/20 1222 09/15/20 0436 09/16/20 0456  NA 131* 136 133*  --  136 135  K 3.2* 3.5 3.4*  --  4.0 4.3  CL 100 104 102  --  105 106  CO2 15* 17* 17*  --  16* 16*  GLUCOSE 173* 143* 233*  --  187* 168*  BUN 68* 80* 87*  --  88* 105*  CREATININE 2.75* 2.99* 3.72*  --  4.79* 5.91*  CALCIUM 7.6* 7.9* 7.9*  --  8.1* 7.9*  MG 1.7 1.9 1.9  --  1.9 2.0  PHOS  --   --   --  6.3* 7.0* 8.0*   GFR: Estimated Creatinine Clearance: 15.6 mL/min (A) (by C-G formula based on SCr of 5.91 mg/dL (H)). Liver Function Tests: Recent Labs  Lab 09/10/20 0450  AST 20  ALT 16  ALKPHOS 72  BILITOT 0.7  PROT 7.1  ALBUMIN 2.4*   No results for input(s): LIPASE, AMYLASE in the last 168 hours. No results for input(s): AMMONIA in the last 168 hours. Coagulation Profile: No results for input(s): INR, PROTIME in the last 168 hours. Cardiac Enzymes: Recent Labs  Lab 09/14/20 1222  CKTOTAL 41*   BNP (last 3 results) No results for  input(s): PROBNP in the last 8760 hours. HbA1C: No results for input(s): HGBA1C in the last 72 hours. CBG: Recent Labs  Lab 09/15/20 2228 09/16/20 0732 09/16/20 1119 09/16/20 1611 09/16/20 2020  GLUCAP 152* 148* 173* 164* 165*   Lipid Profile: No results for input(s): CHOL, HDL, LDLCALC, TRIG, CHOLHDL, LDLDIRECT in the last 72 hours. Thyroid Function Tests: No results for input(s): TSH, T4TOTAL, FREET4, T3FREE, THYROIDAB in the last 72 hours. Anemia Panel: No results for input(s): VITAMINB12, FOLATE, FERRITIN, TIBC, IRON, RETICCTPCT in the last 72 hours. Sepsis Labs: No results for input(s): PROCALCITON, LATICACIDVEN in the last 168 hours.  Recent Results (from the past 240 hour(s))  Respiratory Panel by RT PCR (Flu A&B, Covid) - Nasopharyngeal Swab     Status: None   Collection Time: 09/08/20  5:13 PM   Specimen: Nasopharyngeal Swab  Result Value Ref Range Status   SARS Coronavirus 2 by RT PCR NEGATIVE NEGATIVE Final    Comment: (NOTE) SARS-CoV-2 target nucleic acids are NOT DETECTED.  The SARS-CoV-2 RNA is generally detectable in upper respiratoy specimens during the acute phase of infection. The lowest concentration of SARS-CoV-2 viral copies this assay can detect is 131 copies/mL. A negative result does not preclude SARS-Cov-2 infection and should not be used as the sole basis for treatment or other patient management decisions. A negative result may occur with  improper specimen collection/handling, submission of specimen other than nasopharyngeal swab, presence of viral mutation(s) within the areas targeted by this assay, and inadequate number of viral copies (<131 copies/mL). A negative result must be combined with clinical observations, patient history, and epidemiological information. The expected result is Negative.  Fact Sheet for Patients:  PinkCheek.be  Fact Sheet for Healthcare Providers:   GravelBags.it  This test is no t yet approved or cleared by the [place] FDA and  has been  authorized for detection and/or diagnosis of SARS-CoV-2 by FDA under an Emergency Use Authorization (EUA). This EUA will remain  in effect (meaning this test can be used) for the duration of the COVID-19 declaration under Section 564(b)(1) of the Act, 21 U.S.C. section 360bbb-3(b)(1), unless the authorization is terminated or revoked sooner.     Influenza A by PCR NEGATIVE NEGATIVE Final   Influenza B by PCR NEGATIVE NEGATIVE Final    Comment: (NOTE) The Xpert Xpress SARS-CoV-2/FLU/RSV assay is intended as an aid in  the diagnosis of influenza from Nasopharyngeal swab specimens and  should not be used as a sole basis for treatment. Nasal washings and  aspirates are unacceptable for Xpert Xpress SARS-CoV-2/FLU/RSV  testing.  Fact Sheet for Patients: PinkCheek.be  Fact Sheet for Healthcare Providers: GravelBags.it  This test is not yet approved or cleared by the [place] FDA and  has been authorized for detection and/or diagnosis of SARS-CoV-2 by  FDA under an Emergency Use Authorization (EUA). This EUA will remain  in effect (meaning this test can be used) for the duration of the  Covid-19 declaration under Section 564(b)(1) of the Act, 21  U.S.C. section 360bbb-3(b)(1), unless the authorization is  terminated or revoked. Performed at [facility], [place] [address] [place], [place] [address]   Blood Cultures x 2 sites     Status: None   Collection Time: 09/08/20  7:04 PM   Specimen: BLOOD  Result Value Ref Range Status   Specimen Description   Final    BLOOD LEFT WRIST Performed at [facility] [address] [place], [place] [address]    Special Requests   Final    BOTTLES DRAWN AEROBIC AND ANAEROBIC Blood Culture adequate volume Performed at  [facility] [address] [place], [place] [address]    Culture   Final    NO GROWTH 5 DAYS Performed at [facility], [place] [address], [place], [place] [address]    Report Status 09/13/2020 FINAL  Final  Culture, blood (x 2)     Status: None   Collection Time: 09/08/20  9:03 PM   Specimen: BLOOD LEFT HAND  Result Value Ref Range Status   Specimen Description   Final    BLOOD LEFT HAND Performed at [facility] [address] [place], [place] [address]    Special Requests   Final    BOTTLES DRAWN AEROBIC AND ANAEROBIC Blood Culture adequate volume Performed at [facility] [address] [place], [place] [address]    Culture   Final    NO GROWTH 5 DAYS Performed at [facility], [place] [address], [place], [place] [address]    Report Status 09/13/2020 FINAL  Final         Radiology Studies: IR Fluoro Guide CV Line Right  Result Date: 09/16/2020 INDICATION: 69 year old male with a history of renal failure EXAM: IMAGE GUIDED PLACEMENT OF NON TUNNELED HEMODIALYSIS CATHETER MEDICATIONS: None ANESTHESIA/SEDATION: None FLUOROSCOPY TIME:  Fluoroscopy Time: 1 minutes 42 seconds (52 mGy). COMPLICATIONS: None PROCEDURE: Informed written consent was obtained from the patient and the patient's family after a thorough discussion of the procedural risks, benefits and alternatives. All questions were addressed. A timeout was performed prior to the initiation of the procedure. The right neck and chest was prepped with chlorhexidine, and draped in the usual sterile fashion using maximum barrier technique (cap and mask, sterile gown, sterile gloves, large sterile sheet,  hand hygiene and cutaneous antiseptic). Local anesthesia was attained by infiltration with 1% lidocaine without epinephrine. Ultrasound demonstrated patency of the right internal jugular vein, and this was documented with an image. Under real-time ultrasound  guidance, this vein was accessed with a 21 gauge micropuncture needle and image documentation was performed. A small dermatotomy was made at the access site with an 11 scalpel. A 0.018" wire was advanced into the SVC and the access needle exchanged for a 48F micropuncture vascular sheath. The 0.018" wire was then removed and a 0.035" wire advanced into the IVC. A 20 cm catheter was selected. Skin and subcutaneous tissues were serially dilated. Catheter was placed on the wire. The catheter tip is positioned in the upper right atrium. This was documented with a spot image. Both ports of the hemodialysis catheter were then tested for excellent function. The ports were then locked with heparinized lock. Patient tolerated the procedure well and remained hemodynamically stable throughout. No complications were encountered and no significant blood loss was encountered. IMPRESSION: Status post image guided placement temporary hemodialysis catheter via right IJ approach Signed, Dulcy Fanny. Dellia Nims, RPVI [facility] Radiology Electronically Signed   By: Corrie Mckusick D.O.   On: 09/16/2020 12:45   IR US Guide Vasc Access Right  Result Date: 09/16/2020 INDICATION: 69 year old male with a history of renal failure EXAM: IMAGE GUIDED PLACEMENT OF NON TUNNELED HEMODIALYSIS CATHETER MEDICATIONS: None ANESTHESIA/SEDATION: None FLUOROSCOPY TIME:  Fluoroscopy Time: 1 minutes 42 seconds (52 mGy). COMPLICATIONS: None PROCEDURE: Informed written consent was obtained from the patient and the patient's family after a thorough discussion of the procedural risks, benefits and alternatives. All questions were addressed. A timeout was performed prior to the initiation of the procedure. The right neck and chest was prepped with chlorhexidine, and draped in the usual sterile fashion using maximum barrier technique (cap and mask, sterile gown, sterile gloves, large sterile sheet, hand hygiene and  cutaneous antiseptic). Local anesthesia was attained by infiltration with 1% lidocaine without epinephrine. Ultrasound demonstrated patency of the right internal jugular vein, and this was documented with an image. Under real-time ultrasound guidance, this vein was accessed with a 21 gauge micropuncture needle and image documentation was performed. A small dermatotomy was made at the access site with an 11 scalpel. A 0.018" wire was advanced into the SVC and the access needle exchanged for a 48F micropuncture vascular sheath. The 0.018" wire was then removed and a 0.035" wire advanced into the IVC. A 20 cm catheter was selected. Skin and subcutaneous tissues were serially dilated. Catheter was placed on the wire. The catheter tip is positioned in the upper right atrium. This was documented with a spot image. Both ports of the hemodialysis catheter were then tested for excellent function. The ports were then locked with heparinized lock. Patient tolerated the procedure well and remained hemodynamically stable throughout. No complications were encountered and no significant blood loss was encountered. IMPRESSION: Status post image guided placement temporary hemodialysis catheter via right IJ approach Signed, Dulcy Fanny. Dellia Nims, RPVI [facility] Radiology Electronically Signed   By: Corrie Mckusick D.O.   On: 09/16/2020 12:45        Scheduled Meds: . (feeding supplement) PROSource Plus  30 mL Oral QID  . amoxicillin-clavulanate  1 tablet Oral BID  . Chlorhexidine Gluconate Cloth  6 each Topical Daily  . [START ON 09/17/2020] Chlorhexidine Gluconate Cloth  6 each Topical Q0600  . doxycycline  100 mg Oral Q12H  . heparin sodium (porcine)      . insulin aspart  0-15 Units Subcutaneous TID WC  . insulin aspart  0-5 Units Subcutaneous QHS  . lidocaine      . metoprolol tartrate  50 mg Oral BID  . multivitamin with minerals  1 tablet Oral Daily   Continuous  Infusions: . heparin 1,800 Units/hr (09/16/20 1552)     LOS: 8 days    Time spent:40 min    Donatello Kleve, Geraldo Docker, MD [facility] Pager [phone]  If 7PM-7AM, please contact night-coverage [email or web address] Password [email or web address] 09/16/2020, 8:44 PM

## 2020-09-16 NOTE — Progress Notes (Signed)
Pt off the unit at this time. IV heparin is paused until pt arrives back to his room, noted.

## 2020-09-16 NOTE — Progress Notes (Signed)
Pt heart rate increased and regular at times, will continue to monitor.

## 2020-09-16 NOTE — Progress Notes (Signed)
Heparin infusion was stopped by RN at 1149 prior to patient going to IR for HD catheter placement. Per discussion with RN, Levada Dy, heparin infusion was resumed at 1300. Pharmacy was not notified of this, and heparin level/aPTT were drawn at 1359. Will redraw both heparin level and aPTT at 2100 (8 hours after resumption) to accurately assess and adjust heparin infusion rate as needed.   Lindell Spar, PharmD, BCPS Clinical Pharmacist  09/16/2020 4:21 PM

## 2020-09-16 NOTE — Progress Notes (Signed)
09/16/2020 Verbal consent given by Dr. Dia Crawford and pt.  [name],   [facility]  Name:  [name].    MRN: [id number] DOB: 19-Apr-1951  09/16/2020  [name] was observed post Covid-19 immunization for 15 minutes without incident. He was provided with Vaccine Information Sheet and instruction to access the V-Safe system.   [name] was instructed to call 911 with any severe reactions post vaccine: Marland Kitchen Difficulty breathing  . Swelling of face and throat  . A fast heartbeat  . A bad rash all over body  . Dizziness and weakness   Immunizations Administered    Name Date Dose VIS Date Route   JANSSEN COVID-19 VACCINE 09/16/2020 12:15 PM 0.5 mL 09/03/2020 Intramuscular   Manufacturer: [employer]ph[employer]a [employer]   Lot: [id number]   [place]: [id number]     RN-BSN-CCM

## 2020-09-16 NOTE — Progress Notes (Signed)
[facility] Progress Note  Subjective: pt seen in room, no c/o's, no confusion or jerking.   Vitals:   09/15/20 1314 09/15/20 1512 09/15/20 2225 09/16/20 0618  BP: 109/84  (!) 101/59 92/68  Pulse: 75  63 (!) 59  Resp: 17  16 18  Temp: (!) 97.5 F (36.4 C)  97.8 F (36.6 C) 97.7 F (36.5 C)  TempSrc: Oral  Oral Oral  SpO2: 99%  97% 97%  Weight:  134.4 kg    Height:        Exam:   alert, nad   no jvd  Chest cta bilat  Cor irreg irreg no MG  Abd soft ntnd no ascites   Ext sig woody edema 2-3+ lower legs, +1-2 upper legs   Alert, NF, ox3    UA 10/31 - 100 prot, 0-5 rbc/ wbc, rare bact    UNa < 40, UCr 166.2    RUS 10/31 - 14.5/ 14.6 cm kidneys w/o hydro, normal echotexture, normal bladder appearance, limited study d/t body habitus   Summary: 69 yo hx diast CHF, DM2, afib , HTN found down in [place] during welfare check, currently f/b APS. Pt reported fall in [place] same day. Pt reported stopping all medications mos ago except torsemide, "didn't think I needed them anymore". In ED had afib /RVR and diabetic foot infection, started on IV vanc/ cefepime, po metoprolol started and pt admitted. MRI showed OM R foot. B/L creat is 1.3- 1.4, creat here 1.9 on admit, then rose up to 27 for a few days then 3.72 yesterday. Asked to see for A/C renal failure  Assessment/ Plan: 1. AKI on CKD 3b - BL creat 1.3-1.9, eGFR 35- 51 ml/min.  Creatinine had risen from 1.96 at admission to 2.75-2.78 range for a few days before increasing now to 3.7 yest and 4.7 today. He was treated with Lasix 40mg IV daily for a few days. Presentation BP was in the 112-120's range but since then has been in the 90-100's systolic range. Appears to have underlying CKD now an acute component as well which is likely related to the osteomyelitis + hemodynamics/ relative hypotension/ diuresis. UOP down 300 cc/day.  Renal US showed no signs of obstruction. UA unremarkable. UNa was lowish, UCr high. Vanc level was 10 (got 2  days IV vanc then dc'd). CXR 10/30 showed mild vasc congestion. Total I/O is + 5 L since admit.   - renal function continues to decline, recommend placement temp HD cath and transfer pt to [facility] for dialysis. Hopefully this will be a short-term situation.   2. CHF with diastolic and systolic component w/ decreased EF of 40-45% 01/2020 3. HTN - relatively hypotensive at this time. BP's 100's. Not getting any BP lowering meds now.  4. T2DM - [place] 5. Afib w/ RVR - heart rates on the higher side, 100's -110's 6. Sepsis w/ osteomyelitis of the 5th toe on rt foot - augmentin/ doxy po  7. Chronic LE edema - legs wrapped     Rob Schertz 09/16/2020, 12:13 PM   Recent Labs  Lab 09/15/20 0436 09/16/20 0456  K 4.0 4.3  BUN 88* 105*  CREATININE 4.79* 5.91*  CALCIUM 8.1* 7.9*  PHOS 7.0* 8.0*  HGB 11.4* 11.6*   Inpatient medications: . heparin lock flush      . heparin sodium (porcine)      . lidocaine      . (feeding supplement) PROSource Plus  30 mL Oral QID  . amoxicillin-clavulanate    1 tablet Oral BID  . doxycycline  100 mg Oral Q12H  . insulin aspart  0-15 Units Subcutaneous TID WC  . insulin aspart  0-5 Units Subcutaneous QHS  . metoprolol tartrate  50 mg Oral BID  . multivitamin with minerals  1 tablet Oral Daily   . heparin Stopped (09/16/20 1148)   acetaminophen **OR** acetaminophen, HYDROmorphone (DILAUDID) injection, labetalol, melatonin       

## 2020-09-17 ENCOUNTER — Other Ambulatory Visit (HOSPITAL_COMMUNITY): Payer: Medicare Other

## 2020-09-17 DIAGNOSIS — N179 Acute kidney failure, unspecified: Secondary | ICD-10-CM | POA: Diagnosis not present

## 2020-09-17 DIAGNOSIS — N1832 Chronic kidney disease, stage 3b: Secondary | ICD-10-CM | POA: Diagnosis not present

## 2020-09-17 DIAGNOSIS — I5043 Acute on chronic combined systolic (congestive) and diastolic (congestive) heart failure: Secondary | ICD-10-CM

## 2020-09-17 DIAGNOSIS — M869 Osteomyelitis, unspecified: Secondary | ICD-10-CM | POA: Diagnosis not present

## 2020-09-17 DIAGNOSIS — G9341 Metabolic encephalopathy: Secondary | ICD-10-CM | POA: Diagnosis not present

## 2020-09-17 LAB — CBC WITH DIFFERENTIAL/PLATELET
Abs Immature Granulocytes: 0.13 10*3/uL — ABNORMAL HIGH (ref 0.00–0.07)
Basophils Absolute: 0.1 10*3/uL (ref 0.0–0.1)
Basophils Relative: 1 %
Eosinophils Absolute: 0.2 10*3/uL (ref 0.0–0.5)
Eosinophils Relative: 1 %
HCT: 36 % — ABNORMAL LOW (ref 39.0–52.0)
Hemoglobin: 11 g/dL — ABNORMAL LOW (ref 13.0–17.0)
Immature Granulocytes: 1 %
Lymphocytes Relative: 15 %
Lymphs Abs: 1.6 10*3/uL (ref 0.7–4.0)
MCH: 26.7 pg (ref 26.0–34.0)
MCHC: 30.6 g/dL (ref 30.0–36.0)
MCV: 87.4 fL (ref 80.0–100.0)
Monocytes Absolute: 0.9 10*3/uL (ref 0.1–1.0)
Monocytes Relative: 9 %
Neutro Abs: 7.6 10*3/uL (ref 1.7–7.7)
Neutrophils Relative %: 73 %
Platelets: 350 10*3/uL (ref 150–400)
RBC: 4.12 MIL/uL — ABNORMAL LOW (ref 4.22–5.81)
RDW: 14.9 % (ref 11.5–15.5)
WBC: 10.4 10*3/uL (ref 4.0–10.5)
nRBC: 0 % (ref 0.0–0.2)

## 2020-09-17 LAB — BASIC METABOLIC PANEL
Anion gap: 15 (ref 5–15)
BUN: 118 mg/dL — ABNORMAL HIGH (ref 8–23)
CO2: 16 mmol/L — ABNORMAL LOW (ref 22–32)
Calcium: 8.1 mg/dL — ABNORMAL LOW (ref 8.9–10.3)
Chloride: 105 mmol/L (ref 98–111)
Creatinine, Ser: 6.79 mg/dL — ABNORMAL HIGH (ref 0.61–1.24)
GFR, Estimated: 8 mL/min — ABNORMAL LOW (ref >60)
Glucose, Bld: 169 mg/dL — ABNORMAL HIGH (ref 70–99)
Potassium: 4.3 mmol/L (ref 3.5–5.1)
Sodium: 136 mmol/L (ref 135–145)

## 2020-09-17 LAB — HEPARIN LEVEL (UNFRACTIONATED): Heparin Unfractionated: 0.56 IU/mL (ref 0.30–0.70)

## 2020-09-17 LAB — GLUCOSE, CAPILLARY
Glucose-Capillary: 141 mg/dL — ABNORMAL HIGH (ref 70–99)
Glucose-Capillary: 157 mg/dL — ABNORMAL HIGH (ref 70–99)
Glucose-Capillary: 124 mg/dL — ABNORMAL HIGH (ref 70–99)
Glucose-Capillary: 153 mg/dL — ABNORMAL HIGH (ref 70–99)

## 2020-09-17 LAB — HEPATITIS B SURFACE ANTIGEN: Hepatitis B Surface Ag: NONREACTIVE

## 2020-09-17 LAB — PHOSPHORUS: Phosphorus: 9 mg/dL — ABNORMAL HIGH (ref 2.5–4.6)

## 2020-09-17 LAB — APTT: aPTT: 124 seconds — ABNORMAL HIGH (ref 24–36)

## 2020-09-17 LAB — MAGNESIUM: Magnesium: 2.2 mg/dL (ref 1.7–2.4)

## 2020-09-17 LAB — HEPATITIS B CORE ANTIBODY, TOTAL: Hep B Core Total Ab: NONREACTIVE

## 2020-09-17 MED ORDER — HEPARIN SODIUM (PORCINE) 1000 UNIT/ML IJ SOLN
INTRAMUSCULAR | Status: AC
  Administered 2020-09-17: 1000 [IU]
  Filled 2020-09-17: qty 3

## 2020-09-17 MED ORDER — HEPARIN SODIUM (PORCINE) 1000 UNIT/ML DIALYSIS: 2000.0000 [IU] | INTRAMUSCULAR | Status: DC | PRN

## 2020-09-17 MED ORDER — MIDODRINE HCL 5 MG PO TABS
10.0000 mg | ORAL_TABLET | Freq: Three times a day (TID) | ORAL | Status: DC
  Administered 2020-09-17 – 2020-09-28 (×31): 10 mg via ORAL
  Filled 2020-09-17 (×32): qty 2

## 2020-09-17 MED ORDER — HEPARIN SODIUM (PORCINE) 1000 UNIT/ML DIALYSIS: 3000.0000 [IU] | INTRAMUSCULAR | Status: DC | PRN

## 2020-09-17 MED ORDER — AMOXICILLIN-POT CLAVULANATE 500-125 MG PO TABS
1.0000 | ORAL_TABLET | Freq: Every day | ORAL | Status: DC
  Administered 2020-09-17 – 2020-09-29 (×13): 500 mg via ORAL
  Filled 2020-09-17 (×13): qty 1

## 2020-09-17 NOTE — Plan of Care (Signed)
  Problem: Education: Goal: Knowledge of General Education information will improve Description Including pain rating scale, medication(s)/side effects and non-pharmacologic comfort measures Outcome: Progressing   

## 2020-09-17 NOTE — Consult Note (Signed)
[place] Nurse wound follow up Wound type: venous insufficiency and neuropathic ulcerations Full thickness wound to right anterior foot is 3X3X.8cm, 100% yellow/black slough, strong foul odor, mod amt brown drainage.  Right plantar heel with full thickness wound; 8X8X.5cm, 25% red, 75% slough/eschar, strong foul odor, mod amt brown drainage.  Right anterior calf with red moist partial thickness wound, 5X5X.2cm, mod amt red drainage, some odor  Wound bed:see above  Drainage (amount, consistency, odor) heavy, purulent, foul odor Periwound: venous stasis skin changes; hair and dirt caked on toes and some of the foot Dressing procedure/placement/frequency: Applied Aquacel to open wounds, applied 4 layer Profore bilaterally.  [place] nurse to change wound care and compression weekly on Wednesdays while inpatient per Dr. Sharol Given.  Patient to transfer to [facility] today for HD  [place], [place], [place]

## 2020-09-17 NOTE — Progress Notes (Signed)
[place] [address] went to [facility] for HD by [facility].

## 2020-09-17 NOTE — Progress Notes (Signed)
[place] for IV heparin Indication: atrial fibrillation   Allergies  Allergen Reactions  . Codeine Swelling    Water retention  . Sulfur Other (See Comments)    Not sure of reaction a young child    Patient Measurements: Height: 5\' 9"  (175.3 cm) Weight: 134.4 kg (296 lb 4.8 oz) IBW/kg (Calculated) : 70.7 Heparin Dosing Weight: 93 kg  Vital Signs: Temp: 97.9 F (36.6 C) (11/03 0502) BP: 101/65 (11/03 0502) Pulse Rate: 81 (11/03 0502)  Labs: Recent Labs    09/14/20 1222 09/15/20 0436 09/15/20 0436 09/16/20 0456 09/16/20 0456 09/16/20 1359 09/16/20 2043 09/17/20 0600  HGB  --  11.4*   < > 11.6*  --   --   --  11.0*  HCT  --  37.9*  --  38.3*  --   --   --  36.0*  PLT  --  326  --  320  --   --   --  350  APTT  --   --   --  50*   < > 74* 96* 124*  HEPARINUNFRC  --   --   --  0.74*   < > 0.51 0.45 0.56  CREATININE  --  4.79*  --  5.91*  --   --   --  6.79*  CKTOTAL 41*  --   --   --   --   --   --   --    < > = values in this interval not displayed.    Estimated Creatinine Clearance: 13.6 mL/min (A) (by C-G formula based on SCr of 6.79 mg/dL (H)).   Medical History: Past Medical History:  Diagnosis Date  . Arthritis   . Back pain    occasionally  . BPH (benign prostatic hypertrophy)    takes Uroxatral daily as well as Finasteride   . Diabetes mellitus without complication ([place])    takes Metformin daily  . Diabetic foot infection ([place]) 10/22/2019  . GERD (gastroesophageal reflux disease)    no meds  . Hepatitis hx of   at age 33  . Hypertension    takes Losartan and HCTZ daily  . Joint pain   . Lymphedema 10/22/2019  . Urinary frequency   . Urinary urgency   . Venous stasis dermatitis 11/19/2019    Assessment: 69 y/o M with a h/o atrial fibrillation supposed to be on Eliquis admitted for OM. Patient was transitioned from IV UFH to Eliquis with no upcoming surgery. Pharmacy consulted to place patient back on heparin  bridge due to possible need for hemodialysis catheter.   Today, 09/17/20:  Placement of HD catheter done in IR 11/2  heparin level = 0.56  CBC: Hgb 11.0, stable. Pltc WNL.   No bleeding or infusion issues noted per nursing   Goal of Therapy:  Heparin level 0.3-0.7 units/ml  Monitor platelets by anticoagulation protocol: Yes   Plan:   Decrease heparin infusion slightly to 1700 units/hr  Daily HL/CBC  Monitor closely for s/sx of bleeding    Ulice Dash, PharmD, [place] [phone] 09/17/2020,8:34 AM

## 2020-09-17 NOTE — Progress Notes (Signed)
[facility] Progress Note  Subjective: pt seen in room, no c/o's, no confusion or jerking, no N/V. Going for HD this afternoon. B/ Cr cont to rise  Vitals:   09/16/20 1351 09/16/20 2016 09/17/20 0502 09/17/20 0845  BP: 117/81 118/73 101/65 96/74  Pulse: (!) 101 (!) 101 81 (!) 108  Resp: $Remo'16 18 18   'pleha$ Temp: 98.1 F (36.7 C) 97.6 F (36.4 C) 97.9 F (36.6 C)   TempSrc: Oral Oral    SpO2: 96% 97% 97%   Weight:      Height:        Exam:   alert, nad   no jvd  Chest cta bilat  Cor irreg irreg no MG  Abd soft ntnd no ascites   Ext sig woody edema 2-3+ lower legs, +1-2 upper legs   Alert, NF, ox3    UA 10/31 - 100 prot, 0-5 rbc/ wbc, rare bact    UNa < 40, UCr 166.2    RUS 10/31 - 14.5/ 14.6 cm kidneys w/o hydro, normal echotexture, normal bladder appearance, limited study d/t body habitus   Summary: 69 yo hx diast CHF, DM2, afib , HTN found down in [place] during welfare check, currently f/b APS. Pt reported fall in [place] same day. Pt reported stopping all medications mos ago except torsemide, "didn't think I needed them anymore". In ED had afib /RVR and diabetic foot infection, started on IV vanc/ cefepime, po metoprolol started and pt admitted. MRI showed OM R foot. B/L creat is 1.3- 1.4, creat here 1.9 on admit, then rose up to 27 for a few days then 3.72 yesterday. Asked to see for A/C renal failure  Assessment/ Plan: 1. AKI on CKD 3b - BL creat 1.3-1.9, eGFR 35- 51 ml/min.  Creatinine had risen from 1.96 at admission to 2.75-2.78 range for a few days before increasing now up to 6.7 w/ BN 118 . He was treated with Lasix $RemoveBe'40mg'svVjytEwo$  IV daily for a few days. Presentation BP was in the 110-120's range but since then has been in the 86-578'I systolic range.  AKI likely related to the osteomyelitis + hemodynamics/ relative hypotension/ diuresis. UOP low the last 3 days.  Renal US showed no signs of obstruction. UA unremarkable. UNa was lowish, UCr high. Vanc level was 10 (got 2 days IV vanc  then dc'd). CXR 10/30 showed mild vasc congestion. Total I/O is + 5 L since admit.   - renal function continues to decline, is s/p temp HD cath 11/2 and will get 1st HD today and orders written for tomorrow too. Hopefully this will be a short-term situation. Pt does have social issues which might impact his ability to do long-term HD.   2. CHF with diastolic and systolic component w/ decreased EF of 40-45% 01/2020- w/ vol overload, up 10kg, very swollen legs. I ordered echo , results pending, ? Cardiorenal component w/ marked LE edema. UF as tol w/ HD today / tomorrow, midodrine added.  3. HTN - relatively hypotensive at this time. BP's 100's. On metoprolol xl 50 for afib. Adding midodrine 10 tid today 4. T2DM - [place] 5. Afib w/ RVR - heart rates on the higher side, 100's -110's, on metop 50 xl qd 6. Sepsis w/ osteomyelitis of the 5th toe on rt foot - augmentin/ doxy po      Rob Makiyla Linch 09/17/2020, 12:48 PM   Recent Labs  Lab 09/16/20 0456 09/17/20 0600  K 4.3 4.3  BUN 105* 118*  CREATININE 5.91* 6.79*  CALCIUM 7.9* 8.1*  PHOS 8.0* 9.0*  HGB 11.6* 11.0*   Inpatient medications: . (feeding supplement) PROSource Plus  30 mL Oral QID  . amoxicillin-clavulanate  1 tablet Oral Daily  . Chlorhexidine Gluconate Cloth  6 each Topical Daily  . Chlorhexidine Gluconate Cloth  6 each Topical Q0600  . doxycycline  100 mg Oral Q12H  . insulin aspart  0-15 Units Subcutaneous TID WC  . insulin aspart  0-5 Units Subcutaneous QHS  . metoprolol tartrate  50 mg Oral BID  . multivitamin with minerals  1 tablet Oral Daily   . heparin 1,750 Units/hr (09/17/20 0859)   acetaminophen **OR** acetaminophen, HYDROmorphone (DILAUDID) injection, labetalol, lidocaine, melatonin, sodium chloride flush

## 2020-09-17 NOTE — Progress Notes (Signed)
Nutrition Follow-up  DOCUMENTATION CODES:   Obesity unspecified  INTERVENTION:  - continue Prosource Plus QID.  - will d/c multivitamin. - will monitor for education needs due to new HD.  NUTRITION DIAGNOSIS:   Increased nutrient needs related to wound healing, acute illness as evidenced by estimated needs. -ongoing  GOAL:   Patient will meet greater than or equal to 90% of their needs -met  MONITOR:   PO intake, Supplement acceptance, Labs, Weight trends, I & O's  ASSESSMENT:   69 y.o. male with medical history significant of type 2 DM, CHF, afib on eliquis, and HTN. He presented to the ED after being found down in the bathroom during a welfare check. Patient was noted to have unsanitary living condition and is currently followed by APS. He reported falling upon standing without LOC. He fell on his R chest which resulted in R lower chest pain. He reported to ED staff that he stopped taking all of his medications several months ago (except furosemide) because "I didn't think I needed them anymore."  He has been eating 100% of all meals since 10/31. Yesterday, 100% of all meals provided a total of 1601 kcal, 79 grams protein.   He has been accepting Prosource Plus 100% of the time offered.   Patient weighed 275 lb on date of admission (10/25); this may have been a stated weight. He was re-weighed on 11/1 at which time he weighed 296 lb. Deep pitting edema to BLE documented in the flow sheet.  Patient transferred to [facility] today for HD start and is currently in HD. Plan for outpatient follow-up with Orthopedic surgery concerning R 5th toe.    Labs reviewed; CBGs: 141 and 157 mg/dl, BUN: 118 mg/dl, creatinine: 6.79 mg/dl, Ca: 8.1 mg/dl, Phos: 9 mg/dl, GFR:8 ml/min.  Medications reviewed; sliding scale novolog.    NUTRITION - FOCUSED PHYSICAL EXAM:  unable to complete at this time.  Diet Order:   Diet Order            Diet Carb Modified Fluid consistency: Thin; Room  service appropriate? Yes  Diet effective now                 EDUCATION NEEDS:   Not appropriate for education at this time  Skin:  Skin Assessment: Skin Integrity Issues: Skin Integrity Issues:: Unstageable, Other (Comment) Unstageable: full thickness to R anterior foot; Right plantar heel Other: partial thickness wound to R anterior calf  Last BM:  11/3 (type 6)  Height:   Ht Readings from Last 1 Encounters:  09/08/20 $RemoveB'5\' 9"'EVFwVwnR$  (1.753 m)    Weight:   Wt Readings from Last 1 Encounters:  09/15/20 134.4 kg    Estimated Nutritional Needs:  Kcal:  2150-2350 kcal Protein:  115-125 grams Fluid:  per Nephrology given new HD start and deep pitting edema      Jarome Matin, MS, RD, LDN, CNSC Inpatient Clinical Dietitian RD pager # available in [place]  After hours/weekend pager # available in [facility]

## 2020-09-17 NOTE — Progress Notes (Addendum)
Transfer from [facility]. Pt axox4, denies any pain at the moment. VSS, pt on RA and tele monitor per order. Bilateral legs wounds have been dressed by the [facility] RN. Call light within reach, bed in lowest position. Heparin gtt @ 17.5 ml/hr LFA. Pt oriented to room and staff.

## 2020-09-17 NOTE — Progress Notes (Signed)
PT Cancellation Note  Patient Details Name: [name]. MRN: [id number] DOB: 1951-04-17   Cancelled Treatment:     pt soon going to [facility] for HD.  Pt has been evaluated with rec for SNF.   Rica Koyanagi  PTA Acute  Rehabilitation Services Pager      [phone] Office      [phone]

## 2020-09-17 NOTE — Progress Notes (Signed)
Patient ID: Keith Reading., male   DOB: January 24, 1951, 69 y.o.   MRN: [id number]  PROGRESS NOTE    Keith Reading.  YIR:[id number] DOB: 09-Dec-1950 DOA: 09/08/2020 PCP: Aura Dials, MD   Brief Narrative:  69 year old male with history of diabetes mellitus type 2, chronic systolic and diastolic CHF, paroxysmal A. fib (supposed to be on Eliquis), hypertension presented to the ED after being found down in the bathroom during welfare check.  Per report, patient noted to have an unsanitary living condition and is currently followed by adult protective services.Patient reports falling while standing up from bathroom without LOC, resulting in fall onto R chest and resultant R lower chest pain. Patient was found down and brought into ED.   Of note, patient reports stopping all medications several months ago with exception of torsemide because "I didn't think I needed them anymore." Pt has only been taking torsemide PRN swelling.  He was found to have necrotic foot infections and underwent xray of right foot, which revealed subcortical lucency involving 5th digit concerning for OM.  He was started on vancomycin and cefepime and orthopedic surgery was consulted. ABIs were also ordered.  Test was inconclusive due to severe lower extremity edema.   Initially patient was also against surgical treatment options despite severity of his wounds, their obvious chronicity, and inability to explain consequences for avoiding surgery. We requested psychiatry to evaluate patient for decision making ability in this setting. After repeat explanation of his diagnosis and possible treatment options he was able to voice adequate understanding to psychiatry and was considered to have decision making capacity regarding his current infection.   He will need to continue with serial compression wrappings at discharge in efforts to promote better chance of wound healing and proper assessment of ABIs to help decide best  treatment course.   Given his significant deconditioning and after discussion with his daughter, there is concern for his ability to safely return home due to poor living situation; Physical therapy was consulted. After PT evaluation he was recommended for SNF at discharge.  He was accepting of this and placement was pursued.  Overall, he had good clinical response to IV antibiotics.  He was deescalated to Augmentin and doxycycline to finish out a course. He will continue with serial compression wrappings at discharge to rehab as well as following up with Ortho for further management.  During hospitalization he was started on IV Lasix for diuresis given his severe lower extremity edema.  His creatinine continued to slowly uptrend.  Urine output measuring was inaccurate and difficult to gauge response. Nephrology was consulted on 09/14/2020 given his uptrending creatinine in setting of multiple antibiotics initially and some transient hypotension.  Renal ultrasound showed no hydronephrosis.  He underwent temporary HD catheter placement by IR on 09/16/2020 and nephrology recommended transfer to [facility] for possible short-term hemodialysis.   Assessment & Plan:   Acute kidney injury on chronic renal disease stage IIIb -Baseline creatinine 1.2-1.3 -Renal ultrasound was negative for hydronephrosis.  Creatinine 6.79 today.  Nephrology following and recommend initiation of hemodialysis.  Temporary HD catheter was placed by IR on 09/16/2020.  Patient is supposed to be transferred to [facility] for initiation of hemodialysis. -Continue strict input and output and daily weights.   -Monitor creatinine.  Avoid nephrotoxic medications as much as possible  Osteomyelitis of fifth toe of right foot -Probable osteomyelitis per x-ray; ABIs are indeterminate due to lower extremity edema -Patient advanced inpatient surgery at  this time -Patient needs serial compression wrappings to lower  extremity to aid with improving edema -Orthopedics had evaluated the patient during this hospitalization and patient will need outpatient follow-up with orthopedic surgery. -Initially on IV antibiotics which have been switched to oral Augmentin and doxycycline  Severe sepsis: Present on admission -Resolved.  Vitals stable currently  Acute metabolic encephalopathy -Secondary to uremia.  Mental status currently stable.  Monitor mental status.  Paroxysmal A. fib with RVR -Patient noncompliant to medications including beta-blocker at home.  Currently still intermittently tachycardic.  Continue metoprolol.  Eliquis on hold.  Continue heparin drip.  Acute on chronic combined systolic and diastolic heart failure -Most recent echo from 01/2020 showed EF of 40 to 45% -Lasix on hold due to worsening renal function.  Volume to be managed by initiation of hemodialysis.  Nephrology following.  Strict input output.  Daily weights.  Essential hypertension -Monitor blood pressure.  Continue metoprolol.  Diabetes mellitus type 2 uncontrolled with hyperglycemia -A1c 9.6 on 09/08/2020 -Continue CBGs with SSI  Morbid obesity -Outpatient follow-up   DVT prophylaxis: Heparin drip Code Status: Full Family Communication: None at bedside Disposition Plan: Status is: Inpatient  Remains inpatient appropriate because:Inpatient level of care appropriate due to severity of illness   Dispo: The patient is from: Home              Anticipated d/c is to: SNF              Anticipated d/c date is: > 3 days              Patient currently is not medically stable to d/c.   Consultants: Nephrology/psychiatry  Procedures: Right IJ nontunneled HD catheter placed by IR on 09/16/2020  Antimicrobials:  Anti-infectives (From admission, onward)   Start     Dose/Rate Route Frequency Ordered Stop   09/17/20 1000  amoxicillin-clavulanate (AUGMENTIN) 500-125 MG per tablet 500 mg        1 tablet Oral Daily 09/17/20  0839     09/14/20 2200  amoxicillin-clavulanate (AUGMENTIN) 500-125 MG per tablet 500 mg  Status:  Discontinued        1 tablet Oral 2 times daily 09/14/20 1524 09/17/20 0839   09/12/20 1000  amoxicillin-clavulanate (AUGMENTIN) 875-125 MG per tablet 1 tablet  Status:  Discontinued        1 tablet Oral Every 12 hours 09/12/20 0823 09/14/20 1524   09/12/20 1000  doxycycline (VIBRA-TABS) tablet 100 mg        100 mg Oral Every 12 hours 09/12/20 0823     09/10/20 2000  DAPTOmycin (CUBICIN) 740 mg in sodium chloride 0.9 % IVPB  Status:  Discontinued        740 mg 229.6 mL/hr over 30 Minutes Intravenous Daily 09/10/20 1230 09/12/20 0822   09/10/20 1200  metroNIDAZOLE (FLAGYL) IVPB 500 mg  Status:  Discontinued        500 mg 100 mL/hr over 60 Minutes Intravenous Every 8 hours 09/10/20 1050 09/12/20 0822   09/10/20 1132  vancomycin variable dose per unstable renal function (pharmacist dosing)  Status:  Discontinued         Does not apply See admin instructions 09/10/20 1132 09/10/20 1230   09/09/20 1700  vancomycin (VANCOREADY) IVPB 1500 mg/300 mL  Status:  Discontinued        1,500 mg 150 mL/hr over 120 Minutes Intravenous Every 24 hours 09/08/20 1956 09/10/20 1132   09/09/20 1000  ceFEPIme (MAXIPIME) 2 g in sodium chloride  0.9 % 100 mL IVPB  Status:  Discontinued        2 g 200 mL/hr over 30 Minutes Intravenous Every 12 hours 09/08/20 1947 09/12/20 0822   09/08/20 2200  ceFEPIme (MAXIPIME) 2 g in sodium chloride 0.9 % 100 mL IVPB        2 g 200 mL/hr over 30 Minutes Intravenous  Once 09/08/20 1754 09/08/20 2206   09/08/20 1800  vancomycin (VANCOCIN) IVPB 1000 mg/200 mL premix  Status:  Discontinued        1,000 mg 200 mL/hr over 60 Minutes Intravenous  Once 09/08/20 1754 09/08/20 1756   09/08/20 1630  vancomycin (VANCOREADY) IVPB 2000 mg/400 mL        2,000 mg 200 mL/hr over 120 Minutes Intravenous  Once 09/08/20 1551 09/08/20 1842   09/08/20 1600  piperacillin-tazobactam (ZOSYN) IVPB 3.375 g         3.375 g 100 mL/hr over 30 Minutes Intravenous  Once 09/08/20 1549 09/08/20 1645       Subjective: Patient seen and examined at bedside, poor historian.  Denies worsening abdominal pain, diarrhea, fever or vomiting.  Objective: Vitals:   09/16/20 1351 09/16/20 2016 09/17/20 0502 09/17/20 0845  BP: 117/81 118/73 101/65 96/74  Pulse: (!) 101 (!) 101 81 (!) 108  Resp: 16 18 18    Temp: 98.1 F (36.7 C) 97.6 F (36.4 C) 97.9 F (36.6 C)   TempSrc: Oral Oral    SpO2: 96% 97% 97%   Weight:      Height:        Intake/Output Summary (Last 24 hours) at 09/17/2020 1029 Last data filed at 09/17/2020 0400 Gross per 24 hour  Intake 466.13 ml  Output --  Net 466.13 ml   Filed Weights   09/08/20 1438 09/15/20 1512  Weight: 124.7 kg 134.4 kg    Examination:  General exam: Appears calm and comfortable.  Poor historian Respiratory system: Bilateral decreased breath sounds at bases with some scattered crackles Cardiovascular system: S1 & S2 heard, intermittently tachycardic Gastrointestinal system: Abdomen is morbidly obese, nondistended, soft and nontender. Normal bowel sounds heard. Extremities: Bilateral lower extremities are wrapped. Central nervous system: Awake, poor historian. No focal neurological deficits. Moving extremities Lymph: No cervical lymphadenopathy psychiatry: Flat affect    Data Reviewed: I have personally reviewed following labs and imaging studies  CBC: Recent Labs  Lab 09/13/20 0526 09/14/20 0607 09/15/20 0436 09/16/20 0456 09/17/20 0600  WBC 10.8* 10.8* 11.8* 10.5 10.4  NEUTROABS 8.0* 8.0* 8.5* 7.4 7.6  HGB 11.2* 10.9* 11.4* 11.6* 11.0*  HCT 37.2* 35.2* 37.9* 38.3* 36.0*  MCV 88.8 87.3 90.2 88.7 87.4  PLT 286 295 326 320 449   Basic Metabolic Panel: Recent Labs  Lab 09/13/20 0526 09/14/20 0607 09/14/20 1222 09/15/20 0436 09/16/20 0456 09/17/20 0600  NA 136 133*  --  136 135 136  K 3.5 3.4*  --  4.0 4.3 4.3  CL 104 102  --  105 106  105  CO2 17* 17*  --  16* 16* 16*  GLUCOSE 143* 233*  --  187* 168* 169*  BUN 80* 87*  --  88* 105* 118*  CREATININE 2.99* 3.72*  --  4.79* 5.91* 6.79*  CALCIUM 7.9* 7.9*  --  8.1* 7.9* 8.1*  MG 1.9 1.9  --  1.9 2.0 2.2  PHOS  --   --  6.3* 7.0* 8.0* 9.0*   GFR: Estimated Creatinine Clearance: 13.6 mL/min (A) (by C-G formula based on SCr of  6.79 mg/dL (H)). Liver Function Tests: No results for input(s): AST, ALT, ALKPHOS, BILITOT, PROT, ALBUMIN in the last 168 hours. No results for input(s): LIPASE, AMYLASE in the last 168 hours. No results for input(s): AMMONIA in the last 168 hours. Coagulation Profile: No results for input(s): INR, PROTIME in the last 168 hours. Cardiac Enzymes: Recent Labs  Lab 09/14/20 1222  CKTOTAL 41*   BNP (last 3 results) No results for input(s): PROBNP in the last 8760 hours. HbA1C: No results for input(s): HGBA1C in the last 72 hours. CBG: Recent Labs  Lab 09/16/20 0732 09/16/20 1119 09/16/20 1611 09/16/20 2020 09/17/20 0737  GLUCAP 148* 173* 164* 165* 141*   Lipid Profile: No results for input(s): CHOL, HDL, LDLCALC, TRIG, CHOLHDL, LDLDIRECT in the last 72 hours. Thyroid Function Tests: No results for input(s): TSH, T4TOTAL, FREET4, T3FREE, THYROIDAB in the last 72 hours. Anemia Panel: No results for input(s): VITAMINB12, FOLATE, FERRITIN, TIBC, IRON, RETICCTPCT in the last 72 hours. Sepsis Labs: No results for input(s): PROCALCITON, LATICACIDVEN in the last 168 hours.  Recent Results (from the past 240 hour(s))  Respiratory Panel by RT PCR (Flu A&B, Covid) - Nasopharyngeal Swab     Status: None   Collection Time: 09/08/20  5:13 PM   Specimen: Nasopharyngeal Swab  Result Value Ref Range Status   SARS Coronavirus 2 by RT PCR NEGATIVE NEGATIVE Final    Comment: (NOTE) SARS-CoV-2 target nucleic acids are NOT DETECTED.  The SARS-CoV-2 RNA is generally detectable in upper respiratoy specimens during the acute phase of infection. The  lowest concentration of SARS-CoV-2 viral copies this assay can detect is 131 copies/mL. A negative result does not preclude SARS-Cov-2 infection and should not be used as the sole basis for treatment or other patient management decisions. A negative result may occur with  improper specimen collection/handling, submission of specimen other than nasopharyngeal swab, presence of viral mutation(s) within the areas targeted by this assay, and inadequate number of viral copies (<131 copies/mL). A negative result must be combined with clinical observations, patient history, and epidemiological information. The expected result is Negative.  Fact Sheet for Patients:  PinkCheek.be  Fact Sheet for Healthcare Providers:  GravelBags.it  This test is no t yet approved or cleared by the [place] FDA and  has been authorized for detection and/or diagnosis of SARS-CoV-2 by FDA under an Emergency Use Authorization (EUA). This EUA will remain  in effect (meaning this test can be used) for the duration of the COVID-19 declaration under Section 564(b)(1) of the Act, 21 U.S.C. section 360bbb-3(b)(1), unless the authorization is terminated or revoked sooner.     Influenza A by PCR NEGATIVE NEGATIVE Final   Influenza B by PCR NEGATIVE NEGATIVE Final    Comment: (NOTE) The Xpert Xpress SARS-CoV-2/FLU/RSV assay is intended as an aid in  the diagnosis of influenza from Nasopharyngeal swab specimens and  should not be used as a sole basis for treatment. Nasal washings and  aspirates are unacceptable for Xpert Xpress SARS-CoV-2/FLU/RSV  testing.  Fact Sheet for Patients: PinkCheek.be  Fact Sheet for Healthcare Providers: GravelBags.it  This test is not yet approved or cleared by the [place] FDA and  has been authorized for detection and/or diagnosis of SARS-CoV-2 by  FDA under  an Emergency Use Authorization (EUA). This EUA will remain  in effect (meaning this test can be used) for the duration of the  Covid-19 declaration under Section 564(b)(1) of the Act, 21  U.S.C. section 360bbb-3(b)(1), unless the authorization  is  terminated or revoked. Performed at [facility], [place] [address], [place], [place] [address]   Blood Cultures x 2 sites     Status: None   Collection Time: 09/08/20  7:04 PM   Specimen: BLOOD  Result Value Ref Range Status   Specimen Description   Final    BLOOD LEFT WRIST Performed at [facility] [address] [place], [place] [address]    Special Requests   Final    BOTTLES DRAWN AEROBIC AND ANAEROBIC Blood Culture adequate volume Performed at [facility] [address] [place], [place] [address]    Culture   Final    NO GROWTH 5 DAYS Performed at [facility], [place] [address] [place], [place] [address]    Report Status 09/13/2020 FINAL  Final  Culture, blood (x 2)     Status: None   Collection Time: 09/08/20  9:03 PM   Specimen: BLOOD LEFT HAND  Result Value Ref Range Status   Specimen Description   Final    BLOOD LEFT HAND Performed at [facility] [address] [place], [place] [address]    Special Requests   Final    BOTTLES DRAWN AEROBIC AND ANAEROBIC Blood Culture adequate volume Performed at [facility] [address] [place], [place] [address]    Culture   Final    NO GROWTH 5 DAYS Performed at [facility], [place] [address], [place], [place] [address]    Report Status 09/13/2020 FINAL  Final         Radiology Studies: IR Fluoro Guide CV Line Right  Result Date: 09/16/2020 INDICATION: 69 year old male with a history of renal failure EXAM: IMAGE GUIDED PLACEMENT OF NON TUNNELED HEMODIALYSIS CATHETER MEDICATIONS: None ANESTHESIA/SEDATION: None FLUOROSCOPY TIME:  Fluoroscopy Time: 1 minutes 42  seconds (52 mGy). COMPLICATIONS: None PROCEDURE: Informed written consent was obtained from the patient and the patient's family after a thorough discussion of the procedural risks, benefits and alternatives. All questions were addressed. A timeout was performed prior to the initiation of the procedure. The right neck and chest was prepped with chlorhexidine, and draped in the usual sterile fashion using maximum barrier technique (cap and mask, sterile gown, sterile gloves, large sterile sheet, hand hygiene and cutaneous antiseptic). Local anesthesia was attained by infiltration with 1% lidocaine without epinephrine. Ultrasound demonstrated patency of the right internal jugular vein, and this was documented with an image. Under real-time ultrasound guidance, this vein was accessed with a 21 gauge micropuncture needle and image documentation was performed. A small dermatotomy was made at the access site with an 11 scalpel. A 0.018" wire was advanced into the SVC and the access needle exchanged for a 35F micropuncture vascular sheath. The 0.018" wire was then removed and a 0.035" wire advanced into the IVC. A 20 cm catheter was selected. Skin and subcutaneous tissues were serially dilated. Catheter was placed on the wire. The catheter tip is positioned in the upper right atrium. This was documented with a spot image. Both ports of the hemodialysis catheter were then tested for excellent function. The ports were then locked with heparinized lock. Patient tolerated the procedure well and remained hemodynamically stable throughout. No complications were encountered and no significant blood loss was encountered. IMPRESSION: Status post image guided placement temporary hemodialysis catheter via right IJ approach Signed, Dulcy Fanny. Dellia Nims, RPVI [facility] Radiology Electronically Signed   By:  Corrie Mckusick D.O.   On: 09/16/2020 12:45   IR US Guide Vasc Access  Right  Result Date: 09/16/2020 INDICATION: 69 year old male with a history of renal failure EXAM: IMAGE GUIDED PLACEMENT OF NON TUNNELED HEMODIALYSIS CATHETER MEDICATIONS: None ANESTHESIA/SEDATION: None FLUOROSCOPY TIME:  Fluoroscopy Time: 1 minutes 42 seconds (52 mGy). COMPLICATIONS: None PROCEDURE: Informed written consent was obtained from the patient and the patient's family after a thorough discussion of the procedural risks, benefits and alternatives. All questions were addressed. A timeout was performed prior to the initiation of the procedure. The right neck and chest was prepped with chlorhexidine, and draped in the usual sterile fashion using maximum barrier technique (cap and mask, sterile gown, sterile gloves, large sterile sheet, hand hygiene and cutaneous antiseptic). Local anesthesia was attained by infiltration with 1% lidocaine without epinephrine. Ultrasound demonstrated patency of the right internal jugular vein, and this was documented with an image. Under real-time ultrasound guidance, this vein was accessed with a 21 gauge micropuncture needle and image documentation was performed. A small dermatotomy was made at the access site with an 11 scalpel. A 0.018" wire was advanced into the SVC and the access needle exchanged for a 61F micropuncture vascular sheath. The 0.018" wire was then removed and a 0.035" wire advanced into the IVC. A 20 cm catheter was selected. Skin and subcutaneous tissues were serially dilated. Catheter was placed on the wire. The catheter tip is positioned in the upper right atrium. This was documented with a spot image. Both ports of the hemodialysis catheter were then tested for excellent function. The ports were then locked with heparinized lock. Patient tolerated the procedure well and remained hemodynamically stable throughout. No complications were encountered and no significant blood loss was encountered. IMPRESSION: Status post image guided placement temporary  hemodialysis catheter via right IJ approach Signed, Dulcy Fanny. Dellia Nims, RPVI [facility] Radiology Electronically Signed   By: Corrie Mckusick D.O.   On: 09/16/2020 12:45        Scheduled Meds: . (feeding supplement) PROSource Plus  30 mL Oral QID  . amoxicillin-clavulanate  1 tablet Oral Daily  . Chlorhexidine Gluconate Cloth  6 each Topical Daily  . Chlorhexidine Gluconate Cloth  6 each Topical Q0600  . doxycycline  100 mg Oral Q12H  . insulin aspart  0-15 Units Subcutaneous TID WC  . insulin aspart  0-5 Units Subcutaneous QHS  . metoprolol tartrate  50 mg Oral BID  . multivitamin with minerals  1 tablet Oral Daily   Continuous Infusions: . heparin 1,750 Units/hr (09/17/20 0859)          Aline August, MD [facility] 09/17/2020, 10:29 AM

## 2020-09-18 ENCOUNTER — Inpatient Hospital Stay (HOSPITAL_COMMUNITY): Payer: Medicare Other

## 2020-09-18 DIAGNOSIS — I5043 Acute on chronic combined systolic (congestive) and diastolic (congestive) heart failure: Secondary | ICD-10-CM | POA: Diagnosis not present

## 2020-09-18 DIAGNOSIS — M869 Osteomyelitis, unspecified: Secondary | ICD-10-CM | POA: Diagnosis not present

## 2020-09-18 LAB — CBC WITH DIFFERENTIAL/PLATELET
Abs Immature Granulocytes: 0.15 10*3/uL — ABNORMAL HIGH (ref 0.00–0.07)
Basophils Absolute: 0.1 10*3/uL (ref 0.0–0.1)
Basophils Relative: 1 %
Eosinophils Absolute: 0.2 10*3/uL (ref 0.0–0.5)
Eosinophils Relative: 1 %
HCT: 34.1 % — ABNORMAL LOW (ref 39.0–52.0)
Hemoglobin: 10.7 g/dL — ABNORMAL LOW (ref 13.0–17.0)
Immature Granulocytes: 1 %
Lymphocytes Relative: 12 %
Lymphs Abs: 1.6 10*3/uL (ref 0.7–4.0)
MCH: 26.4 pg (ref 26.0–34.0)
MCHC: 31.4 g/dL (ref 30.0–36.0)
MCV: 84 fL (ref 80.0–100.0)
Monocytes Absolute: 1.1 10*3/uL — ABNORMAL HIGH (ref 0.1–1.0)
Monocytes Relative: 9 %
Neutro Abs: 9.4 10*3/uL — ABNORMAL HIGH (ref 1.7–7.7)
Neutrophils Relative %: 76 %
Platelets: 343 10*3/uL (ref 150–400)
RBC: 4.06 MIL/uL — ABNORMAL LOW (ref 4.22–5.81)
RDW: 14.7 % (ref 11.5–15.5)
WBC: 12.5 10*3/uL — ABNORMAL HIGH (ref 4.0–10.5)
nRBC: 0 % (ref 0.0–0.2)

## 2020-09-18 LAB — BASIC METABOLIC PANEL
Anion gap: 14 (ref 5–15)
BUN: 85 mg/dL — ABNORMAL HIGH (ref 8–23)
CO2: 18 mmol/L — ABNORMAL LOW (ref 22–32)
Calcium: 8 mg/dL — ABNORMAL LOW (ref 8.9–10.3)
Chloride: 104 mmol/L (ref 98–111)
Creatinine, Ser: 6.09 mg/dL — ABNORMAL HIGH (ref 0.61–1.24)
GFR, Estimated: 9 mL/min — ABNORMAL LOW (ref >60)
Glucose, Bld: 159 mg/dL — ABNORMAL HIGH (ref 70–99)
Potassium: 3.7 mmol/L (ref 3.5–5.1)
Sodium: 136 mmol/L (ref 135–145)

## 2020-09-18 LAB — ECHOCARDIOGRAM COMPLETE
Area-P 1/2: 4.97 cm2
Calc EF: 50.4 %
Height: 69 in
S' Lateral: 4.3 cm
Single Plane A2C EF: 47 %
Single Plane A4C EF: 49.8 %
Weight: 4673.75 oz

## 2020-09-18 LAB — GLUCOSE, CAPILLARY
Glucose-Capillary: 147 mg/dL — ABNORMAL HIGH (ref 70–99)
Glucose-Capillary: 140 mg/dL — ABNORMAL HIGH (ref 70–99)
Glucose-Capillary: 111 mg/dL — ABNORMAL HIGH (ref 70–99)
Glucose-Capillary: 139 mg/dL — ABNORMAL HIGH (ref 70–99)

## 2020-09-18 LAB — HEMOGLOBIN AND HEMATOCRIT, BLOOD
HCT: 34.8 % — ABNORMAL LOW (ref 39.0–52.0)
Hemoglobin: 11 g/dL — ABNORMAL LOW (ref 13.0–17.0)

## 2020-09-18 LAB — PHOSPHORUS: Phosphorus: 7 mg/dL — ABNORMAL HIGH (ref 2.5–4.6)

## 2020-09-18 LAB — MAGNESIUM: Magnesium: 1.7 mg/dL (ref 1.7–2.4)

## 2020-09-18 LAB — HEPARIN LEVEL (UNFRACTIONATED): Heparin Unfractionated: 0.51 IU/mL (ref 0.30–0.70)

## 2020-09-18 LAB — HEPATITIS B SURFACE ANTIBODY, QUANTITATIVE: Hep B S AB Quant (Post): <3.1 m[IU]/mL — ABNORMAL LOW (ref >9.9)

## 2020-09-18 MED ORDER — HEPARIN SODIUM (PORCINE) 1000 UNIT/ML IJ SOLN
INTRAMUSCULAR | Status: AC
  Administered 2020-09-18: 1000 [IU]
  Filled 2020-09-18: qty 1

## 2020-09-18 MED ORDER — HEPARIN SODIUM (PORCINE) 1000 UNIT/ML IJ SOLN
INTRAMUSCULAR | Status: AC
  Filled 2020-09-18: qty 3

## 2020-09-18 MED ORDER — PERFLUTREN LIPID MICROSPHERE
1.0000 mL | INTRAVENOUS | Status: AC | PRN
  Administered 2020-09-18: 2 mL via INTRAVENOUS
  Filled 2020-09-18: qty 10

## 2020-09-18 NOTE — Progress Notes (Signed)
Patient has a foley in and he has bright red urine. He also states that he is uncomfortable so when he feels the urge to pee he as to stand. MD notify.

## 2020-09-18 NOTE — Progress Notes (Signed)
PT Cancellation Note  Patient Details Name: [name]. MRN: [id number] DOB: 28-Dec-1950   Cancelled Treatment:    Reason Eval/Treat Not Completed: Patient at procedure or test/unavailable at HD and unavailable for PT. Will attempt on next date of service at this point.    Windell Norfolk, DPT, PN1   Supplemental Physical Therapist [facility]    Pager [phone] Acute Rehab Office [phone]

## 2020-09-18 NOTE — Progress Notes (Signed)
ANTICOAGULATION CONSULT NOTE - Follow Up Consult  Pharmacy Consult for IV heparin Indication: atrial fibrillation  Allergies  Allergen Reactions  . Codeine Swelling    Water retention  . Sulfur Other (See Comments)    Not sure of reaction a young child    Patient Measurements: Height: 5\' 9"  (175.3 cm) Weight: 132.5 kg (292 lb 1.8 oz) IBW/kg (Calculated) : 70.7 Heparin Dosing Weight: 93kg  Vital Signs: Temp: 98 F (36.7 C) (11/04 0816) Temp Source: Oral (11/04 0816) BP: 95/84 (11/04 0813) Pulse Rate: 67 (11/04 0816)  Labs: Recent Labs    09/16/20 0456 09/16/20 0456 09/16/20 1359 09/16/20 1359 09/16/20 2043 09/17/20 0600 09/18/20 0202  HGB 11.6*   < >  --   --   --  11.0* 10.7*  HCT 38.3*  --   --   --   --  36.0* 34.1*  PLT 320  --   --   --   --  350 343  APTT 50*   < > 74*  --  96* 124*  --   HEPARINUNFRC 0.74*   < > 0.51   < > 0.45 0.56 0.51  CREATININE 5.91*  --   --   --   --  6.79* 6.09*   < > = values in this interval not displayed.    Estimated Creatinine Clearance: 15 mL/min (A) (by C-G formula based on SCr of 6.09 mg/dL (H)).   Medications:  Scheduled:  . (feeding supplement) PROSource Plus  30 mL Oral QID  . amoxicillin-clavulanate  1 tablet Oral Daily  . Chlorhexidine Gluconate Cloth  6 each Topical Daily  . Chlorhexidine Gluconate Cloth  6 each Topical Q0600  . doxycycline  100 mg Oral Q12H  . insulin aspart  0-15 Units Subcutaneous TID WC  . insulin aspart  0-5 Units Subcutaneous QHS  . metoprolol tartrate  50 mg Oral BID  . midodrine  10 mg Oral TID WC    Assessment: 69yoM admitted after a fall and was found to have osteomyelitis of his feet. PMH s/f atrial fibrillation on apixaban. Patient was transitioned from apixaban to heparin for possible surgery. He declined surgery, was restarted on apixaban.   Pharmacy was consulted for transition back to heparin for HD catheter replacement. Last dose of apixaban given 11/1, s/p HD cath placement  on 11/2.  HL today = 0.51, therapeutic. Hgb down slightly today to 10.7, PLT WNL.  Of note, patient's foley showed hematuria - suspect catheter-related trauma per note and may need to hold heparin if it does not resolve.   Goal of Therapy:  Heparin level 0.3-0.7 units/ml Monitor platelets by anticoagulation protocol: Yes   Plan:  Continue heparin 1,750 units/hr  Monitor daily heparin level and CBC Will continue to closely monitor for bleeding Follow plans for heparin vs oral anticoagulation  Mercy Riding, PharmD PGY1 Acute Care Pharmacy Resident Please refer to [facility] for unit-specific pharmacist

## 2020-09-18 NOTE — Progress Notes (Signed)
[facility] Progress Note  Subjective: Patient denies any complaints today.  Tolerated dialysis with no issues.  2.5 L of ultrafiltration yesterday.  Patient feels that his lower extremity edema is about the same  Vitals:   09/18/20 0520 09/18/20 0525 09/18/20 0813 09/18/20 0816  BP: (!) 83/60 (!) 124/59 95/84   Pulse: (!) 109 93  67  Resp:    18  Temp:    98 F (36.7 C)  TempSrc:    Oral  SpO2:    99%  Weight:      Height:        Exam:  GEN: Chronically ill-appearing, sitting in chair, no distress ENT: no nasal discharge, mmm EYES: no scleral icterus, eomi CV: Normal rate, extensive lower extremity edema bilaterally PULM: no iwob, bilateral chest rise ABD: NABS, distended abdomen SKIN: Bilateral venous stasis changes with discoloration of the toes, otherwise no rashes or jaundice     UA 10/31 - 100 prot, 0-5 rbc/ wbc, rare bact    UNa < 40, UCr 166.2    RUS 10/31 - 14.5/ 14.6 cm kidneys w/o hydro, normal echotexture, normal bladder appearance, limited study d/t body habitus   Summary: 69 yo hx diast CHF, DM2, afib , HTN found down in [place] during welfare check, currently f/b APS. Pt reported fall in [place] same day. Pt reported stopping all medications mos ago except torsemide, "didn't think I needed them anymore". In ED had afib /RVR and diabetic foot infection, started on IV vanc/ cefepime, po metoprolol started and pt admitted. MRI showed OM R foot. B/L creat is 1.3- 1.4, creat here 1.9 on admit, creatinine is continued to rise consult for acute renal failure  Assessment/ Plan: AKI on CKD 3b - BL creat 1.3-1.9, eGFR 35- 51 ml/min.  Creatinine and BUN continue to rise.  Has had borderline low blood pressures and extensive volume overload.  Kidney injury likely multifactorial with cardiorenal syndrome, ATN contributing.  Started dialysis on 11/3 tolerated well.  Continue dialysis with volume removal as able.  Will monitor for potential renal recovery  2. CHF with  diastolic and systolic component w/ decreased EF of 30 to 35% on echocardiogram today.  Continues to be very volume overloaded likely to cardiorenal syndrome.  Continue volume removal with dialysis.  On midodrine.  Reinstitution of Lasix could be considered soon. 3. HTN - relatively hypotensive at this time. BP's 100's. On metoprolol xl 50 for afib.  New midodrine at current dose 4. T2DM - management per primary 5. Afib w/ RVR - continue rate control as tolerated also with hypotension 6. Sepsis w/ osteomyelitis of the 5th toe on rt foot -antibiotics per primary team     [name]  09/18/2020, 12:30 PM   Recent Labs  Lab 09/17/20 0600 09/18/20 0202  K 4.3 3.7  BUN 118* 85*  CREATININE 6.79* 6.09*  CALCIUM 8.1* 8.0*  PHOS 9.0* 7.0*  HGB 11.0* 10.7*   Inpatient medications: . (feeding supplement) PROSource Plus  30 mL Oral QID  . amoxicillin-clavulanate  1 tablet Oral Daily  . Chlorhexidine Gluconate Cloth  6 each Topical Daily  . Chlorhexidine Gluconate Cloth  6 each Topical Q0600  . doxycycline  100 mg Oral Q12H  . insulin aspart  0-15 Units Subcutaneous TID WC  . insulin aspart  0-5 Units Subcutaneous QHS  . metoprolol tartrate  50 mg Oral BID  . midodrine  10 mg Oral TID WC   . heparin 1,750 Units/hr (09/18/20 0144)  acetaminophen **OR** acetaminophen, HYDROmorphone (DILAUDID) injection, labetalol, lidocaine, melatonin, sodium chloride flush

## 2020-09-18 NOTE — Plan of Care (Signed)
  Problem: Education: Goal: Knowledge of General Education information will improve Description Including pain rating scale, medication(s)/side effects and non-pharmacologic comfort measures Outcome: Progressing   

## 2020-09-18 NOTE — Progress Notes (Signed)
PROGRESS NOTE    [place].  EXH:[id number] DOB: [birth year] DOA: 09/08/2020 PCP: Keith Dials, MD   Chief Complaint  Patient presents with  . Wound Infection    Brief Narrative:  69 year old male with F8BO, chronic systolic and diastolic CHF, PAF supposed to be on Eliquis, HTN, morbid obesity brought to ED 09/08/2020 after he was found down in his house.  Reportedly patient was followed.  Protective services, has very unsanitary living condition at home and was found down in the bathroom during welfare check.  Per report: Patient reports falling while standing up from bathroom without LOC, resulting in fall onto R chest and resultant R lower chest pain. Patient was found down and brought into ED.  Of note, patient reports stopping all medications several months ago with exception of torsemide because "I didn't think I needed them anymore." Pt has only been taking torsemide PRN swelling.  He was found to have necrotic foot infections and underwent xray of right foot, which revealed subcortical lucency involving 5th digit concerning for OM.  He was started on vancomycin and cefepime and orthopedic surgery was consulted. ABIs were also ordered. Test was inconclusive due to severe lower extremity edema.   Initially patient was also against surgical treatment options despite severity of his wounds, their obvious chronicity, and inability to explain consequences for avoiding surgery. We requested psychiatry to evaluate patient for decision making ability in this setting. After repeat explanation of his diagnosis and possible treatment options he was able to voice adequate understanding to psychiatry and was considered to have decision making capacity regarding his current infection.   He will need to continue with serial compression wrappings at discharge in efforts to promote better chance of wound healing and proper assessment of ABIs to help decide best treatment course.    Given his significant deconditioning and after discussion with his daughter, there is concern for his ability to safely return home due to poor living situation; Physical therapy was consulted. After PT evaluation he was recommended for SNF at discharge. He was accepting of this and placement was pursued.  Overall, he had good clinical response to IV antibiotics. He was deescalated to Augmentin and doxycycline to finish out a course. He will continue with serial compression wrappings at discharge to rehab as well as following up with Ortho for further management.  During hospitalization he was started on IV Lasix for diuresis given his severe lower extremity edema. His creatinine continued to slowly uptrend. Urine output measuring was inaccurate and difficult to gauge response. Nephrology was consulted on 09/14/2020 given his uptrending creatinine in setting of multiple antibiotics initially and some transient hypotension.  Renal ultrasound showed no hydronephrosis.  He underwent temporary HD catheter placement by IR on 09/16/2020 and nephrology recommended transfer to [facility] for possible short-term hemodialysis. Noncompliant in general and very self-neglected with denial component. Admitted with horrendous feet/legs. Has osteo in R foot 5th digit. Very well possibly in left foot too chronically.  Too edematous for good ABI's per ortho so getting serial compression wrapping with plans for outpt follow up with ortho (on augmentin/doxy now for OM tx).  Patient was transferred to [facility] 11/3 and dialysis was started.  Subjective: Patient is alert awake had some suprapubic pain Denies any complaint.  He is on room air. Afebrile overnight, episode of hypotension in the 80s to 90s overnight-has had similar hypotension episode. Hematuria noted this morning.  He had Foley inserted 11/3  BUN 85 creatinine  6.0, bicarb 18, hemoglobin 10.7 g with WBC count 12.5 from  10.4.  Assessment & Plan:  AKI on CKD stage III with baseline creatinine 1.2-1.3. seen by Nephrology has undergone extensive work-up with renal ultrasound, unyielding so far status post temporary HD catheter by IR 12/2 and started on dialysis 11/3.  Monitor intake output renal function, avoid nephrotoxic medication.  Continue plan as per nephrology.  He is going for dialysis again today. Recent Labs  Lab 09/14/20 0607 09/15/20 0436 09/16/20 0456 09/17/20 0600 09/18/20 0202  BUN 87* 88* 105* 118* 85*  CREATININE 3.72* 4.79* 5.91* 6.79* 6.09*   Severe sepsis POA: Resolved Osteomyelitis of fifth toe of right foot, portable osteomyelitis per x-ray, ABI indeterminate due to lower extremity edema.  Serial compression to aid with improving edema, orthopedics had evaluated and need outpatient follow-up.  Initially on IV antibiotics which have been switched to oral Augmentin and doxycycline.  Acute metabolic encephalopathy found down at home secondary to uremia mental status is stable.  Mental status seems to have improved.  Continue supportive measures.    PAF with RVR, noncompliant with meds, intolerant tachycardic, continue metoprolol, Eliquis is on hold due to AKI and currently on heparin  Hematuria after placing Foley catheter suspect catheter related trauma: monitor closely on heparin if ongoing/not resolving will need to hold heparin.  Able to flush catheter, bladder scan was negative monitor closely for recurrence and if persistent will notify urology-discussed with Dr. Claudia [name].  Acute on chronic combined systolic/diastolic CHF/chronic lower extremity edema, EF 40 to 45% in March, Lasix on hold due to AKI continue fluid management and dialysis and monitor intake output and daily weight  Essential hypertension blood pressure hypotensive at times, continue metoprolol with holding parameters  Type 2 diabetes mellitus: A1c 9.6 10/25, poorly controlled, blood sugar fairly stable continue SSI and  CBG monitoring Recent Labs  Lab 09/17/20 1145 09/17/20 1749 09/17/20 2123 09/18/20 0635 09/18/20 1145  GLUCAP 157* 124* 153* 147* 140*   Morbid obesity with BMI 43, will benefit with weight loss healthy lifestyle and outpatient sleep apnea evaluation.  Nutrition: Diet Order            Diet Carb Modified Fluid consistency: Thin; Room service appropriate? Yes  Diet effective now                 Nutrition Problem: Increased nutrient needs Etiology: wound healing, acute illness Signs/Symptoms: estimated needs Interventions: MVI, Other (Comment) (Prosource Plus)  Body mass index is 43.17 kg/m.  Pressure Ulcer: Pressure Injury 09/08/20 Heel Right Stage 3 -  Full thickness tissue loss. Subcutaneous fat may be visible but bone, tendon or muscle are NOT exposed. 25% red, 75% slouogh, strong odor, mod brown (Active)  09/08/20   Location: Heel  Location Orientation: Right  Staging: Stage 3 -  Full thickness tissue loss. Subcutaneous fat may be visible but bone, tendon or muscle are NOT exposed.  Wound Description (Comments): 25% red, 75% slouogh, strong odor, mod brown  Present on Admission: Yes     Pressure Injury 09/17/20 (Active)  09/17/20 1805  Location:   Location Orientation:   Staging:   Wound Description (Comments):   Present on Admission:     DVT prophylaxis: heparin gtt Code Status:   Code Status: Full Code  Family Communication: plan of care discussed with patient at bedside.  Discussed with patient's daughter who is visiting from [place]. Status is: Inpatient  Remains inpatient appropriate because:IV treatments appropriate due to intensity of illness or  inability to take PO and Inpatient level of care appropriate due to severity of illness   Dispo: The patient is from: Home              Anticipated d/c is to: SNF              Anticipated d/c date is: > 3 days              Patient currently is not medically stable to d/c.    Consultants:see note   Procedures:see note  Culture/Microbiology    Component Value Date/Time   SDES  09/08/2020 2103    BLOOD LEFT HAND Performed at [facility], [place] [address] [place], [place] [address]    SPECREQUEST  09/08/2020 2103    BOTTLES DRAWN AEROBIC AND ANAEROBIC Blood Culture adequate volume Performed at [facility] [address] [place], [place] [address]    CULT  09/08/2020 2103    NO GROWTH 5 DAYS Performed at [facility] [address] [place], [place] [address]    REPTSTATUS 09/13/2020 FINAL 09/08/2020 2103    Other culture-see note  Medications: Scheduled Meds: . heparin sodium (porcine)      . (feeding supplement) PROSource Plus  30 mL Oral QID  . amoxicillin-clavulanate  1 tablet Oral Daily  . Chlorhexidine Gluconate Cloth  6 each Topical Daily  . Chlorhexidine Gluconate Cloth  6 each Topical Q0600  . doxycycline  100 mg Oral Q12H  . heparin sodium (porcine)      . insulin aspart  0-15 Units Subcutaneous TID WC  . insulin aspart  0-5 Units Subcutaneous QHS  . metoprolol tartrate  50 mg Oral BID  . midodrine  10 mg Oral TID WC   Continuous Infusions: . heparin 1,750 Units/hr (09/18/20 0144)    Antimicrobials: Anti-infectives (From admission, onward)   Start     Dose/Rate Route Frequency Ordered Stop   09/17/20 1000  amoxicillin-clavulanate (AUGMENTIN) 500-125 MG per tablet 500 mg        1 tablet Oral Daily 09/17/20 0839     09/14/20 2200  amoxicillin-clavulanate (AUGMENTIN) 500-125 MG per tablet 500 mg  Status:  Discontinued        1 tablet Oral 2 times daily 09/14/20 1524 09/17/20 0839   09/12/20 1000  amoxicillin-clavulanate (AUGMENTIN) 875-125 MG per tablet 1 tablet  Status:  Discontinued        1 tablet Oral Every 12 hours 09/12/20 0823 09/14/20 1524   09/12/20 1000  doxycycline (VIBRA-TABS) tablet 100 mg        100 mg Oral Every 12 hours 09/12/20 0823     09/10/20 2000  DAPTOmycin (CUBICIN) 740 mg in sodium  chloride 0.9 % IVPB  Status:  Discontinued        740 mg 229.6 mL/hr over 30 Minutes Intravenous Daily 09/10/20 1230 09/12/20 0822   09/10/20 1200  metroNIDAZOLE (FLAGYL) IVPB 500 mg  Status:  Discontinued        500 mg 100 mL/hr over 60 Minutes Intravenous Every 8 hours 09/10/20 1050 09/12/20 0822   09/10/20 1132  vancomycin variable dose per unstable renal function (pharmacist dosing)  Status:  Discontinued         Does not apply See admin instructions 09/10/20 1132 09/10/20 1230   09/09/20 1700  vancomycin (VANCOREADY) IVPB 1500 mg/300 mL  Status:  Discontinued        1,500 mg 150 mL/hr over 120 Minutes Intravenous Every 24 hours  09/08/20 1956 09/10/20 1132   09/09/20 1000  ceFEPIme (MAXIPIME) 2 g in sodium chloride 0.9 % 100 mL IVPB  Status:  Discontinued        2 g 200 mL/hr over 30 Minutes Intravenous Every 12 hours 09/08/20 1947 09/12/20 0822   09/08/20 2200  ceFEPIme (MAXIPIME) 2 g in sodium chloride 0.9 % 100 mL IVPB        2 g 200 mL/hr over 30 Minutes Intravenous  Once 09/08/20 1754 09/08/20 2206   09/08/20 1800  vancomycin (VANCOCIN) IVPB 1000 mg/200 mL premix  Status:  Discontinued        1,000 mg 200 mL/hr over 60 Minutes Intravenous  Once 09/08/20 1754 09/08/20 1756   09/08/20 1630  vancomycin (VANCOREADY) IVPB 2000 mg/400 mL        2,000 mg 200 mL/hr over 120 Minutes Intravenous  Once 09/08/20 1551 09/08/20 1842   09/08/20 1600  piperacillin-tazobactam (ZOSYN) IVPB 3.375 g        3.375 g 100 mL/hr over 30 Minutes Intravenous  Once 09/08/20 1549 09/08/20 1645     Objective: Vitals: Today's Vitals   09/18/20 1432 09/18/20 1442 09/18/20 1452 09/18/20 1502  BP:   (!) 110/56   Pulse:      Resp: 16 15 14 15   Temp:      TempSrc:      SpO2:      Weight:      Height:      PainSc:        Intake/Output Summary (Last 24 hours) at 09/18/2020 1544 Last data filed at 09/18/2020 1100 Gross per 24 hour  Intake 665.02 ml  Output 2760 ml  Net -2094.98 ml   Filed Weights    09/17/20 1314 09/17/20 1635 09/18/20 1352  Weight: 135.2 kg 132.5 kg 132.6 kg   Weight change:   Intake/Output from previous day: 11/03 0701 - 11/04 0700 In: 674.8 [P.O.:220; I.V.:454.8] Out: 2760 [Urine:260] Intake/Output this shift: Total I/O In: 130 [P.O.:120; Other:10] Out: -   Examination: General exam: AAO, obses,NAD, weak appearing. HEENT:Oral mucosa moist, Ear/Nose WNL grossly,dentition normal. Respiratory system: bilaterally diminished,no use of accessory muscle, non tender. Cardiovascular system: S1 & S2 +, regular, No JVD. Gastrointestinal system: Abdomen soft, NT,ND, BS+. Nervous System:Alert, awake, moving extremities and grossly nonfocal Extremities: Bilateral edematous lower extremities with una boot and dressing in place , long toenails.   Skin: No rashes,no icterus. MSK: Normal muscle bulk,tone, power TDC+  Data Reviewed: I have personally reviewed following labs and imaging studies CBC: Recent Labs  Lab 09/14/20 0607 09/15/20 0436 09/16/20 0456 09/17/20 0600 09/18/20 0202  WBC 10.8* 11.8* 10.5 10.4 12.5*  NEUTROABS 8.0* 8.5* 7.4 7.6 9.4*  HGB 10.9* 11.4* 11.6* 11.0* 10.7*  HCT 35.2* 37.9* 38.3* 36.0* 34.1*  MCV 87.3 90.2 88.7 87.4 84.0  PLT 295 326 320 350 240   Basic Metabolic Panel: Recent Labs  Lab 09/14/20 0607 09/14/20 1222 09/15/20 0436 09/16/20 0456 09/17/20 0600 09/18/20 0202  NA 133*  --  136 135 136 136  K 3.4*  --  4.0 4.3 4.3 3.7  CL 102  --  105 106 105 104  CO2 17*  --  16* 16* 16* 18*  GLUCOSE 233*  --  187* 168* 169* 159*  BUN 87*  --  88* 105* 118* 85*  CREATININE 3.72*  --  4.79* 5.91* 6.79* 6.09*  CALCIUM 7.9*  --  8.1* 7.9* 8.1* 8.0*  MG 1.9  --  1.9 2.0 2.2  1.7  PHOS  --  6.3* 7.0* 8.0* 9.0* 7.0*   GFR: Estimated Creatinine Clearance: 15 mL/min (A) (by C-G formula based on SCr of 6.09 mg/dL (H)). Liver Function Tests: No results for input(s): AST, ALT, ALKPHOS, BILITOT, PROT, ALBUMIN in the last 168 hours. No  results for input(s): LIPASE, AMYLASE in the last 168 hours. No results for input(s): AMMONIA in the last 168 hours. Coagulation Profile: No results for input(s): INR, PROTIME in the last 168 hours. Cardiac Enzymes: Recent Labs  Lab 09/14/20 1222  CKTOTAL 41*   BNP (last 3 results) No results for input(s): PROBNP in the last 8760 hours. HbA1C: No results for input(s): HGBA1C in the last 72 hours. CBG: Recent Labs  Lab 09/17/20 1145 09/17/20 1749 09/17/20 2123 09/18/20 0635 09/18/20 1145  GLUCAP 157* 124* 153* 147* 140*   Lipid Profile: No results for input(s): CHOL, HDL, LDLCALC, TRIG, CHOLHDL, LDLDIRECT in the last 72 hours. Thyroid Function Tests: No results for input(s): TSH, T4TOTAL, FREET4, T3FREE, THYROIDAB in the last 72 hours. Anemia Panel: No results for input(s): VITAMINB12, FOLATE, FERRITIN, TIBC, IRON, RETICCTPCT in the last 72 hours. Sepsis Labs: No results for input(s): PROCALCITON, LATICACIDVEN in the last 168 hours.  Recent Results (from the past 240 hour(s))  Respiratory Panel by RT PCR (Flu A&B, Covid) - Nasopharyngeal Swab     Status: None   Collection Time: 09/08/20  5:13 PM   Specimen: Nasopharyngeal Swab  Result Value Ref Range Status   SARS Coronavirus 2 by RT PCR NEGATIVE NEGATIVE Final    Comment: (NOTE) SARS-CoV-2 target nucleic acids are NOT DETECTED.  The SARS-CoV-2 RNA is generally detectable in upper respiratoy specimens during the acute phase of infection. The lowest concentration of SARS-CoV-2 viral copies this assay can detect is 131 copies/mL. A negative result does not preclude SARS-Cov-2 infection and should not be used as the sole basis for treatment or other patient management decisions. A negative result may occur with  improper specimen collection/handling, submission of specimen other than nasopharyngeal swab, presence of viral mutation(s) within the areas targeted by this assay, and inadequate number of viral copies (<131  copies/mL). A negative result must be combined with clinical observations, patient history, and epidemiological information. The expected result is Negative.  Fact Sheet for Patients:  PinkCheek.be  Fact Sheet for Healthcare Providers:  GravelBags.it  This test is no t yet approved or cleared by the [place] FDA and  has been authorized for detection and/or diagnosis of SARS-CoV-2 by FDA under an Emergency Use Authorization (EUA). This EUA will remain  in effect (meaning this test can be used) for the duration of the COVID-19 declaration under Section 564(b)(1) of the Act, 21 U.S.C. section 360bbb-3(b)(1), unless the authorization is terminated or revoked sooner.     Influenza A by PCR NEGATIVE NEGATIVE Final   Influenza B by PCR NEGATIVE NEGATIVE Final    Comment: (NOTE) The Xpert Xpress SARS-CoV-2/FLU/RSV assay is intended as an aid in  the diagnosis of influenza from Nasopharyngeal swab specimens and  should not be used as a sole basis for treatment. Nasal washings and  aspirates are unacceptable for Xpert Xpress SARS-CoV-2/FLU/RSV  testing.  Fact Sheet for Patients: PinkCheek.be  Fact Sheet for Healthcare Providers: GravelBags.it  This test is not yet approved or cleared by the [place] FDA and  has been authorized for detection and/or diagnosis of SARS-CoV-2 by  FDA under an Emergency Use Authorization (EUA). This EUA will remain  in effect (meaning  this test can be used) for the duration of the  Covid-19 declaration under Section 564(b)(1) of the Act, 21  U.S.C. section 360bbb-3(b)(1), unless the authorization is  terminated or revoked. Performed at [facility], [place] [address], [place], [place] [address]   Blood Cultures x 2 sites     Status: None   Collection Time: 09/08/20  7:04 PM   Specimen: BLOOD  Result Value Ref  Range Status   Specimen Description   Final    BLOOD LEFT WRIST Performed at [facility] [address] [place], [place] [address]    Special Requests   Final    BOTTLES DRAWN AEROBIC AND ANAEROBIC Blood Culture adequate volume Performed at [facility] [address] [place], [place] [address]    Culture   Final    NO GROWTH 5 DAYS Performed at [facility], [place] [address] [place], [place] [address]    Report Status 09/13/2020 FINAL  Final  Culture, blood (x 2)     Status: None   Collection Time: 09/08/20  9:03 PM   Specimen: BLOOD LEFT HAND  Result Value Ref Range Status   Specimen Description   Final    BLOOD LEFT HAND Performed at [facility] [address] [place], [place] [address]    Special Requests   Final    BOTTLES DRAWN AEROBIC AND ANAEROBIC Blood Culture adequate volume Performed at [facility] [address] [place],  [address]    Culture   Final    NO GROWTH 5 DAYS Performed at [facility], [place] [address], [place],  [address]    Report Status 09/13/2020 FINAL  Final     Radiology Studies: ECHOCARDIOGRAM COMPLETE  Result Date: 09/18/2020    ECHOCARDIOGRAM REPORT   Patient Name:   [name]. Date of Exam: 09/18/2020 Medical Rec #:  [id number]           Height:       69.0 in Accession #:    [id number]          Weight:       292.1 lb Date of Birth:  Aug 10, 1951            BSA:          2.427 m Patient Age:    29 years            BP:           95/84 mmHg Patient Gender: M                   HR:           96 bpm. Exam Location:  Inpatient Procedure: 2D Echo, Cardiac Doppler, Color Doppler and Intracardiac            Opacification Agent Indications:    Cardiorenal syndrome. ; I50.9* Heart failure (unspecified)  History:        Patient has prior history of Echocardiogram examinations, most                 recent 01/31/2020. Abnormal ECG, Arrythmias:Atrial  Fibrillation;                 Risk Factors:Hypertension and Diabetes.  Sonographer:    Roseanna Rainbow RDCS Referring Phys: [id number]  Sonographer Comments: Image acquisition challenging due to patient body habitus. IMPRESSIONS  1. Heavy smoke in the left ventricle  consistent with pre-thrombotic state, no thrombus was seen on Definity echocontrast images.  2. Left ventricular ejection fraction, by estimation, is 30 to 35%. The left ventricle has moderately decreased function. The left ventricle demonstrates global hypokinesis. There is severe concentric left ventricular hypertrophy. Left ventricular diastolic function could not be evaluated. There is the interventricular septum is flattened in systole and diastole, consistent with right ventricular pressure and volume overload.  3. Right ventricular systolic function is moderately reduced. The right ventricular size is moderately enlarged. There is mildly elevated pulmonary artery systolic pressure. The estimated right ventricular systolic pressure is 46.2 mmHg.  4. Left atrial size was severely dilated.  5. Right atrial size was moderately dilated.  6. The mitral valve is normal in structure. Mild mitral valve regurgitation. No evidence of mitral stenosis.  7. Tricuspid valve regurgitation is moderate.  8. The aortic valve is normal in structure. Aortic valve regurgitation is not visualized. No aortic stenosis is present.  9. The inferior vena cava is normal in size with greater than 50% respiratory variability, suggesting right atrial pressure of 3 mmHg. FINDINGS  Left Ventricle: Left ventricular ejection fraction, by estimation, is 30 to 35%. The left ventricle has moderately decreased function. The left ventricle demonstrates global hypokinesis. Definity contrast agent was given IV to delineate the left ventricular endocardial borders. The left ventricular internal cavity size was normal in size. There is severe concentric left ventricular hypertrophy.  Abnormal (paradoxical) septal motion, consistent with left bundle branch block and the interventricular septum is flattened in systole and diastole, consistent with right ventricular pressure and volume overload. Left ventricular diastolic function could not be evaluated due to atrial fibrillation. Left ventricular diastolic function could  not be evaluated. Right Ventricle: The right ventricular size is moderately enlarged. No increase in right ventricular wall thickness. Right ventricular systolic function is moderately reduced. There is mildly elevated pulmonary artery systolic pressure. The tricuspid regurgitant velocity is 2.49 m/s, and with an assumed right atrial pressure of 8 mmHg, the estimated right ventricular systolic pressure is 70.3 mmHg. Left Atrium: Left atrial size was severely dilated. Right Atrium: Right atrial size was moderately dilated. Pericardium: There is no evidence of pericardial effusion. Mitral Valve: The mitral valve is normal in structure. Mild mitral valve regurgitation. No evidence of mitral valve stenosis. Tricuspid Valve: The tricuspid valve is normal in structure. Tricuspid valve regurgitation is moderate . No evidence of tricuspid stenosis. Aortic Valve: The aortic valve is normal in structure. Aortic valve regurgitation is not visualized. No aortic stenosis is present. Pulmonic Valve: The pulmonic valve was normal in structure. Pulmonic valve regurgitation is not visualized. No evidence of pulmonic stenosis. Aorta: The aortic root is normal in size and structure. Venous: The inferior vena cava is normal in size with greater than 50% respiratory variability, suggesting right atrial pressure of 3 mmHg. IAS/Shunts: No atrial level shunt detected by color flow Doppler.  LEFT VENTRICLE PLAX 2D LVIDd:         4.80 cm LVIDs:         4.30 cm LV PW:         2.20 cm LV IVS:        1.60 cm LVOT diam:     2.40 cm LV SV:         57 LV SV Index:   23 LVOT Area:     4.52 cm  LV Volumes (MOD)  LV vol d, MOD A2C: 113.0 ml LV vol d, MOD A4C: 150.0 ml LV  vol s, MOD A2C: 59.9 ml LV vol s, MOD A4C: 75.3 ml LV SV MOD A2C:     53.1 ml LV SV MOD A4C:     150.0 ml LV SV MOD BP:      68.9 ml RIGHT VENTRICLE            IVC RV S prime:     6.85 cm/s  IVC diam: 3.00 cm TAPSE (M-mode): 1.2 cm LEFT ATRIUM             Index       RIGHT ATRIUM           Index LA diam:        5.80 cm 2.39 cm/m  RA Area:     16.70 cm LA Vol (A2C):   64.7 ml 26.66 ml/m RA Volume:   38.90 ml  16.03 ml/m LA Vol (A4C):   87.3 ml 35.97 ml/m LA Biplane Vol: 80.0 ml 32.96 ml/m  AORTIC VALVE LVOT Vmax:   86.20 cm/s LVOT Vmean:  49.500 cm/s LVOT VTI:    0.126 m  AORTA Ao Root diam: 3.85 cm Ao Asc diam:  3.70 cm MITRAL VALVE               TRICUSPID VALVE MV Area (PHT): 4.97 cm    TR Peak grad:   24.8 mmHg MV Decel Time: 153 msec    TR Vmax:        249.00 cm/s MV E velocity: 68.43 cm/s                            SHUNTS                            Systemic VTI:  0.13 m                            Systemic Diam: 2.40 cm Ena Dawley MD Electronically signed by Ena Dawley MD Signature Date/Time: 09/18/2020/12:18:16 PM    Final      LOS: 10 days   Antonieta Pert, MD [facility]  09/18/2020, 3:44 PM

## 2020-09-18 NOTE — Progress Notes (Signed)
  Echocardiogram 2D Echocardiogram has been performed.  [name] 09/18/2020, 9:47 AM

## 2020-09-19 DIAGNOSIS — M869 Osteomyelitis, unspecified: Secondary | ICD-10-CM | POA: Diagnosis not present

## 2020-09-19 LAB — CBC WITH DIFFERENTIAL/PLATELET
Abs Immature Granulocytes: 0.1 10*3/uL — ABNORMAL HIGH (ref 0.00–0.07)
Basophils Absolute: 0.1 10*3/uL (ref 0.0–0.1)
Basophils Relative: 1 %
Eosinophils Absolute: 0.2 10*3/uL (ref 0.0–0.5)
Eosinophils Relative: 2 %
HCT: 33.2 % — ABNORMAL LOW (ref 39.0–52.0)
Hemoglobin: 10.5 g/dL — ABNORMAL LOW (ref 13.0–17.0)
Immature Granulocytes: 1 %
Lymphocytes Relative: 20 %
Lymphs Abs: 2 10*3/uL (ref 0.7–4.0)
MCH: 27.3 pg (ref 26.0–34.0)
MCHC: 31.6 g/dL (ref 30.0–36.0)
MCV: 86.2 fL (ref 80.0–100.0)
Monocytes Absolute: 1 10*3/uL (ref 0.1–1.0)
Monocytes Relative: 10 %
Neutro Abs: 6.6 10*3/uL (ref 1.7–7.7)
Neutrophils Relative %: 66 %
Platelets: 304 10*3/uL (ref 150–400)
RBC: 3.85 MIL/uL — ABNORMAL LOW (ref 4.22–5.81)
RDW: 14.9 % (ref 11.5–15.5)
WBC: 9.8 10*3/uL (ref 4.0–10.5)
nRBC: 0 % (ref 0.0–0.2)

## 2020-09-19 LAB — BASIC METABOLIC PANEL
Anion gap: 13 (ref 5–15)
BUN: 52 mg/dL — ABNORMAL HIGH (ref 8–23)
CO2: 23 mmol/L (ref 22–32)
Calcium: 7.9 mg/dL — ABNORMAL LOW (ref 8.9–10.3)
Chloride: 98 mmol/L (ref 98–111)
Creatinine, Ser: 5.03 mg/dL — ABNORMAL HIGH (ref 0.61–1.24)
GFR, Estimated: 12 mL/min — ABNORMAL LOW (ref >60)
Glucose, Bld: 126 mg/dL — ABNORMAL HIGH (ref 70–99)
Potassium: 3.2 mmol/L — ABNORMAL LOW (ref 3.5–5.1)
Sodium: 134 mmol/L — ABNORMAL LOW (ref 135–145)

## 2020-09-19 LAB — GLUCOSE, CAPILLARY
Glucose-Capillary: 113 mg/dL — ABNORMAL HIGH (ref 70–99)
Glucose-Capillary: 118 mg/dL — ABNORMAL HIGH (ref 70–99)
Glucose-Capillary: 160 mg/dL — ABNORMAL HIGH (ref 70–99)
Glucose-Capillary: 124 mg/dL — ABNORMAL HIGH (ref 70–99)

## 2020-09-19 LAB — HEPARIN LEVEL (UNFRACTIONATED): Heparin Unfractionated: 0.43 IU/mL (ref 0.30–0.70)

## 2020-09-19 LAB — MAGNESIUM: Magnesium: 1.8 mg/dL (ref 1.7–2.4)

## 2020-09-19 LAB — PHOSPHORUS: Phosphorus: 6.7 mg/dL — ABNORMAL HIGH (ref 2.5–4.6)

## 2020-09-19 MED ORDER — HEPARIN SODIUM (PORCINE) 1000 UNIT/ML IJ SOLN
INTRAMUSCULAR | Status: AC
  Administered 2020-09-19: 2800 [IU]
  Filled 2020-09-19: qty 3

## 2020-09-19 MED ORDER — FUROSEMIDE 10 MG/ML IJ SOLN
120.0000 mg | Freq: Three times a day (TID) | INTRAVENOUS | Status: DC
  Administered 2020-09-20 – 2020-09-22 (×7): 120 mg via INTRAVENOUS
  Filled 2020-09-19: qty 12
  Filled 2020-09-19: qty 2
  Filled 2020-09-19: qty 10
  Filled 2020-09-19: qty 12
  Filled 2020-09-19: qty 10
  Filled 2020-09-19: qty 2
  Filled 2020-09-19: qty 12
  Filled 2020-09-19: qty 10
  Filled 2020-09-19: qty 12

## 2020-09-19 MED ORDER — HEPARIN SODIUM (PORCINE) 1000 UNIT/ML IJ SOLN
INTRAMUSCULAR | Status: AC
  Filled 2020-09-19: qty 6

## 2020-09-19 NOTE — Progress Notes (Signed)
PT Cancellation Note  Patient Details Name: [name]. MRN: [id number] DOB: 05-07-1951   Cancelled Treatment:    Reason Eval/Treat Not Completed: Patient at procedure or test/unavailable Attempted to see patient but he was again in HD. Will attempt to try back if time/schedule allow.   Windell Norfolk, DPT, PN1   Supplemental Physical Therapist [facility]    Pager [phone] Acute Rehab Office [phone]

## 2020-09-19 NOTE — Plan of Care (Signed)
  Problem: Activity: Goal: Risk for activity intolerance will decrease Outcome: Progressing   

## 2020-09-19 NOTE — Progress Notes (Signed)
PROGRESS NOTE    [place].  PZW:[id number] DOB: 1951-01-11 DOA: 09/08/2020 PCP: Aura Dials, MD   Chief Complaint  Patient presents with  . Wound Infection    Brief Narrative:  69 year old male with U2PN, chronic systolic and diastolic CHF, PAF supposed to be on Eliquis, HTN, morbid obesity brought to ED 09/08/2020 after he was found down in his house.  Reportedly patient was followed.  Protective services, has very unsanitary living condition at home and was found down in the bathroom during welfare check.  Per report: Patient reports falling while standing up from bathroom without LOC, resulting in fall onto R chest and resultant R lower chest pain. Patient was found down and brought into ED.  Of note, patient reports stopping all medications several months ago with exception of torsemide because "I didn't think I needed them anymore." Pt has only been taking torsemide PRN swelling.  He was found to have necrotic foot infections and underwent xray of right foot, which revealed subcortical lucency involving 5th digit concerning for OM.  He was started on vancomycin and cefepime and orthopedic surgery was consulted. ABIs were also ordered. Test was inconclusive due to severe lower extremity edema.   Initially patient was also against surgical treatment options despite severity of his wounds, their obvious chronicity, and inability to explain consequences for avoiding surgery. We requested psychiatry to evaluate patient for decision making ability in this setting. After repeat explanation of his diagnosis and possible treatment options he was able to voice adequate understanding to psychiatry and was considered to have decision making capacity regarding his current infection.   He will need to continue with serial compression wrappings at discharge in efforts to promote better chance of wound healing and proper assessment of ABIs to help decide best treatment course.    Given his significant deconditioning and after discussion with his daughter, there is concern for his ability to safely return home due to poor living situation; Physical therapy was consulted. After PT evaluation he was recommended for SNF at discharge. He was accepting of this and placement was pursued.  Overall, he had good clinical response to IV antibiotics. He was deescalated to Augmentin and doxycycline to finish out a course. He will continue with serial compression wrappings at discharge to rehab as well as following up with Ortho for further management.  During hospitalization he was started on IV Lasix for diuresis given his severe lower extremity edema. His creatinine continued to slowly uptrend. Urine output measuring was inaccurate and difficult to gauge response. Nephrology was consulted on 09/14/2020 given his uptrending creatinine in setting of multiple antibiotics initially and some transient hypotension.  Renal ultrasound showed no hydronephrosis.  He underwent temporary HD catheter placement by IR on 09/16/2020 and nephrology recommended transfer to [facility] for possible short-term hemodialysis. Noncompliant in general and very self-neglected with denial component. Admitted with horrendous feet/legs. Has osteo in R foot 5th digit. Very well possibly in left foot too chronically.  Too edematous for good ABI's per ortho so getting serial compression wrapping with plans for outpt follow up with ortho (on augmentin/doxy now for OM tx).  Patient was transferred to [facility] 11/3 and dialysis was started.  Subjective: Seen this morning.  Patient was getting dialysis no acute events.  Foley in place with small amount of hematuria. He had Foley inserted 11/3  Assessment & Plan:  AKI on CKD stage III with baseline creatinine 1.2-1.3. seen by Nephrology has undergone extensive work-up  with renal ultrasound, unyielding so far status post temporary HD catheter by IR  12/2 and started on dialysis 11/3, dialysis again 11/4,11/5. Multifactorial with cardiorenal syndrome, ATN contributing.  apprecaite nephrology input on board.  If he fails to respond  May need to consider transitioning to dialysis catheter to [facility] and possible left aVF. Recent Labs  Lab 09/15/20 0436 09/16/20 0456 09/17/20 0600 09/18/20 0202 09/19/20 0407  BUN 88* 105* 118* 85* 52*  CREATININE 4.79* 5.91* 6.79* 6.09* 5.03*   Acute on chronic systolic/diastolic CHF/; LVEF 40-97% in LVEF, previously back in March EF was 40-45%: TTE shows lvef 30-35%, severe concentric LVH, global hypokinesia.  Adjust fluid management with dialysis.  Hypotension soft blood pressure during dialysis starting on midodrine per nephrology  Chronic lymphedema followed by wound care.  Continue Unna boot and dressing.  Falls at home/neuropathy: cont pt/ot  Hypokalemia 3.2 adjust with dialysis per nephrology  Severe sepsis DZH:GDJMEQAS.  Osteomyelitis of fifth toe of right foot, osteomyelitis per x-ray, ABI indeterminate due to lower extremity edema.  Serial compression to aid with improving edema, orthopedics had evaluated and need outpatient follow-up.  Initially on IV antibiotics which have been switched to oral Augmentin and doxycycline.  Acute metabolic encephalopathy found down at home secondary to uremia mental status is stable.  Mental status stable and improved continue supportive care.  PAF with TMH:[id number] with meds, intolerant tachycardic, continue metoprolol, Eliquis is on hold due to AKI and currently on heparin.  Hematuria after placing foley catheter suspect catheter related trauma:monitor closely on heparin if ongoing/not resolving will need to hold heparin.Able to flush catheter,bladder scan was negative monitor closely for recurrence and if persistent will  D/w urology.  Urine output 400 mL.Monitor, keep the Foley in place.HB is stable. Cont to monitor. Recent Labs  Lab 09/16/20 0456  09/17/20 0600 09/18/20 0202 09/18/20 2101 09/19/20 0407  HGB 11.6* 11.0* 10.7* 11.0* 10.5*  HCT 38.3* 36.0* 34.1* 34.8* 33.2*   Acute on chronic combined systolic/diastolic CHF/chronic lower extremity edema, EF 40 to 45% in March, Lasix on hold due to AKI continue fluid management and dialysis and monitor intake output and daily weight.  Type 2 diabetes mellitus: A1c 9.6 10/25.  Blood sugar is stable continue sliding scale. Recent Labs  Lab 09/18/20 1145 09/18/20 1818 09/18/20 2052 09/19/20 0805 09/19/20 1348  GLUCAP 140* 111* 139* 113* 118*   Morbid obesity with BMI 43, will benefit with weight loss/healthy lifestyle and outpatient sleep apnea evaluation.    JHE:RDEYC with his daughter [name] [employer] copy)- Overall prognosis remains to be seen she is leaning towards palliative care if needed. Requesting palliative consultation.  She is coming to see to complete healthcare power of attorney at some point.  They are trying to make arrangement at home for his return renovating his house floor change or even arrange for wheelchair so that patient can be brought home ( even w/ hospice) She understands that currently he is medically needing dialysis and may need long-term.  Nutrition: Diet Order            Diet Carb Modified Fluid consistency: Thin; Room service appropriate? Yes  Diet effective now                 Nutrition Problem: Increased nutrient needs Etiology: wound healing, acute illness Signs/Symptoms: estimated needs Interventions: MVI, Other (Comment) (Prosource Plus)  Body mass index is 41.83 kg/m.  Pressure Ulcer: Pressure Injury 09/08/20 Heel Right Stage 3 -  Full thickness tissue loss. Subcutaneous  fat may be visible but bone, tendon or muscle are NOT exposed. 25% red, 75% slouogh, strong odor, mod brown (Active)  09/08/20   Location: Heel  Location Orientation: Right  Staging: Stage 3 -  Full thickness tissue loss. Subcutaneous fat may be visible but bone, tendon or  muscle are NOT exposed.  Wound Description (Comments): 25% red, 75% slouogh, strong odor, mod brown  Present on Admission: Yes     Pressure Injury 09/17/20 (Active)  09/17/20 1805  Location:   Location Orientation:   Staging:   Wound Description (Comments):   Present on Admission:    DVT prophylaxis: heparin gtt Code Status:  Code Status: Full Code  Family Communication: plan of care discussed with patient at bedside.Discussed with patient's daughter 11/4 who was visiting from [place]. I called his daughter [name].  Status KN:[id number] Remains inpatient appropriate because:IV treatments appropriate due to intensity of illness or inability to take PO and Inpatient level of care appropriate due to severity of illness  Dispo:The patient is from: Home            Anticipated d/c is to: SNF            Anticipated d/c date is: > 3 days            Patient currently is not medically stable to d/c. Consultants:see note  Procedures:see note  Culture/Microbiology    Component Value Date/Time   SDES  09/08/2020 2103    BLOOD LEFT HAND Performed at [facility], [place] [address], [place], [place] [address]    SPECREQUEST  09/08/2020 2103    BOTTLES DRAWN AEROBIC AND ANAEROBIC Blood Culture adequate volume Performed at [facility] [address] [place], [place] [address]    CULT  09/08/2020 2103    NO GROWTH 5 DAYS Performed at [facility] [address] [place], [place] [address]    REPTSTATUS 09/13/2020 FINAL 09/08/2020 2103    Other culture-see note  Medications: Scheduled Meds: . (feeding supplement) PROSource Plus  30 mL Oral QID  . amoxicillin-clavulanate  1 tablet Oral Daily  . Chlorhexidine Gluconate Cloth  6 each Topical Daily  . Chlorhexidine Gluconate Cloth  6 each Topical Q0600  . doxycycline  100 mg Oral Q12H  . heparin sodium (porcine)      . insulin aspart  0-15 Units Subcutaneous TID WC  . insulin aspart  0-5  Units Subcutaneous QHS  . metoprolol tartrate  50 mg Oral BID  . midodrine  10 mg Oral TID WC   Continuous Infusions: . [START ON 09/20/2020] furosemide    . heparin 1,750 Units/hr (09/19/20 0757)    Antimicrobials: Anti-infectives (From admission, onward)   Start     Dose/Rate Route Frequency Ordered Stop   09/17/20 1000  amoxicillin-clavulanate (AUGMENTIN) 500-125 MG per tablet 500 mg        1 tablet Oral Daily 09/17/20 0839     09/14/20 2200  amoxicillin-clavulanate (AUGMENTIN) 500-125 MG per tablet 500 mg  Status:  Discontinued        1 tablet Oral 2 times daily 09/14/20 1524 09/17/20 0839   09/12/20 1000  amoxicillin-clavulanate (AUGMENTIN) 875-125 MG per tablet 1 tablet  Status:  Discontinued        1 tablet Oral Every 12 hours 09/12/20 0823 09/14/20 1524   09/12/20 1000  doxycycline (VIBRA-TABS) tablet 100 mg        100 mg Oral Every 12 hours 09/12/20 0823  09/10/20 2000  DAPTOmycin (CUBICIN) 740 mg in sodium chloride 0.9 % IVPB  Status:  Discontinued        740 mg 229.6 mL/hr over 30 Minutes Intravenous Daily 09/10/20 1230 09/12/20 0822   09/10/20 1200  metroNIDAZOLE (FLAGYL) IVPB 500 mg  Status:  Discontinued        500 mg 100 mL/hr over 60 Minutes Intravenous Every 8 hours 09/10/20 1050 09/12/20 0822   09/10/20 1132  vancomycin variable dose per unstable renal function (pharmacist dosing)  Status:  Discontinued         Does not apply See admin instructions 09/10/20 1132 09/10/20 1230   09/09/20 1700  vancomycin (VANCOREADY) IVPB 1500 mg/300 mL  Status:  Discontinued        1,500 mg 150 mL/hr over 120 Minutes Intravenous Every 24 hours 09/08/20 1956 09/10/20 1132   09/09/20 1000  ceFEPIme (MAXIPIME) 2 g in sodium chloride 0.9 % 100 mL IVPB  Status:  Discontinued        2 g 200 mL/hr over 30 Minutes Intravenous Every 12 hours 09/08/20 1947 09/12/20 0822   09/08/20 2200  ceFEPIme (MAXIPIME) 2 g in sodium chloride 0.9 % 100 mL IVPB        2 g 200 mL/hr over 30 Minutes  Intravenous  Once 09/08/20 1754 09/08/20 2206   09/08/20 1800  vancomycin (VANCOCIN) IVPB 1000 mg/200 mL premix  Status:  Discontinued        1,000 mg 200 mL/hr over 60 Minutes Intravenous  Once 09/08/20 1754 09/08/20 1756   09/08/20 1630  vancomycin (VANCOREADY) IVPB 2000 mg/400 mL        2,000 mg 200 mL/hr over 120 Minutes Intravenous  Once 09/08/20 1551 09/08/20 1842   09/08/20 1600  piperacillin-tazobactam (ZOSYN) IVPB 3.375 g        3.375 g 100 mL/hr over 30 Minutes Intravenous  Once 09/08/20 1549 09/08/20 1645     Objective: Vitals: Today's Vitals   09/19/20 1300 09/19/20 1305 09/19/20 1342 09/19/20 1400  BP: (!) 97/48 (!) 132/91 103/70   Pulse: (!) 107  86   Resp:  15 16   Temp:  97.8 F (36.6 C) 97.7 F (36.5 C)   TempSrc:  Oral Oral   SpO2:  96% 98%   Weight:      Height:      PainSc:  0-No pain  0-No pain    Intake/Output Summary (Last 24 hours) at 09/19/2020 1433 Last data filed at 09/19/2020 1353 Gross per 24 hour  Intake 1071.08 ml  Output 4175 ml  Net -3103.92 ml   Filed Weights   09/18/20 1352 09/18/20 1737 09/19/20 0930  Weight: 132.6 kg 129.3 kg 128.5 kg   Weight change: -2.6 kg  Intake/Output from previous day: 11/04 0701 - 11/05 0700 In: 861.1 [P.O.:420; I.V.:431.1] Out: 3400 [Urine:400] Intake/Output this shift: Total I/O In: 480 [P.O.:480] Out: 775 [Other:775]  Examination: General exam: AAOx3,NAD, weak appearing,morbidly obese. HEENT:Oral mucosa moist, Ear/Nose WNL grossly, dentition normal. Respiratory system: bilaterally diminished,no wheezing or crackles,no use of accessory muscle. Cardiovascular system: S1 & S2 +, No JVD. Gastrointestinal system: Abdomen soft, NT,ND, BS+. Nervous System:Alert, awake, moving extremities and grossly non-focal. Extremities: Bilateral extensive lower leg edema dressing/una boot in place.  Skin: No rashes,no icterus. MSK: Normal muscle bulk,tone, power. TDC+  Data Reviewed: I have personally reviewed  following labs and imaging studies CBC: Recent Labs  Lab 09/15/20 0436 09/15/20 0436 09/16/20 0456 09/17/20 0600 09/18/20 0202 09/18/20 2101 09/19/20 0407  WBC 11.8*  --  10.5 10.4 12.5*  --  9.8  NEUTROABS 8.5*  --  7.4 7.6 9.4*  --  6.6  HGB 11.4*   < > 11.6* 11.0* 10.7* 11.0* 10.5*  HCT 37.9*   < > 38.3* 36.0* 34.1* 34.8* 33.2*  MCV 90.2  --  88.7 87.4 84.0  --  86.2  PLT 326  --  320 350 343  --  304   < > = values in this interval not displayed.   Basic Metabolic Panel: Recent Labs  Lab 09/15/20 0436 09/16/20 0456 09/17/20 0600 09/18/20 0202 09/19/20 0407  NA 136 135 136 136 134*  K 4.0 4.3 4.3 3.7 3.2*  CL 105 106 105 104 98  CO2 16* 16* 16* 18* 23  GLUCOSE 187* 168* 169* 159* 126*  BUN 88* 105* 118* 85* 52*  CREATININE 4.79* 5.91* 6.79* 6.09* 5.03*  CALCIUM 8.1* 7.9* 8.1* 8.0* 7.9*  MG 1.9 2.0 2.2 1.7 1.8  PHOS 7.0* 8.0* 9.0* 7.0* 6.7*   GFR: Estimated Creatinine Clearance: 17.9 mL/min (A) (by C-G formula based on SCr of 5.03 mg/dL (H)). Liver Function Tests: No results for input(s): AST, ALT, ALKPHOS, BILITOT, PROT, ALBUMIN in the last 168 hours. No results for input(s): LIPASE, AMYLASE in the last 168 hours. No results for input(s): AMMONIA in the last 168 hours. Coagulation Profile: No results for input(s): INR, PROTIME in the last 168 hours. Cardiac Enzymes: Recent Labs  Lab 09/14/20 1222  CKTOTAL 41*   BNP (last 3 results) No results for input(s): PROBNP in the last 8760 hours. HbA1C: No results for input(s): HGBA1C in the last 72 hours. CBG: Recent Labs  Lab 09/18/20 1145 09/18/20 1818 09/18/20 2052 09/19/20 0805 09/19/20 1348  GLUCAP 140* 111* 139* 113* 118*   Lipid Profile: No results for input(s): CHOL, HDL, LDLCALC, TRIG, CHOLHDL, LDLDIRECT in the last 72 hours. Thyroid Function Tests: No results for input(s): TSH, T4TOTAL, FREET4, T3FREE, THYROIDAB in the last 72 hours. Anemia Panel: No results for input(s): VITAMINB12, FOLATE,  FERRITIN, TIBC, IRON, RETICCTPCT in the last 72 hours. Sepsis Labs: No results for input(s): PROCALCITON, LATICACIDVEN in the last 168 hours.  No results found for this or any previous visit (from the past 240 hour(s)).   Radiology Studies: ECHOCARDIOGRAM COMPLETE  Result Date: 09/18/2020    ECHOCARDIOGRAM REPORT   Patient Name:   [name] [facility]. Date of Exam: 09/18/2020 Medical Rec #:  [id number]           Height:       69.0 in Accession #:    [id number]          Weight:       292.1 lb Date of Birth:  1951-08-12            BSA:          2.427 m Patient Age:    42 years            BP:           95/84 mmHg Patient Gender: M                   HR:           96 bpm. Exam Location:  Inpatient Procedure: 2D Echo, Cardiac Doppler, Color Doppler and Intracardiac            Opacification Agent Indications:    Cardiorenal syndrome. ; I50.9* Heart failure (unspecified)  History:  Patient has prior history of Echocardiogram examinations, most                 recent 01/31/2020. Abnormal ECG, Arrythmias:Atrial Fibrillation;                 Risk Factors:Hypertension and Diabetes.  Sonographer:    Roseanna Rainbow RDCS Referring Phys: [id number] [facility]  Sonographer Comments: Image acquisition challenging due to patient body habitus. IMPRESSIONS  1. Heavy smoke in the left ventricle consistent with pre-thrombotic state, no thrombus was seen on Definity echocontrast images.  2. Left ventricular ejection fraction, by estimation, is 30 to 35%. The left ventricle has moderately decreased function. The left ventricle demonstrates global hypokinesis. There is severe concentric left ventricular hypertrophy. Left ventricular diastolic function could not be evaluated. There is the interventricular septum is flattened in systole and diastole, consistent with right ventricular pressure and volume overload.  3. Right ventricular systolic function is moderately reduced. The right ventricular size is moderately enlarged. There is mildly  elevated pulmonary artery systolic pressure. The estimated right ventricular systolic pressure is 93.8 mmHg.  4. Left atrial size was severely dilated.  5. Right atrial size was moderately dilated.  6. The mitral valve is normal in structure. Mild mitral valve regurgitation. No evidence of mitral stenosis.  7. Tricuspid valve regurgitation is moderate.  8. The aortic valve is normal in structure. Aortic valve regurgitation is not visualized. No aortic stenosis is present.  9. The inferior vena cava is normal in size with greater than 50% respiratory variability, suggesting right atrial pressure of 3 mmHg. FINDINGS  Left Ventricle: Left ventricular ejection fraction, by estimation, is 30 to 35%. The left ventricle has moderately decreased function. The left ventricle demonstrates global hypokinesis. Definity contrast agent was given IV to delineate the left ventricular endocardial borders. The left ventricular internal cavity size was normal in size. There is severe concentric left ventricular hypertrophy. Abnormal (paradoxical) septal motion, consistent with left bundle branch block and the interventricular septum is flattened in systole and diastole, consistent with right ventricular pressure and volume overload. Left ventricular diastolic function could not be evaluated due to atrial fibrillation. Left ventricular diastolic function could  not be evaluated. Right Ventricle: The right ventricular size is moderately enlarged. No increase in right ventricular wall thickness. Right ventricular systolic function is moderately reduced. There is mildly elevated pulmonary artery systolic pressure. The tricuspid regurgitant velocity is 2.49 m/s, and with an assumed right atrial pressure of 8 mmHg, the estimated right ventricular systolic pressure is 18.2 mmHg. Left Atrium: Left atrial size was severely dilated. Right Atrium: Right atrial size was moderately dilated. Pericardium: There is no evidence of pericardial  effusion. Mitral Valve: The mitral valve is normal in structure. Mild mitral valve regurgitation. No evidence of mitral valve stenosis. Tricuspid Valve: The tricuspid valve is normal in structure. Tricuspid valve regurgitation is moderate . No evidence of tricuspid stenosis. Aortic Valve: The aortic valve is normal in structure. Aortic valve regurgitation is not visualized. No aortic stenosis is present. Pulmonic Valve: The pulmonic valve was normal in structure. Pulmonic valve regurgitation is not visualized. No evidence of pulmonic stenosis. Aorta: The aortic root is normal in size and structure. Venous: The inferior vena cava is normal in size with greater than 50% respiratory variability, suggesting right atrial pressure of 3 mmHg. IAS/Shunts: No atrial level shunt detected by color flow Doppler.  LEFT VENTRICLE PLAX 2D LVIDd:         4.80 cm LVIDs:  4.30 cm LV PW:         2.20 cm LV IVS:        1.60 cm LVOT diam:     2.40 cm LV SV:         57 LV SV Index:   23 LVOT Area:     4.52 cm  LV Volumes (MOD) LV vol d, MOD A2C: 113.0 ml LV vol d, MOD A4C: 150.0 ml LV vol s, MOD A2C: 59.9 ml LV vol s, MOD A4C: 75.3 ml LV SV MOD A2C:     53.1 ml LV SV MOD A4C:     150.0 ml LV SV MOD BP:      68.9 ml RIGHT VENTRICLE            IVC RV S prime:     6.85 cm/s  IVC diam: 3.00 cm TAPSE (M-mode): 1.2 cm LEFT ATRIUM             Index       RIGHT ATRIUM           Index LA diam:        5.80 cm 2.39 cm/m  RA Area:     16.70 cm LA Vol (A2C):   64.7 ml 26.66 ml/m RA Volume:   38.90 ml  16.03 ml/m LA Vol (A4C):   87.3 ml 35.97 ml/m LA Biplane Vol: 80.0 ml 32.96 ml/m  AORTIC VALVE LVOT Vmax:   86.20 cm/s LVOT Vmean:  49.500 cm/s LVOT VTI:    0.126 m  AORTA Ao Root diam: 3.85 cm Ao Asc diam:  3.70 cm MITRAL VALVE               TRICUSPID VALVE MV Area (PHT): 4.97 cm    TR Peak grad:   24.8 mmHg MV Decel Time: 153 msec    TR Vmax:        249.00 cm/s MV E velocity: 68.43 cm/s                            SHUNTS                             Systemic VTI:  0.13 m                            Systemic Diam: 2.40 cm Ena Dawley MD Electronically signed by Ena Dawley MD Signature Date/Time: 09/18/2020/12:18:16 PM    Final      LOS: 11 days   Antonieta Pert, MD [facility]  09/19/2020, 2:33 PM

## 2020-09-19 NOTE — Progress Notes (Signed)
ANTICOAGULATION CONSULT NOTE - Follow Up Consult  Pharmacy Consult for IV heparin Indication: atrial fibrillation  Allergies  Allergen Reactions  . Codeine Swelling    Water retention  . Sulfur Other (See Comments)    Not sure of reaction a young child    Patient Measurements: Height: 5\' 9"  (175.3 cm) Weight: 129.3 kg (285 lb 0.9 oz) IBW/kg (Calculated) : 70.7 Heparin Dosing Weight: 93kg  Vital Signs: Temp: 98.4 F (36.9 C) (11/05 0434) Temp Source: Oral (11/04 2056) BP: 112/71 (11/05 0434) Pulse Rate: 108 (11/05 0434)  Labs: Recent Labs    09/16/20 1359 09/16/20 1359 09/16/20 2043 09/16/20 2043 09/17/20 0600 09/17/20 0600 09/18/20 0202 09/18/20 0202 09/18/20 2101 09/19/20 0407  HGB  --   --   --   --  11.0*   < > 10.7*   < > 11.0* 10.5*  HCT  --   --   --   --  36.0*   < > 34.1*  --  34.8* 33.2*  PLT  --   --   --   --  350  --  343  --   --  304  APTT 74*  --  96*  --  124*  --   --   --   --   --   HEPARINUNFRC 0.51   < > 0.45   < > 0.56  --  0.51  --   --  0.43  CREATININE  --   --   --   --  6.79*  --  6.09*  --   --  5.03*   < > = values in this interval not displayed.    Estimated Creatinine Clearance: 17.9 mL/min (A) (by C-G formula based on SCr of 5.03 mg/dL (H)).   Medications:  Scheduled:  . (feeding supplement) PROSource Plus  30 mL Oral QID  . amoxicillin-clavulanate  1 tablet Oral Daily  . Chlorhexidine Gluconate Cloth  6 each Topical Daily  . Chlorhexidine Gluconate Cloth  6 each Topical Q0600  . doxycycline  100 mg Oral Q12H  . insulin aspart  0-15 Units Subcutaneous TID WC  . insulin aspart  0-5 Units Subcutaneous QHS  . metoprolol tartrate  50 mg Oral BID  . midodrine  10 mg Oral TID WC    Assessment: 69yoM admitted after a fall and was found to have osteomyelitis of his feet. PMH s/f atrial fibrillation on apixaban. Patient was transitioned from apixaban to heparin for possible surgery. He declined surgery and was restarted on  apixaban.   Pharmacy was consulted for transition back to heparin for AKI and HD catheter replacement. Last dose of apixaban given 11/1, s/p HD cath placement on 11/2.  HL today = 0.43, therapeutic. Hgb 10-11, PLT WNL.  Of note, patient's foley showed hematuria on 11/3 - suspect catheter-related trauma per note. Continuing heparin for now but may need to hold if it happens again.   Goal of Therapy:  Heparin level 0.3-0.7 units/ml Monitor platelets by anticoagulation protocol: Yes   Plan:  Continue heparin 1,750 units/hr  Monitor daily heparin level and CBC Continue to closely monitor for bleeding Follow plans for heparin vs oral anticoagulation  Mercy Riding, PharmD PGY1 Acute Care Pharmacy Resident Please refer to [facility] for unit-specific pharmacist

## 2020-09-19 NOTE — Progress Notes (Signed)
[facility] Progress Note  Subjective: Patient continues to tolerate dialysis without any issues.  Having some diarrhea today.  No other complaints.  Vitals:   09/19/20 1100 09/19/20 1130 09/19/20 1200 09/19/20 1230  BP: (!) 82/51 94/68 (!) 83/49 (!) 81/48  Pulse: (!) 108 (!) 101 94 (!) 103  Resp:      Temp:      TempSrc:      SpO2:      Weight:      Height:        Exam:  GEN: Chronically ill-appearing, sitting in chair, no distress ENT: no nasal discharge, mmm EYES: no scleral icterus, eomi CV: Normal rate, extensive lower extremity edema bilaterally PULM: no iwob, bilateral chest rise ABD: NABS, distended abdomen SKIN: Bilateral venous stasis changes with discoloration of the toes, otherwise no rashes or jaundice     UA 10/31 - 100 prot, 0-5 rbc/ wbc, rare bact    UNa < 40, UCr 166.2    RUS 10/31 - 14.5/ 14.6 cm kidneys w/o hydro, normal echotexture, normal bladder appearance, limited study d/t body habitus   Summary: 69 yo hx diast CHF, DM2, afib , HTN found down in BR during welfare check, currently f/b APS. Pt reported fall in BR same day. Pt reported stopping all medications mos ago except torsemide, "didn't think I needed them anymore". In ED had afib /RVR and diabetic foot infection, started on IV vanc/ cefepime, po metoprolol started and pt admitted. MRI showed OM R foot. B/L creat is 1.3- 1.4, creat here 1.9 on admit, creatinine is continued to rise consult for acute renal failure  Assessment/ Plan: 1. AKI on CKD 3b - BL creat 1.3-1.9, eGFR 35- 51 ml/min.  Creatinine and BUN continue to rise.  Has had borderline low blood pressures and extensive volume overload.  Kidney injury likely multifactorial with cardiorenal syndrome, ATN contributing.  Started dialysis on 11/3 tolerating well.  Continue dialysis with volume removal as able.  We will try some diuresis over the weekend.  If he fails to respond may need to consider transitioning dialysis catheter to [facility]  and possibly obtain aVF.  2. CHF with diastolic and systolic component w/ decreased EF of 30 to 35% on echocardiogram.  Continues to be very volume overloaded likely to cardiorenal syndrome.  Continue volume removal with dialysis.  On midodrine.  Reinstitution of Lasix tomorrow 3. HTN - relatively hypotensive at this time. BP's 100's. On metoprolol xl 50 for afib.  Continue midodrine at current dose 4. T2DM - management per primary 5. Afib w/ RVR - continue rate control as tolerated also with hypotension 6. Sepsis w/ osteomyelitis of the 5th toe on rt foot -antibiotics per primary team     Darnell Level  09/19/2020, 1:03 PM   Recent Labs  Lab 09/18/20 0202 09/18/20 0202 09/18/20 2101 09/19/20 0407  K 3.7  --   --  3.2*  BUN 85*  --   --  52*  CREATININE 6.09*  --   --  5.03*  CALCIUM 8.0*  --   --  7.9*  PHOS 7.0*  --   --  6.7*  HGB 10.7*   < > 11.0* 10.5*   < > = values in this interval not displayed.   Inpatient medications: . (feeding supplement) PROSource Plus  30 mL Oral QID  . amoxicillin-clavulanate  1 tablet Oral Daily  . Chlorhexidine Gluconate Cloth  6 each Topical Daily  . Chlorhexidine Gluconate Cloth  6 each  Topical Q0600  . doxycycline  100 mg Oral Q12H  . heparin sodium (porcine)      . insulin aspart  0-15 Units Subcutaneous TID WC  . insulin aspart  0-5 Units Subcutaneous QHS  . metoprolol tartrate  50 mg Oral BID  . midodrine  10 mg Oral TID WC   . heparin 1,750 Units/hr (09/19/20 0757)   acetaminophen **OR** acetaminophen, HYDROmorphone (DILAUDID) injection, labetalol, lidocaine, melatonin, sodium chloride flush

## 2020-09-20 DIAGNOSIS — Z7189 Other specified counseling: Secondary | ICD-10-CM | POA: Diagnosis not present

## 2020-09-20 DIAGNOSIS — Z515 Encounter for palliative care: Secondary | ICD-10-CM | POA: Diagnosis not present

## 2020-09-20 DIAGNOSIS — M869 Osteomyelitis, unspecified: Secondary | ICD-10-CM | POA: Diagnosis not present

## 2020-09-20 LAB — GLUCOSE, CAPILLARY
Glucose-Capillary: 117 mg/dL — ABNORMAL HIGH (ref 70–99)
Glucose-Capillary: 139 mg/dL — ABNORMAL HIGH (ref 70–99)
Glucose-Capillary: 121 mg/dL — ABNORMAL HIGH (ref 70–99)
Glucose-Capillary: 110 mg/dL — ABNORMAL HIGH (ref 70–99)

## 2020-09-20 LAB — CBC WITH DIFFERENTIAL/PLATELET
Abs Immature Granulocytes: 0.11 10*3/uL — ABNORMAL HIGH (ref 0.00–0.07)
Basophils Absolute: 0.1 10*3/uL (ref 0.0–0.1)
Basophils Relative: 1 %
Eosinophils Absolute: 0.1 10*3/uL (ref 0.0–0.5)
Eosinophils Relative: 1 %
HCT: 36.3 % — ABNORMAL LOW (ref 39.0–52.0)
Hemoglobin: 11.3 g/dL — ABNORMAL LOW (ref 13.0–17.0)
Immature Granulocytes: 1 %
Lymphocytes Relative: 18 %
Lymphs Abs: 1.9 10*3/uL (ref 0.7–4.0)
MCH: 26.8 pg (ref 26.0–34.0)
MCHC: 31.1 g/dL (ref 30.0–36.0)
MCV: 86.2 fL (ref 80.0–100.0)
Monocytes Absolute: 1.2 10*3/uL — ABNORMAL HIGH (ref 0.1–1.0)
Monocytes Relative: 12 %
Neutro Abs: 7.1 10*3/uL (ref 1.7–7.7)
Neutrophils Relative %: 67 %
Platelets: 348 10*3/uL (ref 150–400)
RBC: 4.21 MIL/uL — ABNORMAL LOW (ref 4.22–5.81)
RDW: 15.1 % (ref 11.5–15.5)
WBC: 10.5 10*3/uL (ref 4.0–10.5)
nRBC: 0 % (ref 0.0–0.2)

## 2020-09-20 LAB — MAGNESIUM: Magnesium: 1.8 mg/dL (ref 1.7–2.4)

## 2020-09-20 LAB — BASIC METABOLIC PANEL
Anion gap: 11 (ref 5–15)
BUN: 39 mg/dL — ABNORMAL HIGH (ref 8–23)
CO2: 24 mmol/L (ref 22–32)
Calcium: 8.3 mg/dL — ABNORMAL LOW (ref 8.9–10.3)
Chloride: 99 mmol/L (ref 98–111)
Creatinine, Ser: 4.79 mg/dL — ABNORMAL HIGH (ref 0.61–1.24)
GFR, Estimated: 12 mL/min — ABNORMAL LOW (ref >60)
Glucose, Bld: 130 mg/dL — ABNORMAL HIGH (ref 70–99)
Potassium: 3.5 mmol/L (ref 3.5–5.1)
Sodium: 134 mmol/L — ABNORMAL LOW (ref 135–145)

## 2020-09-20 LAB — HEPARIN LEVEL (UNFRACTIONATED): Heparin Unfractionated: 0.35 IU/mL (ref 0.30–0.70)

## 2020-09-20 LAB — PHOSPHORUS: Phosphorus: 5.7 mg/dL — ABNORMAL HIGH (ref 2.5–4.6)

## 2020-09-20 MED ORDER — METOPROLOL TARTRATE 25 MG PO TABS
25.0000 mg | ORAL_TABLET | Freq: Two times a day (BID) | ORAL | Status: DC
  Administered 2020-09-22 (×2): 25 mg via ORAL
  Filled 2020-09-20 (×5): qty 1

## 2020-09-20 NOTE — Consult Note (Signed)
WOC Nurse Consult Note: WOC nursing requested today to consult for patient's toenails and toe wounds.  WOC Nursing has seen the patient on 4 occasions since admission and is currently following a POC established by Dr. Sharol Given on 10/26 by applying a multilayer compression bandaging system (Profore) on Wednesdays. Next dressing change is on Wednesday, November 10.   The note from Dr Sharol Given on 10/26 indicate that the patient declined Dr. Jess Barters recommendation that he undergo a ray amputation. Dr. Sharol Given agreed to follow this patient in the community post discharge.  I communicated with Dr, Lupita Leash via [place] today to let him know that toenail debridement was outside the scope of [facility] and recommended that he either reconsult Dr. Sharol Given for further recommendations of consult with Podiatric Medicine.  [facility] nursing team will continue to follow for weekly Profore multilayer compression bandaging system applications on Wednesdays. Maudie Flakes, MSN, RN, [place], [facility]  Pager# [phone]

## 2020-09-20 NOTE — Progress Notes (Signed)
PROGRESS NOTE    [place] Neighbors.  NIO:[id number] DOB: 1951-07-21 DOA: 09/08/2020 PCP: Aura Dials, MD   Chief Complaint  Patient presents with  . Wound Infection    Brief Narrative:  69 year old male with G1WE, chronic systolic and diastolic CHF, PAF supposed to be on Eliquis, HTN, morbid obesity brought to ED 09/08/2020 after he was found down in his house.  Reportedly patient was followed.  Protective services, has very unsanitary living condition at home and was found down in the bathroom during welfare check.  Per report: Patient reports falling while standing up from bathroom without LOC, resulting in fall onto R chest and resultant R lower chest pain. Patient was found down and brought into ED.  Of note, patient reports stopping all medications several months ago with exception of torsemide because "I didn't think I needed them anymore." Pt has only been taking torsemide PRN swelling.  He was found to have necrotic foot infections and underwent xray of right foot, which revealed subcortical lucency involving 5th digit concerning for OM.  He was started on vancomycin and cefepime and orthopedic surgery was consulted. ABIs were also ordered. Test was inconclusive due to severe lower extremity edema.   Initially patient was also against surgical treatment options despite severity of his wounds, their obvious chronicity, and inability to explain consequences for avoiding surgery. We requested psychiatry to evaluate patient for decision making ability in this setting. After repeat explanation of his diagnosis and possible treatment options he was able to voice adequate understanding to psychiatry and was considered to have decision making capacity regarding his current infection.   He will need to continue with serial compression wrappings at discharge in efforts to promote better chance of wound healing and proper assessment of ABIs to help decide best treatment course.    Given his significant deconditioning and after discussion with his daughter, there is concern for his ability to safely return home due to poor living situation; Physical therapy was consulted. After PT evaluation he was recommended for SNF at discharge. He was accepting of this and placement was pursued.  Overall, he had good clinical response to IV antibiotics. He was deescalated to Augmentin and doxycycline to finish out a course. He will continue with serial compression wrappings at discharge to rehab as well as following up with Ortho for further management.  During hospitalization he was started on IV Lasix for diuresis given his severe lower extremity edema. His creatinine continued to slowly uptrend. Urine output measuring was inaccurate and difficult to gauge response. Nephrology was consulted on 09/14/2020 given his uptrending creatinine in setting of multiple antibiotics initially and some transient hypotension.  Renal ultrasound showed no hydronephrosis.  He underwent temporary HD catheter placement by IR on 09/16/2020 and nephrology recommended transfer to [facility] for possible short-term hemodialysis. Noncompliant in general and very self-neglected with denial component. Admitted with horrendous feet/legs. Has osteo in R foot 5th digit. Very well possibly in left foot too chronically.  Too edematous for good ABI's per ortho so getting serial compression wrapping with plans for outpt follow up with ortho (on augmentin/doxy now for OM tx).  Patient was transferred to [facility] 11/3 and dialysis was started.  Subjective: Seen and examined, daughter at the bedside.  Patient offers no new complaints. Overnight T-max 99.1, intermittent episodes of hypotension labile BP 80s to 160s. On room air. BUN 39 creatinine 5.7 this morning.  Electrolytes stable. Urine output in Foley catheter 100 ml-hematuria present  Assessment & Plan:  AKI on CKD stage III with baseline  creatinine 1.2-1.3. seen by Nephrology has undergone extensive work-up -, temp HD cath by IR 12/2 and started on dialysis 11/3, dialysis again 11/4,11/5. AKI is multifactorial with cardiorenal syndrome, ATN contributing 2/2 sepsis. continue dialysis per nephrology.  UOP only 100 ml in foley.starting Lasix on 120 mg tid 11/6, continue midodrine.  If he fails to respond  May need to consider transitioning to dialysis catheter to [facility] and possible left aVF. Recent Labs  Lab 09/16/20 0456 09/17/20 0600 09/18/20 0202 09/19/20 0407 09/20/20 0503  BUN 105* 118* 85* 52* 39*  CREATININE 5.91* 6.79* 6.09* 5.03* 4.79*   Acute on chronic combined systolic and diastolic CHF: LVEF 27-74% in LVEF, previously back in March EF was 40-45%: TTE shows lvef 30-35%, severe concentric LVH, global hypokinesia.  Adjust fluid management with dialysis. Home Lasix on hold due to AKI-but unsure about his compliance.   Hypotension: Blood pressure labile continue midodrine.    Chronic lymphedema followed by wound care. Continue Unna boot and dressing.  Continue wound care  Falls at home/neuropathy: cont pt/ot  Hypokalemia 3.2 adjusted with dialysis per nephrology  Severe sepsis JOI:NOMVEH is resolved.  Osteomyelitis of fifth toe of right foot, osteomyelitis per x-ray, ABI indeterminate due to lower extremity edema.  Serial compression to aid with improving edema, orthopedics had evaluated and need outpatient follow-up.  Initially on IV antibiotics which have been switched to oral Augmentin and doxycycline-with plan to continue for 2 weeks.  Acute metabolic encephalopathy found down at home secondary to uremia mental status is stable.  He ii  alert and oriented x3, conversant, interactive.  PAF with MCN:[id number] with meds, intolerant tachycardic, continue metoprolol-given low blood pressure continue 25 twice daily with holding parameters. Eliquis is on hold due to AKI and currently on heparin.  Switch to Eliquis when  safe from renal standpoint  Hematuria after placing foley catheter suspect catheter related trauma:monitor closely on heparin if ongoing/not resolving will need to hold heparin.does not have much urine output.  Hemoglobin uptrending/stable. Recent Labs  Lab 09/17/20 0600 09/18/20 0202 09/18/20 2101 09/19/20 0407 09/20/20 0503  HGB 11.0* 10.7* 11.0* 10.5* 11.3*  HCT 36.0* 34.1* 34.8* 33.2* 36.3*   Type 2 diabetes mellitus: A1c 9.6 10/25.  Blood sugar is stable continue sliding scale. Recent Labs  Lab 09/19/20 0805 09/19/20 1348 09/19/20 1706 09/19/20 2040 09/20/20 0636  GLUCAP 113* 118* 160* 124* 117*   Morbid obesity with BMI 43>41, will benefit with weight loss/healthy lifestyle and outpatient sleep apnea evaluation.    UTM:LYYTK with his daughter [name] [employer] copy)- Overall prognosis remains to be seen she is leaning towards palliative care if needed. Requesting palliative consultation.  She is coming to see to complete healthcare power of attorney at some point.  They are trying to make arrangement at home for his return renovating his house floor change or even arrange for wheelchair so that patient can be brought home ( even w/ hospice) She understands that currently he is medically needing dialysis and may need long-term.  Will request palliative care consultation to aid IN goc DISCUSSIONS.  Wound care: On weekly dressing on Wednesday- on lower extremities, advised nursing wound care team Toenail care unable to drain currently.  Not sure if we will be successful in getting podiatry in-house to trim the nail. Nutrition: Diet Order            Diet Carb Modified Fluid consistency: Thin; Room service appropriate? Yes  Diet effective now                 Nutrition Problem: Increased nutrient needs Etiology: wound healing, acute illness Signs/Symptoms: estimated needs Interventions: MVI, Other (Comment) (Prosource Plus)  Body mass index is 41.83 kg/m.  Pressure Ulcer: Pressure  Injury 09/08/20 Heel Right Stage 3 -  Full thickness tissue loss. Subcutaneous fat may be visible but bone, tendon or muscle are NOT exposed. 25% red, 75% slouogh, strong odor, mod brown (Active)  09/08/20   Location: Heel  Location Orientation: Right  Staging: Stage 3 -  Full thickness tissue loss. Subcutaneous fat may be visible but bone, tendon or muscle are NOT exposed.  Wound Description (Comments): 25% red, 75% slouogh, strong odor, mod brown  Present on Admission: Yes     Pressure Injury 09/17/20 (Active)  09/17/20 1805  Location:   Location Orientation:   Staging:   Wound Description (Comments):   Present on Admission:    DVT prophylaxis: heparin gtt Code Status:  Code Status: Full Code  Family Communication: plan of care discussed with patient at bedside.Discussed with patient's daughter 11/4 who was visiting from [place]. I called his daughter [name].  Status LH:[id number] Remains inpatient appropriate because:IV treatments appropriate due to intensity of illness or inability to take PO and Inpatient level of care appropriate due to severity of illness  Dispo:The patient is from: Home            Anticipated d/c is to: SNF            Anticipated d/c date is: > 3 days            Patient currently is not medically stable to d/c. Consultants:see note  Procedures:see note  Culture/Microbiology    Component Value Date/Time   SDES  09/08/2020 2103    BLOOD LEFT HAND Performed at [facility], [place] [address], [place], [place] [address]    SPECREQUEST  09/08/2020 2103    BOTTLES DRAWN AEROBIC AND ANAEROBIC Blood Culture adequate volume Performed at [facility] [address] [place], [place] [address]    CULT  09/08/2020 2103    NO GROWTH 5 DAYS Performed at [facility] [address] [place], [place] [address]    REPTSTATUS 09/13/2020 FINAL 09/08/2020 2103    Other culture-see note  Medications: Scheduled Meds: .  (feeding supplement) PROSource Plus  30 mL Oral QID  . amoxicillin-clavulanate  1 tablet Oral Daily  . Chlorhexidine Gluconate Cloth  6 each Topical Daily  . Chlorhexidine Gluconate Cloth  6 each Topical Q0600  . doxycycline  100 mg Oral Q12H  . insulin aspart  0-15 Units Subcutaneous TID WC  . insulin aspart  0-5 Units Subcutaneous QHS  . metoprolol tartrate  25 mg Oral BID  . midodrine  10 mg Oral TID WC   Continuous Infusions: . furosemide 120 mg (09/20/20 0615)  . heparin 1,750 Units/hr (09/19/20 2159)    Antimicrobials: Anti-infectives (From admission, onward)   Start     Dose/Rate Route Frequency Ordered Stop   09/17/20 1000  amoxicillin-clavulanate (AUGMENTIN) 500-125 MG per tablet 500 mg        1 tablet Oral Daily 09/17/20 0839     09/14/20 2200  amoxicillin-clavulanate (AUGMENTIN) 500-125 MG per tablet 500 mg  Status:  Discontinued        1 tablet Oral 2 times daily 09/14/20 1524 09/17/20 0839   09/12/20 1000  amoxicillin-clavulanate (AUGMENTIN) 875-125 MG per  tablet 1 tablet  Status:  Discontinued        1 tablet Oral Every 12 hours 09/12/20 0823 09/14/20 1524   09/12/20 1000  doxycycline (VIBRA-TABS) tablet 100 mg        100 mg Oral Every 12 hours 09/12/20 0823     09/10/20 2000  DAPTOmycin (CUBICIN) 740 mg in sodium chloride 0.9 % IVPB  Status:  Discontinued        740 mg 229.6 mL/hr over 30 Minutes Intravenous Daily 09/10/20 1230 09/12/20 0822   09/10/20 1200  metroNIDAZOLE (FLAGYL) IVPB 500 mg  Status:  Discontinued        500 mg 100 mL/hr over 60 Minutes Intravenous Every 8 hours 09/10/20 1050 09/12/20 0822   09/10/20 1132  vancomycin variable dose per unstable renal function (pharmacist dosing)  Status:  Discontinued         Does not apply See admin instructions 09/10/20 1132 09/10/20 1230   09/09/20 1700  vancomycin (VANCOREADY) IVPB 1500 mg/300 mL  Status:  Discontinued        1,500 mg 150 mL/hr over 120 Minutes Intravenous Every 24 hours 09/08/20 1956 09/10/20  1132   09/09/20 1000  ceFEPIme (MAXIPIME) 2 g in sodium chloride 0.9 % 100 mL IVPB  Status:  Discontinued        2 g 200 mL/hr over 30 Minutes Intravenous Every 12 hours 09/08/20 1947 09/12/20 0822   09/08/20 2200  ceFEPIme (MAXIPIME) 2 g in sodium chloride 0.9 % 100 mL IVPB        2 g 200 mL/hr over 30 Minutes Intravenous  Once 09/08/20 1754 09/08/20 2206   09/08/20 1800  vancomycin (VANCOCIN) IVPB 1000 mg/200 mL premix  Status:  Discontinued        1,000 mg 200 mL/hr over 60 Minutes Intravenous  Once 09/08/20 1754 09/08/20 1756   09/08/20 1630  vancomycin (VANCOREADY) IVPB 2000 mg/400 mL        2,000 mg 200 mL/hr over 120 Minutes Intravenous  Once 09/08/20 1551 09/08/20 1842   09/08/20 1600  piperacillin-tazobactam (ZOSYN) IVPB 3.375 g        3.375 g 100 mL/hr over 30 Minutes Intravenous  Once 09/08/20 1549 09/08/20 1645     Objective: Vitals: Today's Vitals   09/19/20 2046 09/19/20 2224 09/20/20 0503 09/20/20 0854  BP: (!) 166/124  98/69 92/60  Pulse: 86  (!) 53 94  Resp:    16  Temp: 99.1 F (37.3 C)  98.9 F (37.2 C) 97.8 F (36.6 C)  TempSrc: Oral  Oral Oral  SpO2: 93%  99% 97%  Weight: 128.5 kg     Height:      PainSc:  Asleep  2     Intake/Output Summary (Last 24 hours) at 09/20/2020 1124 Last data filed at 09/20/2020 0855 Gross per 24 hour  Intake 1237.77 ml  Output 875 ml  Net 362.77 ml   Filed Weights   09/18/20 1737 09/19/20 0930 09/19/20 2046  Weight: 129.3 kg 128.5 kg 128.5 kg   Weight change: -4.1 kg  Intake/Output from previous day: 11/05 0701 - 11/06 0700 In: 1237.8 [P.O.:780; I.V.:407.8; IV Piggyback:50] Out: 277 [Urine:100] Intake/Output this shift: Total I/O In: 240 [P.O.:240] Out: -   Examination: General exam: AAO , NAD, weak appearing. HEENT:Oral mucosa moist, Ear/Nose WNL grossly, dentition normal. Respiratory system: bilaterally diminished,no wheezing or crackles,no use of accessory muscle Cardiovascular system: S1 & S2 +, No  JVD,. Gastrointestinal system: Abdomen soft, NT,ND, BS+ Nervous System:Alert,  awake, moving extremities and grossly nonfocal Extremities: No edema, distal peripheral pulses palpable.  Skin: No rashes,no icterus. MSK: Normal muscle bulk,tone, power TDC+  Data Reviewed: I have personally reviewed following labs and imaging studies CBC: Recent Labs  Lab 09/16/20 0456 09/16/20 0456 09/17/20 0600 09/18/20 0202 09/18/20 2101 09/19/20 0407 09/20/20 0503  WBC 10.5  --  10.4 12.5*  --  9.8 10.5  NEUTROABS 7.4  --  7.6 9.4*  --  6.6 7.1  HGB 11.6*   < > 11.0* 10.7* 11.0* 10.5* 11.3*  HCT 38.3*   < > 36.0* 34.1* 34.8* 33.2* 36.3*  MCV 88.7  --  87.4 84.0  --  86.2 86.2  PLT 320  --  350 343  --  304 348   < > = values in this interval not displayed.   Basic Metabolic Panel: Recent Labs  Lab 09/16/20 0456 09/17/20 0600 09/18/20 0202 09/19/20 0407 09/20/20 0503  NA 135 136 136 134* 134*  K 4.3 4.3 3.7 3.2* 3.5  CL 106 105 104 98 99  CO2 16* 16* 18* 23 24  GLUCOSE 168* 169* 159* 126* 130*  BUN 105* 118* 85* 52* 39*  CREATININE 5.91* 6.79* 6.09* 5.03* 4.79*  CALCIUM 7.9* 8.1* 8.0* 7.9* 8.3*  MG 2.0 2.2 1.7 1.8 1.8  PHOS 8.0* 9.0* 7.0* 6.7* 5.7*   GFR: Estimated Creatinine Clearance: 18.8 mL/min (A) (by C-G formula based on SCr of 4.79 mg/dL (H)). Liver Function Tests: No results for input(s): AST, ALT, ALKPHOS, BILITOT, PROT, ALBUMIN in the last 168 hours. No results for input(s): LIPASE, AMYLASE in the last 168 hours. No results for input(s): AMMONIA in the last 168 hours. Coagulation Profile: No results for input(s): INR, PROTIME in the last 168 hours. Cardiac Enzymes: Recent Labs  Lab 09/14/20 1222  CKTOTAL 41*   BNP (last 3 results) No results for input(s): PROBNP in the last 8760 hours. HbA1C: No results for input(s): HGBA1C in the last 72 hours. CBG: Recent Labs  Lab 09/19/20 0805 09/19/20 1348 09/19/20 1706 09/19/20 2040 09/20/20 0636  GLUCAP 113* 118*  160* 124* 117*   Lipid Profile: No results for input(s): CHOL, HDL, LDLCALC, TRIG, CHOLHDL, LDLDIRECT in the last 72 hours. Thyroid Function Tests: No results for input(s): TSH, T4TOTAL, FREET4, T3FREE, THYROIDAB in the last 72 hours. Anemia Panel: No results for input(s): VITAMINB12, FOLATE, FERRITIN, TIBC, IRON, RETICCTPCT in the last 72 hours. Sepsis Labs: No results for input(s): PROCALCITON, LATICACIDVEN in the last 168 hours.  No results found for this or any previous visit (from the past 240 hour(s)).   Radiology Studies: No results found.   LOS: 12 days   Antonieta Pert, MD [facility]  09/20/2020, 11:24 AM

## 2020-09-20 NOTE — Progress Notes (Signed)
ANTICOAGULATION CONSULT NOTE - Follow Up Consult  Pharmacy Consult for Heparin Indication: atrial fibrillation  Allergies  Allergen Reactions  . Codeine Swelling    Water retention  . Sulfur Other (See Comments)    Not sure of reaction a young child    Patient Measurements: Height: 5\' 9"  (175.3 cm) Weight: 128.5 kg (283 lb 4.7 oz) IBW/kg (Calculated) : 70.7 Heparin Dosing Weight: 93 kg  Vital Signs: Temp: 97.8 F (36.6 C) (11/06 0854) Temp Source: Oral (11/06 0854) BP: 92/60 (11/06 0854) Pulse Rate: 94 (11/06 0854)  Labs: Recent Labs    09/18/20 0202 09/18/20 0202 09/18/20 2101 09/18/20 2101 09/19/20 0407 09/20/20 0503  HGB 10.7*   < > 11.0*   < > 10.5* 11.3*  HCT 34.1*   < > 34.8*  --  33.2* 36.3*  PLT 343  --   --   --  304 348  HEPARINUNFRC 0.51  --   --   --  0.43 0.35  CREATININE 6.09*  --   --   --  5.03* 4.79*   < > = values in this interval not displayed.    Estimated Creatinine Clearance: 18.8 mL/min (A) (by C-G formula based on SCr of 4.79 mg/dL (H)).  Assessment: 69yoM admitted after a fall and was found to have osteomyelitis of his feet. PMH s/f atrial fibrillation on apixaban. Patient was transitioned from apixaban to heparin for possible surgery. He declined surgery and was restarted on apixaban 09/10/20.  Pharmacy was consulted for transition back to IV heparin on 11/1 for AKI and HD catheter replacement. Last dose of apixaban given 11/1, s/p HD cath placement on 11/2.   Heparin level remains therapeutic (0.35) on 1750 units/hr. CBC stable.   Prior hematuria suspected catheter-related trauma per note. Continuing heparin for now but may need to hold if it happens again.   Goal of Therapy:  Heparin level 0.3-0.7 units/ml Monitor platelets by anticoagulation protocol: Yes   Plan:  Continue heparin drip at 1750 units/hr Daily heparin level and CBC Monitor for hematuria, other bleeding. Follow up for oral anticoagulation when able.   Arty Baumgartner, [employer] Phone: [phone] 09/20/2020,9:37 AM

## 2020-09-20 NOTE — Consult Note (Addendum)
Palliative Medicine Inpatient Consult Note  Reason for consult:  Goals of Care  HPI:  Per intake H&P --> 69 year old male with Z6OQ, chronic systolic and diastolic CHF, PAF supposed to be on Eliquis, HTN, morbid obesity brought to ED 09/08/2020 after he was found down in his house.  Palliative care was asked to get involved to introduce concepts of service in the setting of multiple comorbid conditions. Patient newly initiated on hemodialysis.   Clinical Assessment/Goals of Care: I have reviewed medical records including EPIC notes, labs and imaging, received report from bedside RN, assessed the patient.    I met with [name] to further discuss diagnosis prognosis, GOC, EOL wishes, disposition and options.   I introduced Palliative Medicine as specialized medical care for people living with serious illness. It focuses on providing relief from the symptoms and stress of a serious illness. The goal is to improve quality of life for both the patient and the family.  I asked "[name]" to tell me about himself. He shares that he is from [place], [place]. He has lived here his whole life. He is married to [name] - they have been together for the past 62 one years. He has three children two daughter, [name] and [name], and one son, [name]. He use to work as a Scientist, product/process development and also as a [employer]. For fun he use to drag race and play sports. He jokes that as you get older the only sport left to play is softball. He does not ascribe to any particular religious denomination though he does share that he is [place].   In regard to functionality - prior to hospitalization [name] was able to perform all bADL's and iADL's independently. He states that from a mobility stance he needs to use a cane for long distances.   A detailed discussion was had today regarding advanced directives, [name] has never completed these before. I reviewed a blank advance directive form with him.  He plans to complete this and our chaplain team will be able to notarize it on Monday.    Concepts specific to code status, artifical feeding and hydration, continued IV antibiotics and rehospitalization was had. I completed a MOST form today. The patient and family outlined their wishes for the following treatment decisions:  Cardiopulmonary Resuscitation: Attempt Resuscitation (CPR)  Medical Interventions: Full Scope of Treatment: Use intubation, advanced airway interventions, mechanical ventilation, cardioversion as indicated, medical treatment, IV fluids, etc, also provide comfort measures. Transfer to the hospital if indicated - Would not want prolonged intubation (LTACH)  Antibiotics: Antibiotics if indicated  IV Fluids: IV fluids if indicated  Feeding Tube: No feeding tube   Discussed the importance of continued conversation with family and their  medical providers regarding overall plan of care and treatment options, ensuring decisions are within the context of the patients values and GOCs.  Of note - have tried to call patients daughter, [name] to provide an update. Reached VM.  __________________________________________________________________________________ Addendum: Spoke to patients daughter, [name] via telephone. [name] discussed how that patient has been having trouble for sometime now. She shares that his short term memory is poor. He has been caring for his wife who suffers from vision loss. We discussed how they will both now require a higher level of assistance. Patient has been living in poor home condition for sometime though he does not wish to leave this. [name] is making arrangements to replace flooring in his home, get him a scooter, and provide a personal  caregiver. We discussed his OM and the significance it will have in his overall day to day function. We discussed how he would not wish for amputation therefore there is a real possibility of sepsis moving forward. [name] is aware of  all of this as she is a nurse herself.  The topic of hospice was introduced. I described hospice as a service for patients for have a life expectancy of < 6 months. It preserves dignity and quality at the end phases of life. The focus changes from curative to symptom relief. We discussed that this may be the best option for the patient to be in his home and enjoy the time he has left. I shared that patients enrolled in hospice stop HD. [name] vocalizes understanding of this. We then discussed advance care planning which [name] plans to complete when she visits her dad as well as a MOST form. These have been left at bedside for review and completion.    Decision Maker: Patient is able to make decisions for himself.  SUMMARY OF RECOMMENDATIONS   Full Code - Would not wish for prolonged ventilation; Would be open to long term HD if needed --> Ongoing conversations with patient and his daughter regarding this  MOST to be completed   TOC - [facility]erral to [facility] - leaning towards hospice  Appreciate Chaplain to help with advance directives  Code Status/Advance Care Planning: FULL CODE   Palliative Prophylaxis:   Oral Care, Mobility  Additional Recommendations (Limitations, Scope, Preferences):  Continue current scope of care  Psycho-social/Spiritual:   Desire for further Chaplaincy support: No  Additional Recommendations: Education on Palliative care and advance care planning   Prognosis: Unclear. One admission in six months, (+) multiple co-morbidities though a functional baseline. Excellent PO's.  Discharge Planning: [facility]charge [facility] will depend on   Oral Intake %:  100% I/O:  Not well documented though seems to be in taking well - on HD Bowel Movements:  (+) 1 this morning Mobility: PT eval pending  Vitals:   09/19/20 2046 09/20/20 0503  BP: (!) 166/124 98/69  Pulse: 86 (!) 53  Resp:    Temp: 99.1 F (37.3 C) 98.9 F (37.2 C)  SpO2: 93% 99%     Intake/Output Summary (Last 24 hours) at 09/20/2020 0659 Last data filed at 09/20/2020 0615 Gross per 24 hour  Intake 1237.77 ml  Output 875 ml  Net 362.77 ml   Last Weight  Most recent update: 09/20/2020  5:12 AM   Weight  128.5 kg (283 lb 4.7 oz)           Gen:  NAD HEENT: moist mucous membranes CV: Regular rate and rhythm, no murmurs rubs or gallops PULM: clear to auscultation bilaterally. No wheezes/rales/rhonchi  ABD: soft/nontender/nondistended/normal bowel sounds  EXT: No edema  Neuro: Alert and oriented x4  PPS: 80%   This conversation/these recommendations were discussed with patient primary care team.  Time In: 0800 Time Out: 0910 Total Time: 70 Greater than 50%  of this time was spent counseling and coordinating care related to the above assessment and plan.  [facility] Team Team Cell Phone: [phone] Please utilize secure chat with additional questions, if there is no response within 30 minutes please call the above phone number  Palliative Medicine Team providers are available by phone from 7am to 7pm daily and can be reached through the team cell phone.  Should this patient require assistance outside of these hours, please  call the patient's attending physician.

## 2020-09-20 NOTE — Progress Notes (Signed)
[facility] Progress Note  Subjective: Patient states he does not remember much from dialysis.  Does appear that he had some low blood pressures limiting ultrafiltration yesterday. Vitals:   09/19/20 1846 09/19/20 2046 09/20/20 0503 09/20/20 0854  BP: (!) 85/66 (!) 166/124 98/69 92/60  Pulse: 95 86 (!) 53 94  Resp: 16   16  Temp: 98.5 F (36.9 C) 99.1 F (37.3 C) 98.9 F (37.2 C) 97.8 F (36.6 C)  TempSrc: Oral Oral Oral Oral  SpO2: 96% 93% 99% 97%  Weight:  128.5 kg    Height:        Exam:  GEN: Chronically ill-appearing, sitting in chair, no distress ENT: no nasal discharge, mmm EYES: no scleral icterus, eomi CV: Normal rate, extensive lower extremity edema bilaterally PULM: no iwob, bilateral chest rise ABD: NABS, distended abdomen SKIN: Bilateral venous stasis changes with discoloration of the toes, otherwise no rashes or jaundice     UA 10/31 - 100 prot, 0-5 rbc/ wbc, rare bact    UNa < 40, UCr 166.2    RUS 10/31 - 14.5/ 14.6 cm kidneys w/o hydro, normal echotexture, normal bladder appearance, limited study d/t body habitus   Summary: 69 yo hx diast CHF, DM2, afib , HTN found down in [place] during welfare check, currently f/b APS. Pt reported fall in [place] same day. Pt reported stopping all medications mos ago except torsemide, "didn't think I needed them anymore". In ED had afib /RVR and diabetic foot infection, started on IV vanc/ cefepime, po metoprolol started and pt admitted. MRI showed OM R foot. B/L creat is 1.3- 1.4, creat here 1.9 on admit, creatinine is continued to rise consult for acute renal failure  Assessment/ Plan: 1. AKI on CKD 3b - BL creat 1.3-1.9, eGFR 35- 51 ml/min.  Creatinine and BUN continue to rise.  Has had borderline low blood pressures and extensive volume overload.  Kidney injury likely multifactorial with cardiorenal syndrome, ATN contributing.  Started dialysis on 11/3 tolerated the first 2 sessions with excellent ultrafiltration.   Yesterday ultrafiltration limited by hypotension.  Holding dialysis over the weekend and trialing IV Lasix 120 mg 3 times daily.  Will assess response.  If he does not respond may need to get tunneled dialysis catheter and AV fistula  2. CHF with diastolic and systolic component w/ decreased EF of 30 to 35% on echocardiogram.  Continues to be very volume overloaded likely to cardiorenal syndrome.  Continue volume removal with dialysis.  On midodrine.  Started Lasix 120 mg 3 times daily today 3. HTN - relatively hypotensive at this time. BP's 100's. On metoprolol xl 50 for afib.  Continue midodrine at current dose 4. T2DM - management per primary 5. Afib w/ RVR - continue rate control as tolerated also with hypotension 6. Sepsis w/ osteomyelitis of the 5th toe on rt foot -antibiotics per primary team     [name]  09/20/2020, 10:15 AM   Recent Labs  Lab 09/19/20 0407 09/20/20 0503  K 3.2* 3.5  BUN 52* 39*  CREATININE 5.03* 4.79*  CALCIUM 7.9* 8.3*  PHOS 6.7* 5.7*  HGB 10.5* 11.3*   Inpatient medications: . (feeding supplement) PROSource Plus  30 mL Oral QID  . amoxicillin-clavulanate  1 tablet Oral Daily  . Chlorhexidine Gluconate Cloth  6 each Topical Daily  . Chlorhexidine Gluconate Cloth  6 each Topical Q0600  . doxycycline  100 mg Oral Q12H  . insulin aspart  0-15 Units Subcutaneous TID WC  .  insulin aspart  0-5 Units Subcutaneous QHS  . metoprolol tartrate  50 mg Oral BID  . midodrine  10 mg Oral TID WC   . furosemide 120 mg (09/20/20 0615)  . heparin 1,750 Units/hr (09/19/20 2159)   acetaminophen **OR** acetaminophen, HYDROmorphone (DILAUDID) injection, labetalol, lidocaine, melatonin, sodium chloride flush

## 2020-09-20 NOTE — Care Management (Signed)
Referral to Outpatient Palliative with [facility].  Per Venia Carbon, will follow for palliative or hospice needs.

## 2020-09-20 NOTE — Progress Notes (Signed)
Manufacturing engineer [facility])  Request received to speak with family about hospice vs palliative services in the community.  Spoke with [name] to offer support and explain hospice.   [name] states that an amputation is not an option for them. She is not sure about permanent HD once he is ready to leave. She feels he would not want to live like this but currently pt is stating that he would want to.  [name] is not sure which path to take. Advised her that he would be hospice eligible if he does not pursue HD. Advised her if he does continue with HD, then we can assist them in the community with our palliative services and switch to hospice when the family is ready.  Information provided to [name].  She requests we reach back out on Tuesday to see if there is any clarity regarding decision making at that time.  Please reach out if we can assist further prior to Tuesday.  Venia Carbon RN, BSN, [facility] Liaison

## 2020-09-21 DIAGNOSIS — Z7189 Other specified counseling: Secondary | ICD-10-CM | POA: Diagnosis not present

## 2020-09-21 DIAGNOSIS — N189 Chronic kidney disease, unspecified: Secondary | ICD-10-CM

## 2020-09-21 DIAGNOSIS — N170 Acute kidney failure with tubular necrosis: Secondary | ICD-10-CM | POA: Diagnosis not present

## 2020-09-21 DIAGNOSIS — Z515 Encounter for palliative care: Secondary | ICD-10-CM | POA: Diagnosis not present

## 2020-09-21 DIAGNOSIS — M869 Osteomyelitis, unspecified: Secondary | ICD-10-CM | POA: Diagnosis not present

## 2020-09-21 LAB — CBC WITH DIFFERENTIAL/PLATELET
Abs Immature Granulocytes: 0.08 10*3/uL — ABNORMAL HIGH (ref 0.00–0.07)
Basophils Absolute: 0.1 10*3/uL (ref 0.0–0.1)
Basophils Relative: 1 %
Eosinophils Absolute: 0.1 10*3/uL (ref 0.0–0.5)
Eosinophils Relative: 1 %
HCT: 34 % — ABNORMAL LOW (ref 39.0–52.0)
Hemoglobin: 10.6 g/dL — ABNORMAL LOW (ref 13.0–17.0)
Immature Granulocytes: 1 %
Lymphocytes Relative: 9 %
Lymphs Abs: 0.7 10*3/uL (ref 0.7–4.0)
MCH: 26.7 pg (ref 26.0–34.0)
MCHC: 31.2 g/dL (ref 30.0–36.0)
MCV: 85.6 fL (ref 80.0–100.0)
Monocytes Absolute: 1.1 10*3/uL — ABNORMAL HIGH (ref 0.1–1.0)
Monocytes Relative: 13 %
Neutro Abs: 6.2 10*3/uL (ref 1.7–7.7)
Neutrophils Relative %: 75 %
Platelets: 299 10*3/uL (ref 150–400)
RBC: 3.97 MIL/uL — ABNORMAL LOW (ref 4.22–5.81)
RDW: 15.2 % (ref 11.5–15.5)
WBC: 8.2 10*3/uL (ref 4.0–10.5)
nRBC: 0 % (ref 0.0–0.2)

## 2020-09-21 LAB — BASIC METABOLIC PANEL
Anion gap: 13 (ref 5–15)
BUN: 49 mg/dL — ABNORMAL HIGH (ref 8–23)
CO2: 21 mmol/L — ABNORMAL LOW (ref 22–32)
Calcium: 8.5 mg/dL — ABNORMAL LOW (ref 8.9–10.3)
Chloride: 101 mmol/L (ref 98–111)
Creatinine, Ser: 6.05 mg/dL — ABNORMAL HIGH (ref 0.61–1.24)
GFR, Estimated: 9 mL/min — ABNORMAL LOW (ref >60)
Glucose, Bld: 96 mg/dL (ref 70–99)
Potassium: 3.1 mmol/L — ABNORMAL LOW (ref 3.5–5.1)
Sodium: 135 mmol/L (ref 135–145)

## 2020-09-21 LAB — GLUCOSE, CAPILLARY
Glucose-Capillary: 93 mg/dL (ref 70–99)
Glucose-Capillary: 124 mg/dL — ABNORMAL HIGH (ref 70–99)
Glucose-Capillary: 122 mg/dL — ABNORMAL HIGH (ref 70–99)
Glucose-Capillary: 100 mg/dL — ABNORMAL HIGH (ref 70–99)

## 2020-09-21 LAB — HEPARIN LEVEL (UNFRACTIONATED): Heparin Unfractionated: 0.32 IU/mL (ref 0.30–0.70)

## 2020-09-21 MED ORDER — POTASSIUM CHLORIDE CRYS ER 20 MEQ PO TBCR
40.0000 meq | EXTENDED_RELEASE_TABLET | Freq: Four times a day (QID) | ORAL | Status: AC
  Administered 2020-09-21 (×2): 40 meq via ORAL
  Filled 2020-09-21 (×2): qty 2

## 2020-09-21 MED ORDER — ONDANSETRON HCL 4 MG/2ML IJ SOLN
4.0000 mg | Freq: Four times a day (QID) | INTRAMUSCULAR | Status: DC | PRN
  Administered 2020-09-29: 4 mg via INTRAVENOUS
  Filled 2020-09-21: qty 2

## 2020-09-21 MED ORDER — ONDANSETRON HCL 4 MG/2ML IJ SOLN
4.0000 mg | Freq: Four times a day (QID) | INTRAMUSCULAR | Status: DC
  Administered 2020-09-21: 4 mg via INTRAVENOUS
  Filled 2020-09-21: qty 2

## 2020-09-21 MED ORDER — METOLAZONE 5 MG PO TABS
5.0000 mg | ORAL_TABLET | Freq: Two times a day (BID) | ORAL | Status: DC
  Administered 2020-09-21 – 2020-09-23 (×4): 5 mg via ORAL
  Filled 2020-09-21 (×5): qty 1

## 2020-09-21 NOTE — Progress Notes (Signed)
Palliative:  HPI: 69 year old male with V2ZD, chronic systolic and diastolic CHF, PAF supposed to be on Eliquis, HTN, morbid obesity brought to ED 09/08/2020 after he was found down in his house. Palliative care was asked to get involved to introduce concepts of service in the setting of multiple comorbid conditions. Patient newly initiated on hemodialysis.   I met today with [name]. We had a discussion today regarding goals of care and current conditions. I explained my role from palliative care for support and assistance with decision making. He does not recall any issues discussed regarding his kidney function and does not recall having ever had dialysis. He does clearly tell me that he would never want an amputation. However, he does want to live and wants to pursue aggressive treatment to prolong his life. He seems open to long term dialysis but has poor understanding of what this means or looks like. He does feel he can return home and drive himself to and from dialysis treatments. He gives me permission to discuss with his daughter, [name], who he trusts and he identifies as his surrogate Media planner.   I called and spoke with [name] regarding no improvement in renal function. She questions why he hasn't had trial with Bumex infusion as he has not had the best results with Lasix in the past (had better results from torsemide). I will relay her concerns to nephrology. We did discuss plans for dialysis tomorrow and that he is likely nearing a point of requiring dialysis if this is his wish to do so. [name] shares that he has consistently requested full aggressive care other than no amputation. She also reports that he has poor memory and unable to recall conversations with providers or what he has been through this hospital stay.   [name] also reports that they are trying to get his home better prepared for return home. They have some ideas regarding caregiver support as well. They would need to make  arrangements for transport to dialysis if this is what is needed but would likely need medical transportation. She plans to come to the hospital Wednesday ~1545 pm at the latest to meet and complete Advance Directives and continue conversation. She is more available for conversations in the afternoons after 1500 pm.   All questions/concerns addressed. Emotional support provided.   Exam: Alert, oriented but very forgetful with poor insight. No distress. Breathing regular, unlabored. Abd soft. Moving all extremities. Able to reposition self in chair.   Plan: - Patient seems open to long term dialysis if needed to prolong life.  - Only limitation at this point is no amputation.  - Many life changes which is overwhelming to [name].   [place], NP Palliative Medicine Team Pager [phone] (Please see [email or web address] for schedule) Team Phone [phone]    Greater than 50%  of this time was spent counseling and coordinating care related to the above assessment and plan

## 2020-09-21 NOTE — Progress Notes (Signed)
PROGRESS NOTE    [place].  CVE:[id number] DOB: 08-Jul-1951 DOA: 09/08/2020 PCP: Aura Dials, MD   Chief Complaint  Patient presents with  . Wound Infection    Brief Narrative:  69 year old male with W2HE, chronic systolic and diastolic CHF, PAF supposed to be on Eliquis, HTN, morbid obesity brought to ED 09/08/2020 after he was found down in his house.  Reportedly patient was followed.  Protective services, has very unsanitary living condition at home and was found down in the bathroom during welfare check.  Per report: Patient reports falling while standing up from bathroom without LOC, resulting in fall onto R chest and resultant R lower chest pain. Patient was found down and brought into ED.  Of note, patient reports stopping all medications several months ago with exception of torsemide because "I didn't think I needed them anymore." Pt has only been taking torsemide PRN swelling.  He was found to have necrotic foot infections and underwent xray of right foot, which revealed subcortical lucency involving 5th digit concerning for OM.  He was started on vancomycin and cefepime and orthopedic surgery was consulted. ABIs were also ordered. Test was inconclusive due to severe lower extremity edema.   Initially patient was also against surgical treatment options despite severity of his wounds, their obvious chronicity, and inability to explain consequences for avoiding surgery. We requested psychiatry to evaluate patient for decision making ability in this setting. After repeat explanation of his diagnosis and possible treatment options he was able to voice adequate understanding to psychiatry and was considered to have decision making capacity regarding his current infection.   He will need to continue with serial compression wrappings at discharge in efforts to promote better chance of wound healing and proper assessment of ABIs to help decide best treatment course.    Given his significant deconditioning and after discussion with his daughter, there is concern for his ability to safely return home due to poor living situation; Physical therapy was consulted. After PT evaluation he was recommended for SNF at discharge. He was accepting of this and placement was pursued.  Overall, he had good clinical response to IV antibiotics. He was deescalated to Augmentin and doxycycline to finish out a course. He will continue with serial compression wrappings at discharge to rehab as well as following up with Ortho for further management.  During hospitalization he was started on IV Lasix for diuresis given his severe lower extremity edema. His creatinine continued to slowly uptrend. Urine output measuring was inaccurate and difficult to gauge response. Nephrology was consulted on 09/14/2020 given his uptrending creatinine in setting of multiple antibiotics initially and some transient hypotension.  Renal ultrasound showed no hydronephrosis.  He underwent temporary HD catheter placement by IR on 09/16/2020 and nephrology recommended transfer to [facility] for possible short-term hemodialysis. Noncompliant in general and very self-neglected with denial component. Admitted with horrendous feet/legs. Has osteo in R foot 5th digit. Very well possibly in left foot too chronically.  Too edematous for good ABI's per ortho so getting serial compression wrapping with plans for outpt follow up with ortho (on augmentin/doxy now for OM tx).  Patient was transferred to [facility] 11/3 and dialysis was started.  Subjective: Seen and examined.  He is on the bedside chair.  Asking when he can go home. His daughter was on the phone from [place]. Overnight hypotensive 80s to 90s. Map STABLE 74. Potassium 3.1, WBC count hemoglobin stable.  BUN/creatinine 49/6.0 Urine output 100 mL  hematuria  Assessment & Plan:  AKI on CKD stage III with baseline creatinine 1.2-1.3.  seen by Nephrology has undergone extensive work-up . AKI is multifactorial with cardiorenal syndrome, ATN contributing 2/2 sepsis.s/p  temp HD cath by IR 12/2 transferred to [facility] and started on HD:11/3, 11/4,11/5, ultrafiltration limited by hypotension.  Placed on midodrine. 11/6-started Lasix on 120 mg tid 11/6. UOP 100 mL, BUN/creatinine rising. If he fails to respond  May need to consider transitioning to dialysis catheter to [facility] and possible left aVF.  Per family patient may not want to  " live like this"-palliative care following, remains on HD for now. Recent Labs  Lab 09/17/20 0600 09/18/20 0202 09/19/20 0407 09/20/20 0503 09/21/20 0321  BUN 118* 85* 52* 39* 49*  CREATININE 6.79* 6.09* 5.03* 4.79* 6.05*   Acute on chronic combined systolic and diastolic CHF: LVEF 16-10% in LVEF, previously back in March EF was 40-45%: TTE shows lvef 30-35%, severe concentric LVH, global hypokinesia.  Continue IV Lasix/fluid management dialysis as above per nephrology.   Hypotension: Limiting dialysis.  Patient is on midodrine.  Metoprolol dose was decreased.  Hold is bp low   Chronic lymphedema followed by wound care. Continue Unna boot and dressing.  Continue wound care  Osteomyelitis of fifth toe of right foot, osteomyelitis per x-ray, ABI indeterminate due to lower extremity edema.  Serial compression to aid with improving edema, orthopedics had seen the patient and patient refused amputation.  Discussed today he reports he is not going to have amputation. Outpatient follow-up.  Initially on IV antibiotics which have been switched to oral Augmentin and doxycycline-with plan to continue for 2 weeks.  Falls at home/neuropathy: cont pt/ot  Hypokalemia adjust w/ HD  Severe sepsis RUE:AVWUJW is resolved.  Acute metabolic encephalopathy found down at home secondary to uremia mental status is stable.  He ii  alert and oriented x3, conversant, interactive.  PAF with JXB:[id number] with  meds.metoprolol dose decreased for hypotension continue with holding parameters.  Currently on heparin- defer to nephrology converting to Eliquis, may need procedure, TDC-so hold for now   Hematuria after placing foley catheter suspect catheter related trauma:monitor closely on heparin if ongoing/not resolving will need to hold heparin.does not have much urine output.  Hemoglobin overall stable Recent Labs  Lab 09/18/20 0202 09/18/20 2101 09/19/20 0407 09/20/20 0503 09/21/20 0321  HGB 10.7* 11.0* 10.5* 11.3* 10.6*  HCT 34.1* 34.8* 33.2* 36.3* 34.0*   Type 2 diabetes mellitus: A1c 9.6 10/25.  Sugar is stable on sliding scale.  Recent Labs  Lab 09/20/20 0636 09/20/20 1126 09/20/20 1635 09/20/20 2049 09/21/20 0655  GLUCAP 117* 139* 121* 110* 93   Morbid obesity with BMI 43>41, will benefit with weight loss/healthy lifestyle and outpatient sleep apnea evaluation.    QMV:HQION with his daughter [name] [employer] copy)- Overall prognosis remains to be seen, per her request palliative care was consulted family voiced interest in hospice, by hospice as long as patient is getting dialysis not in eligible however if he refuses dialysis may be eligible for hospice, palliative care continues to follow to seek clarity on further disposition per family request.   Wound care: On weekly dressing on Wednesday- on lower extremities, patient has refused amputation.  Continue toenail care washing  Nutrition: Diet Order            Diet Carb Modified Fluid consistency: Thin; Room service appropriate? Yes  Diet effective now  Nutrition Problem: Increased nutrient needs Etiology: wound healing, acute illness Signs/Symptoms: estimated needs Interventions: MVI, Other (Comment) (Prosource Plus)  Body mass index is 41.83 kg/m.  Pressure Ulcer: Pressure Injury 09/08/20 Heel Right Stage 3 -  Full thickness tissue loss. Subcutaneous fat may be visible but bone, tendon or muscle are NOT exposed. 25%  red, 75% slouogh, strong odor, mod brown (Active)  09/08/20   Location: Heel  Location Orientation: Right  Staging: Stage 3 -  Full thickness tissue loss. Subcutaneous fat may be visible but bone, tendon or muscle are NOT exposed.  Wound Description (Comments): 25% red, 75% slouogh, strong odor, mod brown  Present on Admission: Yes     Pressure Injury 09/17/20 (Active)  09/17/20 1805  Location:   Location Orientation:   Staging:   Wound Description (Comments):   Present on Admission:    DVT prophylaxis: heparin gtt Code Status:  Code Status: Full Code  Family Communication: plan of care discussed with patient along with his daughter at the bedside and I had also spoken with another  Daughter [name] who happens to be Therapist, sports.  Status XL:[id number] Remains inpatient appropriate because:IV treatments appropriate due to intensity of illness or inability to take PO and Inpatient level of care appropriate due to severity of illness  Dispo:The patient is from: Home            Anticipated d/c is to: SNF            Anticipated d/c date is: > 3 days            Patient currently is not medically stable to d/c. Consultants:see note  Procedures:see note  Culture/Microbiology    Component Value Date/Time   SDES  09/08/2020 2103    BLOOD LEFT HAND Performed at [facility], [place] [address] [place], [place] [address]    SPECREQUEST  09/08/2020 2103    BOTTLES DRAWN AEROBIC AND ANAEROBIC Blood Culture adequate volume Performed at [place] [address] [place], [place] [address]    CULT  09/08/2020 2103    NO GROWTH 5 DAYS Performed at [place] [address] [place], [place] [address]    REPTSTATUS 09/13/2020 FINAL 09/08/2020 2103    Other culture-see note  Medications: Scheduled Meds: . (feeding supplement) PROSource Plus  30 mL Oral QID  . amoxicillin-clavulanate  1 tablet Oral Daily  . Chlorhexidine Gluconate Cloth  6 each  Topical Daily  . Chlorhexidine Gluconate Cloth  6 each Topical Q0600  . doxycycline  100 mg Oral Q12H  . insulin aspart  0-15 Units Subcutaneous TID WC  . insulin aspart  0-5 Units Subcutaneous QHS  . metolazone  5 mg Oral BID  . metoprolol tartrate  25 mg Oral BID  . midodrine  10 mg Oral TID WC   Continuous Infusions: . furosemide 120 mg (09/21/20 0543)  . heparin 1,750 Units/hr (09/21/20 0100)    Antimicrobials: Anti-infectives (From admission, onward)   Start     Dose/Rate Route Frequency Ordered Stop   09/17/20 1000  amoxicillin-clavulanate (AUGMENTIN) 500-125 MG per tablet 500 mg        1 tablet Oral Daily 09/17/20 0839     09/14/20 2200  amoxicillin-clavulanate (AUGMENTIN) 500-125 MG per tablet 500 mg  Status:  Discontinued        1 tablet Oral 2 times daily 09/14/20 1524 09/17/20 0839   09/12/20 1000  amoxicillin-clavulanate (AUGMENTIN) 875-125 MG per tablet 1 tablet  Status:  Discontinued        1 tablet Oral Every 12 hours 09/12/20 0823 09/14/20 1524   09/12/20 1000  doxycycline (VIBRA-TABS) tablet 100 mg        100 mg Oral Every 12 hours 09/12/20 0823     09/10/20 2000  DAPTOmycin (CUBICIN) 740 mg in sodium chloride 0.9 % IVPB  Status:  Discontinued        740 mg 229.6 mL/hr over 30 Minutes Intravenous Daily 09/10/20 1230 09/12/20 0822   09/10/20 1200  metroNIDAZOLE (FLAGYL) IVPB 500 mg  Status:  Discontinued        500 mg 100 mL/hr over 60 Minutes Intravenous Every 8 hours 09/10/20 1050 09/12/20 0822   09/10/20 1132  vancomycin variable dose per unstable renal function (pharmacist dosing)  Status:  Discontinued         Does not apply See admin instructions 09/10/20 1132 09/10/20 1230   09/09/20 1700  vancomycin (VANCOREADY) IVPB 1500 mg/300 mL  Status:  Discontinued        1,500 mg 150 mL/hr over 120 Minutes Intravenous Every 24 hours 09/08/20 1956 09/10/20 1132   09/09/20 1000  ceFEPIme (MAXIPIME) 2 g in sodium chloride 0.9 % 100 mL IVPB  Status:  Discontinued         2 g 200 mL/hr over 30 Minutes Intravenous Every 12 hours 09/08/20 1947 09/12/20 0822   09/08/20 2200  ceFEPIme (MAXIPIME) 2 g in sodium chloride 0.9 % 100 mL IVPB        2 g 200 mL/hr over 30 Minutes Intravenous  Once 09/08/20 1754 09/08/20 2206   09/08/20 1800  vancomycin (VANCOCIN) IVPB 1000 mg/200 mL premix  Status:  Discontinued        1,000 mg 200 mL/hr over 60 Minutes Intravenous  Once 09/08/20 1754 09/08/20 1756   09/08/20 1630  vancomycin (VANCOREADY) IVPB 2000 mg/400 mL        2,000 mg 200 mL/hr over 120 Minutes Intravenous  Once 09/08/20 1551 09/08/20 1842   09/08/20 1600  piperacillin-tazobactam (ZOSYN) IVPB 3.375 g        3.375 g 100 mL/hr over 30 Minutes Intravenous  Once 09/08/20 1549 09/08/20 1645     Objective: Vitals: Today's Vitals   09/20/20 2154 09/20/20 2213 09/21/20 0500 09/21/20 0549  BP: 92/66   (!) 82/66  Pulse: 80   98  Resp:    19  Temp:    97.8 F (36.6 C)  TempSrc:      SpO2:    95%  Weight:   128.5 kg   Height:      PainSc:  0-No pain      Intake/Output Summary (Last 24 hours) at 09/21/2020 0836 Last data filed at 09/21/2020 0600 Gross per 24 hour  Intake 992.45 ml  Output 100 ml  Net 892.45 ml   Filed Weights   09/19/20 2046 09/20/20 2048 09/21/20 0500  Weight: 128.5 kg 128.5 kg 128.5 kg   Weight change: 0 kg  Intake/Output from previous day: 11/06 0701 - 11/07 0700 In: 992.5 [P.O.:560; I.V.:308.5; IV Piggyback:124] Out: 100 [Urine:100] Intake/Output this shift: No intake/output data recorded.  Examination: General exam: AAO, obese, not in acute distress, NAD, weak appearing. HEENT:Oral mucosa moist, Ear/Nose WNL grossly, dentition normal. Respiratory system: bilaterally diminished breath sound,no wheezing or crackles,no use of accessory muscle Cardiovascular system: S1 & S2 +, No JVD,. Gastrointestinal system: Abdomen soft, NT,ND, BS+ Nervous System:Alert, awake, moving extremities and grossly nonfocal Extremities: Chronic  lymphedema/dressing in place,  long toenails , foul order Skin: No rashes,no icterus. MSK: Normal muscle bulk,tone, power TDC+ on rt chest.  Data Reviewed: I have personally reviewed following labs and imaging studies CBC: Recent Labs  Lab 09/17/20 0600 09/17/20 0600 09/18/20 0202 09/18/20 2101 09/19/20 0407 09/20/20 0503 09/21/20 0321  WBC 10.4  --  12.5*  --  9.8 10.5 8.2  NEUTROABS 7.6  --  9.4*  --  6.6 7.1 6.2  HGB 11.0*   < > 10.7* 11.0* 10.5* 11.3* 10.6*  HCT 36.0*   < > 34.1* 34.8* 33.2* 36.3* 34.0*  MCV 87.4  --  84.0  --  86.2 86.2 85.6  PLT 350  --  343  --  304 348 299   < > = values in this interval not displayed.   Basic Metabolic Panel: Recent Labs  Lab 09/16/20 0456 09/16/20 0456 09/17/20 0600 09/18/20 0202 09/19/20 0407 09/20/20 0503 09/21/20 0321  NA 135   < > 136 136 134* 134* 135  K 4.3   < > 4.3 3.7 3.2* 3.5 3.1*  CL 106   < > 105 104 98 99 101  CO2 16*   < > 16* 18* 23 24 21*  GLUCOSE 168*   < > 169* 159* 126* 130* 96  BUN 105*   < > 118* 85* 52* 39* 49*  CREATININE 5.91*   < > 6.79* 6.09* 5.03* 4.79* 6.05*  CALCIUM 7.9*   < > 8.1* 8.0* 7.9* 8.3* 8.5*  MG 2.0  --  2.2 1.7 1.8 1.8  --   PHOS 8.0*  --  9.0* 7.0* 6.7* 5.7*  --    < > = values in this interval not displayed.   GFR: Estimated Creatinine Clearance: 14.9 mL/min (A) (by C-G formula based on SCr of 6.05 mg/dL (H)). Liver Function Tests: No results for input(s): AST, ALT, ALKPHOS, BILITOT, PROT, ALBUMIN in the last 168 hours. No results for input(s): LIPASE, AMYLASE in the last 168 hours. No results for input(s): AMMONIA in the last 168 hours. Coagulation Profile: No results for input(s): INR, PROTIME in the last 168 hours. Cardiac Enzymes: Recent Labs  Lab 09/14/20 1222  CKTOTAL 41*   BNP (last 3 results) No results for input(s): PROBNP in the last 8760 hours. HbA1C: No results for input(s): HGBA1C in the last 72 hours. CBG: Recent Labs  Lab 09/20/20 0636 09/20/20 1126  09/20/20 1635 09/20/20 2049 09/21/20 0655  GLUCAP 117* 139* 121* 110* 93   Lipid Profile: No results for input(s): CHOL, HDL, LDLCALC, TRIG, CHOLHDL, LDLDIRECT in the last 72 hours. Thyroid Function Tests: No results for input(s): TSH, T4TOTAL, FREET4, T3FREE, THYROIDAB in the last 72 hours. Anemia Panel: No results for input(s): VITAMINB12, FOLATE, FERRITIN, TIBC, IRON, RETICCTPCT in the last 72 hours. Sepsis Labs: No results for input(s): PROCALCITON, LATICACIDVEN in the last 168 hours.  No results found for this or any previous visit (from the past 240 hour(s)).   Radiology Studies: No results found.   LOS: 13 days   Antonieta Pert, MD [facility]  09/21/2020, 8:36 AM

## 2020-09-21 NOTE — Progress Notes (Signed)
[facility] Progress Note  Subjective: Without any specific complaints today.  Does not think he urinated very much yesterday Vitals:   09/21/20 0549 09/21/20 0838 09/21/20 0839 09/21/20 0841  BP: (!) 82/66  (!) 82/49 96/65  Pulse: 98  82 (!) 110  Resp: 19     Temp: 97.8 F (36.6 C) (!) 97.5 F (36.4 C)    TempSrc:  Axillary    SpO2: 95%  96% 96%  Weight:      Height:        Exam:  GEN: Chronically ill-appearing, sitting in chair, no distress ENT: no nasal discharge, mmm EYES: no scleral icterus, eomi CV: Normal rate, extensive lower extremity edema bilaterally PULM: no iwob, bilateral chest rise ABD: NABS, distended abdomen SKIN: Bilateral venous stasis changes with discoloration of the toes, otherwise no rashes or jaundice     UA 10/31 - 100 prot, 0-5 rbc/ wbc, rare bact    UNa < 40, UCr 166.2    RUS 10/31 - 14.5/ 14.6 cm kidneys w/o hydro, normal echotexture, normal bladder appearance, limited study d/t body habitus   Summary: 69 yo hx diast CHF, DM2, afib , HTN found down in [place] during welfare check, currently f/b APS. Pt reported fall in [place] same day. Pt reported stopping all medications mos ago except torsemide, "didn't think I needed them anymore". In ED had afib /RVR and diabetic foot infection, started on IV vanc/ cefepime, po metoprolol started and pt admitted. MRI showed OM R foot. B/L creat is 1.3- 1.4, creat here 1.9 on admit, creatinine is continued to rise consult for acute renal failure  Assessment/ Plan: 1. AKI on CKD 3b - BL creat 1.3-1.9, eGFR 35- 51 ml/min.  Creatinine and BUN continue to rise.  Has had borderline low blood pressures and extensive volume overload.  Kidney injury likely multifactorial with cardiorenal syndrome, ATN contributing.  Started dialysis on 11/3 tolerated the first 2 sessions with excellent ultrafiltration but some hypotension on 11/5.  Holding dialysis over the weekend and trialing IV Lasix 120 mg 3 times daily.  Minimal  response yesterday.  Will add metolazone.  If he does not respond may need to get tunneled dialysis catheter and AV fistula.  Continuing goals of care with palliative care.  Will plan on dialysis tomorrow  2. CHF with diastolic and systolic component w/ decreased EF of 30 to 35% on echocardiogram.  Continues to be volume overloaded likely to cardiorenal syndrome.  Continue volume removal with dialysis.  On midodrine.  Trialing diuretics as above 3. HTN - relatively hypotensive at this time. BP's 100's. On metoprolol xl 50 for afib.  Continue midodrine at current dose 4. T2DM - management per primary 5. Afib w/ RVR - continue rate control as tolerated also with hypotension 6. Sepsis w/ osteomyelitis of the 5th toe on rt foot -antibiotics per primary team     [name]  09/21/2020, 8:46 AM   Recent Labs  Lab 09/19/20 0407 09/19/20 0407 09/20/20 0503 09/21/20 0321  K 3.2*   < > 3.5 3.1*  BUN 52*   < > 39* 49*  CREATININE 5.03*   < > 4.79* 6.05*  CALCIUM 7.9*   < > 8.3* 8.5*  PHOS 6.7*  --  5.7*  --   HGB 10.5*   < > 11.3* 10.6*   < > = values in this interval not displayed.   Inpatient medications: . (feeding supplement) PROSource Plus  30 mL Oral QID  . amoxicillin-clavulanate  1 tablet Oral Daily  . Chlorhexidine Gluconate Cloth  6 each Topical Daily  . Chlorhexidine Gluconate Cloth  6 each Topical Q0600  . doxycycline  100 mg Oral Q12H  . insulin aspart  0-15 Units Subcutaneous TID WC  . insulin aspart  0-5 Units Subcutaneous QHS  . metolazone  5 mg Oral BID  . metoprolol tartrate  25 mg Oral BID  . midodrine  10 mg Oral TID WC   . furosemide 120 mg (09/21/20 0543)  . heparin 1,750 Units/hr (09/21/20 0100)   acetaminophen **OR** acetaminophen, HYDROmorphone (DILAUDID) injection, labetalol, lidocaine, melatonin, sodium chloride flush

## 2020-09-21 NOTE — Progress Notes (Signed)
ANTICOAGULATION CONSULT NOTE - Follow Up Consult  Pharmacy Consult for Heparin Indication: atrial fibrillation  Allergies  Allergen Reactions  . Codeine Swelling    Water retention  . Sulfur Other (See Comments)    Not sure of reaction a young child    Patient Measurements: Height: 5\' 9"  (175.3 cm) Weight: 128.5 kg (283 lb 4.7 oz) IBW/kg (Calculated) : 70.7 Heparin Dosing Weight: 93 kg  Vital Signs: Temp: 97.5 F (36.4 C) (11/07 0838) Temp Source: Axillary (11/07 0838) BP: 96/65 (11/07 0841) Pulse Rate: 110 (11/07 0841)  Labs: Recent Labs    09/19/20 0407 09/19/20 0407 09/20/20 0503 09/21/20 0321  HGB 10.5*   < > 11.3* 10.6*  HCT 33.2*  --  36.3* 34.0*  PLT 304  --  348 299  HEPARINUNFRC 0.43  --  0.35 0.32  CREATININE 5.03*  --  4.79* 6.05*   < > = values in this interval not displayed.    Estimated Creatinine Clearance: 14.9 mL/min (A) (by C-G formula based on SCr of 6.05 mg/dL (H)).  Assessment: 69yoM admitted after a fall and was found to have osteomyelitis of his feet. PMH s/f atrial fibrillation on apixaban. Patient was transitioned from apixaban to heparin for possible surgery. He declined surgery and was restarted on apixaban 09/10/20.  Pharmacy was consulted for transition back to IV heparin on 11/1 for AKI and HD catheter replacement. Last dose of apixaban given 11/1, s/p HD cath placement on 11/2. AKI, s/p HD and UF, currently on diuretics.   Heparin level remains therapeutic (0.32) on 1750 units/hr. CBC stable.   Prior hematuria suspected catheter-related trauma per note. Continuing heparin for now but may need to hold if it happens again.   Goal of Therapy:  Heparin level 0.3-0.7 units/ml Monitor platelets by anticoagulation protocol: Yes   Plan:  Continue heparin drip at 1750 units/hr Daily heparin level and CBC Monitor for hematuria, other bleeding. Noted may need TDC and AV fistula for HD, if no response from diuretics. Follow up for oral  anticoagulation when able.   Arty Baumgartner, [place] Phone: [phone] 09/21/2020,10:25 AM

## 2020-09-21 NOTE — Plan of Care (Signed)
  Problem: Education: Goal: Knowledge of General Education information will improve Description Including pain rating scale, medication(s)/side effects and non-pharmacologic comfort measures Outcome: Progressing   

## 2020-09-21 NOTE — Progress Notes (Signed)
Patient daughter requesting pain medicine on behalf of  Patient. Patient says "not much pain but pain medicine wouldn't hurt". Patient was given tylenol earlier. Daughter declines tylenol. Dilaudid given. Reported off to oncoming nurse.

## 2020-09-22 DIAGNOSIS — M869 Osteomyelitis, unspecified: Secondary | ICD-10-CM | POA: Diagnosis not present

## 2020-09-22 LAB — BASIC METABOLIC PANEL
Anion gap: 14 (ref 5–15)
BUN: 63 mg/dL — ABNORMAL HIGH (ref 8–23)
CO2: 19 mmol/L — ABNORMAL LOW (ref 22–32)
Calcium: 8.7 mg/dL — ABNORMAL LOW (ref 8.9–10.3)
Chloride: 104 mmol/L (ref 98–111)
Creatinine, Ser: 6.93 mg/dL — ABNORMAL HIGH (ref 0.61–1.24)
GFR, Estimated: 8 mL/min — ABNORMAL LOW (ref >60)
Glucose, Bld: 116 mg/dL — ABNORMAL HIGH (ref 70–99)
Potassium: 3.9 mmol/L (ref 3.5–5.1)
Sodium: 137 mmol/L (ref 135–145)

## 2020-09-22 LAB — CBC WITH DIFFERENTIAL/PLATELET
Abs Immature Granulocytes: 0.09 10*3/uL — ABNORMAL HIGH (ref 0.00–0.07)
Basophils Absolute: 0.1 10*3/uL (ref 0.0–0.1)
Basophils Relative: 1 %
Eosinophils Absolute: 0 10*3/uL (ref 0.0–0.5)
Eosinophils Relative: 0 %
HCT: 36.4 % — ABNORMAL LOW (ref 39.0–52.0)
Hemoglobin: 11 g/dL — ABNORMAL LOW (ref 13.0–17.0)
Immature Granulocytes: 1 %
Lymphocytes Relative: 15 %
Lymphs Abs: 1.2 10*3/uL (ref 0.7–4.0)
MCH: 26.6 pg (ref 26.0–34.0)
MCHC: 30.2 g/dL (ref 30.0–36.0)
MCV: 88.1 fL (ref 80.0–100.0)
Monocytes Absolute: 1.1 10*3/uL — ABNORMAL HIGH (ref 0.1–1.0)
Monocytes Relative: 14 %
Neutro Abs: 5.5 10*3/uL (ref 1.7–7.7)
Neutrophils Relative %: 69 %
Platelets: 291 10*3/uL (ref 150–400)
RBC: 4.13 MIL/uL — ABNORMAL LOW (ref 4.22–5.81)
RDW: 15.5 % (ref 11.5–15.5)
WBC: 8.1 10*3/uL (ref 4.0–10.5)
nRBC: 0 % (ref 0.0–0.2)

## 2020-09-22 LAB — GLUCOSE, CAPILLARY
Glucose-Capillary: 92 mg/dL (ref 70–99)
Glucose-Capillary: 86 mg/dL (ref 70–99)
Glucose-Capillary: 110 mg/dL — ABNORMAL HIGH (ref 70–99)
Glucose-Capillary: 122 mg/dL — ABNORMAL HIGH (ref 70–99)

## 2020-09-22 LAB — HEPARIN LEVEL (UNFRACTIONATED): Heparin Unfractionated: 0.59 IU/mL (ref 0.30–0.70)

## 2020-09-22 MED ORDER — MIDODRINE HCL 5 MG PO TABS
ORAL_TABLET | ORAL | Status: AC
  Administered 2020-09-22: 10 mg via ORAL
  Filled 2020-09-22: qty 2

## 2020-09-22 MED ORDER — HEPARIN SODIUM (PORCINE) 1000 UNIT/ML IJ SOLN
INTRAMUSCULAR | Status: AC
  Administered 2020-09-22: 2800 [IU] via ARTERIOVENOUS_FISTULA
  Filled 2020-09-22: qty 3

## 2020-09-22 MED ORDER — TRAZODONE HCL 50 MG PO TABS
25.0000 mg | ORAL_TABLET | Freq: Every day | ORAL | Status: DC
  Administered 2020-09-22 – 2020-09-30 (×9): 25 mg via ORAL
  Filled 2020-09-22 (×9): qty 1

## 2020-09-22 MED ORDER — ALPRAZOLAM 0.25 MG PO TABS
0.2500 mg | ORAL_TABLET | Freq: Every day | ORAL | Status: DC | PRN
  Administered 2020-09-22 – 2020-09-30 (×3): 0.25 mg via ORAL
  Filled 2020-09-22 (×3): qty 1

## 2020-09-22 NOTE — Progress Notes (Addendum)
[facility] NEPHROLOGY PROGRESS NOTE  Assessment/ Plan: Pt is a 69 y.o. yo male with history of CHF, DM, A. fib, hypertension found down at home during welfare check currently followed by APS.  In ED he had A. fib with RVR, diabetic foot infection and acute kidney injury.  #Acute kidney injury on CKD stage IIIb: Baseline creatinine level 1.3-1.9.  AKI multifactorial etiology including cardiorenal syndrome, ATN.  Started dialysis on 11/3 and has been tolerating well.  Tried high-dose IV diuretics with no response.  No sign of renal recovery at the moment.  We will go ahead and consult IR to place tunneled catheter and order vein mapping.  Seen by palliative care and plan to continue treatments including dialysis. HD today, continue MWF schedule.   He will need outpatient HD arranged for AKI.  #CHF with systolic and diastolic dysfunction: Volume overload causing cardiorenal syndrome.  Did not respond with IV diuretics therefore now volume management with dialysis. Discontinue IV Lasix.  #Hypertension: On metoprolol for A. fib.  Requiring midodrine for intradialytic hypotension.  #Sepsis with osteomyelitis of fifth toe on the right foot: On antibiotics per primary team.  #Anemia due to sepsis, AKI: Hemoglobin at goal.  #Metabolic acidosis: Managed with dialysis now.  Addendum: Per palliative care, the daughter is coming to the hospital tomorrow to discuss goals of care.  I will cancel IR consult until the meeting.  Subjective: Seen and examined at dialysis unit.  Tolerating well.  Received IV diuretics with only 300 cc of urine output.  Tolerating dialysis well so far.  Patient agreed for permanent HD catheter placement and dialysis. Objective Vital signs in last 24 hours: Vitals:   09/22/20 0840 09/22/20 0910 09/22/20 0940 09/22/20 1010  BP: 100/71 110/65 95/73 99/63   Pulse:   (!) 56 62  Resp: 18 17 16 18   Temp:      TempSrc:      SpO2:   96% 94%  Weight:      Height:        Weight change:   Intake/Output Summary (Last 24 hours) at 09/22/2020 1131 Last data filed at 09/22/2020 1478 Gross per 24 hour  Intake 1462.62 ml  Output 300 ml  Net 1162.62 ml       Labs: Basic Metabolic Panel: Recent Labs  Lab 09/18/20 0202 09/18/20 0202 09/19/20 0407 09/19/20 0407 09/20/20 0503 09/21/20 0321 09/22/20 0301  NA 136   < > 134*   < > 134* 135 137  K 3.7   < > 3.2*   < > 3.5 3.1* 3.9  CL 104   < > 98   < > 99 101 104  CO2 18*   < > 23   < > 24 21* 19*  GLUCOSE 159*   < > 126*   < > 130* 96 116*  BUN 85*   < > 52*   < > 39* 49* 63*  CREATININE 6.09*   < > 5.03*   < > 4.79* 6.05* 6.93*  CALCIUM 8.0*   < > 7.9*   < > 8.3* 8.5* 8.7*  PHOS 7.0*  --  6.7*  --  5.7*  --   --    < > = values in this interval not displayed.   Liver Function Tests: No results for input(s): AST, ALT, ALKPHOS, BILITOT, PROT, ALBUMIN in the last 168 hours. No results for input(s): LIPASE, AMYLASE in the last 168 hours. No results for input(s): AMMONIA in the last 168 hours.  CBC: Recent Labs  Lab 09/18/20 0202 09/18/20 2101 09/19/20 0407 09/19/20 0407 09/20/20 0503 09/21/20 0321 09/22/20 0301  WBC 12.5*  --  9.8   < > 10.5 8.2 8.1  NEUTROABS 9.4*  --  6.6   < > 7.1 6.2 5.5  HGB 10.7*   < > 10.5*   < > 11.3* 10.6* 11.0*  HCT 34.1*   < > 33.2*   < > 36.3* 34.0* 36.4*  MCV 84.0  --  86.2  --  86.2 85.6 88.1  PLT 343  --  304   < > 348 299 291   < > = values in this interval not displayed.   Cardiac Enzymes: No results for input(s): CKTOTAL, CKMB, CKMBINDEX, TROPONINI in the last 168 hours. CBG: Recent Labs  Lab 09/21/20 0655 09/21/20 1132 09/21/20 1620 09/21/20 2030 09/22/20 0655  GLUCAP 93 124* 122* 100* 92    Iron Studies: No results for input(s): IRON, TIBC, TRANSFERRIN, FERRITIN in the last 72 hours. Studies/Results: No results found.  Medications: Infusions: . furosemide 120 mg (09/22/20 1610)  . heparin 1,750 Units/hr (09/21/20 1846)    Scheduled  Medications: . heparin sodium (porcine)      . (feeding supplement) PROSource Plus  30 mL Oral QID  . amoxicillin-clavulanate  1 tablet Oral Daily  . Chlorhexidine Gluconate Cloth  6 each Topical Daily  . doxycycline  100 mg Oral Q12H  . insulin aspart  0-15 Units Subcutaneous TID WC  . insulin aspart  0-5 Units Subcutaneous QHS  . metolazone  5 mg Oral BID  . metoprolol tartrate  25 mg Oral BID  . midodrine  10 mg Oral TID WC    have reviewed scheduled and prn medications.  Physical Exam: General:NAD, comfortable Heart:RRR, s1s2 nl Lungs:clear b/l, no crackle Abdomen:soft, Non-tender, non-distended Extremities: Edema present Dialysis Access: Neck temporary HD catheter  Quetzal Meany Prasad Ana Liaw 09/22/2020,11:31 AM  LOS: 14 days  Pager: [phone]

## 2020-09-22 NOTE — Plan of Care (Signed)
  Problem: Education: Goal: Knowledge of General Education information will improve Description Including pain rating scale, medication(s)/side effects and non-pharmacologic comfort measures Outcome: Progressing   

## 2020-09-22 NOTE — Progress Notes (Signed)
ANTICOAGULATION CONSULT NOTE - Follow Up Consult  Pharmacy Consult for Heparin Indication: atrial fibrillation  Allergies  Allergen Reactions  . Codeine Swelling    Water retention  . Sulfur Other (See Comments)    Not sure of reaction a young child    Patient Measurements: Height: 5\' 9"  (175.3 cm) Weight: 126.9 kg (279 lb 12.2 oz) IBW/kg (Calculated) : 70.7 Heparin Dosing Weight: 93 kg  Vital Signs: Temp: 97.9 F (36.6 C) (11/08 0750) Temp Source: Oral (11/08 0750) BP: 89/72 (11/08 0800) Pulse Rate: 105 (11/08 0449)  Labs: Recent Labs    09/20/20 0503 09/20/20 0503 09/21/20 0321 09/22/20 0301  HGB 11.3*   < > 10.6* 11.0*  HCT 36.3*  --  34.0* 36.4*  PLT 348  --  299 291  HEPARINUNFRC 0.35  --  0.32 0.59  CREATININE 4.79*  --  6.05* 6.93*   < > = values in this interval not displayed.    Estimated Creatinine Clearance: 12.9 mL/min (A) (by C-G formula based on SCr of 6.93 mg/dL (H)).  Assessment: 69yoM admitted after a fall and was found to have osteomyelitis of his feet. PMH s/f atrial fibrillation on apixaban. Patient was transitioned from apixaban to heparin for possible surgery. He declined surgery and was restarted on apixaban 09/10/20.  Pharmacy was consulted for transition back to IV heparin on 11/1 for AKI and HD catheter replacement. Last dose of apixaban given 11/1, s/p HD cath placement on 11/2. AKI, s/p HD and UF, currently on diuretics.  Heparin level this morning remains therapeutic (HL 0.59 << 0.32, goal of 0.3-0.7). The patient has had some UOP in response to diuretics - total of 300 cc/24h - which is being charted as bloody/dark red. This has been noted previously and MD is aware. CBC is stable - will await plans from MD upon rounds today on continuing vs holding heparin. Will continue for now and reduce slightly to target lower goal range.  Goal of Therapy:  Heparin level 0.3-0.7 units/ml Monitor platelets by anticoagulation protocol: Yes    Plan:  - Reduce Heparin slightly to 1700 units/hr (17 ml/hr) - Will continue to monitor CBC, hematuria (MD aware) - Will follow-up on plans to transition back to oral AC once no further procedures are planned - Will continue to monitor for any signs/symptoms of bleeding and will follow up with heparin level in the a.m.   Thank you for allowing pharmacy to be a part of this patient's care.  Alycia Rossetti, PharmD, BCPS Clinical Pharmacist Clinical phone for 09/22/2020: [phone] 09/22/2020 9:15 AM   **Pharmacist phone directory can now be found on [email or web address] ([email or web address]).  Listed under [place].

## 2020-09-22 NOTE — Plan of Care (Signed)
  Problem: Activity: Goal: Risk for activity intolerance will decrease Outcome: Progressing   

## 2020-09-22 NOTE — Progress Notes (Signed)
This nurse offered to transfer patient from chair to bed during bedtime but patient advised that he would like to sleep in the chair as he is more comfortable in the chair.. Patient was advised about the need to elevate his legs as he sleeps in the chair to decrease swelling in his lower extremities. Patient verbalized understanding however, still declined. Will continue to monitor.   Janssen Zee,RN.

## 2020-09-22 NOTE — Progress Notes (Signed)
PT Cancellation Note  Patient Details Name: [name]. MRN: [id number] DOB: 1951/04/20   Cancelled Treatment:    Reason Eval/Treat Not Completed: Medical issues which prohibited therapy patient first at HD, returned late morning but RN reporting that he is still bleeding from his HD cath site. Holding for now, will attempt to check back in later this afternoon if time/schedule allow.    Windell Norfolk, DPT, PN1   Supplemental Physical Therapist [facility]    Pager [phone] Acute Rehab Office [phone]

## 2020-09-22 NOTE — Evaluation (Signed)
Occupational Therapy Evaluation Patient Details Name: [name]. MRN: [id number] DOB: [birth year] Today's Date: 09/22/2020    History of Present Illness 69 y.o. male with PMH of DM2, CHF, afib, HTN, R THA in 2016 presents to ED after being found down in the bathroom this AM during welfare check; xray found osteomyelitis of 5th toe on R foot.   Clinical Impression   PTA, pt lives with wife and reports Independence in all daily tasks without need for AD, but did occasionally use RW outside of the home. Pt presents now with deficits in balance, endurance, and cognition. Pt with decreased safety awareness and overall awareness of his medical complexities. Pt reports he has been walking using RW to/from bathroom without difficulty and able to demonstrate this to therapists today. Pt requires Setup for UB ADLs and Min A for LB ADLs.   Pending clarification of pt's weightbearing status (due to extensive wounds and pt declining surgical mgmt), recommending HH vs SNF. If pt WBAT for B LE, pt able to mobilize at home using RW though anticipate poor carryover with wound healing efforts. If WB restrictions put in place, pt may need short term rehab at SNF as pt will have difficulty completing daily tasks and maintaining WB precautions. Will continue to monitor and update accordingly.     Follow Up Recommendations  Home health OT;SNF (HHOT vs SNF pending WB/progression)    Equipment Recommendations  None recommended by OT    Recommendations for Other Services       Precautions / Restrictions Precautions Precautions: Fall Required Braces or Orthoses: Other Brace Other Brace: R foot PRAFO (in orders, but too small), Unna boots Restrictions Weight Bearing Restrictions: No Other Position/Activity Restrictions: Clarifying with Dr. Sharol Given for [facility] restrictions      Mobility Bed Mobility               General bed mobility comments: sitting in recliner on entry    Transfers Overall  transfer level: Needs assistance Equipment used: Rolling walker (2 wheeled) Transfers: Sit to/from Stand Sit to Stand: Supervision         General transfer comment: Supervision for sit to stand transfer with RW, no LOB noted    Balance Overall balance assessment: Needs assistance Sitting-balance support: No upper extremity supported;Feet supported Sitting balance-Leahy Scale: Good     Standing balance support: Bilateral upper extremity supported;During functional activity Standing balance-Leahy Scale: Poor Standing balance comment: reliant on UE support                           ADL either performed or assessed with clinical judgement   ADL Overall ADL's : Needs assistance/impaired Eating/Feeding: Independent;Sitting   Grooming: Set up;Sitting   Upper Body Bathing: Set up;Sitting   Lower Body Bathing: Min guard;Sit to/from stand   Upper Body Dressing : Set up;Sitting   Lower Body Dressing: Sit to/from stand;Minimal assistance Lower Body Dressing Details (indicate cue type and reason): Max A to don socks. Pt reports he does not wear socks or footwear at home typically, usually sandals if anything. Educated on Social research officer, government: Supervision/safety;Ambulation;Regular Toilet;RW   Toileting- Water quality scientist and Hygiene: Min guard;Sit to/from stand       Functional mobility during ADLs: Supervision/safety;Rolling walker General ADL Comments: Pt reports he has been ambulating to bathroom with staff using RW. No unsteadiness noted with use of DME. Pt denies any pain.      Vision Patient Visual Report:  No change from baseline Vision Assessment?: No apparent visual deficits     Perception     Praxis      Pertinent Vitals/Pain Pain Assessment: No/denies pain     Hand Dominance Right   Extremity/Trunk Assessment Upper Extremity Assessment Upper Extremity Assessment: Overall WFL for tasks assessed   Lower Extremity Assessment Lower  Extremity Assessment: Defer to PT evaluation   Cervical / Trunk Assessment Cervical / Trunk Assessment: Normal   Communication Communication Communication: No difficulties   Cognition Arousal/Alertness: Awake/alert Behavior During Therapy: WFL for tasks assessed/performed Overall Cognitive Status: No family/caregiver present to determine baseline cognitive functioning Area of Impairment: Memory;Safety/judgement;Awareness                     Memory: Decreased short-term memory   Safety/Judgement: Decreased awareness of safety;Decreased awareness of deficits Awareness: Emergent   General Comments: Pt A&O, but decreased awareness of the seriousness of his medical complexities (dialysis, wounds, osteomyelitis and declining surgical amputation). Pt asking therapists what is dialysis and what does it do   General Comments  Secure chat sent to Dr. Sharol Given in collaboration with PT. If pt able to WBAT B LE, pt able to mobilize safely at home. However, if WB precautions needed for wound healing, pt will require increased assistance and would need further rehab.    Exercises     Shoulder Instructions      Home Living Family/patient expects to be discharged to:: Private residence Living Arrangements: Spouse/significant other Available Help at Discharge: Family;Available PRN/intermittently (daughter is a Marine scientist) Type of Home: House Home Access: Stairs to enter [employer] of Steps: 4-5 into game room, 1 into house Entrance Stairs-Rails: None Home Layout: Able to live on main level with bedroom/bathroom;Two level Alternate Level Stairs-Number of Steps: 14 Alternate Level Stairs-Rails: None Bathroom Shower/Tub: Occupational psychologist: Standard     Home Equipment: Environmental consultant - 2 wheels;Cane - single point;Bedside commode          Prior Functioning/Environment Level of Independence: Independent        Comments: Pt reports independent with ADLs, ambulates  community distances with RW or shopping cart, 1 fall in last 6 months, drives.        OT Problem List: Decreased activity tolerance;Impaired balance (sitting and/or standing);Decreased knowledge of use of DME or AE;Decreased cognition;Decreased safety awareness;Decreased knowledge of precautions      OT Treatment/Interventions: Self-care/ADL training;Therapeutic exercise;DME and/or AE instruction;Therapeutic activities;Patient/family education    OT Goals(Current goals can be found in the care plan section) Acute Rehab OT Goals Patient Stated Goal: "go back home" OT Goal Formulation: With patient Time For Goal Achievement: 10/06/20 Potential to Achieve Goals: Good  OT Frequency: Min 2X/week   Barriers to D/C:            Co-evaluation PT/OT/SLP Co-Evaluation/Treatment: Yes Reason for Co-Treatment: Complexity of the patient's impairments (multi-system involvement);For patient/therapist safety;To address functional/ADL transfers (avoid overfatiguing from dialysis)   OT goals addressed during session: ADL's and self-care      AM-PAC OT "6 Clicks" Daily Activity     Outcome Measure Help from another person eating meals?: None Help from another person taking care of personal grooming?: A Little Help from another person toileting, which includes using toliet, bedpan, or urinal?: A Little Help from another person bathing (including washing, rinsing, drying)?: A Little Help from another person to put on and taking off regular upper body clothing?: A Little Help from another person to put on and  taking off regular lower body clothing?: A Little 6 Click Score: 19   End of Session Equipment Utilized During Treatment: Gait belt;Rolling walker Nurse Communication: Mobility status  Activity Tolerance: Patient tolerated treatment well Patient left: in chair;with call bell/phone within reach;with chair alarm set  OT Visit Diagnosis: Other abnormalities of gait and mobility (R26.89);Muscle  weakness (generalized) (M62.81)                Time: 5208-0223 OT Time Calculation (min): 24 min Charges:  OT General Charges $OT Visit: 1 Visit OT Evaluation $OT Eval Moderate Complexity: 1 Mod  Layla Maw, OTR/L  Layla Maw 09/22/2020, 2:31 PM

## 2020-09-22 NOTE — Progress Notes (Signed)
Palliative:  HPI: 69 year old male with S0FU, chronic systolic and diastolic CHF, PAF supposed to be on Eliquis, HTN, morbid obesity brought to ED 09/08/2020 after he was found down in his house. Palliative care was asked to get involved to introduce concepts of service in the setting of multiple comorbid conditions. Patient newly initiated on hemodialysis.  Discussions between daughter, [name], and coordinating with Dr. Lupita Leash and Dr. Carolin Sicks. [name] has many concerns regarding her father's true understanding of dialysis and what this means and looks like for him. I agree with her concerns as my discussions with him yesterday he had no recollection of having had dialysis previously and did not know what this was or that he has kidney issues. We do feel that he will likely desire to pursue dialysis but [name] feels that he will likely be dissatisfied with his quality of life and not desire to continue these measures in the near future once he experiences what this looks like for him. She also had many questions regarding osteomyelitis diagnosis and I reviewed with her ortho consult and recommendations. She has concerns that he has untreated anxiety and feels this is unfair to him and requests benzo for his comfort and to assist him through this hospitalization - I will add low dose Xanax as needed per her request. I will also add low dose trazodone nightly to assist with sleep and complaints of insomnia. Will monitor and reassess tolerance tomorrow.   Coordination resulted in meeting planned for 09/23/20 at 1000 am. Hopeful to develop final plan for dialysis, SNF rehab vs home, and complete Advance Directives at this time. I have discussed with Dr. Lupita Leash and Dr. Carolin Sicks who plan to be at meeting tomorrow to assist with answering all questions and final plans.   All questions/concerns addressed. Emotional support provided.   I did not perform assessment of patient today as he was in dialysis treatment during these  conversations and coordination.   25 min  Plan: - Family meeting with treatment team tomorrow 93/2/35 5732 am.    Vinie Sill, NP Palliative Medicine Team Pager [phone] (Please see [email or web address] for schedule) Team Phone [phone]    Greater than 50%  of this time was spent counseling and coordinating care related to the above assessment and plan

## 2020-09-22 NOTE — Progress Notes (Signed)
PROGRESS NOTE    [name].  EXB:[id number] DOB: 09/02/1951 DOA: 09/08/2020 PCP: Aura Dials, MD   Chief Complaint  Patient presents with  . Wound Infection    Brief Narrative:  69 year old male with N0UV, chronic systolic and diastolic CHF, PAF supposed to be on Eliquis, HTN, morbid obesity brought to ED 09/08/2020 after he was found down in his house.  Reportedly patient was followed.  Protective services, has very unsanitary living condition at home and was found down in the bathroom during welfare check.  Per report: Patient reports falling while standing up from bathroom without LOC, resulting in fall onto R chest and resultant R lower chest pain. Patient was found down and brought into ED.  Of note, patient reports stopping all medications several months ago with exception of torsemide because "I didn't think I needed them anymore." Pt has only been taking torsemide PRN swelling.  He was found to have necrotic foot infections and underwent xray of right foot, which revealed subcortical lucency involving 5th digit concerning for OM.  He was started on vancomycin and cefepime and orthopedic surgery was consulted. ABIs were also ordered. Test was inconclusive due to severe lower extremity edema.   Initially patient was also against surgical treatment options despite severity of his wounds, their obvious chronicity, and inability to explain consequences for avoiding surgery. We requested psychiatry to evaluate patient for decision making ability in this setting. After repeat explanation of his diagnosis and possible treatment options he was able to voice adequate understanding to psychiatry and was considered to have decision making capacity regarding his current infection.   He will need to continue with serial compression wrappings at discharge in efforts to promote better chance of wound healing and proper assessment of ABIs to help decide best treatment course.    Given his significant deconditioning and after discussion with his daughter, there is concern for his ability to safely return home due to poor living situation; Physical therapy was consulted. After PT evaluation he was recommended for SNF at discharge. He was accepting of this and placement was pursued.  Overall, he had good clinical response to IV antibiotics. He was deescalated to Augmentin and doxycycline to finish out a course. He will continue with serial compression wrappings at discharge to rehab as well as following up with Ortho for further management.  During hospitalization he was started on IV Lasix for diuresis given his severe lower extremity edema. His creatinine continued to slowly uptrend. Urine output measuring was inaccurate and difficult to gauge response. Nephrology was consulted on 09/14/2020 given his uptrending creatinine in setting of multiple antibiotics initially and some transient hypotension.  Renal ultrasound showed no hydronephrosis.  He underwent temporary HD catheter placement by IR on 09/16/2020 and nephrology recommended transfer to [facility] for possible short-term hemodialysis. Noncompliant in general and very self-neglected with denial component. Admitted with horrendous feet/legs. Has osteo in R foot 5th digit. Very well possibly in left foot too chronically.  Too edematous for good ABI's per ortho so getting serial compression wrapping with plans for outpt follow up with ortho (on augmentin/doxy now for OM tx).  Patient was transferred to [facility] 11/3 and dialysis was started.  Subjective: Seen this morning in dialysis.  Alert awake oriented no new complaints. " I am still alive". Asking when he can go home. Episode of hypotension during dialysis. Urine output charted 300 ml, with blood.    Assessment & Plan:  AKI on CKD stage  III with baseline creatinine 1.2-1.3. seen by Nephrology has undergone extensive work-up . AKI is  multifactorial with cardiorenal syndrome, ATN contributing 2/2 sepsis.s/p  temp HD cath by IR 12/2 transferred to [facility] and started on HD:11/3, 11/4,11/5, ultrafiltration limited by hypotension.  Placed on midodrine.  Again dialyzing 11+/7.  Will likely need long-term dialysis, did not respond with a high dose Lasix.  Palliative care following closely- plan for meeting with daughter [name] and family meeting tomorrow to make sure patient understands the implication of being on dialysis and that he is okay with plan moving forward if not then family interested in hospice. Recent Labs  Lab 09/18/20 0202 09/19/20 0407 09/20/20 0503 09/21/20 0321 09/22/20 0301  BUN 85* 52* 39* 49* 63*  CREATININE 6.09* 5.03* 4.79* 6.05* 6.93*   Acute on chronic combined systolic and diastolic CHF: LVEF 45-36% in LVEF, previously back in March EF was 40-45%: TTE shows lvef 30-35%, severe concentric LVH, global hypokinesia.  No significant urine output despite being on high-dose Lasix.  Continue fluid management with dialysis.  Hypotension: During dialysis continue midodrine.  Metoprolol dose decreased.   Chronic lymphedema followed by wound care.  Continue with dressing being changed on Wednesday, wound care has seen the patient  Osteomyelitis of fifth toe of right foot, osteomyelitis per x-ray, ABI indeterminate due to lower extremity edema.  Serial compression to aid with improving edema, orthopedics had seen the patient and patient refused amputation.  Discussed today he reports he is not going to have amputation. Outpatient follow-up.  Initially on IV antibiotics which have been switched to oral Augmentin and doxycycline-May need long-term, will discuss with Dr. Merton Border at home/neuropathy: cont pt/ot  Hypokalemia adjust w/ HD  Severe sepsis IWO:EHOZYY is resolved.  Acute metabolic encephalopathy, uremic/septic encephalopathy.  Mental status appears to be overall stable.   PAF with QMG:[id number]  with meds.metoprolol dose decreased for hypotension continue with holding parameters.  Currently heparin and once no more procedure can switch to Eliquis if okay with nephrology.  Low-dose metoprolol.   Hematuria after placing foley catheter suspect catheter related trauma:monitor closely on heparin if ongoing/not resolving will need to hold heparin.does not have much urine output although had some blood in it but hemoglobin has not decreased and is stable. Recent Labs  Lab 09/18/20 2101 09/19/20 0407 09/20/20 0503 09/21/20 0321 09/22/20 0301  HGB 11.0* 10.5* 11.3* 10.6* 11.0*  HCT 34.8* 33.2* 36.3* 34.0* 36.4*   Type 2 diabetes mellitus: A1c 9.6 10/25.Blood sugar is overall stable. Recent Labs  Lab 09/21/20 1132 09/21/20 1620 09/21/20 2030 09/22/20 0655 09/22/20 1208  GLUCAP 124* 122* 100* 92 86   Morbid obesity with BMI 43>41, will benefit with weight loss/healthy lifestyle and outpatient sleep apnea evaluation.    QXI:HWTUU with his daughter [name] [employer] copy) previously. Overall prognosis remains to be seen.palliative care was consulted family voiced interest in hospice if patient does not go through dialysis but if patient decides to go through dialysis, it will be continued.Plan for family meeting tomorrow following with nephrology/palliative care and daughter.  Wound care: On weekly dressing on [facility] care, nursing care.  Patient had his feet in faces when he was found.  Nutrition: Diet Order            Diet Carb Modified Fluid consistency: Thin; Room service appropriate? Yes  Diet effective now                 Nutrition Problem: Increased nutrient needs Etiology:  wound healing, acute illness Signs/Symptoms: estimated needs Interventions: MVI, Other (Comment) (Prosource Plus)  Body mass index is 41.31 kg/m.  Pressure Ulcer: Pressure Injury 09/08/20 Heel Right Stage 3 -  Full thickness tissue loss. Subcutaneous fat may be visible but bone, tendon or  muscle are NOT exposed. 25% red, 75% slouogh, strong odor, mod brown (Active)  09/08/20   Location: Heel  Location Orientation: Right  Staging: Stage 3 -  Full thickness tissue loss. Subcutaneous fat may be visible but bone, tendon or muscle are NOT exposed.  Wound Description (Comments): 25% red, 75% slouogh, strong odor, mod brown  Present on Admission: Yes     Pressure Injury 09/17/20 Buttocks Right;Left Deep Tissue Pressure Injury - Purple or maroon localized area of discolored intact skin or blood-filled blister due to damage of underlying soft tissue from pressure and/or shear. (Active)  09/17/20 1805  Location: Buttocks  Location Orientation: Right;Left  Staging: Deep Tissue Pressure Injury - Purple or maroon localized area of discolored intact skin or blood-filled blister due to damage of underlying soft tissue from pressure and/or shear.  Wound Description (Comments):   Present on Admission:    DVT prophylaxis: heparin gtt Code Status:  Code Status: Full Code  Family Communication: plan of care discussed with patient along with his daughter at the bedside and I had also spoken with another  Daughter [name] who happens to be Therapist, sports.  Status DG:LOVFIEPPI Remains inpatient appropriate because:IV treatments appropriate due to intensity of illness or inability to take PO and Inpatient level of care appropriate due to severity of illness  Dispo:The patient is from: Home            Anticipated d/c is to: TBD            Anticipated d/c date is: 2-3 days            Patient currently is not medically stable to d/c. Consultants:see note  Procedures:see note  Culture/Microbiology    Component Value Date/Time   SDES  09/08/2020 2103    BLOOD LEFT HAND Performed at [facility], [place] [address] [place], [place] [address]    SPECREQUEST  09/08/2020 2103    BOTTLES DRAWN AEROBIC AND ANAEROBIC Blood Culture adequate volume Performed at [facility] [address] [place], [place] [address]    CULT  09/08/2020 2103    NO GROWTH 5 DAYS Performed at [facility] [address], [place], [place] [address]    REPTSTATUS 09/13/2020 FINAL 09/08/2020 2103    Other culture-see note  Medications: Scheduled Meds: . (feeding supplement) PROSource Plus  30 mL Oral QID  . amoxicillin-clavulanate  1 tablet Oral Daily  . Chlorhexidine Gluconate Cloth  6 each Topical Daily  . doxycycline  100 mg Oral Q12H  . insulin aspart  0-15 Units Subcutaneous TID WC  . insulin aspart  0-5 Units Subcutaneous QHS  . metolazone  5 mg Oral BID  . metoprolol tartrate  25 mg Oral BID  . midodrine  10 mg Oral TID WC   Continuous Infusions: . heparin 1,700 Units/hr (09/22/20 1223)    Antimicrobials: Anti-infectives (From admission, onward)   Start     Dose/Rate Route Frequency Ordered Stop   09/17/20 1000  amoxicillin-clavulanate (AUGMENTIN) 500-125 MG per tablet 500 mg        1 tablet Oral Daily 09/17/20 0839     09/14/20 2200  amoxicillin-clavulanate (AUGMENTIN) 500-125 MG per tablet 500 mg  Status:  Discontinued        1 tablet Oral 2 times daily 09/14/20 1524 09/17/20 0839   09/12/20 1000  amoxicillin-clavulanate (AUGMENTIN) 875-125 MG per tablet 1 tablet  Status:  Discontinued        1 tablet Oral Every 12 hours 09/12/20 0823 09/14/20 1524   09/12/20 1000  doxycycline (VIBRA-TABS) tablet 100 mg        100 mg Oral Every 12 hours 09/12/20 0823     09/10/20 2000  DAPTOmycin (CUBICIN) 740 mg in sodium chloride 0.9 % IVPB  Status:  Discontinued        740 mg 229.6 mL/hr over 30 Minutes Intravenous Daily 09/10/20 1230 09/12/20 0822   09/10/20 1200  metroNIDAZOLE (FLAGYL) IVPB 500 mg  Status:  Discontinued        500 mg 100 mL/hr over 60 Minutes Intravenous Every 8 hours 09/10/20 1050 09/12/20 0822   09/10/20 1132  vancomycin variable dose per unstable renal function (pharmacist dosing)  Status:  Discontinued         Does not apply See admin  instructions 09/10/20 1132 09/10/20 1230   09/09/20 1700  vancomycin (VANCOREADY) IVPB 1500 mg/300 mL  Status:  Discontinued        1,500 mg 150 mL/hr over 120 Minutes Intravenous Every 24 hours 09/08/20 1956 09/10/20 1132   09/09/20 1000  ceFEPIme (MAXIPIME) 2 g in sodium chloride 0.9 % 100 mL IVPB  Status:  Discontinued        2 g 200 mL/hr over 30 Minutes Intravenous Every 12 hours 09/08/20 1947 09/12/20 0822   09/08/20 2200  ceFEPIme (MAXIPIME) 2 g in sodium chloride 0.9 % 100 mL IVPB        2 g 200 mL/hr over 30 Minutes Intravenous  Once 09/08/20 1754 09/08/20 2206   09/08/20 1800  vancomycin (VANCOCIN) IVPB 1000 mg/200 mL premix  Status:  Discontinued        1,000 mg 200 mL/hr over 60 Minutes Intravenous  Once 09/08/20 1754 09/08/20 1756   09/08/20 1630  vancomycin (VANCOREADY) IVPB 2000 mg/400 mL        2,000 mg 200 mL/hr over 120 Minutes Intravenous  Once 09/08/20 1551 09/08/20 1842   09/08/20 1600  piperacillin-tazobactam (ZOSYN) IVPB 3.375 g        3.375 g 100 mL/hr over 30 Minutes Intravenous  Once 09/08/20 1549 09/08/20 1645     Objective: Vitals: Today's Vitals   09/22/20 1010 09/22/20 1130 09/22/20 1138 09/22/20 1211  BP: 99/63 90/60  106/78  Pulse: 62 (!) 47 (!) 44   Resp: 18 17  18   Temp:  98.6 F (37 C)  97.8 F (36.6 C)  TempSrc:  Oral  Oral  SpO2: 94% 95% 95% 99%  Weight:      Height:      PainSc:    0-No pain    Intake/Output Summary (Last 24 hours) at 09/22/2020 1328 Last data filed at 09/22/2020 1130 Gross per 24 hour  Intake 982.62 ml  Output 2200 ml  Net -1217.38 ml   Filed Weights   09/20/20 2048 09/21/20 0500 09/22/20 0750  Weight: 128.5 kg 128.5 kg 126.9 kg   Weight change:   Intake/Output from previous day: 11/07 0701 - 11/08 0700 In: 1674.6 [P.O.:1180; I.V.:278.6; IV Piggyback:216] Out: 300 [Urine:300] Intake/Output this shift: Total I/O In: -  Out: 2000 [Other:2000]  Examination: General exam: AAO, interactive, obese NAD, weak  appearing. HEENT:Oral mucosa moist, Ear/Nose WNL grossly, dentition normal. Respiratory system: bilaterally  clear,no wheezing or crackles,no use of accessory muscle Cardiovascular system: S1 & S2 +, No JVD,. Gastrointestinal system: Abdomen soft, NT,ND, BS+ Nervous System:Alert, awake, moving extremities and grossly nonfocal Extremities: Bilateral extensive lymphedema with Unna boot dressing in place will be changed Wednesday  Skin: No rashes,no icterus. MSK: Normal muscle bulk,tone, power TDC+ on rt chest.  Data Reviewed: I have personally reviewed following labs and imaging studies CBC: Recent Labs  Lab 09/18/20 0202 09/18/20 0202 09/18/20 2101 09/19/20 0407 09/20/20 0503 09/21/20 0321 09/22/20 0301  WBC 12.5*  --   --  9.8 10.5 8.2 8.1  NEUTROABS 9.4*  --   --  6.6 7.1 6.2 5.5  HGB 10.7*   < > 11.0* 10.5* 11.3* 10.6* 11.0*  HCT 34.1*   < > 34.8* 33.2* 36.3* 34.0* 36.4*  MCV 84.0  --   --  86.2 86.2 85.6 88.1  PLT 343  --   --  304 348 299 291   < > = values in this interval not displayed.   Basic Metabolic Panel: Recent Labs  Lab 09/16/20 0456 09/16/20 0456 09/17/20 0600 09/17/20 0600 09/18/20 0202 09/19/20 0407 09/20/20 0503 09/21/20 0321 09/22/20 0301  NA 135   < > 136   < > 136 134* 134* 135 137  K 4.3   < > 4.3   < > 3.7 3.2* 3.5 3.1* 3.9  CL 106   < > 105   < > 104 98 99 101 104  CO2 16*   < > 16*   < > 18* 23 24 21* 19*  GLUCOSE 168*   < > 169*   < > 159* 126* 130* 96 116*  BUN 105*   < > 118*   < > 85* 52* 39* 49* 63*  CREATININE 5.91*   < > 6.79*   < > 6.09* 5.03* 4.79* 6.05* 6.93*  CALCIUM 7.9*   < > 8.1*   < > 8.0* 7.9* 8.3* 8.5* 8.7*  MG 2.0  --  2.2  --  1.7 1.8 1.8  --   --   PHOS 8.0*  --  9.0*  --  7.0* 6.7* 5.7*  --   --    < > = values in this interval not displayed.   GFR: Estimated Creatinine Clearance: 12.9 mL/min (A) (by C-G formula based on SCr of 6.93 mg/dL (H)). Liver Function Tests: No results for input(s): AST, ALT, ALKPHOS,  BILITOT, PROT, ALBUMIN in the last 168 hours. No results for input(s): LIPASE, AMYLASE in the last 168 hours. No results for input(s): AMMONIA in the last 168 hours. Coagulation Profile: No results for input(s): INR, PROTIME in the last 168 hours. Cardiac Enzymes: No results for input(s): CKTOTAL, CKMB, CKMBINDEX, TROPONINI in the last 168 hours. BNP (last 3 results) No results for input(s): PROBNP in the last 8760 hours. HbA1C: No results for input(s): HGBA1C in the last 72 hours. CBG: Recent Labs  Lab 09/21/20 1132 09/21/20 1620 09/21/20 2030 09/22/20 0655 09/22/20 1208  GLUCAP 124* 122* 100* 92 86   Lipid Profile: No results for input(s): CHOL, HDL, LDLCALC, TRIG, CHOLHDL, LDLDIRECT in the last 72 hours. Thyroid Function Tests: No results for input(s): TSH, T4TOTAL, FREET4, T3FREE, THYROIDAB in the last 72 hours. Anemia Panel: No results for input(s): VITAMINB12, FOLATE, FERRITIN, TIBC, IRON, RETICCTPCT in the last 72 hours. Sepsis Labs: No results for input(s): PROCALCITON, LATICACIDVEN in the last 168 hours.  No results found for this or any previous visit (from the  past 240 hour(s)).   Radiology Studies: No results found.   LOS: 14 days   Antonieta Pert, MD [facility]  09/22/2020, 1:28 PM

## 2020-09-22 NOTE — Progress Notes (Addendum)
Physical Therapy Treatment Patient Details Name: [name]. MRN: [id number] DOB: 12/05/1950 Today's Date: 09/22/2020    History of Present Illness 69 y.o. male with PMH of DM2, CHF, afib, HTN, R THA in 2016 presents to ED after being found down in the bathroom this AM during welfare check; xray found osteomyelitis of 5th toe on R foot.    PT Comments    Patient received in recliner, cooperative with therapies but demonstrating significant STM deficits and poor medical literacy, perseverates on walking in his Birkenstocks and going barefoot, also asked [place] what HD is and what it does after he already went this morning, and then told us his heel had healed when this is far from the case.  He has been walking to and from the bathroom with nursing staff WBAT BLEs without PRAFO in his room (RN reports wound nurse does not feel this will affect wound healing process)- assessed this distance but did not progress gait further as feel we need clarification from Dr. Sharol Given regarding WB status since [facility] does not even fit/cannot be used. Left up in recliner with all needs met, chair alarm active. Messaged Dr. Sharol Given for clarification on this case. Agree SNF is most appropriate at this time.    Follow Up Recommendations  SNF     Equipment Recommendations  None recommended by PT    Recommendations for Other Services       Precautions / Restrictions Precautions Precautions: Fall Required Braces or Orthoses: Other Brace Other Brace: R foot PRAFO (in orders, but too small), Unna boots Restrictions Weight Bearing Restrictions: No Other Position/Activity Restrictions: Clarifying with Dr. Sharol Given for WB restrictions    Mobility  Bed Mobility               General bed mobility comments: sitting in recliner on entry  Transfers Overall transfer level: Needs assistance Equipment used: Rolling walker (2 wheeled) Transfers: Sit to/from Stand Sit to Stand: Supervision         General  transfer comment: Supervision for sit to stand transfer with RW, no LOB noted  Ambulation/Gait Ambulation/Gait assistance: Supervision Gait Distance (Feet): 14 Feet Assistive device: Rolling walker (2 wheeled) Gait Pattern/deviations: Step-through pattern;Decreased stride length Gait velocity: decreased   General Gait Details: walking PRAFO did not fit, able to get socks on today and steady with RW   Stairs             Wheelchair Mobility    Modified Rankin (Stroke Patients Only)       Balance Overall balance assessment: Needs assistance Sitting-balance support: No upper extremity supported;Feet supported Sitting balance-Leahy Scale: Good     Standing balance support: Bilateral upper extremity supported;During functional activity Standing balance-Leahy Scale: Poor Standing balance comment: reliant on UE support                            Cognition Arousal/Alertness: Awake/alert Behavior During Therapy: WFL for tasks assessed/performed Overall Cognitive Status: No family/caregiver present to determine baseline cognitive functioning Area of Impairment: Memory;Safety/judgement;Awareness                     Memory: Decreased recall of precautions   Safety/Judgement: Decreased awareness of safety;Decreased awareness of deficits Awareness: Emergent   General Comments: A&O but with very large impairment in awareness of seriousnes of medical complexities (HD, wounds, osteomyeltiis and risk of sepsis/death with refusal of amputation); asked what HD is and what does  it do, also told us heel had already healed and he doesn't need to worry about it      Exercises      General Comments General comments (skin integrity, edema, etc.): Secure chat sent to Dr. Sharol Given in collaboration with PT. If pt able to WBAT B LE, pt able to mobilize safely at home. However, if WB precautions needed for wound healing, pt will require increased assistance and would need  further rehab.      Pertinent Vitals/Pain Pain Assessment: No/denies pain    Home Living Family/patient expects to be discharged to:: Private residence Living Arrangements: Spouse/significant other Available Help at Discharge: Family;Available PRN/intermittently (daughter is a Marine scientist) Type of Home: House Home Access: Stairs to enter Entrance Stairs-Rails: None Home Layout: Able to live on main level with bedroom/bathroom;Two level Home Equipment: Walker - 2 wheels;Cane - single point;Bedside commode      Prior Function Level of Independence: Independent      Comments: Pt reports independent with ADLs, ambulates community distances with RW or shopping cart, 1 fall in last 6 months, drives.   PT Goals (current goals can now be found in the care plan section) Acute Rehab PT Goals Patient Stated Goal: "go back home" PT Goal Formulation: With patient Time For Goal Achievement: 09/25/20 Potential to Achieve Goals: Good Progress towards PT goals: Progressing toward goals    Frequency    Min 3X/week      PT Plan Current plan remains appropriate    Co-evaluation   Reason for Co-Treatment: Complexity of the patient's impairments (multi-system involvement);For patient/therapist safety;To address functional/ADL transfers (avoid overfatiguing from dialysis)   OT goals addressed during session: ADL's and self-care      AM-PAC PT "6 Clicks" Mobility   Outcome Measure  Help needed turning from your back to your side while in a flat bed without using bedrails?: A Little Help needed moving from lying on your back to sitting on the side of a flat bed without using bedrails?: A Little Help needed moving to and from a bed to a chair (including a wheelchair)?: A Little Help needed standing up from a chair using your arms (e.g., wheelchair or bedside chair)?: A Little Help needed to walk in hospital room?: A Little Help needed climbing 3-5 steps with a railing? : A Little 6 Click  Score: 18    End of Session Equipment Utilized During Treatment: Gait belt Activity Tolerance: Patient tolerated treatment well Patient left: in chair;with call bell/phone within reach;with chair alarm set Nurse Communication: Mobility status;Other (comment) (poor awareness of health status and risks of not taking care of health/getting amputation) PT Visit Diagnosis: Unsteadiness on feet (R26.81);Other abnormalities of gait and mobility (R26.89);Muscle weakness (generalized) (M62.81)     Time: 6812-7517 PT Time Calculation (min) (ACUTE ONLY): 34 min  Charges:  $Gait Training: 8-22 mins  (co-tx with OT)                     Windell Norfolk, DPT, PN1   Supplemental Physical Therapist [place]    Pager [phone] Acute Rehab Office [phone]

## 2020-09-23 ENCOUNTER — Encounter (HOSPITAL_COMMUNITY): Payer: Medicare Other

## 2020-09-23 DIAGNOSIS — N186 End stage renal disease: Secondary | ICD-10-CM

## 2020-09-23 DIAGNOSIS — M869 Osteomyelitis, unspecified: Secondary | ICD-10-CM | POA: Diagnosis not present

## 2020-09-23 DIAGNOSIS — Z7189 Other specified counseling: Secondary | ICD-10-CM | POA: Diagnosis not present

## 2020-09-23 DIAGNOSIS — Z992 Dependence on renal dialysis: Secondary | ICD-10-CM

## 2020-09-23 DIAGNOSIS — Z515 Encounter for palliative care: Secondary | ICD-10-CM | POA: Diagnosis not present

## 2020-09-23 DIAGNOSIS — N179 Acute kidney failure, unspecified: Secondary | ICD-10-CM | POA: Diagnosis not present

## 2020-09-23 LAB — GLUCOSE, CAPILLARY
Glucose-Capillary: 98 mg/dL (ref 70–99)
Glucose-Capillary: 104 mg/dL — ABNORMAL HIGH (ref 70–99)
Glucose-Capillary: 106 mg/dL — ABNORMAL HIGH (ref 70–99)
Glucose-Capillary: 114 mg/dL — ABNORMAL HIGH (ref 70–99)

## 2020-09-23 LAB — BASIC METABOLIC PANEL
Anion gap: 13 (ref 5–15)
BUN: 52 mg/dL — ABNORMAL HIGH (ref 8–23)
CO2: 22 mmol/L (ref 22–32)
Calcium: 8.3 mg/dL — ABNORMAL LOW (ref 8.9–10.3)
Chloride: 102 mmol/L (ref 98–111)
Creatinine, Ser: 6.15 mg/dL — ABNORMAL HIGH (ref 0.61–1.24)
GFR, Estimated: 9 mL/min — ABNORMAL LOW (ref >60)
Glucose, Bld: 110 mg/dL — ABNORMAL HIGH (ref 70–99)
Potassium: 3.6 mmol/L (ref 3.5–5.1)
Sodium: 137 mmol/L (ref 135–145)

## 2020-09-23 LAB — CBC WITH DIFFERENTIAL/PLATELET
Abs Immature Granulocytes: 0.05 10*3/uL (ref 0.00–0.07)
Basophils Absolute: 0 10*3/uL (ref 0.0–0.1)
Basophils Relative: 1 %
Eosinophils Absolute: 0 10*3/uL (ref 0.0–0.5)
Eosinophils Relative: 1 %
HCT: 36.5 % — ABNORMAL LOW (ref 39.0–52.0)
Hemoglobin: 11 g/dL — ABNORMAL LOW (ref 13.0–17.0)
Immature Granulocytes: 1 %
Lymphocytes Relative: 25 %
Lymphs Abs: 1.6 10*3/uL (ref 0.7–4.0)
MCH: 26.7 pg (ref 26.0–34.0)
MCHC: 30.1 g/dL (ref 30.0–36.0)
MCV: 88.6 fL (ref 80.0–100.0)
Monocytes Absolute: 1 10*3/uL (ref 0.1–1.0)
Monocytes Relative: 15 %
Neutro Abs: 3.7 10*3/uL (ref 1.7–7.7)
Neutrophils Relative %: 57 %
Platelets: 252 10*3/uL (ref 150–400)
RBC: 4.12 MIL/uL — ABNORMAL LOW (ref 4.22–5.81)
RDW: 15.6 % — ABNORMAL HIGH (ref 11.5–15.5)
WBC: 6.5 10*3/uL (ref 4.0–10.5)
nRBC: 0 % (ref 0.0–0.2)

## 2020-09-23 LAB — HEPARIN LEVEL (UNFRACTIONATED): Heparin Unfractionated: 0.74 IU/mL — ABNORMAL HIGH (ref 0.30–0.70)

## 2020-09-23 LAB — HEMOGLOBIN AND HEMATOCRIT, BLOOD
HCT: 35 % — ABNORMAL LOW (ref 39.0–52.0)
Hemoglobin: 10.5 g/dL — ABNORMAL LOW (ref 13.0–17.0)

## 2020-09-23 MED ORDER — HEPARIN (PORCINE) 25000 UT/250ML-% IV SOLN
1500.0000 [IU]/h | INTRAVENOUS | Status: DC
  Filled 2020-09-23: qty 250

## 2020-09-23 MED ORDER — RENA-VITE PO TABS
1.0000 | ORAL_TABLET | Freq: Every day | ORAL | Status: DC
  Administered 2020-09-23 – 2020-09-30 (×8): 1 via ORAL
  Filled 2020-09-23 (×8): qty 1

## 2020-09-23 MED ORDER — KIDNEY FAILURE BOOK: Freq: Once | Status: AC

## 2020-09-23 MED ORDER — ENSURE MAX PROTEIN PO LIQD
11.0000 [oz_av] | Freq: Two times a day (BID) | ORAL | Status: DC
  Administered 2020-09-25 – 2020-09-30 (×11): 11 [oz_av] via ORAL
  Filled 2020-09-23 (×15): qty 330

## 2020-09-23 NOTE — Plan of Care (Signed)
°  Problem: Coping: °Goal: Level of anxiety will decrease °Outcome: Progressing °  °

## 2020-09-23 NOTE — Consult Note (Addendum)
Hospital Consult    Reason for Consult: End-stage renal disease Requesting Physician:  Dr. Carolin Sicks MRN #:  [id number]  History of Present Illness: This is a 69 y.o. male who was admitted several days ago after being found down at home.  His medical history is significant for chronic lower extremity edema, venous insufficiency, diabetes mellitus, atrial fibrillation and morbid obesity.  He is interviewed and examined on the medical floor where he is awake, alert and in no apparent distress.  He is out of bed to chair.  He is without physical complaints currently.  He is somewhat confused about his current medical condition and plans for hemodialysis.  He could not recall whether he has had at hemodialysis treatment or not.  He is diagnosed with acute kidney injury chronic kidney disease stage IIIb.  No sign of renal recovery currently.  He dialyzed yesterday.    He was seen in our office 1 year ago by Dr. Donzetta Matters for venous insufficiency.  He is right-hand dominant.  No prior history of pacemaker implantation.  A right IJ temporary dialysis catheter is in place.  The pt is not on a statin for cholesterol management.  The pt not on a daily aspirin.   Other AC: Eliquis The pt is on beta-blocker, torsemide for hypertension.   The pt is diabetic.   Tobacco hx: Prior smokeless tobacco use  Past Medical History:  Diagnosis Date  . Arthritis   . Back pain    occasionally  . BPH (benign prostatic hypertrophy)    takes Uroxatral daily as well as Finasteride   . Diabetes mellitus without complication ([place])    takes Metformin daily  . Diabetic foot infection ([place]) 10/22/2019  . GERD (gastroesophageal reflux disease)    no meds  . Hepatitis hx of   at age 86  . Hypertension    takes Losartan and HCTZ daily  . Joint pain   . Lymphedema 10/22/2019  . Urinary frequency   . Urinary urgency   . Venous stasis dermatitis 11/19/2019    Past Surgical History:  Procedure Laterality Date  .  APPENDECTOMY     age 48  . COLONOSCOPY    . IR FLUORO GUIDE CV LINE RIGHT  09/16/2020  . IR US GUIDE VASC ACCESS RIGHT  09/16/2020  . KNEE ARTHROSCOPY  1984   right and left   . SHOULDER ARTHROSCOPY  2003   right RCR-GSC-stayed overnight-had SOB  . SHOULDER ARTHROSCOPY WITH ROTATOR CUFF REPAIR AND SUBACROMIAL DECOMPRESSION Left 03/13/2013   Procedure: LEFT SHOULDER ARTHROSCOPY WITH SUBACROMIAL DECOMPRESSION, DISTAL CLAVICLE RESECTION AND REPAIR ROTATOR CUFF AND LABRAL DEBRIDEMENT;  Surgeon: [name]., MD;  Location: [facility];  Service: Orthopedics;  Laterality: Left;  . TOTAL HIP ARTHROPLASTY Right 01/29/2015   Procedure: TOTAL HIP ARTHROPLASTY ANTERIOR APPROACH;  Surgeon: [name], MD;  Location: [facility];  Service: Orthopedics;  Laterality: Right;  Marland Kitchen VASECTOMY    . VASECTOMY REVERSAL  1993    Allergies  Allergen Reactions  . Codeine Swelling    Water retention  . Sulfur Other (See Comments)    Not sure of reaction a young child    Prior to Admission medications   Medication Sig Start Date End Date Taking? Authorizing Provider  apixaban (ELIQUIS) 5 MG TABS tablet Take 1 tablet (5 mg total) by mouth 2 (two) times daily. Patient not taking: Reported on 09/08/2020 02/06/20 03/07/20  Marianna Payment, MD  metoprolol tartrate (LOPRESSOR) 50 MG tablet Take 1  tablet (50 mg total) by mouth 2 (two) times daily for 30 doses. Patient not taking: Reported on 09/08/2020 02/06/20 02/21/20  Marianna Payment, MD  potassium chloride SA (KLOR-CON) 20 MEQ tablet Take 1 tablet by mouth daily Patient not taking: Reported on 09/08/2020 09/18/19   Richardson Dopp T, PA-C  torsemide (DEMADEX) 20 MG tablet Take 2 tablets (40 mg total) by mouth 2 (two) times daily. Patient not taking: Reported on 09/08/2020 02/06/20 03/07/20  Marianna Payment, MD    Social History   Socioeconomic History  . Marital status: Married    Spouse name: Not on file  . Number of children: 3  . Years of education: Not  on file  . Highest education level: Not on file  Occupational History  . Not on file  Tobacco Use  . Smoking status: Never Smoker  . Smokeless tobacco: Former Systems developer    Types: Secondary school teacher  . Vaping Use: Never used  Substance and Sexual Activity  . Alcohol use: Yes    Alcohol/week: 0.0 standard drinks    Comment: occasionally beer or scotch  . Drug use: No  . Sexual activity: Yes  Other Topics Concern  . Not on file  Social History Narrative  . Not on file   Social Determinants of Health   Financial Resource Strain:   . Difficulty of Paying Living Expenses: Not on file  Food Insecurity:   . Worried About Charity fundraiser in the Last Year: Not on file  . Ran Out of Food in the Last Year: Not on file  Transportation Needs:   . Lack of Transportation (Medical): Not on file  . Lack of Transportation (Non-Medical): Not on file  Physical Activity:   . Days of Exercise per Week: Not on file  . Minutes of Exercise per Session: Not on file  Stress:   . Feeling of Stress : Not on file  Social Connections:   . Frequency of Communication with Friends and Family: Not on file  . Frequency of Social Gatherings with Friends and Family: Not on file  . Attends Religious Services: Not on file  . Active Member of Clubs or Organizations: Not on file  . Attends Archivist Meetings: Not on file  . Marital Status: Not on file  Intimate Partner Violence:   . Fear of Current or Ex-Partner: Not on file  . Emotionally Abused: Not on file  . Physically Abused: Not on file  . Sexually Abused: Not on file     Family History  Problem Relation Age of Onset  . Benign prostatic hyperplasia Father   . Kidney cancer Neg Hx   . Kidney disease Neg Hx     ROS: [x]  Positive   [ ]  Negative   [ ]  All sytems reviewed and are negative  Cardiac: []  chest pain/pressure []  palpitations []  SOB lying flat []  DOE  Vascular: []  pain in legs while walking []  pain in legs at rest []   pain in legs at night []  non-healing ulcers []  hx of DVT [x]  swelling in legs  Pulmonary: []  productive cough []  asthma/wheezing []  home O2  Neurologic: []  weakness in []  arms []  legs []  numbness in []  arms []  legs []  hx of CVA []  mini stroke [] difficulty speaking or slurred speech []  temporary loss of vision in one eye []  dizziness  Hematologic: []  hx of cancer []  bleeding problems []  problems with blood clotting easily  Endocrine:   [x]  diabetes []  thyroid  disease  GI []  vomiting blood []  blood in stool  GU: [x]  CKD/renal failure []  HD--[]  M/W/F or []  T/T/S []  burning with urination [x]  blood in urine  Psychiatric: []  anxiety []  depression  Musculoskeletal: []  arthritis []  joint pain  Integumentary: []  rashes []  ulcers  Constitutional: []  fever []  chills   Physical Examination  Vitals:   09/23/20 0429 09/23/20 0845  BP: (!) 87/75 115/72  Pulse: (!) 52 (!) 108  Resp:  18  Temp: 97.8 F (36.6 C) 98.5 F (36.9 C)  SpO2: 95%    Body mass index is 41.31 kg/m.  General:  WDWN in NAD Gait: Not observed HENT: WNL, normocephalic Pulmonary: normal non-labored breathing, without Rales, rhonchi,  wheezing Cardiac: regular, without  Murmurs, rubs or gallops; Abdomen: obese,ND, Skin: without rashes Extremities: Lower extremity edema apparent.  Unna boot type dressings in place.  Bilateral upper extremity peripheral IVs in place.  2+ right radial pulse.  2+ right brachial pulse.  5 out of 5 hand grip strength Musculoskeletal: no muscle wasting or atrophy  Neurologic: A&O X 3;  No focal weakness or paresthesias are detected; speech is fluent/normal Psychiatric:  The pt has Normal affect.   CBC    Component Value Date/Time   WBC 6.5 09/23/2020 0340   RBC 4.12 (L) 09/23/2020 0340   HGB 10.5 (L) 09/23/2020 1127   HCT 35.0 (L) 09/23/2020 1127   PLT 252 09/23/2020 0340   MCV 88.6 09/23/2020 0340   MCH 26.7 09/23/2020 0340   MCHC 30.1 09/23/2020  0340   RDW 15.6 (H) 09/23/2020 0340   LYMPHSABS 1.6 09/23/2020 0340   MONOABS 1.0 09/23/2020 0340   EOSABS 0.0 09/23/2020 0340   BASOSABS 0.0 09/23/2020 0340    BMET    Component Value Date/Time   NA 137 09/23/2020 0340   NA 142 07/20/2018 1042   K 3.6 09/23/2020 0340   CL 102 09/23/2020 0340   CO2 22 09/23/2020 0340   GLUCOSE 110 (H) 09/23/2020 0340   BUN 52 (H) 09/23/2020 0340   BUN 25 07/20/2018 1042   CREATININE 6.15 (H) 09/23/2020 0340   CREATININE 1.39 (H) 10/22/2019 0941   CALCIUM 8.3 (L) 09/23/2020 0340   GFRNONAA 9 (L) 09/23/2020 0340   GFRNONAA 51 (L) 10/22/2019 0941   GFRAA 60 (L) 02/06/2020 0524   GFRAA 59 (L) 10/22/2019 0941    COAGS: Lab Results  Component Value Date   INR 1.1 01/31/2020   INR 0.99 01/17/2015     Non-Invasive Vascular Imaging:   Bilateral upper extremity vein mapping order is in place.  Exam pending   ASSESSMENT/PLAN: This is a 69 y.o. male who presents with acute kidney injury atop chronic kidney disease stage IIIb.  We are asked to evaluate for permanent hemodialysis access.  He is right-hand dominant.  I discussed hemodialysis access including tunneled hemodialysis catheter as well as arteriovenous fistula.  Discussed the nature of AV fistula procedure and maturation.  Discussed risks of the procedure that include but are not limited to, bleeding, infection, nerve injury, steal syndrome, failure to mature.  -Dr. Donnetta Hutching, vascular surgeon on-call has seen and evaluated the patient also.  We will have the left peripheral IV discontinued, limb restriction band placed.  Plan left upper extremity AV fistula versus AV graft placement tomorrow as well as tunneled dialysis catheter placement.  Risa Grill, PA-C [facility] [phone]  I have examined the patient, reviewed and agree with above. Discussed options of hemodialysis the patient. He  does have moderate size cephalic vein in the left forearm however does have  multiple venous catheter sites in this and also has a IV in his cephalic vein at the antecubital space. Will DC these. Has similar anatomy on the right arm on the surface. Will restrict his left arm. We will plan tunneled hemodialysis catheter and left arm AV graft versus AV fistula tomorrow.  Curt Jews, MD 09/23/2020 3:34 PM

## 2020-09-23 NOTE — H&P (View-Only) (Signed)
Hospital Consult    Reason for Consult: End-stage renal disease Requesting Physician:  Dr. Carolin Sicks MRN #:  [id number]  History of Present Illness: This is a 69 y.o. male who was admitted several days ago after being found down at home.  His medical history is significant for chronic lower extremity edema, venous insufficiency, diabetes mellitus, atrial fibrillation and morbid obesity.  He is interviewed and examined on the medical floor where he is awake, alert and in no apparent distress.  He is out of bed to chair.  He is without physical complaints currently.  He is somewhat confused about his current medical condition and plans for hemodialysis.  He could not recall whether he has had at hemodialysis treatment or not.  He is diagnosed with acute kidney injury chronic kidney disease stage IIIb.  No sign of renal recovery currently.  He dialyzed yesterday.    He was seen in our office 1 year ago by Dr. Donzetta Matters for venous insufficiency.  He is right-hand dominant.  No prior history of pacemaker implantation.  A right IJ temporary dialysis catheter is in place.  The pt is not on a statin for cholesterol management.  The pt not on a daily aspirin.   Other AC: Eliquis The pt is on beta-blocker, torsemide for hypertension.   The pt is diabetic.   Tobacco hx: Prior smokeless tobacco use  Past Medical History:  Diagnosis Date  . Arthritis   . Back pain    occasionally  . BPH (benign prostatic hypertrophy)    takes Uroxatral daily as well as Finasteride   . Diabetes mellitus without complication ([place])    takes Metformin daily  . Diabetic foot infection ([place]) 10/22/2019  . GERD (gastroesophageal reflux disease)    no meds  . Hepatitis hx of   at age 40  . Hypertension    takes Losartan and HCTZ daily  . Joint pain   . Lymphedema 10/22/2019  . Urinary frequency   . Urinary urgency   . Venous stasis dermatitis 11/19/2019    Past Surgical History:  Procedure Laterality Date  .  APPENDECTOMY     age 40  . COLONOSCOPY    . IR FLUORO GUIDE CV LINE RIGHT  09/16/2020  . IR US GUIDE VASC ACCESS RIGHT  09/16/2020  . KNEE ARTHROSCOPY  1984   right and left   . SHOULDER ARTHROSCOPY  2003   right RCR-GSC-stayed overnight-had SOB  . SHOULDER ARTHROSCOPY WITH ROTATOR CUFF REPAIR AND SUBACROMIAL DECOMPRESSION Left 03/13/2013   Procedure: LEFT SHOULDER ARTHROSCOPY WITH SUBACROMIAL DECOMPRESSION, DISTAL CLAVICLE RESECTION AND REPAIR ROTATOR CUFF AND LABRAL DEBRIDEMENT;  Surgeon: [name]., MD;  Location: [facility];  Service: Orthopedics;  Laterality: Left;  . TOTAL HIP ARTHROPLASTY Right 01/29/2015   Procedure: TOTAL HIP ARTHROPLASTY ANTERIOR APPROACH;  Surgeon: [name], MD;  Location: [facility];  Service: Orthopedics;  Laterality: Right;  Marland Kitchen VASECTOMY    . VASECTOMY REVERSAL  1993    Allergies  Allergen Reactions  . Codeine Swelling    Water retention  . Sulfur Other (See Comments)    Not sure of reaction a young child    Prior to Admission medications   Medication Sig Start Date End Date Taking? Authorizing Provider  apixaban (ELIQUIS) 5 MG TABS tablet Take 1 tablet (5 mg total) by mouth 2 (two) times daily. Patient not taking: Reported on 09/08/2020 02/06/20 03/07/20  Marianna Payment, MD  metoprolol tartrate (LOPRESSOR) 50 MG tablet Take 1  tablet (50 mg total) by mouth 2 (two) times daily for 30 doses. Patient not taking: Reported on 09/08/2020 02/06/20 02/21/20  Marianna Payment, MD  potassium chloride SA (KLOR-CON) 20 MEQ tablet Take 1 tablet by mouth daily Patient not taking: Reported on 09/08/2020 09/18/19   Richardson Dopp T, PA-C  torsemide (DEMADEX) 20 MG tablet Take 2 tablets (40 mg total) by mouth 2 (two) times daily. Patient not taking: Reported on 09/08/2020 02/06/20 03/07/20  Marianna Payment, MD    Social History   Socioeconomic History  . Marital status: Married    Spouse name: Not on file  . Number of children: 3  . Years of education: Not  on file  . Highest education level: Not on file  Occupational History  . Not on file  Tobacco Use  . Smoking status: Never Smoker  . Smokeless tobacco: Former Systems developer    Types: Secondary school teacher  . Vaping Use: Never used  Substance and Sexual Activity  . Alcohol use: Yes    Alcohol/week: 0.0 standard drinks    Comment: occasionally beer or scotch  . Drug use: No  . Sexual activity: Yes  Other Topics Concern  . Not on file  Social History Narrative  . Not on file   Social Determinants of Health   Financial Resource Strain:   . Difficulty of Paying Living Expenses: Not on file  Food Insecurity:   . Worried About Charity fundraiser in the Last Year: Not on file  . Ran Out of Food in the Last Year: Not on file  Transportation Needs:   . Lack of Transportation (Medical): Not on file  . Lack of Transportation (Non-Medical): Not on file  Physical Activity:   . Days of Exercise per Week: Not on file  . Minutes of Exercise per Session: Not on file  Stress:   . Feeling of Stress : Not on file  Social Connections:   . Frequency of Communication with Friends and Family: Not on file  . Frequency of Social Gatherings with Friends and Family: Not on file  . Attends Religious Services: Not on file  . Active Member of Clubs or Organizations: Not on file  . Attends Archivist Meetings: Not on file  . Marital Status: Not on file  Intimate Partner Violence:   . Fear of Current or Ex-Partner: Not on file  . Emotionally Abused: Not on file  . Physically Abused: Not on file  . Sexually Abused: Not on file     Family History  Problem Relation Age of Onset  . Benign prostatic hyperplasia Father   . Kidney cancer Neg Hx   . Kidney disease Neg Hx     ROS: [x]  Positive   [ ]  Negative   [ ]  All sytems reviewed and are negative  Cardiac: []  chest pain/pressure []  palpitations []  SOB lying flat []  DOE  Vascular: []  pain in legs while walking []  pain in legs at rest []   pain in legs at night []  non-healing ulcers []  hx of DVT [x]  swelling in legs  Pulmonary: []  productive cough []  asthma/wheezing []  home O2  Neurologic: []  weakness in []  arms []  legs []  numbness in []  arms []  legs []  hx of CVA []  mini stroke [] difficulty speaking or slurred speech []  temporary loss of vision in one eye []  dizziness  Hematologic: []  hx of cancer []  bleeding problems []  problems with blood clotting easily  Endocrine:   [x]  diabetes []  thyroid  disease  GI []  vomiting blood []  blood in stool  GU: [x]  CKD/renal failure []  HD--[]  M/W/F or []  T/T/S []  burning with urination [x]  blood in urine  Psychiatric: []  anxiety []  depression  Musculoskeletal: []  arthritis []  joint pain  Integumentary: []  rashes []  ulcers  Constitutional: []  fever []  chills   Physical Examination  Vitals:   09/23/20 0429 09/23/20 0845  BP: (!) 87/75 115/72  Pulse: (!) 52 (!) 108  Resp:  18  Temp: 97.8 F (36.6 C) 98.5 F (36.9 C)  SpO2: 95%    Body mass index is 41.31 kg/m.  General:  WDWN in NAD Gait: Not observed HENT: WNL, normocephalic Pulmonary: normal non-labored breathing, without Rales, rhonchi,  wheezing Cardiac: regular, without  Murmurs, rubs or gallops; Abdomen: obese,ND, Skin: without rashes Extremities: Lower extremity edema apparent.  Unna boot type dressings in place.  Bilateral upper extremity peripheral IVs in place.  2+ right radial pulse.  2+ right brachial pulse.  5 out of 5 hand grip strength Musculoskeletal: no muscle wasting or atrophy  Neurologic: A&O X 3;  No focal weakness or paresthesias are detected; speech is fluent/normal Psychiatric:  The pt has Normal affect.   CBC    Component Value Date/Time   WBC 6.5 09/23/2020 0340   RBC 4.12 (L) 09/23/2020 0340   HGB 10.5 (L) 09/23/2020 1127   HCT 35.0 (L) 09/23/2020 1127   PLT 252 09/23/2020 0340   MCV 88.6 09/23/2020 0340   MCH 26.7 09/23/2020 0340   MCHC 30.1 09/23/2020  0340   RDW 15.6 (H) 09/23/2020 0340   LYMPHSABS 1.6 09/23/2020 0340   MONOABS 1.0 09/23/2020 0340   EOSABS 0.0 09/23/2020 0340   BASOSABS 0.0 09/23/2020 0340    BMET    Component Value Date/Time   NA 137 09/23/2020 0340   NA 142 07/20/2018 1042   K 3.6 09/23/2020 0340   CL 102 09/23/2020 0340   CO2 22 09/23/2020 0340   GLUCOSE 110 (H) 09/23/2020 0340   BUN 52 (H) 09/23/2020 0340   BUN 25 07/20/2018 1042   CREATININE 6.15 (H) 09/23/2020 0340   CREATININE 1.39 (H) 10/22/2019 0941   CALCIUM 8.3 (L) 09/23/2020 0340   GFRNONAA 9 (L) 09/23/2020 0340   GFRNONAA 51 (L) 10/22/2019 0941   GFRAA 60 (L) 02/06/2020 0524   GFRAA 59 (L) 10/22/2019 0941    COAGS: Lab Results  Component Value Date   INR 1.1 01/31/2020   INR 0.99 01/17/2015     Non-Invasive Vascular Imaging:   Bilateral upper extremity vein mapping order is in place.  Exam pending   ASSESSMENT/PLAN: This is a 69 y.o. male who presents with acute kidney injury atop chronic kidney disease stage IIIb.  We are asked to evaluate for permanent hemodialysis access.  He is right-hand dominant.  I discussed hemodialysis access including tunneled hemodialysis catheter as well as arteriovenous fistula.  Discussed the nature of AV fistula procedure and maturation.  Discussed risks of the procedure that include but are not limited to, bleeding, infection, nerve injury, steal syndrome, failure to mature.  -Dr. Donnetta Hutching, vascular surgeon on-call has seen and evaluated the patient also.  We will have the left peripheral IV discontinued, limb restriction band placed.  Plan left upper extremity AV fistula versus AV graft placement tomorrow as well as tunneled dialysis catheter placement.  Risa Grill, PA-C [facility] [phone]  I have examined the patient, reviewed and agree with above. Discussed options of hemodialysis the patient. He  does have moderate size cephalic vein in the left forearm however does have  multiple venous catheter sites in this and also has a IV in his cephalic vein at the antecubital space. Will DC these. Has similar anatomy on the right arm on the surface. Will restrict his left arm. We will plan tunneled hemodialysis catheter and left arm AV graft versus AV fistula tomorrow.  Curt Jews, MD 09/23/2020 3:34 PM

## 2020-09-23 NOTE — Progress Notes (Signed)
This chaplain is responding to PMT referral. The chaplain is present with Pt. and Pt. daughter-Meredith  for Advance Directive: [place] Will education and notarization.    With the notary and witnesses present, The Pt. named [name] as his Healthcare Agent and [name] as the person if [employer] is unwilling or unable to serve as the Pt. Designer, television/film set.  The Pt. was given the original Advance Directive and two copies.  A copy of the AD was scanned to EMR.    The chaplain is available for F/U spiritual care as needed.

## 2020-09-23 NOTE — Progress Notes (Addendum)
Nutrition Follow-up  DOCUMENTATION CODES:   Obesity unspecified  INTERVENTION:   Ensure Max po BID, each supplement provides 150 kcal and 30 grams of protein  Rena-vit daily  Continue 82ml Prosource Plus po QID, each supplement provides 100 kcals and 15 grams of protein  Provided renal diet education   NUTRITION DIAGNOSIS:   Increased nutrient needs related to wound healing, acute illness as evidenced by estimated needs.  ongoing  GOAL:   Patient will meet greater than or equal to 90% of their needs  progressing  MONITOR:   PO intake, Supplement acceptance, Labs, Weight trends, I & O's  REASON FOR ASSESSMENT:   Consult Diet education  ASSESSMENT:   69 y.o. male with medical history significant of type 2 DM, CHF, afib on eliquis, and HTN. He presented to the ED after being found down in the bathroom during a welfare check. Patient was noted to have unsanitary living condition and is currently followed by APS. He reported falling upon standing without LOC. He fell on his R chest which resulted in R lower chest pain. He reported to ED staff that he stopped taking all of his medications several months ago (except furosemide) because "I didn't think I needed them anymore."  Pt has undergone multiple HD sessions with no renal recovery. Pt has been seen by PMT and there are ongoing discussions involving family members. Pt wanted to proceed with HD on outpatient basis. RD consulted to provide renal diet education. Pt unavailable at time of RD visit. Attached educational material to discharge summary.   Pt has been receiving 65ml Prosource Plus QID, which he has been consuming well per RN. Will continue with current nutrition plan of care and add renal MVI and additional oral nutrition supplement given pt's increased nutrient needs.  PO Intake: 25-100% x last 8 recorded meals (87.5% average meal intake)  UOP: 23ml x24 hours  Last HD 11/8, net UF 2L  Labs: CBGs  98-104 Medications: ss novolog  Diet Order:   Diet Order            Diet NPO time specified  Diet effective midnight           Diet Carb Modified Fluid consistency: Thin; Room service appropriate? Yes  Diet effective now                 EDUCATION NEEDS:   Not appropriate for education at this time  Skin:  Skin Assessment: Skin Integrity Issues: Skin Integrity Issues:: Stage III, DTI, Other (Comment), Diabetic Ulcer DTI: R/L buttocks Stage III: R heel Unstageable: N/A Diabetic Ulcer: Necrotic Diabetic ulcer beneath R 5th metatarsal head Other: Non-pressure wound to R foot and penis; Right anterior calf with red moist partial thickness wound  Last BM:  11/8  Height:   Ht Readings from Last 1 Encounters:  09/08/20 5\' 9"  (1.753 m)    Weight:   Wt Readings from Last 1 Encounters:  09/22/20 126.9 kg     BMI:  Body mass index is 41.31 kg/m.  Estimated Nutritional Needs:   Kcal:  2600-2800  Protein:  160-180 grams  Fluid:  1L + UOP    Larkin Ina, MS, RD, LDN RD pager number and weekend/on-call pager number located in [place].

## 2020-09-23 NOTE — Progress Notes (Signed)
Palliative:  HPI:69 year old male with Y9WK, chronic systolic and diastolic CHF, PAF supposed to be on Eliquis, HTN, morbid obesity brought to ED 09/08/2020 after he was found down in his house.Palliative care was asked to get involved to introduce concepts of service in the setting of multiple comorbid conditions. Patient newly initiated on hemodialysis.   I met today with Mr. Yuan along with daughter, [name]. Dr. Carolin Sicks and Dr. Lupita Leash joined at bedside to discuss plan and answer questions. Colleen, renal navigator, also came to answer questions. After all explanations Mr. Rengel clearly expresses desire for dialysis if this is what he needs. He wants to live and is open to all measures to make this happen. His only limitations currently is that he does not want amputation. However, when discussing if amputation is needed vs end of life he wavers. We completed HCPOA/Living Will. He elects [name] for HCPOA. He would NOT want life prolonged with extraordinary measures if in a comatose state with no hope of recovery or if he has advanced dementia. He would like resuscitation attempt. He would not want to be left on life support long term. He trusts [name] on when to make the call to remove him from life support. He would want extraordinary measures attempted even with terminal illness at this point. He would never want a feeding tube. I do fear that he has somewhat unrealistic expectations that his life will return to his previous quality of life. His quality of life depends on many factors including tolerance of dialysis, progression of heart failure, and progression of osteomyelitis of toe. He has good overall understanding of his condition in this discussion but he has poor memory from day to day (does not recall dialysis yesterday). He also has history of poor compliance with medications and diet in the past which can complicate his situation. We discussed continuing to take one day at a time and see how things  go for him. [name] has good understanding.    All questions/concerns addressed. Emotional support provided. I left Mr. Berendt and [name] with chaplain to coordinate notarizing HCPOA/Living Will.   Exam: Alert, oriented although memory poor from day to day. No distress. Breathing regular, unlabored. Abd soft. Bilateral legs wrapped.   Plan: - Full code.  - Advance Directive completed.  - Proceed with dialysis.  - Agrees with rehab placement and recommend palliative to follow outpatient. Ultimate goal is to return to his home.  - Please discuss all main decisions with daughter, [name], as patient has poor recall.   [id number] 60 min  Vinie Sill, NP Palliative Medicine Team Pager [phone] (Please see [email or web address] for schedule) Team Phone [phone]    Greater than 50%  of this time was spent counseling and coordinating care related to the above assessment and plan

## 2020-09-23 NOTE — Progress Notes (Signed)
Renal Navigator met with patient, daughter and Palliative Care/A. Parker at bedside. Navigator explained role. Patient's daughter appreciative and states that her father was driving prior to this hospitalization. We all agree that he should not be driving any time in the near future. Patient's daughter states that they want SNF at discharge and that he had a bed offer from [facility] when he was at [facility] and that this is the only option she will accept. Navigator explained that the unit Education officer, museum will speak with them about SNF offer, as it may not still remain at this time, since time has passed and now that patient has started HD. HD scheduling and transportation can sometimes change SNF bed offers. Patient's daughter stating understanding. Navigator suggests completing application for [facility], which will transport patient to outpatient HD while he is in some SNFs (such as [facility] if he is to get another offer there) and then be available to him at home after discharge from SNF. Navigator to assist with application. Navigator submitted referral to [facility] to request seat for outpatient HD treatment to clinic closest to patient's home with an opening at this time. Navigator will monitor closely.  Alphonzo Cruise, [place] Renal Navigator [phone]

## 2020-09-23 NOTE — Progress Notes (Addendum)
[facility] NEPHROLOGY PROGRESS NOTE  Assessment/ Plan: Pt is a 69 y.o. yo male with history of CHF, DM, A. fib, hypertension found down at home during welfare check currently followed by [facility].  In ED he had A. fib with RVR, diabetic foot infection and acute kidney injury.  #Acute kidney injury on CKD stage IIIb: Baseline creatinine level 1.3-1.9.  AKI multifactorial etiology including cardiorenal syndrome, ATN.  Started dialysis on 11/3 and has been tolerating well.  Tried high-dose IV diuretics with no response.  No sign of renal recovery at the moment.  He had dialysis yesterday around 2 L of UF, tolerated well. Per palliative care team, there is a family meeting today at 10:00 to discuss goals of care.  If decision make to continue long-term dialysis then he will need IR consult to place tunneled catheter and VVS consult for permanent access.  Vein mapping is already ordered. He will also need outpatient HD arranged for AKI.  #CHF with systolic and diastolic dysfunction: Volume overload causing cardiorenal syndrome.  Did not respond with IV diuretics therefore now volume management with dialysis. Discontinue diuretics.  #Hypertension: On metoprolol for A. fib.  Requiring midodrine for intradialytic hypotension.  #Sepsis with osteomyelitis of fifth toe on the right foot: On antibiotics per primary team.  #Anemia due to sepsis, AKI: Hemoglobin at goal.  #Metabolic acidosis: Managed with dialysis now.  #Bleeding from tunnel catheter site: Turning off heparin.  Applying local pressure by the staff and contact IR.  Monitor hemoglobin.  Discussed with the nursing staffs at bedside.  Addendum:  We had a family meeting with palliative care primary team and patient's daughter at the bedside.  After extensive discussion patient and her daughter like to continue dialysis as outpatient including placement of tunneled catheter and permanent access.  I consulted IR to change HD catheter  to tunneled and consulted vascular for the placement of permanent access.  Subjective: Seen and examined at bedside.  Some bleeding from the tunnel catheter site where staff start applying pressure.  He is alert awake and asking questions appropriately.  Denies nausea vomiting chest pain shortness of breath.  Urine output is only recorded 275 cc. Objective Vital signs in last 24 hours: Vitals:   09/22/20 1211 09/22/20 1810 09/23/20 0429 09/23/20 0845  BP: 106/78 100/76 (!) 87/75 115/72  Pulse:  98 (!) 52 (!) 108  Resp: 18 19  18   Temp: 97.8 F (36.6 C) 98.4 F (36.9 C) 97.8 F (36.6 C) 98.5 F (36.9 C)  TempSrc: Oral  Oral   SpO2: 99% 99% 95%   Weight:      Height:       Weight change:   Intake/Output Summary (Last 24 hours) at 09/23/2020 0945 Last data filed at 09/23/2020 0600 Gross per 24 hour  Intake 998.22 ml  Output 2275 ml  Net -1276.78 ml       Labs: Basic Metabolic Panel: Recent Labs  Lab 09/18/20 0202 09/18/20 0202 09/19/20 0407 09/19/20 0407 09/20/20 0503 09/20/20 0503 09/21/20 0321 09/22/20 0301 09/23/20 0340  NA 136   < > 134*   < > 134*   < > 135 137 137  K 3.7   < > 3.2*   < > 3.5   < > 3.1* 3.9 3.6  CL 104   < > 98   < > 99   < > 101 104 102  CO2 18*   < > 23   < > 24   < >  21* 19* 22  GLUCOSE 159*   < > 126*   < > 130*   < > 96 116* 110*  BUN 85*   < > 52*   < > 39*   < > 49* 63* 52*  CREATININE 6.09*   < > 5.03*   < > 4.79*   < > 6.05* 6.93* 6.15*  CALCIUM 8.0*   < > 7.9*   < > 8.3*   < > 8.5* 8.7* 8.3*  PHOS 7.0*  --  6.7*  --  5.7*  --   --   --   --    < > = values in this interval not displayed.   Liver Function Tests: No results for input(s): AST, ALT, ALKPHOS, BILITOT, PROT, ALBUMIN in the last 168 hours. No results for input(s): LIPASE, AMYLASE in the last 168 hours. No results for input(s): AMMONIA in the last 168 hours. CBC: Recent Labs  Lab 09/19/20 0407 09/19/20 0407 09/20/20 0503 09/20/20 0503 09/21/20 0321 09/22/20 0301  09/23/20 0340  WBC 9.8   < > 10.5   < > 8.2 8.1 6.5  NEUTROABS 6.6   < > 7.1   < > 6.2 5.5 3.7  HGB 10.5*   < > 11.3*   < > 10.6* 11.0* 11.0*  HCT 33.2*   < > 36.3*   < > 34.0* 36.4* 36.5*  MCV 86.2  --  86.2  --  85.6 88.1 88.6  PLT 304   < > 348   < > 299 291 252   < > = values in this interval not displayed.   Cardiac Enzymes: No results for input(s): CKTOTAL, CKMB, CKMBINDEX, TROPONINI in the last 168 hours. CBG: Recent Labs  Lab 09/22/20 0655 09/22/20 1208 09/22/20 1638 09/22/20 2128 09/23/20 0650  GLUCAP 92 86 110* 122* 98    Iron Studies: No results for input(s): IRON, TIBC, TRANSFERRIN, FERRITIN in the last 72 hours. Studies/Results: No results found.  Medications: Infusions:   Scheduled Medications: . (feeding supplement) PROSource Plus  30 mL Oral QID  . amoxicillin-clavulanate  1 tablet Oral Daily  . Chlorhexidine Gluconate Cloth  6 each Topical Daily  . doxycycline  100 mg Oral Q12H  . insulin aspart  0-15 Units Subcutaneous TID WC  . insulin aspart  0-5 Units Subcutaneous QHS  . midodrine  10 mg Oral TID WC  . traZODone  25 mg Oral QHS    have reviewed scheduled and prn medications.  Physical Exam: General:NAD, comfortable Heart:RRR, s1s2 nl Lungs: Basal decreased breath sound Abdomen:soft, Non-tender, non-distended Extremities: Edema present and chronic stasis changes. Dialysis Access: Neck temporary HD catheter, pressure applied after bleeding.  Krish Bailly Prasad Lilyahna Sirmon 09/23/2020,9:45 AM  LOS: 15 days  Pager: [phone]

## 2020-09-23 NOTE — Progress Notes (Addendum)
ANTICOAGULATION CONSULT NOTE - Follow Up Consult  Pharmacy Consult for IV Heparin Indication: atrial fibrillation  Allergies  Allergen Reactions   Codeine Swelling    Water retention   Sulfur Other (See Comments)    Not sure of reaction a young child    Patient Measurements: Height: 5\' 9"  (175.3 cm) Weight: 126.9 kg (279 lb 12.2 oz) IBW/kg (Calculated) : 70.7 Heparin Dosing Weight: 93 kg  Vital Signs: Temp: 97.8 F (36.6 C) (11/09 0429) Temp Source: Oral (11/09 0429) BP: 87/75 (11/09 0429) Pulse Rate: 52 (11/09 0429)  Labs: Recent Labs    09/21/20 0321 09/21/20 0321 09/22/20 0301 09/23/20 0340  HGB 10.6*   < > 11.0* 11.0*  HCT 34.0*  --  36.4* 36.5*  PLT 299  --  291 252  HEPARINUNFRC 0.32  --  0.59 0.74*  CREATININE 6.05*  --  6.93* 6.15*   < > = values in this interval not displayed.    Estimated Creatinine Clearance: 14.5 mL/min (A) (by C-G formula based on SCr of 6.15 mg/dL (H)).  Assessment: 69yoM admitted after a fall and was found to have osteomyelitis of his feet. PMH s/f atrial fibrillation on apixaban. Patient was transitioned from apixaban to heparin for possible surgery. He declined surgery and was restarted on apixaban 09/10/20.  Pharmacy was consulted for transition back to IV heparin on 11/1 for AKI and HD catheter replacement. Last dose of apixaban given 11/1, s/p HD cath placement on 11/2. AKI, s/p HD and UF, currently on diuretics.  Heparin level this morning is supratherapeutic at 0.74 (new goal 0.3-0.5).   The patient has had some UOP in response to diuretics - ~300cc/24h - which was charted as bloody/dark red. Also of note patient has had bleeding around central line on 11/8 which has worsened today 11/9.   Previous plan was to reduce heparin rate to 1,500units/hr for supratherapeutic level but due to worsening bleeding, heparin drip has now been stopped. MD is aware.    CBC was stable with AM labs, Hgb 11.  Goal of Therapy:  Heparin  level 0.3-0.5 units/ml Monitor platelets by anticoagulation protocol: Yes   Plan:  - Hold heparin drip for bleeding and follow-up with stat CBC  - Will continue to monitor daily CBC and bleeding (MD aware) - Will follow-up on plans for anticoagulation  Thank you for allowing pharmacy to be a part of this patients care.  Mercy Riding, PharmD PGY1 Acute Care Pharmacy Resident Please refer to [facility] for unit-specific pharmacist

## 2020-09-23 NOTE — Progress Notes (Signed)
Patient with history of DM, CHF, PAF, HTN. Admitted after being found to be down in his bathroom following a welfare check. IR placed a temporary dialysis catheter on 11.2.21   Interventional Radiology requested to see patient at bedside for bleeding around the exit site of the temporary dialysis catheter. During evaluation no active bleeding noted. The Heparin gtt has been stopped. As not to disrupt any clot the dressing was left in place. Should patient re bleed recommend patient be placed in high fowlers position with direct pressure applied to area for approximately 10 minutes. Should this not cease the bleeding please contact IR.

## 2020-09-23 NOTE — Progress Notes (Signed)
PROGRESS NOTE    [place].  SPQ:[id number] DOB: 1951-04-11 DOA: 09/08/2020 PCP: Aura Dials, MD   Chief Complaint  Patient presents with  . Wound Infection    Brief Narrative:  69 year old male with J3HL, chronic systolic and diastolic CHF, PAF supposed to be on Eliquis, HTN, morbid obesity brought to ED 09/08/2020 after he was found down in his house.  Reportedly patient was followed.  Protective services, has very unsanitary living condition at home and was found down in the bathroom during welfare check.  Per report: Patient reports falling while standing up from bathroom without LOC, resulting in fall onto R chest and resultant R lower chest pain. Patient was found down and brought into ED.  Of note, patient reports stopping all medications several months ago with exception of torsemide because "I didn't think I needed them anymore." Pt has only been taking torsemide PRN swelling.  He was found to have necrotic foot infections and underwent xray of right foot, which revealed subcortical lucency involving 5th digit concerning for OM.  He was started on vancomycin and cefepime and orthopedic surgery was consulted. ABIs were also ordered. Test was inconclusive due to severe lower extremity edema.   Initially patient was also against surgical treatment options despite severity of his wounds, their obvious chronicity, and inability to explain consequences for avoiding surgery. We requested psychiatry to evaluate patient for decision making ability in this setting. After repeat explanation of his diagnosis and possible treatment options he was able to voice adequate understanding to psychiatry and was considered to have decision making capacity regarding his current infection.   He will need to continue with serial compression wrappings at discharge in efforts to promote better chance of wound healing and proper assessment of ABIs to help decide best treatment course.    Given his significant deconditioning and after discussion with his daughter, there is concern for his ability to safely return home due to poor living situation; Physical therapy was consulted. After PT evaluation he was recommended for SNF at discharge. He was accepting of this and placement was pursued.  Overall, he had good clinical response to IV antibiotics. He was deescalated to Augmentin and doxycycline to finish out a course. He will continue with serial compression wrappings at discharge to rehab as well as following up with Ortho for further management.  During hospitalization he was started on IV Lasix for diuresis given his severe lower extremity edema. His creatinine continued to slowly uptrend. Urine output measuring was inaccurate and difficult to gauge response. Nephrology was consulted on 09/14/2020 given his uptrending creatinine in setting of multiple antibiotics initially and some transient hypotension.  Renal ultrasound showed no hydronephrosis.  He underwent temporary HD catheter placement by IR on 09/16/2020 and nephrology recommended transfer to [facility] for possible short-term hemodialysis. Noncompliant in general and very self-neglected with denial component. Admitted with horrendous feet/legs. Has osteo in R foot 5th digit. Very well possibly in left foot too chronically.  Too edematous for good ABI's per ortho so getting serial compression wrapping with plans for outpt follow up with ortho (on augmentin/doxy now for OM tx).  Patient was transferred to [facility] 11/3 and dialysis was started. Patient underwent dialysis multiple sessions, no renal recovery.  Some urine output and hematuria. Seen by palliative care and ongoing discussion involving family members.  Patient wanted to proceed with dialysis on outpatient basis.  Subjective: Seen and examined this morning had episodes of bleeding from the dialysis  access site  Otherwise patient has no new  complaints resting comfortably.    Assessment & Plan:  AKI on CKD stage III with baseline creatinine 1.2-1.3. seen by Nephrology has undergone extensive work-up . AKI is multifactorial with cardiorenal syndrome, ATN contributing 2/2 sepsis.s/p  temp HD cath by IR 12/2 transferred to [facility] and started on HD:11/3, 11/4,11/5, ultrafiltration limited by hypotension.  Placed on midodrine.  Continue dialysis per nephrology, looking into outpatient dialysis, skilled nursing facility placement, had family meeting completed this morning. Recent Labs  Lab 09/19/20 0407 09/20/20 0503 09/21/20 0321 09/22/20 0301 09/23/20 0340  BUN 52* 39* 49* 63* 52*  CREATININE 5.03* 4.79* 6.05* 6.93* 6.15*   Acute on chronic combined systolic and diastolic CHF: LVEF 59-93% in LVEF, previously back in March EF was 40-45%: TTE shows lvef 30-35%, severe concentric LVH, global hypokinesia.  No significant urine output despite being on high-dose Lasix.  Now on dialysis dependent.  Hypotension: Blood pressure remains soft intermittently.  Continue midodrine.  Will discontinue metoprolol.    Chronic lymphedema followed by wound care.  Continue with dressing being changed on Wednesday, wound care has seen the patient multiple times.  Continue extensive outpatient wound care follow-up  Osteomyelitis of fifth toe of right foot, osteomyelitis per x-ray, ABI indeterminate due to lower extremity edema.  Serial compression to aid with improving edema, orthopedics had seen the patient and patient refused amputation.  Discussed today he reports he is not going to have amputation. Outpatient follow-up.  Initially on IV antibiotics which have been switched to oral Augmentin and doxycycline.  Discussed with Dr. Sharol Given keep on current antibiotics on discharge will continue doxycycline as per Dr. Jess Barters recommendation for long-term and he will follow-up outpatient.  Falls at home/neuropathy: Continue PT OT, will need skilled nursing  facility placement.  Hypokalemia resolved  Severe sepsis TTS:VXBLTJ is resolved.  Acute metabolic encephalopathy, uremic/septic encephalopathy.  Overall mental status is improved alert awake at times forgetful and does not remember some conversation from previous day.   PAF with QZE:[id number] with meds.metoprolol held due to hypotension, on heparin but held due to bleeding from the HD access site.  Resume once okay with IR.  Hopefully we can switch to oral Eliquis will discuss with nephrology  Hematuria after placing foley catheter suspect catheter related trauma: Not much of urine output would DC Foley catheter and monitor bladder scan to make sure there is no retention.   Recent Labs  Lab 09/20/20 0503 09/21/20 0321 09/22/20 0301 09/23/20 0340 09/23/20 1127  HGB 11.3* 10.6* 11.0* 11.0* 10.5*  HCT 36.3* 34.0* 36.4* 36.5* 35.0*   Type 2 diabetes mellitus: A1c 9.6 10/25.blood sugar is controlled keep on sliding.  Recent Labs  Lab 09/22/20 1208 09/22/20 1638 09/22/20 2128 09/23/20 0650 09/23/20 1156  GLUCAP 86 110* 122* 98 104*   Morbid obesity with BMI 43>41, will benefit with weight loss/healthy lifestyle and outpatient sleep apnea evaluation.    GOC: Family meeting completed this morning plan is to proceed with dialysis outpatient skilled nursing facility rehab.  Patient is at risk of decompensation will need close follow-up monitoring continue dialysis as per nephrology.  He has refused amputation and will need to follow-up with Dr. Sharol Given and continue on long-term antibiotics.    Nutrition: Diet Order            Diet Carb Modified Fluid consistency: Thin; Room service appropriate? Yes  Diet effective now  Nutrition Problem: Increased nutrient needs Etiology: wound healing, acute illness Signs/Symptoms: estimated needs Interventions: MVI, Other (Comment) (Prosource Plus)  Body mass index is 41.31 kg/m.  Pressure Ulcer: Pressure Injury 09/08/20  Heel Right Stage 3 -  Full thickness tissue loss. Subcutaneous fat may be visible but bone, tendon or muscle are NOT exposed. 25% red, 75% slouogh, strong odor, mod brown (Active)  09/08/20   Location: Heel  Location Orientation: Right  Staging: Stage 3 -  Full thickness tissue loss. Subcutaneous fat may be visible but bone, tendon or muscle are NOT exposed.  Wound Description (Comments): 25% red, 75% slouogh, strong odor, mod brown  Present on Admission: Yes     Pressure Injury 09/17/20 Buttocks Right;Left Deep Tissue Pressure Injury - Purple or maroon localized area of discolored intact skin or blood-filled blister due to damage of underlying soft tissue from pressure and/or shear. (Active)  09/17/20 1805  Location: Buttocks  Location Orientation: Right;Left  Staging: Deep Tissue Pressure Injury - Purple or maroon localized area of discolored intact skin or blood-filled blister due to damage of underlying soft tissue from pressure and/or shear.  Wound Description (Comments):   Present on Admission:    DVT prophylaxis: heparin gtt Code Status:  Code Status: Full Code  Family Communication: plan of care discussed with patient along with his daughter at the bedside and I had also spoken with another  Daughter [name] who happens to be Therapist, sports.  Status OY:DXAJOINOM Remains inpatient appropriate because:IV treatments appropriate due to intensity of illness or inability to take PO and Inpatient level of care appropriate due to severity of illness  Dispo:The patient is from: Home            Anticipated d/c is to: TBD            Anticipated d/c date is: 2 days            Patient currently is not medically stable to d/c. Consultants:see note  Procedures:see note  Culture/Microbiology    Component Value Date/Time   SDES  09/08/2020 2103    BLOOD LEFT HAND Performed at [facility], [place] [address] [place], [place] [address]    SPECREQUEST  09/08/2020 2103    BOTTLES DRAWN  AEROBIC AND ANAEROBIC Blood Culture adequate volume Performed at [facility] [address], [place], [place] [address]    CULT  09/08/2020 2103    NO GROWTH 5 DAYS Performed at [facility] [address] [place],  [address]    REPTSTATUS 09/13/2020 FINAL 09/08/2020 2103    Other culture-see note  Medications: Scheduled Meds: . (feeding supplement) PROSource Plus  30 mL Oral QID  . amoxicillin-clavulanate  1 tablet Oral Daily  . Chlorhexidine Gluconate Cloth  6 each Topical Daily  . doxycycline  100 mg Oral Q12H  . insulin aspart  0-15 Units Subcutaneous TID WC  . insulin aspart  0-5 Units Subcutaneous QHS  . midodrine  10 mg Oral TID WC  . traZODone  25 mg Oral QHS   Continuous Infusions:   Antimicrobials: Anti-infectives (From admission, onward)   Start     Dose/Rate Route Frequency Ordered Stop   09/17/20 1000  amoxicillin-clavulanate (AUGMENTIN) 500-125 MG per tablet 500 mg        1 tablet Oral Daily 09/17/20 0839     09/14/20 2200  amoxicillin-clavulanate (AUGMENTIN) 500-125 MG per tablet 500 mg  Status:  Discontinued        1 tablet  Oral 2 times daily 09/14/20 1524 09/17/20 0839   09/12/20 1000  amoxicillin-clavulanate (AUGMENTIN) 875-125 MG per tablet 1 tablet  Status:  Discontinued        1 tablet Oral Every 12 hours 09/12/20 0823 09/14/20 1524   09/12/20 1000  doxycycline (VIBRA-TABS) tablet 100 mg        100 mg Oral Every 12 hours 09/12/20 0823     09/10/20 2000  DAPTOmycin (CUBICIN) 740 mg in sodium chloride 0.9 % IVPB  Status:  Discontinued        740 mg 229.6 mL/hr over 30 Minutes Intravenous Daily 09/10/20 1230 09/12/20 0822   09/10/20 1200  metroNIDAZOLE (FLAGYL) IVPB 500 mg  Status:  Discontinued        500 mg 100 mL/hr over 60 Minutes Intravenous Every 8 hours 09/10/20 1050 09/12/20 0822   09/10/20 1132  vancomycin variable dose per unstable renal function (pharmacist dosing)  Status:  Discontinued         Does not apply  See admin instructions 09/10/20 1132 09/10/20 1230   09/09/20 1700  vancomycin (VANCOREADY) IVPB 1500 mg/300 mL  Status:  Discontinued        1,500 mg 150 mL/hr over 120 Minutes Intravenous Every 24 hours 09/08/20 1956 09/10/20 1132   09/09/20 1000  ceFEPIme (MAXIPIME) 2 g in sodium chloride 0.9 % 100 mL IVPB  Status:  Discontinued        2 g 200 mL/hr over 30 Minutes Intravenous Every 12 hours 09/08/20 1947 09/12/20 0822   09/08/20 2200  ceFEPIme (MAXIPIME) 2 g in sodium chloride 0.9 % 100 mL IVPB        2 g 200 mL/hr over 30 Minutes Intravenous  Once 09/08/20 1754 09/08/20 2206   09/08/20 1800  vancomycin (VANCOCIN) IVPB 1000 mg/200 mL premix  Status:  Discontinued        1,000 mg 200 mL/hr over 60 Minutes Intravenous  Once 09/08/20 1754 09/08/20 1756   09/08/20 1630  vancomycin (VANCOREADY) IVPB 2000 mg/400 mL        2,000 mg 200 mL/hr over 120 Minutes Intravenous  Once 09/08/20 1551 09/08/20 1842   09/08/20 1600  piperacillin-tazobactam (ZOSYN) IVPB 3.375 g        3.375 g 100 mL/hr over 30 Minutes Intravenous  Once 09/08/20 1549 09/08/20 1645     Objective: Vitals: Today's Vitals   09/23/20 0137 09/23/20 0429 09/23/20 0845 09/23/20 1200  BP:  (!) 87/75 115/72   Pulse:  (!) 52 (!) 108   Resp:   18   Temp:  97.8 F (36.6 C) 98.5 F (36.9 C)   TempSrc:  Oral    SpO2:  95%    Weight:      Height:      PainSc: Asleep   0-No pain    Intake/Output Summary (Last 24 hours) at 09/23/2020 1351 Last data filed at 09/23/2020 1155 Gross per 24 hour  Intake 931.97 ml  Output 276 ml  Net 655.97 ml   Filed Weights   09/20/20 2048 09/21/20 0500 09/22/20 0750  Weight: 128.5 kg 128.5 kg 126.9 kg   Weight change:   Intake/Output from previous day: 11/08 0701 - 11/09 0700 In: 998.2 [P.O.:600; I.V.:398.2] Out: 2275 [Urine:275] Intake/Output this shift: Total I/O In: -  Out: 1 [Urine:1]  Examination: General exam: AAAO, Obese, on RA , NAD, weak appearing. HEENT:Oral mucosa  moist, Ear/Nose WNL grossly, dentition normal. Respiratory system: bilaterally clear,no wheezing or crackles,no use of accessory muscle Cardiovascular  system: S1 & S2 +, No JVD,. Gastrointestinal system: Abdomen soft, NT,ND, BS+ Nervous System:Alert, awake, moving extremities and grossly nonfocal Extremities: Extensive Lymphedema, BL lein una boot, Skin: No rashes,no icterus. MSK: Normal muscle bulk,tone, power Rt IJ HD cath-dressing intact no more bleeding  Data Reviewed: I have personally reviewed following labs and imaging studies CBC: Recent Labs  Lab 09/19/20 0407 09/19/20 0407 09/20/20 0503 09/21/20 0321 09/22/20 0301 09/23/20 0340 09/23/20 1127  WBC 9.8  --  10.5 8.2 8.1 6.5  --   NEUTROABS 6.6  --  7.1 6.2 5.5 3.7  --   HGB 10.5*   < > 11.3* 10.6* 11.0* 11.0* 10.5*  HCT 33.2*   < > 36.3* 34.0* 36.4* 36.5* 35.0*  MCV 86.2  --  86.2 85.6 88.1 88.6  --   PLT 304  --  348 299 291 252  --    < > = values in this interval not displayed.   Basic Metabolic Panel: Recent Labs  Lab 09/17/20 0600 09/17/20 0600 09/18/20 0202 09/18/20 0202 09/19/20 0407 09/20/20 0503 09/21/20 0321 09/22/20 0301 09/23/20 0340  NA 136   < > 136   < > 134* 134* 135 137 137  K 4.3   < > 3.7   < > 3.2* 3.5 3.1* 3.9 3.6  CL 105   < > 104   < > 98 99 101 104 102  CO2 16*   < > 18*   < > 23 24 21* 19* 22  GLUCOSE 169*   < > 159*   < > 126* 130* 96 116* 110*  BUN 118*   < > 85*   < > 52* 39* 49* 63* 52*  CREATININE 6.79*   < > 6.09*   < > 5.03* 4.79* 6.05* 6.93* 6.15*  CALCIUM 8.1*   < > 8.0*   < > 7.9* 8.3* 8.5* 8.7* 8.3*  MG 2.2  --  1.7  --  1.8 1.8  --   --   --   PHOS 9.0*  --  7.0*  --  6.7* 5.7*  --   --   --    < > = values in this interval not displayed.   GFR: Estimated Creatinine Clearance: 14.5 mL/min (A) (by C-G formula based on SCr of 6.15 mg/dL (H)). Liver Function Tests: No results for input(s): AST, ALT, ALKPHOS, BILITOT, PROT, ALBUMIN in the last 168 hours. No results for  input(s): LIPASE, AMYLASE in the last 168 hours. No results for input(s): AMMONIA in the last 168 hours. Coagulation Profile: No results for input(s): INR, PROTIME in the last 168 hours. Cardiac Enzymes: No results for input(s): CKTOTAL, CKMB, CKMBINDEX, TROPONINI in the last 168 hours. BNP (last 3 results) No results for input(s): PROBNP in the last 8760 hours. HbA1C: No results for input(s): HGBA1C in the last 72 hours. CBG: Recent Labs  Lab 09/22/20 1208 09/22/20 1638 09/22/20 2128 09/23/20 0650 09/23/20 1156  GLUCAP 86 110* 122* 98 104*   Lipid Profile: No results for input(s): CHOL, HDL, LDLCALC, TRIG, CHOLHDL, LDLDIRECT in the last 72 hours. Thyroid Function Tests: No results for input(s): TSH, T4TOTAL, FREET4, T3FREE, THYROIDAB in the last 72 hours. Anemia Panel: No results for input(s): VITAMINB12, FOLATE, FERRITIN, TIBC, IRON, RETICCTPCT in the last 72 hours. Sepsis Labs: No results for input(s): PROCALCITON, LATICACIDVEN in the last 168 hours.  No results found for this or any previous visit (from the past 240 hour(s)).   Radiology Studies: No results  found.   LOS: 15 days   Antonieta Pert, MD [facility]  09/23/2020, 1:51 PM

## 2020-09-23 NOTE — Progress Notes (Signed)
Removed foley as per ordered.

## 2020-09-23 NOTE — Care Management Important Message (Signed)
Important Message  Patient Details  Name: [name]. MRN: [id number] Date of Birth: [birth year]   Medicare Important Message Given:  Yes     Ryelle Ruvalcaba P Jeriel Vivanco 09/23/2020, 1:04 PM

## 2020-09-23 NOTE — Progress Notes (Signed)
Rounded on patient today in correlation to transition to HD. Patient is pleasant and OOB to chair, granted permission for [place] to discuss hemodialysis. Ordered consult to dietician and Kidney Failure book. Patient educated at the bedside regarding care of dialysis catheter, AV fistula/graft placement and  site care, assessment of thrill daily and proper medication administration on HD days.  Patient also educated on the importance of adhering to scheduled dialysis treatments, the effects of fluid overload, hyperkalemia and hyperphosphatemia. Patient capable of re-verbalizing via teach back method. Also educated patient on services available through the interdisciplinary team in the clinic setting and the differences he will note when transitioning to the outpatient setting from the hospital. Patient with no further questions at this time. Handouts and contact information provided to patient for any further assistance. Will continue to follow through admission.   Dorthey Sawyer, RN  Dialysis Nurse Coordinator Phone: [phone]

## 2020-09-23 NOTE — Progress Notes (Addendum)
ANTICOAGULATION CONSULT NOTE - Follow Up Consult  Pharmacy Consult for IV Heparin Indication: atrial fibrillation  A/P: Heparin drip stopped earlier today for bleeding around [facility] site. IR evaluated and left recommendations. Spoke with Anderson Malta from [employer] and ok to resume heparin drip at lower rate. Will attempt to keep heparin level 0.3-0.5 units/ml.  Resume heparin drip at 1500 units/hr with no bolus Heparin level with am labs Daily heparin level and CBC Monitor for s/sx of bleeding  Thank you for involving pharmacy in this patient's care.  Renold Genta, PharmD, BCPS Clinical Pharmacist Clinical phone for 09/23/2020 until 3p is [phone] 09/23/2020 2:51 PM  **Pharmacist phone directory can be found on [email or web address] listed under [place]**

## 2020-09-24 ENCOUNTER — Inpatient Hospital Stay (HOSPITAL_COMMUNITY): Payer: Medicare Other

## 2020-09-24 ENCOUNTER — Encounter (HOSPITAL_COMMUNITY): Payer: Self-pay | Admitting: Internal Medicine

## 2020-09-24 ENCOUNTER — Inpatient Hospital Stay (HOSPITAL_COMMUNITY): Payer: Medicare Other | Admitting: Anesthesiology

## 2020-09-24 ENCOUNTER — Encounter (HOSPITAL_COMMUNITY)
Admission: EM | Discharge status: Against Medical Advice | Payer: Self-pay | Source: Home / Self Care | Attending: Internal Medicine

## 2020-09-24 DIAGNOSIS — M869 Osteomyelitis, unspecified: Secondary | ICD-10-CM | POA: Diagnosis not present

## 2020-09-24 HISTORY — PX: AV FISTULA PLACEMENT: SHX1204

## 2020-09-24 HISTORY — PX: INSERTION OF DIALYSIS CATHETER: SHX1324

## 2020-09-24 LAB — GLUCOSE, CAPILLARY
Glucose-Capillary: 90 mg/dL (ref 70–99)
Glucose-Capillary: 103 mg/dL — ABNORMAL HIGH (ref 70–99)
Glucose-Capillary: 105 mg/dL — ABNORMAL HIGH (ref 70–99)
Glucose-Capillary: 117 mg/dL — ABNORMAL HIGH (ref 70–99)
Glucose-Capillary: 120 mg/dL — ABNORMAL HIGH (ref 70–99)

## 2020-09-24 LAB — CBC
HCT: 33.9 % — ABNORMAL LOW (ref 39.0–52.0)
Hemoglobin: 10.5 g/dL — ABNORMAL LOW (ref 13.0–17.0)
MCH: 27.6 pg (ref 26.0–34.0)
MCHC: 31 g/dL (ref 30.0–36.0)
MCV: 89 fL (ref 80.0–100.0)
Platelets: 243 10*3/uL (ref 150–400)
RBC: 3.81 MIL/uL — ABNORMAL LOW (ref 4.22–5.81)
RDW: 15.7 % — ABNORMAL HIGH (ref 11.5–15.5)
WBC: 6.1 10*3/uL (ref 4.0–10.5)
nRBC: 0 % (ref 0.0–0.2)

## 2020-09-24 LAB — HEPARIN LEVEL (UNFRACTIONATED)
Heparin Unfractionated: 0.46 IU/mL (ref 0.30–0.70)
Heparin Unfractionated: 0.35 IU/mL (ref 0.30–0.70)

## 2020-09-24 SURGERY — ARTERIOVENOUS (AV) FISTULA CREATION
Anesthesia: General | Site: Neck | Laterality: Right

## 2020-09-24 MED ORDER — DEXAMETHASONE SODIUM PHOSPHATE 10 MG/ML IJ SOLN
INTRAMUSCULAR | Status: AC
  Filled 2020-09-24: qty 1

## 2020-09-24 MED ORDER — ONDANSETRON HCL 4 MG/2ML IJ SOLN: 4.0000 mg | Freq: Once | INTRAMUSCULAR | Status: DC | PRN

## 2020-09-24 MED ORDER — HEPARIN (PORCINE) 25000 UT/250ML-% IV SOLN
1500.0000 [IU]/h | INTRAVENOUS | Status: DC
  Administered 2020-09-25: 1500 [IU]/h via INTRAVENOUS
  Filled 2020-09-24: qty 250

## 2020-09-24 MED ORDER — SODIUM CHLORIDE 0.9 % IV SOLN: INTRAVENOUS | Status: DC

## 2020-09-24 MED ORDER — METOPROLOL TARTRATE 12.5 MG HALF TABLET
12.5000 mg | ORAL_TABLET | Freq: Two times a day (BID) | ORAL | Status: DC
  Administered 2020-09-25 (×2): 12.5 mg via ORAL
  Filled 2020-09-24 (×4): qty 1

## 2020-09-24 MED ORDER — HEPARIN SODIUM (PORCINE) 1000 UNIT/ML IJ SOLN
INTRAMUSCULAR | Status: AC
  Filled 2020-09-24: qty 1

## 2020-09-24 MED ORDER — 0.9 % SODIUM CHLORIDE (POUR BTL) OPTIME
TOPICAL | Status: DC | PRN
  Administered 2020-09-24: 1000 mL

## 2020-09-24 MED ORDER — SUGAMMADEX SODIUM 200 MG/2ML IV SOLN
INTRAVENOUS | Status: DC | PRN
  Administered 2020-09-24: 200 mg via INTRAVENOUS

## 2020-09-24 MED ORDER — FENTANYL CITRATE (PF) 100 MCG/2ML IJ SOLN: 25.0000 ug | INTRAMUSCULAR | Status: DC | PRN

## 2020-09-24 MED ORDER — HYDROCODONE-ACETAMINOPHEN 10-325 MG PO TABS
1.0000 | ORAL_TABLET | ORAL | Status: DC | PRN
  Administered 2020-09-24 – 2020-09-29 (×4): 1 via ORAL
  Filled 2020-09-24 (×4): qty 1

## 2020-09-24 MED ORDER — HEPARIN SODIUM (PORCINE) 1000 UNIT/ML IJ SOLN
INTRAMUSCULAR | Status: AC
  Administered 2020-09-24: 3800 [IU]
  Filled 2020-09-24: qty 4

## 2020-09-24 MED ORDER — EPHEDRINE 5 MG/ML INJ
INTRAVENOUS | Status: AC
  Filled 2020-09-24: qty 20

## 2020-09-24 MED ORDER — LABETALOL HCL 5 MG/ML IV SOLN
INTRAVENOUS | Status: AC
  Filled 2020-09-24: qty 4

## 2020-09-24 MED ORDER — PHENYLEPHRINE HCL-NACL 20-0.9 MG/250ML-% IV SOLN
INTRAVENOUS | Status: DC | PRN
  Administered 2020-09-24: 25 ug/min via INTRAVENOUS

## 2020-09-24 MED ORDER — HYDROMORPHONE HCL 1 MG/ML IJ SOLN
INTRAMUSCULAR | Status: AC
  Filled 2020-09-24: qty 1

## 2020-09-24 MED ORDER — CHLORHEXIDINE GLUCONATE CLOTH 2 % EX PADS
6.0000 | MEDICATED_PAD | Freq: Every day | CUTANEOUS | Status: DC
  Administered 2020-09-24 – 2020-09-25 (×2): 6 via TOPICAL

## 2020-09-24 MED ORDER — ONDANSETRON HCL 4 MG/2ML IJ SOLN
INTRAMUSCULAR | Status: DC | PRN
  Administered 2020-09-24: 4 mg via INTRAVENOUS

## 2020-09-24 MED ORDER — LIDOCAINE 2% (20 MG/ML) 5 ML SYRINGE
INTRAMUSCULAR | Status: AC
  Filled 2020-09-24: qty 20

## 2020-09-24 MED ORDER — ORAL CARE MOUTH RINSE: 15.0000 mL | Freq: Once | OROMUCOSAL | Status: AC

## 2020-09-24 MED ORDER — SUCCINYLCHOLINE CHLORIDE 200 MG/10ML IV SOSY
PREFILLED_SYRINGE | INTRAVENOUS | Status: AC
  Filled 2020-09-24: qty 20

## 2020-09-24 MED ORDER — ROCURONIUM BROMIDE 10 MG/ML (PF) SYRINGE
PREFILLED_SYRINGE | INTRAVENOUS | Status: DC | PRN
  Administered 2020-09-24: 40 mg via INTRAVENOUS

## 2020-09-24 MED ORDER — GLYCOPYRROLATE PF 0.2 MG/ML IJ SOSY
PREFILLED_SYRINGE | INTRAMUSCULAR | Status: AC
  Filled 2020-09-24: qty 1

## 2020-09-24 MED ORDER — PHENYLEPHRINE HCL (PRESSORS) 10 MG/ML IV SOLN
INTRAVENOUS | Status: DC | PRN
  Administered 2020-09-24: 120 ug via INTRAVENOUS

## 2020-09-24 MED ORDER — FENTANYL CITRATE (PF) 250 MCG/5ML IJ SOLN
INTRAMUSCULAR | Status: AC
  Filled 2020-09-24: qty 5

## 2020-09-24 MED ORDER — PROPOFOL 10 MG/ML IV BOLUS
INTRAVENOUS | Status: DC | PRN
  Administered 2020-09-24: 120 mg via INTRAVENOUS

## 2020-09-24 MED ORDER — PHENYLEPHRINE 40 MCG/ML (10ML) SYRINGE FOR IV PUSH (FOR BLOOD PRESSURE SUPPORT)
PREFILLED_SYRINGE | INTRAVENOUS | Status: AC
  Filled 2020-09-24: qty 30

## 2020-09-24 MED ORDER — CHLORHEXIDINE GLUCONATE 0.12 % MT SOLN: 15.0000 mL | Freq: Once | OROMUCOSAL | Status: AC

## 2020-09-24 MED ORDER — CEFAZOLIN SODIUM-DEXTROSE 2-3 GM-%(50ML) IV SOLR
INTRAVENOUS | Status: DC | PRN
  Administered 2020-09-24: 2 g via INTRAVENOUS

## 2020-09-24 MED ORDER — FENTANYL CITRATE (PF) 100 MCG/2ML IJ SOLN
INTRAMUSCULAR | Status: DC | PRN
  Administered 2020-09-24: 25 ug via INTRAVENOUS
  Administered 2020-09-24: 75 ug via INTRAVENOUS

## 2020-09-24 MED ORDER — ONDANSETRON HCL 4 MG/2ML IJ SOLN
INTRAMUSCULAR | Status: AC
  Filled 2020-09-24: qty 8

## 2020-09-24 MED ORDER — CHLORHEXIDINE GLUCONATE 0.12 % MT SOLN
OROMUCOSAL | Status: AC
  Administered 2020-09-24: 15 mL via OROMUCOSAL
  Filled 2020-09-24: qty 15

## 2020-09-24 MED ORDER — SUCCINYLCHOLINE CHLORIDE 20 MG/ML IJ SOLN
INTRAMUSCULAR | Status: DC | PRN
  Administered 2020-09-24: 120 mg via INTRAVENOUS

## 2020-09-24 MED ORDER — SODIUM CHLORIDE 0.9 % IV SOLN: INTRAVENOUS | Status: DC | PRN

## 2020-09-24 MED ORDER — ALBUMIN HUMAN 5 % IV SOLN: INTRAVENOUS | Status: DC | PRN

## 2020-09-24 MED ORDER — SODIUM CHLORIDE 0.9 % IV SOLN
INTRAVENOUS | Status: AC
  Filled 2020-09-24: qty 1.2

## 2020-09-24 MED ORDER — HEPARIN SODIUM (PORCINE) 1000 UNIT/ML IJ SOLN
INTRAMUSCULAR | Status: DC | PRN
  Administered 2020-09-24: 3800 [IU]

## 2020-09-24 MED ORDER — SODIUM CHLORIDE 0.9 % IV SOLN
INTRAVENOUS | Status: DC | PRN
  Administered 2020-09-24: 15:00:00 500 mL

## 2020-09-24 MED ORDER — LIDOCAINE HCL (CARDIAC) PF 100 MG/5ML IV SOSY
PREFILLED_SYRINGE | INTRAVENOUS | Status: DC | PRN
  Administered 2020-09-24: 80 mg via INTRAVENOUS

## 2020-09-24 MED ORDER — LIDOCAINE-EPINEPHRINE 0.5 %-1:200000 IJ SOLN
INTRAMUSCULAR | Status: AC
  Filled 2020-09-24: qty 1

## 2020-09-24 MED ORDER — ROCURONIUM BROMIDE 10 MG/ML (PF) SYRINGE
PREFILLED_SYRINGE | INTRAVENOUS | Status: AC
  Filled 2020-09-24: qty 20

## 2020-09-24 SURGICAL SUPPLY — 53 items
ARMBAND PINK RESTRICT EXTREMIT (MISCELLANEOUS) ×8 IMPLANT
BAG DECANTER FOR FLEXI CONT (MISCELLANEOUS) ×4 IMPLANT
BIOPATCH RED 1 DISK 7.0 (GAUZE/BANDAGES/DRESSINGS) ×3 IMPLANT
BIOPATCH RED 1IN DISK 7.0MM (GAUZE/BANDAGES/DRESSINGS) ×1
CANISTER SUCT 3000ML PPV (MISCELLANEOUS) ×4 IMPLANT
CANNULA VESSEL 3MM 2 BLNT TIP (CANNULA) ×4 IMPLANT
CATH PALINDROME-P 19CM W/VT (CATHETERS) IMPLANT
CATH PALINDROME-P 23CM W/VT (CATHETERS) ×2 IMPLANT
CATH PALINDROME-P 28CM W/VT (CATHETERS) IMPLANT
CLIP LIGATING EXTRA MED SLVR (CLIP) ×4 IMPLANT
CLIP LIGATING EXTRA SM BLUE (MISCELLANEOUS) ×4 IMPLANT
COVER PROBE W GEL 5X96 (DRAPES) ×4 IMPLANT
COVER SURGICAL LIGHT HANDLE (MISCELLANEOUS) ×4 IMPLANT
COVER WAND RF STERILE (DRAPES) ×2 IMPLANT
DECANTER SPIKE VIAL GLASS SM (MISCELLANEOUS) ×4 IMPLANT
DERMABOND ADVANCED (GAUZE/BANDAGES/DRESSINGS) ×2
DERMABOND ADVANCED .7 DNX12 (GAUZE/BANDAGES/DRESSINGS) ×2 IMPLANT
DRAPE C-ARM 42X72 X-RAY (DRAPES) ×4 IMPLANT
DRAPE CHEST BREAST 15X10 FENES (DRAPES) ×4 IMPLANT
DRSG COVADERM 4X6 (GAUZE/BANDAGES/DRESSINGS) ×2 IMPLANT
ELECT REM PT RETURN 9FT ADLT (ELECTROSURGICAL) ×4
ELECTRODE REM PT RTRN 9FT ADLT (ELECTROSURGICAL) ×2 IMPLANT
GAUZE 4X4 16PLY RFD (DISPOSABLE) ×4 IMPLANT
GLOVE SS BIOGEL STRL SZ 7.5 (GLOVE) ×2 IMPLANT
GLOVE SUPERSENSE BIOGEL SZ 7.5 (GLOVE) ×4
GOWN STRL REUS W/ TWL LRG LVL3 (GOWN DISPOSABLE) ×6 IMPLANT
GOWN STRL REUS W/TWL LRG LVL3 (GOWN DISPOSABLE) ×16
KIT BASIN OR (CUSTOM PROCEDURE TRAY) ×4 IMPLANT
KIT PALINDROME-P 55CM (CATHETERS) IMPLANT
KIT TURNOVER KIT B (KITS) ×4 IMPLANT
NDL 18GX1X1/2 (RX/OR ONLY) (NEEDLE) ×2 IMPLANT
NDL HYPO 25GX1X1/2 BEV (NEEDLE) ×2 IMPLANT
NEEDLE 18GX1X1/2 (RX/OR ONLY) (NEEDLE) ×4 IMPLANT
NEEDLE 22X1 1/2 (OR ONLY) (NEEDLE) IMPLANT
NEEDLE HYPO 25GX1X1/2 BEV (NEEDLE) ×4 IMPLANT
NS IRRIG 1000ML POUR BTL (IV SOLUTION) ×4 IMPLANT
PACK CV ACCESS (CUSTOM PROCEDURE TRAY) ×4 IMPLANT
PACK SURGICAL SETUP 50X90 (CUSTOM PROCEDURE TRAY) ×4 IMPLANT
PAD ARMBOARD 7.5X6 YLW CONV (MISCELLANEOUS) ×8 IMPLANT
SOAP 2 % CHG 4 OZ (WOUND CARE) ×4 IMPLANT
SUT ETHILON 3 0 PS 1 (SUTURE) ×4 IMPLANT
SUT PROLENE 6 0 CC (SUTURE) ×8 IMPLANT
SUT VIC AB 3-0 SH 27 (SUTURE) ×4
SUT VIC AB 3-0 SH 27X BRD (SUTURE) ×2 IMPLANT
SUT VICRYL 4-0 PS2 18IN ABS (SUTURE) ×4 IMPLANT
SYR 10ML LL (SYRINGE) ×4 IMPLANT
SYR 20ML LL LF (SYRINGE) ×4 IMPLANT
SYR 5ML LL (SYRINGE) ×8 IMPLANT
SYR CONTROL 10ML LL (SYRINGE) ×4 IMPLANT
TOWEL GREEN STERILE (TOWEL DISPOSABLE) ×8 IMPLANT
TOWEL GREEN STERILE FF (TOWEL DISPOSABLE) ×4 IMPLANT
UNDERPAD 30X36 HEAVY ABSORB (UNDERPADS AND DIAPERS) ×4 IMPLANT
WATER STERILE IRR 1000ML POUR (IV SOLUTION) ×4 IMPLANT

## 2020-09-24 NOTE — Transfer of Care (Signed)
Immediate Anesthesia Transfer of Care Note  Patient: Nyair Depaulo.  Procedure(s) Performed: CREATION OF LEFT ARM RADIOCEPHALIC ARTERIOVENOUS (AV) FISTULA (Left Arm Lower) INSERTION OF RIGHT INTERNAL JUGULAR TUNNELED DIALYSIS CATHETER (Right Neck)  Patient Location: PACU  Anesthesia Type:General  Level of Consciousness: awake and alert   Airway & Oxygen Therapy: Patient Spontanous Breathing and Patient connected to face mask oxygen  Post-op Assessment: Report given to RN and Post -op Vital signs reviewed and stable  Post vital signs: Reviewed and stable  Last Vitals:  Vitals Value Taken Time  BP 114/79 09/24/20 1636  Temp    Pulse 114   Resp 18 09/24/20 1638  SpO2 97   Vitals shown include unvalidated device data.  Last Pain:  Vitals:   09/24/20 1351  TempSrc:   PainSc: 0-No pain      Patients Stated Pain Goal: 0 (49/20/10 0712)  Complications: No complications documented.

## 2020-09-24 NOTE — Progress Notes (Signed)
PROGRESS NOTE    [place].  NOI:[id number] DOB: 05-03-1951 DOA: 09/08/2020 PCP: Aura Dials, MD   Chief Complaint  Patient presents with  . Wound Infection    Brief Narrative:  69 year old male with Q9IH, chronic systolic and diastolic CHF, PAF supposed to be on Eliquis, HTN, morbid obesity brought to ED 09/08/2020 after he was found down in his house.  Reportedly patient was followed.  Protective services, has very unsanitary living condition at home and was found down in the bathroom during welfare check. Per report: Patient reports falling while standing up from bathroom without LOC, resulting in fall onto R chest and resultant R lower chest pain. Patient was found down and brought into ED.  Of note, patient reports stopping all medications several months ago with exception of torsemide because "I didn't think I needed them anymore." Pt has only been taking torsemide PRN swelling.  He was found to have necrotic foot infections and underwent xray of right foot, which revealed subcortical lucency involving 5th digit concerning for OM.  He was started on vancomycin and cefepime and orthopedic surgery was consulted. ABIs were also ordered. Test was inconclusive due to severe lower extremity edema.   Initially patient was also against surgical treatment options despite severity of his wounds, their obvious chronicity, and inability to explain consequences for avoiding surgery. We requested psychiatry to evaluate patient for decision making ability in this setting. After repeat explanation of his diagnosis and possible treatment options he was able to voice adequate understanding to psychiatry and was considered to have decision making capacity regarding his current infection.   He will need to continue with serial compression wrappings at discharge in efforts to promote better chance of wound healing and proper assessment of ABIs to help decide best treatment course.   Given  his significant deconditioning and after discussion with his daughter, there is concern for his ability to safely return home due to poor living situation; Physical therapy was consulted. After PT evaluation he was recommended for SNF at discharge. He was accepting of this and placement was pursued.  Overall, he had good clinical response to IV antibiotics. He was deescalated to Augmentin and doxycycline to finish out a course. He will continue with serial compression wrappings at discharge to rehab as well as following up with Ortho for further management.  During hospitalization he was started on IV Lasix for diuresis given his severe lower extremity edema. His creatinine continued to slowly uptrend. Urine output measuring was inaccurate and difficult to gauge response. Nephrology was consulted on 09/14/2020 given his uptrending creatinine in setting of multiple antibiotics initially and some transient hypotension.  Renal ultrasound showed no hydronephrosis.  He underwent temporary HD catheter placement by IR on 09/16/2020 and nephrology recommended transfer to [facility] for possible short-term hemodialysis. Noncompliant in general and very self-neglected with denial component. Admitted with horrendous feet/legs. Has osteo in R foot 5th digit. Very well possibly in left foot too chronically.  Too edematous for good ABI's per ortho so getting serial compression wrapping with plans for outpt follow up with ortho (on augmentin/doxy now for OM tx).  Patient was transferred to [facility] 11/3 and dialysis was started. Patient underwent dialysis multiple sessions, no renal recovery.  Some urine output and hematuria. Seen by palliative care and ongoing discussion involving family members.  Patient wanted to proceed with dialysis on outpatient basis.  Subjective:  Seen this morning patient has no new complaints.  Wound care nurses  at the bedside changing his dressing/Unna boot AND foam  wrap No more bleeding or hematuria. Afebrile overnight intermittently heart rate tachycardic, hemoglobin stable 10.5  Assessment & Plan:  AKI on CKD stage III with baseline creatinine 1.3-1.9: Seen by Nephrology -etiology multifactorial including cardiorenal syndrome, ATN.  Now on dialysis since 11/3 and tolerating well, at times episode of hypotension and is started on midodrine.  Try to high-dose IV Lasix without response, at this time no renal recovery, family meeting convened 11/9 plan is to proceed with dialysis and permanent access.  Vascular surgery planning for AV fistula creation and tunneled catheter placement 11/10 in OR.  Social worker on board for SNF and and outpatient HD being pursued.  Acute on chronic combined systolic and diastolic CHF: LVEF 08-67% in LVEF, previously back in March EF was 40-45%: TTE shows lvef 30-35%, severe concentric LVH, global hypokinesia.  Now on dialysis.   Hypotension: BP stable , BP soft during dialysis, continue midodrine.  Resume low-dose metoprolol for A. fib   Chronic lymphedema/ followed by wound care.  Continue Unna boot, weekly dressing changes outpatient follow-up with wound care wound center SNF  Extensive bilateral lower extremity edema wound on right foot,Osteomyelitis of fifth toe of right foot per x-ray, ABI indeterminate due to lower extremity edema.  Seen by Dr. Sharol Given offered amputation but patient refused.  Family agreed with patient decision not to pursue amputation.  Plan is to continue aggressive wound care, outpatient doxycycline long-term, follow-up with Dr. Sharol Given as outpatient, SNF placement.  Discussed w/ wound nurse today and swelling in LE is improving.   Falls at home/neuropathy: Continue PT OT, will need skilled nursing facility placement.  Hypokalemia resolved  Severe sepsis YPP:JKDTOI is resolved.  Acute encephalopathy, uremic/septic on presentation per chArt. At this time mental status is stable.    PAF with  ZTI:[id number] with meds.resume low-dose metoprolol. on heparin but held briefly 11/9-due to bleeding from the HD access site.  Resume once okay with vascular/IR-hopefully can switch to oral Eliquis if okay with nephrology  Hematuria after placing foley catheter suspect catheter related trauma: off Foley 11/9- continue to 6-hour bladder scan.   Hemoglobin is stable 10 to 11 g  Type 2 diabetes mellitus: A1c uncontrolled 9.6 10/25.  Blood sugar stable on sliding scale insulin  Recent Labs  Lab 09/23/20 0650 09/23/20 1156 09/23/20 1640 09/23/20 2140 09/24/20 0724  GLUCAP 98 104* 106* 114* 90   Morbid obesity with BMI 43>41, will benefit with weight loss/healthy lifestyle and outpatient sleep apnea evaluation.    GOC: Family meeting completed  09/23/20 morning plan is to proceed with dialysis outpatient skilled nursing facility rehab.  Patient is at risk of decompensation will need close follow-up monitoring continue dialysis as per nephrology.  He has refused amputation and will need to follow-up with Dr. Sharol Given and continue on long-term antibiotics.    Nutrition: Diet Order            Diet NPO time specified  Diet effective midnight                 Nutrition Problem: Increased nutrient needs Etiology: wound healing, acute illness Signs/Symptoms: estimated needs Interventions: MVI, Other (Comment) (Prosource Plus)  Body mass index is 41.31 kg/m.  Pressure Ulcer: Pressure Injury 09/08/20 Heel Right Stage 3 -  Full thickness tissue loss. Subcutaneous fat may be visible but bone, tendon or muscle are NOT exposed. 25% red, 75% slouogh, strong odor, mod brown (Active)  09/08/20   Location:  Heel  Location Orientation: Right  Staging: Stage 3 -  Full thickness tissue loss. Subcutaneous fat may be visible but bone, tendon or muscle are NOT exposed.  Wound Description (Comments): 25% red, 75% slouogh, strong odor, mod brown  Present on Admission: Yes     Pressure Injury 09/17/20  Buttocks Right;Left Deep Tissue Pressure Injury - Purple or maroon localized area of discolored intact skin or blood-filled blister due to damage of underlying soft tissue from pressure and/or shear. (Active)  09/17/20 1805  Location: Buttocks  Location Orientation: Right;Left  Staging: Deep Tissue Pressure Injury - Purple or maroon localized area of discolored intact skin or blood-filled blister due to damage of underlying soft tissue from pressure and/or shear.  Wound Description (Comments):   Present on Admission:    DVT prophylaxis: heparin gtt Code Status:  Code Status: Full Code  Family Communication: plan of care discussed with patient.  Daughters have been updated multiple times.  Family meeting completed 09/23/2020 at bedside along with nephrology and palliative care be RN.  Status NL:[id number] Remains inpatient appropriate because:IV treatments appropriate due to intensity of illness or inability to take PO and Inpatient level of care appropriate due to severity of illness  Dispo:The patient is from: Home            Anticipated d/c is to: SNF            Anticipated d/c date is: 2 days            Patient currently is not medically stable to d/c. Consultants:see note  Procedures:see note  Culture/Microbiology    Component Value Date/Time   SDES  09/08/2020 2103    BLOOD LEFT HAND Performed at [facility], [place] [address] [place], [place] [address]    SPECREQUEST  09/08/2020 2103    BOTTLES DRAWN AEROBIC AND ANAEROBIC Blood Culture adequate volume Performed at [place] [address] [place], [place] [address]    CULT  09/08/2020 2103    NO GROWTH 5 DAYS Performed at [place] [address] [place], [place] [address]    REPTSTATUS 09/13/2020 FINAL 09/08/2020 2103    Other culture-see note  Medications: Scheduled Meds: . (feeding supplement) PROSource Plus  30 mL Oral QID  . amoxicillin-clavulanate  1 tablet  Oral Daily  . Chlorhexidine Gluconate Cloth  6 each Topical Daily  . Chlorhexidine Gluconate Cloth  6 each Topical Q0600  . doxycycline  100 mg Oral Q12H  . insulin aspart  0-15 Units Subcutaneous TID WC  . insulin aspart  0-5 Units Subcutaneous QHS  . midodrine  10 mg Oral TID WC  . multivitamin  1 tablet Oral QHS  . Ensure Max Protein  11 oz Oral BID  . traZODone  25 mg Oral QHS   Continuous Infusions: . heparin 1,500 Units/hr (09/24/20 0800)    Antimicrobials: Anti-infectives (From admission, onward)   Start     Dose/Rate Route Frequency Ordered Stop   09/17/20 1000  amoxicillin-clavulanate (AUGMENTIN) 500-125 MG per tablet 500 mg        1 tablet Oral Daily 09/17/20 0839     09/14/20 2200  amoxicillin-clavulanate (AUGMENTIN) 500-125 MG per tablet 500 mg  Status:  Discontinued        1 tablet Oral 2 times daily 09/14/20 1524 09/17/20 0839   09/12/20 1000  amoxicillin-clavulanate (AUGMENTIN) 875-125 MG per tablet 1 tablet  Status:  Discontinued        1 tablet  Oral Every 12 hours 09/12/20 0823 09/14/20 1524   09/12/20 1000  doxycycline (VIBRA-TABS) tablet 100 mg        100 mg Oral Every 12 hours 09/12/20 0823     09/10/20 2000  DAPTOmycin (CUBICIN) 740 mg in sodium chloride 0.9 % IVPB  Status:  Discontinued        740 mg 229.6 mL/hr over 30 Minutes Intravenous Daily 09/10/20 1230 09/12/20 0822   09/10/20 1200  metroNIDAZOLE (FLAGYL) IVPB 500 mg  Status:  Discontinued        500 mg 100 mL/hr over 60 Minutes Intravenous Every 8 hours 09/10/20 1050 09/12/20 0822   09/10/20 1132  vancomycin variable dose per unstable renal function (pharmacist dosing)  Status:  Discontinued         Does not apply See admin instructions 09/10/20 1132 09/10/20 1230   09/09/20 1700  vancomycin (VANCOREADY) IVPB 1500 mg/300 mL  Status:  Discontinued        1,500 mg 150 mL/hr over 120 Minutes Intravenous Every 24 hours 09/08/20 1956 09/10/20 1132   09/09/20 1000  ceFEPIme (MAXIPIME) 2 g in sodium  chloride 0.9 % 100 mL IVPB  Status:  Discontinued        2 g 200 mL/hr over 30 Minutes Intravenous Every 12 hours 09/08/20 1947 09/12/20 0822   09/08/20 2200  ceFEPIme (MAXIPIME) 2 g in sodium chloride 0.9 % 100 mL IVPB        2 g 200 mL/hr over 30 Minutes Intravenous  Once 09/08/20 1754 09/08/20 2206   09/08/20 1800  vancomycin (VANCOCIN) IVPB 1000 mg/200 mL premix  Status:  Discontinued        1,000 mg 200 mL/hr over 60 Minutes Intravenous  Once 09/08/20 1754 09/08/20 1756   09/08/20 1630  vancomycin (VANCOREADY) IVPB 2000 mg/400 mL        2,000 mg 200 mL/hr over 120 Minutes Intravenous  Once 09/08/20 1551 09/08/20 1842   09/08/20 1600  piperacillin-tazobactam (ZOSYN) IVPB 3.375 g        3.375 g 100 mL/hr over 30 Minutes Intravenous  Once 09/08/20 1549 09/08/20 1645     Objective: Vitals: Today's Vitals   09/23/20 2020 09/23/20 2217 09/24/20 0449 09/24/20 0800  BP:   91/74   Pulse: 72  77   Resp: 20  20   Temp:   98 F (36.7 C)   TempSrc:   Oral   SpO2: 96%  100%   Weight:      Height:      PainSc:  Asleep  0-No pain    Intake/Output Summary (Last 24 hours) at 09/24/2020 1122 Last data filed at 09/24/2020 0800 Gross per 24 hour  Intake 1255.19 ml  Output 1 ml  Net 1254.19 ml   Filed Weights   09/20/20 2048 09/21/20 0500 09/22/20 0750  Weight: 128.5 kg 128.5 kg 126.9 kg   Weight change:   Intake/Output from previous day: 11/09 0701 - 11/10 0700 In: 1225.2 [P.O.:1021; I.V.:204.2] Out: 1 [Urine:1] Intake/Output this shift: Total I/O In: 30 [I.V.:30] Out: -   Examination: General exam: AAOx3 , NAD, weak appearing. HEENT:Oral mucosa moist, Ear/Nose WNL grossly, dentition normal. Respiratory system: bilaterally clear,no wheezing or crackles,no use of accessory muscle Cardiovascular system: S1 & S2 +, No JVD,. Gastrointestinal system: Abdomen soft, NT,ND, BS+ Nervous System:Alert, awake, moving extremities and grossly nonfocal Extremities: Chronic lymphedema on  lower extremity with Unna boot and dressing, examined the wound has 2 nonhealing wound on the foot see  picture, it edema lymphedema lower extremities improving per wound nurse Skin: No rashes,no icterus. MSK: Normal muscle bulk,tone, power          Data Reviewed: I have personally reviewed following labs and imaging studies CBC: Recent Labs  Lab 09/19/20 0407 09/19/20 0407 09/20/20 0503 09/20/20 0503 09/21/20 0321 09/22/20 0301 09/23/20 0340 09/23/20 1127 09/24/20 0410  WBC 9.8   < > 10.5  --  8.2 8.1 6.5  --  6.1  NEUTROABS 6.6  --  7.1  --  6.2 5.5 3.7  --   --   HGB 10.5*   < > 11.3*   < > 10.6* 11.0* 11.0* 10.5* 10.5*  HCT 33.2*   < > 36.3*   < > 34.0* 36.4* 36.5* 35.0* 33.9*  MCV 86.2   < > 86.2  --  85.6 88.1 88.6  --  89.0  PLT 304   < > 348  --  299 291 252  --  243   < > = values in this interval not displayed.   Basic Metabolic Panel: Recent Labs  Lab 09/18/20 0202 09/18/20 0202 09/19/20 0407 09/20/20 0503 09/21/20 0321 09/22/20 0301 09/23/20 0340  NA 136   < > 134* 134* 135 137 137  K 3.7   < > 3.2* 3.5 3.1* 3.9 3.6  CL 104   < > 98 99 101 104 102  CO2 18*   < > 23 24 21* 19* 22  GLUCOSE 159*   < > 126* 130* 96 116* 110*  BUN 85*   < > 52* 39* 49* 63* 52*  CREATININE 6.09*   < > 5.03* 4.79* 6.05* 6.93* 6.15*  CALCIUM 8.0*   < > 7.9* 8.3* 8.5* 8.7* 8.3*  MG 1.7  --  1.8 1.8  --   --   --   PHOS 7.0*  --  6.7* 5.7*  --   --   --    < > = values in this interval not displayed.   GFR: Estimated Creatinine Clearance: 14.5 mL/min (A) (by C-G formula based on SCr of 6.15 mg/dL (H)). Liver Function Tests: No results for input(s): AST, ALT, ALKPHOS, BILITOT, PROT, ALBUMIN in the last 168 hours. No results for input(s): LIPASE, AMYLASE in the last 168 hours. No results for input(s): AMMONIA in the last 168 hours. Coagulation Profile: No results for input(s): INR, PROTIME in the last 168 hours. Cardiac Enzymes: No results for input(s): CKTOTAL, CKMB,  CKMBINDEX, TROPONINI in the last 168 hours. BNP (last 3 results) No results for input(s): PROBNP in the last 8760 hours. HbA1C: No results for input(s): HGBA1C in the last 72 hours. CBG: Recent Labs  Lab 09/23/20 0650 09/23/20 1156 09/23/20 1640 09/23/20 2140 09/24/20 0724  GLUCAP 98 104* 106* 114* 90   Lipid Profile: No results for input(s): CHOL, HDL, LDLCALC, TRIG, CHOLHDL, LDLDIRECT in the last 72 hours. Thyroid Function Tests: No results for input(s): TSH, T4TOTAL, FREET4, T3FREE, THYROIDAB in the last 72 hours. Anemia Panel: No results for input(s): VITAMINB12, FOLATE, FERRITIN, TIBC, IRON, RETICCTPCT in the last 72 hours. Sepsis Labs: No results for input(s): PROCALCITON, LATICACIDVEN in the last 168 hours.  No results found for this or any previous visit (from the past 240 hour(s)).   Radiology Studies: No results found.   LOS: 16 days   Antonieta Pert, MD [facility]  09/24/2020, 11:22 AM

## 2020-09-24 NOTE — Progress Notes (Signed)
At patient bedside explained to patient the need for him to lay down for safety reasons for HD tx otherwise he will be taken off the HD machine and returned to his room per Dr. Luis Abed orders. Patient did comply and lay down on the bed BP 132/63 P-96 UF turned back on. Pt without distress. 45 minutes of HD tx left

## 2020-09-24 NOTE — Anesthesia Preprocedure Evaluation (Signed)
Anesthesia Evaluation  Patient identified by MRN, date of birth, ID band Patient awake    Reviewed: Allergy & Precautions, NPO status , Patient's Chart, lab work & pertinent test results  Airway Mallampati: II  TM Distance: >3 FB Neck ROM: Full    Dental  (+) Missing, Caps   Pulmonary neg pulmonary ROS,    Pulmonary exam normal breath sounds clear to auscultation       Cardiovascular hypertension, +CHF  Normal cardiovascular exam Rhythm:Regular Rate:Normal  09/18/20 Echo: 1. Heavy smoke in the left ventricle consistent with pre-thrombotic  state, no thrombus was seen on Definity echocontrast images.  2. Left ventricular ejection fraction, by estimation, is 30 to 35%. The  left ventricle has moderately decreased function. The left ventricle  demonstrates global hypokinesis. There is severe concentric left  ventricular hypertrophy. Left ventricular  diastolic function could not be evaluated. There is the interventricular  septum is flattened in systole and diastole, consistent with right  ventricular pressure and volume overload.  3. Right ventricular systolic function is moderately reduced. The right  ventricular size is moderately enlarged. There is mildly elevated  pulmonary artery systolic pressure. The estimated right ventricular  systolic pressure is 35.4 mmHg.  4. Left atrial size was severely dilated.  5. Right atrial size was moderately dilated.  6. The mitral valve is normal in structure. Mild mitral valve  regurgitation. No evidence of mitral stenosis.  7. Tricuspid valve regurgitation is moderate.  8. The aortic valve is normal in structure. Aortic valve regurgitation is  not visualized. No aortic stenosis is present.  9. The inferior vena cava is normal in size with greater than 50%  respiratory variability, suggesting right atrial pressure of 3 mmHg.    Neuro/Psych negative neurological ROS      GI/Hepatic GERD  ,(+) Hepatitis -  Endo/Other  negative endocrine ROSdiabetes, Type 2  Renal/GU ARFRenal disease     Musculoskeletal  (+) Arthritis ,   Abdominal   Peds  Hematology negative hematology ROS (+)   Anesthesia Other Findings Day of surgery medications reviewed with the patient.  Reproductive/Obstetrics                             Anesthesia Physical Anesthesia Plan  ASA: IV  Anesthesia Plan: General   Post-op Pain Management:    Induction: Intravenous  PONV Risk Score and Plan: 2 and Ondansetron and Dexamethasone  Airway Management Planned: LMA  Additional Equipment:   Intra-op Plan:   Post-operative Plan: Extubation in OR  Informed Consent: I have reviewed the patients History and Physical, chart, labs and discussed the procedure including the risks, benefits and alternatives for the proposed anesthesia with the patient or authorized representative who has indicated his/her understanding and acceptance.       Plan Discussed with: CRNA  Anesthesia Plan Comments:         Anesthesia Quick Evaluation

## 2020-09-24 NOTE — Progress Notes (Signed)
Improved Bp with UF off 108/59 pt states will continue to sit up even though no more fluid removal can occur with him doing so. Called on-call Nephrologist Dr. Reeves Dam per Dr. Royce Macadamia expain to pt if he doesn't lay down for safety reasons during Dialysis he will be rinsed back and returned to his room even though he has 1hour of HD Tx left.

## 2020-09-24 NOTE — Progress Notes (Signed)
Pt off unit

## 2020-09-24 NOTE — Consult Note (Signed)
[place] Nurse wound follow up Patient receiving care in [facility] [id number].  Patient able to transfer self from chair to bed, back to chair required use of a rolling walker. Wound type: full thickness wound with eschar to plantar surface of right heel.  Measures 8 cm x 6 cm and is 25% eschar.  Right 5th MH, plantar surface with full thickness, necrotic wound with slough that measures 3 cm x 3 cm x 0.8 cm.  Both of these wounds were covered with square foam dressings before applying 4 layer Profore wrap. Measurement: Wound bed: Drainage (amount, consistency, odor) heavy foul odor  Periwound: heavy lichenification  bilaterally Dressing procedure/placement/frequency: as above.  Profores will need to be changed next week on Wednesday if patient remains in hospital. Val Riles, RN, MSN, [facility], [place], CNS-BC, pager [phone]

## 2020-09-24 NOTE — Progress Notes (Addendum)
[facility] NEPHROLOGY PROGRESS NOTE  Assessment/ Plan: Pt is a 69 y.o. yo male with history of CHF, DM, A. fib, hypertension found down at home during welfare check currently followed by APS.  In ED he had A. fib with RVR, diabetic foot infection and acute kidney injury.  #Acute kidney injury on CKD stage IIIb: Baseline creatinine level 1.3-1.9.  AKI multifactorial etiology including cardiorenal syndrome, ATN.  Started dialysis on 11/3 and has been tolerating well.  Tried high-dose IV diuretics without response.   No sign of renal recovery.  We had a meeting in the presence of palliative care on 11/9 with the patient and his daughter.  They agreed to dialysis and permanent access. Vein and vascular surgery is planning AV fistula creation and tunneled catheter placement today in the OR. HD today. He will also need outpatient HD arranged for AKI.  Discussed with Education officer, museum.  #CHF with systolic and diastolic dysfunction: Volume overload causing cardiorenal syndrome.  Did not respond with IV diuretics therefore now volume management with dialysis. Discontinue diuretics.  #Hypertension: On metoprolol for A. fib.  Requiring midodrine for intradialytic hypotension.  #Sepsis with osteomyelitis of fifth toe on the right foot: On antibiotics per primary team.  #Anemia due to sepsis, AKI: Hemoglobin at goal.  #Metabolic acidosis: Managed with dialysis now.  #Bleeding from tunnel catheter site: Not further bleeding.  The temporary catheter will be removed today.  Subjective: Seen and examined at bedside.  No new event.  Plans to go to the OR today.  Denies nausea vomiting chest pain shortness of breath. Objective Vital signs in last 24 hours: Vitals:   09/23/20 1714 09/23/20 2019 09/23/20 2020 09/24/20 0449  BP: 104/69 101/86  91/74  Pulse: (!) 58 (!) 128 72 77  Resp: 19 20 20 20   Temp: 98.3 F (36.8 C) 98.8 F (37.1 C)  98 F (36.7 C)  TempSrc:  Oral  Oral  SpO2: 97% 96%  96% 100%  Weight:      Height:       Weight change:   Intake/Output Summary (Last 24 hours) at 09/24/2020 1012 Last data filed at 09/24/2020 0800 Gross per 24 hour  Intake 1255.19 ml  Output 1 ml  Net 1254.19 ml       Labs: Basic Metabolic Panel: Recent Labs  Lab 09/18/20 0202 09/18/20 0202 09/19/20 0407 09/19/20 0407 09/20/20 0503 09/20/20 0503 09/21/20 0321 09/22/20 0301 09/23/20 0340  NA 136   < > 134*   < > 134*   < > 135 137 137  K 3.7   < > 3.2*   < > 3.5   < > 3.1* 3.9 3.6  CL 104   < > 98   < > 99   < > 101 104 102  CO2 18*   < > 23   < > 24   < > 21* 19* 22  GLUCOSE 159*   < > 126*   < > 130*   < > 96 116* 110*  BUN 85*   < > 52*   < > 39*   < > 49* 63* 52*  CREATININE 6.09*   < > 5.03*   < > 4.79*   < > 6.05* 6.93* 6.15*  CALCIUM 8.0*   < > 7.9*   < > 8.3*   < > 8.5* 8.7* 8.3*  PHOS 7.0*  --  6.7*  --  5.7*  --   --   --   --    < > =  values in this interval not displayed.   Liver Function Tests: No results for input(s): AST, ALT, ALKPHOS, BILITOT, PROT, ALBUMIN in the last 168 hours. No results for input(s): LIPASE, AMYLASE in the last 168 hours. No results for input(s): AMMONIA in the last 168 hours. CBC: Recent Labs  Lab 09/20/20 0503 09/20/20 0503 09/21/20 0321 09/21/20 0321 09/22/20 0301 09/22/20 0301 09/23/20 0340 09/23/20 1127 09/24/20 0410  WBC 10.5   < > 8.2   < > 8.1  --  6.5  --  6.1  NEUTROABS 7.1   < > 6.2  --  5.5  --  3.7  --   --   HGB 11.3*   < > 10.6*   < > 11.0*   < > 11.0* 10.5* 10.5*  HCT 36.3*   < > 34.0*   < > 36.4*   < > 36.5* 35.0* 33.9*  MCV 86.2  --  85.6  --  88.1  --  88.6  --  89.0  PLT 348   < > 299   < > 291  --  252  --  243   < > = values in this interval not displayed.   Cardiac Enzymes: No results for input(s): CKTOTAL, CKMB, CKMBINDEX, TROPONINI in the last 168 hours. CBG: Recent Labs  Lab 09/23/20 0650 09/23/20 1156 09/23/20 1640 09/23/20 2140 09/24/20 0724  GLUCAP 98 104* 106* 114* 90     Iron Studies: No results for input(s): IRON, TIBC, TRANSFERRIN, FERRITIN in the last 72 hours. Studies/Results: No results found.  Medications: Infusions: . heparin 1,500 Units/hr (09/24/20 0800)    Scheduled Medications: . (feeding supplement) PROSource Plus  30 mL Oral QID  . amoxicillin-clavulanate  1 tablet Oral Daily  . Chlorhexidine Gluconate Cloth  6 each Topical Daily  . Chlorhexidine Gluconate Cloth  6 each Topical Q0600  . doxycycline  100 mg Oral Q12H  . insulin aspart  0-15 Units Subcutaneous TID WC  . insulin aspart  0-5 Units Subcutaneous QHS  . midodrine  10 mg Oral TID WC  . multivitamin  1 tablet Oral QHS  . Ensure Max Protein  11 oz Oral BID  . traZODone  25 mg Oral QHS    have reviewed scheduled and prn medications.  Physical Exam: General:NAD, comfortable Heart:RRR, s1s2 nl Lungs: Basal decreased breath sound Abdomen:soft, Non-tender, non-distended Extremities: Edema present and chronic stasis changes. Dialysis Access: Neck temporary HD catheter, not bleeding Neurology: Alert awake and oriented x3.  Opal [name] Prasad Lyna Laningham 09/24/2020,10:12 AM  LOS: 16 days  Pager: [phone]

## 2020-09-24 NOTE — Progress Notes (Signed)
[facility] [place] application completed and submitted.  Alphonzo Cruise, [place] Renal Navigator [phone]

## 2020-09-24 NOTE — Progress Notes (Signed)
ANTICOAGULATION CONSULT NOTE - Follow Up Consult  Pharmacy Consult for IV Heparin Indication: atrial fibrillation  Allergies  Allergen Reactions  . Codeine Swelling    Water retention  . Sulfur Other (See Comments)    Not sure of reaction a young child    Patient Measurements: Height: 5\' 9"  (175.3 cm) Weight: 126.9 kg (279 lb 12.2 oz) IBW/kg (Calculated) : 70.7 Heparin Dosing Weight: 93 kg  Vital Signs: Temp: 97.5 F (36.4 C) (11/10 1715) Temp Source: Tympanic (11/10 1715) BP: 111/97 (11/10 1715) Pulse Rate: 109 (11/10 1715)  Labs: Recent Labs    09/22/20 0301 09/22/20 0301 09/23/20 0340 09/23/20 0340 09/23/20 1127 09/24/20 0410 09/24/20 1258  HGB 11.0*   < > 11.0*   < > 10.5* 10.5*  --   HCT 36.4*   < > 36.5*  --  35.0* 33.9*  --   PLT 291  --  252  --   --  243  --   HEPARINUNFRC 0.59   < > 0.74*  --   --  0.46 0.35  CREATININE 6.93*  --  6.15*  --   --   --   --    < > = values in this interval not displayed.    Estimated Creatinine Clearance: 14.5 mL/min (A) (by C-G formula based on SCr of 6.15 mg/dL (H)).  Assessment: 69 yr old man admitted on 09/08/20 after a fall and was found to have osteomyelitis of his feet. PMH significant for atrial fibrillation, on apixaban. Patient was transitioned from apixaban to heparin for possible surgery. He declined surgery and was restarted on apixaban 09/10/20.  Pharmacy was consulted for transition back to IV heparin on 11/1 for AKI and HD catheter replacement. Last dose of apixaban given 11/1, S/P HD cath placement on 11/2. AKI, S/P HD and UF, currently on diuretics.  The patient has had some UOP in response to diuretics - ~300cc/24h - which was charted as bloody/dark red. Also of note patient has had bleeding around central line on 11/8, which worsened 11/9 and heparin was held. Heparin was restarted later in the day.  Pt is S/P R IJ tunneled HD catheter placement by [facility] this afternoon. Dr. Donnetta Hutching said okay to resume  heparin infusion at midnight tonight. Pt had 2 therapeutic heparin levels on heparin infusion at 1500 units/hr prior to procedure.   H/H today 10.5/33.9, platelets 243. Per RN, no bleeding issues observed post procedure.  Goal of Therapy:  Heparin level 0.3-0.5 units/ml Monitor platelets by anticoagulation protocol: Yes   Plan:  Restart heparin infusion at 1500 units/hr at midinight tonight Check heparin level 8 hrs after restarting heparin infusion Monitor daily heparin level, CBC Monitor for signs/symptoms of bleeding F/U plans for oral anticoagulant after procedures are complete   Gillermina Hu, PharmD, BCPS, [facility] Clinical Pharmacist 09/24/20, 18:25 PM

## 2020-09-24 NOTE — Progress Notes (Signed)
ANTICOAGULATION CONSULT NOTE - Follow Up Consult  Pharmacy Consult for IV Heparin Indication: atrial fibrillation  Allergies  Allergen Reactions  . Codeine Swelling    Water retention  . Sulfur Other (See Comments)    Not sure of reaction a young child    Patient Measurements: Height: 5\' 9"  (175.3 cm) Weight: 126.9 kg (279 lb 12.2 oz) IBW/kg (Calculated) : 70.7 Heparin Dosing Weight: 93 kg  Vital Signs: Temp: 98 F (36.7 C) (11/10 0449) Temp Source: Oral (11/10 0449) BP: 91/74 (11/10 0449) Pulse Rate: 77 (11/10 0449)  Labs: Recent Labs    09/22/20 0301 09/22/20 0301 09/23/20 0340 09/23/20 0340 09/23/20 1127 09/24/20 0410  HGB 11.0*   < > 11.0*   < > 10.5* 10.5*  HCT 36.4*   < > 36.5*  --  35.0* 33.9*  PLT 291  --  252  --   --  243  HEPARINUNFRC 0.59  --  0.74*  --   --  0.46  CREATININE 6.93*  --  6.15*  --   --   --    < > = values in this interval not displayed.    Estimated Creatinine Clearance: 14.5 mL/min (A) (by C-G formula based on SCr of 6.15 mg/dL (H)).  Assessment: 69yoM admitted after a fall and was found to have osteomyelitis of his feet. PMH s/f atrial fibrillation on apixaban. Patient was transitioned from apixaban to heparin for possible surgery. He declined surgery and was restarted on apixaban 09/10/20.  Pharmacy was consulted for transition back to IV heparin on 11/1 for AKI and HD catheter replacement. Last dose of apixaban given 11/1, s/p HD cath placement on 11/2. AKI, s/p HD and UF, currently on diuretics.  Heparin level this morning is supratherapeutic at 0.74 (new goal 0.3-0.5).   The patient has had some UOP in response to diuretics - ~300cc/24h - which was charted as bloody/dark red. Also of note patient has had bleeding around central line on 11/8 which has worsened today 11/9.   11/10 AM update:  Heparin level therapeutic after re-starting Hgb stable at 10.5 from yesterday  Goal of Therapy:  Heparin level 0.3-0.5  units/ml Monitor platelets by anticoagulation protocol: Yes   Plan:  -Cont heparin at 1500 units/hr -1200 heparin level  Narda Bonds, PharmD, BCPS Clinical Pharmacist Phone: [phone]

## 2020-09-24 NOTE — Plan of Care (Signed)
  Problem: Health Behavior/Discharge Planning: Goal: Ability to manage health-related needs will improve Outcome: Progressing   Problem: Clinical Measurements: Goal: Will remain free from infection Outcome: Progressing   Problem: Skin Integrity: Goal: Risk for impaired skin integrity will decrease Outcome: Progressing   

## 2020-09-24 NOTE — TOC Initial Note (Signed)
Transition of Care (TOC) - Initial/Assessment Note    Patient Details  Name: [name]. MRN: [id number] Date of Birth: 29-Nov-1950  Transition of Care [facility]) CM/SW Contact:    Sable Feil, LCSW Phone Number: 09/24/2020, 4:28 PM  Clinical Narrative:  Talked with daughter [name] ([phone]) by phone regarding her dad and the recommendation of ST rehab. She was already aware of the recommendation and her facility preference is [facility], as her step-mom has been there recently. Daughter reported that she is very pleased with the rehab at [facility] and has talked with staff at the facility regarding her dad coming there ar discharge. Daughter informed that information will be sent to [facility] and she will be kept updated.                   Expected Discharge Plan: Skilled Nursing Facility Barriers to Discharge: Continued Medical Work up   Patient Goals and CMS Choice Patient states their goals for this hospitalization and ongoing recovery are:: Daughter wants her dad to get ST rehab before returning home as she works [email or web address].gov Compare Post Acute Care list provided to:: Other (Comment Required) (Daughter provided CSW with her preferred SNF - [facility]) Choice offered to / list presented to : Adult Children (Daughter provided CSW with her facility preference)  Expected Discharge Plan and Services Expected Discharge Plan: [place] In-house Referral: Clinical Social Work     Living arrangements for the past 2 months: Single Family Home                                      Prior Living Arrangements/Services Living arrangements for the past 2 months: Single Family Home Lives with:: Spouse Patient language and need for interpreter reviewed:: No Do you feel safe going back to the place where you live?: No      Need for Family Participation in Patient Care: Yes (Comment) Care giver support system in place?: No (comment)   Criminal Activity/Legal Involvement  Pertinent to Current Situation/Hospitalization: No - Comment as needed  Activities of Daily Living Home Assistive Devices/Equipment: Eyeglasses, Cane (specify quad or straight) ADL Screening (condition at time of admission) Patient's cognitive ability adequate to safely complete daily activities?: Yes Is the patient deaf or have difficulty hearing?: No Does the patient have difficulty seeing, even when wearing glasses/contacts?: No Does the patient have difficulty concentrating, remembering, or making decisions?: No Patient able to express need for assistance with ADLs?: Yes Does the patient have difficulty dressing or bathing?: No Independently performs ADLs?: Yes (appropriate for developmental age) Does the patient have difficulty walking or climbing stairs?: No Weakness of Legs: Both Weakness of Arms/Hands: Both  Permission Sought/Granted Permission sought to share information with : Other (comment) (Patient a bit confused; daughter contacted) Permission granted to share information with : Yes, Verbal Permission Granted  Share Information with NAME: Karie Schwalbe Daughter [phone]           Emotional Assessment Appearance:: Other (Comment Required (Did not visit with patient) Attitude/Demeanor/Rapport: Unable to Assess Affect (typically observed): Unable to Assess Orientation: : Oriented to Self, Oriented to Place, Oriented to  Time, Oriented to Situation (Oriented and confused) Alcohol / Substance Use: Tobacco Use, Alcohol Use, Illicit Drugs (Per H&P, patient quit smoking, drinks alcohol and does not use illicit drugs) Psych Involvement: No (comment)  Admission diagnosis:  Fall [W19.XXXA] Sepsis ([place]) [A41.9] Other acute osteomyelitis  of right foot [facility]) [M86.171] Patient Active Problem List   Diagnosis Date Noted  . Palliative care by specialist   . Goals of care, counseling/discussion   . Acute metabolic encephalopathy 33/38/3291  . Uncontrolled type 2 diabetes  mellitus with hyperglycemia ([place]) 09/16/2020  . Pressure injury of skin 09/12/2020  . Chronic edema 09/11/2020  . Osteomyelitis of fifth toe of right foot ([place]) 09/08/2020  . Acute renal failure superimposed on stage 3a chronic kidney disease ([place]) 09/08/2020  . Acute exacerbation of CHF (congestive heart failure) ([place]) 02/02/2020  . Atrial fibrillation with RVR ([place]) 01/31/2020  . Venous stasis dermatitis 11/19/2019  . Diabetic foot infection ([place]) 10/22/2019  . Lymphedema 10/22/2019  . Acute on chronic heart failure with preserved ejection fraction (HFpEF) ([place]) 06/20/2018  . Benign prostatic hyperplasia with urinary obstruction 01/30/2016  . Primary localized osteoarthrosis of pelvic region 01/29/2015  . Benign fibroma of prostate 06/17/2014  . Bladder neck obstruction 12/17/2013  . Elevated prostate specific antigen (PSA) 12/17/2013  . ED (erectile dysfunction) of organic origin 12/17/2013  . Decreased libido 12/17/2013  . Type 2 diabetes mellitus ([place]) 03/19/2013  . Essential hypertension 03/19/2013   PCP:  Aura Dials, MD Pharmacy:   [place], [place] - [address] [place] [place] [address] Phone: [phone] Fax: [phone]     Social Determinants of Health (SDOH) Interventions  No SDOH interventions requested or needed at this time.  Readmission Risk Interventions No flowsheet data found.

## 2020-09-24 NOTE — Interval H&P Note (Signed)
History and Physical Interval Note:  09/24/2020 1:56 PM  [name].  has presented today for surgery, with the diagnosis of ESRD.  The various methods of treatment have been discussed with the patient and family. After consideration of risks, benefits and other options for treatment, the patient has consented to  Procedure(s): LEFT ARM ARTERIOVENOUS (AV) FISTULA VERSUS ARTERIOVENOUS GORE-TEX GRAFT CREATION (Left) INSERTION OF DIALYSIS CATHETER (N/A) as a surgical intervention.  The patient's history has been reviewed, patient examined, no change in status, stable for surgery.  I have reviewed the patient's chart and labs.  Questions were answered to the patient's satisfaction.     Curt Jews

## 2020-09-24 NOTE — Progress Notes (Signed)
Patient is off unit. Going to Hemodialysis.

## 2020-09-24 NOTE — Progress Notes (Signed)
pt sitting on side of bed states back hurts inst bp drop and he needs to lay back pt refusal to lay back in bed even though sees drop in bp uf turned off to prevent further bp drop. pt states he doesn't care his bp is dropping d/t sitting up on bed he will not lay down on bed because his back and hip hurts. Pt received Dilaudid at 2041 for pain. Will monitor pt for safety

## 2020-09-24 NOTE — Anesthesia Procedure Notes (Signed)
Procedure Name: Intubation Date/Time: 09/24/2020 2:35 PM Performed by: Eligha Bridegroom, CRNA Pre-anesthesia Checklist: Patient identified, Emergency Drugs available, Suction available, Patient being monitored and Timeout performed Patient Re-evaluated:Patient Re-evaluated prior to induction Oxygen Delivery Method: Circle system utilized Preoxygenation: Pre-oxygenation with 100% oxygen Induction Type: IV induction and Rapid sequence Laryngoscope Size: Mac and 4 Grade View: Grade II Tube type: Oral Tube size: 8.0 mm Number of attempts: 1 Airway Equipment and Method: Stylet Placement Confirmation: ETT inserted through vocal cords under direct vision,  positive ETCO2 and breath sounds checked- equal and bilateral Secured at: 22 cm Tube secured with: Tape Dental Injury: Teeth and Oropharynx as per pre-operative assessment

## 2020-09-24 NOTE — Plan of Care (Signed)
°  Problem: Health Behavior/Discharge Planning: Goal: Ability to manage health-related needs will improve Outcome: Progressing   Problem: Skin Integrity: Goal: Risk for impaired skin integrity will decrease Outcome: Progressing   Problem: Education: Goal: Knowledge of disease and its progression will improve Outcome: Progressing   Problem: Clinical Measurements: Goal: Complications related to the disease process or treatment will be avoided or minimized Outcome: Progressing   Problem: Activity: Goal: Activity intolerance will improve Outcome: Progressing   Problem: Fluid Volume: Goal: Fluid volume balance will be maintained or improved Outcome: Progressing

## 2020-09-24 NOTE — Progress Notes (Signed)
PT Cancellation Note  Patient Details Name: [name]. MRN: [id number] DOB: September 14, 1951   Cancelled Treatment:    Reason Eval/Treat Not Completed: Patient declined, no reason specified attempted to work with patient, he politely declines due to "having a lot on my mind with everything they are going to do with me today", states he is happy to walk as far as we want after HD and his procedures today are done. Will try to attempt to return if time/schedule allow and if he is available later in day.    Windell Norfolk, DPT, PN1   Supplemental Physical Therapist [facility]    Pager [phone] Acute Rehab Office [phone]

## 2020-09-24 NOTE — Progress Notes (Signed)
Patient has been approved for [facility] [place] transportation. CSW updated.   Alphonzo Cruise, [place] Renal Navigator [phone]

## 2020-09-24 NOTE — Progress Notes (Signed)
Routine line care: Pt off the unit.

## 2020-09-24 NOTE — Progress Notes (Signed)
ANTICOAGULATION CONSULT NOTE - Follow Up Consult  Pharmacy Consult for IV Heparin Indication: atrial fibrillation  Allergies  Allergen Reactions  . Codeine Swelling    Water retention  . Sulfur Other (See Comments)    Not sure of reaction a young child    Patient Measurements: Height: 5\' 9"  (175.3 cm) Weight: 126.9 kg (279 lb 12.2 oz) IBW/kg (Calculated) : 70.7 Heparin Dosing Weight: 93 kg  Vital Signs: Temp: 98 F (36.7 C) (11/10 0449) Temp Source: Oral (11/10 0449) BP: 91/74 (11/10 0449) Pulse Rate: 77 (11/10 0449)  Labs: Recent Labs    09/22/20 0301 09/22/20 0301 09/23/20 0340 09/23/20 0340 09/23/20 1127 09/24/20 0410 09/24/20 1258  HGB 11.0*   < > 11.0*   < > 10.5* 10.5*  --   HCT 36.4*   < > 36.5*  --  35.0* 33.9*  --   PLT 291  --  252  --   --  243  --   HEPARINUNFRC 0.59   < > 0.74*  --   --  0.46 0.35  CREATININE 6.93*  --  6.15*  --   --   --   --    < > = values in this interval not displayed.    Estimated Creatinine Clearance: 14.5 mL/min (A) (by C-G formula based on SCr of 6.15 mg/dL (H)).  Assessment: 69yoM admitted after a fall and was found to have osteomyelitis of his feet. PMH s/f atrial fibrillation on apixaban. Patient was transitioned from apixaban to heparin for possible surgery. He declined surgery and was restarted on apixaban 09/10/20.  Pharmacy was consulted for transition back to IV heparin on 11/1 for AKI and HD catheter replacement. Last dose of apixaban given 11/1, s/p HD cath placement on 11/2. AKI, s/p HD and UF, currently on diuretics.  The patient has had some UOP in response to diuretics - ~300cc/24h - which was charted as bloody/dark red. Also of note patient has had bleeding around central line on 11/8 which worsened 11/9 and heparin was held. Heparin was restarted later in the day.  8-hr follow up heparin level therapeutic again at 0.35. CBC remains stable and no further bleeding is noted. Of note, plan is to have tunnel  dialysis catheter and AV fistula placed today 11/10.   Goal of Therapy:  Heparin level 0.3-0.5 units/ml Monitor platelets by anticoagulation protocol: Yes   Plan:  -Continue heparin at 1500 units/hr -Follow up with heparin restart after surgery  -Monitor daily HL, CBC -Monitor for bleeding -Follow up with plans for oral [facility] after procedures are complete   Mercy Riding, PharmD PGY1 Acute Care Pharmacy Resident Please refer to [facility] for unit-specific pharmacist

## 2020-09-24 NOTE — Op Note (Signed)
    OPERATIVE REPORT  DATE OF SURGERY: 09/24/2020  PATIENT: [name]., 69 y.o. male MRN: [id number]  DOB: 27-Jun-1951  PRE-OPERATIVE DIAGNOSIS: End-stage renal disease  POST-OPERATIVE DIAGNOSIS:  Same  PROCEDURE: #1 right IJ tunneled hemodialysis catheter placement, #2 left radiocephalic AV fistula creation  SURGEON:  Curt Jews, M.D.  PHYSICIAN ASSISTANT: Setzer PAC  The assistant was needed for exposure and to expedite the case  ANESTHESIA: General  EBL: per anesthesia record  Total I/O In: 530 [I.V.:30; IV Piggyback:500] Out: -   BLOOD ADMINISTERED: none  DRAINS: none  SPECIMEN: none  COUNTS CORRECT:  YES  PATIENT DISPOSITION:  PACU - hemodynamically stable  PROCEDURE DETAILS: Patient was taken up and placed to position with area of the right and left neck and chest chest were prepped draped you sterile fashion.  Patient had an indwelling temporary catheter.  Guidewire was passed over this catheter and the catheter was removed.  Fluoroscopy was used to confirm that the guidewire was in the distal right atrium.  Dilator and peel-away sheath was passed over the guidewire and the dilator and guidewire removed.  A separate stab incision was made at the level of the clavicle and the catheter was brought through the subcutaneous tunnel to the level of the peel-away sheath entry.  The catheter was passed down the peel-away sheath and the peel-away sheath was removed.  The catheter tips were positioned the level of distal right atrium.  Catheter was secured to the skin with a 3-0 nylon stitch and the entry site was closed with a 4-0 subcuticular Vicryl stitch.  Both catheters lumens flushed easily with heparinized saline and were locked with 1000/cc heparin  Attention was then turned to the left arm.  Patient did have a easily visible cephalic vein at the wrist and this was imaged with SonoSite ultrasound and was patent throughout the risk antecubital space and upper arm.   Incision was made between the level of the cephalic vein and radial artery at the wrist.  The cephalic vein was mobilized and tributary branches were ligated and divided.  The vein was ligated distally and divided and was gently dilated with heparinized saline was adequate for fistula attempt.  The vein was brought into approximation with the radial artery.  The artery had minimal atherosclerotic change and was of good caliber.  The radial artery was occluded proximally and distally and was opened with an 11 blade sent longstanding with Potts scissors.  The vein was cut to the appropriate length and was spatulated and sewn end-to-side to the artery with a running 6-0 Prolene suture.  Prior to completion of the closure a 2 dilator passed easily through the proximal and distal anastomosis.  Anastomosis was completed and clamps were removed with a good thrill noted.  The wounds irrigated with saline.  Hemostasis obtained after cautery.  The wounds were closed with 3-0 Vicryl in the subcutaneous and subcuticular tissue.  Sterile dressing was applied and the patient was transferred to the recovery room with chest x-ray pending   Rosetta Posner, M.D., [facility] 09/24/2020 4:22 PM

## 2020-09-24 NOTE — Progress Notes (Signed)
[facility])  Noted decision to pursue therapies that are more aligned with outpatient palliative services.  Per discussion with [name] over the weekend, will support with palliative care wherever he discharges to.  Venia Carbon RN, BSN, [facility] Liaison

## 2020-09-25 ENCOUNTER — Encounter (HOSPITAL_COMMUNITY): Payer: Self-pay | Admitting: Vascular Surgery

## 2020-09-25 DIAGNOSIS — M869 Osteomyelitis, unspecified: Secondary | ICD-10-CM | POA: Diagnosis not present

## 2020-09-25 LAB — CBC
HCT: 31.6 % — ABNORMAL LOW (ref 39.0–52.0)
Hemoglobin: 9.8 g/dL — ABNORMAL LOW (ref 13.0–17.0)
MCH: 26.5 pg (ref 26.0–34.0)
MCHC: 31 g/dL (ref 30.0–36.0)
MCV: 85.4 fL (ref 80.0–100.0)
Platelets: 186 10*3/uL (ref 150–400)
RBC: 3.7 MIL/uL — ABNORMAL LOW (ref 4.22–5.81)
RDW: 15.6 % — ABNORMAL HIGH (ref 11.5–15.5)
WBC: 6.3 10*3/uL (ref 4.0–10.5)
nRBC: 0 % (ref 0.0–0.2)

## 2020-09-25 LAB — GLUCOSE, CAPILLARY
Glucose-Capillary: 103 mg/dL — ABNORMAL HIGH (ref 70–99)
Glucose-Capillary: 114 mg/dL — ABNORMAL HIGH (ref 70–99)
Glucose-Capillary: 119 mg/dL — ABNORMAL HIGH (ref 70–99)
Glucose-Capillary: 126 mg/dL — ABNORMAL HIGH (ref 70–99)
Glucose-Capillary: 94 mg/dL (ref 70–99)

## 2020-09-25 LAB — BASIC METABOLIC PANEL
Anion gap: 13 (ref 5–15)
BUN: 39 mg/dL — ABNORMAL HIGH (ref 8–23)
CO2: 23 mmol/L (ref 22–32)
Calcium: 8 mg/dL — ABNORMAL LOW (ref 8.9–10.3)
Chloride: 101 mmol/L (ref 98–111)
Creatinine, Ser: 5.24 mg/dL — ABNORMAL HIGH (ref 0.61–1.24)
GFR, Estimated: 11 mL/min — ABNORMAL LOW (ref >60)
Glucose, Bld: 100 mg/dL — ABNORMAL HIGH (ref 70–99)
Potassium: 3.5 mmol/L (ref 3.5–5.1)
Sodium: 137 mmol/L (ref 135–145)

## 2020-09-25 LAB — HEPARIN LEVEL (UNFRACTIONATED): Heparin Unfractionated: 0.3 IU/mL (ref 0.30–0.70)

## 2020-09-25 MED ORDER — APIXABAN 5 MG PO TABS
5.0000 mg | ORAL_TABLET | Freq: Two times a day (BID) | ORAL | Status: DC
  Administered 2020-09-25 – 2020-10-01 (×13): 5 mg via ORAL
  Filled 2020-09-25 (×13): qty 1

## 2020-09-25 MED ORDER — CHLORHEXIDINE GLUCONATE CLOTH 2 % EX PADS
6.0000 | MEDICATED_PAD | Freq: Every day | CUTANEOUS | Status: DC
  Administered 2020-09-25 – 2020-09-28 (×3): 6 via TOPICAL

## 2020-09-25 NOTE — Progress Notes (Signed)
Pt. off floor in hemodialysis    09/24/20 2130  Assess: MEWS Score  BP (!) 101/49  Pulse Rate (!) 114  Assess: MEWS Score  MEWS Temp 0  MEWS Systolic 0  MEWS Pulse 2  MEWS RR 0  MEWS LOC 0  MEWS Score 2  MEWS Score Color Yellow

## 2020-09-25 NOTE — Progress Notes (Signed)
Pt. off the floor in hemodialysis    09/24/20 2205  Assess: MEWS Score  BP (!) 108/49  Pulse Rate (!) 114  Assess: MEWS Score  MEWS Temp 0  MEWS Systolic 0  MEWS Pulse 2  MEWS RR 0  MEWS LOC 0  MEWS Score 2  MEWS Score Color Yellow

## 2020-09-25 NOTE — Progress Notes (Signed)
Pt. off the floor in hemodialysis    09/24/20 2030  Assess: MEWS Score  BP (!) 102/53  Pulse Rate (!) 119  Assess: MEWS Score  MEWS Temp 0  MEWS Systolic 0  MEWS Pulse 2  MEWS RR 0  MEWS LOC 0  MEWS Score 2  MEWS Score Color Yellow

## 2020-09-25 NOTE — Progress Notes (Signed)
Pt. Is off the floor  in Hemodialysis     09/24/20 2150  Assess: MEWS Score  BP (!) 76/44  Pulse Rate (!) 115  Assess: MEWS Score  MEWS Temp 0  MEWS Systolic 2  MEWS Pulse 2  MEWS RR 0  MEWS LOC 0  MEWS Score 4  MEWS Score Color Red

## 2020-09-25 NOTE — Plan of Care (Signed)
  Problem: Health Behavior/Discharge Planning: Goal: Ability to manage health-related needs will improve Outcome: Progressing   Problem: Activity: Goal: Risk for activity intolerance will decrease Outcome: Progressing   

## 2020-09-25 NOTE — TOC Benefit Eligibility Note (Signed)
Transition of Care [facility]) Benefit Eligibility Note    Patient Details  Name: [name]. MRN: [id number] Date of Birth: Mar 25, 1951   Medication/Dose: Eliquis 2.5mg  and or 5mg  bid for 30 day supply  Covered?: Yes  Tier: 3 Drug  Prescription Coverage Preferred Pharmacy: [facility]  Spoke with Person/Company/Phone Number:: [employer] [place], PH# [phone]  Co-Pay: $47.00  Prior Approval: No  Deductible: Unmet       [name] Phone Number: 09/25/2020, 3:15 PM

## 2020-09-25 NOTE — TOC Benefit Eligibility Note (Signed)
Transition of Care [facility]) Benefit Eligibility Note    Patient Details  Name: [name]. MRN: [id number] Date of Birth: 1951-04-05   Medication/Dose: Eliquis 2.5mg  and or 5mg  bid for 30 day supply  Covered?: Yes  Tier: 3 Drug  Prescription Coverage Preferred Pharmacy: [facility]  Spoke with Person/Company/Phone Number:: [employer] [place], PH# [phone]  Co-Pay: $47.00  Prior Approval: No  Deductible: Unmet       [name] Phone Number: 09/25/2020, 3:16 PM

## 2020-09-25 NOTE — Progress Notes (Signed)
DBIV: Pt has tunneled HD cath (no pigtail). Requested RN notify phlebotomy he will need labs drawn peripherally.

## 2020-09-25 NOTE — Progress Notes (Signed)
ANTICOAGULATION CONSULT NOTE - Follow Up Consult  Pharmacy Consult for IV Heparin, apixaban  Indication: atrial fibrillation  Allergies  Allergen Reactions  . Codeine Swelling    Water retention  . Sulfur Other (See Comments)    Not sure of reaction a young child    Patient Measurements: Height: 5\' 9"  (175.3 cm) Weight: 125 kg (275 lb 9.2 oz) IBW/kg (Calculated) : 70.7 Heparin Dosing Weight: 93 kg  Vital Signs: Temp: 98.2 F (36.8 C) (11/11 0501) Temp Source: Oral (11/11 0501) BP: 98/62 (11/11 0501) Pulse Rate: 77 (11/11 0501)  Labs: Recent Labs    09/23/20 0340 09/23/20 0340 09/23/20 1127 09/24/20 0410 09/24/20 1258 09/25/20 0506 09/25/20 0642  HGB 11.0*   < > 10.5* 10.5*  --  9.8*  --   HCT 36.5*   < > 35.0* 33.9*  --  31.6*  --   PLT 252  --   --  243  --  186  --   HEPARINUNFRC 0.74*   < >  --  0.46 0.35  --  0.30  CREATININE 6.15*  --   --   --   --  5.24*  --    < > = values in this interval not displayed.    Estimated Creatinine Clearance: 16.9 mL/min (A) (by C-G formula based on SCr of 5.24 mg/dL (H)).  Assessment: 69yoM admitted after a fall and was found to have osteomyelitis of his feet. PMH s/f atrial fibrillation on apixaban. Patient was transitioned from apixaban to heparin for possible surgery. He declined surgery and was restarted on apixaban 09/10/20.  Pharmacy was consulted for transition back to IV heparin on 11/1 for AKI and HD catheter replacement. Last dose of apixaban given 11/1, s/p HD cath placement on 11/2. AKI, s/p HD and UF, currently on diuretics.  11/11: AM heparin level is therapeutic again at 0.30. Hgb slightly down after surgery to 9.8, no extensive bleeding is noted. Patient is s/p tunnel dialysis catheter and AV fistula placed on 11/10 - now plan is to transition heparin drip to home apixaban; dose is appropriate for SCr >1.5, age<80 and weight >60kg.  Goal of Therapy:  Monitor platelets by anticoagulation protocol: Yes    Plan:  -Discontinue heparin drip -Resume apixaban 5mg  BID  -Monitor CBC and signs of bleeding -Follow up with TOC benefits check   Mercy Riding, PharmD PGY1 Acute Care Pharmacy Resident Please refer to [facility] for unit-specific pharmacist

## 2020-09-25 NOTE — Progress Notes (Signed)
  Progress Note    09/25/2020 8:07 AM 1 Day Post-Op  Subjective:  He reports severe post-op pain which has now resolved. Denies hand pain or numbness   Vitals:   09/24/20 2359 09/25/20 0501  BP: 113/83 98/62  Pulse: 68 77  Resp:    Temp: 97.8 F (36.6 C) 98.2 F (36.8 C)  SpO2: 95% 96%    Physical Exam: Cardiac:  RRR Lungs:  CTAB Incisions:  Well approximated. No bleeding Extremities:  LUE: Good bruit and thrill in fistula. 5/5 hand grip strength. Hand is warm with intact sensation Chest; no bleeding at [facility] site  Post-op CXR: IMPRESSION: 1. Right IJ central venous line with tip close to the cavoatrial junction. No pneumothorax. 2. Mild vascular congestion.   CBC    Component Value Date/Time   WBC 6.3 09/25/2020 0506   RBC 3.70 (L) 09/25/2020 0506   HGB 9.8 (L) 09/25/2020 0506   HCT 31.6 (L) 09/25/2020 0506   PLT 186 09/25/2020 0506   MCV 85.4 09/25/2020 0506   MCH 26.5 09/25/2020 0506   MCHC 31.0 09/25/2020 0506   RDW 15.6 (H) 09/25/2020 0506   LYMPHSABS 1.6 09/23/2020 0340   MONOABS 1.0 09/23/2020 0340   EOSABS 0.0 09/23/2020 0340   BASOSABS 0.0 09/23/2020 0340    BMET    Component Value Date/Time   NA 137 09/25/2020 0506   NA 142 07/20/2018 1042   K 3.5 09/25/2020 0506   CL 101 09/25/2020 0506   CO2 23 09/25/2020 0506   GLUCOSE 100 (H) 09/25/2020 0506   BUN 39 (H) 09/25/2020 0506   BUN 25 07/20/2018 1042   CREATININE 5.24 (H) 09/25/2020 0506   CREATININE 1.39 (H) 10/22/2019 0941   CALCIUM 8.0 (L) 09/25/2020 0506   GFRNONAA 11 (L) 09/25/2020 0506   GFRNONAA 51 (L) 10/22/2019 0941   GFRAA 60 (L) 02/06/2020 0524   GFRAA 59 (L) 10/22/2019 0941     Intake/Output Summary (Last 24 hours) at 09/25/2020 0807 Last data filed at 09/25/2020 0600 Gross per 24 hour  Intake 1295 ml  Output 1353 ml  Net -58 ml    HOSPITAL MEDICATIONS Scheduled Meds: . (feeding supplement) PROSource Plus  30 mL Oral QID  . amoxicillin-clavulanate  1 tablet Oral  Daily  . Chlorhexidine Gluconate Cloth  6 each Topical Daily  . Chlorhexidine Gluconate Cloth  6 each Topical Q0600  . doxycycline  100 mg Oral Q12H  . HYDROmorphone      . insulin aspart  0-15 Units Subcutaneous TID WC  . insulin aspart  0-5 Units Subcutaneous QHS  . metoprolol tartrate  12.5 mg Oral BID  . midodrine  10 mg Oral TID WC  . multivitamin  1 tablet Oral QHS  . Ensure Max Protein  11 oz Oral BID  . traZODone  25 mg Oral QHS   Continuous Infusions: . heparin 1,500 Units/hr (09/25/20 0020)   PRN Meds:.acetaminophen **OR** acetaminophen, ALPRAZolam, HYDROcodone-acetaminophen, HYDROmorphone (DILAUDID) injection, labetalol, lidocaine, melatonin, ondansetron (ZOFRAN) IV, sodium chloride flush  Assessment: POD 1 right IJ TDC and left radiocephalic AVF. VSS. No indication presently of steal syndrome. AVF with good bruit  Plan: -Vascular surgery will sign off.  We will make arrangements for follow-up in the office in 4-6 weeks.  I have advised the patient to call our office should he develop incision drainage or breakdown, hand pain or numbness.  -DVT prophylaxis: heparin infusion   Risa Grill, PA-C [facility] [phone] 09/25/2020  8:07 AM

## 2020-09-25 NOTE — Progress Notes (Signed)
Pt. off the floor in Hemodialysis    09/24/20 2100  Assess: MEWS Score  BP (!) 104/52  Pulse Rate (!) 114  Assess: MEWS Score  MEWS Temp 0  MEWS Systolic 0  MEWS Pulse 2  MEWS RR 0  MEWS LOC 0  MEWS Score 2  MEWS Score Color Yellow

## 2020-09-25 NOTE — Progress Notes (Signed)
Pt daughter Karie Schwalbe called, she was concerned that pt's ex wife is getting calls regarding his plan of care. The daughter states she is the primary person to be called regarding his care and plans. She doesn't want the ex wife to be called. Daughter number is [phone].  Lilianah Buffin, RN 09/25/2020

## 2020-09-25 NOTE — Anesthesia Postprocedure Evaluation (Signed)
Anesthesia Post Note  Patient: Shailen Thielen.  Procedure(s) Performed: CREATION OF LEFT ARM RADIOCEPHALIC ARTERIOVENOUS (AV) FISTULA (Left Arm Lower) INSERTION OF RIGHT INTERNAL JUGULAR TUNNELED DIALYSIS CATHETER (Right Neck)     Patient location during evaluation: PACU Anesthesia Type: General Level of consciousness: awake and alert Pain management: pain level controlled Vital Signs Assessment: post-procedure vital signs reviewed and stable Respiratory status: spontaneous breathing, nonlabored ventilation, respiratory function stable and patient connected to nasal cannula oxygen Cardiovascular status: blood pressure returned to baseline and stable Postop Assessment: no apparent nausea or vomiting Anesthetic complications: no   No complications documented.  Last Vitals:  Vitals:   09/25/20 0919 09/25/20 1757  BP: 132/89 114/72  Pulse: 95 97  Resp: 18 19  Temp: 36.7 C 36.9 C  SpO2: 95% 99%    Last Pain:  Vitals:   09/25/20 0919  TempSrc: Oral  PainSc:                  Catalina Gravel

## 2020-09-25 NOTE — Progress Notes (Signed)
Physical Therapy Treatment Patient Details Name: [name]. MRN: [id number] DOB: 08/02/1951 Today's Date: 09/25/2020    History of Present Illness 69 y.o. male with PMH of DM2, CHF, afib, HTN, R THA in 2016 presents to ED after being found down in the bathroom this AM during welfare check; xray found osteomyelitis of 5th toe on R foot.    PT Comments    Pt was up in chair on arrival to room and agreeable to participate with therapy. He increased ambulation distance today. Pt advised he would need socks to ambulated and required max encouragement to agree to don socks for safety with ambulation. He seems confused at times as to why he is in the hospital and frequently states he just want to go home. Will continue to follow acutely for mobility progression.    Follow Up Recommendations  SNF     Equipment Recommendations  None recommended by PT    Recommendations for Other Services       Precautions / Restrictions Precautions Precautions: Fall Required Braces or Orthoses: Other Brace Other Brace: R foot PRAFO (in orders, but too small), Unna boots Restrictions Weight Bearing Restrictions: No    Mobility  Bed Mobility               General bed mobility comments: up in chair on arrival  Transfers Overall transfer level: Needs assistance Equipment used: Rolling walker (2 wheeled) Transfers: Sit to/from Stand Sit to Stand: Supervision         General transfer comment: Supervision for sit to stand transfer with RW, no LOB noted (5x sit<>stand)  Ambulation/Gait Ambulation/Gait assistance: Supervision Gait Distance (Feet): 60 Feet Assistive device: Rolling walker (2 wheeled) Gait Pattern/deviations: Step-through pattern;Decreased stride length Gait velocity: decreased   General Gait Details: Pt ambulated short distance in hallway using RW. VC for RW proximity   Stairs             Wheelchair Mobility    Modified Rankin (Stroke Patients Only)        Balance Overall balance assessment: Needs assistance Sitting-balance support: No upper extremity supported;Feet supported Sitting balance-Leahy Scale: Good     Standing balance support: Bilateral upper extremity supported;During functional activity Standing balance-Leahy Scale: Poor Standing balance comment: reliant on UE support                            Cognition Arousal/Alertness: Awake/alert Behavior During Therapy: WFL for tasks assessed/performed   Area of Impairment: Memory;Safety/judgement                         Safety/Judgement: Decreased awareness of safety;Decreased awareness of deficits     General Comments: Pushes against safety precautions like wearing socks. Does not seem to understand why he is in the hospital.      Exercises      General Comments General comments (skin integrity, edema, etc.): HD port bleeding at end of session. RN notified.      Pertinent Vitals/Pain Pain Assessment: No/denies pain    Home Living                      Prior Function            PT Goals (current goals can now be found in the care plan section) Acute Rehab PT Goals Patient Stated Goal: go home PT Goal Formulation: With patient Time For Goal Achievement:  09/25/20 Potential to Achieve Goals: Good Progress towards PT goals: Progressing toward goals    Frequency    Min 3X/week      PT Plan Current plan remains appropriate    Co-evaluation              AM-PAC PT "6 Clicks" Mobility   Outcome Measure  Help needed turning from your back to your side while in a flat bed without using bedrails?: A Little Help needed moving from lying on your back to sitting on the side of a flat bed without using bedrails?: A Little Help needed moving to and from a bed to a chair (including a wheelchair)?: A Little Help needed standing up from a chair using your arms (e.g., wheelchair or bedside chair)?: A Little Help needed to walk  in hospital room?: A Little Help needed climbing 3-5 steps with a railing? : A Little 6 Click Score: 18    End of Session Equipment Utilized During Treatment: Gait belt Activity Tolerance: Patient tolerated treatment well Patient left: in chair;with call bell/phone within reach Nurse Communication: Mobility status PT Visit Diagnosis: Unsteadiness on feet (R26.81);Other abnormalities of gait and mobility (R26.89);Muscle weakness (generalized) (M62.81)     Time: 1700-1749 PT Time Calculation (min) (ACUTE ONLY): 37 min  Charges:  $Gait Training: 8-22 mins $Therapeutic Activity: 8-22 mins                     Benjiman Core, [place] Pager [phone] Acute Rehab   Allena Katz 09/25/2020, 3:59 PM

## 2020-09-25 NOTE — Progress Notes (Signed)
Occupational Therapy Treatment Patient Details Name: [name]. MRN: [id number] DOB: [birth year] Today's Date: 09/25/2020    History of present illness 69 y.o. male with PMH of DM2, CHF, afib, HTN, R THA in 2016 presents to ED after being found down in the bathroom this AM during welfare check; xray found osteomyelitis of 5th toe on R foot.   OT comments  Pt. Was cooperative with OT. Pt. Was S with transfer to commode. Pt. Was S with AMB in room with walker. Pt. Was S with grooming in standing. Pt. Did not have any LOB in standing or amb.  Acute OT to follow.   Follow Up Recommendations  Home health OT;SNF    Equipment Recommendations  None recommended by OT    Recommendations for Other Services      Precautions / Restrictions Precautions Precautions: Fall Required Braces or Orthoses: Other Brace Other Brace: R foot PRAFO (in orders, but too small), Unna boots Restrictions Weight Bearing Restrictions: No       Mobility Bed Mobility                  Transfers Overall transfer level: Needs assistance Equipment used: Rolling walker (2 wheeled) Transfers: Sit to/from Stand Sit to Stand: Supervision              Balance                                           ADL either performed or assessed with clinical judgement   ADL Overall ADL's : Needs assistance/impaired     Grooming: Wash/dry hands;Supervision/safety;Standing                   Toilet Transfer: Supervision/safety;Ambulation;Regular Toilet;RW   Toileting- Clothing Manipulation and Hygiene: Supervision/safety;Sit to/from stand       Functional mobility during ADLs: Supervision/safety;Rolling walker General ADL Comments: Pt. uses RW to AMB.      Vision   Vision Assessment?: No apparent visual deficits   Perception     Praxis      Cognition Arousal/Alertness: Awake/alert Behavior During Therapy: WFL for tasks assessed/performed                                             Exercises     Shoulder Instructions       General Comments      Pertinent Vitals/ Pain       Pain Assessment: No/denies pain  Home Living                                          Prior Functioning/Environment              Frequency  Min 2X/week        Progress Toward Goals  OT Goals(current goals can now be found in the care plan section)  Progress towards OT goals: Progressing toward goals  Acute Rehab OT Goals Patient Stated Goal: go home OT Goal Formulation: With patient Time For Goal Achievement: 10/06/20 Potential to Achieve Goals: Good ADL Goals Pt Will Perform Grooming: with modified independence;standing Pt Will Perform Lower Body Bathing: with modified independence;sit to/from stand;sitting/lateral  leans Pt Will Perform Lower Body Dressing: with modified independence;sitting/lateral leans;sit to/from stand Pt Will Transfer to Toilet: with modified independence;ambulating;regular height toilet Pt Will Perform Toileting - Clothing Manipulation and hygiene: with modified independence;sitting/lateral leans;sit to/from stand Additional ADL Goal #1: Pt to verbalize at least 3 strategies to maximize skin integrity and wound healing  Plan      Co-evaluation                 AM-PAC OT "6 Clicks" Daily Activity     Outcome Measure   Help from another person eating meals?: None Help from another person taking care of personal grooming?: A Little Help from another person toileting, which includes using toliet, bedpan, or urinal?: A Little Help from another person bathing (including washing, rinsing, drying)?: A Little Help from another person to put on and taking off regular upper body clothing?: A Little Help from another person to put on and taking off regular lower body clothing?: A Little 6 Click Score: 19    End of Session Equipment Utilized During Treatment: Rolling walker  OT Visit  Diagnosis: Other abnormalities of gait and mobility (R26.89);Muscle weakness (generalized) (M62.81)   Activity Tolerance Patient tolerated treatment well   Patient Left in chair;with call bell/phone within reach;with chair alarm set   Nurse Communication Mobility status        Time: 9326-7124 OT Time Calculation (min): 29 min  Charges: OT General Charges $OT Visit: 1 Visit OT Treatments $Self Care/Home Management : 23-37 mins  Reece Packer OT/L    Ozzie Remmers 09/25/2020, 11:19 AM

## 2020-09-25 NOTE — Progress Notes (Signed)
Patient back in his room from HD

## 2020-09-25 NOTE — Progress Notes (Signed)
[facility] NEPHROLOGY PROGRESS NOTE  Assessment/ Plan: Pt is a 69 y.o. yo male with history of CHF, DM, A. fib, hypertension found down at home during welfare check currently followed by APS.  In ED he had A. fib with RVR, diabetic foot infection and acute kidney injury.  #Acute kidney injury on CKD stage IIIb: Baseline creatinine level 1.3-1.9.  AKI multifactorial etiology including cardiorenal syndrome, ATN.  Started dialysis on 11/3 and has been tolerating well.  Tried high-dose IV diuretics without response.   No sign of renal recovery.  We had a meeting in the presence of palliative care on 11/9 with the patient and his daughter.  They agreed to dialysis and permanent access. RIJ TDC and left radiocephalic AV fistula created by Dr. Donnetta Hutching on 11/10. Last HD yesterday with 1.3 L UF.  Plan for next HD tomorrow. He will also need outpatient HD arranged for AKI.  Discussed with Education officer, museum.  #CHF with systolic and diastolic dysfunction: Volume overload causing cardiorenal syndrome.  Did not respond with IV diuretics therefore now volume management with dialysis. Discontinue diuretics.  #Hypertension: On metoprolol for A. fib.  Requiring midodrine for intradialytic hypotension.  #Sepsis with osteomyelitis of fifth toe on the right foot: On antibiotics per primary team.  #Anemia due to sepsis, AKI: Hemoglobin at goal, mild drop today.  Check iron level.  #Metabolic acidosis: Managed with dialysis now.  #Bleeding from tunnel catheter site: The temporary catheter is out.  No bleeding.  #A. fib with RVR: No further bleeding, okay to resume Eliquis.  Discussed with primary team.  Subjective: Seen and examined at bedside.  No new event.  Tolerated HD well and had vascular procedure yesterday.  Denies nausea vomiting chest pain shortness of breath. Objective Vital signs in last 24 hours: Vitals:   09/24/20 2300 09/24/20 2310 09/24/20 2359 09/25/20 0501  BP: (!) 90/58 (!)  112/58 113/83 98/62  Pulse: 100 93 68 77  Resp:  16    Temp:  97.6 F (36.4 C) 97.8 F (36.6 C) 98.2 F (36.8 C)  TempSrc:  Oral Oral Oral  SpO2:  94% 95% 96%  Weight:  125 kg    Height:       Weight change:   Intake/Output Summary (Last 24 hours) at 09/25/2020 1005 Last data filed at 09/25/2020 5993 Gross per 24 hour  Intake 1755 ml  Output 1353 ml  Net 402 ml       Labs: Basic Metabolic Panel: Recent Labs  Lab 09/19/20 0407 09/19/20 0407 09/20/20 0503 09/21/20 0321 09/22/20 0301 09/23/20 0340 09/25/20 0506  NA 134*   < > 134*   < > 137 137 137  K 3.2*   < > 3.5   < > 3.9 3.6 3.5  CL 98   < > 99   < > 104 102 101  CO2 23   < > 24   < > 19* 22 23  GLUCOSE 126*   < > 130*   < > 116* 110* 100*  BUN 52*   < > 39*   < > 63* 52* 39*  CREATININE 5.03*   < > 4.79*   < > 6.93* 6.15* 5.24*  CALCIUM 7.9*   < > 8.3*   < > 8.7* 8.3* 8.0*  PHOS 6.7*  --  5.7*  --   --   --   --    < > = values in this interval not displayed.   Liver Function Tests: No  results for input(s): AST, ALT, ALKPHOS, BILITOT, PROT, ALBUMIN in the last 168 hours. No results for input(s): LIPASE, AMYLASE in the last 168 hours. No results for input(s): AMMONIA in the last 168 hours. CBC: Recent Labs  Lab 09/21/20 0321 09/21/20 0321 09/22/20 0301 09/22/20 0301 09/23/20 0340 09/23/20 0340 09/23/20 1127 09/24/20 0410 09/25/20 0506  WBC 8.2   < > 8.1   < > 6.5  --   --  6.1 6.3  NEUTROABS 6.2  --  5.5  --  3.7  --   --   --   --   HGB 10.6*   < > 11.0*   < > 11.0*   < > 10.5* 10.5* 9.8*  HCT 34.0*   < > 36.4*   < > 36.5*   < > 35.0* 33.9* 31.6*  MCV 85.6  --  88.1  --  88.6  --   --  89.0 85.4  PLT 299   < > 291   < > 252  --   --  243 186   < > = values in this interval not displayed.   Cardiac Enzymes: No results for input(s): CKTOTAL, CKMB, CKMBINDEX, TROPONINI in the last 168 hours. CBG: Recent Labs  Lab 09/24/20 1340 09/24/20 1637 09/24/20 1738 09/25/20 0016 09/25/20 0631   GLUCAP 105* 117* 120* 103* 94    Iron Studies: No results for input(s): IRON, TIBC, TRANSFERRIN, FERRITIN in the last 72 hours. Studies/Results: DG Chest 1 View  Result Date: 09/24/2020 CLINICAL DATA:  69 year old male with central line placement. EXAM: CHEST  1 VIEW COMPARISON:  Chest radiograph dated 09/12/2020 FINDINGS: Right IJ central venous line with tip close to the cavoatrial junction. No pneumothorax. There is no focal consolidation, or pleural effusion. The cardiac silhouette is within limits. Atherosclerotic calcification of the aorta. There is mild vascular congestion. No acute osseous pathology. IMPRESSION: 1. Right IJ central venous line with tip close to the cavoatrial junction. No pneumothorax. 2. Mild vascular congestion. Electronically Signed   By: Anner Crete M.D.   On: 09/24/2020 18:01   DG Fluoro Guide CV Line-No Report  Result Date: 09/24/2020 Fluoroscopy was utilized by the requesting physician.  No radiographic interpretation.    Medications: Infusions:   Scheduled Medications: . (feeding supplement) PROSource Plus  30 mL Oral QID  . amoxicillin-clavulanate  1 tablet Oral Daily  . apixaban  5 mg Oral BID  . Chlorhexidine Gluconate Cloth  6 each Topical Daily  . Chlorhexidine Gluconate Cloth  6 each Topical Q0600  . doxycycline  100 mg Oral Q12H  . insulin aspart  0-15 Units Subcutaneous TID WC  . insulin aspart  0-5 Units Subcutaneous QHS  . metoprolol tartrate  12.5 mg Oral BID  . midodrine  10 mg Oral TID WC  . multivitamin  1 tablet Oral QHS  . Ensure Max Protein  11 oz Oral BID  . traZODone  25 mg Oral QHS    have reviewed scheduled and prn medications.  Physical Exam: General:NAD, comfortable Heart:RRR, s1s2 nl Lungs: Basal decreased breath sound Abdomen:soft, Non-tender, non-distended Extremities: Edema present and chronic stasis changes. Dialysis Access: Right IJ TDC, left upper extremity AV fistula site has no bleeding.  Minimal  swelling Neurology: Alert awake and oriented x3.  Vincent Ehrler Prasad Monte Bronder 09/25/2020,10:05 AM  LOS: 17 days  Pager: [phone]

## 2020-09-25 NOTE — Progress Notes (Signed)
PROGRESS NOTE    [name].  GLO:[id number] DOB: 1951/03/26 DOA: 09/08/2020 PCP: Aura Dials, MD   Chief Complaint  Patient presents with  . Wound Infection    Brief Narrative:  69 year old male with J8AC, chronic systolic and diastolic CHF, PAF supposed to be on Eliquis, HTN, morbid obesity brought to ED 09/08/2020 after he was found down in his house.  Reportedly patient was followed.  Protective services, has very unsanitary living condition at home and was found down in the bathroom during welfare check. Per report: Patient reports falling while standing up from bathroom without LOC, resulting in fall onto R chest and resultant R lower chest pain. Patient was found down and brought into ED.  Of note, patient reports stopping all medications several months ago with exception of torsemide because "I didn't think I needed them anymore." Pt has only been taking torsemide PRN swelling.  He was found to have necrotic foot infections and underwent xray of right foot, which revealed subcortical lucency involving 5th digit concerning for OM.  He was started on vancomycin and cefepime and orthopedic surgery was consulted. ABIs were also ordered. Test was inconclusive due to severe lower extremity edema.   Initially patient was also against surgical treatment options despite severity of his wounds, their obvious chronicity, and inability to explain consequences for avoiding surgery. We requested psychiatry to evaluate patient for decision making ability in this setting. After repeat explanation of his diagnosis and possible treatment options he was able to voice adequate understanding to psychiatry and was considered to have decision making capacity regarding his current infection.   He will need to continue with serial compression wrappings at discharge in efforts to promote better chance of wound healing and proper assessment of ABIs to help decide best treatment course.   Given  his significant deconditioning and after discussion with his daughter, there is concern for his ability to safely return home due to poor living situation; Physical therapy was consulted. After PT evaluation he was recommended for SNF at discharge. He was accepting of this and placement was pursued.  Overall, he had good clinical response to IV antibiotics. He was deescalated to Augmentin and doxycycline to finish out a course. He will continue with serial compression wrappings at discharge to rehab as well as following up with Ortho for further management.  During hospitalization he was started on IV Lasix for diuresis given his severe lower extremity edema. His creatinine continued to slowly uptrend. Urine output measuring was inaccurate and difficult to gauge response. Nephrology was consulted on 09/14/2020 given his uptrending creatinine in setting of multiple antibiotics initially and some transient hypotension.  Renal ultrasound showed no hydronephrosis.  He underwent temporary HD catheter placement by IR on 09/16/2020 and nephrology recommended transfer to [facility] for possible short-term hemodialysis. Noncompliant in general and very self-neglected with denial component. Admitted with horrendous feet/legs. Has osteo in R foot 5th digit. Very well possibly in left foot too chronically.  Too edematous for good ABI's per ortho so getting serial compression wrapping with plans for outpt follow up with ortho (on augmentin/doxy now for OM tx).  Patient was transferred to [facility] 11/3 and dialysis was started. Patient underwent dialysis multiple sessions, no renal recovery.  Some urine output and hematuria. Seen by palliative care and ongoing discussion involving family members.  Patient wanted to proceed with dialysis on outpatient basis. 09/24/20: Underwent right IJ tunneled hemodialysis catheter placement and left radiocephalic AV fistula creation by  Dr.  Donnetta Hutching  Subjective:  Patient went OR for fistula and TDC placement and had dialysis afterwards yesterday. Hb 9.8 g.  Overall afebrile.  BP has been soft 90s to 110s ongoing. " Doc I just do whatever they tell me to do" " Is it mandatory to go to rehab?"  Assessment & Plan:  AKI on CKD stage III with baseline creatinine 1.3-1.9: Seen by Nephrology -etiology multifactorial including cardiorenal syndrome, ATN.  Now on dialysis since 11/3 and tolerating well, at times episode of hypotension and is started on midodrine.  Tried to high-dose IV Lasix without response, at this time no renal recovery, family meeting convened 11/9 plan is to proceed with dialysis. S/P Rtt IJ tunneled HD  catheter placement and left radiocephalic AV fistula creation by Dr. Donnetta Hutching 11/10.  Continue dialysis while here and noted arrangements have been made for outpatient dialysis.   Acute on chronic combined systolic and diastolic CHF: LVEF 32-99% in LVEF, previously back in March EF was 40-45%: TTE shows lvef 30-35%, severe concentric LVH, global hypokinesia.  Now dialysis dependent and volume will be managed in dialysis   Hypotension: BP soft, continue midodrine.  Continue metoprolol w/ holding parameters for a fib-May need to discontinue again since BP soft  Extensive bilateral lower extremity edema wound on right foot,Osteomyelitis of fifth toe of right foot per x-ray, ABI indeterminate due to lower extremity edema.  Seen by Dr. Sharol Given offered amputation but patient refused.  Family agreed with patient decision not to pursue amputation.  Plan is to continue aggressive wound care, outpatient doxycycline long-term, follow-up with Dr. Sharol Given as outpatient, SNF placement.  Continue wound care.   Chronic lymphedema: Continue Una boot, close wound care follow-up.   Falls at home/neuropathy: Continue PT OT, will need skilled nursing facility placement.  Hypokalemia resolved  Severe sepsis MEQ:ASTMHD is resolved.  Acute  encephalopathy, uremic/septic on presentation per chArt. At this time mental status is stable.    PAF with QQI:[id number] with meds.continue metoprolol blood pressure tolerates otherwise can discontinue.  Got the procedure done yesterday if okay with nephrology will switch to Eliquis- pharmacy to adjust dose 2/2 AKI.  Hematuria after placing foley catheter suspect catheter related trauma: off Foley 11/9. Patient denies any more hematuria.  Type 2 diabetes mellitus: A1c uncontrolled 9.6 10/25.  Blood sugar is controlled on sliding scale.  Blood sugar stable on sliding scale insulin Recent Labs  Lab 09/24/20 1340 09/24/20 1637 09/24/20 1738 09/25/20 0016 09/25/20 0631  GLUCAP 105* 117* 120* 103* 94   Morbid obesity with BMI 43>41, he will benefit with weight loss healthy lifestyle outpatient sleep apnea evaluation but I am not sure how much compliant of feasible it is for him.  GOC: Family meeting completed  09/23/20 morning plan is to proceed with dialysis outpatient skilled nursing facility rehab.  Patient is at risk of decompensation will need close follow-up monitoring continue dialysis as per nephrology.  He has refused amputation and will need to follow-up with Dr. Sharol Given and continue on long-term antibiotics.    Nutrition: Diet Order            Diet renal with fluid restriction Fluid restriction: 1200 mL Fluid; Room service appropriate? Yes; Fluid consistency: Thin  Diet effective now                 Nutrition Problem: Increased nutrient needs Etiology: wound healing, acute illness Signs/Symptoms: estimated needs Interventions: MVI, Other (Comment) (Prosource Plus)  Body mass index is 40.7 kg/m.  Pressure Ulcer: Pressure Injury 09/08/20 Heel Right Stage 3 -  Full thickness tissue loss. Subcutaneous fat may be visible but bone, tendon or muscle are NOT exposed. 25% red, 75% slouogh, strong odor, mod brown (Active)  09/08/20   Location: Heel  Location Orientation: Right   Staging: Stage 3 -  Full thickness tissue loss. Subcutaneous fat may be visible but bone, tendon or muscle are NOT exposed.  Wound Description (Comments): 25% red, 75% slouogh, strong odor, mod brown  Present on Admission: Yes     Pressure Injury 09/17/20 Buttocks Right;Left Deep Tissue Pressure Injury - Purple or maroon localized area of discolored intact skin or blood-filled blister due to damage of underlying soft tissue from pressure and/or shear. (Active)  09/17/20 1805  Location: Buttocks  Location Orientation: Right;Left  Staging: Deep Tissue Pressure Injury - Purple or maroon localized area of discolored intact skin or blood-filled blister due to damage of underlying soft tissue from pressure and/or shear.  Wound Description (Comments):   Present on Admission:    DVT prophylaxis: heparin gtt Code Status:  Code Status: Full Code  Family Communication: plan of care discussed with patient.  Daughters have been updated multiple times.  Family meeting completed 09/23/2020 at bedside along with nephrology and palliative care be RN.  Status ZH:YQMVHQION Remains inpatient appropriate because:IV treatments appropriate due to intensity of illness or inability to take PO and Inpatient level of care appropriate due to severity of illness  Dispo:The patient is from: Home            Anticipated d/c is to: SNF            Anticipated d/c date is: Once SNF and Outpatient dialysis has been arranged he can be discharged to SNF             Patient currently is not medically stable to d/c. Consultants:see note  Procedures:see note  Culture/Microbiology    Component Value Date/Time   SDES  09/08/2020 2103    BLOOD LEFT HAND Performed at [facility], [place] [address], [place], [place] [address]    SPECREQUEST  09/08/2020 2103    BOTTLES DRAWN AEROBIC AND ANAEROBIC Blood Culture adequate volume Performed at [facility] [address] [place], [place]  [address]    CULT  09/08/2020 2103    NO GROWTH 5 DAYS Performed at [facility] [address], [place], [place] [address]    REPTSTATUS 09/13/2020 FINAL 09/08/2020 2103    Other culture-see note  Medications: Scheduled Meds: . (feeding supplement) PROSource Plus  30 mL Oral QID  . amoxicillin-clavulanate  1 tablet Oral Daily  . Chlorhexidine Gluconate Cloth  6 each Topical Daily  . Chlorhexidine Gluconate Cloth  6 each Topical Q0600  . doxycycline  100 mg Oral Q12H  . HYDROmorphone      . insulin aspart  0-15 Units Subcutaneous TID WC  . insulin aspart  0-5 Units Subcutaneous QHS  . metoprolol tartrate  12.5 mg Oral BID  . midodrine  10 mg Oral TID WC  . multivitamin  1 tablet Oral QHS  . Ensure Max Protein  11 oz Oral BID  . traZODone  25 mg Oral QHS   Continuous Infusions: . heparin 1,500 Units/hr (09/25/20 0020)    Antimicrobials: Anti-infectives (From admission, onward)   Start     Dose/Rate Route Frequency Ordered Stop   09/17/20 1000  amoxicillin-clavulanate (AUGMENTIN) 500-125 MG per tablet 500 mg  1 tablet Oral Daily 09/17/20 0839     09/14/20 2200  amoxicillin-clavulanate (AUGMENTIN) 500-125 MG per tablet 500 mg  Status:  Discontinued        1 tablet Oral 2 times daily 09/14/20 1524 09/17/20 0839   09/12/20 1000  amoxicillin-clavulanate (AUGMENTIN) 875-125 MG per tablet 1 tablet  Status:  Discontinued        1 tablet Oral Every 12 hours 09/12/20 0823 09/14/20 1524   09/12/20 1000  doxycycline (VIBRA-TABS) tablet 100 mg        100 mg Oral Every 12 hours 09/12/20 0823     09/10/20 2000  DAPTOmycin (CUBICIN) 740 mg in sodium chloride 0.9 % IVPB  Status:  Discontinued        740 mg 229.6 mL/hr over 30 Minutes Intravenous Daily 09/10/20 1230 09/12/20 0822   09/10/20 1200  metroNIDAZOLE (FLAGYL) IVPB 500 mg  Status:  Discontinued        500 mg 100 mL/hr over 60 Minutes Intravenous Every 8 hours 09/10/20 1050 09/12/20 0822   09/10/20 1132  vancomycin  variable dose per unstable renal function (pharmacist dosing)  Status:  Discontinued         Does not apply See admin instructions 09/10/20 1132 09/10/20 1230   09/09/20 1700  vancomycin (VANCOREADY) IVPB 1500 mg/300 mL  Status:  Discontinued        1,500 mg 150 mL/hr over 120 Minutes Intravenous Every 24 hours 09/08/20 1956 09/10/20 1132   09/09/20 1000  ceFEPIme (MAXIPIME) 2 g in sodium chloride 0.9 % 100 mL IVPB  Status:  Discontinued        2 g 200 mL/hr over 30 Minutes Intravenous Every 12 hours 09/08/20 1947 09/12/20 0822   09/08/20 2200  ceFEPIme (MAXIPIME) 2 g in sodium chloride 0.9 % 100 mL IVPB        2 g 200 mL/hr over 30 Minutes Intravenous  Once 09/08/20 1754 09/08/20 2206   09/08/20 1800  vancomycin (VANCOCIN) IVPB 1000 mg/200 mL premix  Status:  Discontinued        1,000 mg 200 mL/hr over 60 Minutes Intravenous  Once 09/08/20 1754 09/08/20 1756   09/08/20 1630  vancomycin (VANCOREADY) IVPB 2000 mg/400 mL        2,000 mg 200 mL/hr over 120 Minutes Intravenous  Once 09/08/20 1551 09/08/20 1842   09/08/20 1600  piperacillin-tazobactam (ZOSYN) IVPB 3.375 g        3.375 g 100 mL/hr over 30 Minutes Intravenous  Once 09/08/20 1549 09/08/20 1645     Objective: Vitals: Today's Vitals   09/24/20 2310 09/24/20 2359 09/25/20 0100 09/25/20 0501  BP: (!) 112/58 113/83  98/62  Pulse: 93 68  77  Resp: 16     Temp: 97.6 F (36.4 C) 97.8 F (36.6 C)  98.2 F (36.8 C)  TempSrc: Oral Oral  Oral  SpO2: 94% 95%  96%  Weight: 125 kg     Height:      PainSc: Asleep  0-No pain     Intake/Output Summary (Last 24 hours) at 09/25/2020 0744 Last data filed at 09/25/2020 0600 Gross per 24 hour  Intake 1324.95 ml  Output 1353 ml  Net -28.05 ml   Filed Weights   09/22/20 0750 09/24/20 1933 09/24/20 2310  Weight: 126.9 kg 125.8 kg 125 kg   Weight change:   Intake/Output from previous day: 11/10 0701 - 11/11 0700 In: 1325 [P.O.:360; I.V.:465; IV Piggyback:500] Out: 1353   Intake/Output this shift: No intake/output  data recorded.  Examination: General exam: AAOx3, obese, NAD, weak appearing. HEENT:Oral mucosa moist, Ear/Nose WNL grossly, dentition normal. Respiratory system: bilaterally clear,no wheezing or crackles,no use of accessory muscle Cardiovascular system: S1 & S2 +, No JVD,. Gastrointestinal system: Abdomen soft, NT,ND, BS+ Nervous System:Alert, awake, moving extremities and grossly nonfocal Extremities: Bilateral edematous leg in UNA boot dressing- see pic below from 11/10 priro to current dressing Skin: No rashes,no icterus. MSK: Normal muscle bulk,tone, power          Data Reviewed: I have personally reviewed following labs and imaging studies CBC: Recent Labs  Lab 09/19/20 0407 09/19/20 0407 09/20/20 0503 09/20/20 0503 09/21/20 0321 09/21/20 0321 09/22/20 0301 09/23/20 0340 09/23/20 1127 09/24/20 0410 09/25/20 0506  WBC 9.8   < > 10.5   < > 8.2  --  8.1 6.5  --  6.1 6.3  NEUTROABS 6.6  --  7.1  --  6.2  --  5.5 3.7  --   --   --   HGB 10.5*   < > 11.3*   < > 10.6*   < > 11.0* 11.0* 10.5* 10.5* 9.8*  HCT 33.2*   < > 36.3*   < > 34.0*   < > 36.4* 36.5* 35.0* 33.9* 31.6*  MCV 86.2   < > 86.2   < > 85.6  --  88.1 88.6  --  89.0 85.4  PLT 304   < > 348   < > 299  --  291 252  --  243 186   < > = values in this interval not displayed.   Basic Metabolic Panel: Recent Labs  Lab 09/19/20 0407 09/19/20 0407 09/20/20 0503 09/21/20 0321 09/22/20 0301 09/23/20 0340 09/25/20 0506  NA 134*   < > 134* 135 137 137 137  K 3.2*   < > 3.5 3.1* 3.9 3.6 3.5  CL 98   < > 99 101 104 102 101  CO2 23   < > 24 21* 19* 22 23  GLUCOSE 126*   < > 130* 96 116* 110* 100*  BUN 52*   < > 39* 49* 63* 52* 39*  CREATININE 5.03*   < > 4.79* 6.05* 6.93* 6.15* 5.24*  CALCIUM 7.9*   < > 8.3* 8.5* 8.7* 8.3* 8.0*  MG 1.8  --  1.8  --   --   --   --   PHOS 6.7*  --  5.7*  --   --   --   --    < > = values in this interval not displayed.    GFR: Estimated Creatinine Clearance: 16.9 mL/min (A) (by C-G formula based on SCr of 5.24 mg/dL (H)). Liver Function Tests: No results for input(s): AST, ALT, ALKPHOS, BILITOT, PROT, ALBUMIN in the last 168 hours. No results for input(s): LIPASE, AMYLASE in the last 168 hours. No results for input(s): AMMONIA in the last 168 hours. Coagulation Profile: No results for input(s): INR, PROTIME in the last 168 hours. Cardiac Enzymes: No results for input(s): CKTOTAL, CKMB, CKMBINDEX, TROPONINI in the last 168 hours. BNP (last 3 results) No results for input(s): PROBNP in the last 8760 hours. HbA1C: No results for input(s): HGBA1C in the last 72 hours. CBG: Recent Labs  Lab 09/24/20 1340 09/24/20 1637 09/24/20 1738 09/25/20 0016 09/25/20 0631  GLUCAP 105* 117* 120* 103* 94   Lipid Profile: No results for input(s): CHOL, HDL, LDLCALC, TRIG, CHOLHDL, LDLDIRECT in the last 72 hours. Thyroid Function Tests: No results  for input(s): TSH, T4TOTAL, FREET4, T3FREE, THYROIDAB in the last 72 hours. Anemia Panel: No results for input(s): VITAMINB12, FOLATE, FERRITIN, TIBC, IRON, RETICCTPCT in the last 72 hours. Sepsis Labs: No results for input(s): PROCALCITON, LATICACIDVEN in the last 168 hours.  No results found for this or any previous visit (from the past 240 hour(s)).   Radiology Studies: DG Chest 1 View  Result Date: 09/24/2020 CLINICAL DATA:  69 year old male with central line placement. EXAM: CHEST  1 VIEW COMPARISON:  Chest radiograph dated 09/12/2020 FINDINGS: Right IJ central venous line with tip close to the cavoatrial junction. No pneumothorax. There is no focal consolidation, or pleural effusion. The cardiac silhouette is within limits. Atherosclerotic calcification of the aorta. There is mild vascular congestion. No acute osseous pathology. IMPRESSION: 1. Right IJ central venous line with tip close to the cavoatrial junction. No pneumothorax. 2. Mild vascular congestion.  Electronically Signed   By: Anner Crete M.D.   On: 09/24/2020 18:01   DG Fluoro Guide CV Line-No Report  Result Date: 09/24/2020 Fluoroscopy was utilized by the requesting physician.  No radiographic interpretation.     LOS: 17 days   Antonieta Pert, MD [facility]  09/25/2020, 7:44 AM

## 2020-09-26 DIAGNOSIS — M869 Osteomyelitis, unspecified: Secondary | ICD-10-CM | POA: Diagnosis not present

## 2020-09-26 LAB — RENAL FUNCTION PANEL
Albumin: 3.1 g/dL — ABNORMAL LOW (ref 3.5–5.0)
Anion gap: 13 (ref 5–15)
BUN: 60 mg/dL — ABNORMAL HIGH (ref 8–23)
CO2: 22 mmol/L (ref 22–32)
Calcium: 8.3 mg/dL — ABNORMAL LOW (ref 8.9–10.3)
Chloride: 101 mmol/L (ref 98–111)
Creatinine, Ser: 6.79 mg/dL — ABNORMAL HIGH (ref 0.61–1.24)
GFR, Estimated: 8 mL/min — ABNORMAL LOW (ref >60)
Glucose, Bld: 140 mg/dL — ABNORMAL HIGH (ref 70–99)
Phosphorus: 6.9 mg/dL — ABNORMAL HIGH (ref 2.5–4.6)
Potassium: 3.9 mmol/L (ref 3.5–5.1)
Sodium: 136 mmol/L (ref 135–145)

## 2020-09-26 LAB — POCT I-STAT, CHEM 8
BUN: 79 mg/dL — ABNORMAL HIGH (ref 8–23)
Calcium, Ion: 1.14 mmol/L — ABNORMAL LOW (ref 1.15–1.40)
Chloride: 105 mmol/L (ref 98–111)
Creatinine, Ser: 7.9 mg/dL — ABNORMAL HIGH (ref 0.61–1.24)
Glucose, Bld: 113 mg/dL — ABNORMAL HIGH (ref 70–99)
HCT: 35 % — ABNORMAL LOW (ref 39.0–52.0)
Hemoglobin: 11.9 g/dL — ABNORMAL LOW (ref 13.0–17.0)
Potassium: 3.9 mmol/L (ref 3.5–5.1)
Sodium: 140 mmol/L (ref 135–145)
TCO2: 19 mmol/L — ABNORMAL LOW (ref 22–32)

## 2020-09-26 LAB — CBC
HCT: 34 % — ABNORMAL LOW (ref 39.0–52.0)
Hemoglobin: 10.6 g/dL — ABNORMAL LOW (ref 13.0–17.0)
MCH: 26.7 pg (ref 26.0–34.0)
MCHC: 31.2 g/dL (ref 30.0–36.0)
MCV: 85.6 fL (ref 80.0–100.0)
Platelets: 193 10*3/uL (ref 150–400)
RBC: 3.97 MIL/uL — ABNORMAL LOW (ref 4.22–5.81)
RDW: 15.8 % — ABNORMAL HIGH (ref 11.5–15.5)
WBC: 8.6 10*3/uL (ref 4.0–10.5)
nRBC: 0 % (ref 0.0–0.2)

## 2020-09-26 LAB — IRON AND TIBC
Iron: 37 ug/dL — ABNORMAL LOW (ref 45–182)
Saturation Ratios: 12 % — ABNORMAL LOW (ref 17.9–39.5)
TIBC: 316 ug/dL (ref 250–450)
UIBC: 279 ug/dL

## 2020-09-26 LAB — GLUCOSE, CAPILLARY
Glucose-Capillary: 118 mg/dL — ABNORMAL HIGH (ref 70–99)
Glucose-Capillary: 117 mg/dL — ABNORMAL HIGH (ref 70–99)
Glucose-Capillary: 129 mg/dL — ABNORMAL HIGH (ref 70–99)
Glucose-Capillary: 111 mg/dL — ABNORMAL HIGH (ref 70–99)

## 2020-09-26 LAB — FERRITIN: Ferritin: 92 ng/mL (ref 24–336)

## 2020-09-26 MED ORDER — SODIUM CHLORIDE 0.9 % IV SOLN: 100.0000 mL | INTRAVENOUS | Status: DC | PRN

## 2020-09-26 MED ORDER — HEPARIN SODIUM (PORCINE) 1000 UNIT/ML DIALYSIS: 1000.0000 [IU] | INTRAMUSCULAR | Status: DC | PRN

## 2020-09-26 MED ORDER — ALTEPLASE 2 MG IJ SOLR: 2.0000 mg | Freq: Once | INTRAMUSCULAR | Status: DC | PRN

## 2020-09-26 MED ORDER — LIDOCAINE-PRILOCAINE 2.5-2.5 % EX CREA: 1.0000 "application " | TOPICAL_CREAM | CUTANEOUS | Status: DC | PRN

## 2020-09-26 MED ORDER — PENTAFLUOROPROP-TETRAFLUOROETH EX AERO: 1.0000 "application " | INHALATION_SPRAY | CUTANEOUS | Status: DC | PRN

## 2020-09-26 MED ORDER — LIDOCAINE HCL (PF) 1 % IJ SOLN: 5.0000 mL | INTRAMUSCULAR | Status: DC | PRN

## 2020-09-26 MED ORDER — HEPARIN SODIUM (PORCINE) 1000 UNIT/ML IJ SOLN
INTRAMUSCULAR | Status: AC
  Administered 2020-09-26: 1000 [IU] via INTRAVENOUS_CENTRAL
  Filled 2020-09-26: qty 4

## 2020-09-26 NOTE — Progress Notes (Signed)
Patient has been accepted for outpatient HD at [facility] on a TTS schedule with a seat time of 12:45pm. He needs to arrive to his appointments at 12:25pm. Patient's first day at the clinic will be Tuesday, 09/30/20 (I anticipate he will be discharged by this point) and patient needs to arrive to the clinic at 11:45am in order to complete intake paperwork prior to his first treatment. I have discussed with his daughter regarding my recommendation for her to sign consents for treatment along with her father since he has shown difficulty remembering conversations throughout this hospitalization. She agrees and will meet her father at the clinic on Tuesday at 11:45am. Navigator updated Nephrologist, Attending and CSW.  Patient cleared for discharge from Nephrology standpoint any time after HD today. He has been on a MWF HD schedule at the hospital but, per Dr. Carolin Sicks, is cleared to have his next treatment at the outpatient clinic on Tuesday, 11/16.  Renal Navigator asked Renal PA to send HD orders to clinic.  Alphonzo Cruise,  Renal Navigator [phone]

## 2020-09-26 NOTE — TOC Progression Note (Addendum)
Transition of Care (TOC) - Progression Note    Patient Details  Name: [name]. MRN: [id number] Date of Birth: 16-Feb-1951  Transition of Care [facility]) CM/SW Contact  Sharlet Salina Mila Homer, LCSW Phone Number: 09/26/2020, 5:16 PM  Clinical Narrative:  CSW received call from Apolonio Schneiders with [employer] and insurance authorization information provided: Reference [id number] (authorization # not generated yet); Approved for 5 days eff. 11/12 and 11/16 is next review date. Care coordinator is Barb Merino and the fax number for clinicals is [phone].  4:51 pm: Call made to Harriet Pho, admissions liaison with [facility] and informed her of insurance auth. Also informed Juliann Pulse that patient will be transported to dialysis by [facility] and asked if they can call to schedule dialysis transport. Juliann Pulse was unsure about [facility] [employer] and indicated that she will get back with CSW. Authorization information texted to [place].  5:23 pm: Call made to [facility] with [facility] regarding patient and when he can discharge to [facility]. She will check and get back with CSW. Also informed Juliann Pulse that [place] is [facility].   5:43 pm: Received text from [facility] that patient can d/c tomorrow after dialysis. Informed her that patient was in dialysis today and will start at [facility] on Tuesday, 11/16. She was provided with the dialysis information: [facility] [facility] ([place]) TTS; chair time 12:45 pm and arrival time 12:25 pm.       Expected Discharge Plan: [place] Barriers to Discharge: Continued Medical Work up  Expected Discharge Plan and Services Expected Discharge Plan: [place] In-house Referral: Clinical Social Work     Living arrangements for the past 2 months: Single Family Home                                       Social Determinants of Health (SDOH) Interventions    Readmission Risk Interventions No flowsheet data  found.

## 2020-09-26 NOTE — Progress Notes (Signed)
PROGRESS NOTE    Elmore.  UXN:[id number] DOB: 1951/03/07 DOA: 09/08/2020 PCP: Aura Dials, MD   Chief Complaint  Patient presents with  . Wound Infection    Brief Narrative:  69 year old male with U5KY, chronic systolic and diastolic CHF, PAF supposed to be on Eliquis, HTN, morbid obesity brought to ED 09/08/2020 after he was found down in his house.  Reportedly patient was followed.  Protective services, has very unsanitary living condition at home and was found down in the bathroom during welfare check. Per report: Patient reports falling while standing up from bathroom without LOC, resulting in fall onto R chest and resultant R lower chest pain. Patient was found down and brought into ED.  Of note, patient reports stopping all medications several months ago with exception of torsemide because "I didn't think I needed them anymore." Pt has only been taking torsemide PRN swelling.  He was found to have necrotic foot infections and underwent xray of right foot, which revealed subcortical lucency involving 5th digit concerning for OM.  He was started on vancomycin and cefepime and orthopedic surgery was consulted. ABIs were also ordered. Test was inconclusive due to severe lower extremity edema.   Initially patient was also against surgical treatment options despite severity of his wounds, their obvious chronicity, and inability to explain consequences for avoiding surgery. We requested psychiatry to evaluate patient for decision making ability in this setting. After repeat explanation of his diagnosis and possible treatment options he was able to voice adequate understanding to psychiatry and was considered to have decision making capacity regarding his current infection.   He will need to continue with serial compression wrappings at discharge in efforts to promote better chance of wound healing and proper assessment of ABIs to help decide best treatment course.   Given  his significant deconditioning and after discussion with his daughter, there is concern for his ability to safely return home due to poor living situation; Physical therapy was consulted. After PT evaluation he was recommended for SNF at discharge. He was accepting of this and placement was pursued.  Overall, he had good clinical response to IV antibiotics. He was deescalated to Augmentin and doxycycline to finish out a course. He will continue with serial compression wrappings at discharge to rehab as well as following up with Ortho for further management.  During hospitalization he was started on IV Lasix for diuresis given his severe lower extremity edema. His creatinine continued to slowly uptrend. Urine output measuring was inaccurate and difficult to gauge response. Nephrology was consulted on 09/14/2020 given his uptrending creatinine in setting of multiple antibiotics initially and some transient hypotension.  Renal ultrasound showed no hydronephrosis.  He underwent temporary HD catheter placement by IR on 09/16/2020 and nephrology recommended transfer to [facility] for possible short-term hemodialysis. Noncompliant in general and very self-neglected with denial component. Admitted with horrendous feet/legs. Has osteo in R foot 5th digit. Very well possibly in left foot too chronically.  Too edematous for good ABI's per ortho so getting serial compression wrapping with plans for outpt follow up with ortho (on augmentin/doxy now for OM tx).  Patient was transferred to [facility] 11/3 and dialysis was started. Patient underwent dialysis multiple sessions, no renal recovery.  Some urine output and hematuria. Seen by palliative care and ongoing discussion involving family members.  Patient wanted to proceed with dialysis on outpatient basis. 09/24/20: Underwent right IJ tunneled hemodialysis catheter placement and left radiocephalic AV fistula creation by  Dr. Donnetta Hutching September 26, 2020:  Patient seen.  No new complaints.  For hemodialysis today.  Patient is awaiting placement.  Discussed case with patient's nurse.  Subjective: No new symptoms. No fever or chills No shortness of breath or chest pain.   Assessment & Plan:  AKI on CKD stage III with baseline creatinine 1.3-1.9: Seen by Nephrology -etiology multifactorial including cardiorenal syndrome, ATN.  Now on dialysis since 11/3 and tolerating well, at times episode of hypotension and is started on midodrine.  Tried to high-dose IV Lasix without response, at this time no renal recovery, family meeting convened 11/9 plan is to proceed with dialysis. S/P Rtt IJ tunneled HD  catheter placement and left radiocephalic AV fistula creation by Dr. Donnetta Hutching 11/10.  Continue dialysis while here and noted arrangements have been made for outpatient dialysis.  September 26, 2020: AKI is severe.  Not HD dependent.  Nephrology team is managing.  Outpatient dialysis chair has been proceeded.  Nephrology input is highly appreciated.  Acute on chronic combined systolic and diastolic CHF: LVEF 22-97% in LVEF, previously back in March EF was 40-45%: TTE shows lvef 30-35%, severe concentric LVH, global hypokinesia.  Now dialysis dependent and volume will be managed in dialysis  September 26, 2020: Stable.  Hypotension: BP soft, continue midodrine.  Continue metoprolol w/ holding parameters for a fib-May need to discontinue again since BP soft September 26, 2020: Stable.  Likely multifactorial, including low EF.  Extensive bilateral lower extremity edema wound on right foot,Osteomyelitis of fifth toe of right foot per x-ray, ABI indeterminate due to lower extremity edema.  Seen by Dr. Sharol Given offered amputation but patient refused.  Family agreed with patient decision not to pursue amputation.  Plan is to continue aggressive wound care, outpatient doxycycline long-term, follow-up with Dr. Sharol Given as outpatient, SNF placement.  Continue wound care.   Chronic  lymphedema: Continue Una boot, close wound care follow-up.   Falls at home/neuropathy: Continue PT OT, will need skilled nursing facility placement.  Hypokalemia resolved  Severe sepsis POA: Sepsis physiology has resolved.  Acute encephalopathy, uremic/septic on presentation per chArt. At this time mental status is stable.    PAF with LGX:QJJHERDEYCXK with meds.continue metoprolol blood pressure tolerates otherwise can discontinue.  Got the procedure done yesterday if okay with nephrology will switch to Eliquis- pharmacy to adjust dose 2/2 AKI.  Hematuria after placing foley catheter suspect catheter related trauma: off Foley 11/9. Patient denies any more hematuria.  Type 2 diabetes mellitus: A1c uncontrolled 9.6 10/25.  Blood sugar is controlled on sliding scale.  Blood sugar stable on sliding scale insulin Recent Labs  Lab 09/25/20 1141 09/25/20 1627 09/25/20 2108 09/26/20 0648 09/26/20 1127  GLUCAP 126* 114* 119* 118* 117*   Morbid obesity with BMI 43>41, he will benefit with weight loss healthy lifestyle outpatient sleep apnea evaluation but I am not sure how much compliant of feasible it is for him.  GOC: Family meeting completed  09/23/20 morning plan is to proceed with dialysis outpatient skilled nursing facility rehab.  Patient is at risk of decompensation will need close follow-up monitoring continue dialysis as per nephrology.  He has refused amputation and will need to follow-up with Dr. Sharol Given and continue on long-term antibiotics.    Nutrition: Diet Order            Diet renal with fluid restriction Fluid restriction: 1200 mL Fluid; Room service appropriate? Yes; Fluid consistency: Thin  Diet effective now  Nutrition Problem: Increased nutrient needs Etiology: wound healing, acute illness Signs/Symptoms: estimated needs Interventions: MVI, Other (Comment) (Prosource Plus)  Body mass index is 40.7 kg/m.  Pressure Ulcer: Pressure Injury 09/08/20  Heel Right Stage 3 -  Full thickness tissue loss. Subcutaneous fat may be visible but bone, tendon or muscle are NOT exposed. 25% red, 75% slouogh, strong odor, mod brown (Active)  09/08/20   Location: Heel  Location Orientation: Right  Staging: Stage 3 -  Full thickness tissue loss. Subcutaneous fat may be visible but bone, tendon or muscle are NOT exposed.  Wound Description (Comments): 25% red, 75% slouogh, strong odor, mod brown  Present on Admission: Yes     Pressure Injury 09/17/20 Buttocks Right;Left Deep Tissue Pressure Injury - Purple or maroon localized area of discolored intact skin or blood-filled blister due to damage of underlying soft tissue from pressure and/or shear. (Active)  09/17/20 1805  Location: Buttocks  Location Orientation: Right;Left  Staging: Deep Tissue Pressure Injury - Purple or maroon localized area of discolored intact skin or blood-filled blister due to damage of underlying soft tissue from pressure and/or shear.  Wound Description (Comments):   Present on Admission:    DVT prophylaxis: heparin gtt Code Status:  Code Status: Full Code  Family Communication: plan of care discussed with patient.  Daughters have been updated multiple times.  Family meeting completed 09/23/2020 at bedside along with nephrology and palliative care be RN.  Status LN:LGXQJJHER Remains inpatient appropriate because:IV treatments appropriate due to intensity of illness or inability to take PO and Inpatient level of care appropriate due to severity of illness  Dispo:The patient is from: Home            Anticipated d/c is to: SNF            Anticipated d/c date is: Once SNF and Outpatient dialysis has been arranged he can be discharged to SNF             Patient currently is not medically stable to d/c. Consultants:see note  Procedures:see note  Culture/Microbiology    Component Value Date/Time   SDES  09/08/2020 2103    BLOOD LEFT HAND Performed at [facility], [place] [address], [place], [place] [address]    SPECREQUEST  09/08/2020 2103    BOTTLES DRAWN AEROBIC AND ANAEROBIC Blood Culture adequate volume Performed at [place] [address] [place], [place] [address]    CULT  09/08/2020 2103    NO GROWTH 5 DAYS Performed at [place] [address], [place], [place] [address]    REPTSTATUS 09/13/2020 FINAL 09/08/2020 2103    Other culture-see note  Medications: Scheduled Meds: . (feeding supplement) PROSource Plus  30 mL Oral QID  . amoxicillin-clavulanate  1 tablet Oral Daily  . apixaban  5 mg Oral BID  . Chlorhexidine Gluconate Cloth  6 each Topical Q0600  . doxycycline  100 mg Oral Q12H  . insulin aspart  0-15 Units Subcutaneous TID WC  . insulin aspart  0-5 Units Subcutaneous QHS  . metoprolol tartrate  12.5 mg Oral BID  . midodrine  10 mg Oral TID WC  . multivitamin  1 tablet Oral QHS  . Ensure Max Protein  11 oz Oral BID  . traZODone  25 mg Oral QHS   Continuous Infusions:   Antimicrobials: Anti-infectives (From admission, onward)   Start     Dose/Rate Route Frequency Ordered Stop   09/17/20 1000  amoxicillin-clavulanate (  AUGMENTIN) 500-125 MG per tablet 500 mg        1 tablet Oral Daily 09/17/20 0839     09/14/20 2200  amoxicillin-clavulanate (AUGMENTIN) 500-125 MG per tablet 500 mg  Status:  Discontinued        1 tablet Oral 2 times daily 09/14/20 1524 09/17/20 0839   09/12/20 1000  amoxicillin-clavulanate (AUGMENTIN) 875-125 MG per tablet 1 tablet  Status:  Discontinued        1 tablet Oral Every 12 hours 09/12/20 0823 09/14/20 1524   09/12/20 1000  doxycycline (VIBRA-TABS) tablet 100 mg        100 mg Oral Every 12 hours 09/12/20 0823     09/10/20 2000  DAPTOmycin (CUBICIN) 740 mg in sodium chloride 0.9 % IVPB  Status:  Discontinued        740 mg 229.6 mL/hr over 30 Minutes Intravenous Daily 09/10/20 1230 09/12/20 0822   09/10/20 1200  metroNIDAZOLE (FLAGYL) IVPB 500 mg   Status:  Discontinued        500 mg 100 mL/hr over 60 Minutes Intravenous Every 8 hours 09/10/20 1050 09/12/20 0822   09/10/20 1132  vancomycin variable dose per unstable renal function (pharmacist dosing)  Status:  Discontinued         Does not apply See admin instructions 09/10/20 1132 09/10/20 1230   09/09/20 1700  vancomycin (VANCOREADY) IVPB 1500 mg/300 mL  Status:  Discontinued        1,500 mg 150 mL/hr over 120 Minutes Intravenous Every 24 hours 09/08/20 1956 09/10/20 1132   09/09/20 1000  ceFEPIme (MAXIPIME) 2 g in sodium chloride 0.9 % 100 mL IVPB  Status:  Discontinued        2 g 200 mL/hr over 30 Minutes Intravenous Every 12 hours 09/08/20 1947 09/12/20 0822   09/08/20 2200  ceFEPIme (MAXIPIME) 2 g in sodium chloride 0.9 % 100 mL IVPB        2 g 200 mL/hr over 30 Minutes Intravenous  Once 09/08/20 1754 09/08/20 2206   09/08/20 1800  vancomycin (VANCOCIN) IVPB 1000 mg/200 mL premix  Status:  Discontinued        1,000 mg 200 mL/hr over 60 Minutes Intravenous  Once 09/08/20 1754 09/08/20 1756   09/08/20 1630  vancomycin (VANCOREADY) IVPB 2000 mg/400 mL        2,000 mg 200 mL/hr over 120 Minutes Intravenous  Once 09/08/20 1551 09/08/20 1842   09/08/20 1600  piperacillin-tazobactam (ZOSYN) IVPB 3.375 g        3.375 g 100 mL/hr over 30 Minutes Intravenous  Once 09/08/20 1549 09/08/20 1645     Objective: Vitals: Today's Vitals   09/26/20 0800 09/26/20 0818 09/26/20 0946 09/26/20 1050  BP: 99/79  107/66   Pulse: (!) 120 (!) 110 (!) 120 (!) 115  Resp: 18     Temp: 98.4 F (36.9 C)     TempSrc: Oral     SpO2: 98%  98%   Weight:      Height:      PainSc: 2        Intake/Output Summary (Last 24 hours) at 09/26/2020 1157 Last data filed at 09/26/2020 1145 Gross per 24 hour  Intake 1040 ml  Output 200 ml  Net 840 ml   Filed Weights   09/24/20 2310 09/25/20 2106 09/26/20 0500  Weight: 125 kg 125 kg 125 kg   Weight change: -0.77 kg  Intake/Output from previous  day: 11/11 0701 - 11/12 0700 In: 1260 [P.O.:1260]  Out: 200 [Urine:200] Intake/Output this shift: Total I/O In: 240 [P.O.:240] Out: -   Examination: General exam: AAOx3, obese, NAD, weak appearing. HEENT:Oral mucosa moist, Ear/Nose WNL grossly, dentition normal. Respiratory system: bilaterally clear,no wheezing or crackles,no use of accessory muscle Cardiovascular system: S1 & S2 +, No JVD,. Gastrointestinal system: Abdomen soft, NT,ND, BS+ Nervous System:Alert, awake, moving extremities and grossly nonfocal Extremities: Bilateral edematous leg in UNA boot dressing- see pic below from 11/10 priro to current dressing Skin: No rashes,no icterus. MSK: Normal muscle bulk,tone, power          Data Reviewed: I have personally reviewed following labs and imaging studies CBC: Recent Labs  Lab 09/20/20 0503 09/20/20 0503 09/21/20 0321 09/21/20 0321 09/22/20 0301 09/22/20 0301 09/23/20 0340 09/23/20 0340 09/23/20 1127 09/24/20 0410 09/24/20 1345 09/25/20 0506 09/26/20 0144  WBC 10.5   < > 8.2   < > 8.1  --  6.5  --   --  6.1  --  6.3 8.6  NEUTROABS 7.1  --  6.2  --  5.5  --  3.7  --   --   --   --   --   --   HGB 11.3*   < > 10.6*   < > 11.0*   < > 11.0*   < > 10.5* 10.5* 11.9* 9.8* 10.6*  HCT 36.3*   < > 34.0*   < > 36.4*   < > 36.5*   < > 35.0* 33.9* 35.0* 31.6* 34.0*  MCV 86.2   < > 85.6   < > 88.1  --  88.6  --   --  89.0  --  85.4 85.6  PLT 348   < > 299   < > 291  --  252  --   --  243  --  186 193   < > = values in this interval not displayed.   Basic Metabolic Panel: Recent Labs  Lab 09/20/20 0503 09/20/20 0503 09/21/20 0321 09/21/20 0321 09/22/20 0301 09/23/20 0340 09/24/20 1345 09/25/20 0506 09/26/20 0144  NA 134*   < > 135   < > 137 137 140 137 136  K 3.5   < > 3.1*   < > 3.9 3.6 3.9 3.5 3.9  CL 99   < > 101   < > 104 102 105 101 101  CO2 24   < > 21*  --  19* 22  --  23 22  GLUCOSE 130*   < > 96   < > 116* 110* 113* 100* 140*  BUN 39*   < > 49*    < > 63* 52* 79* 39* 60*  CREATININE 4.79*   < > 6.05*   < > 6.93* 6.15* 7.90* 5.24* 6.79*  CALCIUM 8.3*   < > 8.5*  --  8.7* 8.3*  --  8.0* 8.3*  MG 1.8  --   --   --   --   --   --   --   --   PHOS 5.7*  --   --   --   --   --   --   --  6.9*   < > = values in this interval not displayed.   GFR: Estimated Creatinine Clearance: 13 mL/min (A) (by C-G formula based on SCr of 6.79 mg/dL (H)). Liver Function Tests: Recent Labs  Lab 09/26/20 0144  ALBUMIN 3.1*   No results for input(s): LIPASE, AMYLASE in the last 168 hours. No  results for input(s): AMMONIA in the last 168 hours. Coagulation Profile: No results for input(s): INR, PROTIME in the last 168 hours. Cardiac Enzymes: No results for input(s): CKTOTAL, CKMB, CKMBINDEX, TROPONINI in the last 168 hours. BNP (last 3 results) No results for input(s): PROBNP in the last 8760 hours. HbA1C: No results for input(s): HGBA1C in the last 72 hours. CBG: Recent Labs  Lab 09/25/20 1141 09/25/20 1627 09/25/20 2108 09/26/20 0648 09/26/20 1127  GLUCAP 126* 114* 119* 118* 117*   Lipid Profile: No results for input(s): CHOL, HDL, LDLCALC, TRIG, CHOLHDL, LDLDIRECT in the last 72 hours. Thyroid Function Tests: No results for input(s): TSH, T4TOTAL, FREET4, T3FREE, THYROIDAB in the last 72 hours. Anemia Panel: Recent Labs    09/26/20 0144  FERRITIN 92  TIBC 316  IRON 37*   Sepsis Labs: No results for input(s): PROCALCITON, LATICACIDVEN in the last 168 hours.  No results found for this or any previous visit (from the past 240 hour(s)).   Radiology Studies: DG Chest 1 View  Result Date: 09/24/2020 CLINICAL DATA:  69 year old male with central line placement. EXAM: CHEST  1 VIEW COMPARISON:  Chest radiograph dated 09/12/2020 FINDINGS: Right IJ central venous line with tip close to the cavoatrial junction. No pneumothorax. There is no focal consolidation, or pleural effusion. The cardiac silhouette is within limits. Atherosclerotic  calcification of the aorta. There is mild vascular congestion. No acute osseous pathology. IMPRESSION: 1. Right IJ central venous line with tip close to the cavoatrial junction. No pneumothorax. 2. Mild vascular congestion. Electronically Signed   By: Anner Crete M.D.   On: 09/24/2020 18:01   DG Fluoro Guide CV Line-No Report  Result Date: 09/24/2020 Fluoroscopy was utilized by the requesting physician.  No radiographic interpretation.     LOS: 18 days   Bonnell Public, MD [facility]  09/26/2020, 11:57 AM

## 2020-09-26 NOTE — Progress Notes (Signed)
Renal Navigator checking on status of outpatient seat status and will continue to follow closely.  Alphonzo Cruise, [place] Renal Navigator [phone]

## 2020-09-26 NOTE — Progress Notes (Signed)
Student RN gave report to dialysis staff via phone with assigned RN present in room. Advised they will come and get him shortly

## 2020-09-26 NOTE — Care Management Important Message (Signed)
Important Message  Patient Details  Name: [name]. MRN: [id number] Date of Birth: 12-17-1950   Medicare Important Message Given:  Yes     [name] 09/26/2020, 11:19 AM

## 2020-09-26 NOTE — Progress Notes (Signed)
[facility] NEPHROLOGY PROGRESS NOTE  Assessment/ Plan: Pt is a 69 y.o. yo male with history of CHF, DM, A. fib, hypertension found down at home during welfare check currently followed by APS.  In ED he had A. fib with RVR, diabetic foot infection and acute kidney injury.  #Acute kidney injury on CKD stage IIIb: Baseline creatinine level 1.3-1.9.  AKI multifactorial etiology including cardiorenal syndrome, ATN.  Started dialysis on 11/3 and has been tolerating well.  Tried high-dose IV diuretics without response.   No sign of renal recovery.  We had a meeting in the presence of palliative care on 11/9 with the patient and his daughter.  They agreed to dialysis and permanent access. RIJ TDC and left radiocephalic AV fistula created by Dr. Donnetta Hutching on 11/10. Plan for HD today. He will also need outpatient HD arranged for AKI.  Discussed with Education officer, museum, renal navigator.  Okay to discharge from renal perspective when OP HD arranged.  #CHF with systolic and diastolic dysfunction: Volume overload causing cardiorenal syndrome.  Did not respond with IV diuretics therefore now volume management with dialysis. Discontinue diuretics.  #Hypertension: On metoprolol for A. fib.  Requiring midodrine for intradialytic hypotension.  #Sepsis with osteomyelitis of fifth toe on the right foot: On antibiotics per primary team.  #Anemia due to sepsis, AKI: Hemoglobin at goal, iron saturation is low however I will hold IV iron because of infection and currently on antibiotics.  #Metabolic acidosis: Managed with dialysis now.  #Bleeding from tunnel catheter site: The temporary catheter is out.  No bleeding.  #A. fib with RVR: No further bleeding, okay to resume Eliquis.   Subjective: Seen and examined at bedside.  No new new event.  He is eager to go home.  Sitting on chair comfortable.  Denies nausea vomiting chest pain shortness of breath.  Plan for HD today. Objective Vital signs in last 24  hours: Vitals:   09/26/20 0500 09/26/20 0559 09/26/20 0800 09/26/20 0818  BP:  94/71 99/79   Pulse:  98 (!) 120 (!) 110  Resp:  18 18   Temp:  98 F (36.7 C) 98.4 F (36.9 C)   TempSrc:   Oral   SpO2:  99% 98%   Weight: 125 kg     Height:       Weight change: -0.77 kg  Intake/Output Summary (Last 24 hours) at 09/26/2020 0900 Last data filed at 09/26/2020 0559 Gross per 24 hour  Intake 800 ml  Output 200 ml  Net 600 ml       Labs: Basic Metabolic Panel: Recent Labs  Lab 09/20/20 0503 09/21/20 0321 09/23/20 0340 09/23/20 0340 09/24/20 1345 09/25/20 0506 09/26/20 0144  NA 134*   < > 137   < > 140 137 136  K 3.5   < > 3.6   < > 3.9 3.5 3.9  CL 99   < > 102   < > 105 101 101  CO2 24   < > 22  --   --  23 22  GLUCOSE 130*   < > 110*   < > 113* 100* 140*  BUN 39*   < > 52*   < > 79* 39* 60*  CREATININE 4.79*   < > 6.15*   < > 7.90* 5.24* 6.79*  CALCIUM 8.3*   < > 8.3*  --   --  8.0* 8.3*  PHOS 5.7*  --   --   --   --   --  6.9*   < > = values in this interval not displayed.   Liver Function Tests: Recent Labs  Lab 09/26/20 0144  ALBUMIN 3.1*   No results for input(s): LIPASE, AMYLASE in the last 168 hours. No results for input(s): AMMONIA in the last 168 hours. CBC: Recent Labs  Lab 09/21/20 0321 09/21/20 0321 09/22/20 0301 09/22/20 0301 09/23/20 0340 09/23/20 1127 09/24/20 0410 09/24/20 0410 09/24/20 1345 09/25/20 0506 09/26/20 0144  WBC 8.2   < > 8.1   < > 6.5  --  6.1  --   --  6.3 8.6  NEUTROABS 6.2  --  5.5  --  3.7  --   --   --   --   --   --   HGB 10.6*   < > 11.0*   < > 11.0*   < > 10.5*   < > 11.9* 9.8* 10.6*  HCT 34.0*   < > 36.4*   < > 36.5*   < > 33.9*   < > 35.0* 31.6* 34.0*  MCV 85.6   < > 88.1  --  88.6  --  89.0  --   --  85.4 85.6  PLT 299   < > 291   < > 252  --  243  --   --  186 193   < > = values in this interval not displayed.   Cardiac Enzymes: No results for input(s): CKTOTAL, CKMB, CKMBINDEX, TROPONINI in the last 168  hours. CBG: Recent Labs  Lab 09/25/20 0631 09/25/20 1141 09/25/20 1627 09/25/20 2108 09/26/20 0648  GLUCAP 94 126* 114* 119* 118*    Iron Studies:  Recent Labs    09/26/20 0144  IRON 37*  TIBC 316  FERRITIN 92   Studies/Results: DG Chest 1 View  Result Date: 09/24/2020 CLINICAL DATA:  69 year old male with central line placement. EXAM: CHEST  1 VIEW COMPARISON:  Chest radiograph dated 09/12/2020 FINDINGS: Right IJ central venous line with tip close to the cavoatrial junction. No pneumothorax. There is no focal consolidation, or pleural effusion. The cardiac silhouette is within limits. Atherosclerotic calcification of the aorta. There is mild vascular congestion. No acute osseous pathology. IMPRESSION: 1. Right IJ central venous line with tip close to the cavoatrial junction. No pneumothorax. 2. Mild vascular congestion. Electronically Signed   By: Anner Crete M.D.   On: 09/24/2020 18:01   DG Fluoro Guide CV Line-No Report  Result Date: 09/24/2020 Fluoroscopy was utilized by the requesting physician.  No radiographic interpretation.    Medications: Infusions:   Scheduled Medications: . (feeding supplement) PROSource Plus  30 mL Oral QID  . amoxicillin-clavulanate  1 tablet Oral Daily  . apixaban  5 mg Oral BID  . Chlorhexidine Gluconate Cloth  6 each Topical Q0600  . doxycycline  100 mg Oral Q12H  . insulin aspart  0-15 Units Subcutaneous TID WC  . insulin aspart  0-5 Units Subcutaneous QHS  . metoprolol tartrate  12.5 mg Oral BID  . midodrine  10 mg Oral TID WC  . multivitamin  1 tablet Oral QHS  . Ensure Max Protein  11 oz Oral BID  . traZODone  25 mg Oral QHS    have reviewed scheduled and prn medications.  Physical Exam: General:NAD, comfortable, sitting on chair comfortable Heart:RRR, s1s2 nl Lungs: Basal decreased breath sound Abdomen:soft, Non-tender, non-distended Extremities: Edema present and chronic stasis changes. Dialysis Access: Right IJ  TDC, left upper extremity AV fistula site has no bleeding.  Minimal swelling Neurology: Alert awake and oriented x3.  Tate Zagal Prasad Naveed Humphres 09/26/2020,9:00 AM  LOS: 18 days  Pager: [phone]

## 2020-09-26 NOTE — Progress Notes (Signed)
Pt returned to room from hemodialysis. He is awake and alert. Denies any pain. Offered dinner tray but refused stating "I'm just not hungry and I'm tired". Medicated with scheduled proamitine. Pt had low bp's while In dialysis however, resolved prior to returning to floor. (see vs sheet) Tele monitor in place. Afib noted with HR 111. Will continue to monitor.

## 2020-09-27 DIAGNOSIS — M869 Osteomyelitis, unspecified: Secondary | ICD-10-CM | POA: Diagnosis not present

## 2020-09-27 LAB — RESP PANEL BY RT PCR (RSV, FLU A&B, COVID)
Influenza A by PCR: NEGATIVE
Influenza B by PCR: NEGATIVE
Respiratory Syncytial Virus by PCR: NEGATIVE
SARS Coronavirus 2 by RT PCR: POSITIVE — AB

## 2020-09-27 LAB — GLUCOSE, CAPILLARY
Glucose-Capillary: 100 mg/dL — ABNORMAL HIGH (ref 70–99)
Glucose-Capillary: 110 mg/dL — ABNORMAL HIGH (ref 70–99)
Glucose-Capillary: 92 mg/dL (ref 70–99)
Glucose-Capillary: 106 mg/dL — ABNORMAL HIGH (ref 70–99)

## 2020-09-27 LAB — SARS CORONAVIRUS 2 BY RT PCR (HOSPITAL ORDER, PERFORMED IN ~~LOC~~ HOSPITAL LAB): SARS Coronavirus 2: POSITIVE — AB

## 2020-09-27 MED ORDER — CHLORHEXIDINE GLUCONATE CLOTH 2 % EX PADS: 6.0000 | MEDICATED_PAD | Freq: Every day | CUTANEOUS | Status: DC

## 2020-09-27 MED ORDER — METOPROLOL TARTRATE 5 MG/5ML IV SOLN
2.5000 mg | INTRAVENOUS | Status: DC | PRN
  Administered 2020-09-27 – 2020-09-29 (×4): 2.5 mg via INTRAVENOUS
  Filled 2020-09-27 (×5): qty 5

## 2020-09-27 NOTE — Progress Notes (Signed)
Manufacturing engineer [facility]) Community Based Palliative Care       This patient has been referred to our palliative care services in the community.  [facility] will continue to follow for any discharge planning needs and to coordinate admission onto outpatient palliative care.   If you have questions or need assistance, please call contact the hospital Liaison listed on AMION.     Thank you for the opportunity to participate in this patient's care.     Domenic Moras, BSN, RN [facility] Liaison   [phone]

## 2020-09-27 NOTE — Progress Notes (Signed)
[name]  Assessment/ Plan: Pt is a 69 y.o. yo male with history of CHF, DM, A. fib, hypertension found down at home during welfare check currently followed by APS.  In ED he had A. fib with RVR, diabetic foot infection and acute kidney injury.  #Acute kidney injury on CKD stage IIIb: Baseline creatinine level 1.3-1.9.  AKI multifactorial etiology including cardiorenal syndrome, ATN.  Started dialysis on 11/3 and has been tolerating well.  Tried high-dose IV diuretics without response.   No sign of renal recovery.  We had a meeting in the presence of palliative care on 11/9 with the patient and his daughter.  They agreed to dialysis and permanent access. RIJ TDC and left radiocephalic AV fistula created by Dr. Donnetta Hutching on 11/10. Last HD yesterday with 1.2 L UF.  He has OP HD unit arranged at [facility] clinic TTS schedule to arrive at 1225 PM.  Plan for HD today to put him TTS schedule. Okay to discharge from renal perspective.  #CHF with systolic and diastolic dysfunction: Volume overload causing cardiorenal syndrome.  Did not respond with IV diuretics therefore now volume management with dialysis. Discontinue diuretics.  #Hypertension: On metoprolol for A. fib.  Requiring midodrine for intradialytic hypotension.  #Sepsis with osteomyelitis of fifth toe on the right foot: On antibiotics per primary team.  #Anemia due to sepsis, AKI: Hemoglobin at goal, iron saturation is low however I will hold IV iron because of infection and currently on antibiotics.  #Metabolic acidosis: Managed with dialysis now.  #Bleeding from tunnel catheter site: The temporary catheter is out.  No bleeding.  #A. fib with RVR: No further bleeding, okay to resume Eliquis.   Subjective: Seen and examined at bedside.  Tolerated dialysis well.  No new event.  Denies nausea vomiting chest pain.  Waiting for nursing home for safe discharge planning.  Objective Vital signs in  last 24 hours: Vitals:   09/27/20 0303 09/27/20 0500 09/27/20 0511 09/27/20 0940  BP: 98/61  122/60 97/63  Pulse: (!) 127  (!) 112 99  Resp: 16  16 17   Temp: 99.3 F (37.4 C)  100 F (37.8 C) 98.1 F (36.7 C)  TempSrc:   Oral   SpO2: 95%  93% 92%  Weight:  123 kg    Height:       Weight change: -0.63 kg  Intake/Output Summary (Last 24 hours) at 09/27/2020 1011 Last data filed at 09/27/2020 0800 Gross per 24 hour  Intake 300 ml  Output 1200 ml  Net -900 ml       Labs: Basic Metabolic Panel: Recent Labs  Lab 09/23/20 0340 09/23/20 0340 09/24/20 1345 09/25/20 0506 09/26/20 0144  NA 137   < > 140 137 136  K 3.6   < > 3.9 3.5 3.9  CL 102   < > 105 101 101  CO2 22  --   --  23 22  GLUCOSE 110*   < > 113* 100* 140*  BUN 52*   < > 79* 39* 60*  CREATININE 6.15*   < > 7.90* 5.24* 6.79*  CALCIUM 8.3*  --   --  8.0* 8.3*  PHOS  --   --   --   --  6.9*   < > = values in this interval not displayed.   Liver Function Tests: Recent Labs  Lab 09/26/20 0144  ALBUMIN 3.1*   No results for input(s): LIPASE, AMYLASE in the last 168 hours. No results  for input(s): AMMONIA in the last 168 hours. CBC: Recent Labs  Lab 09/21/20 0321 09/21/20 0321 09/22/20 0301 09/22/20 0301 09/23/20 0340 09/23/20 1127 09/24/20 0410 09/24/20 0410 09/24/20 1345 09/25/20 0506 09/26/20 0144  WBC 8.2   < > 8.1   < > 6.5  --  6.1  --   --  6.3 8.6  NEUTROABS 6.2  --  5.5  --  3.7  --   --   --   --   --   --   HGB 10.6*   < > 11.0*   < > 11.0*   < > 10.5*   < > 11.9* 9.8* 10.6*  HCT 34.0*   < > 36.4*   < > 36.5*   < > 33.9*   < > 35.0* 31.6* 34.0*  MCV 85.6   < > 88.1  --  88.6  --  89.0  --   --  85.4 85.6  PLT 299   < > 291   < > 252  --  243  --   --  186 193   < > = values in this interval not displayed.   Cardiac Enzymes: No results for input(s): CKTOTAL, CKMB, CKMBINDEX, TROPONINI in the last 168 hours. CBG: Recent Labs  Lab 09/26/20 0648 09/26/20 1127 09/26/20 1817  09/26/20 2120 09/27/20 0632  GLUCAP 118* 117* 129* 111* 100*    Iron Studies:  Recent Labs    09/26/20 0144  IRON 37*  TIBC 316  FERRITIN 92   Studies/Results: No results found.  Medications: Infusions:   Scheduled Medications: . (feeding supplement) PROSource Plus  30 mL Oral QID  . amoxicillin-clavulanate  1 tablet Oral Daily  . apixaban  5 mg Oral BID  . Chlorhexidine Gluconate Cloth  6 each Topical Q0600  . doxycycline  100 mg Oral Q12H  . insulin aspart  0-15 Units Subcutaneous TID WC  . insulin aspart  0-5 Units Subcutaneous QHS  . midodrine  10 mg Oral TID WC  . multivitamin  1 tablet Oral QHS  . Ensure Max Protein  11 oz Oral BID  . traZODone  25 mg Oral QHS    have reviewed scheduled and prn medications.  Physical Exam: General:NAD, comfortable, sitting on chair comfortable Heart:RRR, s1s2 nl Lungs: Basal decreased breath sound Abdomen:soft, Non-tender, non-distended Extremities: Edema present and chronic stasis changes. Dialysis Access: Right IJ TDC, left upper extremity AV fistula site has no bleeding.  Minimal swelling Neurology: Alert awake and oriented x3.  Brittiany Wiehe Prasad Tali Cleaves 09/27/2020,10:11 AM  LOS: 19 days  Pager: [phone]

## 2020-09-27 NOTE — Progress Notes (Signed)
PROGRESS NOTE    [name].  IOX:[id number] DOB: 1951/10/29 DOA: 09/08/2020 PCP: Aura Dials, MD   Chief Complaint  Patient presents with  . Wound Infection    Brief Narrative:  69 year old male with Q6ST, chronic systolic and diastolic CHF, PAF supposed to be on Eliquis, HTN, morbid obesity brought to ED 09/08/2020 after he was found down in his house.  Reportedly patient was followed.  Protective services, has very unsanitary living condition at home and was found down in the bathroom during welfare check. Per report: Patient reports falling while standing up from bathroom without LOC, resulting in fall onto R chest and resultant R lower chest pain. Patient was found down and brought into ED.  Of note, patient reports stopping all medications several months ago with exception of torsemide because "I didn't think I needed them anymore." Pt has only been taking torsemide PRN swelling.  He was found to have necrotic foot infections and underwent xray of right foot, which revealed subcortical lucency involving 5th digit concerning for OM.  He was started on vancomycin and cefepime and orthopedic surgery was consulted. ABIs were also ordered. Test was inconclusive due to severe lower extremity edema.   Initially patient was also against surgical treatment options despite severity of his wounds, their obvious chronicity, and inability to explain consequences for avoiding surgery. We requested psychiatry to evaluate patient for decision making ability in this setting. After repeat explanation of his diagnosis and possible treatment options he was able to voice adequate understanding to psychiatry and was considered to have decision making capacity regarding his current infection.   He will need to continue with serial compression wrappings at discharge in efforts to promote better chance of wound healing and proper assessment of ABIs to help decide best treatment course.   Given  his significant deconditioning and after discussion with his daughter, there is concern for his ability to safely return home due to poor living situation; Physical therapy was consulted. After PT evaluation he was recommended for SNF at discharge. He was accepting of this and placement was pursued.  Overall, he had good clinical response to IV antibiotics. He was deescalated to Augmentin and doxycycline to finish out a course. He will continue with serial compression wrappings at discharge to rehab as well as following up with Ortho for further management.  During hospitalization he was started on IV Lasix for diuresis given his severe lower extremity edema. His creatinine continued to slowly uptrend. Urine output measuring was inaccurate and difficult to gauge response. Nephrology was consulted on 09/14/2020 given his uptrending creatinine in setting of multiple antibiotics initially and some transient hypotension.  Renal ultrasound showed no hydronephrosis.  He underwent temporary HD catheter placement by IR on 09/16/2020 and nephrology recommended transfer to [facility] for possible short-term hemodialysis. Noncompliant in general and very self-neglected with denial component. Admitted with horrendous feet/legs. Has osteo in R foot 5th digit. Very well possibly in left foot too chronically.  Too edematous for good ABI's per ortho so getting serial compression wrapping with plans for outpt follow up with ortho (on augmentin/doxy now for OM tx).  Patient was transferred to [facility] 11/3 and dialysis was started. Patient underwent dialysis multiple sessions, no renal recovery.  Some urine output and hematuria. Seen by palliative care and ongoing discussion involving family members.  Patient wanted to proceed with dialysis on outpatient basis. 09/24/20: Underwent right IJ tunneled hemodialysis catheter placement and left radiocephalic AV fistula creation by  Dr. Donnetta Hutching September 26, 2020:  Patient seen.  No new complaints.  For hemodialysis today.  Patient is awaiting placement.  Discussed case with patient's nurse.  09/27/2020: Patient seen.  No new changes.  Covid PCR test came back positive.  For hemodialysis today.  Subjective: No fever or chills No shortness of breath or chest pain.   Assessment & Plan:  AKI on CKD stage III with baseline creatinine 1.3-1.9: Seen by Nephrology -etiology multifactorial including cardiorenal syndrome, ATN.  Now on dialysis since 11/3 and tolerating well, at times episode of hypotension and is started on midodrine.  Tried to high-dose IV Lasix without response, at this time no renal recovery, family meeting convened 11/9 plan is to proceed with dialysis. S/P Rtt IJ tunneled HD  catheter placement and left radiocephalic AV fistula creation by Dr. Donnetta Hutching 11/10.  Continue dialysis while here and noted arrangements have been made for outpatient dialysis.  September 26, 2020: AKI is severe.  Now HD dependent.  Nephrology team is managing.  Outpatient dialysis chair has been proceeded.  Nephrology input is highly appreciated. 10/07/2020: Continue hemodialysis as per nephrology team.  Acute on chronic combined systolic and diastolic CHF: LVEF 72-09% in LVEF, previously back in March EF was 40-45%: TTE shows lvef 30-35%, severe concentric LVH, global hypokinesia.  Now dialysis dependent and volume will be managed in dialysis  September 27, 2020: Stable.  Hypotension: BP soft, continue midodrine.  Continue metoprolol w/ holding parameters for a fib-May need to discontinue again since BP soft September 27, 2020: Stable.  Likely multifactorial, including low EF.  Extensive bilateral lower extremity edema wound on right foot,Osteomyelitis of fifth toe of right foot per x-ray, ABI indeterminate due to lower extremity edema.  Seen by Dr. Sharol Given offered amputation but patient refused.  Family agreed with patient decision not to pursue amputation.  Plan is to  continue aggressive wound care, outpatient doxycycline long-term, follow-up with Dr. Sharol Given as outpatient, SNF placement.  Continue wound care.   Chronic lymphedema: Continue Una boot, close wound care follow-up.   Falls at home/neuropathy: Continue PT OT, will need skilled nursing facility placement.  Hypokalemia resolved  Severe sepsis POA: Sepsis physiology has resolved.  Acute encephalopathy, uremic/septic on presentation per chArt. At this time mental status is stable.    PAF with OBS:[id number] with meds.continue metoprolol blood pressure tolerates otherwise can discontinue.  Got the procedure done yesterday if okay with nephrology will switch to Eliquis- pharmacy to adjust dose 2/2 AKI.  Hematuria after placing foley catheter suspect catheter related trauma: off Foley 11/9. Patient denies any more hematuria.  Type 2 diabetes mellitus: A1c uncontrolled 9.6 10/25.  Blood sugar is controlled on sliding scale.  Blood sugar stable on sliding scale insulin Recent Labs  Lab 09/26/20 0648 09/26/20 1127 09/26/20 1817 09/26/20 2120 09/27/20 0632  GLUCAP 118* 117* 129* 111* 100*   Morbid obesity with BMI 43>41, he will benefit with weight loss healthy lifestyle outpatient sleep apnea evaluation but I am not sure how much compliant of feasible it is for him.  GOC: Family meeting completed  09/23/20 morning plan is to proceed with dialysis outpatient skilled nursing facility rehab.  Patient is at risk of decompensation will need close follow-up monitoring continue dialysis as per nephrology.  He has refused amputation and will need to follow-up with Dr. Sharol Given and continue on long-term antibiotics.    Nutrition: Diet Order            Diet renal with fluid restriction Fluid  restriction: 1200 mL Fluid; Room service appropriate? Yes; Fluid consistency: Thin  Diet effective now                 Nutrition Problem: Increased nutrient needs Etiology: wound healing, acute  illness Signs/Symptoms: estimated needs Interventions: MVI, Other (Comment) (Prosource Plus)  Body mass index is 40.04 kg/m.  Pressure Ulcer: Pressure Injury 09/08/20 Heel Right Stage 3 -  Full thickness tissue loss. Subcutaneous fat may be visible but bone, tendon or muscle are NOT exposed. 25% red, 75% slouogh, strong odor, mod brown (Active)  09/08/20   Location: Heel  Location Orientation: Right  Staging: Stage 3 -  Full thickness tissue loss. Subcutaneous fat may be visible but bone, tendon or muscle are NOT exposed.  Wound Description (Comments): 25% red, 75% slouogh, strong odor, mod brown  Present on Admission: Yes     Pressure Injury 09/17/20 Buttocks Right;Left Deep Tissue Pressure Injury - Purple or maroon localized area of discolored intact skin or blood-filled blister due to damage of underlying soft tissue from pressure and/or shear. (Active)  09/17/20 1805  Location: Buttocks  Location Orientation: Right;Left  Staging: Deep Tissue Pressure Injury - Purple or maroon localized area of discolored intact skin or blood-filled blister due to damage of underlying soft tissue from pressure and/or shear.  Wound Description (Comments):   Present on Admission:    DVT prophylaxis: heparin gtt Code Status:  Code Status: Full Code  Family Communication: plan of care discussed with patient.  Daughters have been updated multiple times.  Family meeting completed 09/23/2020 at bedside along with nephrology and palliative care be RN.  Status VP:[id number] Remains inpatient appropriate because:IV treatments appropriate due to intensity of illness or inability to take PO and Inpatient level of care appropriate due to severity of illness  Dispo:The patient is from: Home            Anticipated d/c is to: SNF            Anticipated d/c date is: Once SNF and Outpatient dialysis has been arranged he can be discharged to SNF             Patient currently is not medically stable to  d/c. Consultants:see note  Procedures:see note  Culture/Microbiology    Component Value Date/Time   SDES  09/08/2020 2103    BLOOD LEFT HAND Performed at [facility], [place] [address], [place], [place] [address]    SPECREQUEST  09/08/2020 2103    BOTTLES DRAWN AEROBIC AND ANAEROBIC Blood Culture adequate volume Performed at [facility] [address], [place], [place] [address]    CULT  09/08/2020 2103    NO GROWTH 5 DAYS Performed at [facility] [address] [place], [place] [address]    REPTSTATUS 09/13/2020 FINAL 09/08/2020 2103    Other culture-see note  Medications: Scheduled Meds: . (feeding supplement) PROSource Plus  30 mL Oral QID  . amoxicillin-clavulanate  1 tablet Oral Daily  . apixaban  5 mg Oral BID  . Chlorhexidine Gluconate Cloth  6 each Topical Q0600  . doxycycline  100 mg Oral Q12H  . insulin aspart  0-15 Units Subcutaneous TID WC  . insulin aspart  0-5 Units Subcutaneous QHS  . midodrine  10 mg Oral TID WC  . multivitamin  1 tablet Oral QHS  . Ensure Max Protein  11 oz Oral BID  . traZODone  25 mg Oral QHS   Continuous Infusions:   Antimicrobials:  Anti-infectives (From admission, onward)   Start     Dose/Rate Route Frequency Ordered Stop   09/17/20 1000  amoxicillin-clavulanate (AUGMENTIN) 500-125 MG per tablet 500 mg        1 tablet Oral Daily 09/17/20 0839     09/14/20 2200  amoxicillin-clavulanate (AUGMENTIN) 500-125 MG per tablet 500 mg  Status:  Discontinued        1 tablet Oral 2 times daily 09/14/20 1524 09/17/20 0839   09/12/20 1000  amoxicillin-clavulanate (AUGMENTIN) 875-125 MG per tablet 1 tablet  Status:  Discontinued        1 tablet Oral Every 12 hours 09/12/20 0823 09/14/20 1524   09/12/20 1000  doxycycline (VIBRA-TABS) tablet 100 mg        100 mg Oral Every 12 hours 09/12/20 0823     09/10/20 2000  DAPTOmycin (CUBICIN) 740 mg in sodium chloride 0.9 % IVPB  Status:  Discontinued         740 mg 229.6 mL/hr over 30 Minutes Intravenous Daily 09/10/20 1230 09/12/20 0822   09/10/20 1200  metroNIDAZOLE (FLAGYL) IVPB 500 mg  Status:  Discontinued        500 mg 100 mL/hr over 60 Minutes Intravenous Every 8 hours 09/10/20 1050 09/12/20 0822   09/10/20 1132  vancomycin variable dose per unstable renal function (pharmacist dosing)  Status:  Discontinued         Does not apply See admin instructions 09/10/20 1132 09/10/20 1230   09/09/20 1700  vancomycin (VANCOREADY) IVPB 1500 mg/300 mL  Status:  Discontinued        1,500 mg 150 mL/hr over 120 Minutes Intravenous Every 24 hours 09/08/20 1956 09/10/20 1132   09/09/20 1000  ceFEPIme (MAXIPIME) 2 g in sodium chloride 0.9 % 100 mL IVPB  Status:  Discontinued        2 g 200 mL/hr over 30 Minutes Intravenous Every 12 hours 09/08/20 1947 09/12/20 0822   09/08/20 2200  ceFEPIme (MAXIPIME) 2 g in sodium chloride 0.9 % 100 mL IVPB        2 g 200 mL/hr over 30 Minutes Intravenous  Once 09/08/20 1754 09/08/20 2206   09/08/20 1800  vancomycin (VANCOCIN) IVPB 1000 mg/200 mL premix  Status:  Discontinued        1,000 mg 200 mL/hr over 60 Minutes Intravenous  Once 09/08/20 1754 09/08/20 1756   09/08/20 1630  vancomycin (VANCOREADY) IVPB 2000 mg/400 mL        2,000 mg 200 mL/hr over 120 Minutes Intravenous  Once 09/08/20 1551 09/08/20 1842   09/08/20 1600  piperacillin-tazobactam (ZOSYN) IVPB 3.375 g        3.375 g 100 mL/hr over 30 Minutes Intravenous  Once 09/08/20 1549 09/08/20 1645     Objective: Vitals: Today's Vitals   09/27/20 0500 09/27/20 0511 09/27/20 0716 09/27/20 0940  BP:  122/60  97/63  Pulse:  (!) 112  99  Resp:  16  17  Temp:  100 F (37.8 C)  98.1 F (36.7 C)  TempSrc:  Oral    SpO2:  93%  92%  Weight: 123 kg     Height:      PainSc:   0-No pain     Intake/Output Summary (Last 24 hours) at 09/27/2020 1254 Last data filed at 09/27/2020 1234 Gross per 24 hour  Intake 300 ml  Output 1200 ml  Net -900 ml    Filed Weights   09/26/20 1745 09/26/20 2121 09/27/20 0500  Weight: 123 kg  123 kg 123 kg   Weight change: -0.63 kg  Intake/Output from previous day: 11/12 0701 - 11/13 0700 In: 240 [P.O.:240] Out: 1200  Intake/Output this shift: Total I/O In: 300 [P.O.:300] Out: -   Examination: General exam: AAOx3, obese, NAD, weak appearing. HEENT:Oral mucosa moist, Ear/Nose WNL grossly, dentition normal. Respiratory system: bilaterally clear,no wheezing or crackles,no use of accessory muscle Cardiovascular system: S1 & S2 +, No JVD,. Gastrointestinal system: Abdomen soft, NT,ND, BS+ Nervous System:Alert, awake, moving extremities and grossly nonfocal Extremities: Bilateral edematous leg in UNA boot dressing- see pic below from 11/10 priro to current dressing Skin: No rashes,no icterus. MSK: Normal muscle bulk,tone, power          Data Reviewed: I have personally reviewed following labs and imaging studies CBC: Recent Labs  Lab 09/21/20 0321 09/21/20 0321 09/22/20 0301 09/22/20 0301 09/23/20 0340 09/23/20 0340 09/23/20 1127 09/24/20 0410 09/24/20 1345 09/25/20 0506 09/26/20 0144  WBC 8.2   < > 8.1  --  6.5  --   --  6.1  --  6.3 8.6  NEUTROABS 6.2  --  5.5  --  3.7  --   --   --   --   --   --   HGB 10.6*   < > 11.0*   < > 11.0*   < > 10.5* 10.5* 11.9* 9.8* 10.6*  HCT 34.0*   < > 36.4*   < > 36.5*   < > 35.0* 33.9* 35.0* 31.6* 34.0*  MCV 85.6   < > 88.1  --  88.6  --   --  89.0  --  85.4 85.6  PLT 299   < > 291  --  252  --   --  243  --  186 193   < > = values in this interval not displayed.   Basic Metabolic Panel: Recent Labs  Lab 09/21/20 0321 09/21/20 0321 09/22/20 0301 09/23/20 0340 09/24/20 1345 09/25/20 0506 09/26/20 0144  NA 135   < > 137 137 140 137 136  K 3.1*   < > 3.9 3.6 3.9 3.5 3.9  CL 101   < > 104 102 105 101 101  CO2 21*  --  19* 22  --  23 22  GLUCOSE 96   < > 116* 110* 113* 100* 140*  BUN 49*   < > 63* 52* 79* 39* 60*  CREATININE 6.05*    < > 6.93* 6.15* 7.90* 5.24* 6.79*  CALCIUM 8.5*  --  8.7* 8.3*  --  8.0* 8.3*  PHOS  --   --   --   --   --   --  6.9*   < > = values in this interval not displayed.   GFR: Estimated Creatinine Clearance: 12.9 mL/min (A) (by C-G formula based on SCr of 6.79 mg/dL (H)). Liver Function Tests: Recent Labs  Lab 09/26/20 0144  ALBUMIN 3.1*   No results for input(s): LIPASE, AMYLASE in the last 168 hours. No results for input(s): AMMONIA in the last 168 hours. Coagulation Profile: No results for input(s): INR, PROTIME in the last 168 hours. Cardiac Enzymes: No results for input(s): CKTOTAL, CKMB, CKMBINDEX, TROPONINI in the last 168 hours. BNP (last 3 results) No results for input(s): PROBNP in the last 8760 hours. HbA1C: No results for input(s): HGBA1C in the last 72 hours. CBG: Recent Labs  Lab 09/26/20 0648 09/26/20 1127 09/26/20 1817 09/26/20 2120 09/27/20 0632  GLUCAP 118* 117* 129* 111* 100*  Lipid Profile: No results for input(s): CHOL, HDL, LDLCALC, TRIG, CHOLHDL, LDLDIRECT in the last 72 hours. Thyroid Function Tests: No results for input(s): TSH, T4TOTAL, FREET4, T3FREE, THYROIDAB in the last 72 hours. Anemia Panel: Recent Labs    09/26/20 0144  FERRITIN 92  TIBC 316  IRON 37*   Sepsis Labs: No results for input(s): PROCALCITON, LATICACIDVEN in the last 168 hours.  Recent Results (from the past 240 hour(s))  SARS Coronavirus 2 by RT PCR (hospital order, performed in [facility] hospital lab) Nasopharyngeal Nasopharyngeal Swab     Status: Abnormal   Collection Time: 09/27/20 10:03 AM   Specimen: Nasopharyngeal Swab  Result Value Ref Range Status   SARS Coronavirus 2 POSITIVE (A) NEGATIVE Final    Comment: RESULT CALLED TO, READ BACK BY AND VERIFIED WITH: RN C APPLLEWHITE [id number] AT 1143 BY CM (NOTE) SARS-CoV-2 target nucleic acids are DETECTED  SARS-CoV-2 RNA is generally detectable in upper respiratory specimens  during the acute phase of infection.   Positive results are indicative  of the presence of the identified virus, but do not rule out bacterial infection or co-infection with other pathogens not detected by the test.  Clinical correlation with patient history and  other diagnostic information is necessary to determine patient infection status.  The expected result is negative.  Fact Sheet for Patients:   StrictlyIdeas.no   Fact Sheet for Healthcare Providers:   BankingDealers.co.za    This test is not yet approved or cleared by the [place] FDA and  has been authorized for detection and/or diagnosis of SARS-CoV-2 by FDA under an Emergency Use Authorization (EUA).  This EUA will remain in effect (meaning this  test can be used) for the duration of  the COVID-19 declaration under Section 564(b)(1) of the Act, 21 U.S.C. section 360-bbb-3(b)(1), unless the authorization is terminated or revoked sooner.  Performed at [facility], [place] [address] [place], [place] [address]      Radiology Studies: No results found.   LOS: 19 days   Bonnell Public, MD [facility]  09/27/2020, 12:54 PM

## 2020-09-27 NOTE — Progress Notes (Signed)
   09/27/20 0035  Assess: MEWS Score  BP 93/65  Pulse Rate (!) 134  Level of Consciousness Alert  SpO2 94 %  O2 Device Room Air  Patient Activity (if Appropriate) In bed  Assess: MEWS Score  MEWS Temp 0  MEWS Systolic 1  MEWS Pulse 3  MEWS RR 0  MEWS LOC 0  MEWS Score 4  MEWS Score Color Red  Assess: if the MEWS score is Yellow or Red  Were vital signs taken at a resting state? No  Focused Assessment Change from prior assessment (see assessment flowsheet)  Early Detection of Sepsis Score *See Row Information* Low  MEWS guidelines implemented *See Row Information* No, previously red, continue vital signs every 4 hours  Treat  Pain Scale 0-10  Pain Score 0  Escalate  MEWS: Escalate Red: discuss with charge nurse/RN and provider, consider discussing with RRT  Notify: Charge Nurse/RN  Name of Charge Nurse/RN Notified Nikki, RN  Date Charge Nurse/RN Notified 09/27/20  Time Charge Nurse/RN Notified 2300  Notify: Provider  Provider Name/Title Rathore, MD  Date Provider Notified 09/27/20  Time Provider Notified [phone]  Notification Type Page  Notification Reason Other (Comment) (tachycardia )  Response See new orders  Date of Provider Response 09/27/20  Time of Provider Response 0035  Notify: Rapid Response  Name of Rapid Response RN Notified No  Document  Patient Outcome Not stable and remains on department  Progress note created (see row info) Yes

## 2020-09-28 ENCOUNTER — Inpatient Hospital Stay (HOSPITAL_COMMUNITY): Payer: Medicare Other

## 2020-09-28 DIAGNOSIS — M869 Osteomyelitis, unspecified: Secondary | ICD-10-CM | POA: Diagnosis not present

## 2020-09-28 LAB — CBC
HCT: 35.3 % — ABNORMAL LOW (ref 39.0–52.0)
Hemoglobin: 10.9 g/dL — ABNORMAL LOW (ref 13.0–17.0)
MCH: 26.3 pg (ref 26.0–34.0)
MCHC: 30.9 g/dL (ref 30.0–36.0)
MCV: 85.3 fL (ref 80.0–100.0)
Platelets: 195 10*3/uL (ref 150–400)
RBC: 4.14 MIL/uL — ABNORMAL LOW (ref 4.22–5.81)
RDW: 16 % — ABNORMAL HIGH (ref 11.5–15.5)
WBC: 6.1 10*3/uL (ref 4.0–10.5)
nRBC: 0 % (ref 0.0–0.2)

## 2020-09-28 LAB — RENAL FUNCTION PANEL
Albumin: 2.9 g/dL — ABNORMAL LOW (ref 3.5–5.0)
Anion gap: 16 — ABNORMAL HIGH (ref 5–15)
BUN: 53 mg/dL — ABNORMAL HIGH (ref 8–23)
CO2: 21 mmol/L — ABNORMAL LOW (ref 22–32)
Calcium: 8.5 mg/dL — ABNORMAL LOW (ref 8.9–10.3)
Chloride: 101 mmol/L (ref 98–111)
Creatinine, Ser: 7.23 mg/dL — ABNORMAL HIGH (ref 0.61–1.24)
GFR, Estimated: 7 mL/min — ABNORMAL LOW (ref >60)
Glucose, Bld: 97 mg/dL (ref 70–99)
Phosphorus: 6.4 mg/dL — ABNORMAL HIGH (ref 2.5–4.6)
Potassium: 3.5 mmol/L (ref 3.5–5.1)
Sodium: 138 mmol/L (ref 135–145)

## 2020-09-28 LAB — GLUCOSE, CAPILLARY
Glucose-Capillary: 92 mg/dL (ref 70–99)
Glucose-Capillary: 110 mg/dL — ABNORMAL HIGH (ref 70–99)
Glucose-Capillary: 101 mg/dL — ABNORMAL HIGH (ref 70–99)
Glucose-Capillary: 120 mg/dL — ABNORMAL HIGH (ref 70–99)

## 2020-09-28 LAB — PROCALCITONIN: Procalcitonin: 1.81 ng/mL

## 2020-09-28 LAB — C-REACTIVE PROTEIN: CRP: 8.4 mg/dL — ABNORMAL HIGH (ref <1.0)

## 2020-09-28 LAB — MAGNESIUM: Magnesium: 1.8 mg/dL (ref 1.7–2.4)

## 2020-09-28 LAB — D-DIMER, QUANTITATIVE: D-Dimer, Quant: 3.02 ug/mL-FEU — ABNORMAL HIGH (ref 0.00–0.50)

## 2020-09-28 LAB — BRAIN NATRIURETIC PEPTIDE: B Natriuretic Peptide: 1399.4 pg/mL — ABNORMAL HIGH (ref 0.0–100.0)

## 2020-09-28 LAB — MRSA PCR SCREENING: MRSA by PCR: NEGATIVE

## 2020-09-28 MED ORDER — ALTEPLASE 2 MG IJ SOLR: 2.0000 mg | Freq: Once | INTRAMUSCULAR | Status: DC | PRN

## 2020-09-28 MED ORDER — AMIODARONE LOAD VIA INFUSION: 150.0000 mg | Freq: Once | INTRAVENOUS | Status: DC

## 2020-09-28 MED ORDER — DIPHENHYDRAMINE HCL 50 MG/ML IJ SOLN: 50.0000 mg | Freq: Once | INTRAMUSCULAR | Status: DC | PRN

## 2020-09-28 MED ORDER — LIDOCAINE HCL (PF) 1 % IJ SOLN: 5.0000 mL | INTRAMUSCULAR | Status: DC | PRN

## 2020-09-28 MED ORDER — LIDOCAINE-PRILOCAINE 2.5-2.5 % EX CREA: 1.0000 "application " | TOPICAL_CREAM | CUTANEOUS | Status: DC | PRN

## 2020-09-28 MED ORDER — SODIUM CHLORIDE 0.9 % IV SOLN: 100.0000 mL | INTRAVENOUS | Status: DC | PRN

## 2020-09-28 MED ORDER — MAGNESIUM SULFATE IN D5W 1-5 GM/100ML-% IV SOLN
1.0000 g | Freq: Once | INTRAVENOUS | Status: DC
  Filled 2020-09-28: qty 100

## 2020-09-28 MED ORDER — FAMOTIDINE IN NACL 20-0.9 MG/50ML-% IV SOLN
20.0000 mg | Freq: Once | INTRAVENOUS | Status: DC | PRN
  Filled 2020-09-28: qty 50

## 2020-09-28 MED ORDER — AMIODARONE IV BOLUS ONLY 150 MG/100ML
150.0000 mg | Freq: Once | INTRAVENOUS | Status: AC
  Administered 2020-09-28: 150 mg via INTRAVENOUS
  Filled 2020-09-28: qty 100

## 2020-09-28 MED ORDER — EPINEPHRINE 0.3 MG/0.3ML IJ SOAJ
0.3000 mg | Freq: Once | INTRAMUSCULAR | Status: DC | PRN
  Filled 2020-09-28: qty 0.6

## 2020-09-28 MED ORDER — SODIUM CHLORIDE 0.9 % IV SOLN
Freq: Once | INTRAVENOUS | Status: AC
  Filled 2020-09-28: qty 20

## 2020-09-28 MED ORDER — SODIUM CHLORIDE 0.9 % IV SOLN: INTRAVENOUS | Status: DC | PRN

## 2020-09-28 MED ORDER — HEPARIN SODIUM (PORCINE) 1000 UNIT/ML DIALYSIS: 1000.0000 [IU] | INTRAMUSCULAR | Status: DC | PRN

## 2020-09-28 MED ORDER — ALBUTEROL SULFATE HFA 108 (90 BASE) MCG/ACT IN AERS
2.0000 | INHALATION_SPRAY | Freq: Once | RESPIRATORY_TRACT | Status: DC | PRN
  Filled 2020-09-28: qty 6.7

## 2020-09-28 MED ORDER — MAGNESIUM SULFATE IN D5W 1-5 GM/100ML-% IV SOLN
1.0000 g | Freq: Once | INTRAVENOUS | Status: AC
  Administered 2020-09-28: 1 g via INTRAVENOUS
  Filled 2020-09-28: qty 100

## 2020-09-28 MED ORDER — PENTAFLUOROPROP-TETRAFLUOROETH EX AERO: 1.0000 "application " | INHALATION_SPRAY | CUTANEOUS | Status: DC | PRN

## 2020-09-28 MED ORDER — METHYLPREDNISOLONE SODIUM SUCC 125 MG IJ SOLR: 125.0000 mg | Freq: Once | INTRAMUSCULAR | Status: DC | PRN

## 2020-09-28 NOTE — Progress Notes (Signed)
Just came from HD dept, total UF=2L as recorded in intake and output record, v/s taken initial SBP=182 mmhg, Temp=102.6 via axilla, pt complaining of shivering, blanket applied, medicated for BP

## 2020-09-28 NOTE — Progress Notes (Signed)
Patient transferred to 5W from 5MW. Patient is alert and oriented x4. Vitals are stable. Patient has various pressure injuries on lower extremities. They are wrapped with gauze and ACE wraps. He also has some pitting edema in his lower extremities. His bottom, in between the gluteal folds is also pink/ purple but blanchable. Patient belongings (cell phone, charger, and gray ortho boot) at bedside. Call bell and bedside table are within reach. Patient told to call for assistance and not to get up on his own.   Patient also had a family member show up on the unit and become aggressive with security. She was escorted off of the unit.

## 2020-09-28 NOTE — Progress Notes (Addendum)
PROGRESS NOTE    [place].  TDD:[id number] DOB: 19-May-1951 DOA: 09/08/2020 PCP: Aura Dials, MD   Chief Complaint  Patient presents with  . Wound Infection    Brief Narrative:  69 year old male with W2BJ, chronic systolic and diastolic CHF, PAF supposed to be on Eliquis, HTN, morbid obesity brought to ED 09/08/2020 after he was found down in his house.  Reportedly patient was followed.  Protective services, has very unsanitary living condition at home and was found down in the bathroom during welfare check. Per report: Patient reports falling while standing up from bathroom without LOC, resulting in fall onto R chest and resultant R lower chest pain. Patient was found down and brought into ED. Due to his poor living conditions his daughter was involved in his care, he is Noncompliant in general and very self-neglected with denial component.   He was found to have necrotic foot infections and underwent xray of right foot, which revealed subcortical lucency involving 5th digit concerning for OM.  Had massive lower extremity edema and apparently had stopped taking torsemide several weeks ago as he did not think he needed it.  Has been seen by general surgery, psychiatry along with Dr. Sharol Given orthopedic surgery, amputation of the right fifth digit was offered but patient declined, he is currently being treated with Augmentin and doxycycline with plans of being on chronic doxycycline post discharge and follow-up with Dr. Sharol Given.  Was started on diuretics this admission unfortunately his renal function continued to decline, he was seen by nephrology and now has progressed to ESRD and is HD dependent, underwent right IJ tunneled hemodialysis catheter placement and left radiocephalic AV fistula creation by Dr. Donnetta Hutching  He needed a Covid PCR test for SNF placement & also spiked a low grade fever on early 09/27/2020 after which he was tested for COVID-19 and he was positive for the same with some  x-ray evidence of pneumonia, of note patient was unvaccinated for COVID-19 till he was hospitalized and got his first vaccine shot on 09/16/2020 with not enough time to mount immunity.  He was transferred to my care on 09/28/2020 on day 20 of his hospital stay.    Subjective:  Patient in bed, appears comfortable, denies any headache, no fever, no chest pain or pressure, no shortness of breath , no abdominal pain. No focal weakness.   Assessment & Plan:  SNF placement related COVID-19 test along with low-grade fever early morning 09/27/2020 showing positive COVID-19 infection diagnosed in the hospital on 09/27/2020, x-ray showing interstitial pattern as well.  Patient was not previously vaccinated but got his Covid vaccine while in this hospital however for short was given on 09/16/2020.  Still does not have full immunity for it.  Patient is currently asymptomatic from pulmonary standpoint, he has consented for Covid antibody infusion, daughter informed as well.  If worse he will get steroids and Remdesivir.  Unfortunately due to active lower extremity infections he is not a candidate for Actemra or Baricitinib.  This plan was clearly given to patient and his daughter Oswaldo Milian, daughter [name] on 09/28/2020 requested that patient be given ivermectin, she was told that this is not the hospital COVID-19 protocol and we cannot use ivermectin as we do not have any positive experience with it at this facility.  She also informed me that she is a Therapist, sports and that Covid Vaccines are known to give Covid 19 infections, also some studies are showing that remdesivir is causing a lot of harm to  lungs, also informed that such studies have not been reviewed by our COVID-19 team.    AKI on CKD stage III with baseline creatinine 1.3-1.9: Seen by Nephrology - etiology multifactorial including cardiorenal syndrome, ATN.  Now on dialysis since 11/3 and tolerating well, at times episode of hypotension and is started on midodrine.   S/P Rtt IJ tunneled HD  catheter placement and left radiocephalic AV fistula creation by Dr. Donnetta Hutching 11/10.  Continue dialysis per Renal.   Acute on chronic combined systolic and diastolic CHF: LVEF 11-94% in LVEF, previously back in March EF was 40-45%: TTE shows lvef 30-35%, severe concentric LVH, global hypokinesia.  Now dialysis dependent and volume will be managed in dialysis.    Hypotension: BP soft, continue midodrine.  Continue metoprolol w/ holding parameters for a fib-May need to discontinue again since BP soft September 27, 2020: Stable.  Likely multifactorial, including low EF.  Extensive bilateral lower extremity edema wound on right foot,Osteomyelitis of fifth toe of right foot per x-ray, ABI indeterminate due to lower extremity edema.  Seen by Dr. Sharol Given offered amputation but patient refused.  Family agreed with patient decision not to pursue amputation.  Plan is to continue aggressive wound care, outpatient doxycycline long-term, follow-up with Dr. Sharol Given as outpatient, continue wound care, patient clearly understands the risk of not undergoing surgical amputation and that it can make him septic and can cause death and disability.  Chronic lymphedema: Continue Una boot, close wound care follow-up.   Falls at home/neuropathy: Continue PT OT, will need skilled nursing facility placement.  Hypokalemia resolved.  Severe sepsis POA: Sepsis physiology has resolved.  Acute encephalopathy, uremic/septic on presentation per chArt. At this time mental status is stable.    PAF with RDE:[id number] with meds.continue metoprolol blood pressure tolerates otherwise can discontinue.  Got the procedure done yesterday if okay with nephrology will switch to Eliquis- pharmacy to adjust dose 2/2 AKI.  Hematuria after placing foley catheter suspect catheter related trauma: off Foley 11/9. Patient denies any more hematuria.  Morbid obesity with BMI 43>41, he will benefit with weight loss healthy lifestyle  outpatient sleep apnea evaluation but I am not sure how much compliant of feasible it is for him.   Type 2 diabetes mellitus: A1c uncontrolled 9.6 10/25.  Blood sugar is controlled on sliding scale.  Blood sugar stable on sliding scale insulin Recent Labs  Lab 09/27/20 0632 09/27/20 1137 09/27/20 1645 09/27/20 2139 09/28/20 0747  GLUCAP 100* 110* 92 106* 92         Nutrition: Diet Order            Diet renal with fluid restriction Fluid restriction: 1200 mL Fluid; Room service appropriate? Yes; Fluid consistency: Thin  Diet effective now                 Nutrition Problem: Increased nutrient needs Etiology: wound healing, acute illness Signs/Symptoms: estimated needs Interventions: MVI, Other (Comment) (Prosource Plus)  Body mass index is 39.3 kg/m.  Pressure Ulcer: Pressure Injury 09/08/20 Heel Right Stage 3 -  Full thickness tissue loss. Subcutaneous fat may be visible but bone, tendon or muscle are NOT exposed. 25% red, 75% slouogh, strong odor, mod brown (Active)  09/08/20   Location: Heel  Location Orientation: Right  Staging: Stage 3 -  Full thickness tissue loss. Subcutaneous fat may be visible but bone, tendon or muscle are NOT exposed.  Wound Description (Comments): 25% red, 75% slouogh, strong odor, mod brown  Present on Admission: Yes  Pressure Injury 09/17/20 Buttocks Right;Left Deep Tissue Pressure Injury - Purple or maroon localized area of discolored intact skin or blood-filled blister due to damage of underlying soft tissue from pressure and/or shear. (Active)  09/17/20 1805  Location: Buttocks  Location Orientation: Right;Left  Staging: Deep Tissue Pressure Injury - Purple or maroon localized area of discolored intact skin or blood-filled blister due to damage of underlying soft tissue from pressure and/or shear.  Wound Description (Comments):   Present on Admission:      DVT prophylaxis: Eliquis  Code Status:  Code Status: Full Code    Family Communication: plan of care discussed with patient.  Daughters have been updated multiple times.  Family meeting completed 09/23/2020 at bedside along with nephrology and palliative care be RN.    Updated daughter 09/28/2020.  Status IR:SWNIOEVOJ Remains inpatient appropriate because:IV treatments appropriate due to intensity of illness or inability to take PO and Inpatient level of care appropriate due to severity of illness  Dispo:The patient is from: Home            Anticipated d/c is to: SNF            Anticipated d/c date is: Once SNF and Outpatient dialysis has been arranged & he is stable from Covid standpoint.             Patient currently is not medically stable to d/c.   Consultants:see note  Procedures:see note  Culture/Microbiology    Component Value Date/Time   SDES  09/08/2020 2103    BLOOD LEFT HAND Performed at [facility], [place] [address] [place], [place] [address]    SPECREQUEST  09/08/2020 2103    BOTTLES DRAWN AEROBIC AND ANAEROBIC Blood Culture adequate volume Performed at [facility] [address], [place], [place] [address]    CULT  09/08/2020 2103    NO GROWTH 5 DAYS Performed at [facility] [address] [place], [place] [address]    REPTSTATUS 09/13/2020 FINAL 09/08/2020 2103    Other culture-see note  Medications:  Scheduled Meds: . (feeding supplement) PROSource Plus  30 mL Oral QID  . amoxicillin-clavulanate  1 tablet Oral Daily  . apixaban  5 mg Oral BID  . Chlorhexidine Gluconate Cloth  6 each Topical Q0600  . doxycycline  100 mg Oral Q12H  . insulin aspart  0-15 Units Subcutaneous TID WC  . insulin aspart  0-5 Units Subcutaneous QHS  . midodrine  10 mg Oral TID WC  . multivitamin  1 tablet Oral QHS  . Ensure Max Protein  11 oz Oral BID  . traZODone  25 mg Oral QHS   Continuous Infusions: . sodium chloride    . sodium chloride    . magnesium sulfate bolus IVPB       Antimicrobials:  Anti-infectives (From admission, onward)   Start     Dose/Rate Route Frequency Ordered Stop   09/17/20 1000  amoxicillin-clavulanate (AUGMENTIN) 500-125 MG per tablet 500 mg        1 tablet Oral Daily 09/17/20 0839     09/14/20 2200  amoxicillin-clavulanate (AUGMENTIN) 500-125 MG per tablet 500 mg  Status:  Discontinued        1 tablet Oral 2 times daily 09/14/20 1524 09/17/20 0839   09/12/20 1000  amoxicillin-clavulanate (AUGMENTIN) 875-125 MG per tablet 1 tablet  Status:  Discontinued        1 tablet Oral Every 12 hours 09/12/20 0823 09/14/20 1524   09/12/20  1000  doxycycline (VIBRA-TABS) tablet 100 mg        100 mg Oral Every 12 hours 09/12/20 0823     09/10/20 2000  DAPTOmycin (CUBICIN) 740 mg in sodium chloride 0.9 % IVPB  Status:  Discontinued        740 mg 229.6 mL/hr over 30 Minutes Intravenous Daily 09/10/20 1230 09/12/20 0822   09/10/20 1200  metroNIDAZOLE (FLAGYL) IVPB 500 mg  Status:  Discontinued        500 mg 100 mL/hr over 60 Minutes Intravenous Every 8 hours 09/10/20 1050 09/12/20 0822   09/10/20 1132  vancomycin variable dose per unstable renal function (pharmacist dosing)  Status:  Discontinued         Does not apply See admin instructions 09/10/20 1132 09/10/20 1230   09/09/20 1700  vancomycin (VANCOREADY) IVPB 1500 mg/300 mL  Status:  Discontinued        1,500 mg 150 mL/hr over 120 Minutes Intravenous Every 24 hours 09/08/20 1956 09/10/20 1132   09/09/20 1000  ceFEPIme (MAXIPIME) 2 g in sodium chloride 0.9 % 100 mL IVPB  Status:  Discontinued        2 g 200 mL/hr over 30 Minutes Intravenous Every 12 hours 09/08/20 1947 09/12/20 0822   09/08/20 2200  ceFEPIme (MAXIPIME) 2 g in sodium chloride 0.9 % 100 mL IVPB        2 g 200 mL/hr over 30 Minutes Intravenous  Once 09/08/20 1754 09/08/20 2206   09/08/20 1800  vancomycin (VANCOCIN) IVPB 1000 mg/200 mL premix  Status:  Discontinued        1,000 mg 200 mL/hr over 60 Minutes Intravenous  Once  09/08/20 1754 09/08/20 1756   09/08/20 1630  vancomycin (VANCOREADY) IVPB 2000 mg/400 mL        2,000 mg 200 mL/hr over 120 Minutes Intravenous  Once 09/08/20 1551 09/08/20 1842   09/08/20 1600  piperacillin-tazobactam (ZOSYN) IVPB 3.375 g        3.375 g 100 mL/hr over 30 Minutes Intravenous  Once 09/08/20 1549 09/08/20 1645     Objective: Vitals: Today's Vitals   09/28/20 0648 09/28/20 0700 09/28/20 0744 09/28/20 0903  BP: 93/67 100/70 (!) 89/46 (!) 83/57  Pulse: (!) 138 (!) 126 (!) 105 (!) 115  Resp:   20 19  Temp: 98.3 F (36.8 C)  98 F (36.7 C) 98 F (36.7 C)  TempSrc:   Oral Oral  SpO2: 98% 99% 98% 99%  Weight:      Height:      PainSc: 0-No pain       Intake/Output Summary (Last 24 hours) at 09/28/2020 0934 Last data filed at 09/28/2020 0900 Gross per 24 hour  Intake 560 ml  Output 2000 ml  Net -1440 ml   Filed Weights   09/26/20 2121 09/27/20 0500 09/28/20 0500  Weight: 123 kg 123 kg 120.7 kg   Weight change: -3.7 kg  Intake/Output from previous day: 11/13 0701 - 11/14 0700 In: 500 [P.O.:500] Out: 2000  Intake/Output this shift: Total I/O In: 240 [P.O.:240] Out: -   Examination:  Awake Alert, No new F.N deficits, Normal affect [place].AT,PERRAL Supple Neck,No JVD, No cervical lymphadenopathy appriciated.  Symmetrical Chest wall movement, Good air movement bilaterally, CTAB RRR,No Gallops, Rubs or new Murmurs, No Parasternal Heave +ve B.Sounds, Abd Soft, No tenderness, No organomegaly appriciated, No rebound - guarding or rigidity. No Cyanosis.  Leg wound pictures are below -  Data Reviewed: I have personally reviewed following labs and imaging studies -  CBC:  Recent Labs  Lab 09/22/20 0301 09/22/20 0301 09/23/20 0340 09/23/20 1127 09/24/20 0410 09/24/20 1345 09/25/20 0506 09/26/20 0144 09/28/20 0545  WBC 8.1   < > 6.5  --  6.1  --  6.3 8.6 6.1  NEUTROABS 5.5  --  3.7  --   --   --   --   --   --   HGB 11.0*   < >  11.0*   < > 10.5* 11.9* 9.8* 10.6* 10.9*  HCT 36.4*   < > 36.5*   < > 33.9* 35.0* 31.6* 34.0* 35.3*  MCV 88.1   < > 88.6  --  89.0  --  85.4 85.6 85.3  PLT 291   < > 252  --  243  --  186 193 195   < > = values in this interval not displayed.   Basic Metabolic Panel: Recent Labs  Lab 09/22/20 0301 09/22/20 0301 09/23/20 0340 09/24/20 1345 09/25/20 0506 09/26/20 0144 09/28/20 0545 09/28/20 0811  NA 137   < > 137 140 137 136 138  --   K 3.9   < > 3.6 3.9 3.5 3.9 3.5  --   CL 104   < > 102 105 101 101 101  --   CO2 19*  --  22  --  23 22 21*  --   GLUCOSE 116*   < > 110* 113* 100* 140* 97  --   BUN 63*   < > 52* 79* 39* 60* 53*  --   CREATININE 6.93*   < > 6.15* 7.90* 5.24* 6.79* 7.23*  --   CALCIUM 8.7*  --  8.3*  --  8.0* 8.3* 8.5*  --   MG  --   --   --   --   --   --   --  1.8  PHOS  --   --   --   --   --  6.9* 6.4*  --    < > = values in this interval not displayed.   GFR: Estimated Creatinine Clearance: 12 mL/min (A) (by C-G formula based on SCr of 7.23 mg/dL (H)). Liver Function Tests: Recent Labs  Lab 09/26/20 0144 09/28/20 0545  ALBUMIN 3.1* 2.9*   No results for input(s): LIPASE, AMYLASE in the last 168 hours. No results for input(s): AMMONIA in the last 168 hours. Coagulation Profile: No results for input(s): INR, PROTIME in the last 168 hours. Cardiac Enzymes: No results for input(s): CKTOTAL, CKMB, CKMBINDEX, TROPONINI in the last 168 hours. BNP (last 3 results) No results for input(s): PROBNP in the last 8760 hours. HbA1C: No results for input(s): HGBA1C in the last 72 hours. CBG: Recent Labs  Lab 09/27/20 0632 09/27/20 1137 09/27/20 1645 09/27/20 2139 09/28/20 0747  GLUCAP 100* 110* 92 106* 92   Lipid Profile: No results for input(s): CHOL, HDL, LDLCALC, TRIG, CHOLHDL, LDLDIRECT in the last 72 hours. Thyroid Function Tests: No results for input(s): TSH, T4TOTAL, FREET4, T3FREE, THYROIDAB in the last 72 hours. Anemia Panel: Recent Labs     09/26/20 0144  FERRITIN 92  TIBC 316  IRON 37*   Sepsis Labs: No results for input(s): PROCALCITON, LATICACIDVEN in the last 168 hours.  Recent Results (from the past 240 hour(s))  SARS Coronavirus 2 by RT PCR (hospital order, performed in [facility] hospital lab) Nasopharyngeal Nasopharyngeal Swab     Status: Abnormal  Collection Time: 09/27/20 10:03 AM   Specimen: Nasopharyngeal Swab  Result Value Ref Range Status   SARS Coronavirus 2 POSITIVE (A) NEGATIVE Final    Comment: RESULT CALLED TO, READ BACK BY AND VERIFIED WITH: RN C APPLLEWHITE [id number] AT 2778 BY CM (NOTE) SARS-CoV-2 target nucleic acids are DETECTED  SARS-CoV-2 RNA is generally detectable in upper respiratory specimens  during the acute phase of infection.  Positive results are indicative  of the presence of the identified virus, but do not rule out bacterial infection or co-infection with other pathogens not detected by the test.  Clinical correlation with patient history and  other diagnostic information is necessary to determine patient infection status.  The expected result is negative.  Fact Sheet for Patients:   StrictlyIdeas.no   Fact Sheet for Healthcare Providers:   BankingDealers.co.za    This test is not yet approved or cleared by the [place] FDA and  has been authorized for detection and/or diagnosis of SARS-CoV-2 by FDA under an Emergency Use Authorization (EUA).  This EUA will remain in effect (meaning this  test can be used) for the duration of  the COVID-19 declaration under Section 564(b)(1) of the Act, 21 U.S.C. section 360-bbb-3(b)(1), unless the authorization is terminated or revoked sooner.  Performed at [facility], [place] [address], [place], [place] [address]   Resp Panel by RT PCR (RSV, Flu A&B, Covid) - Nasopharyngeal Swab     Status: Abnormal   Collection Time: 09/27/20 12:29 PM   Specimen: Nasopharyngeal Swab  Result  Value Ref Range Status   SARS Coronavirus 2 by RT PCR POSITIVE (A) NEGATIVE Final    Comment: RESULT CALLED TO, READ BACK BY AND VERIFIED WITH: RN R AUGUSTUS [id number] AT 1404 BY CM (NOTE) SARS-CoV-2 target nucleic acids are DETECTED.  SARS-CoV-2 RNA is generally detectable in upper respiratory specimens  during the acute phase of infection. Positive results are indicative of the presence of the identified virus, but do not rule out bacterial infection or co-infection with other pathogens not detected by the test. Clinical correlation with patient history and other diagnostic information is necessary to determine patient infection status. The expected result is Negative.  Fact Sheet for Patients:  PinkCheek.be  Fact Sheet for Healthcare Providers: GravelBags.it  This test is not yet approved or cleared by the [place] FDA and  has been authorized for detection and/or diagnosis of SARS-CoV-2 by FDA under an Emergency Use Authorization (EUA).  This EUA will remain in effect (meaning this test can be  used) for the duration of  the COVID-19 declaration under Section 564(b)(1) of the Act, 21 U.S.C. section 360bbb-3(b)(1), unless the authorization is terminated or revoked sooner.      Influenza A by PCR NEGATIVE NEGATIVE Final   Influenza B by PCR NEGATIVE NEGATIVE Final    Comment: (NOTE) The Xpert Xpress SARS-CoV-2/FLU/RSV assay is intended as an aid in  the diagnosis of influenza from Nasopharyngeal swab specimens and  should not be used as a sole basis for treatment. Nasal washings and  aspirates are unacceptable for Xpert Xpress SARS-CoV-2/FLU/RSV  testing.  Fact Sheet for Patients: PinkCheek.be  Fact Sheet for Healthcare Providers: GravelBags.it  This test is not yet approved or cleared by the [place] FDA and  has been authorized for detection  and/or diagnosis of SARS-CoV-2 by  FDA under an Emergency Use Authorization (EUA). This EUA will remain  in effect (meaning this test can be used) for the duration of the  Covid-19 declaration under Section 564(b)(1) of the Act, 21  U.S.C. section 360bbb-3(b)(1), unless the authorization is  terminated or revoked.    Respiratory Syncytial Virus by PCR NEGATIVE NEGATIVE Final    Comment: (NOTE) Fact Sheet for Patients: PinkCheek.be  Fact Sheet for Healthcare Providers: GravelBags.it  This test is not yet approved or cleared by the [place] FDA and  has been authorized for detection and/or diagnosis of SARS-CoV-2 by  FDA under an Emergency Use Authorization (EUA). This EUA will remain  in effect (meaning this test can be used) for the duration of the  COVID-19 declaration under Section 564(b)(1) of the Act, 21 U.S.C.  section 360bbb-3(b)(1), unless the authorization is terminated or  revoked. Performed at [facility], [place] [address], [place], [place] [address]      Radiology Studies:  DG Chest Port 1 View  Result Date: 09/28/2020 CLINICAL DATA:  Shortness of breath.  COVID-19 positive EXAM: PORTABLE CHEST 1 VIEW COMPARISON:  September 24, 2020 FINDINGS: Central catheter tip is in the right atrium without pneumothorax. There is patchy airspace opacity in the right mid lung and lung base regions, slightly increased from most recent study. Heart size and pulmonary vascularity are normal. No adenopathy. There is aortic atherosclerosis. Evidence of prior surgery or trauma involving each lateral clavicle noted. IMPRESSION: Patchy airspace opacity in the right mid lung and both base regions, likely due to atypical organism pneumonia. No consolidation. Increased opacity compared to most recent chest radiograph. Stable cardiac silhouette. Central catheter tip in right atrium without pneumothorax. Aortic Atherosclerosis  (ICD10-I70.0). Electronically Signed   By: Lowella Grip III M.D.   On: 09/28/2020 08:45     LOS: 20 days   Lala Lund, MD [facility]  09/28/2020, 9:34 AM

## 2020-09-28 NOTE — Progress Notes (Signed)
Page Dr Marlowe Sax, informed about the latest BP and the management made

## 2020-09-28 NOTE — Progress Notes (Signed)
Dr Marlowe Sax called up updated pt condition and latestBP,after metoprolo was given SBP=88 mmhg, HR=120-130 bpm, Dr Marlowe Sax advised to re checked BP manually-

## 2020-09-28 NOTE — Progress Notes (Signed)
Pharmacy COVID-19 Monoclonal Antibody Screening  [name] was identified as being not hospitalized with symptoms from Covid-19 on admission but an incidental positive PCR has been documented. The patient may qualify for the use of monoclonal antibodies (mAB) for COVID-19 viral infection to prevent worsening symptoms stemming from Covid-19 infection.  The patient was identified based on a positive COVID-19 PCR and not requiring the use of supplemental oxygen at this time.  This patient meets the FDA criteria for Emergency Use Authorization of casirivimab/imdevimab.   Has a (+) direct SARS-CoV-2 viral test result   Is NOT hospitalized due to COVID-19   Is within 10 days of symptom onset   Has at least one of the high risk factor(s) for progression to severe COVID-19 and/or hospitalization as defined in EUA.   Specific high risk criteria: Age > 23, obesity, cardiovascular disease  Additionally:  The patient has not had a positive COVID-19 PCR in the last 90 days.  The patient is partially vaccinated against COVID-19.  Since the patient is :  Is partially or fully vaccinated for COVID-19, is asymptomatic with a cycle time of < 32, and meets high risk criteria, the patient is eligible for MAB administration  This eligibility and indication for treatment was discussed with the patient's physician: Dr. Candiss Norse  Plan:  Based on the above discussion, it was decided that the patient will receive one dose of mAb combination casirivimab/imdevimab.  [name], PharmD, [facility] Clinical Pharmacist Please see AMION for all Pharmacists' Contact Phone Numbers 09/28/2020, 1:27 PM

## 2020-09-28 NOTE — Progress Notes (Signed)
[facility] NEPHROLOGY PROGRESS NOTE  Assessment/ Plan: Pt is a 69 y.o. yo male with history of CHF, DM, A. fib, hypertension found down at home during welfare check currently followed by APS.  In ED he had A. fib with RVR, diabetic foot infection and acute kidney injury.  #Acute kidney injury on CKD stage IIIb: Baseline creatinine level 1.3-1.9.  AKI multifactorial etiology including cardiorenal syndrome, ATN.  Started dialysis on 11/3 and has been tolerating well.  Tried high-dose IV diuretics without response.   No sign of renal recovery.  We had a meeting in the presence of palliative care on 11/9 with the patient and his daughter.  They agreed to dialysis and permanent access. RIJ TDC and left radiocephalic AV fistula created by Dr. Donnetta Hutching on 11/10. Last HD yesterday with 1.2 L UF.  He has OP HD unit arranged at [facility] clinic TTS schedule to arrive at 1225 PM. Status post HD on 11/13 with 2 L UF, next HD on Tuesday.    #Covid positive, fever: The Covid was positive while checked for placement.  Is now febrile and hypotensive.  Plan for Covid antibody infusion per primary team.  #CHF with systolic and diastolic dysfunction: Volume overload causing cardiorenal syndrome.  Did not respond with IV diuretics therefore now volume management with dialysis. Discontinued diuretics.  #Hypertension: On metoprolol for A. fib.  Hypotensive today therefore continue midodrine 3 times a day.  Monitor BP.  He is asymptomatic.  #Sepsis with osteomyelitis of fifth toe on the right foot: On antibiotics per primary team.  #Anemia due to sepsis, AKI: Hemoglobin at goal, iron saturation is low however I will hold IV iron because of infection and currently on antibiotics.  #Metabolic acidosis: Managed with dialysis now.  #Bleeding from tunnel catheter site: The temporary catheter is out.  No bleeding.  #A. fib with RVR: No further bleeding, on BB and Eliquis.   Subjective: Seen and  examined at bedside.  He has now moved to 15 W. because of Covid positive.  He is febrile and hypotensive.  Sitting on chair.  Denies nausea vomiting chest pain shortness of breath.  Had dialysis yesterday. Objective Vital signs in last 24 hours: Vitals:   09/28/20 0700 09/28/20 0744 09/28/20 0903 09/28/20 0947  BP: 100/70 (!) 89/46 (!) 83/57 (!) 86/60  Pulse: (!) 126 (!) 105 (!) 115 (!) 103  Resp:  20 19 18   Temp:  98 F (36.7 C) 98 F (36.7 C) 98.2 F (36.8 C)  TempSrc:  Oral Oral Oral  SpO2: 99% 98% 99% 99%  Weight:      Height:       Weight change: -3.7 kg  Intake/Output Summary (Last 24 hours) at 09/28/2020 1112 Last data filed at 09/28/2020 0900 Gross per 24 hour  Intake 560 ml  Output 2000 ml  Net -1440 ml       Labs: Basic Metabolic Panel: Recent Labs  Lab 09/25/20 0506 09/26/20 0144 09/28/20 0545  NA 137 136 138  K 3.5 3.9 3.5  CL 101 101 101  CO2 23 22 21*  GLUCOSE 100* 140* 97  BUN 39* 60* 53*  CREATININE 5.24* 6.79* 7.23*  CALCIUM 8.0* 8.3* 8.5*  PHOS  --  6.9* 6.4*   Liver Function Tests: Recent Labs  Lab 09/26/20 0144 09/28/20 0545  ALBUMIN 3.1* 2.9*   No results for input(s): LIPASE, AMYLASE in the last 168 hours. No results for input(s): AMMONIA in the last 168 hours. CBC: Recent  Labs  Lab 09/22/20 0301 09/22/20 0301 09/23/20 0340 09/23/20 1127 09/24/20 0410 09/24/20 1345 09/25/20 0506 09/26/20 0144 09/28/20 0545  WBC 8.1   < > 6.5  --  6.1  --  6.3 8.6 6.1  NEUTROABS 5.5  --  3.7  --   --   --   --   --   --   HGB 11.0*   < > 11.0*   < > 10.5*   < > 9.8* 10.6* 10.9*  HCT 36.4*   < > 36.5*   < > 33.9*   < > 31.6* 34.0* 35.3*  MCV 88.1   < > 88.6  --  89.0  --  85.4 85.6 85.3  PLT 291   < > 252  --  243  --  186 193 195   < > = values in this interval not displayed.   Cardiac Enzymes: No results for input(s): CKTOTAL, CKMB, CKMBINDEX, TROPONINI in the last 168 hours. CBG: Recent Labs  Lab 09/27/20 0632 09/27/20 1137  09/27/20 1645 09/27/20 2139 09/28/20 0747  GLUCAP 100* 110* 92 106* 92    Iron Studies:  Recent Labs    09/26/20 0144  IRON 37*  TIBC 316  FERRITIN 92   Studies/Results: DG Chest Port 1 View  Result Date: 09/28/2020 CLINICAL DATA:  Shortness of breath.  COVID-19 positive EXAM: PORTABLE CHEST 1 VIEW COMPARISON:  September 24, 2020 FINDINGS: Central catheter tip is in the right atrium without pneumothorax. There is patchy airspace opacity in the right mid lung and lung base regions, slightly increased from most recent study. Heart size and pulmonary vascularity are normal. No adenopathy. There is aortic atherosclerosis. Evidence of prior surgery or trauma involving each lateral clavicle noted. IMPRESSION: Patchy airspace opacity in the right mid lung and both base regions, likely due to atypical organism pneumonia. No consolidation. Increased opacity compared to most recent chest radiograph. Stable cardiac silhouette. Central catheter tip in right atrium without pneumothorax. Aortic Atherosclerosis (ICD10-I70.0). Electronically Signed   By: Lowella Grip III M.D.   On: 09/28/2020 08:45    Medications: Infusions: . sodium chloride    . sodium chloride    . magnesium sulfate bolus IVPB      Scheduled Medications: . (feeding supplement) PROSource Plus  30 mL Oral QID  . amoxicillin-clavulanate  1 tablet Oral Daily  . apixaban  5 mg Oral BID  . Chlorhexidine Gluconate Cloth  6 each Topical Q0600  . doxycycline  100 mg Oral Q12H  . insulin aspart  0-15 Units Subcutaneous TID WC  . insulin aspart  0-5 Units Subcutaneous QHS  . midodrine  10 mg Oral TID WC  . multivitamin  1 tablet Oral QHS  . Ensure Max Protein  11 oz Oral BID  . traZODone  25 mg Oral QHS    have reviewed scheduled and prn medications.  Physical Exam: General:NAD, sitting on chair, denies any symptoms Heart:RRR, s1s2 nl Lungs: Basal decreased breath sound Abdomen:soft, Non-tender,  non-distended Extremities: Edema present and chronic stasis changes. Dialysis Access: Right IJ TDC, left upper extremity AV fistula site has no bleeding.  Minimal swelling Neurology: Alert awake and oriented x3.  Lajuana Patchell Prasad Shrika Milos 09/28/2020,11:12 AM  LOS: 20 days  Pager: [phone]

## 2020-09-28 NOTE — TOC Progression Note (Addendum)
Transition of Care (TOC) - Progression Note    Patient Details  Name: [name]. MRN: [id number] Date of Birth: [birth year]  Transition of Care [facility]) CM/SW Contact  [address], [place], [place] Phone Number: 09/28/2020, 10:54 AM  Clinical Narrative:    Phone call to [facility], left message with Juliann Pulse regarding patient transitioning there today.  Discharge cancelled, patient has new COVID infection.    Transition of Care to continue to follow.  [address], LCSW Transition of Care [phone]    Expected Discharge Plan: Skilled Nursing Facility Barriers to Discharge: Continued Medical Work up  Expected Discharge Plan and Services Expected Discharge Plan: [place] In-house Referral: Clinical Social Work     Living arrangements for the past 2 months: Single Family Home                                       Social Determinants of Health (SDOH) Interventions    Readmission Risk Interventions No flowsheet data found.

## 2020-09-29 ENCOUNTER — Inpatient Hospital Stay (HOSPITAL_COMMUNITY): Payer: Medicare Other

## 2020-09-29 DIAGNOSIS — M869 Osteomyelitis, unspecified: Secondary | ICD-10-CM | POA: Diagnosis not present

## 2020-09-29 LAB — COMPREHENSIVE METABOLIC PANEL
ALT: 89 U/L — ABNORMAL HIGH (ref 0–44)
AST: 234 U/L — ABNORMAL HIGH (ref 15–41)
Albumin: 2.8 g/dL — ABNORMAL LOW (ref 3.5–5.0)
Alkaline Phosphatase: 58 U/L (ref 38–126)
Anion gap: 13 (ref 5–15)
BUN: 46 mg/dL — ABNORMAL HIGH (ref 8–23)
CO2: 20 mmol/L — ABNORMAL LOW (ref 22–32)
Calcium: 8.2 mg/dL — ABNORMAL LOW (ref 8.9–10.3)
Chloride: 101 mmol/L (ref 98–111)
Creatinine, Ser: 6.61 mg/dL — ABNORMAL HIGH (ref 0.61–1.24)
GFR, Estimated: 8 mL/min — ABNORMAL LOW (ref >60)
Glucose, Bld: 106 mg/dL — ABNORMAL HIGH (ref 70–99)
Potassium: 3.4 mmol/L — ABNORMAL LOW (ref 3.5–5.1)
Sodium: 134 mmol/L — ABNORMAL LOW (ref 135–145)
Total Bilirubin: 0.6 mg/dL (ref 0.3–1.2)
Total Protein: 7.8 g/dL (ref 6.5–8.1)

## 2020-09-29 LAB — CBC
HCT: 36.4 % — ABNORMAL LOW (ref 39.0–52.0)
Hemoglobin: 11.5 g/dL — ABNORMAL LOW (ref 13.0–17.0)
MCH: 26.9 pg (ref 26.0–34.0)
MCHC: 31.6 g/dL (ref 30.0–36.0)
MCV: 85 fL (ref 80.0–100.0)
Platelets: 205 10*3/uL (ref 150–400)
RBC: 4.28 MIL/uL (ref 4.22–5.81)
RDW: 15.9 % — ABNORMAL HIGH (ref 11.5–15.5)
WBC: 5.1 10*3/uL (ref 4.0–10.5)
nRBC: 0 % (ref 0.0–0.2)

## 2020-09-29 LAB — CBC WITH DIFFERENTIAL/PLATELET
Abs Immature Granulocytes: 0.02 10*3/uL (ref 0.00–0.07)
Basophils Absolute: 0 10*3/uL (ref 0.0–0.1)
Basophils Relative: 1 %
Eosinophils Absolute: 0 10*3/uL (ref 0.0–0.5)
Eosinophils Relative: 0 %
HCT: 36.1 % — ABNORMAL LOW (ref 39.0–52.0)
Hemoglobin: 11.3 g/dL — ABNORMAL LOW (ref 13.0–17.0)
Immature Granulocytes: 0 %
Lymphocytes Relative: 17 %
Lymphs Abs: 0.9 10*3/uL (ref 0.7–4.0)
MCH: 26.7 pg (ref 26.0–34.0)
MCHC: 31.3 g/dL (ref 30.0–36.0)
MCV: 85.1 fL (ref 80.0–100.0)
Monocytes Absolute: 0.5 10*3/uL (ref 0.1–1.0)
Monocytes Relative: 9 %
Neutro Abs: 3.9 10*3/uL (ref 1.7–7.7)
Neutrophils Relative %: 73 %
Platelets: 212 10*3/uL (ref 150–400)
RBC: 4.24 MIL/uL (ref 4.22–5.81)
RDW: 16 % — ABNORMAL HIGH (ref 11.5–15.5)
WBC: 5.3 10*3/uL (ref 4.0–10.5)
nRBC: 0 % (ref 0.0–0.2)

## 2020-09-29 LAB — RENAL FUNCTION PANEL
Albumin: 2.8 g/dL — ABNORMAL LOW (ref 3.5–5.0)
Anion gap: 21 — ABNORMAL HIGH (ref 5–15)
BUN: 45 mg/dL — ABNORMAL HIGH (ref 8–23)
CO2: 16 mmol/L — ABNORMAL LOW (ref 22–32)
Calcium: 8.4 mg/dL — ABNORMAL LOW (ref 8.9–10.3)
Chloride: 100 mmol/L (ref 98–111)
Creatinine, Ser: 6.66 mg/dL — ABNORMAL HIGH (ref 0.61–1.24)
GFR, Estimated: 8 mL/min — ABNORMAL LOW (ref >60)
Glucose, Bld: 96 mg/dL (ref 70–99)
Phosphorus: 6 mg/dL — ABNORMAL HIGH (ref 2.5–4.6)
Potassium: 3.5 mmol/L (ref 3.5–5.1)
Sodium: 137 mmol/L (ref 135–145)

## 2020-09-29 LAB — MAGNESIUM: Magnesium: 2.1 mg/dL (ref 1.7–2.4)

## 2020-09-29 LAB — C-REACTIVE PROTEIN: CRP: 10.2 mg/dL — ABNORMAL HIGH (ref <1.0)

## 2020-09-29 LAB — GLUCOSE, CAPILLARY
Glucose-Capillary: 101 mg/dL — ABNORMAL HIGH (ref 70–99)
Glucose-Capillary: 112 mg/dL — ABNORMAL HIGH (ref 70–99)
Glucose-Capillary: 87 mg/dL (ref 70–99)

## 2020-09-29 LAB — BRAIN NATRIURETIC PEPTIDE: B Natriuretic Peptide: 1307.5 pg/mL — ABNORMAL HIGH (ref 0.0–100.0)

## 2020-09-29 LAB — D-DIMER, QUANTITATIVE: D-Dimer, Quant: 3.45 ug/mL-FEU — ABNORMAL HIGH (ref 0.00–0.50)

## 2020-09-29 LAB — PROCALCITONIN: Procalcitonin: 2.5 ng/mL

## 2020-09-29 MED ORDER — PENTAFLUOROPROP-TETRAFLUOROETH EX AERO: 1.0000 "application " | INHALATION_SPRAY | CUTANEOUS | Status: DC | PRN

## 2020-09-29 MED ORDER — LIDOCAINE-PRILOCAINE 2.5-2.5 % EX CREA
1.0000 "application " | TOPICAL_CREAM | CUTANEOUS | Status: DC | PRN
  Filled 2020-09-29: qty 5

## 2020-09-29 MED ORDER — SODIUM CHLORIDE 0.9 % IV SOLN: 100.0000 mL | INTRAVENOUS | Status: DC | PRN

## 2020-09-29 MED ORDER — HEPARIN SODIUM (PORCINE) 1000 UNIT/ML IJ SOLN
INTRAMUSCULAR | Status: AC
  Filled 2020-09-29: qty 4

## 2020-09-29 MED ORDER — ALTEPLASE 2 MG IJ SOLR: 2.0000 mg | Freq: Once | INTRAMUSCULAR | Status: DC | PRN

## 2020-09-29 MED ORDER — CHLORHEXIDINE GLUCONATE CLOTH 2 % EX PADS
6.0000 | MEDICATED_PAD | Freq: Every day | CUTANEOUS | Status: DC
  Administered 2020-09-29 – 2020-09-30 (×2): 6 via TOPICAL

## 2020-09-29 MED ORDER — HEPARIN SODIUM (PORCINE) 1000 UNIT/ML DIALYSIS: 1000.0000 [IU] | INTRAMUSCULAR | Status: DC | PRN

## 2020-09-29 MED ORDER — LIDOCAINE HCL (PF) 1 % IJ SOLN
5.0000 mL | INTRAMUSCULAR | Status: DC | PRN
  Filled 2020-09-29: qty 5

## 2020-09-29 NOTE — Progress Notes (Signed)
Orthopedic Tech Progress Note Patient Details:  [name] 02-18-1951 [id number] Ordered a larger PRAFO boot for patient through [facility] after speaking with physical therapy about previous boot not fitting correctly. Boot will be delivered and applied in the morning, 09/30/2020. Patient ID: [name], male   DOB: 1951/05/27, 69 y.o.   MRN: [id number]   [name] 09/29/2020, 5:45 PM

## 2020-09-29 NOTE — TOC Progression Note (Signed)
Transition of Care (TOC) - Progression Note    Patient Details  Name: Hamlin Devine. MRN: [id number] Date of Birth: 1951/08/19  Transition of Care [facility]) CM/SW Contact  Pollie Friar, RN Phone Number: 09/29/2020, 2:58 PM  Clinical Narrative:    CM spoke to patients daughter over the phone and [facility] has been arranged for [facility] services.  Wheelchair for home ordered through [facility] and will be delivered to the room. Daughter will provide transport home when medically ready.    Expected Discharge Plan: [place] Barriers to Discharge: Continued Medical Work up  Expected Discharge Plan and Services Expected Discharge Plan: [place] In-house Referral: Clinical Social Work   Post Acute Care Choice: Home Health, Durable Medical Equipment Living arrangements for the past 2 months: [place]                 DME Arranged: Wheelchair manual DME Agency: [employer] Date DME Agency Contacted: 09/29/20   Representative spoke with at DME Agency: Freda Munro HH Arranged: RN, PT, OT [facility] Agency: [facility] [place] Date [place]: 09/29/20   Representative spoke with at [place]: Amy   Social Determinants of Health ([place]) Interventions    Readmission Risk Interventions No flowsheet data found.

## 2020-09-29 NOTE — Progress Notes (Signed)
Wheelchair:   Patient suffers from osteomyelitis of toes which impairs their ability to perform daily activities like ambulating in the home. A walker will not resolve  issue with performing activities of daily living. A wheelchair will allow patient to safely perform daily activities. Patient is not able to propel themselves in the home using a standard weight wheelchair due to weakness. Patient can self propel in the lightweight wheelchair. Length of need Lifetime.  Accessories: elevating leg rests (ELRs), wheel locks, extensions and anti-tippers.

## 2020-09-29 NOTE — Progress Notes (Addendum)
PT Cancellation Note  Patient Details Name: [name]. MRN: [id number] DOB: 26-Sep-1951   Cancelled Treatment:    Reason Eval/Treat Not Completed: Medical issues which prohibited therapy  Patient's resting HR 122-148 (irreg). LUE restricted and bil LEs with profore dressings therefore unable to use for BP. RN okayed using RUE (has IJ dialysis catheter) with BP reading 176/166 despite arm being relaxed throughout. Patient is warm to touch and reporting severe pain in rt foot. RN with patient to address issues.    Addendum-Noted order for rt PRAFO and notes indicating it has not been used due to being too small. Patient was transferred to 5W over weekend and cannot find PRAFO in his room. Will contact ortho tech re: ?new PRAFO to protect rt heel.   Arby Barrette, PT Pager [phone]   Rexanne Mano 09/29/2020, 2:21 PM

## 2020-09-29 NOTE — Progress Notes (Signed)
Pt returned from HD.

## 2020-09-29 NOTE — Progress Notes (Signed)
Received call from front desk stating that patient's daughter [name] was just informed that pt was unable to have visitors due to new found covid positive. They stated [name] brushed past personnel at the desk and started to run towards unit. By the time the phone call had ended, [name] was at 5W's entrance doors. [name] continuously shoulder checked the locked unit entrance doors and eventually broke into the doors to make her way onto the department. [name] verbally aggressive, stating we were incompetent nurses and stated that the covid vaccine gave her father covid and demanded a repeat test. [name] also had paperwork in her hand that she wanted to the physician to fill out. Paperwork was handed to then Financial controller for safe keepings. Four security officers then greeted her, escorted her off the unit and banned her from the hospital.

## 2020-09-29 NOTE — Progress Notes (Signed)
Pt transported off unit on monitor to HD.

## 2020-09-29 NOTE — Progress Notes (Signed)
Renal Navigator received message from patient's daughter/M. Jimmye Norman who stated that she wished to take patient home from hospital as soon as he was medically ready to discharge and had questions about outpatient HD now that he is COVID positive.  Navigator confirmed with [facility] that they will be treating him in isolation on a MWF 3rd shift and that they can start him on Wednesday at 5:30pm if he is ready for discharge before then. Renal Navigator spoke with CSW/N. Rayyan regarding patient needs now that he will not be able to discharge to SNF as previously planned. CM looking in to [facility] availability. Renal Navigator spoke with patient's daughter multiple times throughout the day as we discussed discharge planning. Navigator provided space as Ms. Jimmye Norman spoke about her experience with her father and learning of his COVID positive status. She is also speaking with unit directors. She was extremely appreciative of support and assistance provided. Renal Navigator spoke with Attending/Dr. Candiss Norse and Nephrologist/Dr. Justin Mend. It appears patient will be ready for discharge tomorrow, which CM confirmed. Navigator has asked Dr. Justin Mend to write orders for patient to have HD today instead of tomorrow in order to get him on outpatient COVID iso shift schedule. Navigator also spoke with HD acute unit Charge RN to explain. Patient was able to have HD today and we will plan for his next treatment at his outpatient clinic on Wednesday evening. Patient's daughter aware. She can provide transportation to 3rd shift. Patient will run 3 hour txs in the COVID shift. Patient's daughter will go to clinic today by 4:00pm to complete intake paperwork for her father.  Renal Navigator will confirm discharge tomorrow and ask Renal PA to send orders to clinic.  Alphonzo Cruise, [place] Renal Navigator [phone]

## 2020-09-29 NOTE — Progress Notes (Signed)
Pt refused to go back on bed, bed/chg bath, weighing, bladder scan claimed not feeling well to go up from chair

## 2020-09-29 NOTE — Progress Notes (Addendum)
PROGRESS NOTE    [name].  FYB:[id number] DOB: 07/04/1951 DOA: 09/08/2020 PCP: Aura Dials, MD   Chief Complaint  Patient presents with  . Wound Infection    Brief Narrative:  70 year old male with N2DP, chronic systolic and diastolic CHF, PAF supposed to be on Eliquis, HTN, morbid obesity brought to ED 09/08/2020 after he was found down in his house.  Reportedly patient was followed.  Protective services, has very unsanitary living condition at home and was found down in the bathroom during welfare check. Per report: Patient reports falling while standing up from bathroom without LOC, resulting in fall onto R chest and resultant R lower chest pain. Patient was found down and brought into ED. Due to his poor living conditions his daughter was involved in his care, he is Noncompliant in general and very self-neglected with denial component.   He was found to have necrotic foot infections and underwent xray of right foot, which revealed subcortical lucency involving 5th digit concerning for OM.  Had massive lower extremity edema and apparently had stopped taking torsemide several weeks ago as he did not think he needed it.  Has been seen by general surgery, psychiatry along with Dr. Sharol Given orthopedic surgery, amputation of the right fifth digit was offered but patient declined, he is currently being treated with Augmentin and doxycycline with plans of being on chronic doxycycline post discharge and follow-up with Dr. Sharol Given.  Was started on diuretics this admission unfortunately his renal function continued to decline, he was seen by nephrology and now has progressed to ESRD and is HD dependent, underwent right IJ tunneled hemodialysis catheter placement and left radiocephalic AV fistula creation by Dr. Donnetta Hutching  He needed a Covid PCR test for SNF placement & also spiked a low grade fever on early 09/27/2020 after which he was tested for COVID-19 and he was positive for the same with some  x-ray evidence of pneumonia, of note patient was unvaccinated for COVID-19 till he was hospitalized and got his first vaccine shot on 09/16/2020 with not enough time to mount immunity.  He was transferred to my care on 09/28/2020 on day 20 of his hospital stay.    Subjective:  Patient in bed, appears comfortable, denies any headache, no fever, no chest pain or pressure, no shortness of breath , no abdominal pain. No focal weakness.    Assessment & Plan:  SNF placement related COVID-19 test along with low-grade fever early morning 09/27/2020 showing positive COVID-19 infection diagnosed in the hospital on 09/27/2020, x-ray showing interstitial pattern as well.  Patient was not previously vaccinated but got his Covid vaccine while in this hospital however for short was given on 09/16/2020.  Still does not have full immunity for it.  Patient is currently asymptomatic from pulmonary standpoint, he has consented for Covid antibody infusion, daughter informed as well.  If worse he will get steroids and Remdesivir.  Unfortunately due to active lower extremity infections he is not a candidate for Actemra or Baricitinib.  This plan was clearly given to patient and his daughter Oswaldo Milian, daughter [name] on 09/28/2020 requested that patient be given ivermectin, she was told that this is not the hospital COVID-19 protocol and we cannot use ivermectin as we do not have any positive experience with it at this facility.  She also informed me that she is a Therapist, sports and that Covid Vaccines are known to give Covid 19 infections, also some studies are showing that remdesivir is causing a lot of harm  to lungs, also informed that such studies have not been reviewed by our COVID-19 team.   Patient's daughter had confrontation with the security staff on 09/27/2020 after which which she had to be escorted out of the hospital premises, she is currently not allowed to visit the patient as per hospital security staff.  Patient's daughter  has submitted a prefilled FMLA form on 09/28/2020, she was informed that I cannot sign a form that was almost entirely filled entirely by her for the information that has to be provided by the medical provider, this form needs to be filled by the physician as deemed appropriate.   Recent Labs  Lab 09/24/20 0410 09/24/20 0410 09/24/20 1345 09/25/20 0506 09/26/20 0144 09/27/20 1003 09/27/20 1229 09/28/20 0545 09/28/20 0811 09/29/20 0238  WBC 6.1  --   --  6.3 8.6  --   --  6.1  --  5.3  HGB 10.5*   < > 11.9* 9.8* 10.6*  --   --  10.9*  --  11.3*  HCT 33.9*   < > 35.0* 31.6* 34.0*  --   --  35.3*  --  36.1*  PLT 243  --   --  186 193  --   --  195  --  212  CRP  --   --   --   --   --   --   --   --  8.4* 10.2*  BNP  --   --   --   --   --   --   --   --  1,399.4* 1,307.5*  DDIMER  --   --   --   --   --   --   --   --  3.02* 3.45*  PROCALCITON  --   --   --   --   --   --   --   --  1.81 2.50  AST  --   --   --   --   --   --   --   --   --  234*  ALT  --   --   --   --   --   --   --   --   --  62*  ALKPHOS  --   --   --   --   --   --   --   --   --  58  BILITOT  --   --   --   --   --   --   --   --   --  0.6  ALBUMIN  --   --   --   --  3.1*  --   --  2.9*  --  2.8*  SARSCOV2NAA  --   --   --   --   --  POSITIVE* POSITIVE*  --   --   --    < > = values in this interval not displayed.      AKI on CKD stage III with baseline creatinine 1.3-1.9: Seen by Nephrology - etiology multifactorial including cardiorenal syndrome, ATN.  Now on dialysis since 11/3 and tolerating well, at times episode of hypotension and is started on midodrine.  S/P Rtt IJ tunneled HD  catheter placement and left radiocephalic AV fistula creation by Dr. Donnetta Hutching 11/10.  Continue dialysis per Renal.   Acute on chronic combined systolic and diastolic CHF: LVEF 40-08% in LVEF, previously back in  March EF was 40-45%: TTE shows lvef 30-35%, severe concentric LVH, global hypokinesia.  Now dialysis dependent and volume  will be managed in dialysis.  Blood pressure and renal function prohibits the use of beta-blocker, ACE/ARB or Entresto.   Hypotension: BP soft, continue midodrine. Likely multifactorial, including low EF.  Extensive bilateral lower extremity edema wound on right foot,Osteomyelitis of fifth toe of right foot per x-ray, ABI indeterminate due to lower extremity edema.  Seen by Dr. Sharol Given offered amputation but patient refused.  Family agreed with patient decision not to pursue amputation.  Plan is to continue aggressive wound care, outpatient doxycycline long-term, follow-up with Dr. Sharol Given as outpatient, continue wound care, patient clearly understands the risk of not undergoing surgical amputation and that it can make him septic and can cause death and disability.  Chronic lymphedema: Continue Una boot, close wound care follow-up.   Falls at home/neuropathy: Continue PT OT, will need skilled nursing facility placement vs HHPT.  Severe sepsis POA: Sepsis physiology has resolved.  Acute encephalopathy, uremic/septic on presentation per chart. At this time mental status is stable.    PAF with RVR: Noncompliant with meds. Blood pressure too low for beta-blocker.  Got the procedure done yesterday if okay with nephrology will switch to Eliquis- pharmacy to adjust dose 2/2 AKI.  Hematuria after placing foley catheter suspect catheter related trauma: off Foley 11/9. Patient denies any more hematuria.  Morbid obesity with BMI 43>41, he will benefit with weight loss healthy lifestyle outpatient sleep apnea evaluation but I am not sure how much compliant of feasible it is for him.   Type 2 diabetes mellitus: A1c uncontrolled 9.6 10/25.  Blood sugar is controlled on sliding scale.  Blood sugar stable on sliding scale insulin  Recent Labs  Lab 09/28/20 0747 09/28/20 1149 09/28/20 1635 09/28/20 2216 09/29/20 0732  GLUCAP 92 110* 101* 120* 101*        Nutrition: Diet Order            Diet renal  with fluid restriction Fluid restriction: 1200 mL Fluid; Room service appropriate? Yes; Fluid consistency: Thin  Diet effective now                 Nutrition Problem: Increased nutrient needs Etiology: wound healing, acute illness Signs/Symptoms: estimated needs Interventions: MVI, Other (Comment) (Prosource Plus)  Body mass index is 39.3 kg/m.  Pressure Ulcer: Pressure Injury 09/08/20 Heel Right Stage 3 -  Full thickness tissue loss. Subcutaneous fat may be visible but bone, tendon or muscle are NOT exposed. 25% red, 75% slouogh, strong odor, mod brown (Active)  09/08/20   Location: Heel  Location Orientation: Right  Staging: Stage 3 -  Full thickness tissue loss. Subcutaneous fat may be visible but bone, tendon or muscle are NOT exposed.  Wound Description (Comments): 25% red, 75% slouogh, strong odor, mod brown  Present on Admission: Yes     Pressure Injury 09/17/20 Buttocks Right;Left Deep Tissue Pressure Injury - Purple or maroon localized area of discolored intact skin or blood-filled blister due to damage of underlying soft tissue from pressure and/or shear. (Active)  09/17/20 1805  Location: Buttocks  Location Orientation: Right;Left  Staging: Deep Tissue Pressure Injury - Purple or maroon localized area of discolored intact skin or blood-filled blister due to damage of underlying soft tissue from pressure and/or shear.  Wound Description (Comments):   Present on Admission:      DVT prophylaxis: Eliquis  Code Status:  Code Status: Full Code   Family  Communication: plan of care discussed with patient.  Daughters have been updated multiple times.  Family meeting completed 09/23/2020 at bedside along with nephrology and palliative care be RN.    Updated daughter 09/28/2020, 09/29/2020  Status ID:[id number] Remains inpatient appropriate because:IV treatments appropriate due to intensity of illness or inability to take PO and Inpatient level of care appropriate due to  severity of illness  Dispo:The patient is from: Home            Anticipated d/c is to: SNF            Anticipated d/c date is: Once SNF and Outpatient dialysis has been arranged & he is stable from Covid standpoint.             Patient currently is not medically stable to d/c.   Consultants:see note  Procedures:see note  Culture/Microbiology    Component Value Date/Time   SDES  09/08/2020 2103    BLOOD LEFT HAND Performed at [facility], [place] [address] [place], [place] [address]    SPECREQUEST  09/08/2020 2103    BOTTLES DRAWN AEROBIC AND ANAEROBIC Blood Culture adequate volume Performed at [facility] [address] [place], [place] [address]    CULT  09/08/2020 2103    NO GROWTH 5 DAYS Performed at [facility] [address] [place], [place] [address]    REPTSTATUS 09/13/2020 FINAL 09/08/2020 2103    Other culture-see note  Medications:  Scheduled Meds: . (feeding supplement) PROSource Plus  30 mL Oral QID  . amoxicillin-clavulanate  1 tablet Oral Daily  . apixaban  5 mg Oral BID  . Chlorhexidine Gluconate Cloth  6 each Topical Q0600  . Chlorhexidine Gluconate Cloth  6 each Topical Q0600  . doxycycline  100 mg Oral Q12H  . insulin aspart  0-15 Units Subcutaneous TID WC  . insulin aspart  0-5 Units Subcutaneous QHS  . multivitamin  1 tablet Oral QHS  . Ensure Max Protein  11 oz Oral BID  . traZODone  25 mg Oral QHS   Continuous Infusions: . sodium chloride    . sodium chloride    . sodium chloride    . famotidine (PEPCID) IV      Antimicrobials:  Anti-infectives (From admission, onward)   Start     Dose/Rate Route Frequency Ordered Stop   09/17/20 1000  amoxicillin-clavulanate (AUGMENTIN) 500-125 MG per tablet 500 mg        1 tablet Oral Daily 09/17/20 0839     09/14/20 2200  amoxicillin-clavulanate (AUGMENTIN) 500-125 MG per tablet 500 mg  Status:  Discontinued        1 tablet Oral 2 times daily 09/14/20  1524 09/17/20 0839   09/12/20 1000  amoxicillin-clavulanate (AUGMENTIN) 875-125 MG per tablet 1 tablet  Status:  Discontinued        1 tablet Oral Every 12 hours 09/12/20 0823 09/14/20 1524   09/12/20 1000  doxycycline (VIBRA-TABS) tablet 100 mg        100 mg Oral Every 12 hours 09/12/20 0823     09/10/20 2000  DAPTOmycin (CUBICIN) 740 mg in sodium chloride 0.9 % IVPB  Status:  Discontinued        740 mg 229.6 mL/hr over 30 Minutes Intravenous Daily 09/10/20 1230 09/12/20 0822   09/10/20 1200  metroNIDAZOLE (FLAGYL) IVPB 500 mg  Status:  Discontinued        500 mg 100 mL/hr over 60 Minutes Intravenous Every 8 hours  09/10/20 1050 09/12/20 0822   09/10/20 1132  vancomycin variable dose per unstable renal function (pharmacist dosing)  Status:  Discontinued         Does not apply See admin instructions 09/10/20 1132 09/10/20 1230   09/09/20 1700  vancomycin (VANCOREADY) IVPB 1500 mg/300 mL  Status:  Discontinued        1,500 mg 150 mL/hr over 120 Minutes Intravenous Every 24 hours 09/08/20 1956 09/10/20 1132   09/09/20 1000  ceFEPIme (MAXIPIME) 2 g in sodium chloride 0.9 % 100 mL IVPB  Status:  Discontinued        2 g 200 mL/hr over 30 Minutes Intravenous Every 12 hours 09/08/20 1947 09/12/20 0822   09/08/20 2200  ceFEPIme (MAXIPIME) 2 g in sodium chloride 0.9 % 100 mL IVPB        2 g 200 mL/hr over 30 Minutes Intravenous  Once 09/08/20 1754 09/08/20 2206   09/08/20 1800  vancomycin (VANCOCIN) IVPB 1000 mg/200 mL premix  Status:  Discontinued        1,000 mg 200 mL/hr over 60 Minutes Intravenous  Once 09/08/20 1754 09/08/20 1756   09/08/20 1630  vancomycin (VANCOREADY) IVPB 2000 mg/400 mL        2,000 mg 200 mL/hr over 120 Minutes Intravenous  Once 09/08/20 1551 09/08/20 1842   09/08/20 1600  piperacillin-tazobactam (ZOSYN) IVPB 3.375 g        3.375 g 100 mL/hr over 30 Minutes Intravenous  Once 09/08/20 1549 09/08/20 1645     Objective: Vitals: Today's Vitals   09/29/20 0500 09/29/20  0546 09/29/20 0600 09/29/20 0734  BP:  (!) 121/106  (!) 95/57  Pulse:  97  77  Resp:  18  20  Temp:  99.4 F (37.4 C)  98.6 F (37 C)  TempSrc:  Oral  Axillary  SpO2: 98% 92%  95%  Weight:      Height:      PainSc:   0-No pain     Intake/Output Summary (Last 24 hours) at 09/29/2020 1055 Last data filed at 09/29/2020 0000 Gross per 24 hour  Intake 350 ml  Output --  Net 350 ml   Filed Weights   09/26/20 2121 09/27/20 0500 09/28/20 0500  Weight: 123 kg 123 kg 120.7 kg   Weight change:   Intake/Output from previous day: 11/14 0701 - 11/15 0700 In: 590 [P.O.:490; IV Piggyback:100] Out: -  Intake/Output this shift: No intake/output data recorded.  Examination:  Awake Alert, No new F.N deficits, Normal affect [place].AT,PERRAL Supple Neck,No JVD, No cervical lymphadenopathy appriciated.  Symmetrical Chest wall movement, Good air movement bilaterally, CTAB RRR,No Gallops, Rubs or new Murmurs, No Parasternal Heave +ve B.Sounds, Abd Soft, No tenderness, No organomegaly appriciated, No rebound - guarding or rigidity.  Leg wound pictures are below -            Data Reviewed: I have personally reviewed following labs and imaging studies -  CBC:  Recent Labs  Lab 09/23/20 0340 09/23/20 1127 09/24/20 0410 09/24/20 0410 09/24/20 1345 09/25/20 0506 09/26/20 0144 09/28/20 0545 09/29/20 0238  WBC 6.5  --  6.1  --   --  6.3 8.6 6.1 5.3  NEUTROABS 3.7  --   --   --   --   --   --   --  3.9  HGB 11.0*   < > 10.5*   < > 11.9* 9.8* 10.6* 10.9* 11.3*  HCT 36.5*   < > 33.9*   < >  35.0* 31.6* 34.0* 35.3* 36.1*  MCV 88.6  --  89.0  --   --  85.4 85.6 85.3 85.1  PLT 252  --  243  --   --  186 193 195 212   < > = values in this interval not displayed.   Basic Metabolic Panel: Recent Labs  Lab 09/23/20 0340 09/23/20 0340 09/24/20 1345 09/25/20 0506 09/26/20 0144 09/28/20 0545 09/28/20 0811 09/29/20 0238  NA 137   < > 140 137 136 138  --  134*  K 3.6   < > 3.9 3.5  3.9 3.5  --  3.4*  CL 102   < > 105 101 101 101  --  101  CO2 22  --   --  23 22 21*  --  20*  GLUCOSE 110*   < > 113* 100* 140* 97  --  106*  BUN 52*   < > 79* 39* 60* 53*  --  46*  CREATININE 6.15*   < > 7.90* 5.24* 6.79* 7.23*  --  6.61*  CALCIUM 8.3*  --   --  8.0* 8.3* 8.5*  --  8.2*  MG  --   --   --   --   --   --  1.8 2.1  PHOS  --   --   --   --  6.9* 6.4*  --   --    < > = values in this interval not displayed.   GFR: Estimated Creatinine Clearance: 13.1 mL/min (A) (by C-G formula based on SCr of 6.61 mg/dL (H)). Liver Function Tests: Recent Labs  Lab 09/26/20 0144 09/28/20 0545 09/29/20 0238  AST  --   --  234*  ALT  --   --  89*  ALKPHOS  --   --  58  BILITOT  --   --  0.6  PROT  --   --  7.8  ALBUMIN 3.1* 2.9* 2.8*   No results for input(s): LIPASE, AMYLASE in the last 168 hours. No results for input(s): AMMONIA in the last 168 hours. Coagulation Profile: No results for input(s): INR, PROTIME in the last 168 hours. Cardiac Enzymes: No results for input(s): CKTOTAL, CKMB, CKMBINDEX, TROPONINI in the last 168 hours. BNP (last 3 results) No results for input(s): PROBNP in the last 8760 hours. HbA1C: No results for input(s): HGBA1C in the last 72 hours. CBG: Recent Labs  Lab 09/28/20 0747 09/28/20 1149 09/28/20 1635 09/28/20 2216 09/29/20 0732  GLUCAP 92 110* 101* 120* 101*   Lipid Profile: No results for input(s): CHOL, HDL, LDLCALC, TRIG, CHOLHDL, LDLDIRECT in the last 72 hours. Thyroid Function Tests: No results for input(s): TSH, T4TOTAL, FREET4, T3FREE, THYROIDAB in the last 72 hours. Anemia Panel: No results for input(s): VITAMINB12, FOLATE, FERRITIN, TIBC, IRON, RETICCTPCT in the last 72 hours. Sepsis Labs: Recent Labs  Lab 09/28/20 0811 09/29/20 0238  PROCALCITON 1.81 2.50    Recent Results (from the past 240 hour(s))  SARS Coronavirus 2 by RT PCR (hospital order, performed in [facility] hospital lab) Nasopharyngeal Nasopharyngeal Swab      Status: Abnormal   Collection Time: 09/27/20 10:03 AM   Specimen: Nasopharyngeal Swab  Result Value Ref Range Status   SARS Coronavirus 2 POSITIVE (A) NEGATIVE Final    Comment: RESULT CALLED TO, READ BACK BY AND VERIFIED WITH: RN C APPLLEWHITE [id number] AT 1143 BY CM (NOTE) SARS-CoV-2 target nucleic acids are DETECTED  SARS-CoV-2 RNA is generally detectable in upper respiratory specimens  during the acute phase of infection.  Positive results are indicative  of the presence of the identified virus, but do not rule out bacterial infection or co-infection with other pathogens not detected by the test.  Clinical correlation with patient history and  other diagnostic information is necessary to determine patient infection status.  The expected result is negative.  Fact Sheet for Patients:   StrictlyIdeas.no   Fact Sheet for Healthcare Providers:   BankingDealers.co.za    This test is not yet approved or cleared by the [place] FDA and  has been authorized for detection and/or diagnosis of SARS-CoV-2 by FDA under an Emergency Use Authorization (EUA).  This EUA will remain in effect (meaning this  test can be used) for the duration of  the COVID-19 declaration under Section 564(b)(1) of the Act, 21 U.S.C. section 360-bbb-3(b)(1), unless the authorization is terminated or revoked sooner.  Performed at [facility], [place] [address] [place], [place] [address]   Resp Panel by RT PCR (RSV, Flu A&B, Covid) - Nasopharyngeal Swab     Status: Abnormal   Collection Time: 09/27/20 12:29 PM   Specimen: Nasopharyngeal Swab  Result Value Ref Range Status   SARS Coronavirus 2 by RT PCR POSITIVE (A) NEGATIVE Final    Comment: RESULT CALLED TO, READ BACK BY AND VERIFIED WITH: RN R AUGUSTUS [id number] AT 1404 BY CM (NOTE) SARS-CoV-2 target nucleic acids are DETECTED.  SARS-CoV-2 RNA is generally detectable in upper respiratory specimens    during the acute phase of infection. Positive results are indicative of the presence of the identified virus, but do not rule out bacterial infection or co-infection with other pathogens not detected by the test. Clinical correlation with patient history and other diagnostic information is necessary to determine patient infection status. The expected result is Negative.  Fact Sheet for Patients:  PinkCheek.be  Fact Sheet for Healthcare Providers: GravelBags.it  This test is not yet approved or cleared by the [place] FDA and  has been authorized for detection and/or diagnosis of SARS-CoV-2 by FDA under an Emergency Use Authorization (EUA).  This EUA will remain in effect (meaning this test can be  used) for the duration of  the COVID-19 declaration under Section 564(b)(1) of the Act, 21 U.S.C. section 360bbb-3(b)(1), unless the authorization is terminated or revoked sooner.      Influenza A by PCR NEGATIVE NEGATIVE Final   Influenza B by PCR NEGATIVE NEGATIVE Final    Comment: (NOTE) The Xpert Xpress SARS-CoV-2/FLU/RSV assay is intended as an aid in  the diagnosis of influenza from Nasopharyngeal swab specimens and  should not be used as a sole basis for treatment. Nasal washings and  aspirates are unacceptable for Xpert Xpress SARS-CoV-2/FLU/RSV  testing.  Fact Sheet for Patients: PinkCheek.be  Fact Sheet for Healthcare Providers: GravelBags.it  This test is not yet approved or cleared by the [place] FDA and  has been authorized for detection and/or diagnosis of SARS-CoV-2 by  FDA under an Emergency Use Authorization (EUA). This EUA will remain  in effect (meaning this test can be used) for the duration of the  Covid-19 declaration under Section 564(b)(1) of the Act, 21  U.S.C. section 360bbb-3(b)(1), unless the authorization is  terminated or  revoked.    Respiratory Syncytial Virus by PCR NEGATIVE NEGATIVE Final    Comment: (NOTE) Fact Sheet for Patients: PinkCheek.be  Fact Sheet for Healthcare Providers: GravelBags.it  This test is not yet approved or cleared by the [place]  FDA and  has been authorized for detection and/or diagnosis of SARS-CoV-2 by  FDA under an Emergency Use Authorization (EUA). This EUA will remain  in effect (meaning this test can be used) for the duration of the  COVID-19 declaration under Section 564(b)(1) of the Act, 21 U.S.C.  section 360bbb-3(b)(1), unless the authorization is terminated or  revoked. Performed at [facility], [place] [address] [place], [place] [address]   MRSA PCR Screening     Status: None   Collection Time: 09/28/20  8:30 AM   Specimen: Nasal Mucosa; Nasopharyngeal  Result Value Ref Range Status   MRSA by PCR NEGATIVE NEGATIVE Final    Comment:        The GeneXpert MRSA Assay (FDA approved for NASAL specimens only), is one component of a comprehensive MRSA colonization surveillance program. It is not intended to diagnose MRSA infection nor to guide or monitor treatment for MRSA infections. Performed at [facility], [place] [address], [place], [place] [address]      Radiology Studies:  DG Chest Port 1 View  Result Date: 09/28/2020 CLINICAL DATA:  Shortness of breath.  COVID-19 positive EXAM: PORTABLE CHEST 1 VIEW COMPARISON:  September 24, 2020 FINDINGS: Central catheter tip is in the right atrium without pneumothorax. There is patchy airspace opacity in the right mid lung and lung base regions, slightly increased from most recent study. Heart size and pulmonary vascularity are normal. No adenopathy. There is aortic atherosclerosis. Evidence of prior surgery or trauma involving each lateral clavicle noted. IMPRESSION: Patchy airspace opacity in the right mid lung and both base regions, likely due  to atypical organism pneumonia. No consolidation. Increased opacity compared to most recent chest radiograph. Stable cardiac silhouette. Central catheter tip in right atrium without pneumothorax. Aortic Atherosclerosis (ICD10-I70.0). Electronically Signed   By: Lowella Grip III M.D.   On: 09/28/2020 08:45     LOS: 21 days   Lala Lund, MD [facility]  09/29/2020, 10:55 AM

## 2020-09-29 NOTE — Consult Note (Signed)
[place] Nurse wound follow up Patient receiving care in [facility] 5W04.  Wound type: Necrotic wounds to plantar surface of right foot. BLE Profore wraps. I spoke with Stanton Kidney, primary RN.  The patient is now COVID +.  Stanton Kidney explained the patient has diarrhea, and has heavily soiled the BLE profore wraps with uncontrolled bouts of diarrhea.  I have change the order to the following: Place foam dressings over the wounds on the plantar surface of the right foot. Beginning behind the toes and going to just below the knee, spiral wrap kerlex, then Ace bandages. Change daily and prn soilage. I have informed Dr. Sharol Given via [place]. Val Riles, RN, MSN, CWOCN, CNS-BC, pager [phone]

## 2020-09-29 NOTE — Progress Notes (Signed)
Pt complained of too hot, checked temp=98.1F, given with fan, advised to go back on bed to have nice rest and sleep, but pt refused, pu chair alarm, pt agreed

## 2020-09-29 NOTE — Progress Notes (Signed)
Pt complained of cold and pain in legs and nausea, medicated see MAR, covered with warm blanket

## 2020-09-29 NOTE — Progress Notes (Signed)
[facility] ROUNDING NOTE   Subjective:   Brief history 69 year old with history of congestive heart failure diabetes atrial fibrillation hypertension found down during a welfare check.  He has diabetic foot infection acute kidney injury.  Baseline serum creatinine 1.3 to 1.9 mg/dL.  Started dialysis 09/17/2020 has been tolerating well.  There are no signs of renal recovery.  Palliative care meeting 09/23/2020 agreed to dialysis and permanent access.  Right IJ TDC and left radiocephalic AV fistula created by Dr. Donnetta Hutching 09/24/2020 last dialysis treatment 09/27/2020 with 1.2 L removed.  He will dialyze at [facility] Tuesday Thursday Saturday.  Next dialysis treatment will be 09/30/2020.  Blood pressure 121/106 pulse 106 temperature 99.4 O2 sats 94% room air.  Sodium 134 potassium 3.4 chloride 101 CO2 20 BUN 46 creatinine 6.6 glucose 106 phosphorus 6.4 magnesium 2.1 calcium 8.2 albumin 2.8 AST 234 ALT 89.  Hemoglobin 11.3    Objective:  Vital signs in last 24 hours:  Temp:  [98 F (36.7 C)-99.6 F (37.6 C)] 99.4 F (37.4 C) (11/15 0546) Pulse Rate:  [92-125] 97 (11/15 0546) Resp:  [18-20] 18 (11/15 0546) BP: (76-156)/(46-113) 121/106 (11/15 0546) SpO2:  [92 %-100 %] 92 % (11/15 0546)  Weight change:  Filed Weights   09/26/20 2121 09/27/20 0500 09/28/20 0500  Weight: 123 kg 123 kg 120.7 kg    Intake/Output: I/O last 3 completed shifts: In: 35 [P.O.:690; IV Piggyback:100] Out: 2000 [Other:2000]   Intake/Output this shift:  No intake/output data recorded.  CVS- RRR RS- CTA ABD- BS present soft non-distended EXT- no edema   Basic Metabolic Panel: Recent Labs  Lab 09/23/20 0340 09/23/20 0340 09/24/20 1345 09/25/20 0506 09/25/20 0506 09/26/20 0144 09/28/20 0545 09/28/20 0811 09/29/20 0238  NA 137   < > 140 137  --  136 138  --  134*  K 3.6   < > 3.9 3.5  --  3.9 3.5  --  3.4*  CL 102   < > 105 101  --  101 101  --  101  CO2 22  --    --  23  --  22 21*  --  20*  GLUCOSE 110*   < > 113* 100*  --  140* 97  --  106*  BUN 52*   < > 79* 39*  --  60* 53*  --  46*  CREATININE 6.15*   < > 7.90* 5.24*  --  6.79* 7.23*  --  6.61*  CALCIUM 8.3*   < >  --  8.0*   < > 8.3* 8.5*  --  8.2*  MG  --   --   --   --   --   --   --  1.8 2.1  PHOS  --   --   --   --   --  6.9* 6.4*  --   --    < > = values in this interval not displayed.    Liver Function Tests: Recent Labs  Lab 09/26/20 0144 09/28/20 0545 09/29/20 0238  AST  --   --  234*  ALT  --   --  89*  ALKPHOS  --   --  58  BILITOT  --   --  0.6  PROT  --   --  7.8  ALBUMIN 3.1* 2.9* 2.8*   No results for input(s): LIPASE, AMYLASE in the last 168 hours. No results for input(s): AMMONIA in the last 168 hours.  CBC: Recent Labs  Lab 09/23/20 0340 09/23/20 1127 09/24/20 0410 09/24/20 0410 09/24/20 1345 09/25/20 0506 09/26/20 0144 09/28/20 0545 09/29/20 0238  WBC 6.5  --  6.1  --   --  6.3 8.6 6.1 5.3  NEUTROABS 3.7  --   --   --   --   --   --   --  3.9  HGB 11.0*   < > 10.5*   < > 11.9* 9.8* 10.6* 10.9* 11.3*  HCT 36.5*   < > 33.9*   < > 35.0* 31.6* 34.0* 35.3* 36.1*  MCV 88.6  --  89.0  --   --  85.4 85.6 85.3 85.1  PLT 252  --  243  --   --  186 193 195 212   < > = values in this interval not displayed.    Cardiac Enzymes: No results for input(s): CKTOTAL, CKMB, CKMBINDEX, TROPONINI in the last 168 hours.  BNP: Invalid input(s): POCBNP  CBG: Recent Labs  Lab 09/27/20 2139 09/28/20 0747 09/28/20 1149 09/28/20 1635 09/28/20 2216  GLUCAP 106* 92 110* 101* 120*    Microbiology: Results for orders placed or performed during the hospital encounter of 09/08/20  Respiratory Panel by RT PCR (Flu A&B, Covid) - Nasopharyngeal Swab     Status: None   Collection Time: 09/08/20  5:13 PM   Specimen: Nasopharyngeal Swab  Result Value Ref Range Status   SARS Coronavirus 2 by RT PCR NEGATIVE NEGATIVE Final    Comment: (NOTE) SARS-CoV-2 target nucleic acids  are NOT DETECTED.  The SARS-CoV-2 RNA is generally detectable in upper respiratoy specimens during the acute phase of infection. The lowest concentration of SARS-CoV-2 viral copies this assay can detect is 131 copies/mL. A negative result does not preclude SARS-Cov-2 infection and should not be used as the sole basis for treatment or other patient management decisions. A negative result may occur with  improper specimen collection/handling, submission of specimen other than nasopharyngeal swab, presence of viral mutation(s) within the areas targeted by this assay, and inadequate number of viral copies (<131 copies/mL). A negative result must be combined with clinical observations, patient history, and epidemiological information. The expected result is Negative.  Fact Sheet for Patients:  PinkCheek.be  Fact Sheet for Healthcare Providers:  GravelBags.it  This test is no t yet approved or cleared by the [place] FDA and  has been authorized for detection and/or diagnosis of SARS-CoV-2 by FDA under an Emergency Use Authorization (EUA). This EUA will remain  in effect (meaning this test can be used) for the duration of the COVID-19 declaration under Section 564(b)(1) of the Act, 21 U.S.C. section 360bbb-3(b)(1), unless the authorization is terminated or revoked sooner.     Influenza A by PCR NEGATIVE NEGATIVE Final   Influenza B by PCR NEGATIVE NEGATIVE Final    Comment: (NOTE) The Xpert Xpress SARS-CoV-2/FLU/RSV assay is intended as an aid in  the diagnosis of influenza from Nasopharyngeal swab specimens and  should not be used as a sole basis for treatment. Nasal washings and  aspirates are unacceptable for Xpert Xpress SARS-CoV-2/FLU/RSV  testing.  Fact Sheet for Patients: PinkCheek.be  Fact Sheet for Healthcare Providers: GravelBags.it  This test is not  yet approved or cleared by the [place] FDA and  has been authorized for detection and/or diagnosis of SARS-CoV-2 by  FDA under an Emergency Use Authorization (EUA). This EUA will remain  in effect (meaning this test can be used) for the duration of the  Covid-19 declaration under Section 564(b)(1) of the Act, 21  U.S.C. section 360bbb-3(b)(1), unless the authorization is  terminated or revoked. Performed at [facility], [place] [address], [place], [place] [address]   Blood Cultures x 2 sites     Status: None   Collection Time: 09/08/20  7:04 PM   Specimen: BLOOD  Result Value Ref Range Status   Specimen Description   Final    BLOOD LEFT WRIST Performed at [facility] [address] [place], [place] [address]    Special Requests   Final    BOTTLES DRAWN AEROBIC AND ANAEROBIC Blood Culture adequate volume Performed at [place] [address] [place], [place] [address]    Culture   Final    NO GROWTH 5 DAYS Performed at [facility], [place] [address], [place], [place] [address]    Report Status 09/13/2020 FINAL  Final  Culture, blood (x 2)     Status: None   Collection Time: 09/08/20  9:03 PM   Specimen: BLOOD LEFT HAND  Result Value Ref Range Status   Specimen Description   Final    BLOOD LEFT HAND Performed at [place] [address] [place], [place] [address]    Special Requests   Final    BOTTLES DRAWN AEROBIC AND ANAEROBIC Blood Culture adequate volume Performed at [place] [address] [place], [place] [address]    Culture   Final    NO GROWTH 5 DAYS Performed at [facility], [place] [address], [place], [place] [address]    Report Status 09/13/2020 FINAL  Final  SARS Coronavirus 2 by RT PCR (hospital order, performed in [facility] hospital lab) Nasopharyngeal Nasopharyngeal Swab     Status: Abnormal   Collection Time: 09/27/20 10:03 AM    Specimen: Nasopharyngeal Swab  Result Value Ref Range Status   SARS Coronavirus 2 POSITIVE (A) NEGATIVE Final    Comment: RESULT CALLED TO, READ BACK BY AND VERIFIED WITH: RN C APPLLEWHITE [id number] AT 7001 BY CM (NOTE) SARS-CoV-2 target nucleic acids are DETECTED  SARS-CoV-2 RNA is generally detectable in upper respiratory specimens  during the acute phase of infection.  Positive results are indicative  of the presence of the identified virus, but do not rule out bacterial infection or co-infection with other pathogens not detected by the test.  Clinical correlation with patient history and  other diagnostic information is necessary to determine patient infection status.  The expected result is negative.  Fact Sheet for Patients:   StrictlyIdeas.no   Fact Sheet for Healthcare Providers:   BankingDealers.co.za    This test is not yet approved or cleared by the [place] FDA and  has been authorized for detection and/or diagnosis of SARS-CoV-2 by FDA under an Emergency Use Authorization (EUA).  This EUA will remain in effect (meaning this  test can be used) for the duration of  the COVID-19 declaration under Section 564(b)(1) of the Act, 21 U.S.C. section 360-bbb-3(b)(1), unless the authorization is terminated or revoked sooner.  Performed at [facility], [place] [address], [place],  [address]   Resp Panel by RT PCR (RSV, Flu A&B, Covid) - Nasopharyngeal Swab     Status: Abnormal   Collection Time: 09/27/20 12:29 PM   Specimen: Nasopharyngeal Swab  Result Value Ref Range Status   SARS Coronavirus 2 by RT PCR POSITIVE (A) NEGATIVE Final    Comment: RESULT CALLED  TO, READ BACK BY AND VERIFIED WITH: RN R AUGUSTUS [id number] AT 1404 BY CM (NOTE) SARS-CoV-2 target nucleic acids are DETECTED.  SARS-CoV-2 RNA is generally detectable in upper respiratory specimens  during the acute phase of infection. Positive results are  indicative of the presence of the identified virus, but do not rule out bacterial infection or co-infection with other pathogens not detected by the test. Clinical correlation with patient history and other diagnostic information is necessary to determine patient infection status. The expected result is Negative.  Fact Sheet for Patients:  PinkCheek.be  Fact Sheet for Healthcare Providers: GravelBags.it  This test is not yet approved or cleared by the [place] FDA and  has been authorized for detection and/or diagnosis of SARS-CoV-2 by FDA under an Emergency Use Authorization (EUA).  This EUA will remain in effect (meaning this test can be  used) for the duration of  the COVID-19 declaration under Section 564(b)(1) of the Act, 21 U.S.C. section 360bbb-3(b)(1), unless the authorization is terminated or revoked sooner.      Influenza A by PCR NEGATIVE NEGATIVE Final   Influenza B by PCR NEGATIVE NEGATIVE Final    Comment: (NOTE) The Xpert Xpress SARS-CoV-2/FLU/RSV assay is intended as an aid in  the diagnosis of influenza from Nasopharyngeal swab specimens and  should not be used as a sole basis for treatment. Nasal washings and  aspirates are unacceptable for Xpert Xpress SARS-CoV-2/FLU/RSV  testing.  Fact Sheet for Patients: PinkCheek.be  Fact Sheet for Healthcare Providers: GravelBags.it  This test is not yet approved or cleared by the [place] FDA and  has been authorized for detection and/or diagnosis of SARS-CoV-2 by  FDA under an Emergency Use Authorization (EUA). This EUA will remain  in effect (meaning this test can be used) for the duration of the  Covid-19 declaration under Section 564(b)(1) of the Act, 21  U.S.C. section 360bbb-3(b)(1), unless the authorization is  terminated or revoked.    Respiratory Syncytial Virus by PCR NEGATIVE  NEGATIVE Final    Comment: (NOTE) Fact Sheet for Patients: PinkCheek.be  Fact Sheet for Healthcare Providers: GravelBags.it  This test is not yet approved or cleared by the [place] FDA and  has been authorized for detection and/or diagnosis of SARS-CoV-2 by  FDA under an Emergency Use Authorization (EUA). This EUA will remain  in effect (meaning this test can be used) for the duration of the  COVID-19 declaration under Section 564(b)(1) of the Act, 21 U.S.C.  section 360bbb-3(b)(1), unless the authorization is terminated or  revoked. Performed at [facility], [place] [address], [place], [place] [address]   MRSA PCR Screening     Status: None   Collection Time: 09/28/20  8:30 AM   Specimen: Nasal Mucosa; Nasopharyngeal  Result Value Ref Range Status   MRSA by PCR NEGATIVE NEGATIVE Final    Comment:        The GeneXpert MRSA Assay (FDA approved for NASAL specimens only), is one component of a comprehensive MRSA colonization surveillance program. It is not intended to diagnose MRSA infection nor to guide or monitor treatment for MRSA infections. Performed at [facility], [place] [address] [place],  [address]     Coagulation Studies: No results for input(s): LABPROT, INR in the last 72 hours.  Urinalysis: No results for input(s): COLORURINE, LABSPEC, PHURINE, GLUCOSEU, HGBUR, BILIRUBINUR, KETONESUR, PROTEINUR, UROBILINOGEN, NITRITE, LEUKOCYTESUR in the last 72 hours.  Invalid input(s): APPERANCEUR    Imaging: [facility]  1 View  Result Date: 09/28/2020 CLINICAL DATA:  Shortness of breath.  COVID-19 positive EXAM: PORTABLE CHEST 1 VIEW COMPARISON:  September 24, 2020 FINDINGS: Central catheter tip is in the right atrium without pneumothorax. There is patchy airspace opacity in the right mid lung and lung base regions, slightly increased from most recent study. Heart size and pulmonary  vascularity are normal. No adenopathy. There is aortic atherosclerosis. Evidence of prior surgery or trauma involving each lateral clavicle noted. IMPRESSION: Patchy airspace opacity in the right mid lung and both base regions, likely due to atypical organism pneumonia. No consolidation. Increased opacity compared to most recent chest radiograph. Stable cardiac silhouette. Central catheter tip in right atrium without pneumothorax. Aortic Atherosclerosis (ICD10-I70.0). Electronically Signed   By: Lowella Grip III M.D.   On: 09/28/2020 08:45     Medications:   . sodium chloride    . sodium chloride    . sodium chloride    . famotidine (PEPCID) IV     . (feeding supplement) PROSource Plus  30 mL Oral QID  . amoxicillin-clavulanate  1 tablet Oral Daily  . apixaban  5 mg Oral BID  . Chlorhexidine Gluconate Cloth  6 each Topical Q0600  . doxycycline  100 mg Oral Q12H  . insulin aspart  0-15 Units Subcutaneous TID WC  . insulin aspart  0-5 Units Subcutaneous QHS  . midodrine  10 mg Oral TID WC  . multivitamin  1 tablet Oral QHS  . Ensure Max Protein  11 oz Oral BID  . traZODone  25 mg Oral QHS   sodium chloride, sodium chloride, sodium chloride, acetaminophen **OR** acetaminophen, albuterol, ALPRAZolam, alteplase, diphenhydrAMINE, EPINEPHrine, famotidine (PEPCID) IV, heparin, HYDROcodone-acetaminophen, HYDROmorphone (DILAUDID) injection, lidocaine (PF), lidocaine, lidocaine-prilocaine, melatonin, methylPREDNISolone (SOLU-MEDROL) injection, metoprolol tartrate, ondansetron (ZOFRAN) IV, pentafluoroprop-tetrafluoroeth, sodium chloride flush  Assessment/ Plan:  1.Acute kidney injury on CKD stage IIIb: Baseline creatinine level 1.3-1.9.  AKI multifactorial etiology including cardiorenal syndrome, ATN.  Started dialysis on 11/3 and has been tolerating well.  Tried high-dose IV diuretics without response.   No sign of renal recovery.  We had a meeting in the presence of palliative care on 11/9 with  the patient and his daughter. They agreed to dialysis and permanent access. RIJ TDC and left radiocephalic AV fistula created by Dr. Donnetta Hutching on 11/10. Last HD yesterday with 1.2 L UF.  He has OP HD unit arranged at [facility] clinic TTS schedule to arrive at 1225 PM. Status post HD on 11/13 with 2 L UF, next HD 10/01/2019    2.Covid positive, fever: The Covid was positive while checked for placement.  Is now febrile and hypotensive.  Plan for Covid antibody infusion per primary team.  3.CHF with systolic and diastolic dysfunction: Volume overload causing cardiorenal syndrome.  Did not respond with IV diuretics therefore now volume management with dialysis. Discontinued diuretics.  4.Hypertension: On metoprolol for A. fib.   Some labile blood pressures noted does continue on midodrine may be able to decrease this dose to as needed with dialysis  5. Sepsis with osteomyelitis of fifth toe on the right foot: On antibiotics per primary team.  6.Anemia due to sepsis, AKI: Hemoglobin at goal, iron saturation is low however I will hold IV iron because of infection and currently on antibiotics.  7.Metabolic acidosis: Managed with dialysis now.  8.Bleeding from tunnel catheter site: The temporary catheter is out.  No bleeding.  9.A. fib with RVR: No further bleeding, on BB and Eliquis.  LOS: [place] @TODAY @7 :21 AM

## 2020-09-30 ENCOUNTER — Telehealth: Payer: Self-pay | Admitting: Orthopedic Surgery

## 2020-09-30 DIAGNOSIS — M869 Osteomyelitis, unspecified: Secondary | ICD-10-CM | POA: Diagnosis not present

## 2020-09-30 LAB — GLUCOSE, CAPILLARY
Glucose-Capillary: 85 mg/dL (ref 70–99)
Glucose-Capillary: 102 mg/dL — ABNORMAL HIGH (ref 70–99)
Glucose-Capillary: 109 mg/dL — ABNORMAL HIGH (ref 70–99)
Glucose-Capillary: 104 mg/dL — ABNORMAL HIGH (ref 70–99)

## 2020-09-30 LAB — COMPREHENSIVE METABOLIC PANEL
ALT: 101 U/L — ABNORMAL HIGH (ref 0–44)
AST: 241 U/L — ABNORMAL HIGH (ref 15–41)
Albumin: 2.6 g/dL — ABNORMAL LOW (ref 3.5–5.0)
Alkaline Phosphatase: 57 U/L (ref 38–126)
Anion gap: 12 (ref 5–15)
BUN: 36 mg/dL — ABNORMAL HIGH (ref 8–23)
CO2: 26 mmol/L (ref 22–32)
Calcium: 8.1 mg/dL — ABNORMAL LOW (ref 8.9–10.3)
Chloride: 99 mmol/L (ref 98–111)
Creatinine, Ser: 5.73 mg/dL — ABNORMAL HIGH (ref 0.61–1.24)
GFR, Estimated: 10 mL/min — ABNORMAL LOW (ref >60)
Glucose, Bld: 93 mg/dL (ref 70–99)
Potassium: 3.9 mmol/L (ref 3.5–5.1)
Sodium: 137 mmol/L (ref 135–145)
Total Bilirubin: 0.7 mg/dL (ref 0.3–1.2)
Total Protein: 7.4 g/dL (ref 6.5–8.1)

## 2020-09-30 LAB — CBC WITH DIFFERENTIAL/PLATELET
Abs Immature Granulocytes: 0 10*3/uL (ref 0.00–0.07)
Basophils Absolute: 0 10*3/uL (ref 0.0–0.1)
Basophils Relative: 0 %
Eosinophils Absolute: 0 10*3/uL (ref 0.0–0.5)
Eosinophils Relative: 1 %
HCT: 34.6 % — ABNORMAL LOW (ref 39.0–52.0)
Hemoglobin: 10.6 g/dL — ABNORMAL LOW (ref 13.0–17.0)
Lymphocytes Relative: 11 %
Lymphs Abs: 0.5 10*3/uL — ABNORMAL LOW (ref 0.7–4.0)
MCH: 26.3 pg (ref 26.0–34.0)
MCHC: 30.6 g/dL (ref 30.0–36.0)
MCV: 85.9 fL (ref 80.0–100.0)
Monocytes Absolute: 0.3 10*3/uL (ref 0.1–1.0)
Monocytes Relative: 7 %
Neutro Abs: 3.9 10*3/uL (ref 1.7–7.7)
Neutrophils Relative %: 81 %
Platelets: 202 10*3/uL (ref 150–400)
RBC: 4.03 MIL/uL — ABNORMAL LOW (ref 4.22–5.81)
RDW: 15.8 % — ABNORMAL HIGH (ref 11.5–15.5)
WBC: 4.8 10*3/uL (ref 4.0–10.5)
nRBC: 0 % (ref 0.0–0.2)
nRBC: 0 /100 WBC

## 2020-09-30 LAB — PTH, INTACT AND CALCIUM
Calcium, Total (PTH): 8.3 mg/dL — ABNORMAL LOW (ref 8.6–10.2)
PTH: 43 pg/mL (ref 15–65)

## 2020-09-30 LAB — MAGNESIUM: Magnesium: 1.9 mg/dL (ref 1.7–2.4)

## 2020-09-30 LAB — PROCALCITONIN: Procalcitonin: 2.71 ng/mL

## 2020-09-30 LAB — C-REACTIVE PROTEIN: CRP: 9.1 mg/dL — ABNORMAL HIGH (ref <1.0)

## 2020-09-30 LAB — D-DIMER, QUANTITATIVE: D-Dimer, Quant: 3.73 ug/mL-FEU — ABNORMAL HIGH (ref 0.00–0.50)

## 2020-09-30 LAB — BRAIN NATRIURETIC PEPTIDE: B Natriuretic Peptide: 1023.4 pg/mL — ABNORMAL HIGH (ref 0.0–100.0)

## 2020-09-30 MED ORDER — CHLORHEXIDINE GLUCONATE CLOTH 2 % EX PADS
6.0000 | MEDICATED_PAD | Freq: Every day | CUTANEOUS | Status: DC
  Administered 2020-09-30: 6 via TOPICAL

## 2020-09-30 MED ORDER — METOPROLOL TARTRATE 12.5 MG HALF TABLET: 12.5000 mg | ORAL_TABLET | Freq: Two times a day (BID) | ORAL | Status: DC

## 2020-09-30 MED ORDER — AMIODARONE LOAD VIA INFUSION
150.0000 mg | Freq: Once | INTRAVENOUS | Status: DC
  Filled 2020-09-30 (×4): qty 83.34

## 2020-09-30 MED ORDER — AMIODARONE IV BOLUS ONLY 150 MG/100ML
150.0000 mg | Freq: Once | INTRAVENOUS | Status: AC
  Administered 2020-09-30: 150 mg via INTRAVENOUS
  Filled 2020-09-30: qty 100

## 2020-09-30 MED ORDER — AMIODARONE HCL IN DEXTROSE 360-4.14 MG/200ML-% IV SOLN
60.0000 mg/h | INTRAVENOUS | Status: AC
  Administered 2020-09-30: 60 mg/h via INTRAVENOUS
  Filled 2020-09-30: qty 200

## 2020-09-30 MED ORDER — ENSURE ENLIVE PO LIQD: 237.0000 mL | Freq: Three times a day (TID) | ORAL | Status: DC

## 2020-09-30 MED ORDER — AMIODARONE HCL IN DEXTROSE 360-4.14 MG/200ML-% IV SOLN
30.0000 mg/h | INTRAVENOUS | Status: DC
  Administered 2020-09-30 – 2020-10-01 (×2): 30 mg/h via INTRAVENOUS
  Filled 2020-09-30 (×5): qty 200

## 2020-09-30 NOTE — Progress Notes (Signed)
   09/29/20 2124  Assess: MEWS Score  Temp 98.1 F (36.7 C)  BP 99/60  Pulse Rate (!) 118  ECG Heart Rate (!) 118  Resp 20  SpO2 94 %  O2 Device Room Air  Patient Activity (if Appropriate) In bed  Assess: MEWS Score  MEWS Temp 0  MEWS Systolic 1  MEWS Pulse 2  MEWS RR 0  MEWS LOC 0  MEWS Score 3  MEWS Score Color Yellow  Assess: if the MEWS score is Yellow or Red  Were vital signs taken at a resting state? Yes  Focused Assessment No change from prior assessment  Early Detection of Sepsis Score *See Row Information* Low  MEWS guidelines implemented *See Row Information* Yes  Treat  Pain Scale 0-10  Pain Score 4  Pain Type Acute pain  Pain Location Leg  Notify: Charge Nurse/RN  Name of Charge Nurse/RN Notified Vicente Males RN  Date Charge Nurse/RN Notified 09/29/20  Time Charge Nurse/RN Notified 2130  Document  Progress note created (see row info) Yes

## 2020-09-30 NOTE — Progress Notes (Signed)
PROGRESS NOTE    Chardon.  CBS:[id number] DOB: 1951/03/02 DOA: 09/08/2020 PCP: Aura Dials, MD   Chief Complaint  Patient presents with  . Wound Infection    Brief Narrative:  69 year old male with [id number], chronic systolic and diastolic CHF, PAF supposed to be on Eliquis, HTN, morbid obesity brought to ED 09/08/2020 after he was found down in his house.  Reportedly patient was followed.  Protective services, has very unsanitary living condition at home and was found down in the bathroom during welfare check. Per report: Patient reports falling while standing up from bathroom without LOC, resulting in fall onto R chest and resultant R lower chest pain. Patient was found down and brought into ED. Due to his poor living conditions his daughter was involved in his care, he is Noncompliant in general and very self-neglected with denial component.   He was found to have necrotic foot infections and underwent xray of right foot, which revealed subcortical lucency involving 5th digit concerning for OM.  Had massive lower extremity edema and apparently had stopped taking torsemide several weeks ago as he did not think he needed it.  Has been seen by general surgery, psychiatry along with Dr. Sharol Given orthopedic surgery, amputation of the right fifth digit was offered but patient declined, he is currently being treated with Augmentin and doxycycline with plans of being on chronic doxycycline post discharge and follow-up with Dr. Sharol Given.  Was started on diuretics this admission unfortunately his renal function continued to decline, he was seen by nephrology and now has progressed to ESRD and is HD dependent, underwent right IJ tunneled hemodialysis catheter placement and left radiocephalic AV fistula creation by Dr. Donnetta Hutching  He needed a Covid PCR test for SNF placement & also spiked a low grade fever on early 09/27/2020 after which he was tested for COVID-19 and he was positive for the same with some  x-ray evidence of pneumonia, of note patient was unvaccinated for COVID-19 till he was hospitalized and got his first vaccine shot on 09/16/2020 with not enough time to mount immunity.  He was transferred to my care on 09/28/2020 on day 20 of his hospital stay.  He is stable after receiving antibody infusion from Covid standpoint.  He is being getting ready for home discharge, outpatient dialysis is set, he was to be discharged on 09/30/2020 unfortunately went into RVR requiring amiodarone drip.  Cardiology consulted.  If rate better can be discharged on 10/01/2020.   Subjective: Patient in bed, appears comfortable, denies any headache, no fever, no chest pain or pressure, no shortness of breath , no abdominal pain. No focal weakness.  Does not feel any palpitations.    Assessment & Plan:  Incidental Covid positive test during hospitalization.  SNF placement related COVID-19 test along with low-grade fever early morning 09/27/2020 showing positive COVID-19 infection diagnosed in the hospital on 09/27/2020, x-ray showing interstitial pattern as well.  Patient was not previously vaccinated but got his Covid vaccine while in this hospital however for short was given on 09/16/2020.  Still does not have full immunity for it.  Patient is currently asymptomatic from pulmonary standpoint, he has consented for Covid antibody infusion, daughter informed as well.  If worse he will get steroids and Remdesivir.  Unfortunately due to active lower extremity infections he is not a candidate for Actemra or Baricitinib.  This plan was clearly given to patient and his daughter Oswaldo Milian, daughter [name] on 09/28/2020 requested that patient be given ivermectin, she was  told that this is not the hospital COVID-19 protocol and we cannot use ivermectin as we do not have any positive experience with it at this facility.  She also informed me that she is a Therapist, sports and that Covid Vaccines are known to give Covid 19 infections, also some  studies are showing that remdesivir is causing a lot of harm to lungs, also informed that such studies have not been reviewed by our COVID-19 team.   Patient's daughter had confrontation with the security staff on 09/27/2020 after which which she had to be escorted out of the hospital premises, she is currently not allowed to visit the patient as per hospital security staff.  Patient's daughter has submitted a prefilled FMLA form on 09/28/2020, she was informed that I cannot sign a form that was almost entirely filled entirely by her for the information that has to be provided by the medical provider, this form needs to be filled by the physician as deemed appropriate.   Recent Labs  Lab 09/26/20 0144 09/27/20 1003 09/27/20 1229 09/28/20 0545 09/28/20 0811 09/29/20 0238 09/29/20 0307 09/29/20 2230 09/30/20 0105  WBC 8.6  --   --  6.1  --  5.3  --  5.1 4.8  HGB 10.6*  --   --  10.9*  --  11.3*  --  11.5* 10.6*  HCT 34.0*  --   --  35.3*  --  36.1*  --  36.4* 34.6*  PLT 193  --   --  195  --  212  --  205 202  CRP  --   --   --   --  8.4* 10.2*  --   --  9.1*  BNP  --   --   --   --  1,399.4* 1,307.5*  --   --  1,023.4*  DDIMER  --   --   --   --  3.02* 3.45*  --   --  3.73*  PROCALCITON  --   --   --   --  1.81 2.50  --   --  2.71  AST  --   --   --   --   --  234*  --   --  241*  ALT  --   --   --   --   --  89*  --   --  101*  ALKPHOS  --   --   --   --   --  58  --   --  57  BILITOT  --   --   --   --   --  0.6  --   --  0.7  ALBUMIN 3.1*  --   --  2.9*  --  2.8* 2.8*  --  2.6*  SARSCOV2NAA  --  POSITIVE* POSITIVE*  --   --   --   --   --   --      PAF with RVR [place] vas 2 score of greater than 3: Blood pressure too low for beta-blocker any other traditional rate lowering agents.  Continue Eliquis for anticoagulation pharmacy dosing, went into RVR on 09/30/2020 with chronic severe hypotension, start him on amiodarone.  Cardiology reconsulted on 09/30/2020.  Acute on chronic  combined systolic and diastolic CHF: LVEF 01-02% in LVEF, previously back in March EF was 40-45%: TTE shows lvef 30-35%, severe concentric LVH, global hypokinesia.  Now dialysis dependent and volume will be managed in dialysis.  Blood pressure  and renal function prohibits the use of beta-blocker, ACE/ARB or Entresto.  AKI on CKD stage III with baseline creatinine 1.3-1.9 now ESRD: Seen by Nephrology - etiology multifactorial including cardiorenal syndrome, ATN.  Now on dialysis since 11/3 and tolerating well, at times episode of hypotension and is started on midodrine.  S/P Rtt IJ tunneled HD  catheter placement and left radiocephalic AV fistula creation by Dr. Donnetta Hutching 11/10.  Continue dialysis per Renal.   Extensive bilateral lower extremity edema wound on right foot,Osteomyelitis of fifth toe of right foot per x-ray, ABI indeterminate due to lower extremity edema.  Seen by Dr. Sharol Given offered amputation but patient refused.  Family agreed with patient decision not to pursue amputation.  Plan is to continue aggressive wound care, outpatient doxycycline long-term, follow-up with Dr. Sharol Given as outpatient, continue wound care, patient clearly understands the risk of not undergoing surgical amputation and that it can make him septic and can cause death and disability.  Hypotension: BP soft, continue midodrine. Likely multifactorial, including low EF.  Chronic lymphedema: Continue Una boot, close wound care follow-up.  Fluid removal via HD.  Falls at home/neuropathy: Continue PT OT, will need skilled nursing facility placement vs HHPT.  Patient prefers home health at this time.  Severe sepsis POA: Sepsis physiology has resolved.  Acute encephalopathy, uremic/septic on presentation per chart.  Resolved.  Hematuria after placing foley catheter suspect catheter related trauma: Now resolved Foley removed 09/23/2020.  Morbid obesity with BMI 43>41, he will benefit with weight loss healthy lifestyle outpatient sleep  apnea evaluation but I am not sure how much compliant of feasible it is for him.  Type 2 diabetes mellitus: A1c uncontrolled 9.6 10/25.  Blood sugar is controlled on sliding scale.  Blood sugar stable on sliding scale insulin  Recent Labs  Lab 09/28/20 2216 09/29/20 0732 09/29/20 1148 09/29/20 2135 09/30/20 0719  GLUCAP 120* 101* 112* 87 85      Nutrition: Diet Order            Diet renal with fluid restriction Fluid restriction: 1200 mL Fluid; Room service appropriate? Yes; Fluid consistency: Thin  Diet effective now                 Nutrition Problem: Increased nutrient needs Etiology: wound healing, acute illness Signs/Symptoms: estimated needs Interventions: MVI, Other (Comment) (Prosource Plus)  Body mass index is 38.81 kg/m.  Pressure Ulcer: Pressure Injury 09/08/20 Heel Right Stage 3 -  Full thickness tissue loss. Subcutaneous fat may be visible but bone, tendon or muscle are NOT exposed. 25% red, 75% slouogh, strong odor, mod brown (Active)  09/08/20   Location: Heel  Location Orientation: Right  Staging: Stage 3 -  Full thickness tissue loss. Subcutaneous fat may be visible but bone, tendon or muscle are NOT exposed.  Wound Description (Comments): 25% red, 75% slouogh, strong odor, mod brown  Present on Admission: Yes     Pressure Injury 09/17/20 Buttocks Right;Left Deep Tissue Pressure Injury - Purple or maroon localized area of discolored intact skin or blood-filled blister due to damage of underlying soft tissue from pressure and/or shear. (Active)  09/17/20 1805  Location: Buttocks  Location Orientation: Right;Left  Staging: Deep Tissue Pressure Injury - Purple or maroon localized area of discolored intact skin or blood-filled blister due to damage of underlying soft tissue from pressure and/or shear.  Wound Description (Comments):   Present on Admission:      DVT prophylaxis: Eliquis  Code Status:  Code Status: Full Code  Family Communication: plan  of care discussed with patient.  Daughters have been updated multiple times.  Family meeting completed 09/23/2020 at bedside along with nephrology and palliative care be RN.    Updated daughter 09/28/2020, 09/29/2020, 09/30/20  Status YB:[id number] Remains inpatient appropriate because:IV treatments appropriate due to intensity of illness or inability to take PO and Inpatient level of care appropriate due to severity of illness  Dispo:The patient is from: Home            Anticipated d/c is to: SNF            Anticipated d/c date is: Once SNF and Outpatient dialysis has been arranged & he is stable from Covid standpoint.             Patient currently is not medically stable to d/c.   Consultants: Cards, ortho, Renal Procedures:see note  Culture/Microbiology    Component Value Date/Time   SDES  09/08/2020 2103    BLOOD LEFT HAND Performed at [facility], [place] [address] [place], [place] [address]    SPECREQUEST  09/08/2020 2103    BOTTLES DRAWN AEROBIC AND ANAEROBIC Blood Culture adequate volume Performed at [facility] [address] [place], [place] [address]    CULT  09/08/2020 2103    NO GROWTH 5 DAYS Performed at [facility] [address], [place], [place] [address]    REPTSTATUS 09/13/2020 FINAL 09/08/2020 2103    Other culture-see note  Medications:  Scheduled Meds: . (feeding supplement) PROSource Plus  30 mL Oral QID  . amiodarone  150 mg Intravenous Once  . apixaban  5 mg Oral BID  . Chlorhexidine Gluconate Cloth  6 each Topical Q0600  . doxycycline  100 mg Oral Q12H  . insulin aspart  0-15 Units Subcutaneous TID WC  . insulin aspart  0-5 Units Subcutaneous QHS  . multivitamin  1 tablet Oral QHS  . Ensure Max Protein  11 oz Oral BID  . traZODone  25 mg Oral QHS   Continuous Infusions: . sodium chloride    . sodium chloride    . famotidine (PEPCID) IV      Antimicrobials:  Anti-infectives (From admission,  onward)   Start     Dose/Rate Route Frequency Ordered Stop   09/17/20 1000  amoxicillin-clavulanate (AUGMENTIN) 500-125 MG per tablet 500 mg  Status:  Discontinued        1 tablet Oral Daily 09/17/20 0839 09/30/20 0907   09/14/20 2200  amoxicillin-clavulanate (AUGMENTIN) 500-125 MG per tablet 500 mg  Status:  Discontinued        1 tablet Oral 2 times daily 09/14/20 1524 09/17/20 0839   09/12/20 1000  amoxicillin-clavulanate (AUGMENTIN) 875-125 MG per tablet 1 tablet  Status:  Discontinued        1 tablet Oral Every 12 hours 09/12/20 0823 09/14/20 1524   09/12/20 1000  doxycycline (VIBRA-TABS) tablet 100 mg        100 mg Oral Every 12 hours 09/12/20 0823     09/10/20 2000  DAPTOmycin (CUBICIN) 740 mg in sodium chloride 0.9 % IVPB  Status:  Discontinued        740 mg 229.6 mL/hr over 30 Minutes Intravenous Daily 09/10/20 1230 09/12/20 0822   09/10/20 1200  metroNIDAZOLE (FLAGYL) IVPB 500 mg  Status:  Discontinued        500 mg 100 mL/hr over 60 Minutes Intravenous Every 8 hours 09/10/20 1050 09/12/20 0822   09/10/20 1132  vancomycin variable dose per unstable renal function (pharmacist dosing)  Status:  Discontinued         Does not apply See admin instructions 09/10/20 1132 09/10/20 1230   09/09/20 1700  vancomycin (VANCOREADY) IVPB 1500 mg/300 mL  Status:  Discontinued        1,500 mg 150 mL/hr over 120 Minutes Intravenous Every 24 hours 09/08/20 1956 09/10/20 1132   09/09/20 1000  ceFEPIme (MAXIPIME) 2 g in sodium chloride 0.9 % 100 mL IVPB  Status:  Discontinued        2 g 200 mL/hr over 30 Minutes Intravenous Every 12 hours 09/08/20 1947 09/12/20 0822   09/08/20 2200  ceFEPIme (MAXIPIME) 2 g in sodium chloride 0.9 % 100 mL IVPB        2 g 200 mL/hr over 30 Minutes Intravenous  Once 09/08/20 1754 09/08/20 2206   09/08/20 1800  vancomycin (VANCOCIN) IVPB 1000 mg/200 mL premix  Status:  Discontinued        1,000 mg 200 mL/hr over 60 Minutes Intravenous  Once 09/08/20 1754 09/08/20 1756     09/08/20 1630  vancomycin (VANCOREADY) IVPB 2000 mg/400 mL        2,000 mg 200 mL/hr over 120 Minutes Intravenous  Once 09/08/20 1551 09/08/20 1842   09/08/20 1600  piperacillin-tazobactam (ZOSYN) IVPB 3.375 g        3.375 g 100 mL/hr over 30 Minutes Intravenous  Once 09/08/20 1549 09/08/20 1645     Objective: Vitals: Today's Vitals   09/30/20 0725 09/30/20 0800 09/30/20 0810 09/30/20 0828  BP: 101/78 (!) 79/65 95/77 98/76   Pulse: (!) 118 (!) 118 (!) 134 (!) 155  Resp:  20  (!) 21  Temp:    98.1 F (36.7 C)  TempSrc:    Axillary  SpO2: 94% 91% 96% 93%  Weight:      Height:      PainSc:   0-No pain     Intake/Output Summary (Last 24 hours) at 09/30/2020 0909 Last data filed at 09/30/2020 0800 Gross per 24 hour  Intake 730 ml  Output 1500 ml  Net -770 ml   Filed Weights   09/28/20 0500 09/29/20 1515 09/29/20 1815  Weight: 120.7 kg 120.7 kg 119.2 kg   Weight change:   Intake/Output from previous day: 11/15 0701 - 11/16 0700 In: 730 [P.O.:730] Out: 1500  Intake/Output this shift: Total I/O In: 120 [P.O.:120] Out: -   Examination:  Awake Alert, No new F.N deficits, Normal affect [place].AT,PERRAL Supple Neck,No JVD, No cervical lymphadenopathy appriciated.  Symmetrical Chest wall movement, Good air movement bilaterally, CTAB iRRR,No Gallops, Rubs or new Murmurs, No Parasternal Heave +ve B.Sounds, Abd Soft, No tenderness, No organomegaly appriciated, No rebound - guarding or rigidity.  Leg wound pictures are below -            Data Reviewed: I have personally reviewed following labs and imaging studies -  CBC:  Recent Labs  Lab 09/26/20 0144 09/28/20 0545 09/29/20 0238 09/29/20 2230 09/30/20 0105  WBC 8.6 6.1 5.3 5.1 4.8  NEUTROABS  --   --  3.9  --  3.9  HGB 10.6* 10.9* 11.3* 11.5* 10.6*  HCT 34.0* 35.3* 36.1* 36.4* 34.6*  MCV 85.6 85.3 85.1 85.0 85.9  PLT 193 195 212 205 242   Basic Metabolic Panel: Recent Labs  Lab 09/26/20 0144  09/28/20 0545 09/28/20 0811 09/29/20 0238 09/29/20 0307 09/30/20 0105  NA 136 138  --  134* 137 137  K  3.9 3.5  --  3.4* 3.5 3.9  CL 101 101  --  101 100 99  CO2 22 21*  --  20* 16* 26  GLUCOSE 140* 97  --  106* 96 93  BUN 60* 53*  --  46* 45* 36*  CREATININE 6.79* 7.23*  --  6.61* 6.66* 5.73*  CALCIUM 8.3* 8.5*  --  8.2* 8.4* 8.1*  MG  --   --  1.8 2.1  --  1.9  PHOS 6.9* 6.4*  --   --  6.0*  --    GFR: Estimated Creatinine Clearance: 15.1 mL/min (A) (by C-G formula based on SCr of 5.73 mg/dL (H)). Liver Function Tests: Recent Labs  Lab 09/26/20 0144 09/28/20 0545 09/29/20 0238 09/29/20 0307 09/30/20 0105  AST  --   --  234*  --  241*  ALT  --   --  89*  --  101*  ALKPHOS  --   --  58  --  57  BILITOT  --   --  0.6  --  0.7  PROT  --   --  7.8  --  7.4  ALBUMIN 3.1* 2.9* 2.8* 2.8* 2.6*   No results for input(s): LIPASE, AMYLASE in the last 168 hours. No results for input(s): AMMONIA in the last 168 hours. Coagulation Profile: No results for input(s): INR, PROTIME in the last 168 hours. Cardiac Enzymes: No results for input(s): CKTOTAL, CKMB, CKMBINDEX, TROPONINI in the last 168 hours. BNP (last 3 results) No results for input(s): PROBNP in the last 8760 hours. HbA1C: No results for input(s): HGBA1C in the last 72 hours. CBG: Recent Labs  Lab 09/28/20 2216 09/29/20 0732 09/29/20 1148 09/29/20 2135 09/30/20 0719  GLUCAP 120* 101* 112* 87 85   Lipid Profile: No results for input(s): CHOL, HDL, LDLCALC, TRIG, CHOLHDL, LDLDIRECT in the last 72 hours. Thyroid Function Tests: No results for input(s): TSH, T4TOTAL, FREET4, T3FREE, THYROIDAB in the last 72 hours. Anemia Panel: No results for input(s): VITAMINB12, FOLATE, FERRITIN, TIBC, IRON, RETICCTPCT in the last 72 hours. Sepsis Labs: Recent Labs  Lab 09/28/20 0811 09/29/20 0238 09/30/20 0105  PROCALCITON 1.81 2.50 2.71    Recent Results (from the past 240 hour(s))  SARS Coronavirus 2 by RT PCR  (hospital order, performed in [facility] hospital lab) Nasopharyngeal Nasopharyngeal Swab     Status: Abnormal   Collection Time: 09/27/20 10:03 AM   Specimen: Nasopharyngeal Swab  Result Value Ref Range Status   SARS Coronavirus 2 POSITIVE (A) NEGATIVE Final    Comment: RESULT CALLED TO, READ BACK BY AND VERIFIED WITH: RN C APPLLEWHITE [id number] AT 1143 BY CM (NOTE) SARS-CoV-2 target nucleic acids are DETECTED  SARS-CoV-2 RNA is generally detectable in upper respiratory specimens  during the acute phase of infection.  Positive results are indicative  of the presence of the identified virus, but do not rule out bacterial infection or co-infection with other pathogens not detected by the test.  Clinical correlation with patient history and  other diagnostic information is necessary to determine patient infection status.  The expected result is negative.  Fact Sheet for Patients:   StrictlyIdeas.no   Fact Sheet for Healthcare Providers:   BankingDealers.co.za    This test is not yet approved or cleared by the [place] FDA and  has been authorized for detection and/or diagnosis of SARS-CoV-2 by FDA under an Emergency Use Authorization (EUA).  This EUA will remain in effect (meaning this  test can be used)  for the duration of  the COVID-19 declaration under Section 564(b)(1) of the Act, 21 U.S.C. section 360-bbb-3(b)(1), unless the authorization is terminated or revoked sooner.  Performed at [facility], [place] [address], [place], [place] [address]   Resp Panel by RT PCR (RSV, Flu A&B, Covid) - Nasopharyngeal Swab     Status: Abnormal   Collection Time: 09/27/20 12:29 PM   Specimen: Nasopharyngeal Swab  Result Value Ref Range Status   SARS Coronavirus 2 by RT PCR POSITIVE (A) NEGATIVE Final    Comment: RESULT CALLED TO, READ BACK BY AND VERIFIED WITH: RN R AUGUSTUS [id number] AT 1404 BY CM (NOTE) SARS-CoV-2 target nucleic acids  are DETECTED.  SARS-CoV-2 RNA is generally detectable in upper respiratory specimens  during the acute phase of infection. Positive results are indicative of the presence of the identified virus, but do not rule out bacterial infection or co-infection with other pathogens not detected by the test. Clinical correlation with patient history and other diagnostic information is necessary to determine patient infection status. The expected result is Negative.  Fact Sheet for Patients:  PinkCheek.be  Fact Sheet for Healthcare Providers: GravelBags.it  This test is not yet approved or cleared by the [place] FDA and  has been authorized for detection and/or diagnosis of SARS-CoV-2 by FDA under an Emergency Use Authorization (EUA).  This EUA will remain in effect (meaning this test can be  used) for the duration of  the COVID-19 declaration under Section 564(b)(1) of the Act, 21 U.S.C. section 360bbb-3(b)(1), unless the authorization is terminated or revoked sooner.      Influenza A by PCR NEGATIVE NEGATIVE Final   Influenza B by PCR NEGATIVE NEGATIVE Final    Comment: (NOTE) The Xpert Xpress SARS-CoV-2/FLU/RSV assay is intended as an aid in  the diagnosis of influenza from Nasopharyngeal swab specimens and  should not be used as a sole basis for treatment. Nasal washings and  aspirates are unacceptable for Xpert Xpress SARS-CoV-2/FLU/RSV  testing.  Fact Sheet for Patients: PinkCheek.be  Fact Sheet for Healthcare Providers: GravelBags.it  This test is not yet approved or cleared by the [place] FDA and  has been authorized for detection and/or diagnosis of SARS-CoV-2 by  FDA under an Emergency Use Authorization (EUA). This EUA will remain  in effect (meaning this test can be used) for the duration of the  Covid-19 declaration under Section 564(b)(1) of the  Act, 21  U.S.C. section 360bbb-3(b)(1), unless the authorization is  terminated or revoked.    Respiratory Syncytial Virus by PCR NEGATIVE NEGATIVE Final    Comment: (NOTE) Fact Sheet for Patients: PinkCheek.be  Fact Sheet for Healthcare Providers: GravelBags.it  This test is not yet approved or cleared by the [place] FDA and  has been authorized for detection and/or diagnosis of SARS-CoV-2 by  FDA under an Emergency Use Authorization (EUA). This EUA will remain  in effect (meaning this test can be used) for the duration of the  COVID-19 declaration under Section 564(b)(1) of the Act, 21 U.S.C.  section 360bbb-3(b)(1), unless the authorization is terminated or  revoked. Performed at [facility], [place] [address] [place], [place] [address]   MRSA PCR Screening     Status: None   Collection Time: 09/28/20  8:30 AM   Specimen: Nasal Mucosa; Nasopharyngeal  Result Value Ref Range Status   MRSA by PCR NEGATIVE NEGATIVE Final    Comment:        The GeneXpert MRSA Assay (  FDA approved for NASAL specimens only), is one component of a comprehensive MRSA colonization surveillance program. It is not intended to diagnose MRSA infection nor to guide or monitor treatment for MRSA infections. Performed at [facility], [place] [address], [place], [place] [address]      Radiology Studies:  No results found.   LOS: 22 days   Lala Lund, MD [facility]  09/30/2020, 9:09 AM

## 2020-09-30 NOTE — Care Management Important Message (Signed)
Important Message  Patient Details  Name: [name]. MRN: [id number] Date of Birth: [birth year]   Medicare Important Message Given:  Yes     Barb Merino Goulding 09/30/2020, 12:52 PM

## 2020-09-30 NOTE — Progress Notes (Signed)
Paged Dr. Myna Hidalgo regarding pt's heart rate being elevated between 130's-160's and blood pressure reading 122/100 on monitor and 110/60 manual. Dr. Myna Hidalgo ordered an ekg to see if pt was in Afib. Was instructed ok to give PRN metoprolol if patient was in Afib. EKG was completed and confirmed pt was in Afib. 2.5mg /ml metoprolol given via IV. Pt appears to be resting comfortably.

## 2020-09-30 NOTE — Consult Note (Signed)
CARDIOLOGY CONSULT NOTE  Patient ID: [name]. MRN: [id number] DOB/AGE: 04/07/1951 69 y.o.  Admit date: 09/08/2020 Referring Physician: [facility] hospitalist Reason for Consultation:  Afib  HPI:   69 y.o. Caucasian male  with hypertension, uncontrolled type 2 diabetes mellitus, morbid obesity, persistent Afib, now admitted after a fall at home, found to have Rt foot osetomyelitis, progressed to ESRD. Cardiology consulted for management of Afib w/RVR.  Patient has been hospitalized since 09/08/2020 after a mechanical fall at home, he was found to be in unsanitary conditions during welfare check. Since then, multiple medical comorbidities have prolonged hospital stay, as above. Pending nursing home placement, Covid test was checked which came back positive on 09/27/2020. He currently denies any symptoms and repeats multiple times that he "just wants to get home".   He is currently on IV amiodarone for rate/rhythm control, and Eliquis 5 mg twice daily for anticoagulation. He has been on anticoagulation with apixaban since 09/25/2020. Prior to that, he was anticoagulation intermittently, owing to acute kidney injury and dialysis access placement. He was very likely not taking Eliquis while at home.  He was on metoprolol before, with noted episodes of hypotension. Currently, he is not hypotensive. Ventricular rate is 110-130 bpm while in A. Fib.  I had a long conversation with his daughter and HCPOA [name] over the phone. She is concerned about his decision making ability and ability to understand complex medical discussions. All he wants to do is "go home". Daughter [name], who has been an ICU nurse, wants to take care of him at home and take him to outpatient dialysis. She knows that his prognosis is not good and they are prepared for the eventuality.     Past Medical History:  Diagnosis Date  . Arthritis   . Back pain    occasionally  . BPH (benign prostatic hypertrophy)    takes Uroxatral  daily as well as Finasteride   . Diabetes mellitus without complication ([place])    takes Metformin daily  . Diabetic foot infection ([place]) 10/22/2019  . GERD (gastroesophageal reflux disease)    no meds  . Hepatitis hx of   at age 19  . Hypertension    takes Losartan and HCTZ daily  . Joint pain   . Lymphedema 10/22/2019  . Urinary frequency   . Urinary urgency   . Venous stasis dermatitis 11/19/2019     Past Surgical History:  Procedure Laterality Date  . APPENDECTOMY     age 27  . AV FISTULA PLACEMENT Left 09/24/2020   Procedure: CREATION OF LEFT ARM RADIOCEPHALIC ARTERIOVENOUS (AV) FISTULA;  Surgeon: Rosetta Posner, MD;  Location: [facility];  Service: Vascular;  Laterality: Left;  . COLONOSCOPY    . INSERTION OF DIALYSIS CATHETER Right 09/24/2020   Procedure: INSERTION OF RIGHT INTERNAL JUGULAR TUNNELED DIALYSIS CATHETER;  Surgeon: Rosetta Posner, MD;  Location: [facility];  Service: Vascular;  Laterality: Right;  . IR FLUORO GUIDE CV LINE RIGHT  09/16/2020  . IR US GUIDE VASC ACCESS RIGHT  09/16/2020  . KNEE ARTHROSCOPY  1984   right and left   . SHOULDER ARTHROSCOPY  2003   right [facility]-stayed overnight-had SOB  . SHOULDER ARTHROSCOPY WITH ROTATOR CUFF REPAIR AND SUBACROMIAL DECOMPRESSION Left 03/13/2013   Procedure: LEFT SHOULDER ARTHROSCOPY WITH SUBACROMIAL DECOMPRESSION, DISTAL CLAVICLE RESECTION AND REPAIR ROTATOR CUFF AND LABRAL DEBRIDEMENT;  Surgeon: [name]., MD;  Location: [facility];  Service: Orthopedics;  Laterality: Left;  . TOTAL  HIP ARTHROPLASTY Right 01/29/2015   Procedure: TOTAL HIP ARTHROPLASTY ANTERIOR APPROACH;  Surgeon: [name], MD;  Location: [facility];  Service: Orthopedics;  Laterality: Right;  Marland Kitchen VASECTOMY    . VASECTOMY REVERSAL  1993      Family History  Problem Relation Age of Onset  . Benign prostatic hyperplasia Father   . Kidney cancer Neg Hx   . Kidney disease Neg Hx      Social History: Social History   Socioeconomic  History  . Marital status: Married    Spouse name: Not on file  . Number of children: 3  . Years of education: Not on file  . Highest education level: Not on file  Occupational History  . Not on file  Tobacco Use  . Smoking status: Never Smoker  . Smokeless tobacco: Former Systems developer    Types: Secondary school teacher  . Vaping Use: Never used  Substance and Sexual Activity  . Alcohol use: Yes    Alcohol/week: 0.0 standard drinks    Comment: occasionally beer or scotch  . Drug use: No  . Sexual activity: Yes  Other Topics Concern  . Not on file  Social History Narrative  . Not on file   Social Determinants of Health   Financial Resource Strain:   . Difficulty of Paying Living Expenses: Not on file  Food Insecurity:   . Worried About Charity fundraiser in the Last Year: Not on file  . Ran Out of Food in the Last Year: Not on file  Transportation Needs:   . Lack of Transportation (Medical): Not on file  . Lack of Transportation (Non-Medical): Not on file  Physical Activity:   . Days of Exercise per Week: Not on file  . Minutes of Exercise per Session: Not on file  Stress:   . Feeling of Stress : Not on file  Social Connections:   . Frequency of Communication with Friends and Family: Not on file  . Frequency of Social Gatherings with Friends and Family: Not on file  . Attends Religious Services: Not on file  . Active Member of Clubs or Organizations: Not on file  . Attends Archivist Meetings: Not on file  . Marital Status: Not on file  Intimate Partner Violence:   . Fear of Current or Ex-Partner: Not on file  . Emotionally Abused: Not on file  . Physically Abused: Not on file  . Sexually Abused: Not on file     Medications Prior to Admission  Medication Sig Dispense Refill Last Dose  . apixaban (ELIQUIS) 5 MG TABS tablet Take 1 tablet (5 mg total) by mouth 2 (two) times daily. (Patient not taking: Reported on 09/08/2020) 60 tablet 0 Not Taking at Unknown time  .  metoprolol tartrate (LOPRESSOR) 50 MG tablet Take 1 tablet (50 mg total) by mouth 2 (two) times daily for 30 doses. (Patient not taking: Reported on 09/08/2020) 60 tablet 3 Not Taking at Unknown time  . potassium chloride SA (KLOR-CON) 20 MEQ tablet Take 1 tablet by mouth daily (Patient not taking: Reported on 09/08/2020) 90 tablet 1 Not Taking at Unknown time  . torsemide (DEMADEX) 20 MG tablet Take 2 tablets (40 mg total) by mouth 2 (two) times daily. (Patient not taking: Reported on 09/08/2020) 120 tablet 0 Not Taking at Unknown time    Review of Systems  Constitutional: Negative for decreased appetite, malaise/fatigue, weight gain and weight loss.       "I feel fine"  HENT: Negative for congestion.   Eyes: Negative for visual disturbance.  Cardiovascular: Negative for chest pain, dyspnea on exertion, leg swelling, palpitations and syncope.  Respiratory: Negative for cough.   Endocrine: Negative for cold intolerance.  Hematologic/Lymphatic: Does not bruise/bleed easily.  Skin: Negative for itching and rash.  Musculoskeletal: Negative for myalgias.  Gastrointestinal: Negative for abdominal pain, nausea and vomiting.  Genitourinary: Negative for dysuria.  Neurological: Negative for dizziness and weakness.  Psychiatric/Behavioral: The patient is not nervous/anxious.   All other systems reviewed and are negative.     Physical Exam: Physical Exam Vitals and nursing note reviewed.  Constitutional:      General: He is not in acute distress.    Appearance: He is well-developed.  HENT:     Head: Normocephalic and atraumatic.  Eyes:     Conjunctiva/sclera: Conjunctivae normal.     Pupils: Pupils are equal, round, and reactive to light.  Neck:     Vascular: No JVD.     Comments: Detailed exam deferred in COVID positive patient   Cardiovascular:     Pulses: Intact distal pulses.     Comments: Detailed exam deferred in COVID positive patient Pulmonary:     Effort: Pulmonary effort  is normal.  Abdominal:     Comments: Detailed exam deferred in COVID positive patient   Musculoskeletal:        General: No tenderness. Normal range of motion.     Left lower leg: No edema.  Skin:    Comments: Detailed exam deferred in COVID positive patient    Neurological:     Mental Status: He is alert and oriented to person, place, and time.      Labs:   Lab Results  Component Value Date   WBC 4.8 09/30/2020   HGB 10.6 (L) 09/30/2020   HCT 34.6 (L) 09/30/2020   MCV 85.9 09/30/2020   PLT 202 09/30/2020    Recent Labs  Lab 09/30/20 0105  NA 137  K 3.9  CL 99  CO2 26  BUN 36*  CREATININE 5.73*  CALCIUM 8.1*  PROT 7.4  BILITOT 0.7  ALKPHOS 57  ALT 101*  AST 241*  GLUCOSE 93    Lipid Panel  No results found for: CHOL, TRIG, HDL, CHOLHDL, VLDL, LDLCALC  BNP (last 3 results) Recent Labs    09/28/20 0811 09/29/20 0238 09/30/20 0105  BNP 1,399.4* 1,307.5* 1,023.4*    HEMOGLOBIN A1C Lab Results  Component Value Date   HGBA1C 9.6 (H) 09/08/2020   MPG 229 09/08/2020    Cardiac Panel (last 3 results) Recent Labs    09/14/20 1222  CKTOTAL 41*    Lab Results  Component Value Date   CKTOTAL 41 (L) 09/14/2020     TSH Recent Labs    01/31/20 1608  TSH 2.591      Radiology: No results found.  Scheduled Meds: . (feeding supplement) PROSource Plus  30 mL Oral QID  . apixaban  5 mg Oral BID  . Chlorhexidine Gluconate Cloth  6 each Topical Q0600  . doxycycline  100 mg Oral Q12H  . insulin aspart  0-15 Units Subcutaneous TID WC  . insulin aspart  0-5 Units Subcutaneous QHS  . multivitamin  1 tablet Oral QHS  . Ensure Max Protein  11 oz Oral BID  . traZODone  25 mg Oral QHS   Continuous Infusions: . sodium chloride    . sodium chloride    . amiodarone 60 mg/hr (09/30/20 1153)  . amiodarone    .  famotidine (PEPCID) IV     PRN Meds:.sodium chloride, sodium chloride, acetaminophen **OR** [DISCONTINUED] acetaminophen, albuterol,  ALPRAZolam, alteplase, famotidine (PEPCID) IV, heparin, HYDROcodone-acetaminophen, lidocaine (PF), lidocaine, lidocaine-prilocaine, melatonin, metoprolol tartrate, ondansetron (ZOFRAN) IV, pentafluoroprop-tetrafluoroeth, sodium chloride flush  CARDIAC STUDIES:  EKG 09/29/2020: Afib w/RVR 130 bpm IVCD  Echocardiogram 09/18/2020: 1. Heavy smoke in the left ventricle consistent with pre-thrombotic  state, no thrombus was seen on Definity echocontrast images.  2. Left ventricular ejection fraction, by estimation, is 30 to 35%. The  left ventricle has moderately decreased function. The left ventricle  demonstrates global hypokinesis. There is severe concentric left  ventricular hypertrophy. Left ventricular  diastolic function could not be evaluated. There is the interventricular  septum is flattened in systole and diastole, consistent with right  ventricular pressure and volume overload.  3. Right ventricular systolic function is moderately reduced. The right  ventricular size is moderately enlarged. There is mildly elevated  pulmonary artery systolic pressure. The estimated right ventricular  systolic pressure is 19.1 mmHg.  4. Left atrial size was severely dilated.  5. Right atrial size was moderately dilated.  6. The mitral valve is normal in structure. Mild mitral valve  regurgitation. No evidence of mitral stenosis.  7. Tricuspid valve regurgitation is moderate.  8. The aortic valve is normal in structure. Aortic valve regurgitation is  not visualized. No aortic stenosis is present.  9. The inferior vena cava is normal in size with greater than 50%  respiratory variability, suggesting right atrial pressure of 3 mmHg.     Assessment & Recommendations:   69 y.o. Caucasian male  with hypertension, uncontrolled type 2 diabetes mellitus, morbid obesity, persistent Afib, now admitted after a fall at home, found to have Rt foot osetomyelitis, progressed to ESRD. Cardiology  consulted for management of Afib w/RVR.  Afib w/RVR: Currently on IV amiodarone. Cardioversion would be optimal for rate control. However,r he has not been on uninterrupted anticoagulation within last 4 weeks. Also, prethrombotic state in LV on echocardiogram on 09/18/2020. His risk of stroke will be high without TEE.  TEE cardioversion is not ideal in the setting of his COVID positive status. I would recommend rate control at this time. In any case, from my conversation with his daughter and HCPOA [name], he wants to go home "no matter what". I recommend the following.  While inpatient, okay to continue IV amiodarone. Added metoprolol tartarate PO 12.5 mg bid PO Metoprolol tartarate 12.5 mg bid PO Amiodarone 400 mg tid for 7 days, then reduce to 200 mg bid  High CHA2DS2VASc score. Continue eliquis 5 mg bid.   Cardiomyopathy: Multifactorial. 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  Not on ACEi/ARB/ARNI due to renal dysfunction Not on long acting metoprolol due to low blood pressures  Management of all other complex medical issues as per the primary team     Nigel Mormon, MD Pager: [phone] Office: [phone]

## 2020-09-30 NOTE — Progress Notes (Signed)
Nutrition Follow-up  DOCUMENTATION CODES:   Obesity unspecified  INTERVENTION:   D/c Ensure Max po BID  D/c Prosource Plus  Ensure Enlive po TID, each supplement provides 350 kcal and 20 grams of protein  Continue Rena-vit daily   NUTRITION DIAGNOSIS:   Increased nutrient needs related to wound healing, acute illness as evidenced by estimated needs.  ongoing  GOAL:   Patient will meet greater than or equal to 90% of their needs  Not met  MONITOR:   PO intake, Supplement acceptance, Labs, Weight trends, I & O's  REASON FOR ASSESSMENT:   Consult Diet education  ASSESSMENT:   69 y.o. male with medical history significant of type 2 DM, CHF, afib on eliquis, and HTN. He presented to the ED after being found down in the bathroom during a welfare check. Patient was noted to have unsanitary living condition and is currently followed by APS. He reported falling upon standing without LOC. He fell on his R chest which resulted in R lower chest pain. He reported to ED staff that he stopped taking all of his medications several months ago (except furosemide) because "I didn't think I needed them anymore."  11/10 s/p R IJ tunneled HD catheter placement and L radiocephalic AV fistula creation  Per MD, pt needed a COVID PCR test for SNF placement and was found to be positive with some xray evidence of PNA. MD noted that pt was ready for discharge today but unfortunately went into RVR. Cardiology consulted. Possible d/c tomorrow if rate better per MD.   Since last RD visit, pt's po intake has significantly decreased. Pt reporting appetite is poor with 0-30% meal completion. Per RN, pt has been refusing Prosource lately but has continued to drink Ensure Max. Will d/c Prosource. Given pt's drastic reduction in intake, will switch pt from Ensure Max to Ensure Enlive to provide him with a more calorically-dense supplement. One Ensure Enlive supplement provides 350 kcals, 20 grams protein,  and 44-45 grams of carbohydrate vs one Ensure Max shake supplement, which provides 150 kcals, 30 grams of protein, and 6 grams of carbohydrate. Given pt's hx of DM, RD will reassess adequacy of PO intake, CBGs, and adjust supplement regimen as appropriate at follow-up.   Last HD 11/15, net UF 1.5L  Labs: CBGs 85-102-109 Medications: ss novolog, rena-vit  Diet Order:   Diet Order            Diet renal with fluid restriction Fluid restriction: 1200 mL Fluid; Room service appropriate? Yes; Fluid consistency: Thin  Diet effective now                 EDUCATION NEEDS:   Not appropriate for education at this time  Skin:  Skin Assessment: Skin Integrity Issues: Skin Integrity Issues:: DTI, Stage III, Diabetic Ulcer, Incisions, Other (Comment) DTI: R/L buttocks Stage III: R heel Unstageable: N/A Diabetic Ulcer: Necrotic Diabetic ulcer beneath R 5th metatarsal head Incisions: R neck and L arm Other: Non-pressure wound to R foot and penis; Right anterior calf with red moist partial thickness wound  Last BM:  11/16 type 7  Height:   Ht Readings from Last 1 Encounters:  09/08/20 $RemoveB'5\' 9"'[id number]$  (1.753 m)    Weight:   Wt Readings from Last 1 Encounters:  09/29/20 119.2 kg    BMI:  Body mass index is 38.81 kg/m.  Estimated Nutritional Needs:   Kcal:  2600-2800  Protein:  160-180 grams  Fluid:  1L + UOP    Estill Bamberg  Samari Gorby, MS, RD, LDN RD pager number and weekend/on-call pager number located in [place].

## 2020-09-30 NOTE — Progress Notes (Signed)
Occupational Therapy Treatment Patient Details Name: [name]. MRN: [id number] DOB: [birth year] Today's Date: 09/30/2020    History of present illness 69 year old male with L9FX, chronic systolic and diastolic CHF, PAF supposed to be on Eliquis, HTN, morbid obesity brought to ED 09/08/2020 after he was found down in his house during a welfare check. Per report, pt in very poor living conditions and presented to hospital with B feet caked in feces and animal fur. Pt with osteomyelitis 5th toe R foot and large ulcer R heel. Pt declined amputation of R 5th toe and is currently being treated with antibiotics. Pt with ESRD and underwent placement of R tunneled IJ HD catheter 11/10. Pt was tested for Covid for SNF placement, and was found positive wtih some evidence of pneumonia. Transferred to Covid unit. Pt developed RVR on 11/16.     OT comments  Pt with development of RVR and nsg asked to limit session to bed level. HR ranged from 116 - 140s. Pt fitted with R PRAFO boot. Educated pt on purpose of boot and that the boot was to be worn at all times per Dr Jess Barters instructions. Pt unable to verbalize purpose of boot and stated "it's a waste of time and money and I'm throwing away as soon as I go home tomorrow." Explained that the [facility] boot is to be used only for transfers and that he will need to mobilize using a wheelchair in order to reduce pressure and on his heel in order to increase the chance for his wounds to heal. Pt states that he has to take care of things and he will walk as much as he needs. Pt required Max A +2 to roll in bed. Attempted to assist pt to use bedpan or transfer to [facility] (if cleared by RN) and pt stated he would "hold it until I get home".  Pt with poor insight into deficits and decreased safety awareness. Will need assistance with all mobility and ADL after DC. Recommend DC to SNF for rehab. Pt declines SNF, therefore recommend 24/7 S with HHOT, [facility] and [place].   Follow Up  Recommendations  SNF;Supervision/Assistance - 24 hour (family declines SNF - will need HHOT; [facility])    Equipment Recommendations  3 in 1 bedside commode;Hospital bed;Wheelchair (measurements OT);Wheelchair cushion (measurements OT)    Recommendations for Other Services      Precautions / Restrictions Precautions Precautions: Fall Precaution Comments: wounds on feet Required Braces or Orthoses: Other Brace Other Brace: R Prafo - per Dr Sharol Given, on at all times PRAFO has toe piece which can be modified to decrease pressure on toes from sheet/blanket; Black walking/shoe piece can be removed so that the dirty"shoe piece" does not have to go onto the bed.       Mobility Bed Mobility Overal bed mobility: Needs Assistance Bed Mobility: Rolling Rolling: Max assist;+2 for physical assistance            Transfers                 General transfer comment: unable to transfer this date due to high HR    Balance                                           ADL either performed or assessed with clinical judgement   ADL Overall ADL's : Needs assistance/impaired Eating/Feeding: Modified independent  Grooming: Set up;Sitting;Bed level   Upper Body Bathing: Set up;Sitting;Bed level   Lower Body Bathing: Maximal assistance;Bed level   Upper Body Dressing : Set up;Bed level   Lower Body Dressing: Maximal assistance;Bed level               Functional mobility during ADLs:  (unable to mobilize this date due to RVR)       Vision       Perception     Praxis      Cognition Arousal/Alertness: Awake/alert Behavior During Therapy: Agitated;Impulsive Overall Cognitive Status: No family/caregiver present to determine baseline cognitive functioning Area of Impairment: Attention;Memory;Safety/judgement;Awareness;Problem solving                   Current Attention Level: Selective Memory: Decreased recall of precautions;Decreased short-term  memory   Safety/Judgement: Decreased awareness of safety;Decreased awareness of deficits Awareness: Emergent Problem Solving: Slow processing General Comments: Pt cussing throughout session. Pt fitted with PRAFO, after explaining why pt needed to wear PROFO, pt stated it was a waste of money adn time adn he didn't know why he needed to wear it. After educating pt on need to wear the PRAFO at all times in order to help his wounds heal. pt states he was going to throw it away as soon as he got home and walk around. Pt required Max A +2 to roll in bed. when asked how he would get around once he was at home, he stated he would be fine once he was at home. Pt then states he was "an invalid" and began crying.         Exercises     Shoulder Instructions       General Comments      Pertinent Vitals/ Pain       Pain Assessment: Faces Faces Pain Scale: Hurts little more Pain Location: abdomen; R foot Pain Descriptors / Indicators: Grimacing;Guarding;Discomfort  Home Living                                          Prior Functioning/Environment              Frequency  Min 2X/week        Progress Toward Goals  OT Goals(current goals can now be found in the care plan section)  Progress towards OT goals: Progressing toward goals  Acute Rehab OT Goals Patient Stated Goal: to go home today OT Goal Formulation: With patient Time For Goal Achievement: 10/06/20 Potential to Achieve Goals: Good ADL Goals Pt Will Perform Grooming: with modified independence;standing Pt Will Perform Lower Body Bathing: with modified independence;sit to/from stand;sitting/lateral leans Pt Will Perform Lower Body Dressing: with modified independence;sitting/lateral leans;sit to/from stand Pt Will Transfer to Toilet: with modified independence;ambulating;regular height toilet Pt Will Perform Toileting - Clothing Manipulation and hygiene: with modified independence;sitting/lateral  leans;sit to/from stand Additional ADL Goal #1: Pt to verbalize at least 3 strategies to maximize skin integrity and wound healing  Plan Discharge plan needs to be updated    Co-evaluation                 AM-PAC OT "6 Clicks" Daily Activity     Outcome Measure   Help from another person eating meals?: None Help from another person taking care of personal grooming?: A Little Help from another person toileting, which includes using toliet, bedpan, or urinal?:  A Lot Help from another person bathing (including washing, rinsing, drying)?: A Lot Help from another person to put on and taking off regular upper body clothing?: A Little Help from another person to put on and taking off regular lower body clothing?: A Lot 6 Click Score: 16    End of Session    OT Visit Diagnosis: Unsteadiness on feet (R26.81);Other abnormalities of gait and mobility (R26.89);History of falling (Z91.81);Muscle weakness (generalized) (M62.81);Other symptoms and signs involving cognitive function;Pain Pain - Right/Left: Right Pain - part of body:  (foot; abdomen)   Activity Tolerance Treatment limited secondary to medical complications (Comment) (RVR)   Patient Left in bed;with bed alarm set;with call bell/phone within reach;Other (comment) (chair position)   Nurse Communication Mobility status;Other (comment) (use of PRAFO)        Time: 1400-1430 OT Time Calculation (min): 30 min  Charges: OT General Charges $OT Visit: 1 Visit OT Treatments $Self Care/Home Management : 23-37 mins  Maurie Boettcher, OT/L   Acute OT Clinical Specialist [place] Pager [phone] Office [phone]    [facility] 09/30/2020, 3:32 PM

## 2020-09-30 NOTE — Telephone Encounter (Signed)
I sent a secure chat

## 2020-09-30 NOTE — Progress Notes (Signed)
[facility] ROUNDING NOTE   Subjective:   Brief history 69 year old with history of congestive heart failure diabetes atrial fibrillation hypertension found down during a welfare check.  He has diabetic foot infection acute kidney injury.  Baseline serum creatinine 1.3 to 1.9 mg/dL.  Started dialysis 09/17/2020 has been tolerating well.  There are no signs of renal recovery.  Palliative care meeting 09/23/2020 agreed to dialysis and permanent access.  Right IJ TDC and left radiocephalic AV fistula created by Dr. Donnetta Hutching 09/24/2020 last dialysis treatment was 09/29/2020 with 1.5 L removed.  He will now dialyze on a Monday Wednesday Friday shift and his next dialysis treatment will be due 10/01/2020.  He is to be discharged.  He will start on the Covid third shift at [facility] kidney center.  Blood pressure 101/78 pulse 133 temperature 98.3 O2 sats 90% room air  Sodium 137 potassium 3.9 chloride 99 CO2 26 BUN is 36 creatinine 5.73 glucose 93 calcium 8.1 magnesium 1.9 phosphorus 6.    Objective:  Vital signs in last 24 hours:  Temp:  [98 F (36.7 C)-98.5 F (36.9 C)] 98.3 F (36.8 C) (11/16 0621) Pulse Rate:  [84-122] 121 (11/16 0650) Resp:  [16-20] 20 (11/16 0621) BP: (81-116)/(40-72) 95/60 (11/16 0650) SpO2:  [91 %-98 %] 93 % (11/16 0621) Weight:  [119.2 kg-120.7 kg] 119.2 kg (11/15 1815)  Weight change:  Filed Weights   09/28/20 0500 09/29/20 1515 09/29/20 1815  Weight: 120.7 kg 120.7 kg 119.2 kg    Intake/Output: I/O last 3 completed shifts: In: 1080 [P.O.:980; IV Piggyback:100] Out: 1500 [Other:1500]   Intake/Output this shift:  No intake/output data recorded.  General:NAD, sitting on chair, denies any symptoms Heart:RRR, s1s2 nl Lungs: Basal decreased breath sound Abdomen:soft, Non-tender, non-distended Extremities: Edema present and chronic stasis changes. Dialysis Access: Right IJ TDC, left upper extremity AV fistula site has no bleeding.  Minimal  swelling Neurology: Alert awake and oriented x3.  Basic Metabolic Panel: Recent Labs  Lab 09/26/20 0144 09/26/20 0144 09/28/20 0545 09/28/20 0545 09/28/20 0811 09/29/20 0238 09/29/20 0307 09/30/20 0105  NA 136  --  138  --   --  134* 137 137  K 3.9  --  3.5  --   --  3.4* 3.5 3.9  CL 101  --  101  --   --  101 100 99  CO2 22  --  21*  --   --  20* 16* 26  GLUCOSE 140*  --  97  --   --  106* 96 93  BUN 60*  --  53*  --   --  46* 45* 36*  CREATININE 6.79*  --  7.23*  --   --  6.61* 6.66* 5.73*  CALCIUM 8.3*   < > 8.5*   < >  --  8.2* 8.4* 8.1*  MG  --   --   --   --  1.8 2.1  --  1.9  PHOS 6.9*  --  6.4*  --   --   --  6.0*  --    < > = values in this interval not displayed.    Liver Function Tests: Recent Labs  Lab 09/26/20 0144 09/28/20 0545 09/29/20 0238 09/29/20 0307 09/30/20 0105  AST  --   --  234*  --  241*  ALT  --   --  89*  --  101*  ALKPHOS  --   --  58  --  57  BILITOT  --   --  0.6  --  0.7  PROT  --   --  7.8  --  7.4  ALBUMIN 3.1* 2.9* 2.8* 2.8* 2.6*   No results for input(s): LIPASE, AMYLASE in the last 168 hours. No results for input(s): AMMONIA in the last 168 hours.  CBC: Recent Labs  Lab 09/26/20 0144 09/28/20 0545 09/29/20 0238 09/29/20 2230 09/30/20 0105  WBC 8.6 6.1 5.3 5.1 4.8  NEUTROABS  --   --  3.9  --  3.9  HGB 10.6* 10.9* 11.3* 11.5* 10.6*  HCT 34.0* 35.3* 36.1* 36.4* 34.6*  MCV 85.6 85.3 85.1 85.0 85.9  PLT 193 195 212 205 202    Cardiac Enzymes: No results for input(s): CKTOTAL, CKMB, CKMBINDEX, TROPONINI in the last 168 hours.  BNP: Invalid input(s): POCBNP  CBG: Recent Labs  Lab 09/28/20 2216 09/29/20 0732 09/29/20 1148 09/29/20 2135 09/30/20 0719  GLUCAP 120* 101* 112* 87 85    Microbiology: Results for orders placed or performed during the hospital encounter of 09/08/20  Respiratory Panel by RT PCR (Flu A&B, Covid) - Nasopharyngeal Swab     Status: None   Collection Time: 09/08/20  5:13 PM   Specimen:  Nasopharyngeal Swab  Result Value Ref Range Status   SARS Coronavirus 2 by RT PCR NEGATIVE NEGATIVE Final    Comment: (NOTE) SARS-CoV-2 target nucleic acids are NOT DETECTED.  The SARS-CoV-2 RNA is generally detectable in upper respiratoy specimens during the acute phase of infection. The lowest concentration of SARS-CoV-2 viral copies this assay can detect is 131 copies/mL. A negative result does not preclude SARS-Cov-2 infection and should not be used as the sole basis for treatment or other patient management decisions. A negative result may occur with  improper specimen collection/handling, submission of specimen other than nasopharyngeal swab, presence of viral mutation(s) within the areas targeted by this assay, and inadequate number of viral copies (<131 copies/mL). A negative result must be combined with clinical observations, patient history, and epidemiological information. The expected result is Negative.  Fact Sheet for Patients:  PinkCheek.be  Fact Sheet for Healthcare Providers:  GravelBags.it  This test is no t yet approved or cleared by the [place] FDA and  has been authorized for detection and/or diagnosis of SARS-CoV-2 by FDA under an Emergency Use Authorization (EUA). This EUA will remain  in effect (meaning this test can be used) for the duration of the COVID-19 declaration under Section 564(b)(1) of the Act, 21 U.S.C. section 360bbb-3(b)(1), unless the authorization is terminated or revoked sooner.     Influenza A by PCR NEGATIVE NEGATIVE Final   Influenza B by PCR NEGATIVE NEGATIVE Final    Comment: (NOTE) The Xpert Xpress SARS-CoV-2/FLU/RSV assay is intended as an aid in  the diagnosis of influenza from Nasopharyngeal swab specimens and  should not be used as a sole basis for treatment. Nasal washings and  aspirates are unacceptable for Xpert Xpress SARS-CoV-2/FLU/RSV  testing.  Fact Sheet  for Patients: PinkCheek.be  Fact Sheet for Healthcare Providers: GravelBags.it  This test is not yet approved or cleared by the [place] FDA and  has been authorized for detection and/or diagnosis of SARS-CoV-2 by  FDA under an Emergency Use Authorization (EUA). This EUA will remain  in effect (meaning this test can be used) for the duration of the  Covid-19 declaration under Section 564(b)(1) of the Act, 21  U.S.C. section 360bbb-3(b)(1), unless the authorization is  terminated or revoked. Performed at [facility], [address]  [place], [place] [address]   Blood Cultures x 2 sites     Status: None   Collection Time: 09/08/20  7:04 PM   Specimen: BLOOD  Result Value Ref Range Status   Specimen Description   Final    BLOOD LEFT WRIST Performed at [facility] [address] [place], [place] [address]    Special Requests   Final    BOTTLES DRAWN AEROBIC AND ANAEROBIC Blood Culture adequate volume Performed at [facility] [address], [place], [place] [address]    Culture   Final    NO GROWTH 5 DAYS Performed at [facility], [place] [address], [name], [place] [address]    Report Status 09/13/2020 FINAL  Final  Culture, blood (x 2)     Status: None   Collection Time: 09/08/20  9:03 PM   Specimen: BLOOD LEFT HAND  Result Value Ref Range Status   Specimen Description   Final    BLOOD LEFT HAND Performed at [facility] [address] [place], [place] [address]    Special Requests   Final    BOTTLES DRAWN AEROBIC AND ANAEROBIC Blood Culture adequate volume Performed at [facility] [address] [place], [place] [address]    Culture   Final    NO GROWTH 5 DAYS Performed at [facility], [place] [address] [place], [place] [address]    Report Status 09/13/2020 FINAL  Final  SARS Coronavirus 2 by  RT PCR (hospital order, performed in [facility] hospital lab) Nasopharyngeal Nasopharyngeal Swab     Status: Abnormal   Collection Time: 09/27/20 10:03 AM   Specimen: Nasopharyngeal Swab  Result Value Ref Range Status   SARS Coronavirus 2 POSITIVE (A) NEGATIVE Final    Comment: RESULT CALLED TO, READ BACK BY AND VERIFIED WITH: RN C APPLLEWHITE [id number] AT 9798 BY CM (NOTE) SARS-CoV-2 target nucleic acids are DETECTED  SARS-CoV-2 RNA is generally detectable in upper respiratory specimens  during the acute phase of infection.  Positive results are indicative  of the presence of the identified virus, but do not rule out bacterial infection or co-infection with other pathogens not detected by the test.  Clinical correlation with patient history and  other diagnostic information is necessary to determine patient infection status.  The expected result is negative.  Fact Sheet for Patients:   StrictlyIdeas.no   Fact Sheet for Healthcare Providers:   BankingDealers.co.za    This test is not yet approved or cleared by the [place] FDA and  has been authorized for detection and/or diagnosis of SARS-CoV-2 by FDA under an Emergency Use Authorization (EUA).  This EUA will remain in effect (meaning this  test can be used) for the duration of  the COVID-19 declaration under Section 564(b)(1) of the Act, 21 U.S.C. section 360-bbb-3(b)(1), unless the authorization is terminated or revoked sooner.  Performed at [facility], [place] [address] [place], [place] [address]   Resp Panel by RT PCR (RSV, Flu A&B, Covid) - Nasopharyngeal Swab     Status: Abnormal   Collection Time: 09/27/20 12:29 PM   Specimen: Nasopharyngeal Swab  Result Value Ref Range Status   SARS Coronavirus 2 by RT PCR POSITIVE (A) NEGATIVE Final    Comment: RESULT CALLED TO, READ BACK BY AND VERIFIED WITH: RN R AUGUSTUS [id number] AT 1404 BY CM (NOTE) SARS-CoV-2 target nucleic  acids are DETECTED.  SARS-CoV-2 RNA is generally detectable in upper  respiratory specimens  during the acute phase of infection. Positive results are indicative of the presence of the identified virus, but do not rule out bacterial infection or co-infection with other pathogens not detected by the test. Clinical correlation with patient history and other diagnostic information is necessary to determine patient infection status. The expected result is Negative.  Fact Sheet for Patients:  PinkCheek.be  Fact Sheet for Healthcare Providers: GravelBags.it  This test is not yet approved or cleared by the [place] FDA and  has been authorized for detection and/or diagnosis of SARS-CoV-2 by FDA under an Emergency Use Authorization (EUA).  This EUA will remain in effect (meaning this test can be  used) for the duration of  the COVID-19 declaration under Section 564(b)(1) of the Act, 21 U.S.C. section 360bbb-3(b)(1), unless the authorization is terminated or revoked sooner.      Influenza A by PCR NEGATIVE NEGATIVE Final   Influenza B by PCR NEGATIVE NEGATIVE Final    Comment: (NOTE) The Xpert Xpress SARS-CoV-2/FLU/RSV assay is intended as an aid in  the diagnosis of influenza from Nasopharyngeal swab specimens and  should not be used as a sole basis for treatment. Nasal washings and  aspirates are unacceptable for Xpert Xpress SARS-CoV-2/FLU/RSV  testing.  Fact Sheet for Patients: PinkCheek.be  Fact Sheet for Healthcare Providers: GravelBags.it  This test is not yet approved or cleared by the [place] FDA and  has been authorized for detection and/or diagnosis of SARS-CoV-2 by  FDA under an Emergency Use Authorization (EUA). This EUA will remain  in effect (meaning this test can be used) for the duration of the  Covid-19 declaration under Section 564(b)(1) of  the Act, 21  U.S.C. section 360bbb-3(b)(1), unless the authorization is  terminated or revoked.    Respiratory Syncytial Virus by PCR NEGATIVE NEGATIVE Final    Comment: (NOTE) Fact Sheet for Patients: PinkCheek.be  Fact Sheet for Healthcare Providers: GravelBags.it  This test is not yet approved or cleared by the [place] FDA and  has been authorized for detection and/or diagnosis of SARS-CoV-2 by  FDA under an Emergency Use Authorization (EUA). This EUA will remain  in effect (meaning this test can be used) for the duration of the  COVID-19 declaration under Section 564(b)(1) of the Act, 21 U.S.C.  section 360bbb-3(b)(1), unless the authorization is terminated or  revoked. Performed at [facility], [place] [address], [place], [place] [address]   MRSA PCR Screening     Status: None   Collection Time: 09/28/20  8:30 AM   Specimen: Nasal Mucosa; Nasopharyngeal  Result Value Ref Range Status   MRSA by PCR NEGATIVE NEGATIVE Final    Comment:        The GeneXpert MRSA Assay (FDA approved for NASAL specimens only), is one component of a comprehensive MRSA colonization surveillance program. It is not intended to diagnose MRSA infection nor to guide or monitor treatment for MRSA infections. Performed at [facility], [place] [address] [place], [place] [address]     Coagulation Studies: No results for input(s): LABPROT, INR in the last 72 hours.  Urinalysis: No results for input(s): COLORURINE, LABSPEC, PHURINE, GLUCOSEU, HGBUR, BILIRUBINUR, KETONESUR, PROTEINUR, UROBILINOGEN, NITRITE, LEUKOCYTESUR in the last 72 hours.  Invalid input(s): APPERANCEUR    Imaging: No results found.   Medications:   . sodium chloride    . sodium chloride    . sodium chloride    . sodium chloride    . sodium  chloride    . famotidine (PEPCID) IV     . (feeding supplement) PROSource Plus  30 mL Oral QID  .  amiodarone  150 mg Intravenous Once  . amoxicillin-clavulanate  1 tablet Oral Daily  . apixaban  5 mg Oral BID  . Chlorhexidine Gluconate Cloth  6 each Topical Q0600  . Chlorhexidine Gluconate Cloth  6 each Topical Q0600  . doxycycline  100 mg Oral Q12H  . insulin aspart  0-15 Units Subcutaneous TID WC  . insulin aspart  0-5 Units Subcutaneous QHS  . multivitamin  1 tablet Oral QHS  . Ensure Max Protein  11 oz Oral BID  . traZODone  25 mg Oral QHS   sodium chloride, sodium chloride, sodium chloride, sodium chloride, sodium chloride, acetaminophen **OR** acetaminophen, albuterol, ALPRAZolam, alteplase, diphenhydrAMINE, EPINEPHrine, famotidine (PEPCID) IV, heparin, HYDROcodone-acetaminophen, HYDROmorphone (DILAUDID) injection, lidocaine (PF), lidocaine, lidocaine-prilocaine, melatonin, methylPREDNISolone (SOLU-MEDROL) injection, metoprolol tartrate, ondansetron (ZOFRAN) IV, pentafluoroprop-tetrafluoroeth, sodium chloride flush  Assessment/ Plan:  1.Acute kidney injury on CKD stage IIIb: Baseline creatinine level 1.3-1.9.  AKI multifactorial etiology including cardiorenal syndrome, ATN.  Started dialysis on 11/3 and has been tolerating well.  Tried high-dose IV diuretics without response.   No sign of renal recovery.  We had a meeting in the presence of palliative care on 11/9 with the patient and his daughter. They agreed to dialysis and permanent access. RIJ TDC and left radiocephalic AV fistula created by Dr. Donnetta Hutching on 11/10. Marland KitchenHe has OP HD unit arranged at [facility] clinic Monday Wednesday Friday schedule COVID shift in the evening Status post HD on 09/30/2020 with 2 L UF, next dialysis will be 10/01/2020  2.Covid positive, fever: The Covid was positive while checked for placement.  Is now febrile and hypotensive.  Plan for Covid antibody infusion per primary team.  3.CHF with systolic and diastolic dysfunction: Volume overload causing cardiorenal syndrome.  Did not respond with IV  diuretics therefore now volume management with dialysis. Discontinued diuretics.  4.Hypertension: On metoprolol for A. fib.   Some labile blood pressures noted does continue on midodrine may be able to decrease this dose to as needed with dialysis  5. Sepsis with osteomyelitis of fifth toe on the right foot: On antibiotics per primary team.  6.Anemia due to sepsis, AKI: Hemoglobin at goal, iron saturation is low however I will hold IV iron because of infection and currently on antibiotics.  7.Metabolic acidosis: Managed with dialysis now.  8.Bleeding from tunnel catheter site: The temporary catheter is out.  No bleeding.  9.A. fib with RVR: No further bleeding, on BB and Eliquis.    LOS: [place] @TODAY @7 :57 AM

## 2020-09-30 NOTE — Progress Notes (Signed)
   09/30/20 1561  Assess: MEWS Score  Temp 98.3 F (36.8 C)  BP (!) 82/63  Pulse Rate 96  ECG Heart Rate (!) 133  Resp 20  SpO2 93 %  O2 Device Room Air  Patient Activity (if Appropriate) In bed  Assess: MEWS Score  MEWS Temp 0  MEWS Systolic 1  MEWS Pulse 3  MEWS RR 0  MEWS LOC 0  MEWS Score 4  MEWS Score Color Red  Assess: if the MEWS score is Yellow or Red  Were vital signs taken at a resting state? Yes  Focused Assessment No change from prior assessment  Early Detection of Sepsis Score *See Row Information* Medium  MEWS guidelines implemented *See Row Information* Yes  Treat  MEWS Interventions Escalated (See documentation below)  Pain Scale 0-10  Pain Score 0  Take Vital Signs  Increase Vital Sign Frequency  Red: Q 1hr X 4 then Q 4hr X 4, if remains red, continue Q 4hrs  Escalate  MEWS: Escalate Red: discuss with charge nurse/RN and provider, consider discussing with RRT  Notify: Charge Nurse/RN  Name of Charge Nurse/RN Notified Vicente Males RN  Date Charge Nurse/RN Notified 09/30/20  Time Charge Nurse/RN Notified [phone]  Notify: Provider  Provider Name/Title Dr. Marlowe Sax  Date Provider Notified 09/30/20  Time Provider Notified [phone]  Notification Type Page  Notification Reason Other (Comment) (Red Mews)  Document  Progress note created (see row info) Yes

## 2020-09-30 NOTE — Telephone Encounter (Signed)
Jeani Hawking with [facility] PT would like to get clarification on wether Dr.Duda wanted the pt to wear the [facility] around the clock or if it's only to be worn during transfers? Jeani Hawking states the pts daughter is trying to take the pt home so she would like a CB as soon as possible.   Jeani Hawking CB# [phone]

## 2020-09-30 NOTE — Telephone Encounter (Signed)
I do not see an office note or hospital consult note on this pt. Did you talk with another provider or give orders for [facility] on this pt?

## 2020-09-30 NOTE — Progress Notes (Signed)
Orthopedic Tech Progress Note Patient Details:  [name]. [birth year] [id number] Spoke with THERAPY about getting patient a BIGGER PRAFO BOOT. So I called [employer] to order patient an EXTRA LARGE if not 2XL BOOT. [facility] is coming to look at foot a so they would order to correct thing/size.. Patient ID: [name]., male   DOB: 10-30-1951, 69 y.o.   MRN: [id number]   [name] 09/30/2020, 8:52 AM

## 2020-09-30 NOTE — Progress Notes (Signed)
PT Cancellation Note  Patient Details Name: [name]. MRN: [id number] DOB: March 01, 1951   Cancelled Treatment:    Reason Eval/Treat Not Completed: Medical issues which prohibited therapy  Checked on pt several times today and continued with elevated HR 120-148 at rest. Made call to Dr. Jess Barters office to clarify desire for [facility] as the one that was delivered per 10/26 MD order was documented to be too small and is no longer in patient's room. Noted PRAFO was delivered (!) and OT addressed with pt this pm.   Agree with SNF recommendation and noted that pt/daughter are refusing SNF.   Will need Wheelchair plus cushion (noted already ordered and delivered per discussion 11/15). Patient could benefit from hospital bed however is refusing and stating he already has one at home that his wife uses. He does not seem to understand that he and wife cannot use same hospital bed and continues to state even if he got one, he would not use it.    Arby Barrette, PT Pager [phone]   Rexanne Mano 09/30/2020, 4:27 PM

## 2020-10-01 ENCOUNTER — Encounter (HOSPITAL_COMMUNITY): Payer: Medicare Other

## 2020-10-01 ENCOUNTER — Encounter (HOSPITAL_COMMUNITY): Payer: Self-pay | Admitting: Certified Registered Nurse Anesthetist

## 2020-10-01 ENCOUNTER — Encounter (HOSPITAL_COMMUNITY)
Admission: EM | Discharge status: Against Medical Advice | Payer: Self-pay | Source: Home / Self Care | Attending: Internal Medicine

## 2020-10-01 LAB — COMPREHENSIVE METABOLIC PANEL
ALT: 82 U/L — ABNORMAL HIGH (ref 0–44)
AST: 174 U/L — ABNORMAL HIGH (ref 15–41)
Albumin: 2.5 g/dL — ABNORMAL LOW (ref 3.5–5.0)
Alkaline Phosphatase: 66 U/L (ref 38–126)
Anion gap: 17 — ABNORMAL HIGH (ref 5–15)
BUN: 70 mg/dL — ABNORMAL HIGH (ref 8–23)
CO2: 20 mmol/L — ABNORMAL LOW (ref 22–32)
Calcium: 7.9 mg/dL — ABNORMAL LOW (ref 8.9–10.3)
Chloride: 96 mmol/L — ABNORMAL LOW (ref 98–111)
Creatinine, Ser: 8.06 mg/dL — ABNORMAL HIGH (ref 0.61–1.24)
GFR, Estimated: 7 mL/min — ABNORMAL LOW (ref >60)
Glucose, Bld: 103 mg/dL — ABNORMAL HIGH (ref 70–99)
Potassium: 3.9 mmol/L (ref 3.5–5.1)
Sodium: 133 mmol/L — ABNORMAL LOW (ref 135–145)
Total Bilirubin: 0.7 mg/dL (ref 0.3–1.2)
Total Protein: 7.1 g/dL (ref 6.5–8.1)

## 2020-10-01 LAB — CBC WITH DIFFERENTIAL/PLATELET
Abs Immature Granulocytes: 0.04 10*3/uL (ref 0.00–0.07)
Basophils Absolute: 0 10*3/uL (ref 0.0–0.1)
Basophils Relative: 0 %
Eosinophils Absolute: 0.1 10*3/uL (ref 0.0–0.5)
Eosinophils Relative: 1 %
HCT: 32.7 % — ABNORMAL LOW (ref 39.0–52.0)
Hemoglobin: 10.2 g/dL — ABNORMAL LOW (ref 13.0–17.0)
Immature Granulocytes: 1 %
Lymphocytes Relative: 29 %
Lymphs Abs: 1.3 10*3/uL (ref 0.7–4.0)
MCH: 26.4 pg (ref 26.0–34.0)
MCHC: 31.2 g/dL (ref 30.0–36.0)
MCV: 84.7 fL (ref 80.0–100.0)
Monocytes Absolute: 0.5 10*3/uL (ref 0.1–1.0)
Monocytes Relative: 12 %
Neutro Abs: 2.5 10*3/uL (ref 1.7–7.7)
Neutrophils Relative %: 57 %
Platelets: 217 10*3/uL (ref 150–400)
RBC: 3.86 MIL/uL — ABNORMAL LOW (ref 4.22–5.81)
RDW: 15.8 % — ABNORMAL HIGH (ref 11.5–15.5)
WBC: 4.5 10*3/uL (ref 4.0–10.5)
nRBC: 0 % (ref 0.0–0.2)

## 2020-10-01 LAB — PHOSPHORUS: Phosphorus: 7.3 mg/dL — ABNORMAL HIGH (ref 2.5–4.6)

## 2020-10-01 LAB — D-DIMER, QUANTITATIVE: D-Dimer, Quant: 2.32 ug/mL-FEU — ABNORMAL HIGH (ref 0.00–0.50)

## 2020-10-01 LAB — PROCALCITONIN: Procalcitonin: 2.39 ng/mL

## 2020-10-01 LAB — BRAIN NATRIURETIC PEPTIDE: B Natriuretic Peptide: 854.5 pg/mL — ABNORMAL HIGH (ref 0.0–100.0)

## 2020-10-01 LAB — GLUCOSE, CAPILLARY: Glucose-Capillary: 103 mg/dL — ABNORMAL HIGH (ref 70–99)

## 2020-10-01 LAB — C-REACTIVE PROTEIN: CRP: 8.3 mg/dL — ABNORMAL HIGH (ref <1.0)

## 2020-10-01 LAB — MAGNESIUM: Magnesium: 2 mg/dL (ref 1.7–2.4)

## 2020-10-01 SURGERY — CANCELLED PROCEDURE

## 2020-10-01 MED ORDER — HEPARIN SODIUM (PORCINE) 1000 UNIT/ML IJ SOLN
INTRAMUSCULAR | Status: AC
  Administered 2020-10-01: 3800 [IU] via INTRAVENOUS_CENTRAL
  Filled 2020-10-01: qty 4

## 2020-10-01 MED ORDER — RENA-VITE PO TABS: 1.0000 | ORAL_TABLET | Freq: Every day | ORAL | 0 refills | Status: AC

## 2020-10-01 MED ORDER — ALTEPLASE 2 MG IJ SOLR: 2.0000 mg | Freq: Once | INTRAMUSCULAR | Status: DC | PRN

## 2020-10-01 MED ORDER — MELATONIN 3 MG PO TABS: 6.0000 mg | ORAL_TABLET | Freq: Every evening | ORAL | 0 refills | Status: AC | PRN

## 2020-10-01 MED ORDER — PENTAFLUOROPROP-TETRAFLUOROETH EX AERO: 1.0000 "application " | INHALATION_SPRAY | CUTANEOUS | Status: DC | PRN

## 2020-10-01 MED ORDER — SODIUM CHLORIDE 0.9 % IV SOLN: 100.0000 mL | INTRAVENOUS | Status: DC | PRN

## 2020-10-01 MED ORDER — MIDODRINE HCL 2.5 MG PO TABS: 2.5000 mg | ORAL_TABLET | Freq: Two times a day (BID) | ORAL | 0 refills | Status: AC

## 2020-10-01 MED ORDER — HEPARIN SODIUM (PORCINE) 1000 UNIT/ML DIALYSIS: 1000.0000 [IU] | INTRAMUSCULAR | Status: DC | PRN

## 2020-10-01 MED ORDER — APIXABAN 5 MG PO TABS: 5.0000 mg | ORAL_TABLET | Freq: Two times a day (BID) | ORAL | 0 refills | Status: AC

## 2020-10-01 MED ORDER — PANTOPRAZOLE SODIUM 40 MG PO TBEC: 40.0000 mg | DELAYED_RELEASE_TABLET | Freq: Every day | ORAL | 0 refills | Status: AC

## 2020-10-01 MED ORDER — AMIODARONE HCL 200 MG PO TABS: ORAL_TABLET | ORAL | 0 refills | Status: AC

## 2020-10-01 MED ORDER — LIDOCAINE-PRILOCAINE 2.5-2.5 % EX CREA: 1.0000 "application " | TOPICAL_CREAM | CUTANEOUS | Status: DC | PRN

## 2020-10-01 MED ORDER — ACETAMINOPHEN 325 MG PO TABS: 650.0000 mg | ORAL_TABLET | Freq: Four times a day (QID) | ORAL | Status: AC | PRN

## 2020-10-01 MED ORDER — METOPROLOL TARTRATE 25 MG PO TABS: 12.5000 mg | ORAL_TABLET | Freq: Two times a day (BID) | ORAL | 0 refills | Status: AC

## 2020-10-01 MED ORDER — LIDOCAINE HCL (PF) 1 % IJ SOLN: 5.0000 mL | INTRAMUSCULAR | Status: DC | PRN

## 2020-10-01 MED ORDER — DOXYCYCLINE HYCLATE 100 MG PO TABS: 100.0000 mg | ORAL_TABLET | Freq: Two times a day (BID) | ORAL | 0 refills | Status: AC

## 2020-10-01 MED ORDER — ALBUMIN HUMAN 25 % IV SOLN
INTRAVENOUS | Status: AC
  Filled 2020-10-01: qty 100

## 2020-10-01 NOTE — Progress Notes (Signed)
PT Cancellation Note  Patient Details Name: [name]. MRN: [id number] DOB: July 10, 1951   Cancelled Treatment:    Reason Eval/Treat Not Completed: Patient at procedure or test/unavailable  Attempted to see pt as anticipate patient will discharge to daughter's care today (leaving AMA per RN). Patient is currently in hemodialysis.  Mabeline Caras, PT will be covering this unit in the afternoon and will be available should needs arise.   Arby Barrette, PT Pager [phone]   Jeanie Cooks Tashika Goodin 10/01/2020, 9:00 AM

## 2020-10-01 NOTE — Progress Notes (Signed)
Spoke with a nurse regarding the unna boot status of the patient. Per the nurse, the patient still has unna boots on. Vascular will be unable to perform a complete lower extremity venous duplex with boots present. Please call the vascular department when the boots have been removed.  10/01/20 11:08 AM Carlos Levering RVT

## 2020-10-01 NOTE — Discharge Instructions (Signed)
[facility]  Discharge Instructions  AV Fistula or Graft Surgery for Dialysis Access  Please refer to the following instructions for your post-procedure care. Your surgeon or physician assistant will discuss any changes with you.  Activity  You may drive the day following your surgery, if you are comfortable and no longer taking prescription pain medication. Resume full activity as the soreness in your incision resolves.  Bathing/Showering  You may shower after you go home. Keep your incision dry for 48 hours. Do not soak in a bathtub, hot tub, or swim until the incision heals completely. You may not shower if you have a hemodialysis catheter.  Incision Care  Clean your incision with mild soap and water after 48 hours. Pat the area dry with a clean towel. You do not need a bandage unless otherwise instructed. Do not apply any ointments or creams to your incision. You may have skin glue on your incision. Do not peel it off. It will come off on its own in about one week. Your arm may swell a bit after surgery. To reduce swelling use pillows to elevate your arm so it is above your heart. Your doctor will tell you if you need to lightly wrap your arm with an ACE bandage.  Diet  Resume your normal diet. There are not special food restrictions following this procedure. In order to heal from your surgery, it is CRITICAL to get adequate nutrition. Your body requires vitamins, minerals, and protein. Vegetables are the best source of vitamins and minerals. Vegetables also provide the perfect balance of protein. Processed food has little nutritional value, so try to avoid this.  Medications  Resume taking all of your medications. If your incision is causing pain, you may take over-the counter pain relievers such as acetaminophen (Tylenol). If you were prescribed a stronger pain medication, please be aware these medications can cause nausea and constipation. Prevent  nausea by taking the medication with a snack or meal. Avoid constipation by drinking plenty of fluids and eating foods with high amount of fiber, such as fruits, vegetables, and grains. Do not take Tylenol if you are taking prescription pain medications.     Follow up Your surgeon may want to see you in the office following your access surgery. If so, this will be arranged at the time of your surgery.  Please call us immediately for any of the following conditions:  Increased pain, redness, drainage (pus) from your incision site Fever of 101 degrees or higher Severe or worsening pain at your incision site Hand pain or numbness.  Reduce your risk of vascular disease:  Stop smoking. If you would like help, call [facility] at 1-800-QUIT-NOW 917-699-3342) or [place] at [place] your cholesterol Maintain a desired weight Control your diabetes Keep your blood pressure down  Dialysis  It will take several weeks to several months for your new dialysis access to be ready for use. Your surgeon will determine when it is OK to use it. Your nephrologist will continue to direct your dialysis. You can continue to use your Permcath until your new access is ready for use.  If you have any questions, please call the office at [phone].      Information on my medicine - ELIQUIS (apixaban)  This medication education was reviewed with me or my healthcare representative as part of my discharge preparation.  The pharmacist that spoke with me during my hospital stay was:  Penni Homans, Student-PharmD  Why was Eliquis prescribed for you? Eliquis was prescribed for you to reduce the risk of a blood clot forming that can cause a stroke if you have a medical condition called atrial fibrillation (a type of irregular heartbeat).  What do You need to know about Eliquis ? Take your Eliquis TWICE DAILY - one tablet in the morning and one tablet in the evening with or without  food. If you have difficulty swallowing the tablet whole please discuss with your pharmacist how to take the medication safely.  Take Eliquis exactly as prescribed by your doctor and DO NOT stop taking Eliquis without talking to the doctor who prescribed the medication.  Stopping may increase your risk of developing a stroke.  Refill your prescription before you run out.  After discharge, you should have regular check-up appointments with your healthcare provider that is prescribing your Eliquis.  In the future your dose may need to be changed if your kidney function or weight changes by a significant amount or as you get older.  What do you do if you miss a dose? If you miss a dose, take it as soon as you remember on the same day and resume taking twice daily.  Do not take more than one dose of ELIQUIS at the same time to make up a missed dose.  Important Safety Information A possible side effect of Eliquis is bleeding. You should call your healthcare provider right away if you experience any of the following: ? Bleeding from an injury or your nose that does not stop. ? Unusual colored urine (red or dark brown) or unusual colored stools (red or black). ? Unusual bruising for unknown reasons. ? A serious fall or if you hit your head (even if there is no bleeding).  Some medicines may interact with Eliquis and might increase your risk of bleeding or clotting while on Eliquis. To help avoid this, consult your healthcare provider or pharmacist prior to using any new prescription or non-prescription medications, including herbals, vitamins, non-steroidal anti-inflammatory drugs (NSAIDs) and supplements.  This website has more information on Eliquis (apixaban): http://www.eliquis.com/eliquis/home     Person Under Monitoring Name: Malek Skog.  Location: [place] [address]   Infection Prevention Recommendations for Individuals Confirmed to have, or Being  Evaluated for, 2019 Novel Coronavirus (COVID-19) Infection Who Receive Care at Home  Individuals who are confirmed to have, or are being evaluated for, COVID-19 should follow the prevention steps below until a healthcare provider or local or state health department says they can return to normal activities.  Stay home except to get medical care You should restrict activities outside your home, except for getting medical care. Do not go to work, school, or public areas, and do not use public transportation or taxis.  Call ahead before visiting your doctor Before your medical appointment, call the healthcare provider and tell them that you have, or are being evaluated for, COVID-19 infection. This will help the healthcare provider's office take steps to keep other people from getting infected. Ask your healthcare provider to call the local or state health department.  Monitor your symptoms Seek prompt medical attention if your illness is worsening (e.g., difficulty breathing). Before going to your medical appointment, call the healthcare provider and tell them that you have, or are being evaluated for, COVID-19 infection. Ask your healthcare provider to call the local or state health department.  Wear a facemask You should wear a facemask that covers your nose  and mouth when you are in the same room with other people and when you visit a healthcare provider. People who live with or visit you should also wear a facemask while they are in the same room with you.  Separate yourself from other people in your home As much as possible, you should stay in a different room from other people in your home. Also, you should use a separate bathroom, if available.  Avoid sharing household items You should not share dishes, drinking glasses, cups, eating utensils, towels, bedding, or other items with other people in your home. After using these items, you should wash them thoroughly with soap and  water.  Cover your coughs and sneezes Cover your mouth and nose with a tissue when you cough or sneeze, or you can cough or sneeze into your sleeve. Throw used tissues in a lined trash can, and immediately wash your hands with soap and water for at least 20 seconds or use an alcohol-based hand rub.  Wash your [facility] your hands often and thoroughly with soap and water for at least 20 seconds. You can use an alcohol-based hand sanitizer if soap and water are not available and if your hands are not visibly dirty. Avoid touching your eyes, nose, and mouth with unwashed hands.   Prevention Steps for Caregivers and Household Members of Individuals Confirmed to have, or Being Evaluated for, COVID-19 Infection Being Cared for in the Home  If you live with, or provide care at home for, a person confirmed to have, or being evaluated for, COVID-19 infection please follow these guidelines to prevent infection:  Follow healthcare provider's instructions Make sure that you understand and can help the patient follow any healthcare provider instructions for all care.  Provide for the patient's basic needs You should help the patient with basic needs in the home and provide support for getting groceries, prescriptions, and other personal needs.  Monitor the patient's symptoms If they are getting sicker, call his or her medical provider and tell them that the patient has, or is being evaluated for, COVID-19 infection. This will help the healthcare provider's office take steps to keep other people from getting infected. Ask the healthcare provider to call the local or state health department.  Limit the number of people who have contact with the patient  If possible, have only one caregiver for the patient.  Other household members should stay in another home or place of residence. If this is not possible, they should stay  in another room, or be separated from the patient as much as possible.  Use a separate bathroom, if available.  Restrict visitors who do not have an essential need to be in the home.  Keep older adults, very young children, and other sick people away from the patient Keep older adults, very young children, and those who have compromised immune systems or chronic health conditions away from the patient. This includes people with chronic heart, lung, or kidney conditions, diabetes, and cancer.  Ensure good ventilation Make sure that shared spaces in the home have good air flow, such as from an air conditioner or an opened window, weather permitting.  Wash your hands often  Wash your hands often and thoroughly with soap and water for at least 20 seconds. You can use an alcohol based hand sanitizer if soap and water are not available and if your hands are not visibly dirty.  Avoid touching your eyes, nose, and mouth with unwashed hands.  Use  disposable paper towels to dry your hands. If not available, use dedicated cloth towels and replace them when they become wet.  Wear a facemask and gloves  Wear a disposable facemask at all times in the room and gloves when you touch or have contact with the patient's blood, body fluids, and/or secretions or excretions, such as sweat, saliva, sputum, nasal mucus, vomit, urine, or feces.  Ensure the mask fits over your nose and mouth tightly, and do not touch it during use.  Throw out disposable facemasks and gloves after using them. Do not reuse.  Wash your hands immediately after removing your facemask and gloves.  If your personal clothing becomes contaminated, carefully remove clothing and launder. Wash your hands after handling contaminated clothing.  Place all used disposable facemasks, gloves, and other waste in a lined container before disposing them with other household waste.  Remove gloves and wash your hands immediately after handling these items.  Do not share dishes, glasses, or other household items with  the patient  Avoid sharing household items. You should not share dishes, drinking glasses, cups, eating utensils, towels, bedding, or other items with a patient who is confirmed to have, or being evaluated for, COVID-19 infection.  After the person uses these items, you should wash them thoroughly with soap and water.  Wash laundry thoroughly  Immediately remove and wash clothes or bedding that have blood, body fluids, and/or secretions or excretions, such as sweat, saliva, sputum, nasal mucus, vomit, urine, or feces, on them.  Wear gloves when handling laundry from the patient.  Read and follow directions on labels of laundry or clothing items and detergent. In general, wash and dry with the warmest temperatures recommended on the label.  Clean all areas the individual has used often  Clean all touchable surfaces, such as counters, tabletops, doorknobs, bathroom fixtures, toilets, phones, keyboards, tablets, and bedside tables, every day. Also, clean any surfaces that may have blood, body fluids, and/or secretions or excretions on them.  Wear gloves when cleaning surfaces the patient has come in contact with.  Use a diluted bleach solution (e.g., dilute bleach with 1 part bleach and 10 parts water) or a household disinfectant with a label that says EPA-registered for coronaviruses. To make a bleach solution at home, add 1 tablespoon of bleach to 1 quart (4 cups) of water. For a larger supply, add  cup of bleach to 1 gallon (16 cups) of water.  Read labels of cleaning products and follow recommendations provided on product labels. Labels contain instructions for safe and effective use of the cleaning product including precautions you should take when applying the product, such as wearing gloves or eye protection and making sure you have good ventilation during use of the product.  Remove gloves and wash hands immediately after cleaning.  Monitor yourself for signs and symptoms of  illness Caregivers and household members are considered close contacts, should monitor their health, and will be asked to limit movement outside of the home to the extent possible. Follow the monitoring steps for close contacts listed on the symptom monitoring form.   ? If you have additional questions, contact your local health department or call the epidemiologist on call at [phone] (available 24/7). ? This guidance is subject to change. For the most up-to-date guidance from [facility], please refer to their website: [email or web address]

## 2020-10-01 NOTE — Progress Notes (Signed)
   10/01/20 1247  Vitals  Temp 98 F (36.7 C)  Temp Source Oral  BP 106/79  MAP (mmHg) 87  BP Location Right Arm  BP Method Automatic  Patient Position (if appropriate) Lying  ECG Heart Rate 96  Resp 18  Oxygen Therapy  SpO2 99 %  O2 Device Room Air  Dialysis Weight  Weight 112.8 kg  Type of Weight Post-Dialysis  Post-Hemodialysis Assessment  Rinseback Volume (mL) 250 mL  KECN 245 V  Dialyzer Clearance Lightly streaked  Duration of HD Treatment -hour(s) 4 hour(s)  Hemodialysis Intake (mL) 500 mL  UF Total -Machine (mL) 2500 mL  Net UF (mL) 2000 mL  Tolerated HD Treatment Yes  Post-Hemodialysis Comments tx complete-pt stable  Fistula / Graft Left Forearm Arteriovenous fistula  Placement Date/Time: 09/24/20 1527   Placed prior to admission: No  Orientation: Left  Access Location: Forearm  Access Type: Arteriovenous fistula  Site Condition No complications  Fistula / Graft Assessment Present;Thrill  Status Deaccessed  Hemodialysis Catheter Right Internal jugular  Placement Date/Time: 09/24/20 1508   Placed prior to admission: No  Time Out: Correct patient;Correct site;Correct procedure  Maximum sterile barrier precautions: Mask;Large sterile sheet;Sterile gloves;Cap;Hand hygiene;Sterile gown  Site Prep: Chlorh...  Site Condition No complications  Blue Lumen Status Flushed;Capped (Central line);Heparin locked  Red Lumen Status Flushed;Heparin locked;Capped (Central line)  Catheter fill solution Heparin 1000 units/ml  Catheter fill volume (Arterial) 1.9 cc  Catheter fill volume (Venous) 1.9  Dressing Type Occlusive  Dressing Status Clean;Dry;Intact  Antimicrobial disc in place? Yes  Interventions New dressing;Dressing changed;Antimicrobial disc changed  Drainage Description Other (Comment) (dried blood)  Dressing Change Due 10/08/20  Post treatment catheter status Capped and Clamped  hd complete-pt stable

## 2020-10-01 NOTE — Progress Notes (Signed)
Renal Navigator received message from patient's daughter/[name] who states frustration that she has not received an update yet this morning and "that if I do not receive a call by 11:00am, I will arrive to the hospital with the police to come get my dad to take him home." Navigator informed [name] that her dad is currently in dialysis and will be finished with treatment at approximately 12:45pm. Navigator also informed [name] that he has a new attending on today. Navigator has sent a secure message to attending to inform of situation and request call to daughter when he is able. Navigator notified Therapist, sports of call. Patient's daughter was calm and appropriate on the phone, but states she is just ready to take her father home as he has "only gotten worse the longer he has stayed there (hospital)." Navigator told [name] the hope that she does not decide to take him out of the hospital against medical advise, though it may be determined that he is ready for discharge today. Navigator added that if she takes him out before he is deemed ready, that he may just need to come right back. She said, "no. He is not ever coming back. He will just be hospice at that point." Navigator reviewed with [name] that outpatient Palliative Care has been arranged and she confirmed. Navigator updated Palliative Care NP/A. Parker regarding this, since patient is still a full code and this statement does not seem to align. Navigator asked if she could follow up with daughter if she has a chance.   Alphonzo Cruise, [place] Renal Navigator [phone]

## 2020-10-01 NOTE — Progress Notes (Signed)
Chart reviewed. Improvement in ventricular rate. Continue current medical management. No plans for cardioversion at this time.   Nigel Mormon, MD Pager: [phone] Office: [phone]

## 2020-10-01 NOTE — TOC Transition Note (Signed)
Transition of Care [facility]) - CM/SW Discharge Note   Patient Details  Name: [name]. MRN: [id number] Date of Birth: [birth year]  [facility]) CM/SW Contact:  Carles Collet, RN Phone Number: 10/01/2020, 12:21 PM   Clinical Narrative:    [facility] declined referral, [place] set up with [facility]. DME previously ordered. No other CM needs identified.    Final next level of care: Home w Home Health Services Barriers to Discharge: No Barriers Identified   Patient Goals and CMS Choice Patient states their goals for this hospitalization and ongoing recovery are:: Daughter wants her dad to get ST rehab before returning home as she works [employer].[email or web address] Compare Post Acute Care list provided to:: Other (Comment Required) (Daughter provided CSW with her preferred SNF - [facility]) Choice offered to / list presented to : Adult Children  Discharge Placement                       Discharge Plan and Services In-house Referral: Clinical Social Work   Post Acute Care Choice: Home Health, Durable Medical Equipment          DME Arranged: Wheelchair manual DME Agency: [employer] Date DME Agency Contacted: 09/29/20   Representative spoke with at DME Agency: Freda Munro HH Arranged: RN, PT, OT, Social Work [facility] Agency: [place] Date [place]: 10/01/20 Time [place]: 1215 Representative spoke with at [place]: [place] ([place]) Interventions     Readmission Risk Interventions No flowsheet data found.

## 2020-10-01 NOTE — Discharge Summary (Signed)
[name]., is a 69 y.o. male  DOB 04-19-1951  MRN [id number].  Admission date:  09/08/2020  Admitting Physician  Donne Hazel, MD  Discharge Date:  10/01/2020   Primary MD  Aura Dials, MD   Patient is leaving AMA, I have discussed this at length with the patient, and with his daughter by phone as well, they understanding the risks involved and that, hypertension, A. fib with RVR, and even possible death.  Recommendations for primary care physician for things to follow:  -Follow with cardiology as an outpatient regarding for emergent management of his A. fib with RVR. -Continue hemodialysis as an outpatient. -Follow with orthopedic as an outpatient regarding further management of his osteomyelitis.    Admission Diagnosis  Fall [W19.XXXA] Sepsis ([place]) [A41.9] Other acute osteomyelitis of right foot [facility]) [M86.171]   Discharge Diagnosis  Fall [W19.XXXA] Sepsis ([place]) [A41.9] Other acute osteomyelitis of right foot ([place]) [M86.171]    Principal Problem:   Osteomyelitis of fifth toe of right foot ([place]) Active Problems:   Type 2 diabetes mellitus ([place])   Essential hypertension   Acute on chronic heart failure with preserved ejection fraction (HFpEF) (HCC)   Diabetic foot infection (HCC)   Atrial fibrillation with RVR (HCC)   Acute renal failure superimposed on stage 3a chronic kidney disease (HCC)   Chronic edema   Pressure injury of skin   Acute metabolic encephalopathy   Uncontrolled type 2 diabetes mellitus with hyperglycemia ([place] [address])   Palliative care by specialist   Goals of care, counseling/discussion      Past Medical History:  Diagnosis Date  . Arthritis   . Back pain    occasionally  . BPH (benign prostatic hypertrophy)    takes Uroxatral daily as well as Finasteride   . Diabetes mellitus without complication ([place])    takes Metformin daily  . Diabetic foot infection  ([place]) 10/22/2019  . GERD (gastroesophageal reflux disease)    no meds  . Hepatitis hx of   at age 20  . Hypertension    takes Losartan and HCTZ daily  . Joint pain   . Lymphedema 10/22/2019  . Urinary frequency   . Urinary urgency   . Venous stasis dermatitis 11/19/2019    Past Surgical History:  Procedure Laterality Date  . APPENDECTOMY     age 31  . AV FISTULA PLACEMENT Left 09/24/2020   Procedure: CREATION OF LEFT ARM RADIOCEPHALIC ARTERIOVENOUS (AV) FISTULA;  Surgeon: Rosetta Posner, MD;  Location: [facility];  Service: Vascular;  Laterality: Left;  . COLONOSCOPY    . INSERTION OF DIALYSIS CATHETER Right 09/24/2020   Procedure: INSERTION OF RIGHT INTERNAL JUGULAR TUNNELED DIALYSIS CATHETER;  Surgeon: Rosetta Posner, MD;  Location: [facility];  Service: Vascular;  Laterality: Right;  . IR FLUORO GUIDE CV LINE RIGHT  09/16/2020  . IR US GUIDE VASC ACCESS RIGHT  09/16/2020  . KNEE ARTHROSCOPY  1984   right and left   . SHOULDER ARTHROSCOPY  2003   right RCR-GSC-stayed  overnight-had SOB  . SHOULDER ARTHROSCOPY WITH ROTATOR CUFF REPAIR AND SUBACROMIAL DECOMPRESSION Left 03/13/2013   Procedure: LEFT SHOULDER ARTHROSCOPY WITH SUBACROMIAL DECOMPRESSION, DISTAL CLAVICLE RESECTION AND REPAIR ROTATOR CUFF AND LABRAL DEBRIDEMENT;  Surgeon: [name]., MD;  Location: [facility];  Service: Orthopedics;  Laterality: Left;  . TOTAL HIP ARTHROPLASTY Right 01/29/2015   Procedure: TOTAL HIP ARTHROPLASTY ANTERIOR APPROACH;  Surgeon: [name], MD;  Location: [facility];  Service: Orthopedics;  Laterality: Right;  Marland Kitchen VASECTOMY    . [place]       History of present illness and  Hospital Course:     Kindly see H&P for history of present illness and admission details, please review complete Labs, Consult reports and Test reports for all details in brief  HPI  from the history and physical done on the day of admission 09/08/2020  HPI: [name]. is a 69 y.o. male  with medical history significant of DM2, diastolic chf, hx afib on eliquis, HTN, presents to ED after being found down in the bathroom this AM during welfare check. Per report, pt noted to have an unsanitary living condition and is currently followed by adult protective services. Patient reports falling while standing up from bathroom this AM without LOC, resulting in fall onto R chest and resultant R lower chest pain. Patient was found down and brought into ED.   Of note, patient reports stopping all medications several months ago with exception of torsemide because "I didn't think I needed them anymore." Pt has only been taking torsemide PRN swelling. Denies sob or abd pain. Does complain of R lower chest pain s/p fall on the AM of presentation  ED Course: In the ED, pt found to be tachycardic with HR in the 130's, RR as high as 28 breaths. Temp of 98.48F. WBC noted to be elevated at 20.1k, Cr elevated at 1.98. Lactate was elevated at 2.7. Patient was given empiric broad spectrum abx and IVF hydration. Orthopedic Surgery was consulted who recommended medical admit. Hospitaliist consulted for consideration for admission   Hospital Course   69 year old male with U6JF, chronic systolic and diastolic CHF, PAF supposed to be on Eliquis, HTN, morbid obesity brought to ED 09/08/2020 after he was found down in his house.  Reportedly patient was followed.  Protective services, has very unsanitary living condition at home and was found down in the bathroom during welfare check. Per report: Patient reports falling while standing up from bathroom without LOC, resulting in fall onto R chest and resultant R lower chest pain. Patient was found down and brought into ED. Due to his poor living conditions his daughter was involved in his care, he is Noncompliant in general and very self-neglected with denial component.   He was found to have necrotic foot infections and underwent xray of right foot, which revealed  subcortical lucency involving 5th digit concerning for OM.  Had massive lower extremity edema and apparently had stopped taking torsemide several weeks ago as he did not think he needed it.  Has been seen by general surgery, psychiatry along with Dr. Sharol Given orthopedic surgery, amputation of the right fifth digit was offered but patient declined, he is currently being treated with Augmentin and doxycycline with plans of being on chronic doxycycline post discharge and follow-up with Dr. Sharol Given.  Was started on diuretics this admission unfortunately his renal function continued to decline, he was seen by nephrology and now has progressed to ESRD and is  HD dependent, underwent right IJ tunneled hemodialysis catheter placement and left radiocephalic AV fistula creation by Dr. Donnetta Hutching  He needed a Covid PCR test for SNF placement & also spiked a low grade fever on early 09/27/2020 after which he was tested for COVID-19 and he was positive for the same with some x-ray evidence of pneumonia, of note patient was unvaccinated for COVID-19 till he was hospitalized and got his first vaccine shot on 09/16/2020 with not enough time to mount immunity.  He was transferred to my care on 09/28/2020 on day 20 of his hospital stay.  He is stable after receiving antibody infusion from Covid standpoint.  He is being getting ready for home discharge, outpatient dialysis is set, he was to be discharged on 09/30/2020 unfortunately went into RVR requiring amiodarone drip.  Initial recommendation were for possible TEE and cardioversion, patient and daughter was adamant about early discharge, patient was kept on amiodarone drip till discharge for which he will be discharged on p.o. amiodarone, please see discussion below.   Incidental Covid positive test during hospitalization.  SNF placement related COVID-19 test along with low-grade fever early morning 09/27/2020 showing positive COVID-19 infection diagnosed in the hospital on  09/27/2020, x-ray showing interstitial pattern as well.  Patient was not previously vaccinated but got his Covid vaccine while in this hospital however for short was given on 09/16/2020.  Still does not have full immunity for it.  Patient is currently asymptomatic from pulmonary standpoint, he has consented for Covid antibody infusion, daughter informed as well.  If worse he will get steroids and Remdesivir.  Unfortunately due to active lower extremity infections he is not a candidate for Actemra or Baricitinib.  This plan was clearly given to patient and his daughter [name], daughter [name] on 09/28/2020 requested that patient be given ivermectin, she was told that this is not the hospital COVID-19 protocol and we cannot use ivermectin as we do not have any positive experience with it at this facility.  She also informed me that she is a Therapist, sports and that Covid Vaccines are known to give Covid 19 infections, also some studies are showing that remdesivir is causing a lot of harm to lungs, also informed that such studies have not been reviewed by our COVID-19 team.     PAF with RVR [place] vas 2 score of greater than 3:  -Cardiology input greatly appreciated, patient and daughter want immediate discharge, discussed at length with daughter and patient, I will try to accommodate him leaving today under AMA, and have informed them that he is not stable for discharge, he remains on amiodarone drip for heart rate control with soft blood pressure, I will write prescription for amiodarone bolus and taper per cardiology recommendation, low-dose metoprolol as well, he will be resumed his Eliquis as well, and will start on low-dose midodrine to assist with blood pressure .  Acute on chronic combined systolic and diastolic CHF: LVEF 16-07% in LVEF, previously back in March EF was 40-45%: TTE shows lvef 30-35%, severe concentric LVH, global hypokinesia.  Now dialysis dependent and volume will be managed in dialysis.  Blood  pressure and renal function prohibits the use of beta-blocker, ACE/ARB or Entresto.  AKI on CKD stage III with baseline creatinine 1.3-1.9 now ESRD: Seen by Nephrology - etiology multifactorial including cardiorenal syndrome, ATN.  Now on dialysis since 11/3 and tolerating well, at times episode of hypotension and is started on midodrine.  S/P Rtt IJ tunneled HD  catheter placement and left  radiocephalic AV fistula creation by Dr. Donnetta Hutching 11/10.  Continue dialysis per Renal.   Extensive bilateral lower extremity edema wound on right foot,Osteomyelitis of fifth toe of right foot per x-ray, ABI indeterminate due to lower extremity edema.  Seen by Dr. Sharol Given offered amputation but patient refused.  Family agreed with patient decision not to pursue amputation.  Plan is to continue aggressive wound care, outpatient doxycycline long-term, follow-up with Dr. Sharol Given as outpatient, continue wound care, patient clearly understands the risk of not undergoing surgical amputation and that it can make him septic and can cause death and disability.  Hypotension: BP soft, continue midodrine. Likely multifactorial, including low EF.  Tarted on low-dose midodrineChronic lymphedema: Continue Una boot, close wound care follow-up.  Fluid removal via HD.  Falls at home/neuropathy: Continue PT OT, will need skilled nursing facility placement vs HHPT.  Patient prefers home health at this time.  Severe sepsis POA: Sepsis physiology has resolved.  Acute encephalopathy, uremic/septic on presentation per chart.  Resolved.  Hematuria after placing foley catheter suspect catheter related trauma: Now resolved Foley removed 09/23/2020.  Morbid obesity with BMI 43>41, he will benefit with weight loss healthy lifestyle outpatient sleep apnea evaluation but I am not sure how much compliant of feasible it is for him.  Type 2 diabetes mellitus: A1c uncontrolled 9.6 10/25.  Blood sugar is controlled on sliding scale.  Blood sugar  stable on sliding scale insulin    Discharge Condition:  Patient left AMA, discussed with patient and daughter by phone at length, explained risks including hypotension, sepsis and even possible death   Follow UP   Follow-up Information    Newt Minion, MD Follow up in 1 week(s).   Specialty: Orthopedic Surgery Contact information: [address] [place] [address] [phone]        Rosetta Posner, MD Follow up.   Specialties: Vascular Surgery, Cardiology Why: Office will call you with follow-up appointment (sent) Contact information: [place] [address] [place], [facility] Follow up.   Specialty: Home Health Services Why: The home health agency will call you for the first home visit.  Contact information: [place] [address] [phone]        Nigel Mormon, MD Follow up in 1 week(s).   Specialties: Cardiology, Radiology Contact information: [place] [address] [phone]                 Discharge Instructions  and  Discharge Medications     Discharge Instructions    Discharge instructions   Complete by: As directed    Follow with Primary MD Aura Dials, MD in 3 days   Get CBC, CMP,  checked  by Primary MD next visit.    Activity: As tolerated with Full fall precautions use walker/cane & assistance as needed   Disposition Home    Diet: Renal diet, with feeding assistance and aspiration precautions.  For Heart failure patients - Check your Weight same time everyday, if you gain over 2 pounds, or you develop in leg swelling, experience more shortness of breath or chest pain, call your Primary MD immediately. Follow Cardiac Low Salt Diet and 1.5 lit/day fluid restriction.   On your next visit with your primary care physician please Get Medicines reviewed and adjusted.   Please request your Prim.MD to go over all Hospital  Tests and Procedure/Radiological results at the follow up,  please get all Hospital records sent to your Prim MD by signing hospital release before you go home.   If you experience worsening of your admission symptoms, develop shortness of breath, life threatening emergency, suicidal or homicidal thoughts you must seek medical attention immediately by calling 911 or calling your MD immediately  if symptoms less severe.  You Must read complete instructions/literature along with all the possible adverse reactions/side effects for all the Medicines you take and that have been prescribed to you. Take any new Medicines after you have completely understood and accpet all the possible adverse reactions/side effects.   Do not drive, operating heavy machinery, perform activities at heights, swimming or participation in water activities or provide baby sitting services if your were admitted for syncope or siezures until you have seen by Primary MD or a Neurologist and advised to do so again.  Do not drive when taking Pain medications.    Do not take more than prescribed Pain, Sleep and Anxiety Medications  Special Instructions: If you have smoked or chewed Tobacco  in the last 2 yrs please stop smoking, stop any regular Alcohol  and or any Recreational drug use.  Wear Seat belts while driving.   Please note  You were cared for by a hospitalist during your hospital stay. If you have any questions about your discharge medications or the care you received while you were in the hospital after you are discharged, you can call the unit and asked to speak with the hospitalist on call if the hospitalist that took care of you is not available. Once you are discharged, your primary care physician will handle any further medical issues. Please note that NO REFILLS for any discharge medications will be authorized once you are discharged, as it is imperative that you return to your primary care physician (or establish  a relationship with a primary care physician if you do not have one) for your aftercare needs so that they can reassess your need for medications and monitor your lab values.   Discharge wound care:   Complete by: As directed    Remove bilateral Profore wraps, wash legs with soap and water. Place foam dressings over the wounds on the plantar surface of the right foot. Beginning behind the toes and going to just below the knee, spiral wrap kerlex, then Ace bandages. Change daily and prn soilage.   Increase activity slowly   Complete by: As directed      Allergies as of 10/01/2020      Reactions   Codeine Swelling   Water retention   Sulfur Other (See Comments)   Not sure of reaction a young child      Medication List    STOP taking these medications   potassium chloride SA 20 MEQ tablet Commonly known as: KLOR-CON   torsemide 20 MG tablet Commonly known as: DEMADEX     TAKE these medications   acetaminophen 325 MG tablet Commonly known as: TYLENOL Take 2 tablets (650 mg total) by mouth every 6 (six) hours as needed for mild pain (or Fever >/= 101).   amiodarone 200 MG tablet Commonly known as: Pacerone PO Amiodarone 400 mg tid for 7 days, then reduce to 200 mg bid on 10/08/2020   apixaban 5 MG Tabs tablet Commonly known as: ELIQUIS Take 1 tablet (5 mg total) by mouth 2 (two) times daily.   doxycycline 100 MG tablet Commonly known as: VIBRA-TABS Take 1 tablet (100 mg total) by mouth every 12 (  twelve) hours.   melatonin 3 MG Tabs tablet Take 2 tablets (6 mg total) by mouth at bedtime as needed (sleep).   metoprolol tartrate 25 MG tablet Commonly known as: LOPRESSOR Take 0.5 tablets (12.5 mg total) by mouth 2 (two) times daily. What changed:   medication strength  how much to take   midodrine 2.5 MG tablet Commonly known as: PROAMATINE Take 1 tablet (2.5 mg total) by mouth 2 (two) times daily with a meal.   multivitamin Tabs tablet Take 1 tablet by mouth at  bedtime.   pantoprazole 40 MG tablet Commonly known as: Protonix Take 1 tablet (40 mg total) by mouth daily.            Durable Medical Equipment  (From admission, onward)         Start     Ordered   09/29/20 1441  For home use only DME lightweight manual wheelchair with seat cushion  Once       Comments: Patient suffers from osteomyelitis of toes which impairs their ability to perform daily activities like ambulating in the home.  A walker will not resolve  issue with performing activities of daily living. A wheelchair will allow patient to safely perform daily activities. Patient is not able to propel themselves in the home using a standard weight wheelchair due to weakness. Patient can self propel in the lightweight wheelchair. Length of need Lifetime. Accessories: elevating leg rests (ELRs), wheel locks, extensions and anti-tippers.   09/29/20 1441           Discharge Care Instructions  (From admission, onward)         Start     Ordered   10/01/20 0000  Discharge wound care:       Comments: Remove bilateral Profore wraps, wash legs with soap and water. Place foam dressings over the wounds on the plantar surface of the right foot. Beginning behind the toes and going to just below the knee, spiral wrap kerlex, then Ace bandages. Change daily and prn soilage.   10/01/20 1121            Diet and Activity recommendation: See Discharge Instructions above   Consults obtained - Cards, ortho, Renal   Major procedures and Radiology Reports - PLEASE review detailed and final reports for all details, in brief -      DG Chest 1 View  Result Date: 09/24/2020 CLINICAL DATA:  69 year old male with central line placement. EXAM: CHEST  1 VIEW COMPARISON:  Chest radiograph dated 09/12/2020 FINDINGS: Right IJ central venous line with tip close to the cavoatrial junction. No pneumothorax. There is no focal consolidation, or pleural effusion. The cardiac silhouette is within  limits. Atherosclerotic calcification of the aorta. There is mild vascular congestion. No acute osseous pathology. IMPRESSION: 1. Right IJ central venous line with tip close to the cavoatrial junction. No pneumothorax. 2. Mild vascular congestion. Electronically Signed   By: Anner Crete M.D.   On: 09/24/2020 18:01   US RENAL  Result Date: 09/14/2020 CLINICAL DATA:  Acute renal failure EXAM: RENAL / URINARY TRACT ULTRASOUND COMPLETE COMPARISON:  Abdominal ultrasound dated 02/02/2016. FINDINGS: The exam is limited due to patient's body habitus and ability to participate in the exam. Right Kidney: Renal measurements: 14.5 x 6.0 x 7.2 cm = volume: 332 mL. Echogenicity within normal limits. No mass or hydronephrosis visualized. Left Kidney: Renal measurements: 14.6 x 5.6 x 6.4 cm = volume: 276 mL. Echogenicity within normal limits. No mass or hydronephrosis  visualized. Bladder: Appears normal for degree of bladder distention. IMPRESSION: No hydronephrosis given the limitations of the exam. Electronically Signed   By: Zerita Boers M.D.   On: 09/14/2020 15:07   IR Fluoro Guide CV Line Right  Result Date: 09/16/2020 INDICATION: 69 year old male with a history of renal failure EXAM: IMAGE GUIDED PLACEMENT OF NON TUNNELED HEMODIALYSIS CATHETER MEDICATIONS: None ANESTHESIA/SEDATION: None FLUOROSCOPY TIME:  Fluoroscopy Time: 1 minutes 42 seconds (52 mGy). COMPLICATIONS: None PROCEDURE: Informed written consent was obtained from the patient and the patient's family after a thorough discussion of the procedural risks, benefits and alternatives. All questions were addressed. A timeout was performed prior to the initiation of the procedure. The right neck and chest was prepped with chlorhexidine, and draped in the usual sterile fashion using maximum barrier technique (cap and mask, sterile gown, sterile gloves, large sterile sheet, hand hygiene and cutaneous antiseptic). Local anesthesia was attained by infiltration  with 1% lidocaine without epinephrine. Ultrasound demonstrated patency of the right internal jugular vein, and this was documented with an image. Under real-time ultrasound guidance, this vein was accessed with a 21 gauge micropuncture needle and image documentation was performed. A small dermatotomy was made at the access site with an 11 scalpel. A 0.018" wire was advanced into the SVC and the access needle exchanged for a 62F micropuncture vascular sheath. The 0.018" wire was then removed and a 0.035" wire advanced into the IVC. A 20 cm catheter was selected. Skin and subcutaneous tissues were serially dilated. Catheter was placed on the wire. The catheter tip is positioned in the upper right atrium. This was documented with a spot image. Both ports of the hemodialysis catheter were then tested for excellent function. The ports were then locked with heparinized lock. Patient tolerated the procedure well and remained hemodynamically stable throughout. No complications were encountered and no significant blood loss was encountered. IMPRESSION: Status post image guided placement temporary hemodialysis catheter via right IJ approach Signed, Dulcy Fanny. Dellia Nims, RPVI [facility] Rad[facility] Electronically Signed   By: Corrie Mckusick D.O.   On: 09/16/2020 12:45   IR US Guide Vasc Access Right  Result Date: 09/16/2020 INDICATION: 69 year old male with a history of renal failure EXAM: IMAGE GUIDED PLACEMENT OF NON TUNNELED HEMODIALYSIS CATHETER MEDICATIONS: None ANESTHESIA/SEDATION: None FLUOROSCOPY TIME:  Fluoroscopy Time: 1 minutes 42 seconds (52 mGy). COMPLICATIONS: None PROCEDURE: Informed written consent was obtained from the patient and the patient's family after a thorough discussion of the procedural risks, benefits and alternatives. All questions were addressed. A timeout was performed prior to the initiation of the procedure. The right neck and chest was prepped  with chlorhexidine, and draped in the usual sterile fashion using maximum barrier technique (cap and mask, sterile gown, sterile gloves, large sterile sheet, hand hygiene and cutaneous antiseptic). Local anesthesia was attained by infiltration with 1% lidocaine without epinephrine. Ultrasound demonstrated patency of the right internal jugular vein, and this was documented with an image. Under real-time ultrasound guidance, this vein was accessed with a 21 gauge micropuncture needle and image documentation was performed. A small dermatotomy was made at the access site with an 11 scalpel. A 0.018" wire was advanced into the SVC and the access needle exchanged for a 62F micropuncture vascular sheath. The 0.018" wire was then removed and a 0.035" wire advanced into the IVC. A 20 cm catheter was selected. Skin and subcutaneous tissues were serially dilated. Catheter was placed on the wire. The catheter tip is  positioned in the upper right atrium. This was documented with a spot image. Both ports of the hemodialysis catheter were then tested for excellent function. The ports were then locked with heparinized lock. Patient tolerated the procedure well and remained hemodynamically stable throughout. No complications were encountered and no significant blood loss was encountered. IMPRESSION: Status post image guided placement temporary hemodialysis catheter via right IJ approach Signed, Dulcy Fanny. Dellia Nims, RPVI [facility] Radiology Electronically Signed   By: Corrie Mckusick D.O.   On: 09/16/2020 12:45   DG Chest Port 1 View  Result Date: 09/28/2020 CLINICAL DATA:  Shortness of breath.  COVID-19 positive EXAM: PORTABLE CHEST 1 VIEW COMPARISON:  September 24, 2020 FINDINGS: Central catheter tip is in the right atrium without pneumothorax. There is patchy airspace opacity in the right mid lung and lung base regions, slightly increased from most recent study. Heart size and  pulmonary vascularity are normal. No adenopathy. There is aortic atherosclerosis. Evidence of prior surgery or trauma involving each lateral clavicle noted. IMPRESSION: Patchy airspace opacity in the right mid lung and both base regions, likely due to atypical organism pneumonia. No consolidation. Increased opacity compared to most recent chest radiograph. Stable cardiac silhouette. Central catheter tip in right atrium without pneumothorax. Aortic Atherosclerosis (ICD10-I70.0). Electronically Signed   By: Lowella Grip III M.D.   On: 09/28/2020 08:45   DG CHEST PORT 1 VIEW  Result Date: 09/12/2020 CLINICAL DATA:  Shortness of breath EXAM: PORTABLE CHEST 1 VIEW COMPARISON:  01/16/2019 FINDINGS: Borderline cardiomegaly with vascular congestion. No consolidation or effusion. Aortic atherosclerosis. No pneumothorax IMPRESSION: Borderline cardiomegaly with vascular congestion. Electronically Signed   By: Donavan Foil M.D.   On: 09/12/2020 23:34   DG Foot Complete Right  Result Date: 09/08/2020 CLINICAL DATA:  Bone deep wound on right foot, extensive gangrene in both legs EXAM: RIGHT FOOT COMPLETE - 3+ VIEW COMPARISON:  None. FINDINGS: Extensive soft tissue swelling/thickening of the lower extremity. Reported site of ulceration is difficult to visualize radiographically. Suspect some ulceration along the plantar aspect of the calcaneus and involving the digits. Extensive radiodense foreign bodies projecting over digits appear external to the soft tissues. Some additional mineralization is noted along the plantar surface as well. There is a likely fracture dislocation of the fifth proximal interphalangeal joint with dorsal-lateral subluxation. Additional clawtoe deformities of the second through fourth rays. Marked subcortical lucency of the head of the fifth and to a lesser extent fourth metatarsals raise suspicion for potential osteomyelitis. Correlate for sites of ulceration and clinical findings.  Atherosclerotic calcification of the soft tissues. IMPRESSION: 1. Extensive soft tissue swelling/thickening of the lower extremity. 2. Suspect ulceration along the plantar aspect of the calcaneus and involving the digits/head of the metatarsals. 3. Subcortical lucency particularly involving the head of the fifth and to a lesser extent fourth metatarsals raises suspicion for possible osteomyelitis. 4. Likely fracture dislocation of the fifth proximal interphalangeal joint with dorsal-lateral subluxation. 5. Extensive radiodense foreign bodies projecting along the plantar skin surface and about the digits, correlate with visual inspection. Electronically Signed   By: Lovena Le M.D.   On: 09/08/2020 16:26   DG Fluoro Guide CV Line-No Report  Result Date: 09/24/2020 Fluoroscopy was utilized by the requesting physician.  No radiographic interpretation.   VAS [place] ABI WITH/WO TBI  Result Date: 09/10/2020 LOWER EXTREMITY DOPPLER STUDY Indications: Ulceration, and gangrene. High Risk Factors: Hypertension, Diabetes. Other Factors: Venous stasis dermatitis.  Limitations: Today's exam was limited  due to significant wounds and extremely              thick, crusted skin throughout bilateral calves, ankles, and feet,              limiting/prohibiting the penetration of ultrasound signal. Comparison Study: 10/03/2019- ABI Performing Technologist: Maudry Mayhew MHA, RVT, RDCS, RDMS  Examination Guidelines: A complete evaluation includes at minimum, Doppler waveform signals and systolic blood pressure reading at the level of bilateral brachial, anterior tibial, and posterior tibial arteries, when vessel segments are accessible. Bilateral testing is considered an integral part of a complete examination. Photoelectric Plethysmograph (PPG) waveforms and toe systolic pressure readings are included as required and additional duplex testing as needed. Limited examinations for reoccurring indications may be performed as  noted.  ABI Findings: +---------+------------------+-----+--------+----------------------------------+ Right    Rt Pressure (mmHg)IndexWaveformComment                            +---------+------------------+-----+--------+----------------------------------+ Brachial                                Unable to evaluate due to IV                                               location                           +---------+------------------+-----+--------+----------------------------------+ PTA                                     Unable to insonate due to                                                  technical limitations              +---------+------------------+-----+--------+----------------------------------+ DP                              biphasicUnable to obtain pressure due to                                           technical limitations              +---------+------------------+-----+--------+----------------------------------+ Great Toe                       Present                                    +---------+------------------+-----+--------+----------------------------------+ +---------+------------------+-----+--------+----------------------------------+ Left     Lt Pressure (mmHg)IndexWaveformComment                            +---------+------------------+-----+--------+----------------------------------+ Brachial  Not evaluated                      +---------+------------------+-----+--------+----------------------------------+ PTA                                     Unable to insonate due to                                                  technical limitations              +---------+------------------+-----+--------+----------------------------------+ DP                                      Unable to insonate due to                                                  technical  limitations              +---------+------------------+-----+--------+----------------------------------+ Great Toe                       Present                                    +---------+------------------+-----+--------+----------------------------------+  Summary: Bilateral: Extremely limited exam due to technical limtiations. There is evidence of perfusion through to bilateral great toes, however unable to quantify due to technical limitations.  *See table(s) above for measurements and observations.  Electronically signed by Harold Barban MD on 09/10/2020 at 8:55:32 PM.    Final    ECHOCARDIOGRAM COMPLETE  Result Date: 09/18/2020    ECHOCARDIOGRAM REPORT   Patient Name:   [name] [facility]. Date of Exam: 09/18/2020 Medical Rec #:  [id number]           Height:       69.0 in Accession #:    [id number]          Weight:       292.1 lb Date of Birth:  [birth year]            BSA:          2.427 m Patient Age:    14 years            BP:           95/84 mmHg Patient Gender: M                   HR:           96 bpm. Exam Location:  Inpatient Procedure: 2D Echo, Cardiac Doppler, Color Doppler and Intracardiac            Opacification Agent Indications:    Cardiorenal syndrome. ; I50.9* Heart failure (unspecified)  History:        Patient has prior history of Echocardiogram examinations, most                 recent 01/31/2020. Abnormal ECG, Arrythmias:Atrial Fibrillation;  Risk Factors:Hypertension and Diabetes.  Sonographer:    Roseanna Rainbow RDCS Referring Phys: [id number] [facility]  Sonographer Comments: Image acquisition challenging due to patient body habitus. IMPRESSIONS  1. Heavy smoke in the left ventricle consistent with pre-thrombotic state, no thrombus was seen on Definity echocontrast images.  2. Left ventricular ejection fraction, by estimation, is 30 to 35%. The left ventricle has moderately decreased function. The left ventricle demonstrates global hypokinesis. There is severe concentric  left ventricular hypertrophy. Left ventricular diastolic function could not be evaluated. There is the interventricular septum is flattened in systole and diastole, consistent with right ventricular pressure and volume overload.  3. Right ventricular systolic function is moderately reduced. The right ventricular size is moderately enlarged. There is mildly elevated pulmonary artery systolic pressure. The estimated right ventricular systolic pressure is 37.0 mmHg.  4. Left atrial size was severely dilated.  5. Right atrial size was moderately dilated.  6. The mitral valve is normal in structure. Mild mitral valve regurgitation. No evidence of mitral stenosis.  7. Tricuspid valve regurgitation is moderate.  8. The aortic valve is normal in structure. Aortic valve regurgitation is not visualized. No aortic stenosis is present.  9. The inferior vena cava is normal in size with greater than 50% respiratory variability, suggesting right atrial pressure of 3 mmHg. FINDINGS  Left Ventricle: Left ventricular ejection fraction, by estimation, is 30 to 35%. The left ventricle has moderately decreased function. The left ventricle demonstrates global hypokinesis. Definity contrast agent was given IV to delineate the left ventricular endocardial borders. The left ventricular internal cavity size was normal in size. There is severe concentric left ventricular hypertrophy. Abnormal (paradoxical) septal motion, consistent with left bundle branch block and the interventricular septum is flattened in systole and diastole, consistent with right ventricular pressure and volume overload. Left ventricular diastolic function could not be evaluated due to atrial fibrillation. Left ventricular diastolic function could  not be evaluated. Right Ventricle: The right ventricular size is moderately enlarged. No increase in right ventricular wall thickness. Right ventricular systolic function is moderately reduced. There is mildly elevated  pulmonary artery systolic pressure. The tricuspid regurgitant velocity is 2.49 m/s, and with an assumed right atrial pressure of 8 mmHg, the estimated right ventricular systolic pressure is 48.8 mmHg. Left Atrium: Left atrial size was severely dilated. Right Atrium: Right atrial size was moderately dilated. Pericardium: There is no evidence of pericardial effusion. Mitral Valve: The mitral valve is normal in structure. Mild mitral valve regurgitation. No evidence of mitral valve stenosis. Tricuspid Valve: The tricuspid valve is normal in structure. Tricuspid valve regurgitation is moderate . No evidence of tricuspid stenosis. Aortic Valve: The aortic valve is normal in structure. Aortic valve regurgitation is not visualized. No aortic stenosis is present. Pulmonic Valve: The pulmonic valve was normal in structure. Pulmonic valve regurgitation is not visualized. No evidence of pulmonic stenosis. Aorta: The aortic root is normal in size and structure. Venous: The inferior vena cava is normal in size with greater than 50% respiratory variability, suggesting right atrial pressure of 3 mmHg. IAS/Shunts: No atrial level shunt detected by color flow Doppler.  LEFT VENTRICLE PLAX 2D LVIDd:         4.80 cm LVIDs:         4.30 cm LV PW:         2.20 cm LV IVS:        1.60 cm LVOT diam:     2.40 cm LV SV:  16 LV SV Index:   23 LVOT Area:     4.52 cm  LV Volumes (MOD) LV vol d, MOD A2C: 113.0 ml LV vol d, MOD A4C: 150.0 ml LV vol s, MOD A2C: 59.9 ml LV vol s, MOD A4C: 75.3 ml LV SV MOD A2C:     53.1 ml LV SV MOD A4C:     150.0 ml LV SV MOD BP:      68.9 ml RIGHT VENTRICLE            IVC RV S prime:     6.85 cm/s  IVC diam: 3.00 cm TAPSE (M-mode): 1.2 cm LEFT ATRIUM             Index       RIGHT ATRIUM           Index LA diam:        5.80 cm 2.39 cm/m  RA Area:     16.70 cm LA Vol (A2C):   64.7 ml 26.66 ml/m RA Volume:   38.90 ml  16.03 ml/m LA Vol (A4C):   87.3 ml 35.97 ml/m LA Biplane Vol: 80.0 ml 32.96 ml/m   AORTIC VALVE LVOT Vmax:   86.20 cm/s LVOT Vmean:  49.500 cm/s LVOT VTI:    0.126 m  AORTA Ao Root diam: 3.85 cm Ao Asc diam:  3.70 cm MITRAL VALVE               TRICUSPID VALVE MV Area (PHT): 4.97 cm    TR Peak grad:   24.8 mmHg MV Decel Time: 153 msec    TR Vmax:        249.00 cm/s MV E velocity: 68.43 cm/s                            SHUNTS                            Systemic VTI:  0.13 m                            Systemic Diam: 2.40 cm Ena Dawley MD Electronically signed by Ena Dawley MD Signature Date/Time: 09/18/2020/12:18:16 PM    Final     Micro Results     Recent Results (from the past 240 hour(s))  SARS Coronavirus 2 by RT PCR (hospital order, performed in [place] hospital lab) Nasopharyngeal Nasopharyngeal Swab     Status: Abnormal   Collection Time: 09/27/20 10:03 AM   Specimen: Nasopharyngeal Swab  Result Value Ref Range Status   SARS Coronavirus 2 POSITIVE (A) NEGATIVE Final    Comment: RESULT CALLED TO, READ BACK BY AND VERIFIED WITH: RN C APPLLEWHITE [id number] AT [id number] BY CM (NOTE) SARS-CoV-2 target nucleic acids are DETECTED  SARS-CoV-2 RNA is generally detectable in upper respiratory specimens  during the acute phase of infection.  Positive results are indicative  of the presence of the identified virus, but do not rule out bacterial infection or co-infection with other pathogens not detected by the test.  Clinical correlation with patient history and  other diagnostic information is necessary to determine patient infection status.  The expected result is negative.  Fact Sheet for Patients:   StrictlyIdeas.no   Fact Sheet for Healthcare Providers:   BankingDealers.co.za    This test is not yet approved or cleared by the [place]  FDA and  has been authorized for detection and/or diagnosis of SARS-CoV-2 by FDA under an Emergency Use Authorization (EUA).  This EUA will remain in effect (meaning this  test  can be used) for the duration of  the COVID-19 declaration under Section 564(b)(1) of the Act, 21 U.S.C. section 360-bbb-3(b)(1), unless the authorization is terminated or revoked sooner.  Performed at [facility], [place] [address] [place], [place] [address]   Resp Panel by RT PCR (RSV, Flu A&B, Covid) - Nasopharyngeal Swab     Status: Abnormal   Collection Time: 09/27/20 12:29 PM   Specimen: Nasopharyngeal Swab  Result Value Ref Range Status   SARS Coronavirus 2 by RT PCR POSITIVE (A) NEGATIVE Final    Comment: RESULT CALLED TO, READ BACK BY AND VERIFIED WITH: RN R AUGUSTUS [id number] AT 1404 BY CM (NOTE) SARS-CoV-2 target nucleic acids are DETECTED.  SARS-CoV-2 RNA is generally detectable in upper respiratory specimens  during the acute phase of infection. Positive results are indicative of the presence of the identified virus, but do not rule out bacterial infection or co-infection with other pathogens not detected by the test. Clinical correlation with patient history and other diagnostic information is necessary to determine patient infection status. The expected result is Negative.  Fact Sheet for Patients:  PinkCheek.be  Fact Sheet for Healthcare Providers: GravelBags.it  This test is not yet approved or cleared by the [place] FDA and  has been authorized for detection and/or diagnosis of SARS-CoV-2 by FDA under an Emergency Use Authorization (EUA).  This EUA will remain in effect (meaning this test can be  used) for the duration of  the COVID-19 declaration under Section 564(b)(1) of the Act, 21 U.S.C. section 360bbb-3(b)(1), unless the authorization is terminated or revoked sooner.      Influenza A by PCR NEGATIVE NEGATIVE Final   Influenza B by PCR NEGATIVE NEGATIVE Final    Comment: (NOTE) The Xpert Xpress SARS-CoV-2/FLU/RSV assay is intended as an aid in  the diagnosis of influenza from  Nasopharyngeal swab specimens and  should not be used as a sole basis for treatment. Nasal washings and  aspirates are unacceptable for Xpert Xpress SARS-CoV-2/FLU/RSV  testing.  Fact Sheet for Patients: PinkCheek.be  Fact Sheet for Healthcare Providers: GravelBags.it  This test is not yet approved or cleared by the [place] FDA and  has been authorized for detection and/or diagnosis of SARS-CoV-2 by  FDA under an Emergency Use Authorization (EUA). This EUA will remain  in effect (meaning this test can be used) for the duration of the  Covid-19 declaration under Section 564(b)(1) of the Act, 21  U.S.C. section 360bbb-3(b)(1), unless the authorization is  terminated or revoked.    Respiratory Syncytial Virus by PCR NEGATIVE NEGATIVE Final    Comment: (NOTE) Fact Sheet for Patients: PinkCheek.be  Fact Sheet for Healthcare Providers: GravelBags.it  This test is not yet approved or cleared by the [place] FDA and  has been authorized for detection and/or diagnosis of SARS-CoV-2 by  FDA under an Emergency Use Authorization (EUA). This EUA will remain  in effect (meaning this test can be used) for the duration of the  COVID-19 declaration under Section 564(b)(1) of the Act, 21 U.S.C.  section 360bbb-3(b)(1), unless the authorization is terminated or  revoked. Performed at [facility], [place] [address] [place], [place] [address]   MRSA PCR Screening     Status: None   Collection Time: 09/28/20  8:30 AM  Specimen: Nasal Mucosa; Nasopharyngeal  Result Value Ref Range Status   MRSA by PCR NEGATIVE NEGATIVE Final    Comment:        The GeneXpert MRSA Assay (FDA approved for NASAL specimens only), is one component of a comprehensive MRSA colonization surveillance program. It is not intended to diagnose MRSA infection nor to guide or monitor treatment  for MRSA infections. Performed at [facility], [place] [address], [place], [place] [address]        Today   Subjective:   [name] today denies any chest pain, shortness of breath, reports good bowel movement, and good appetite   Objective:   Blood pressure (!) 98/51, pulse (!) 102, temperature 98.2 F (36.8 C), temperature source Oral, resp. rate 18, height 5\' 9"  (1.753 m), weight 115.1 kg, SpO2 94 %.   Intake/Output Summary (Last 24 hours) at 10/01/2020 1238 Last data filed at 10/01/2020 0400 Gross per 24 hour  Intake 912.82 ml  Output --  Net 912.82 ml    Exam Awake Alert, Oriented x 3, frail, No new F.N deficits, Normal affect, sitting in HD bed in NAD Symmetrical Chest wall movement, fair air entry B/L. Irr Irr ,No Gallops,Rubs or new Murmurs, No Parasternal Heave +ve B.Sounds, Abd Soft, Non tender Lower ext with ACE bandage B/L.  Data Review   CBC w Diff:  Lab Results  Component Value Date   WBC 4.5 10/01/2020   HGB 10.2 (L) 10/01/2020   HCT 32.7 (L) 10/01/2020   PLT 217 10/01/2020   LYMPHOPCT 29 10/01/2020   MONOPCT 12 10/01/2020   EOSPCT 1 10/01/2020   BASOPCT 0 10/01/2020    CMP:  Lab Results  Component Value Date   NA 133 (L) 10/01/2020   NA 142 07/20/2018   K 3.9 10/01/2020   CL 96 (L) 10/01/2020   CO2 20 (L) 10/01/2020   BUN 70 (H) 10/01/2020   BUN 25 07/20/2018   CREATININE 8.06 (H) 10/01/2020   CREATININE 1.39 (H) 10/22/2019   PROT 7.1 10/01/2020   ALBUMIN 2.5 (L) 10/01/2020   BILITOT 0.7 10/01/2020   ALKPHOS 66 10/01/2020   AST 174 (H) 10/01/2020   ALT 82 (H) 10/01/2020  .   Total Time in preparing paper work, data evaluation and todays exam - 67 minutes  Phillips Climes M.D on 10/01/2020 at 12:38 PM  [facility]   Office  [phone]

## 2020-10-01 NOTE — Progress Notes (Signed)
[facility] ROUNDING NOTE   Subjective:   Brief history 69 year old with history of congestive heart failure diabetes atrial fibrillation hypertension found down during a welfare check.  He has diabetic foot infection acute kidney injury.  Baseline serum creatinine 1.3 to 1.9 mg/dL.  Started dialysis 09/17/2020 has been tolerating well.  There are no signs of renal recovery.  Palliative care meeting 09/23/2020 agreed to dialysis and permanent access.  Right IJ TDC and left radiocephalic AV fistula created by Dr. Donnetta Hutching 09/24/2020 last dialysis treatment was 09/29/2020 with 1.5 L removed.  He will now dialyze on a Monday Wednesday Friday shift and his next dialysis treatment will be due 10/01/2020.  He is to be discharged.  He will start on the Covid third shift at [facility] kidney center.  Blood pressure 90/67 pulse 110 temperature 98.3 O2 sats 94% room air  Labs pending 10/01/2020   Objective:  Vital signs in last 24 hours:  Temp:  [98 F (36.7 C)-98.7 F (37.1 C)] 98.3 F (36.8 C) (11/17 0509) Pulse Rate:  [100-155] 107 (11/17 0509) Resp:  [12-23] 18 (11/17 0509) BP: (76-122)/(55-99) 78/65 (11/17 0509) SpO2:  [91 %-97 %] 94 % (11/17 0509) Weight:  [119 kg] 119 kg (11/17 0500)  Weight change: -1.7 kg Filed Weights   09/29/20 1515 09/29/20 1815 10/01/20 0500  Weight: 120.7 kg 119.2 kg 119 kg    Intake/Output: I/O last 3 completed shifts: In: 1872.8 [P.O.:1320; I.V.:552.8] Out: -    Intake/Output this shift:  No intake/output data recorded.  General:NAD, sitting on chair, denies any symptoms Heart:RRR, s1s2 nl Lungs: Basal decreased breath sound Abdomen:soft, Non-tender, non-distended Extremities: Edema present and chronic stasis changes. Dialysis Access: Right IJ TDC, left upper extremity AV fistula site has no bleeding.  Minimal swelling Neurology: Alert awake and oriented x3.  Basic Metabolic Panel: Recent Labs  Lab 09/26/20 0144 09/26/20 0144  09/28/20 0545 09/28/20 0545 09/28/20 0811 09/29/20 0238 09/29/20 0307 09/30/20 0105  NA 136  --  138  --   --  134* 137 137  K 3.9  --  3.5  --   --  3.4* 3.5 3.9  CL 101  --  101  --   --  101 100 99  CO2 22  --  21*  --   --  20* 16* 26  GLUCOSE 140*  --  97  --   --  106* 96 93  BUN 60*  --  53*  --   --  46* 45* 36*  CREATININE 6.79*  --  7.23*  --   --  6.61* 6.66* 5.73*  CALCIUM 8.3*  8.3*   < > 8.5*   < >  --  8.2* 8.4* 8.1*  MG  --   --   --   --  1.8 2.1  --  1.9  PHOS 6.9*  --  6.4*  --   --   --  6.0*  --    < > = values in this interval not displayed.    Liver Function Tests: Recent Labs  Lab 09/26/20 0144 09/28/20 0545 09/29/20 0238 09/29/20 0307 09/30/20 0105  AST  --   --  234*  --  241*  ALT  --   --  89*  --  101*  ALKPHOS  --   --  58  --  57  BILITOT  --   --  0.6  --  0.7  PROT  --   --  7.8  --  7.4  ALBUMIN 3.1* 2.9* 2.8* 2.8* 2.6*   No results for input(s): LIPASE, AMYLASE in the last 168 hours. No results for input(s): AMMONIA in the last 168 hours.  CBC: Recent Labs  Lab 09/26/20 0144 09/28/20 0545 09/29/20 0238 09/29/20 2230 09/30/20 0105  WBC 8.6 6.1 5.3 5.1 4.8  NEUTROABS  --   --  3.9  --  3.9  HGB 10.6* 10.9* 11.3* 11.5* 10.6*  HCT 34.0* 35.3* 36.1* 36.4* 34.6*  MCV 85.6 85.3 85.1 85.0 85.9  PLT 193 195 212 205 202    Cardiac Enzymes: No results for input(s): CKTOTAL, CKMB, CKMBINDEX, TROPONINI in the last 168 hours.  BNP: Invalid input(s): POCBNP  CBG: Recent Labs  Lab 09/29/20 2135 09/30/20 0719 09/30/20 1141 09/30/20 1805 09/30/20 2236  GLUCAP 87 85 102* 109* 104*    Microbiology: Results for orders placed or performed during the hospital encounter of 09/08/20  Respiratory Panel by RT PCR (Flu A&B, Covid) - Nasopharyngeal Swab     Status: None   Collection Time: 09/08/20  5:13 PM   Specimen: Nasopharyngeal Swab  Result Value Ref Range Status   SARS Coronavirus 2 by RT PCR NEGATIVE NEGATIVE Final     Comment: (NOTE) SARS-CoV-2 target nucleic acids are NOT DETECTED.  The SARS-CoV-2 RNA is generally detectable in upper respiratoy specimens during the acute phase of infection. The lowest concentration of SARS-CoV-2 viral copies this assay can detect is 131 copies/mL. A negative result does not preclude SARS-Cov-2 infection and should not be used as the sole basis for treatment or other patient management decisions. A negative result may occur with  improper specimen collection/handling, submission of specimen other than nasopharyngeal swab, presence of viral mutation(s) within the areas targeted by this assay, and inadequate number of viral copies (<131 copies/mL). A negative result must be combined with clinical observations, patient history, and epidemiological information. The expected result is Negative.  Fact Sheet for Patients:  PinkCheek.be  Fact Sheet for Healthcare Providers:  GravelBags.it  This test is no t yet approved or cleared by the [place] FDA and  has been authorized for detection and/or diagnosis of SARS-CoV-2 by FDA under an Emergency Use Authorization (EUA). This EUA will remain  in effect (meaning this test can be used) for the duration of the COVID-19 declaration under Section 564(b)(1) of the Act, 21 U.S.C. section 360bbb-3(b)(1), unless the authorization is terminated or revoked sooner.     Influenza A by PCR NEGATIVE NEGATIVE Final   Influenza B by PCR NEGATIVE NEGATIVE Final    Comment: (NOTE) The Xpert Xpress SARS-CoV-2/FLU/RSV assay is intended as an aid in  the diagnosis of influenza from Nasopharyngeal swab specimens and  should not be used as a sole basis for treatment. Nasal washings and  aspirates are unacceptable for Xpert Xpress SARS-CoV-2/FLU/RSV  testing.  Fact Sheet for Patients: PinkCheek.be  Fact Sheet for Healthcare  Providers: GravelBags.it  This test is not yet approved or cleared by the [place] FDA and  has been authorized for detection and/or diagnosis of SARS-CoV-2 by  FDA under an Emergency Use Authorization (EUA). This EUA will remain  in effect (meaning this test can be used) for the duration of the  Covid-19 declaration under Section 564(b)(1) of the Act, 21  U.S.C. section 360bbb-3(b)(1), unless the authorization is  terminated or revoked. Performed at [facility], [place] [address] [place], [place] [address]   Blood Cultures x 2 sites  Status: None   Collection Time: 09/08/20  7:04 PM   Specimen: BLOOD  Result Value Ref Range Status   Specimen Description   Final    BLOOD LEFT WRIST Performed at [facility] [address], [place], [place] [address]    Special Requests   Final    BOTTLES DRAWN AEROBIC AND ANAEROBIC Blood Culture adequate volume Performed at [facility] [address], [place], [place] [address]    Culture   Final    NO GROWTH 5 DAYS Performed at [facility], [place] [address], [place], [place] [address]    Report Status 09/13/2020 FINAL  Final  Culture, blood (x 2)     Status: None   Collection Time: 09/08/20  9:03 PM   Specimen: BLOOD LEFT HAND  Result Value Ref Range Status   Specimen Description   Final    BLOOD LEFT HAND Performed at [facility] [address] [place], [place] [address]    Special Requests   Final    BOTTLES DRAWN AEROBIC AND ANAEROBIC Blood Culture adequate volume Performed at [facility] [address], [place], [place] [address]    Culture   Final    NO GROWTH 5 DAYS Performed at [facility], [place] [address] [place], [place] [address]    Report Status 09/13/2020 FINAL  Final  SARS Coronavirus 2 by RT PCR (hospital order, performed in [facility] hospital lab) Nasopharyngeal  Nasopharyngeal Swab     Status: Abnormal   Collection Time: 09/27/20 10:03 AM   Specimen: Nasopharyngeal Swab  Result Value Ref Range Status   SARS Coronavirus 2 POSITIVE (A) NEGATIVE Final    Comment: RESULT CALLED TO, READ BACK BY AND VERIFIED WITH: RN C APPLLEWHITE [id number] AT 8299 BY CM (NOTE) SARS-CoV-2 target nucleic acids are DETECTED  SARS-CoV-2 RNA is generally detectable in upper respiratory specimens  during the acute phase of infection.  Positive results are indicative  of the presence of the identified virus, but do not rule out bacterial infection or co-infection with other pathogens not detected by the test.  Clinical correlation with patient history and  other diagnostic information is necessary to determine patient infection status.  The expected result is negative.  Fact Sheet for Patients:   StrictlyIdeas.no   Fact Sheet for Healthcare Providers:   BankingDealers.co.za    This test is not yet approved or cleared by the [place] FDA and  has been authorized for detection and/or diagnosis of SARS-CoV-2 by FDA under an Emergency Use Authorization (EUA).  This EUA will remain in effect (meaning this  test can be used) for the duration of  the COVID-19 declaration under Section 564(b)(1) of the Act, 21 U.S.C. section 360-bbb-3(b)(1), unless the authorization is terminated or revoked sooner.  Performed at [facility], [place] [address], [place], [place] [address]   Resp Panel by RT PCR (RSV, Flu A&B, Covid) - Nasopharyngeal Swab     Status: Abnormal   Collection Time: 09/27/20 12:29 PM   Specimen: Nasopharyngeal Swab  Result Value Ref Range Status   SARS Coronavirus 2 by RT PCR POSITIVE (A) NEGATIVE Final    Comment: RESULT CALLED TO, READ BACK BY AND VERIFIED WITH: RN R AUGUSTUS [id number] AT 1404 BY CM (NOTE) SARS-CoV-2 target nucleic acids are DETECTED.  SARS-CoV-2 RNA is generally detectable in upper  respiratory specimens  during the acute phase of infection. Positive results are indicative of  the presence of the identified virus, but do not rule out bacterial infection or co-infection with other pathogens not detected by the test. Clinical correlation with patient history and other diagnostic information is necessary to determine patient infection status. The expected result is Negative.  Fact Sheet for Patients:  PinkCheek.be  Fact Sheet for Healthcare Providers: GravelBags.it  This test is not yet approved or cleared by the [place] FDA and  has been authorized for detection and/or diagnosis of SARS-CoV-2 by FDA under an Emergency Use Authorization (EUA).  This EUA will remain in effect (meaning this test can be  used) for the duration of  the COVID-19 declaration under Section 564(b)(1) of the Act, 21 U.S.C. section 360bbb-3(b)(1), unless the authorization is terminated or revoked sooner.      Influenza A by PCR NEGATIVE NEGATIVE Final   Influenza B by PCR NEGATIVE NEGATIVE Final    Comment: (NOTE) The Xpert Xpress SARS-CoV-2/FLU/RSV assay is intended as an aid in  the diagnosis of influenza from Nasopharyngeal swab specimens and  should not be used as a sole basis for treatment. Nasal washings and  aspirates are unacceptable for Xpert Xpress SARS-CoV-2/FLU/RSV  testing.  Fact Sheet for Patients: PinkCheek.be  Fact Sheet for Healthcare Providers: GravelBags.it  This test is not yet approved or cleared by the [place] FDA and  has been authorized for detection and/or diagnosis of SARS-CoV-2 by  FDA under an Emergency Use Authorization (EUA). This EUA will remain  in effect (meaning this test can be used) for the duration of the  Covid-19 declaration under Section 564(b)(1) of the Act, 21  U.S.C. section 360bbb-3(b)(1), unless the authorization  is  terminated or revoked.    Respiratory Syncytial Virus by PCR NEGATIVE NEGATIVE Final    Comment: (NOTE) Fact Sheet for Patients: PinkCheek.be  Fact Sheet for Healthcare Providers: GravelBags.it  This test is not yet approved or cleared by the [place] FDA and  has been authorized for detection and/or diagnosis of SARS-CoV-2 by  FDA under an Emergency Use Authorization (EUA). This EUA will remain  in effect (meaning this test can be used) for the duration of the  COVID-19 declaration under Section 564(b)(1) of the Act, 21 U.S.C.  section 360bbb-3(b)(1), unless the authorization is terminated or  revoked. Performed at [facility], [place] [address], [place], [place] [address]   MRSA PCR Screening     Status: None   Collection Time: 09/28/20  8:30 AM   Specimen: Nasal Mucosa; Nasopharyngeal  Result Value Ref Range Status   MRSA by PCR NEGATIVE NEGATIVE Final    Comment:        The GeneXpert MRSA Assay (FDA approved for NASAL specimens only), is one component of a comprehensive MRSA colonization surveillance program. It is not intended to diagnose MRSA infection nor to guide or monitor treatment for MRSA infections. Performed at [facility], [place] [address], [place], [place] [address]     Coagulation Studies: No results for input(s): LABPROT, INR in the last 72 hours.  Urinalysis: No results for input(s): COLORURINE, LABSPEC, PHURINE, GLUCOSEU, HGBUR, BILIRUBINUR, KETONESUR, PROTEINUR, UROBILINOGEN, NITRITE, LEUKOCYTESUR in the last 72 hours.  Invalid input(s): APPERANCEUR    Imaging: No results found.   Medications:   . sodium chloride    . sodium chloride    . albumin human    . amiodarone 30 mg/hr (10/01/20 0732)  . famotidine (PEPCID) IV     . apixaban  5 mg Oral BID  .  Chlorhexidine Gluconate Cloth  6 each Topical Q0600  . doxycycline  100 mg Oral Q12H  . feeding supplement   237 mL Oral TID BM  . heparin sodium (porcine)      . insulin aspart  0-15 Units Subcutaneous TID WC  . insulin aspart  0-5 Units Subcutaneous QHS  . metoprolol tartrate  12.5 mg Oral BID  . multivitamin  1 tablet Oral QHS  . traZODone  25 mg Oral QHS   sodium chloride, sodium chloride, acetaminophen **OR** [DISCONTINUED] acetaminophen, albuterol, ALPRAZolam, alteplase, famotidine (PEPCID) IV, heparin, HYDROcodone-acetaminophen, lidocaine (PF), lidocaine, lidocaine-prilocaine, melatonin, metoprolol tartrate, ondansetron (ZOFRAN) IV, pentafluoroprop-tetrafluoroeth, sodium chloride flush  Assessment/ Plan:  1.Acute kidney injury on CKD stage IIIb: Baseline creatinine level 1.3-1.9.  AKI multifactorial etiology including cardiorenal syndrome, ATN.  Started dialysis on 11/3 and has been tolerating well.  Tried high-dose IV diuretics without response.   No sign of renal recovery.  We had a meeting in the presence of palliative care on 11/9 with the patient and his daughter. They agreed to dialysis and permanent access. RIJ TDC and left radiocephalic AV fistula created by Dr. Donnetta Hutching on 11/10. Marland KitchenHe has OP HD unit arranged at [facility] clinic Monday Wednesday Friday schedule COVID shift in the evening Status post HD on 09/30/2020 with 2 L UF, next dialysis will be 10/01/2020  2.Covid positive, fever: The Covid was positive while checked for placement.  Is now febrile and hypotensive.  Plan for Covid antibody infusion per primary team.  3.CHF with systolic and diastolic dysfunction: Volume overload causing cardiorenal syndrome.  Did not respond with IV diuretics therefore now volume management with dialysis. Discontinued diuretics.  4.Hypertension: On metoprolol for A. fib.   Some labile blood pressures noted does continue on midodrine may be able to decrease this dose to as needed with dialysis  5. Sepsis with osteomyelitis of fifth toe on the right foot: On antibiotics per primary  team.  6.Anemia due to sepsis, AKI: Hemoglobin at goal, iron saturation is low however I will hold IV iron because of infection and currently on antibiotics.  7.Metabolic acidosis: Managed with dialysis now.  8.Bleeding from tunnel catheter site: The temporary catheter is out.  No bleeding.  9.A. fib with RVR: No further bleeding, on BB and Eliquis.    LOS: [facility] @TODAY @7 :43 AM

## 2020-10-01 NOTE — Progress Notes (Addendum)
Pt signed [name] paperwork. All medicines stopped. Attempted to call POA [name] to come pick patient up, but no answer and voicemail box full.  1612: Pt left via wheelchair with all belongings. POA [name] here to pick up Pt. Paperwork delivered to [name] at time of discharge.

## 2020-10-02 ENCOUNTER — Telehealth: Payer: Self-pay

## 2020-10-02 ENCOUNTER — Telehealth: Payer: Self-pay | Admitting: Nephrology

## 2020-10-02 NOTE — Telephone Encounter (Signed)
Transition of care contact from inpatient facility  Date of Discharge: 10/01/20 Date of Contact: 11/18/210 Method of contact: Phone  Attempted to contact patient to discuss transition of care from inpatient admission. Patient did not answer the phone. Unable to leave VM.

## 2020-10-02 NOTE — Telephone Encounter (Signed)
Received phone call from Eustace with [facility]. Patient's daughter reached out to [facility] inquiring for patient to be evaluated for Hospice. Cindie to contact PCP

## 2020-10-03 ENCOUNTER — Telehealth: Payer: Self-pay | Admitting: Nephrology

## 2020-10-03 NOTE — Telephone Encounter (Signed)
Transition of care contact from inpatient facility  Date of Discharge: 10/01/20 Date of Contact: 10/03/20 Method of contact: Phone  Attempted to contact patient to discuss transition of care from inpatient admission. Patient did not answer the phone and unable to leave VM.

## 2020-10-15 DEATH — deceased

## 2020-10-21 ENCOUNTER — Other Ambulatory Visit: Payer: Self-pay

## 2020-10-21 DIAGNOSIS — N179 Acute kidney failure, unspecified: Secondary | ICD-10-CM

## 2020-10-21 DIAGNOSIS — N1831 Acute kidney failure, unspecified: Secondary | ICD-10-CM

## 2020-10-30 ENCOUNTER — Inpatient Hospital Stay (HOSPITAL_COMMUNITY): Admit: 2020-10-30 | Payer: Medicare Other

## 2021-10-31 IMAGING — US IR FLUORO GUIDE CV LINE*R*
1 series · 1 of 1 positions shown · non-contrast
Comparison: none

INDICATION: 69-year-old male with a history of renal failure

[Series 1: (id) · 1 of 1 slices shown]
[im 1/1]
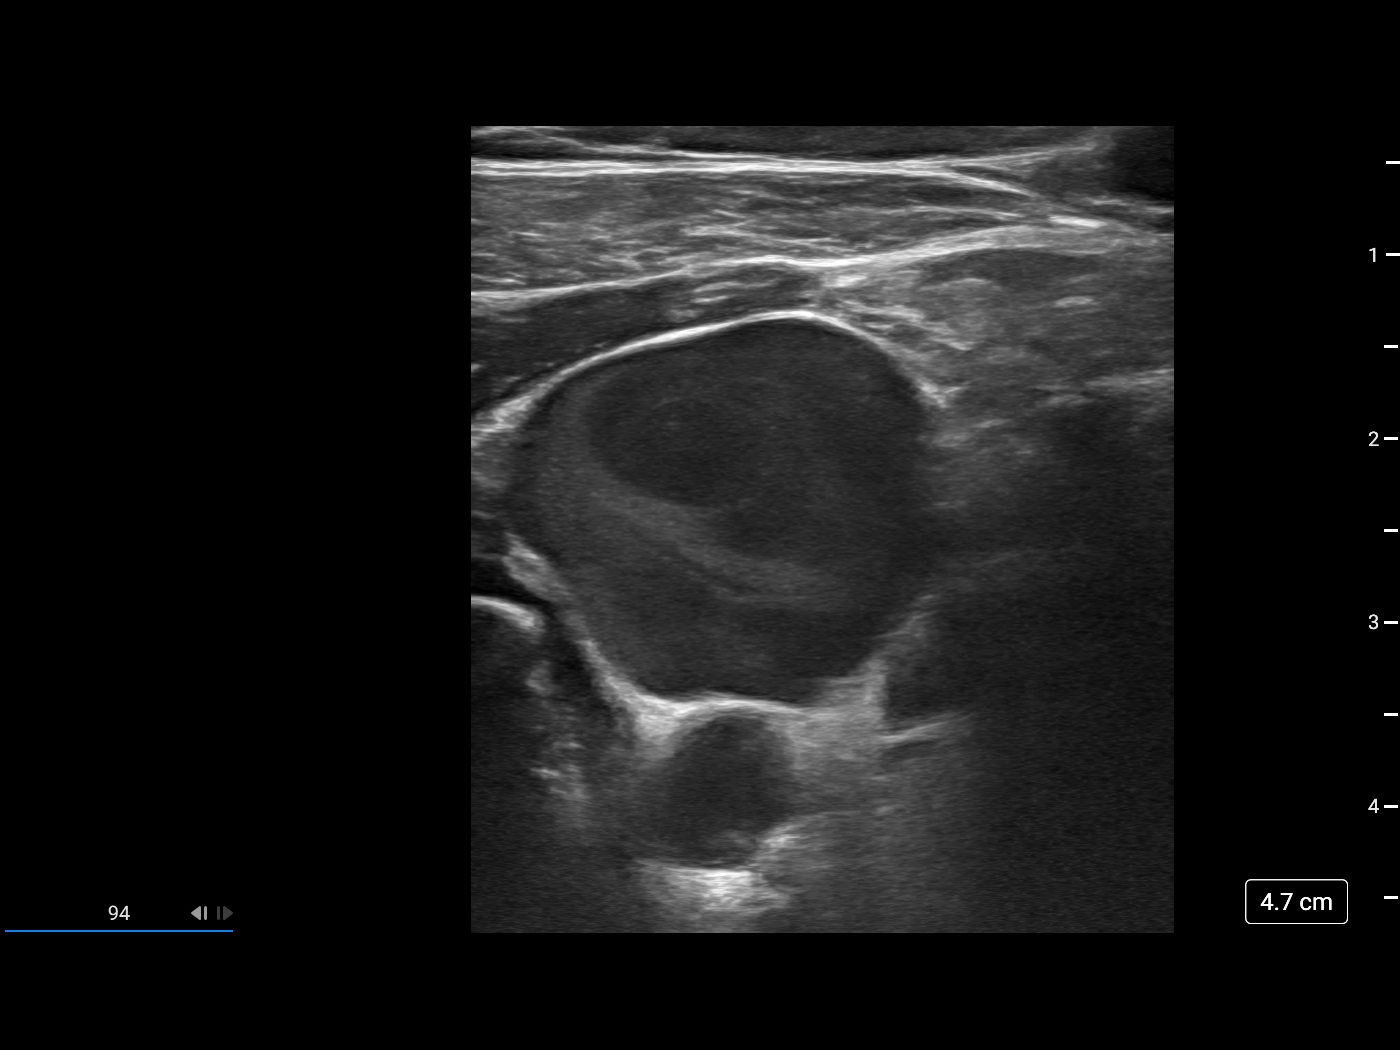

[1 of 1 positions shown; findings below may reference images not displayed]

EXAM:
IMAGE GUIDED PLACEMENT OF NON TUNNELED HEMODIALYSIS CATHETER

MEDICATIONS:
None

ANESTHESIA/SEDATION:
None

FLUOROSCOPY TIME:  Fluoroscopy Time: 1 minutes 42 seconds (52 mGy).

COMPLICATIONS:
None

PROCEDURE:
Informed written consent was obtained from the patient and the
patient's family after a thorough discussion of the procedural
risks, benefits and alternatives. All questions were addressed. A
timeout was performed prior to the initiation of the procedure.

The right neck and chest was prepped with chlorhexidine, and draped
in the usual sterile fashion using maximum barrier technique (cap
and mask, sterile gown, sterile gloves, large sterile sheet, hand
hygiene and cutaneous antiseptic). Local anesthesia was attained by
infiltration with 1% lidocaine without epinephrine.

Ultrasound demonstrated patency of the right internal jugular vein,
and this was documented with an image. Under real-time ultrasound
guidance, this vein was accessed with a 21 gauge micropuncture
needle and image documentation was performed. A small dermatotomy
was made at the access site with an 11 scalpel. A 0.018" wire was
advanced into the SVC and the access needle exchanged for a 4F
micropuncture vascular sheath. The 0.018" wire was then removed and
a 0.035" wire advanced into the IVC.

A 20 cm catheter was selected. Skin and subcutaneous tissues were
serially dilated. Catheter was placed on the wire.

The catheter tip is positioned in the upper right atrium. This was
documented with a spot image. Both ports of the hemodialysis
catheter were then tested for excellent function. The ports were
then locked with heparinized lock.

Patient tolerated the procedure well and remained hemodynamically
stable throughout.

No complications were encountered and no significant blood loss was
encountered.
IMPRESSION: Status post image guided placement temporary hemodialysis catheter
via right IJ approach

## 2021-11-08 IMAGING — DX DG CHEST 1V
1 series · 1 of 1 positions shown · non-contrast
Comparison: Chest radiograph dated 09/12/2020

CLINICAL DATA: 69-year-old male with central line placement.

EXAM:
CHEST  1 VIEW

[chest ap]
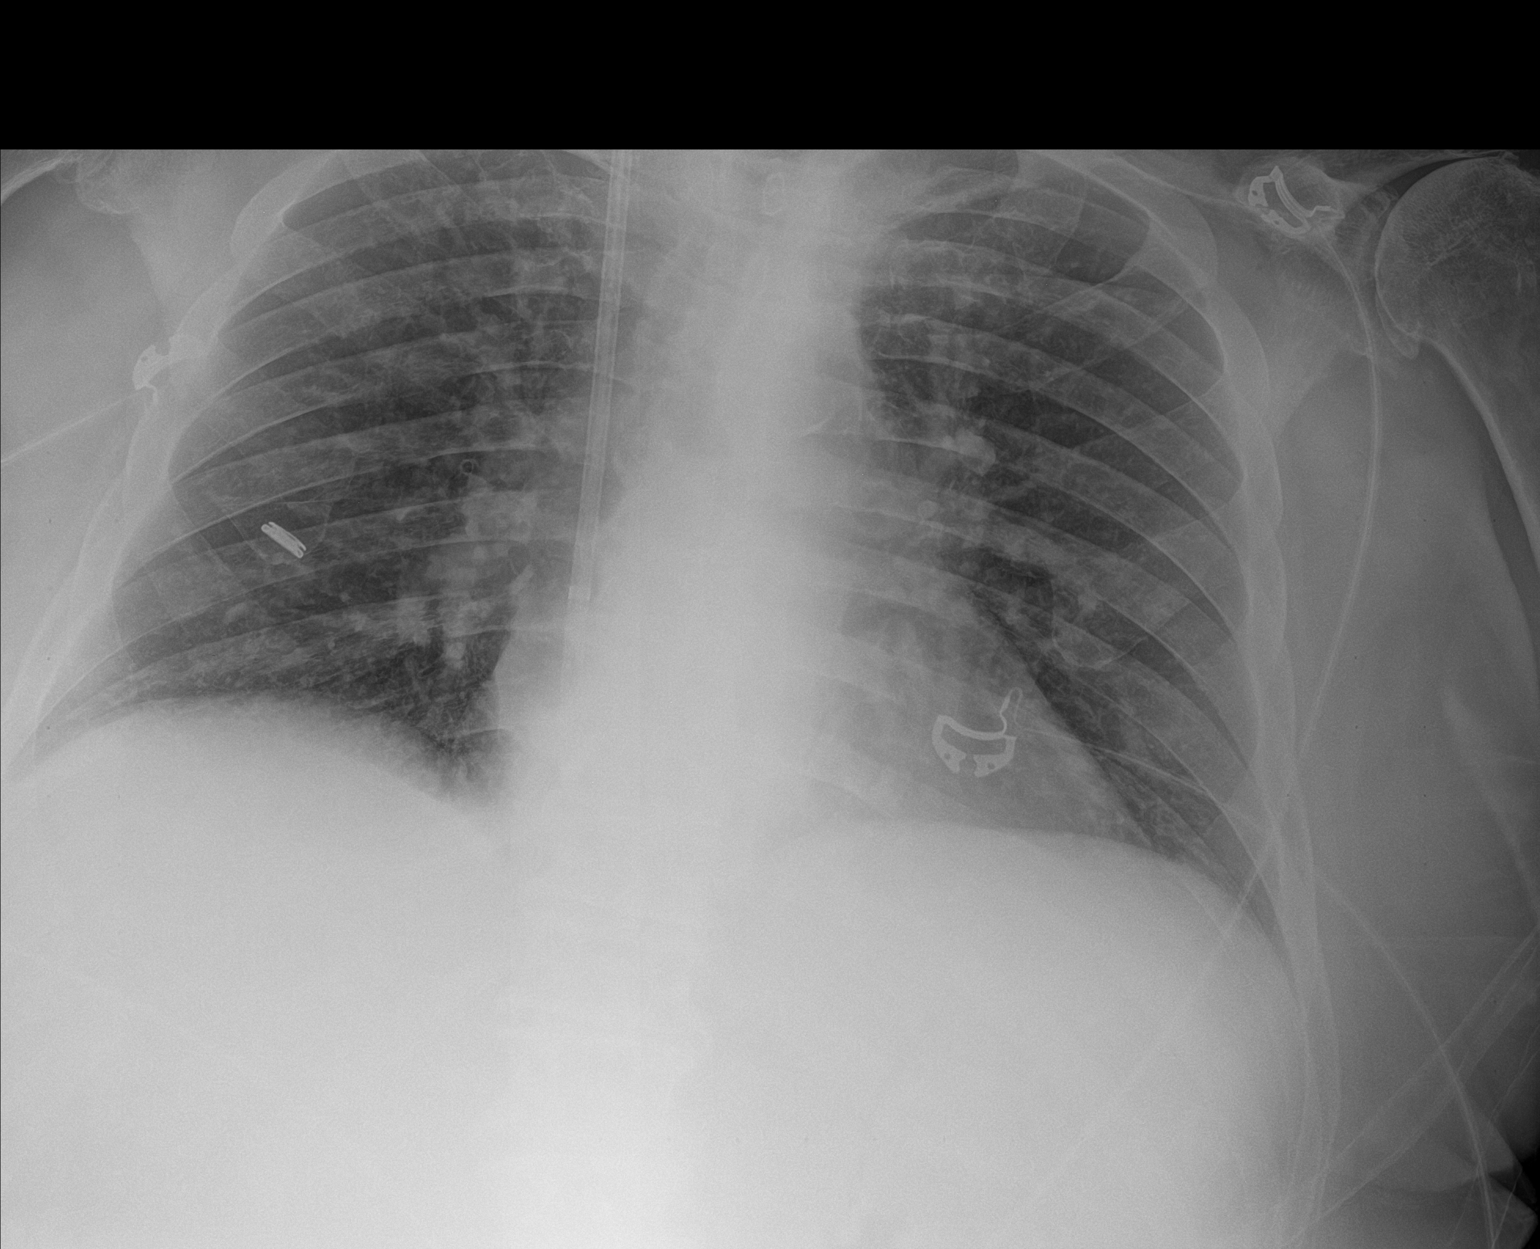

[1 of 1 positions shown; findings below may reference images not displayed]

FINDINGS: Right IJ central venous line with tip close to the cavoatrial
junction. No pneumothorax. There is no focal consolidation, or
pleural effusion. The cardiac silhouette is within limits.
Atherosclerotic calcification of the aorta. There is mild vascular
congestion. No acute osseous pathology.
IMPRESSION: 1. Right IJ central venous line with tip close to the cavoatrial
junction. No pneumothorax.
2. Mild vascular congestion.

## 2021-11-12 IMAGING — DX DG CHEST 1V PORT
1 series · 1 of 1 positions shown · non-contrast
Comparison: September 24, 2020

CLINICAL DATA: Shortness of breath.  [id number] positive

EXAM:
PORTABLE CHEST 1 VIEW

[chest]
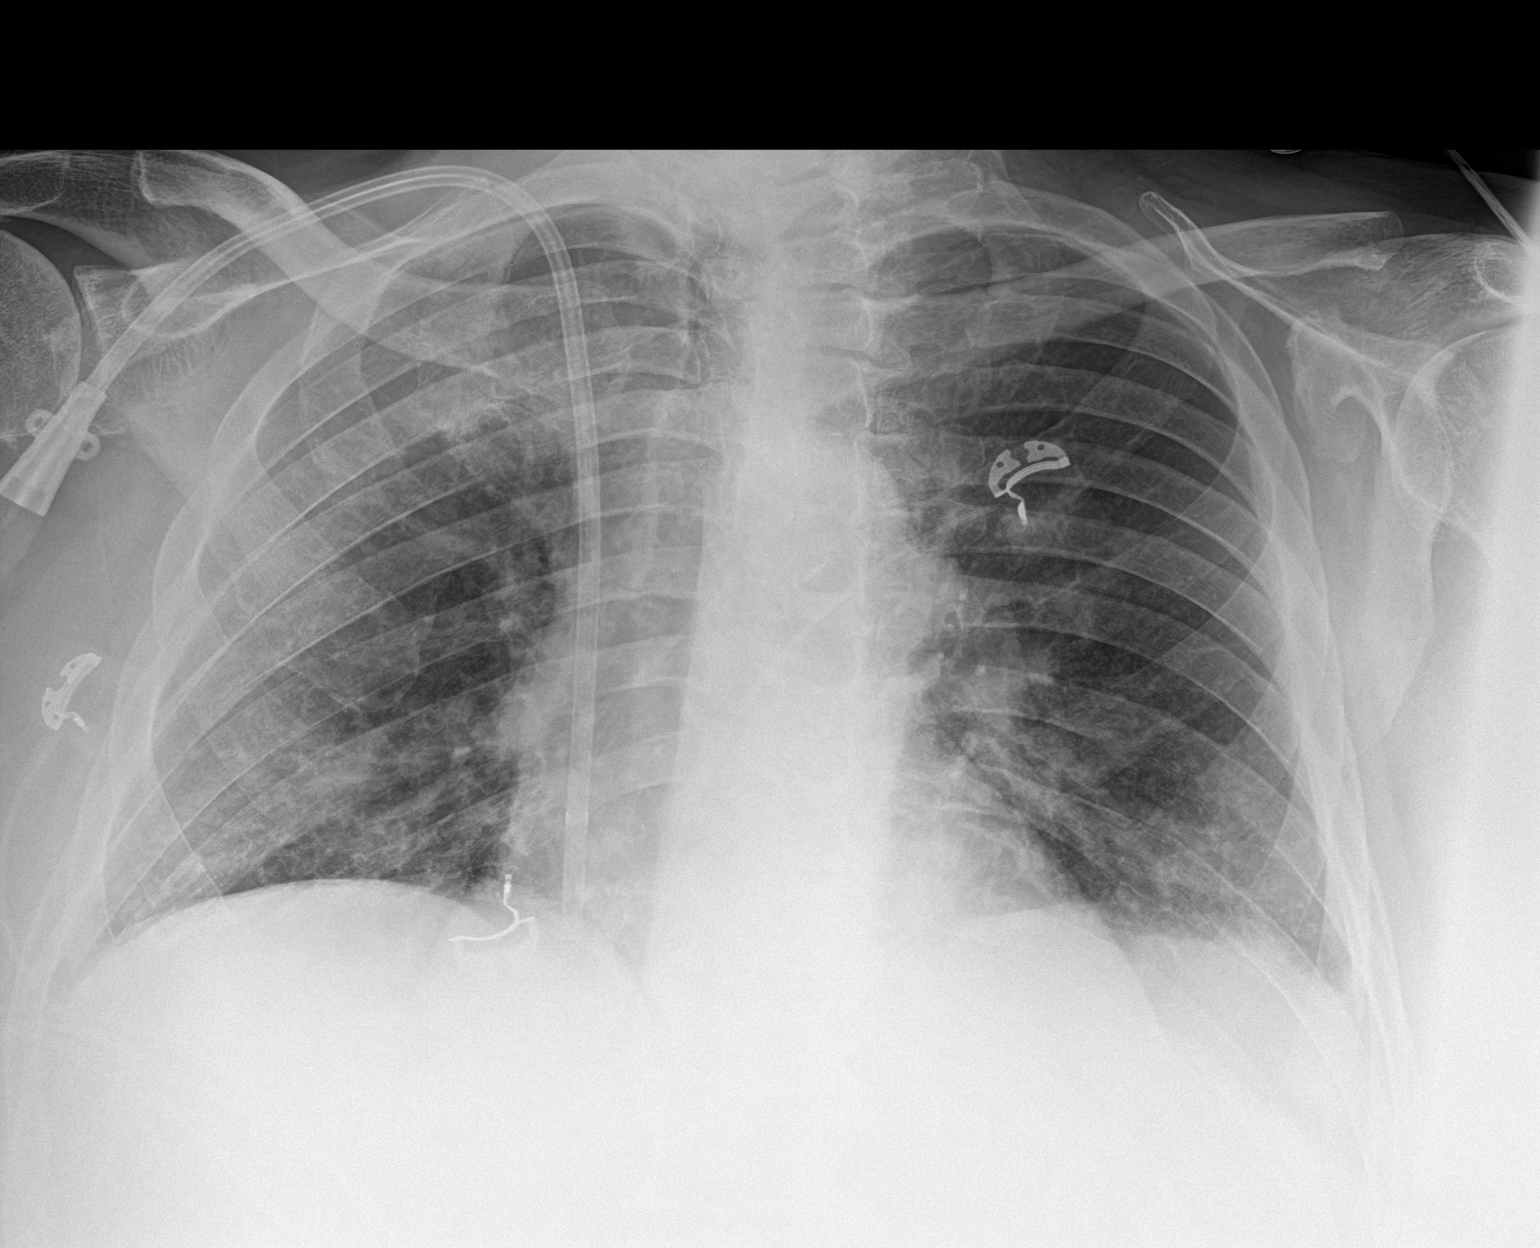

[1 of 1 positions shown; findings below may reference images not displayed]

FINDINGS: Central catheter tip is in the right atrium without pneumothorax.
There is patchy airspace opacity in the right mid lung and lung base
regions, slightly increased from most recent study. Heart size and
pulmonary vascularity are normal. No adenopathy. There is aortic
atherosclerosis. Evidence of prior surgery or trauma involving each
lateral clavicle noted.
IMPRESSION: Patchy airspace opacity in the right mid lung and both base regions,
likely due to atypical organism pneumonia. No consolidation.
Increased opacity compared to most recent chest radiograph. Stable
cardiac silhouette. Central catheter tip in right atrium without
pneumothorax.

Aortic Atherosclerosis (OMOGA-AQ7.7).
